# Patient Record
Sex: Male | Born: 1960 | Race: White | Hispanic: No | Marital: Married | Smoking: Former smoker
Health system: Southern US, Community
[De-identification: ages and names within clinical notes are randomized; demographics above are authoritative.]

## PROBLEM LIST (undated history)

## (undated) DIAGNOSIS — E785 Hyperlipidemia, unspecified: Secondary | ICD-10-CM

## (undated) DIAGNOSIS — J309 Allergic rhinitis, unspecified: Secondary | ICD-10-CM

## (undated) DIAGNOSIS — M1990 Unspecified osteoarthritis, unspecified site: Secondary | ICD-10-CM

## (undated) DIAGNOSIS — I2699 Other pulmonary embolism without acute cor pulmonale: Secondary | ICD-10-CM

## (undated) DIAGNOSIS — N400 Enlarged prostate without lower urinary tract symptoms: Secondary | ICD-10-CM

## (undated) DIAGNOSIS — T4145XA Adverse effect of unspecified anesthetic, initial encounter: Secondary | ICD-10-CM

## (undated) DIAGNOSIS — N289 Disorder of kidney and ureter, unspecified: Secondary | ICD-10-CM

## (undated) DIAGNOSIS — M545 Low back pain, unspecified: Secondary | ICD-10-CM

## (undated) DIAGNOSIS — E663 Overweight: Secondary | ICD-10-CM

## (undated) DIAGNOSIS — I10 Essential (primary) hypertension: Secondary | ICD-10-CM

## (undated) DIAGNOSIS — K3184 Gastroparesis: Secondary | ICD-10-CM

## (undated) DIAGNOSIS — K219 Gastro-esophageal reflux disease without esophagitis: Secondary | ICD-10-CM

## (undated) DIAGNOSIS — T8859XA Other complications of anesthesia, initial encounter: Secondary | ICD-10-CM

## (undated) DIAGNOSIS — L509 Urticaria, unspecified: Secondary | ICD-10-CM

## (undated) DIAGNOSIS — E119 Type 2 diabetes mellitus without complications: Secondary | ICD-10-CM

## (undated) DIAGNOSIS — Z794 Long term (current) use of insulin: Secondary | ICD-10-CM

## (undated) DIAGNOSIS — R51 Headache: Secondary | ICD-10-CM

## (undated) DIAGNOSIS — G63 Polyneuropathy in diseases classified elsewhere: Secondary | ICD-10-CM

## (undated) DIAGNOSIS — E349 Endocrine disorder, unspecified: Secondary | ICD-10-CM

## (undated) DIAGNOSIS — M542 Cervicalgia: Secondary | ICD-10-CM

## (undated) DIAGNOSIS — Q603 Renal hypoplasia, unilateral: Secondary | ICD-10-CM

## (undated) DIAGNOSIS — E1149 Type 2 diabetes mellitus with other diabetic neurological complication: Secondary | ICD-10-CM

## (undated) DIAGNOSIS — I739 Peripheral vascular disease, unspecified: Secondary | ICD-10-CM

## (undated) DIAGNOSIS — M797 Fibromyalgia: Secondary | ICD-10-CM

## (undated) DIAGNOSIS — Z973 Presence of spectacles and contact lenses: Secondary | ICD-10-CM

## (undated) DIAGNOSIS — I639 Cerebral infarction, unspecified: Secondary | ICD-10-CM

## (undated) DIAGNOSIS — Z87442 Personal history of urinary calculi: Secondary | ICD-10-CM

## (undated) DIAGNOSIS — N183 Chronic kidney disease, stage 3 unspecified: Secondary | ICD-10-CM

## (undated) DIAGNOSIS — IMO0001 Reserved for inherently not codable concepts without codable children: Secondary | ICD-10-CM

## (undated) DIAGNOSIS — I209 Angina pectoris, unspecified: Secondary | ICD-10-CM

## (undated) HISTORY — DX: Hyperlipidemia, unspecified: E78.5

## (undated) HISTORY — PX: SHOULDER ARTHROSCOPY W/ ROTATOR CUFF REPAIR: SHX2400

## (undated) HISTORY — DX: Essential (primary) hypertension: I10

## (undated) HISTORY — DX: Other pulmonary embolism without acute cor pulmonale: I26.99

## (undated) HISTORY — DX: Type 2 diabetes mellitus without complications: E11.9

## (undated) HISTORY — PX: OTHER SURGICAL HISTORY: SHX169

## (undated) HISTORY — PX: APPENDECTOMY: SHX54

## (undated) HISTORY — DX: Overweight: E66.3

## (undated) HISTORY — DX: Reserved for inherently not codable concepts without codable children: IMO0001

## (undated) HISTORY — DX: Urticaria, unspecified: L50.9

## (undated) HISTORY — DX: Allergic rhinitis, unspecified: J30.9

## (undated) HISTORY — PX: NISSEN FUNDOPLICATION: SHX2091

## (undated) HISTORY — DX: Gastro-esophageal reflux disease without esophagitis: K21.9

## (undated) HISTORY — DX: Type 2 diabetes mellitus with other diabetic neurological complication: E11.49

## (undated) HISTORY — DX: Low back pain: M54.5

## (undated) HISTORY — DX: Long term (current) use of insulin: Z79.4

## (undated) HISTORY — DX: Low back pain, unspecified: M54.50

## (undated) HISTORY — DX: Gastroparesis: K31.84

## (undated) HISTORY — DX: Cervicalgia: M54.2

---
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
## 1997-02-03 HISTORY — PX: BACK SURGERY: SHX140

## 1999-02-14 ENCOUNTER — Encounter: Payer: Self-pay | Admitting: Neurosurgery

## 1999-02-14 ENCOUNTER — Ambulatory Visit (HOSPITAL_COMMUNITY): Admission: RE | Admit: 1999-02-14 | Discharge: 1999-02-14 | Payer: Self-pay | Admitting: Neurosurgery

## 1999-03-06 ENCOUNTER — Encounter: Payer: Self-pay | Admitting: Neurosurgery

## 1999-03-06 ENCOUNTER — Ambulatory Visit (HOSPITAL_COMMUNITY): Admission: RE | Admit: 1999-03-06 | Discharge: 1999-03-06 | Payer: Self-pay | Admitting: Neurosurgery

## 1999-03-20 ENCOUNTER — Encounter: Payer: Self-pay | Admitting: Neurosurgery

## 1999-03-22 ENCOUNTER — Encounter: Payer: Self-pay | Admitting: Neurosurgery

## 1999-03-22 ENCOUNTER — Inpatient Hospital Stay (HOSPITAL_COMMUNITY): Admission: RE | Admit: 1999-03-22 | Discharge: 1999-03-24 | Payer: Self-pay | Admitting: Neurosurgery

## 2000-10-22 ENCOUNTER — Ambulatory Visit (HOSPITAL_COMMUNITY): Admission: RE | Admit: 2000-10-22 | Discharge: 2000-10-22 | Payer: Self-pay | Admitting: Internal Medicine

## 2001-10-20 ENCOUNTER — Encounter: Admission: RE | Admit: 2001-10-20 | Discharge: 2001-10-20 | Payer: Self-pay | Admitting: Preventative Medicine

## 2001-10-20 ENCOUNTER — Encounter: Payer: Self-pay | Admitting: Preventative Medicine

## 2002-01-12 ENCOUNTER — Encounter: Payer: Self-pay | Admitting: Neurosurgery

## 2002-01-13 ENCOUNTER — Inpatient Hospital Stay (HOSPITAL_COMMUNITY): Admission: RE | Admit: 2002-01-13 | Discharge: 2002-01-15 | Payer: Self-pay | Admitting: Neurosurgery

## 2002-11-22 ENCOUNTER — Ambulatory Visit (HOSPITAL_COMMUNITY): Admission: RE | Admit: 2002-11-22 | Discharge: 2002-11-22 | Payer: Self-pay | Admitting: Otolaryngology

## 2002-11-22 ENCOUNTER — Encounter: Payer: Self-pay | Admitting: Otolaryngology

## 2002-12-16 ENCOUNTER — Encounter: Admission: RE | Admit: 2002-12-16 | Discharge: 2002-12-16 | Payer: Self-pay | Admitting: Neurosurgery

## 2003-10-05 DIAGNOSIS — R079 Chest pain, unspecified: Secondary | ICD-10-CM | POA: Insufficient documentation

## 2003-11-01 ENCOUNTER — Ambulatory Visit (HOSPITAL_COMMUNITY): Admission: RE | Admit: 2003-11-01 | Discharge: 2003-11-01 | Payer: Self-pay | Admitting: Cardiology

## 2004-12-08 ENCOUNTER — Emergency Department (HOSPITAL_COMMUNITY): Admission: EM | Admit: 2004-12-08 | Discharge: 2004-12-08 | Payer: Self-pay | Admitting: Emergency Medicine

## 2006-03-10 ENCOUNTER — Ambulatory Visit: Payer: Self-pay | Admitting: Cardiology

## 2006-08-20 ENCOUNTER — Emergency Department (HOSPITAL_COMMUNITY): Admission: EM | Admit: 2006-08-20 | Discharge: 2006-08-21 | Payer: Self-pay | Admitting: Emergency Medicine

## 2006-12-24 ENCOUNTER — Ambulatory Visit: Payer: Self-pay | Admitting: Pulmonary Disease

## 2006-12-28 ENCOUNTER — Ambulatory Visit: Admission: RE | Admit: 2006-12-28 | Discharge: 2006-12-28 | Payer: Self-pay | Admitting: Pulmonary Disease

## 2007-01-04 ENCOUNTER — Telehealth (INDEPENDENT_AMBULATORY_CARE_PROVIDER_SITE_OTHER): Payer: Self-pay | Admitting: *Deleted

## 2007-01-07 ENCOUNTER — Encounter: Payer: Self-pay | Admitting: Pulmonary Disease

## 2007-01-08 ENCOUNTER — Ambulatory Visit: Payer: Self-pay | Admitting: Pulmonary Disease

## 2007-01-14 ENCOUNTER — Encounter (INDEPENDENT_AMBULATORY_CARE_PROVIDER_SITE_OTHER): Payer: Self-pay | Admitting: *Deleted

## 2007-01-24 ENCOUNTER — Emergency Department (HOSPITAL_COMMUNITY): Admission: EM | Admit: 2007-01-24 | Discharge: 2007-01-24 | Payer: Self-pay | Admitting: Emergency Medicine

## 2007-01-26 ENCOUNTER — Ambulatory Visit: Payer: Self-pay | Admitting: Pulmonary Disease

## 2007-05-21 ENCOUNTER — Emergency Department (HOSPITAL_COMMUNITY): Admission: EM | Admit: 2007-05-21 | Discharge: 2007-05-22 | Payer: Self-pay | Admitting: Emergency Medicine

## 2007-05-23 ENCOUNTER — Emergency Department (HOSPITAL_COMMUNITY): Admission: EM | Admit: 2007-05-23 | Discharge: 2007-05-24 | Payer: Self-pay | Admitting: Emergency Medicine

## 2007-05-30 ENCOUNTER — Inpatient Hospital Stay (HOSPITAL_COMMUNITY): Admission: EM | Admit: 2007-05-30 | Discharge: 2007-06-01 | Payer: Self-pay | Admitting: Emergency Medicine

## 2007-05-30 ENCOUNTER — Ambulatory Visit: Payer: Self-pay | Admitting: Internal Medicine

## 2007-05-31 ENCOUNTER — Ambulatory Visit: Payer: Self-pay | Admitting: Internal Medicine

## 2007-07-05 ENCOUNTER — Emergency Department (HOSPITAL_COMMUNITY): Admission: EM | Admit: 2007-07-05 | Discharge: 2007-07-05 | Payer: Self-pay | Admitting: Emergency Medicine

## 2007-08-31 ENCOUNTER — Emergency Department (HOSPITAL_COMMUNITY): Admission: EM | Admit: 2007-08-31 | Discharge: 2007-08-31 | Payer: Self-pay | Admitting: Emergency Medicine

## 2008-12-05 ENCOUNTER — Ambulatory Visit: Payer: Self-pay | Admitting: Cardiology

## 2009-12-10 ENCOUNTER — Encounter: Payer: Self-pay | Admitting: Cardiology

## 2009-12-12 ENCOUNTER — Ambulatory Visit: Payer: Self-pay | Admitting: Cardiology

## 2009-12-12 ENCOUNTER — Encounter: Payer: Self-pay | Admitting: Cardiology

## 2009-12-13 ENCOUNTER — Encounter: Payer: Self-pay | Admitting: Cardiology

## 2010-01-14 ENCOUNTER — Ambulatory Visit: Payer: Self-pay | Admitting: Cardiology

## 2010-01-14 DIAGNOSIS — K219 Gastro-esophageal reflux disease without esophagitis: Secondary | ICD-10-CM | POA: Insufficient documentation

## 2010-03-07 NOTE — Consult Note (Signed)
Summary: CARDIOLOGY CONSULT/ [facility]/ [facility]   Imported By: Delfino Lovett 01/14/2010 11:00:42  _____________________________________________________________________  External Attachment:    Type:   Image     Comment:   External Document

## 2010-03-07 NOTE — Letter (Signed)
Summary: [facility] D/C DR. HOWARD  [facility] D/C DR. HOWARD   Imported By: Delfino Lovett 01/14/2010 13:37:21  _____________________________________________________________________  External Attachment:    Type:   Image     Comment:   External Document
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
## 2010-06-18 NOTE — Procedures (Signed)
NAME:  [name], [name]                  ACCOUNT NO.:  x   MEDICAL RECORD NO.:  [id number]          PATIENT TYPE:  OUT   LOCATION:  SLEEP LAB                     FACILITY:  [facility]   PHYSICIAN:  Kathee Delton, MD,FCCPDATE OF BIRTH:  01[birth year]19/60   DATE OF STUDY:  12/28/2006                            NOCTURNAL POLYSOMNOGRAM   REFERRING PHYSICIAN:  Kathee Delton, MD,FCCP   INDICATION FOR STUDY:  Hypersomnia with sleep apnea.   EPWORTH SLEEPINESS SCORE:  13   MEDICATIONS:   SLEEP ARCHITECTURE:  The patient had a total sleep time of 323 minutes  with decreased slow wave sleep as well as REM.  Sleep onset latency was  normal, and REM onset was mildly prolonged.  Sleep efficiency was  decreased at 87%.   RESPIRATORY DATA:  The patient was found to have two obstructive apneas  and 18 hypopneas for an apnea/hypopnea index of 3.7 events per hour.  The events occurred more commonly during REM, and there was loud snoring  noted throughout.  The events were not positional.   OXYGEN DATA:  There was O2 desaturation as low as 88% with the patient's  obstructive events.   CARDIAC DATA:  No clinically significant arrhythmias were noted.   MOVEMENT-PARASOMNIA:  None.   IMPRESSIONS-RECOMMENDATIONS:  1. Small numbers of obstructive events which do not meet the      apnea/hypopnea index criteria for the obstructive sleep apnea      syndrome.  2. No movement disorder of sleep or abnormal behaviors were noted      throughout the night.     Kathee Delton, MD,FCCP  Diplomate, [place] Board of Sleep  Medicine  Electronically Signed    [facility]/MED[facility]  D:  01/09/2007 12:13:27  T:  01/10/2007 10:11:57  Job:  [id number]

## 2010-06-18 NOTE — H&P (Signed)
[name], [name]              ACCOUNT NO.:  [id number]   MEDICAL RECORD NO.:  [id number]          PATIENT TYPE:  INP   LOCATION:  A302                          FACILITY:  [facility]   PHYSICIAN:  Marlene Bast, MDDATE OF BIRTH:  July 22, 1960   DATE OF ADMISSION:  05/29/2007  DATE OF DISCHARGE:  LH                              HISTORY & PHYSICAL   CHIEF COMPLAINT:  Vomiting blood.   HISTORY OF PRESENT ILLNESS:  [name] is a 50 year old gentleman,  who presented to the emergency department after vomiting blood 3 times  on the day of arrival.  He actually vomited blood even one time the day  before that.  He also has had trouble with intermittent abdominal pain  for about 3 weeks.  He denies any diarrhea or constipation.  No bright  red blood per rectum, no fevers, no chest pain, and no shortness of  breath.  The abdominal pain is midline, high in the abdomen, almost in  the epigastric area, in the area of scar that he had from his previous  acid reflux surgery that is where the pain is most of time, it is  intermittent and not constant.  He also has some pain in the lower  abdomen at times.   PAST MEDICAL HISTORY:  Significant for diabetes, hypertension, obesity,  and hyperlipidemia.  He has history of surgery for severe acid reflux  approximately 6 years ago;  that was at [facility] in [place], and  he has history of back surgery at [facility] in 2003 and that was for L4-L5  lumbar spondylolisthesis with radiculopathy.  He had a cardiac  catheterization in September 2005, which revealed no significant  coronary artery disease.  The patient also tells me he had a sleep  study, which was negative for any sleep apnea.   MEDICATIONS ON ARRIVAL PER THE ED RECORDS:  Include:  1. Crestor once daily.  2. Allegra once daily.  3. Actos twice daily.  4. Metformin twice daily.  5. Lisinopril/hydrochlorothiazide and insulin, though we do not know      the doses.   FAMILY HISTORY:   Significant for hypertension.   SOCIAL HISTORY:  He does not smoke cigarettes, drink alcohol, or use any  illicit drugs.   REVIEW OF SYSTEMS:  CONSTITUTIONAL:  He denies any high fevers or night  sweats.  HEENT:  No significant headaches or sore throat.  CARDIOVASCULAR:  No chest pain or lower extremity edema.  RESPIRATORY:  He denies any shortness of breath or wheezing.  No hemoptysis.  GI:  He  has vomited blood 4 times and has the abdominal pain, but no diarrhea or  constipation.  GU:  He denies any urinary difficulty.  NEUROLOGIC:  He  denies any paresthesias, any asymmetric weakness, no seizures.  All  other systems reviewed and are negative.   PHYSICAL EXAMINATION:  In the emergency department, his temperature was  99.0, blood pressure 131/86, pulse 89, respiratory rate 20, and O2  saturation is 96% on room air.  On physical examination, the patient is in no acute distress.  HEENT Exam:  Normocephalic and atraumatic.  His pupils are equal and  round.  Sclerae nonicteric.  Oral mucosa moist.  NECK: Supple.  No lymphadenopathy, no thyromegaly, no jugular venous  distention.  CARDIOVASCULAR Examination: Regular rate and rhythm.  No murmurs are  appreciated.  LUNGS: Clear to auscultation with no wheezes, rhonchi, or rales.  ABDOMEN: Obese and nontender at the time of my examination.  No  hepatosplenomegaly.  He does have a vertical scar in the epigastric  region, which is well healed, but there is some laxity in the skin  around that scar and so he may have a small hernia, but it was nontender  when I palpated the area.  He has no rebound or guarding at this point.  EXTREMITIES: Revealed no evidence of clubbing, cyanosis, or pitting  edema.  His DP pulses are faint, but palpable.  NEUROLOGIC: He is alert and oriented x3.  His cranial nerves II through  XII are intact.  Grossly, he has 5/5 strength in his upper and lower  extremities.  His sensory exam is intact grossly in his upper  and lower  extremities.  He has normal muscle tone and bulk.  MUSCULOSKELETAL: Reveals no evidence of effusion of his joints and no  synovitis.   LABORATORY DATA:  The patient's white count is 9.4, hemoglobin 14.3,  hematocrit 41.2, and platelet count is 169,000.  His INR is 1.0 and PTT  is 29.  Sodium is 136, potassium 4.3, chloride 104, bicarb 25, glucose  162, BUN 29, creatinine 1.52, AST is 34, and lipase 30.  The patient had  CT scan of his abdomen and pelvis, which revealed L4-L5 fusion hardware  and right hip degeneration with lumbar spine degenerative changes also  identified, but no acute intra-abdominal or pelvic finding.   ASSESSMENT AND PLAN:  1. Hematemesis and abdominal pain.  The patient's abdominal CT is      negative.  We will make him NPO, admit him to a medical bed and we      will consult Gastroenterology in the morning.  We will follow his      hemoglobins and do serial abdominal exams.  We will also put him on      IV Protonix.  2. For his diabetes, we will actually hold his oral agents at this      time as the patient will be NPO.  We will follow him with sliding      scale and monitor.  3. Hypertension.  We will also hold his hydrochlorothiazide and      lisinopril at this juncture until it is clearhe is not bleeding      significantly.  4. Hyperlipidemia.  We will also hold his Crestor.       Marlene Bast, MD  Electronically Signed     [facility]/MEDQ  D:  05/30/2007  T:  05/30/2007  Job:  [id number]   cc:   Rory Percy  Fax: [phone]

## 2010-06-18 NOTE — Procedures (Signed)
NAME:  [name], [name]                  ACCOUNT NO.:  x   MEDICAL RECORD NO.:  [id number]          PATIENT TYPE:  OUT   LOCATION:  SLEEP LAB                     FACILITY:  [facility]   PHYSICIAN:  Kathee Delton, MD,FCCPDATE OF BIRTH:  1960-02-09   DATE OF STUDY:  12/28/2006                            NOCTURNAL POLYSOMNOGRAM   REFERRING PHYSICIAN:  Kathee Delton, MD,FCCP   INDICATION FOR STUDY:  Hypersomnia with sleep apnea.   EPWORTH SLEEPINESS SCORE:  13   MEDICATIONS:   SLEEP ARCHITECTURE:  The patient had a total sleep time of 323 minutes  with decreased slow wave sleep as well as REM.  Sleep onset latency was  normal, and REM onset was mildly prolonged.  Sleep efficiency was  decreased at 87%.   RESPIRATORY DATA:  The patient was found to have two obstructive apneas  and 18 hypopneas for an apnea/hypopnea index of 3.7 events per hour.  The events occurred more commonly during REM, and there was loud snoring  noted throughout.  The events were not positional.   OXYGEN DATA:  There was O2 desaturation as low as 88% with the patient's  obstructive events.   CARDIAC DATA:  No clinically significant arrhythmias were noted.   MOVEMENT-PARASOMNIA:  None.   IMPRESSIONS-RECOMMENDATIONS:  1. Small numbers of obstructive events which do not meet the      apnea/hypopnea index criteria for the obstructive sleep apnea      syndrome.  2. No movement disorder of sleep or abnormal behaviors were noted      throughout the night.      Kathee Delton, MD,FCCP  Diplomate, [place] Board of Sleep  Medicine    [facility]/MEDQ  D:  01/09/2007 12:13:27  T:  01/10/2007 10:11:57  Job:  [id number]

## 2010-06-18 NOTE — Discharge Summary (Signed)
NAMEROYSTON, WALLEN              ACCOUNT NO.:  [id number]   MEDICAL RECORD NO.:  [id number]          PATIENT TYPE:  INP   LOCATION:  A302                          FACILITY:  [facility]   PHYSICIAN:  Bonnielee Haff, MD     DATE OF BIRTH:  Feb 10, 1960   DATE OF ADMISSION:  05/30/2007  DATE OF DISCHARGE:  04/28/2009LH                               DISCHARGE SUMMARY   PHYSICIANS:  The patient sees Dr. Rory Percy, I think who is in [place],  [place] as his primary doctor.  He is also followed by Dr.  Lia Foyer, cardiologist and Dr. Ronnald Collum for his diabetes.   DISCHARGE DIAGNOSES:  1. Nausea, vomiting, likely viral gastroenteritis, possible diabetic      gastroparesis, improved.  2. Type 2 diabetes and hypertension, both stable.   BRIEF HOSPITAL COURSE:  Briefly, this is a 50 year old Caucasian male  who has type 2 diabetes who also has hypertension who was in his usual  state of health to about 3 weeks ago when he started having symptoms of  nausea, vomiting.  He came in a couple of days ago when he started  throwing up blood.  He was admitted to the hospital for further  evaluation.  The patient was seen by gastroenterology, and he underwent  an EGD yesterday which showed evidence for Nissen fundoplication, normal  esophagus, some antral erosions.  Basically, no significant  abnormalities were noted on this EGD.  GI felt that he was having  protracted viral illness or food-borne illness.  Diabetic gastroparesis  was also in the differential.  The patient improved while he was  hospitalized.  He was started on a low-fat modified carbohydrate diet  yesterday and he tolerated lunch and dinner.  He is awaiting a gastric  emptying study which is to be done today after which he can go home.  Dr. Gala Romney will call him with the results of this test.  He also had an  ultrasound of his abdomen which showed fatty infiltration of the liver,  otherwise negative exam.  He also had a CT of his abdomen  when presented  which were both negative.   His diabetes and hypertension are very well-controlled.   Today he is feeling much better.  No pain, no nausea, vomiting.  Vital  signs are stable.  Examination unremarkable.  He is stable for  discharge.   I have told him that Dr. Gala Romney will call him with the results of the  GES.  He does not have to wait for the report which before he goes home.   DISCHARGE MEDICATIONS:  No new medications added.  He can continue:  1. Crestor 40 mg daily.  2. Allegra 180 mg daily.  3. Actos 15 mg twice daily.  4. Metformin 850 mg twice daily.  5. Byetta twice daily.  6. Lisinopril HCTZ once daily.   FOLLOWUP:  Follow up with his PMD in one week, with Dr. Gala Romney as per Dr.  Gala Romney.   DISCHARGE INSTRUCTIONS:  1. Diet low fat carbohydrate modified diet.  2. Physical activity:  No restrictions.   TOTAL TIME  ON DISCHARGE ENCOUNTER:  Thirty five minutes.      Bonnielee Haff, MD  Electronically Signed     GK/MEDQ  D:  06/01/2007  T:  06/01/2007  Job:  [id number]   cc:   R. Garfield Cornea, M.D.  [address]  [place]  [place] [address]   Kevin Howard  Fax: [phone]

## 2010-06-18 NOTE — Op Note (Signed)
NAME:  [name], [name]              ACCOUNT NO.:  [id number]   MEDICAL RECORD NO.:  [id number]          PATIENT TYPE:  INP   LOCATION:  A302                          FACILITY:  [facility]   PHYSICIAN:  R. Garfield Cornea, M.D. DATE OF BIRTH:  16-Apr-1960   DATE OF PROCEDURE:  05/31/2007  DATE OF DISCHARGE:                               OPERATIVE REPORT   INDICATIONS FOR PROCEDURE:  A 46-year gentleman with nausea,  hematemesis, and a self-limiting diarrhea of 3 weeks' duration.  EGD is  now being done to further evaluate hematemesis.  He has remained  hemodynamically stable, hemoglobin remains normal.  Gallbladder  ultrasound earlier today demonstrated normal-appearing gallbladder and  biliary tree with some fatty changes in the liver.  Hemoglobin A1c is  7.6.  This approach has been discussed with the patient and the  patient's wife.  Risks, benefits, alternatives, and limitations have  been reviewed, questions answered, and all parties agreeable.   PROCEDURE NOTE:  O2 saturation, blood pressure, pulse, and respiration  were monitored through the entire procedure.  Conscious sedation, Versed  6 mg IV, Demerol 150 mg IV in divided doses, and Phenergan 25 mg diluted  slow IV push to augment conscious sedation.  Cetacaine spray for topical  pharyngeal anesthesia.   INSTRUMENT:  Pentax video chip system.   FINDINGS:  Examination of the tubular esophagus revealed entirely normal-  appearing esophageal mucosa.  EG junction was easily traversed, and the  stomach and gastric cavity was emptied and insufflated well with air.  Thorough examination of the gastric mucosa including retroflexed view of  the proximal stomach esophagogastric junction demonstrated only a couple  of antral erosions and an intact Nissen fundoplication only.  Pylorus  was patent, easily traversed.  Examination of the bulb and second  portion revealed no abnormalities.   THERAPEUTIC/DIAGNOSTIC MANEUVERS PERFORMED:  None.  The  patient  tolerated the procedure well and was reacted in endoscopy.   IMPRESSION:  1. Normal esophagus.  2. Intact Nissen fundoplication.  3. Couple of antral erosions otherwise normal stomach, patent pylorus,      and normal D1 and D2.   A SPECT revealed upper gastrointestinal bleed.  Furthermore, I suspect  recent viral or foodborne illness.  He may have an element of  gastroparesis.   RECOMMENDATIONS:  1. Low-fat diabetic diet.  2. Solid-phase gastric emptying study June 01, 2007.  3. Maximize glycemic control.      Bridgette Habermann, M.D.  Electronically Signed     RMR/MEDQ  D:  05/31/2007  T:  05/31/2007  Job:  [id number]   cc:   Loretha Brasil. Lia Foyer, MD, [place]. [address]  [address]  [place]  [place] [address]   Lenard Simmer, M.D.  Fax: [place]  Fax: [phone]

## 2010-06-18 NOTE — Consult Note (Signed)
NAME:  [name]              ACCOUNT NO.:  [id number]   MEDICAL RECORD NO.:  [id number]          PATIENT TYPE:  INP   LOCATION:  A302                          FACILITY:  [facility]   PHYSICIAN:  R. Garfield Cornea, M.D. DATE OF BIRTH:  04-23-1960   DATE OF CONSULTATION:  05/30/2007  DATE OF DISCHARGE:                                 CONSULTATION   REASON FOR CONSULTATION:  Abdominal pain, hematemesis.   HISTORY OF PRESENT ILLNESS:  Mr. [name] is a pleasant 4-year-  old Caucasian male resident of [place] with longstanding insulin dependent  diabetes mellitus admitted to the hospital last evening with a 3 week  history of postprandial abdominal pain, nausea and vomiting.  He was in  his usual state of fairly good health without any GI symptoms until  about 3 weeks ago when he felt acute onset of nausea, vomiting and  nonbloody watery diarrhea.  The diarrhea lasted about 4 days and then  subsided.  Since that time he has had postprandial retro xiphoid  abdominal pain with frequent heaving.  He rarely ever has much in his  emesis status post laparoscopic Nissen fundoplication by Dr. Burke [name] in [place] 5-6 years ago.  He has had persistent postprandial retro  xiphoid pain along with heaving over the past 3 weeks.  He brought up  some fresh blood with heaving last evening.  He was seen in our ED twice  earlier in the past 10 days and on his third visit last evening he was  admitted for further evaluation.   In the ED he was evaluated by Dr. Nira [name].  He underwent a CT of  abdomen and pelvis (which I personally reviewed with Dr. Lambert [name]).  CT of the abdomen and pelvis demonstrates no inflammatory or neoplastic  process that would explain his symptoms.  There is stigmata of intact  Nissen fundoplication.  The wrap appears to be intact and there is no  evidence of herniation.  Laboratory data on admission demonstrated  lipase of 30.  His LFTs were completely normal.  He was  somewhat volume  contracted with a BUN of 29 and creatinine of 1.52.  His white count was  7.8, H and H 13.6 and 38.9.   Overnight he has been treated with IV fluids, antiemetics, analgesics  and continues to have some retro xiphoid pain this morning.  His white  count this morning is normal at 7.8.  His H and H has gone from 14.3 and  41.2 on April 25 to 13.6 and 38.9 this morning.  MCV is 81.0, platelet  count 155,000.   [name] denies fever, chills at home.  He has not had any  hematochezia or melena.  In fact in the emergency department he had  brown stool which was hemoccult negative by Dr. Arty [name] exam.  Mr.  [name] underwent a lap Nissen but Dr. Anthony [name] 6 years ago for reflux.  He has had an excellent result.  He denies having any reflux symptoms  subsequent to his surgery.  He has required no acid suppression therapy.  He has  not had any odynophagia or dysphagia but he really has not been  able to effectively vomit since his laparoscopic Nissen fundoplication.   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  His  last EGD was done by Dr. Gar [name] 4 years ago per his report.  He  tells me he sees Dr. Ronnald [name] for his diabetes and his diabetes has been  under suboptimal control with hemoglobin A1c in the 8 range.  His  gallbladder remains in situ.   PAST MEDICAL HISTORY:  Significant for what sounds like acute coronary  syndrome.  He was transferred from this facility to see Dr. Lia [name] some  8 years ago and underwent percutaneous intervention down at [facility]  and he denies having an MI or to getting a stent.  He has a history of  hypertension, hyperlipidemia and diabetes mellitus.   PAST SURGICAL HISTORY:  Past surgeries include back surgery x3,  appendectomy, laparoscopic Nissen fundoplication, multiple EGDs in the  past.   OUTPATIENT MEDICATIONS:  1. Crestor.  2. Allegra.  3. Actos.  4. Metformin.  5. Insulin.  6. Lisinopril.    ALLERGIES:  No known drug allergies.   FAMILY HISTORY:  Negative for chronic GI or liver illness.  There is no  history of GI malignancy.   SOCIAL HISTORY:  The patient is married, has two grown children in good  health.  He does not smoke or use alcohol.  He is disabled because of  his back problems.   REVIEW OF SYSTEMS:  Has not had any chest pain or dyspnea on exertion  recently.  No fever or chills.  No clay colored stools or dark colored  urine.  He denies yellow jaundice.   PHYSICAL EXAMINATION:  GENERAL:  Reveals a pleasant 50 year old  gentleman resting comfortably in the hospital bed.  Room 302,  accompanied by his wife.  VITAL SIGNS:  Temperature 98.3, pulse 57, respiratory rate 20, BP  113/73.  SKIN:  Warm and dry.  There is no jaundice or stigmata of chronic liver  disease.  HEENT:  No scleral icterus.  Conjunctivae pink.  Oral cavity benign.  No  lesions.  CHEST:  Lungs are clear to auscultation.  COR:  Regular rate and rhythm without murmur, gallop or rub.  ABDOMEN:  Full, positive bowel sounds.  No succussion splash.  He does  have significant tenderness just under his xiphoid process to palpation  which extends into the right upper quadrant.  I do not appreciate a mass  or hepatosplenomegaly.  EXTREMITIES:  No edema.   ADDITIONAL LABORATORY DATA:  His BMET is improved today with a sodium  136, potassium 4.3, chloride 101, CO2 27, glucose 123, BUN 21,  creatinine 1.0.  Lipase normal at 30.  SGOT, SGPT 34 and 39  respectively.  Albumin 4.1, bilirubin 0.5.  Urinalysis negative except  for ketones.   IMPRESSION:  Mr. [name] is a pleasant 50 year old gentleman  with longstanding diabetes somewhat poorly controlled admitted to the  hospital with a 3 week GI illness characterized by nausea, some vomiting  and self limiting nonbloody watery diarrhea early on in the course of  illness.  He has had some blood in his vomitus last evening.  He has had   significant postprandial abdominal pain during this illness as well.  Thus far, it appears that Mr. [name] has had a relatively trivial  gastrointestinal bleed.   I do suspect a food-borne illness as  the precipitating cause of his  symptoms but now would wonder about postinfectious gastroparesis playing  a role.  Significant abdominal pain is typically not present with  gastroparesis alone.   He has heaved rather severely intermittently over the past 3 weeks.  Would have to wonder about slippage of his Nissen and the possibility of  paraesophageal hernia, etc.  However, there is no evidence of anything  like that on his recent CT.   We also need to be concerned about the possibility of occult gallbladder  disease in evolution.   RECOMMENDATIONS:  1. Agree with symptomatic treatment as you are providing.  Agree with      proton pump inhibitor therapy empirically.  2. Will plan for a diagnostic EGD on May 31, 2007.  The risks,      benefits, alternatives and limitations have been reviewed with Mr.      Arntzen and his wife, questions answered, all parties are      agreeable.  3. Will also pursue a gallbladder ultrasound to evaluate further for      possible occult gallbladder disease on May 31, 2007.   Further recommendations to follow.   I would like to thank the hospitalist team for allowing me to see this  nice gentleman today.      Bridgette Habermann, M.D.  Electronically Signed     RMR/MEDQ  D:  05/30/2007  T:  05/30/2007  Job:  [id number]   cc:   Rory Percy  [phone]   Lenard Simmer, M.D.  Fax: [place] Dustin Foyer, MD, [place]. [place]  [place] [address]

## 2010-06-21 NOTE — Discharge Summary (Signed)
   NAME:  [name], [name]                        ACCOUNT NO.:  [id number]   MEDICAL RECORD NO.:  [id number]                   PATIENT TYPE:  INP   LOCATION:  3012                                 FACILITY:  [facility]   PHYSICIAN:  Earleen Newport, M.D.               DATE OF BIRTH:  1960/12/07   DATE OF ADMISSION:  01/13/2002  DATE OF DISCHARGE:  01/15/2002                                 DISCHARGE SUMMARY   ADMISSION DIAGNOSIS:  Lumbar spondylolisthesis L4-L5 with lumbar  radiculopathy.   DISCHARGE DIAGNOSIS:  Lumbar spondylolisthesis L4-L5 with lumbar  radiculopathy.   FINAL DIAGNOSIS:  Lumbar spondylolisthesis L4-L5 with lumbar radiculopathy.   OPERATION:  Lumbar decompression L4-L5, posterolateral and interbody  arthrodesis with pedicle screw fixation L4-L5.   CONDITION ON DISCHARGE:  Improving.   HOSPITAL COURSE:  The patient is a 50 year old individual who has had  significant back and lower extremity pain and had previous diskectomy at the  L4-L5 level.  He has some degenerative listhesis.  He was advised regarding  surgery which included a laminectomy and interbody fusion at L4-L5.  He  tolerated the procedure well.  He was mobilized on the second postoperative  day.  The patient is a large individual weighing in excess of 300 pounds at  6 feet 2 inches.  He required some help with physical therapy to mobilize  adequately, however.  At the time of discharge his incision is clean and  dry, he is independent with his activities, he is discharged home with the  use of a walker and given prescriptions for Percocet (#60) without refills,  Valium 5 mg (#40) without refills and Keflex 500 mg one four times a day for  10 days.  He will be seen in 2 weeks' time by Dr. Joya Salm for further  followup.                                               Earleen Newport, M.D.    Drucilla Schmidt  D:  01/15/2002  T:  01/16/2002  Job:  [id number]

## 2010-06-21 NOTE — Op Note (Signed)
[place]  Patient:    [name], [name]                     MRN: [id number] Proc. Date: 03/22/99 Adm. Date:  [id number] Attending:  Minda Meo                           Operative Report  PREOPERATIVE DIAGNOSIS:  Right L4-L5, L3-L4 herniated disk.  POSTOPERATIVE DIAGNOSIS: 1. Right L4-L5 herniated disk. 2. Right L3-L4 stenosis.  OPERATION: 1. Right L3-L4 foraminotomy. 2. Right L4-L5 diskectomy. 3. Decompression of the L3, L4, and L5 nerve root.  MICROSCOPE:  Midas Rex.  SURGEON:  Zigmund Daniel. Joya Salm, M.D.  ASSISTANT:  Elizabeth Sauer, M.D.  CLINICAL HISTORY:  Mr. 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  For the past few months, he had been complaining of pain down to the  right leg.  X-rays showed that he has degenerative disk disease at the level of 3-4 and 4-5.  There was a disk at the level of L4 a little difficult to see if it was coming from 4-5 going up or from 3-4 going below.  He failed conservative treatment.  Because of that, surgery was advised.  The patient knew of the risks such as infection, CSF leak, worsening of pain, and need for further surgery.  DESCRIPTION OF PROCEDURE:  The patient was taken to the operating room, and he as positioned and put on monitors.  This gentleman is 6 feet 5 inches, and he is 280 pounds.  The previous scar was resected.  Dissection was carried in the midline. At the end, we were able to see the disk at the midline.  This gentleman, since the time we did the laminectomy, the lamina has overgrown almost touching each other.  X-rays showed that indeed we were at the level of 3-4.  From then on using the [place], we removed lamina of the L3, L4, and part of the upper part of L5. Preservation of facet was done.  At the level of 3-4, we found a lot of scar tissue with a  bulging disk but no evidence of any herniated disk.  There was narrowing of the canal, and a foraminotomy was accomplished.  The L3 and L4 nerve roots were left with plenty of space.  Then our attention was at the level of 4-5.  We found a lot of scar tissue with  some yellow ligament.  Dissection was done.  Lysis of adhesions was achieved. t the end, we were able to visualize the L5 nerve root.  There was some herniated  disk going laterally.  Removal was done.  There was also quite a bit of scar tissue which also was removed.  We entered the disk space, and total diskectomy with removal a large amount of degenerative disk was done.  Then we had to do lysis f adhesions and finally we were able to retract the dural sac.  We found mostly bulging disk, but there was no evidence of any large fragment in the body of L4. ______ below was done.  Having total diskectomy and having plenty of room for the L3, L4, and L5 nerve root, the area was irrigated.  Valsalva maneuver was negative. Fentanyl and Depo-Medrol were left in the epidural space, and the wound was closed with Vicryl and  nylon. DD:  03/22/99 TD:  03/23/99 Job: [id number]

## 2010-06-21 NOTE — H&P (Signed)
[name], [name]                          ACCOUNT NO.:  [id number]   MEDICAL RECORD NO.:  [id number]                   PATIENT TYPE:   LOCATION:                                       FACILITY:   PHYSICIAN:  Leeroy Cha, M.D.                DATE OF BIRTH:  1961-01-27   DATE OF ADMISSION:  01/13/2002  DATE OF DISCHARGE:                                HISTORY & PHYSICAL   HISTORY OF PRESENT ILLNESS:  The patient is a gentleman who underwent lumbar  laminectomy for a stenosis.  This was done back in 1997.  I have not seen  the patient for many years until he came last month complaining of back pain  radiating to both legs and sometimes the pain is so intense that he has to  stop.  When he works he has to do heavy lifting, and lately this had been  getting worse and worse.  He was said to go to the neurosurgeon in the area  who advised he needed back surgery, and because of that he was sent to [place]  for further treatment.   PAST MEDICAL HISTORY:  Lumbar laminectomy.   ALLERGIES:  No known drug allergies.   HABITS:  The patient does not smoke.  He drinks socially.   FAMILY HISTORY:  Unremarkable.   PHYSICAL EXAMINATION:  GENERAL:  The patient came to my office, as he had  complaints of discomfort.  He was having difficulty sitting and standing.  HEENT:  Normal.  NECK:  Normal.  LUNGS:  Clear.  HEART:  Normal.  Normal femoral pulses.  NEUROLOGIC:  Normal.  Strength:  There was some weakness of both dorsi  flexion of both feet.  Reflexes are symmetrical, 1+, no Babinski.  Sensation:  He complains of numbness on top of both feet.  The straight leg  raising is positive bilateral 45 degrees.  He has decrease of flexibility of  the lumbar spine.   LABORATORY DATA:  The x-rays show that he has a lumbar laminectomy at 3, 4,  and 5, but below 4-5 he has spondylolisthesis which is worse with __________  present.   IMPRESSION:  Lumbar spondylolisthesis at L4-5.   RECOMMENDATIONS:  I talked to the patient and his wife.  I fully agree that  this patient is going to need L4-5 diskectomy, interbody fusion, __________,  and posterior lateral fusion.  The surgery was explained to him and his  wife, including the risks such as no improvement whatsoever, need for  further surgery, infection, CSF leak, damage to the vessels of the abdomen,  and failure of the bone graft and the plate.  Leeroy Cha, M.D.    EB/MEDQ  D:  01/12/2002  T:  01/12/2002  Job:  [id number]

## 2010-06-21 NOTE — Cardiovascular Report (Signed)
NAMEZEVIN, INGHAM              ACCOUNT NO.:  [id number]   MEDICAL RECORD NO.:  [id number]          PATIENT TYPE:  OIB   LOCATION:  2899                         FACILITY:  [facility]   PHYSICIAN:  Loretha Brasil. Lia Foyer, M.D. [facility] OF BIRTH:  May 25, 1960   DATE OF PROCEDURE:  11/01/2003  DATE OF DISCHARGE:                              CARDIAC CATHETERIZATION   INDICATIONS:  This is a very nice gentleman who has had a prior unremarkable  cath according to history who presented with nonspecific electrocardiogram.  He has continued to have some arm and leg numbness and burning.  Symptoms  were fel to be atypical.  He also has hypertension and dyslipidemia.  He was  seen by Dr. Domenic Polite and subsequently set up for a repeat cardiac  catheterization.  Risks, benefits, and alternatives were discussed with the  patient in detail.  He was agreeable to proceed.   PROCEDURES:  1.  Left heart catheterization.  2.  Selective coronary arteriography.  3.  Selective left ventriculography.   DESCRIPTION OF PROCEDURE:  The patient was brought to the catheterization  lab and prepped and draped in the usual fashion.  Through an anterior  puncture, the  right femoral artery was easily entered and a 6 French sheath  was placed.  Views of the left and right coronary arteries were obtained in  multiple angiographic projections.  Central aortic and left ventricular  pressures were measured with a pigtail.  Ventriculography was performed in  the RAO projection.  There were no complications.  He was taken to the  holding area in satisfactory clinical condition.   HEMODYNAMIC DATA:  1.  Central aorta:  134/86, mean 106.  2.  Left ventricle:  144/18.  3.  No gradient on pullback across the aortic valve.   ANGIOGRAPHIC DATA:  1.  Ventriculography was performed in the RAO projection.  Overall systolic      function was preserved.  Ejection fraction was calculated at 67%.  There      did appear to be left  ventricular hypertrophy.  2.  The left main was large caliber vessel that is free of critical disease.  3.  The LAD courses to the apex and wraps the apical tip and provides the      distal inferior wall.  There are two smaller diagonal branches which are      free of critical disease.  4.  There is a circumflex intermediate vessel.  The vessel has minimal      luminal irregularity proximally, but no significant focal area of      disease.  5.  The circumflex is a dominant vessel.  It provides the posterior section      of the heart.  Other than minimal luminal irregularity, no high grade      areas of disease are noted.  6.  The right is a nondominant vessel.  No critical areas of stenosis are      noted.   CONCLUSION:  1.  Well-preserved overall left ventricular function.  2.  No evidence of critical coronary obstruction.   RECOMMENDATIONS:  Aggressive risk factor management with weight reduction,  dyslipidemia and diabetes management.       TDS/MEDQ  D:  11/01/2003  T:  11/01/2003  Job:  [id number]   cc:   Satira Sark, M.D. [facility]   Rory Percy  [address] [place]. [place] [address]  Fax: [phone]

## 2010-06-21 NOTE — Op Note (Signed)
NAME:  [name], [name]                        ACCOUNT NO.:  [id number]   MEDICAL RECORD NO.:  [id number]                   PATIENT TYPE:  INP   LOCATION:  3012                                 FACILITY:  [facility]   PHYSICIAN:  Leeroy Cha, M.D.                DATE OF BIRTH:  [birth year]   DATE OF PROCEDURE:  01/12/2002  DATE OF DISCHARGE:                                 OPERATIVE REPORT   PREOPERATIVE DIAGNOSIS:  L4-5 spondylolisthesis and status post lumbar  laminectomy for lumbar stenosis.   POSTOPERATIVE DIAGNOSIS:  L4-5 spondylolisthesis and status post lumbar  laminectomy for lumbar stenosis.   PROCEDURE:  Bilateral L4-5 diskectomy, interbody fusion with allograft,  pedicle screw L4, L5, posterolateral fusion.  Cell Saver.  C-arm.   SURGEON:  Leeroy Cha, M.D.   ASSISTANTS:  Marchia Meiers. Vertell Limber, M.D., Otilio Connors, M.D.   CLINICAL HISTORY:  The patient was admitted because of back pain with  radiation down to both legs.  The patient had lumbar laminectomy for  stenosis many years ago.  X-rays showed that he has spondylolisthesis  between 4-5.  Surgery was advised.  The risks were explained in the history  and physical.   DESCRIPTION OF PROCEDURE:  The patient was taken to the OR and after  intubation was positioned in a prone manner.  The patient is 6 feet 2 inches  and almost 300 pounds.  The previous scar tissue was resected, and we  carried our incision in the midline until we were able to find the lamina as  well as the facet.  Having _______ with the C-arm, we were able to identify  the area between 4-5.  The laminae were removed prior; nevertheless, they  were overgrown, almost touching in the midline.  Removal was made and the  bilateral ____ was accomplished.  We went laterally, and we removed the  facet at 4-5.  We were able to find the L4 and L5 nerve root, which were  really swollen and with quite a bit of scar tissue.  Lysis was accomplished  bilaterally.   At the end we entered the disk space.  This disk space was  quite narrow.  We finally after we did a total gross diskectomy were able to  insert a spreader.  Then, having done this, we were able to work on the  opposite side, where total diskectomy was accomplished.  We introduced a  curette, and finally we were able to introduce bone graft of 10 x 24.  These  were done bilaterally.  Using the patient's bone graft, we filled up the  disk space medial and laterally.  Then we identified the pedicle screws at  L4 and L5 on both sides.  We introduced the pedicle probe, followed by the  tap.  At the end we introduced a pedicle screw of 6.5 x 15 mm length.  Prior  to that we  went into the laminar process.  We removed the periosteum.  We  filled up this area using the patient's own bone.  The pedicle screws were  followed by a rod with caps.  This was tightened in place.  A link was used  between the left and the right side of the rod.  The AP and lateral view  showed good position of the pedicle screw and the graft.  While the graft in  the body once started going straight, it twisted itself about 10 degrees.  It was difficult to clear out, and we left this allograft in place.  Then  the area was irrigated.  Because the patient had quite a bit of scar tissue,  we worried about the possibility of any CSF,  and we used Tisseel to fill up the epidural space.  At end we have good  decompression with plenty of space for the L4 and L5 nerve root.  From then  on the wound was closed with Vicryl and the skin with a Steri-Strip.  During  the procedure, Dr. Luiz Ochoa helped with the pedicle screws and Dr. Vertell Limber  helped at the beginning and the end of the case.                                               Leeroy Cha, M.D.    EB/MEDQ  D:  01/12/2002  T:  01/13/2002  Job:  [id number]

## 2010-06-28 DIAGNOSIS — R0789 Other chest pain: Secondary | ICD-10-CM

## 2010-07-01 ENCOUNTER — Ambulatory Visit (HOSPITAL_COMMUNITY)
Admission: AD | Admit: 2010-07-01 | Discharge: 2010-07-02 | Disposition: A | Discharge status: Home / Self Care | Payer: Medicare HMO | Source: Other Acute Inpatient Hospital | Attending: Cardiology | Admitting: Cardiology

## 2010-07-01 DIAGNOSIS — E669 Obesity, unspecified: Secondary | ICD-10-CM | POA: Insufficient documentation

## 2010-07-01 DIAGNOSIS — E119 Type 2 diabetes mellitus without complications: Secondary | ICD-10-CM | POA: Insufficient documentation

## 2010-07-01 DIAGNOSIS — Z86711 Personal history of pulmonary embolism: Secondary | ICD-10-CM | POA: Insufficient documentation

## 2010-07-01 DIAGNOSIS — I200 Unstable angina: Secondary | ICD-10-CM

## 2010-07-01 DIAGNOSIS — Z794 Long term (current) use of insulin: Secondary | ICD-10-CM | POA: Insufficient documentation

## 2010-07-01 DIAGNOSIS — R079 Chest pain, unspecified: Principal | ICD-10-CM | POA: Insufficient documentation

## 2010-07-01 DIAGNOSIS — K3184 Gastroparesis: Secondary | ICD-10-CM | POA: Insufficient documentation

## 2010-07-02 ENCOUNTER — Telehealth: Payer: Self-pay | Admitting: *Deleted

## 2010-07-02 DIAGNOSIS — I2510 Atherosclerotic heart disease of native coronary artery without angina pectoris: Secondary | ICD-10-CM

## 2010-07-02 DIAGNOSIS — R072 Precordial pain: Secondary | ICD-10-CM

## 2010-07-02 LAB — GLUCOSE, CAPILLARY
Glucose-Capillary: 145 mg/dL — ABNORMAL HIGH (ref 70–99)
Glucose-Capillary: 133 mg/dL — ABNORMAL HIGH (ref 70–99)

## 2010-07-02 LAB — CBC
MCH: 26.6 pg (ref 26.0–34.0)
MCHC: 32.5 g/dL (ref 30.0–36.0)
MCV: 81.8 fL (ref 78.0–100.0)
Platelets: 127 10*3/uL — ABNORMAL LOW (ref 150–400)
RDW: 14.2 % (ref 11.5–15.5)

## 2010-07-02 LAB — BASIC METABOLIC PANEL
CO2: 29 mEq/L (ref 19–32)
Chloride: 102 mEq/L (ref 96–112)
GFR calc Af Amer: >60 mL/min (ref >60)
Sodium: 139 mEq/L (ref 135–145)

## 2010-07-02 NOTE — Telephone Encounter (Signed)
Called pt at home number listed on [facility]'s records [phone]. Left message to call back on voicemail. (Also attempted to reach pt at cell number listed in our records but no answer and voicemail hasn't been set up yet.)   Per Gene, pt needs outpatient JV cath following d/c from [facility] this weekend.   Cath scheduled and pt needs to be notified of information.

## 2010-07-02 NOTE — Telephone Encounter (Signed)
Checking percert for catherization set for 07-04-2010

## 2010-07-02 NOTE — Telephone Encounter (Signed)
Received call from [place], [place] that cath was done at [facility] this weekend. Cath cancelled.

## 2010-07-03 NOTE — Telephone Encounter (Signed)
thanks

## 2010-07-03 NOTE — Telephone Encounter (Signed)
AUTH # [id number] LEFT HEART CATH 07/02/10 DR. [place] PATIENT WENT IN THROUGH ED. SENT TO CATH LAB CASE WAS RETRO-AUTHORIZED WITH [employer]

## 2010-07-04 LAB — GLUCOSE, CAPILLARY: Glucose-Capillary: 121 mg/dL — ABNORMAL HIGH (ref 70–99)

## 2010-07-05 NOTE — Discharge Summary (Addendum)
[name], [name]              ACCOUNT NO.:  [id number]  MEDICAL RECORD NO.:  [id number]           PATIENT TYPE:  I  LOCATION:  [id number]                         FACILITY:  [place]  PHYSICIAN:  Nakiesha Rumsey M. Martinique, M.D.  DATE OF BIRTH:  05-Dec-1960  DATE OF ADMISSION:  07/01/2010 DATE OF DISCHARGE:  07/02/2010                              DISCHARGE SUMMARY   DISCHARGE DIAGNOSES: 1. Chest pain.     a.     Negative cardiac enzymes. 2. Normal coronary artery by catheterization on Jul 02, 2010, with     normal left ventricular function, ejection fraction of 60%. 3. Insulin-dependent diabetes mellitus. 4. History of dyslipidemia. 5. Multiple preceding stress tests prior to catheterization.     a.     False positive exercise Cardiolite in 2005, suggestive of      anterior basal inferior apical ischemia, prompting cardiac      catheterization at that time.     b.     Normal dobutamine stress echo in November 2011. 6. History of small left lower lobe pulmonary embolism in 2009,     treated with anticoagulation. 7. Gastroparesis. 8. Obesity.  HOSPITAL COURSE:  [name] is a 50 year old gentleman with a history of diabetes and hyperlipidemia, who presented to [facility] with complaints of chest pain.  He has had several previous evaluations including most recent dobutamine stress test in November 2011.  He has been doing fairly well until the past 2-3 weeks, complaining of intermittent chest discomfort with or without exertion, sharp in quality with radiation in both upper extremities, occasionally associated with diaphoresis, dyspnea, nausea.  On arrival to the ER, he had a blood pressure of 152/77 with a pulse of 73 and was afebrile.  He was treated with 4 baby aspirin, 1 sublingual nitro and then placed on nitro paste. His chest pain was 2/10 by the time of evaluation with Cardiology. Initial set of cardiac markers showed a CPK of 395 with an MB of 7.8 and troponin of less than  0.01.  Admission EKG showed normal sinus rhythm with 74 beats per minute with a first-degree AV block, normal axis, and nonspecific ST-T changes.  There was felt to be no strict correlation with exertion with his symptoms, and possibly with an atypical component to his symptoms.  However, given multiple stress tests, all of which have been negative, there were features of his story consistent with angina.  He was transferred to [facility] for cardiac catheterization and placed on IV heparin.  The patient underwent cardiac catheterization by Dr. Martinique today which demonstrated normal coronary artery with an EF of 60%.  Dr. Martinique has seen and examined him today and feels he is stable for discharge.  DISCHARGE LABORATORY:  WBC 5.2, hemoglobin 14, hematocrit 43.1, platelet count 127.  Sodium 139, potassium 4.1, chloride 102, CO2 of 29, glucose 148, BUN 13, creatinine 0.84.  STUDIES:  Cardiac catheterization on Jul 02, 2010, please see full report for details.  DISCHARGE MEDICATIONS: 1. Aspirin 81 mg daily. 2. Humalog 20 units subcutaneously t.i.d. 3. Levemir 50 units subcutaneously t.i.d. 4. Loratadine 10 mg daily.  5. Zyrtec 10 mg daily.  Please note, the patient was instructed that     both Zyrtec and Claritin are within the same class and are not     meant to be taken together, and he is to talk with his PCP about     which medicine he should continue regularly/ 6. Metformin 500 mg at bedtime, instruction is not to start until July 05, 2010. 7. Neurontin 800 mg 3 tablets daily at bedtime.  DISPOSITION:  [name] will be discharged in stable condition to home.  He is instructed to follow a heart-healthy, diabetic diet, not to lift anything over 5 pounds for 1 week, drive for 2 days, or participate in sexual activity for 1 week.  He is to call or return for pain, swelling, bleeding, or pus at his cath site.  He will follow up with Dr. Ron Parker on July 22, 2010, at 10:45  a.m. for his post-cath check to make sure his procedure site has healed well.  From then on, he may only need to see [place] p.r.n.  DURATION OF DISCHARGE ENCOUNTER:  Greater than 30 minutes including physician and PA time.     Melina Copa, P.A.C.   ______________________________ Madhavi Hamblen M. Martinique, M.D.    DD/MEDQ  D:  07/02/2010  T:  07/03/2010  Job:  [id number]  cc:   Carlena Bjornstad, MD, [facility] Dr. Brigitte Pulse  Electronically Signed by Melina Copa  on 07/05/2010 01:48:10 PM Electronically Signed by Rakeen Gaillard Martinique M.D. on 07/10/2010 06:06:18 PM

## 2010-07-10 NOTE — Cardiovascular Report (Signed)
  NAMEMAXTIN, [name]              ACCOUNT NO.:  [id number]  MEDICAL RECORD NO.:  [id number]           PATIENT TYPE:  I  LOCATION:  [id number]                         FACILITY:  [place]  PHYSICIAN:  Nysia Dell M. [name], M.D.  DATE OF BIRTH:  Oct 11, 1960  DATE OF PROCEDURE:  07/02/2010 DATE OF DISCHARGE:  07/02/2010                           CARDIAC CATHETERIZATION   INDICATIONS FOR PROCEDURE:  A 50 year old white male with history of insulin-dependent diabetes mellitus, hypertension, mixed dyslipidemia, and obesity who presents with symptoms of protracted chest pain.  He has ruled out for myocardial infarction.  PROCEDURES:  Left heart catheterization, coronary and left ventricular angiography.  ACCESS:  Via the right radial artery using the standard Seldinger technique.  EQUIPMENT:  5-French 4 cm right and left Judkins catheter, 5-French pigtail catheter, 5-French arterial sheath.  MEDICATIONS:  Local anesthesia 1% Xylocaine, Versed 2 mg IV, fentanyl 25 mcg IV, verapamil 3 mg intra-arterial, and heparin 5000 units IV.  CONTRAST:  90 mL of Omnipaque.  HEMODYNAMIC DATA:  Aortic pressure is 127/85 with a mean of 104 mmHg, left ventricular pressure is 127 with an EDP of 18 mmHg.  ANGIOGRAPHIC DATA:  The right coronary artery arises somewhat anteriorly.  It is a small nondominant vessel and is normal.  The left main coronary artery is normal.  The left anterior descending artery is a large vessel and is normal.  There is a moderate ramus intermediate branch which is normal.  Left circumflex coronary artery is a dominant vessel.  There is minor narrowing of the third obtuse marginal vessel up to 40%.  Otherwise, the left circumflex system appears normal.  Left ventricular angiography performed in RAO view demonstrates normal left ventricular size and contractility with ejection fraction estimated at 60%.  FINAL INTERPRETATION: 1. No significant obstructive atherosclerotic coronary  artery disease. 2. Normal left ventricular function.          ______________________________ [name], M.D.     PMJ/MEDQ  D:  07/02/2010  T:  07/03/2010  Job:  [id number]  cc:   Monico Blitz, MD Carlena Bjornstad, MD, [facility]  Electronically Signed by Jersee Winiarski [name] M.D. on 07/10/2010 06:06:12 PM

## 2010-07-15 ENCOUNTER — Encounter: Payer: Self-pay | Admitting: Cardiology

## 2010-07-19 ENCOUNTER — Encounter: Payer: Self-pay | Admitting: Cardiology

## 2010-07-19 DIAGNOSIS — E1159 Type 2 diabetes mellitus with other circulatory complications: Secondary | ICD-10-CM | POA: Insufficient documentation

## 2010-07-19 DIAGNOSIS — E785 Hyperlipidemia, unspecified: Secondary | ICD-10-CM | POA: Insufficient documentation

## 2010-07-19 DIAGNOSIS — I2699 Other pulmonary embolism without acute cor pulmonale: Secondary | ICD-10-CM | POA: Insufficient documentation

## 2010-07-19 DIAGNOSIS — I10 Essential (primary) hypertension: Secondary | ICD-10-CM | POA: Insufficient documentation

## 2010-07-19 DIAGNOSIS — E669 Obesity, unspecified: Secondary | ICD-10-CM | POA: Insufficient documentation

## 2010-07-19 DIAGNOSIS — K3184 Gastroparesis: Secondary | ICD-10-CM | POA: Insufficient documentation

## 2010-07-22 ENCOUNTER — Encounter: Payer: Medicare HMO | Admitting: Cardiology

## 2010-07-23 ENCOUNTER — Encounter: Payer: Self-pay | Admitting: *Deleted

## 2010-10-29 LAB — BASIC METABOLIC PANEL
CO2: 25
CO2: 27
CO2: 27
Calcium: 9.3
Chloride: 101
Chloride: 103
Creatinine, Ser: 1
Creatinine, Ser: 1.02
Creatinine, Ser: 1.1
GFR calc Af Amer: >60
GFR calc Af Amer: >60
Glucose, Bld: 137 — ABNORMAL HIGH
Potassium: 4.2
Potassium: 4.3
Sodium: 134 — ABNORMAL LOW

## 2010-10-29 LAB — DIFFERENTIAL
Basophils Absolute: 0.1
Basophils Absolute: 0.1
Basophils Relative: 1
Basophils Relative: 1
Eosinophils Absolute: 0.1
Eosinophils Absolute: 0.2
Eosinophils Absolute: 0.2
Eosinophils Relative: 1
Lymphocytes Relative: 33
Lymphs Abs: 1.7
Monocytes Absolute: 0.5
Monocytes Absolute: 0.7
Monocytes Relative: 11
Neutro Abs: 4
Neutro Abs: 4.4
Neutrophils Relative %: 50
Neutrophils Relative %: 56
Neutrophils Relative %: 77
Neutrophils Relative %: 56

## 2010-10-29 LAB — COMPREHENSIVE METABOLIC PANEL
ALT: 39
Alkaline Phosphatase: 74
BUN: 18
CO2: 25
CO2: 23
Calcium: 9.1
Chloride: 104
Chloride: 102
Creatinine, Ser: 1.17
GFR calc non Af Amer: 49 — ABNORMAL LOW
GFR calc non Af Amer: >60
Glucose, Bld: 162 — ABNORMAL HIGH
Potassium: 4.3
Sodium: 136
Total Bilirubin: 0.5
Total Bilirubin: 0.8

## 2010-10-29 LAB — CBC
HCT: 38.9 — ABNORMAL LOW
HCT: 39.6
HCT: 40.9
Hemoglobin: 13.6
Hemoglobin: 13.8
Hemoglobin: 13.8
Hemoglobin: 14.3
MCHC: 34.7
MCHC: 34.8
MCHC: 34.9
MCHC: 35.5
MCHC: 35.3
MCV: 81
MCV: 82.3
MCV: 82.1
RBC: 4.8
RBC: 4.81
RBC: 5.09
RBC: 4.98
RDW: 13.7
WBC: 5.1
WBC: 7.8
WBC: 9.4
WBC: 7.8

## 2010-10-29 LAB — URINALYSIS, ROUTINE W REFLEX MICROSCOPIC
Bilirubin Urine: NEGATIVE
Bilirubin Urine: NEGATIVE
Glucose, UA: NEGATIVE
Hgb urine dipstick: NEGATIVE
Hgb urine dipstick: NEGATIVE
Protein, ur: NEGATIVE
Protein, ur: NEGATIVE
Specific Gravity, Urine: 1.028
Urobilinogen, UA: 0.2

## 2010-10-29 LAB — HEMOGLOBIN AND HEMATOCRIT, BLOOD: HCT: 38.6 — ABNORMAL LOW

## 2010-10-29 LAB — LIPASE, BLOOD
Lipase: 30
Lipase: 32

## 2010-10-29 LAB — HEPATIC FUNCTION PANEL
AST: 42 — ABNORMAL HIGH
Bilirubin, Direct: 0.1

## 2010-10-29 LAB — ABO/RH: ABO/RH(D): O POS

## 2010-10-29 LAB — PROTIME-INR: Prothrombin Time: 13.3

## 2010-11-01 LAB — URINALYSIS, ROUTINE W REFLEX MICROSCOPIC
Glucose, UA: >1000 — AB
Hgb urine dipstick: NEGATIVE
Ketones, ur: >80 — AB
Protein, ur: NEGATIVE

## 2010-11-01 LAB — CBC
Hemoglobin: 15.2
RBC: 5.43
RDW: 13.1
WBC: 9.9

## 2010-11-01 LAB — COMPREHENSIVE METABOLIC PANEL
ALT: 33
Alkaline Phosphatase: 123 — ABNORMAL HIGH
CO2: 23
Chloride: 100
Glucose, Bld: 334 — ABNORMAL HIGH
Potassium: 4.6
Sodium: 134 — ABNORMAL LOW
Total Protein: 7.3

## 2010-11-01 LAB — DIFFERENTIAL
Basophils Relative: 1
Eosinophils Absolute: 0.1
Monocytes Relative: 5
Neutrophils Relative %: 82 — ABNORMAL HIGH

## 2010-11-18 LAB — BASIC METABOLIC PANEL
BUN: 26 — ABNORMAL HIGH
CO2: 19
Chloride: 101
Glucose, Bld: 201 — ABNORMAL HIGH
Potassium: 4.2

## 2010-11-18 LAB — DIFFERENTIAL
Basophils Absolute: 0
Eosinophils Absolute: 0.1
Eosinophils Relative: 1

## 2010-11-18 LAB — CBC
HCT: 42.6
MCV: 83.8
Platelets: 178
RDW: 13.9

## 2010-11-18 LAB — STOOL CULTURE

## 2011-02-04 HISTORY — PX: ACHILLES TENDON REPAIR: SUR1153

## 2011-06-05 ENCOUNTER — Other Ambulatory Visit (HOSPITAL_COMMUNITY): Payer: Medicare HMO

## 2011-06-12 ENCOUNTER — Ambulatory Visit: Admit: 2011-06-12 | Payer: Medicare HMO | Admitting: Podiatry

## 2011-06-12 SURGERY — EXCISION, EXOSTOSIS
Anesthesia: General | Laterality: Right

## 2012-08-31 DIAGNOSIS — R079 Chest pain, unspecified: Secondary | ICD-10-CM

## 2012-09-06 DIAGNOSIS — R079 Chest pain, unspecified: Secondary | ICD-10-CM

## 2012-09-28 ENCOUNTER — Telehealth: Payer: Self-pay | Admitting: Gastroenterology

## 2012-09-28 ENCOUNTER — Encounter: Payer: Self-pay | Admitting: Gastroenterology

## 2012-09-28 NOTE — Telephone Encounter (Signed)
Started prior authorization for pantoprazole. Faxed forms to [facility]

## 2012-09-28 NOTE — Telephone Encounter (Signed)
Faxed additional info form back to [facility] for prior auth for pantoprazole

## 2012-10-13 ENCOUNTER — Other Ambulatory Visit: Payer: Self-pay | Admitting: Neurosurgery

## 2012-10-13 DIAGNOSIS — M5416 Radiculopathy, lumbar region: Secondary | ICD-10-CM

## 2012-10-20 ENCOUNTER — Ambulatory Visit
Admission: RE | Admit: 2012-10-20 | Discharge: 2012-10-20 | Disposition: A | Discharge status: Home / Self Care | Payer: Medicare Other | Source: Ambulatory Visit | Attending: Neurosurgery | Admitting: Neurosurgery

## 2012-10-20 DIAGNOSIS — M5416 Radiculopathy, lumbar region: Secondary | ICD-10-CM

## 2012-10-20 MED ORDER — GADOBENATE DIMEGLUMINE 529 MG/ML IV SOLN
20.0000 mL | Freq: Once | INTRAVENOUS | Status: AC | PRN
  Administered 2012-10-20: 20 mL via INTRAVENOUS

## 2013-01-10 ENCOUNTER — Encounter (HOSPITAL_COMMUNITY): Payer: Self-pay | Admitting: Pharmacy Technician

## 2013-01-10 ENCOUNTER — Other Ambulatory Visit: Payer: Self-pay | Admitting: Neurosurgery

## 2013-01-11 ENCOUNTER — Encounter (HOSPITAL_COMMUNITY): Payer: Self-pay | Admitting: *Deleted

## 2013-01-11 NOTE — Progress Notes (Signed)
01/11/13 1711  OBSTRUCTIVE SLEEP APNEA  Have you ever been diagnosed with sleep apnea through a sleep study? No  If yes, do you have and use a CPAP or BPAP machine every night? 0  Do you snore loudly (loud enough to be heard through closed doors)?  1  Do you often feel tired, fatigued, or sleepy during the daytime? 1  Has anyone observed you stop breathing during your sleep? 0  Do you have, or are you being treated for high blood pressure? 0  BMI more than 35 kg/m2? 1  Age over 54 years old? 1  Gender: 1  Obstructive Sleep Apnea Score 5  Score 4 or greater  Results sent to PCP

## 2013-01-11 NOTE — Progress Notes (Signed)
Patient states he does not see a Cardiologist, he did have a stress test 7 or more.  Patient denies any chest pain or shortness of breath.  Mr Molloy reports that he was instructed by Dr Harley [facility] office to arrive at 1430 and that he should not eat anything after 0700.

## 2013-01-12 ENCOUNTER — Ambulatory Visit (HOSPITAL_COMMUNITY): Payer: Medicare Other

## 2013-01-12 ENCOUNTER — Encounter (HOSPITAL_COMMUNITY): Payer: Medicare Other | Admitting: Anesthesiology

## 2013-01-12 ENCOUNTER — Inpatient Hospital Stay (HOSPITAL_COMMUNITY)
Admission: RE | Admit: 2013-01-12 | Discharge: 2013-01-13 | DRG: 520 | Disposition: A | Discharge status: Home / Self Care | Payer: Medicare Other | Source: Ambulatory Visit | Attending: Neurosurgery | Admitting: Neurosurgery

## 2013-01-12 ENCOUNTER — Encounter (HOSPITAL_COMMUNITY)
Admission: RE | Disposition: A | Discharge status: Home / Self Care | Payer: Self-pay | Source: Ambulatory Visit | Attending: Neurosurgery

## 2013-01-12 ENCOUNTER — Ambulatory Visit (HOSPITAL_COMMUNITY): Payer: Medicare Other | Admitting: Anesthesiology

## 2013-01-12 ENCOUNTER — Encounter (HOSPITAL_COMMUNITY): Payer: Self-pay | Admitting: *Deleted

## 2013-01-12 DIAGNOSIS — E782 Mixed hyperlipidemia: Secondary | ICD-10-CM | POA: Diagnosis present

## 2013-01-12 DIAGNOSIS — Z86711 Personal history of pulmonary embolism: Secondary | ICD-10-CM

## 2013-01-12 DIAGNOSIS — Z794 Long term (current) use of insulin: Secondary | ICD-10-CM

## 2013-01-12 DIAGNOSIS — E348 Other specified endocrine disorders: Secondary | ICD-10-CM | POA: Diagnosis present

## 2013-01-12 DIAGNOSIS — E119 Type 2 diabetes mellitus without complications: Secondary | ICD-10-CM | POA: Diagnosis present

## 2013-01-12 DIAGNOSIS — G589 Mononeuropathy, unspecified: Secondary | ICD-10-CM | POA: Diagnosis present

## 2013-01-12 DIAGNOSIS — I10 Essential (primary) hypertension: Secondary | ICD-10-CM | POA: Diagnosis present

## 2013-01-12 DIAGNOSIS — Z981 Arthrodesis status: Secondary | ICD-10-CM

## 2013-01-12 DIAGNOSIS — M48061 Spinal stenosis, lumbar region without neurogenic claudication: Principal | ICD-10-CM | POA: Diagnosis present

## 2013-01-12 DIAGNOSIS — Z87891 Personal history of nicotine dependence: Secondary | ICD-10-CM

## 2013-01-12 DIAGNOSIS — I739 Peripheral vascular disease, unspecified: Secondary | ICD-10-CM | POA: Diagnosis present

## 2013-01-12 HISTORY — DX: Headache: R51

## 2013-01-12 HISTORY — DX: Endocrine disorder, unspecified: E34.9

## 2013-01-12 HISTORY — DX: Peripheral vascular disease, unspecified: I73.9

## 2013-01-12 HISTORY — DX: Endocrine disorder, unspecified: G63

## 2013-01-12 HISTORY — PX: LUMBAR LAMINECTOMY/DECOMPRESSION MICRODISCECTOMY: SHX5026

## 2013-01-12 LAB — CBC
Hemoglobin: 14.5 g/dL (ref 13.0–17.0)
MCH: 29.2 pg (ref 26.0–34.0)
MCHC: 34.5 g/dL (ref 30.0–36.0)
MCV: 84.5 fL (ref 78.0–100.0)
RBC: 4.97 MIL/uL (ref 4.22–5.81)
RDW: 14.5 % (ref 11.5–15.5)

## 2013-01-12 LAB — BASIC METABOLIC PANEL
BUN: 20 mg/dL (ref 6–23)
Chloride: 106 mEq/L (ref 96–112)
Creatinine, Ser: 1.06 mg/dL (ref 0.50–1.35)
GFR calc Af Amer: >90 mL/min (ref >90)
GFR calc non Af Amer: 78 mL/min — ABNORMAL LOW (ref >90)
Glucose, Bld: 181 mg/dL — ABNORMAL HIGH (ref 70–99)

## 2013-01-12 LAB — SURGICAL PCR SCREEN
MRSA, PCR: NEGATIVE
Staphylococcus aureus: NEGATIVE

## 2013-01-12 LAB — GLUCOSE, CAPILLARY
Glucose-Capillary: 142 mg/dL — ABNORMAL HIGH (ref 70–99)
Glucose-Capillary: 194 mg/dL — ABNORMAL HIGH (ref 70–99)

## 2013-01-12 SURGERY — LUMBAR LAMINECTOMY/DECOMPRESSION MICRODISCECTOMY 1 LEVEL
Anesthesia: General | Laterality: Right

## 2013-01-12 MED ORDER — HEMOSTATIC AGENTS (NO CHARGE) OPTIME
TOPICAL | Status: DC | PRN
  Administered 2013-01-12: 1 via TOPICAL

## 2013-01-12 MED ORDER — MAGNESIUM HYDROXIDE 400 MG/5ML PO SUSP: 30.0000 mL | Freq: Every day | ORAL | Status: DC | PRN

## 2013-01-12 MED ORDER — DIAZEPAM 5 MG PO TABS
ORAL_TABLET | ORAL | Status: AC
  Filled 2013-01-12: qty 2

## 2013-01-12 MED ORDER — SODIUM CHLORIDE 0.9 % IJ SOLN
3.0000 mL | Freq: Two times a day (BID) | INTRAMUSCULAR | Status: DC
  Administered 2013-01-13: 3 mL via INTRAVENOUS

## 2013-01-12 MED ORDER — ACETAMINOPHEN 325 MG PO TABS: 650.0000 mg | ORAL_TABLET | ORAL | Status: DC | PRN

## 2013-01-12 MED ORDER — DIPHENHYDRAMINE HCL 12.5 MG/5ML PO ELIX
12.5000 mg | ORAL_SOLUTION | Freq: Four times a day (QID) | ORAL | Status: DC | PRN
  Filled 2013-01-12: qty 5

## 2013-01-12 MED ORDER — OXYCODONE HCL 5 MG PO TABS
ORAL_TABLET | ORAL | Status: AC
  Filled 2013-01-12: qty 1

## 2013-01-12 MED ORDER — SODIUM CHLORIDE 0.9 % IV SOLN: 250.0000 mL | INTRAVENOUS | Status: DC

## 2013-01-12 MED ORDER — ATORVASTATIN CALCIUM 20 MG PO TABS
20.0000 mg | ORAL_TABLET | Freq: Every day | ORAL | Status: DC
Start: 2013-01-12 — End: 2013-01-13
  Administered 2013-01-12 – 2013-01-13 (×2): 20 mg via ORAL
  Filled 2013-01-12 (×2): qty 1

## 2013-01-12 MED ORDER — LACTATED RINGERS IV SOLN
INTRAVENOUS | Status: DC
  Administered 2013-01-12: 14:00:00 via INTRAVENOUS

## 2013-01-12 MED ORDER — MIDAZOLAM HCL 5 MG/5ML IJ SOLN
INTRAMUSCULAR | Status: DC | PRN
  Administered 2013-01-12 (×2): 1 mg via INTRAVENOUS

## 2013-01-12 MED ORDER — INSULIN ASPART 100 UNIT/ML ~~LOC~~ SOLN
0.0000 [IU] | Freq: Three times a day (TID) | SUBCUTANEOUS | Status: DC
  Administered 2013-01-13 (×2): 7 [IU] via SUBCUTANEOUS

## 2013-01-12 MED ORDER — ROCURONIUM BROMIDE 100 MG/10ML IV SOLN
INTRAVENOUS | Status: DC | PRN
  Administered 2013-01-12: 20 mg via INTRAVENOUS
  Administered 2013-01-12 (×2): 10 mg via INTRAVENOUS
  Administered 2013-01-12: 50 mg via INTRAVENOUS

## 2013-01-12 MED ORDER — SODIUM CHLORIDE 0.9 % IJ SOLN: 9.0000 mL | INTRAMUSCULAR | Status: DC | PRN

## 2013-01-12 MED ORDER — ONDANSETRON HCL 4 MG/2ML IJ SOLN
INTRAMUSCULAR | Status: DC | PRN
  Administered 2013-01-12: 4 mg via INTRAVENOUS

## 2013-01-12 MED ORDER — NALOXONE HCL 0.4 MG/ML IJ SOLN: 0.4000 mg | INTRAMUSCULAR | Status: DC | PRN

## 2013-01-12 MED ORDER — GLYCOPYRROLATE 0.2 MG/ML IJ SOLN
INTRAMUSCULAR | Status: DC | PRN
  Administered 2013-01-12: .8 mg via INTRAVENOUS

## 2013-01-12 MED ORDER — ONDANSETRON HCL 4 MG/2ML IJ SOLN: 4.0000 mg | Freq: Four times a day (QID) | INTRAMUSCULAR | Status: DC | PRN

## 2013-01-12 MED ORDER — DIAZEPAM 5 MG PO TABS
10.0000 mg | ORAL_TABLET | Freq: Four times a day (QID) | ORAL | Status: DC | PRN
  Administered 2013-01-12: 10 mg via ORAL

## 2013-01-12 MED ORDER — GABAPENTIN 800 MG PO TABS: 2400.0000 mg | ORAL_TABLET | Freq: Every day | ORAL | Status: DC

## 2013-01-12 MED ORDER — CEFAZOLIN SODIUM-DEXTROSE 2-3 GM-% IV SOLR
INTRAVENOUS | Status: AC
  Administered 2013-01-12: 2 g via INTRAVENOUS
  Filled 2013-01-12: qty 50

## 2013-01-12 MED ORDER — HYDROMORPHONE HCL PF 1 MG/ML IJ SOLN
INTRAMUSCULAR | Status: AC
  Filled 2013-01-12: qty 1

## 2013-01-12 MED ORDER — HYDROMORPHONE HCL PF 1 MG/ML IJ SOLN
0.2500 mg | INTRAMUSCULAR | Status: DC | PRN
  Administered 2013-01-12 (×6): 0.5 mg via INTRAVENOUS

## 2013-01-12 MED ORDER — GABAPENTIN 400 MG PO CAPS
2400.0000 mg | ORAL_CAPSULE | Freq: Every day | ORAL | Status: DC
  Administered 2013-01-12: 2400 mg via ORAL
  Filled 2013-01-12 (×2): qty 6

## 2013-01-12 MED ORDER — ACETAMINOPHEN 650 MG RE SUPP: 650.0000 mg | RECTAL | Status: DC | PRN

## 2013-01-12 MED ORDER — METFORMIN HCL 500 MG PO TABS
1000.0000 mg | ORAL_TABLET | Freq: Two times a day (BID) | ORAL | Status: DC
  Administered 2013-01-13: 1000 mg via ORAL
  Filled 2013-01-12 (×3): qty 2

## 2013-01-12 MED ORDER — ONDANSETRON HCL 4 MG/2ML IJ SOLN
INTRAMUSCULAR | Status: AC
  Administered 2013-01-12: 4 mg
  Filled 2013-01-12: qty 2

## 2013-01-12 MED ORDER — BUPIVACAINE LIPOSOME 1.3 % IJ SUSP
20.0000 mL | INTRAMUSCULAR | Status: DC
  Filled 2013-01-12: qty 20

## 2013-01-12 MED ORDER — MORPHINE SULFATE (PF) 1 MG/ML IV SOLN
INTRAVENOUS | Status: DC
  Administered 2013-01-12: 6 mg via INTRAVENOUS
  Administered 2013-01-12: 22:00:00 via INTRAVENOUS
  Administered 2013-01-13: 1.5 mg via INTRAVENOUS

## 2013-01-12 MED ORDER — SODIUM CHLORIDE 0.9 % IJ SOLN: 3.0000 mL | INTRAMUSCULAR | Status: DC | PRN

## 2013-01-12 MED ORDER — 0.9 % SODIUM CHLORIDE (POUR BTL) OPTIME
TOPICAL | Status: DC | PRN
  Administered 2013-01-12: 1000 mL

## 2013-01-12 MED ORDER — MENTHOL 3 MG MT LOZG: 1.0000 | LOZENGE | OROMUCOSAL | Status: DC | PRN

## 2013-01-12 MED ORDER — MUPIROCIN 2 % EX OINT
TOPICAL_OINTMENT | CUTANEOUS | Status: AC
  Filled 2013-01-12: qty 22

## 2013-01-12 MED ORDER — PROPOFOL 10 MG/ML IV BOLUS
INTRAVENOUS | Status: DC | PRN
  Administered 2013-01-12: 270 mg via INTRAVENOUS

## 2013-01-12 MED ORDER — THROMBIN 5000 UNITS EX SOLR
CUTANEOUS | Status: DC | PRN
  Administered 2013-01-12 (×2): 5000 [IU] via TOPICAL

## 2013-01-12 MED ORDER — ONDANSETRON HCL 4 MG/2ML IJ SOLN: 4.0000 mg | INTRAMUSCULAR | Status: DC | PRN

## 2013-01-12 MED ORDER — LINAGLIPTIN 5 MG PO TABS
5.0000 mg | ORAL_TABLET | Freq: Every day | ORAL | Status: DC
  Administered 2013-01-13: 5 mg via ORAL
  Filled 2013-01-12: qty 1

## 2013-01-12 MED ORDER — FENTANYL CITRATE 0.05 MG/ML IJ SOLN
INTRAMUSCULAR | Status: DC | PRN
  Administered 2013-01-12 (×4): 50 ug via INTRAVENOUS
  Administered 2013-01-12: 200 ug via INTRAVENOUS
  Administered 2013-01-12 (×2): 50 ug via INTRAVENOUS

## 2013-01-12 MED ORDER — PHENOL 1.4 % MT LIQD: 1.0000 | OROMUCOSAL | Status: DC | PRN

## 2013-01-12 MED ORDER — CEFAZOLIN SODIUM 1-5 GM-% IV SOLN
1.0000 g | Freq: Three times a day (TID) | INTRAVENOUS | Status: AC
  Administered 2013-01-13 (×2): 1 g via INTRAVENOUS
  Filled 2013-01-12 (×2): qty 50

## 2013-01-12 MED ORDER — BUPIVACAINE LIPOSOME 1.3 % IJ SUSP
INTRAMUSCULAR | Status: DC | PRN
  Administered 2013-01-12: 20 mL

## 2013-01-12 MED ORDER — HYDROMORPHONE HCL PF 1 MG/ML IJ SOLN: 1.0000 mg | Freq: Once | INTRAMUSCULAR | Status: DC

## 2013-01-12 MED ORDER — ARTIFICIAL TEARS OP OINT
TOPICAL_OINTMENT | OPHTHALMIC | Status: DC | PRN
  Administered 2013-01-12: 1 via OPHTHALMIC

## 2013-01-12 MED ORDER — INSULIN ASPART 100 UNIT/ML ~~LOC~~ SOLN: 0.0000 [IU] | Freq: Every day | SUBCUTANEOUS | Status: DC

## 2013-01-12 MED ORDER — OXYCODONE HCL 5 MG PO TABS
5.0000 mg | ORAL_TABLET | Freq: Once | ORAL | Status: AC | PRN
  Administered 2013-01-12: 5 mg via ORAL

## 2013-01-12 MED ORDER — GLIMEPIRIDE 2 MG PO TABS
2.0000 mg | ORAL_TABLET | Freq: Every day | ORAL | Status: DC
  Administered 2013-01-13: 2 mg via ORAL
  Filled 2013-01-12 (×2): qty 1

## 2013-01-12 MED ORDER — SODIUM CHLORIDE 0.9 % IV SOLN
INTRAVENOUS | Status: DC
  Administered 2013-01-12: via INTRAVENOUS

## 2013-01-12 MED ORDER — LIDOCAINE HCL (CARDIAC) 20 MG/ML IV SOLN
INTRAVENOUS | Status: DC | PRN
  Administered 2013-01-12: 70 mg via INTRAVENOUS

## 2013-01-12 MED ORDER — MORPHINE SULFATE (PF) 1 MG/ML IV SOLN
INTRAVENOUS | Status: AC
  Filled 2013-01-12: qty 25

## 2013-01-12 MED ORDER — LACTATED RINGERS IV SOLN
INTRAVENOUS | Status: DC | PRN
  Administered 2013-01-12 (×3): via INTRAVENOUS

## 2013-01-12 MED ORDER — NEOSTIGMINE METHYLSULFATE 1 MG/ML IJ SOLN
INTRAMUSCULAR | Status: DC | PRN
  Administered 2013-01-12: 5 mg via INTRAVENOUS

## 2013-01-12 MED ORDER — OXYCODONE-ACETAMINOPHEN 5-325 MG PO TABS
1.0000 | ORAL_TABLET | ORAL | Status: DC | PRN
  Administered 2013-01-13: 2 via ORAL
  Filled 2013-01-12: qty 2

## 2013-01-12 MED ORDER — MUPIROCIN 2 % EX OINT
TOPICAL_OINTMENT | Freq: Two times a day (BID) | CUTANEOUS | Status: DC
  Administered 2013-01-12 – 2013-01-13 (×3): via NASAL
  Filled 2013-01-12: qty 22

## 2013-01-12 MED ORDER — DIPHENHYDRAMINE HCL 50 MG/ML IJ SOLN: 12.5000 mg | Freq: Four times a day (QID) | INTRAMUSCULAR | Status: DC | PRN

## 2013-01-12 MED ORDER — ZOLPIDEM TARTRATE 5 MG PO TABS: 10.0000 mg | ORAL_TABLET | Freq: Every evening | ORAL | Status: DC | PRN

## 2013-01-12 MED ORDER — OXYCODONE HCL 5 MG/5ML PO SOLN: 5.0000 mg | Freq: Once | ORAL | Status: AC | PRN

## 2013-01-12 SURGICAL SUPPLY — 59 items
APL SKNCLS STERI-STRIP NONHPOA (GAUZE/BANDAGES/DRESSINGS) ×1
BENZOIN TINCTURE PRP APPL 2/3 (GAUZE/BANDAGES/DRESSINGS) ×2 IMPLANT
BLADE SURG ROTATE 9660 (MISCELLANEOUS) IMPLANT
BUR ACORN 6.0 (BURR) ×3 IMPLANT
BUR MATCHSTICK NEURO 3.0 LAGG (BURR) IMPLANT
CANISTER SUCT 3000ML (MISCELLANEOUS) ×2 IMPLANT
CONT SPEC 4OZ CLIKSEAL STRL BL (MISCELLANEOUS) ×2 IMPLANT
DRAPE LAPAROTOMY 100X72X124 (DRAPES) ×2 IMPLANT
DRAPE MICROSCOPE LEICA (MISCELLANEOUS) ×2 IMPLANT
DRAPE POUCH INSTRU U-SHP 10X18 (DRAPES) ×2 IMPLANT
DURAPREP 26ML APPLICATOR (WOUND CARE) ×2 IMPLANT
ELECT BLADE 4.0 EZ CLEAN MEGAD (MISCELLANEOUS) ×2
ELECT REM PT RETURN 9FT ADLT (ELECTROSURGICAL) ×2
ELECTRODE BLDE 4.0 EZ CLN MEGD (MISCELLANEOUS) IMPLANT
ELECTRODE REM PT RTRN 9FT ADLT (ELECTROSURGICAL) ×1 IMPLANT
GAUZE SPONGE 4X4 16PLY XRAY LF (GAUZE/BANDAGES/DRESSINGS) IMPLANT
GLOVE BIOGEL M 8.0 STRL (GLOVE) ×2 IMPLANT
GLOVE BIOGEL PI IND STRL 6.5 (GLOVE) IMPLANT
GLOVE BIOGEL PI INDICATOR 6.5 (GLOVE) ×1
GLOVE ECLIPSE 7.5 STRL STRAW (GLOVE) ×1 IMPLANT
GLOVE EXAM NITRILE LRG STRL (GLOVE) IMPLANT
GLOVE EXAM NITRILE MD LF STRL (GLOVE) IMPLANT
GLOVE EXAM NITRILE XL STR (GLOVE) IMPLANT
GLOVE EXAM NITRILE XS STR PU (GLOVE) IMPLANT
GLOVE INDICATOR 8.0 STRL GRN (GLOVE) ×1 IMPLANT
GOWN BRE IMP SLV AUR LG STRL (GOWN DISPOSABLE) ×5 IMPLANT
GOWN BRE IMP SLV AUR XL STRL (GOWN DISPOSABLE) IMPLANT
GOWN STRL REIN 2XL LVL4 (GOWN DISPOSABLE) IMPLANT
KIT BASIN OR (CUSTOM PROCEDURE TRAY) ×2 IMPLANT
KIT ROOM TURNOVER OR (KITS) ×2 IMPLANT
NDL HYPO 18GX1.5 BLUNT FILL (NEEDLE) IMPLANT
NDL HYPO 21X1.5 SAFETY (NEEDLE) IMPLANT
NDL HYPO 25X1 1.5 SAFETY (NEEDLE) IMPLANT
NDL SPNL 20GX3.5 QUINCKE YW (NEEDLE) IMPLANT
NEEDLE HYPO 18GX1.5 BLUNT FILL (NEEDLE) IMPLANT
NEEDLE HYPO 21X1.5 SAFETY (NEEDLE) IMPLANT
NEEDLE HYPO 25X1 1.5 SAFETY (NEEDLE) IMPLANT
NEEDLE SPNL 20GX3.5 QUINCKE YW (NEEDLE) IMPLANT
NS IRRIG 1000ML POUR BTL (IV SOLUTION) ×2 IMPLANT
PACK LAMINECTOMY NEURO (CUSTOM PROCEDURE TRAY) ×2 IMPLANT
PAD ABD 8X10 STRL (GAUZE/BANDAGES/DRESSINGS) IMPLANT
PAD ARMBOARD 7.5X6 YLW CONV (MISCELLANEOUS) ×6 IMPLANT
PATTIES SURGICAL .5 X1 (DISPOSABLE) ×1 IMPLANT
RUBBERBAND STERILE (MISCELLANEOUS) ×4 IMPLANT
SPONGE GAUZE 4X4 12PLY (GAUZE/BANDAGES/DRESSINGS) ×2 IMPLANT
SPONGE LAP 4X18 X RAY DECT (DISPOSABLE) IMPLANT
SPONGE SURGIFOAM ABS GEL SZ50 (HEMOSTASIS) ×2 IMPLANT
STRIP CLOSURE SKIN 1/2X4 (GAUZE/BANDAGES/DRESSINGS) ×2 IMPLANT
SUT VIC AB 0 CT1 18XCR BRD8 (SUTURE) ×1 IMPLANT
SUT VIC AB 0 CT1 8-18 (SUTURE) ×2
SUT VIC AB 2-0 CP2 18 (SUTURE) ×2 IMPLANT
SUT VIC AB 3-0 SH 8-18 (SUTURE) ×2 IMPLANT
SYR 20CC LL (SYRINGE) IMPLANT
SYR 20ML ECCENTRIC (SYRINGE) ×2 IMPLANT
SYR 5ML LL (SYRINGE) IMPLANT
TAPE CLOTH SURG 4X10 WHT LF (GAUZE/BANDAGES/DRESSINGS) ×1 IMPLANT
TOWEL OR 17X24 6PK STRL BLUE (TOWEL DISPOSABLE) ×2 IMPLANT
TOWEL OR 17X26 10 PK STRL BLUE (TOWEL DISPOSABLE) ×2 IMPLANT
WATER STERILE IRR 1000ML POUR (IV SOLUTION) ×2 IMPLANT

## 2013-01-12 NOTE — Transfer of Care (Signed)
Immediate Anesthesia Transfer of Care Note  Patient: [name]  Procedure(s) Performed: Procedure(s) with comments: Right Lumbar Three-Four Laminotomy/Foraminotomy (Right) - Right Lumbar Three-Four Laminotomy/Foraminotomy  Patient Location: PACU  Anesthesia Type:General  Level of Consciousness: awake, alert  and oriented  Airway & Oxygen Therapy: Patient Spontanous Breathing and Patient connected to face mask oxygen  Post-op Assessment: Report given to PACU RN and Post -op Vital signs reviewed and stable  Post vital signs: Reviewed and stable  Complications: No apparent anesthesia complications

## 2013-01-12 NOTE — H&P (Signed)
[name] is an 52 y.o. male.   Chief Complaint: right leg pain HPI: patient who came after an interval of 10 years with lbp with radiation to the right leg,which is getting worse despite conservative treatment with numbness and weakness  Past Medical History  Diagnosis Date  . Chest pain Sept. 2005    multiple caths  /  cath 06/15/2010..normal coronaries,  EF 60%   (false postive nuclear in the past)  . IDDM (insulin dependent diabetes mellitus)   . Hypertension   . Dyslipidemia     mixed  . Gastroparesis   . Pulmonary embolus     Hx of small left lower lobe  pulmonary embolus  . Overweight(278.02)   . Peripheral vascular disease   . Headache(784.0)   . Neuropathy associated with endocrine disorder   . Pulmonary embolus 2010ish    Past Surgical History  Procedure Laterality Date  . Back surgery  1999    x3  . Shoulder arthroscopy w/ rotator cuff repair Right   . Achilles tendon repair  2013  . Appendectomy      Family History  Problem Relation Age of Onset  . Heart attack Father 72  . Heart attack Mother 44   Social History:  reports that he has quit smoking. He does not have any smokeless tobacco history on file. He reports that he does not drink alcohol or use illicit drugs.  Allergies:  Allergies  Allergen Reactions  . Reglan [Metoclopramide] Itching and Rash    Medications Prior to Admission  Medication Sig Dispense Refill  . atorvastatin (LIPITOR) 20 MG tablet Take 20 mg by mouth daily.      . cetirizine (ZYRTEC) 10 MG tablet Take 10 mg by mouth daily.      [name] gabapentin (NEURONTIN) 600 MG tablet Take 2,400 mg by mouth at bedtime.      [name] glimepiride (AMARYL) 2 MG tablet Take 2 mg by mouth daily.        . insulin detemir (LEVEMIR) 100 UNIT/ML injection Inject 20-25 Units into the skin 2 (two) times daily.      . insulin lispro (HUMALOG) 100 UNIT/ML injection Inject 20-25 Units into the skin 2 (two) times daily.      [name] linagliptin (TRADJENTA) 5 MG TABS  tablet Take 5 mg by mouth daily.      . metFORMIN (GLUCOPHAGE) 500 MG tablet Take 1,000 mg by mouth 2 (two) times daily with a meal.      . oxyCODONE (ROXICODONE) 15 MG immediate release tablet Take 15 mg by mouth every 6 (six) hours as needed for pain.        Results for orders placed during the hospital encounter of 01/12/13 (from the past 48 hour(s))  CBC     Status: Abnormal   Collection Time    01/12/13  1:53 PM      Result Value Range   WBC 7.3  4.0 - 10.5 K/uL   RBC 4.97  4.22 - 5.81 MIL/uL   Hemoglobin 14.5  13.0 - 17.0 g/dL   HCT 42.0  39.0 - 52.0 %   MCV 84.5  78.0 - 100.0 fL   MCH 29.2  26.0 - 34.0 pg   MCHC 34.5  30.0 - 36.0 g/dL   RDW 14.5  11.5 - 15.5 %   Platelets 147 (*) 150 - 400 K/uL  GLUCOSE, CAPILLARY     Status: Abnormal   Collection Time    01/12/13  2:05 PM  Result Value Range   Glucose-Capillary 164 (*) 70 - 99 mg/dL   No results found.  Review of Systems  Constitutional: Negative.   HENT: Negative.   Cardiovascular: Negative.   Gastrointestinal: Negative.   Musculoskeletal: Positive for back pain.  Neurological: Positive for sensory change and focal weakness.  Endo/Heme/Allergies: Negative.   Psychiatric/Behavioral: Negative.     Blood pressure 147/94, pulse 66, temperature 97.6 F (36.4 C), temperature source Oral, resp. rate 20, height 6\' 5"  (1.956 m), weight 131 kg (288 lb 12.8 oz), SpO2 99.00%. Physical Exam hent,nl. Neck, nl. Cv, nl. Lungs, clear. Abdomen, soft.  NEURO df and pf or right foot  Weakness 2/5. No anlkle  Dtr.weakness of quadriceps MRI shows stenosis at l3-4 bilaterally and also at l5S1  But TO THE LEFT Assessment/Plan PATIENT TO GO AHEAD WITH right L3-4 LAMINOTOMIES AND FORAMINOTOMIES. HE IS AWARE OF RISKS AND BENEFITS  Lovetta Condie M 01/12/2013, 2:47 PM

## 2013-01-12 NOTE — Anesthesia Preprocedure Evaluation (Addendum)
Anesthesia Evaluation  Patient identified by MRN, date of birth, ID band Patient awake    Reviewed: Allergy & Precautions, H&P , NPO status , Patient's Chart, lab work & pertinent test results  Airway Mallampati: II  Neck ROM: full    Dental   Pulmonary former smoker,          Cardiovascular hypertension, + Peripheral Vascular Disease     Neuro/Psych  Headaches,    GI/Hepatic GERD-  ,  Endo/Other  diabetes, Type 2obese  Renal/GU      Musculoskeletal   Abdominal   Peds  Hematology   Anesthesia Other Findings   Reproductive/Obstetrics                          Anesthesia Physical Anesthesia Plan  ASA: III  Anesthesia Plan: General   Post-op Pain Management:    Induction: Intravenous  Airway Management Planned: Oral ETT  Additional Equipment:   Intra-op Plan:   Post-operative Plan: Extubation in OR  Informed Consent: I have reviewed the patients History and Physical, chart, labs and discussed the procedure including the risks, benefits and alternatives for the proposed anesthesia with the patient or authorized representative who has indicated his/her understanding and acceptance.     Plan Discussed with: CRNA, Anesthesiologist and Surgeon  Anesthesia Plan Comments:         Anesthesia Quick Evaluation

## 2013-01-12 NOTE — Progress Notes (Signed)
Op note [phone]

## 2013-01-12 NOTE — Preoperative (Signed)
Beta Blockers   Reason not to administer Beta Blockers:Not Applicable 

## 2013-01-12 NOTE — Anesthesia Procedure Notes (Signed)
Procedure Name: Intubation Date/Time: 01/12/2013 6:23 PM Performed by: Erik Obey Pre-anesthesia Checklist: Patient identified, Patient being monitored, Timeout performed, Emergency Drugs available and Suction available Patient Re-evaluated:Patient Re-evaluated prior to inductionOxygen Delivery Method: Circle system utilized Preoxygenation: Pre-oxygenation with 100% oxygen Intubation Type: IV induction Ventilation: Mask ventilation without difficulty and Oral airway inserted - appropriate to patient size Laryngoscope Size: Mac and 4 Grade View: Grade I Tube type: Oral Tube size: 7.5 mm Number of attempts: 1 Airway Equipment and Method: Stylet Placement Confirmation: ETT inserted through vocal cords under direct vision,  positive ETCO2 and breath sounds checked- equal and bilateral Secured at: 22 cm Tube secured with: Tape Dental Injury: Teeth and Oropharynx as per pre-operative assessment

## 2013-01-13 ENCOUNTER — Encounter (HOSPITAL_COMMUNITY): Payer: Self-pay | Admitting: Neurosurgery

## 2013-01-13 LAB — GLUCOSE, CAPILLARY: Glucose-Capillary: 222 mg/dL — ABNORMAL HIGH (ref 70–99)

## 2013-01-13 MED ORDER — INFLUENZA VAC SPLIT QUAD 0.5 ML IM SUSP
0.5000 mL | Freq: Once | INTRAMUSCULAR | Status: AC
  Administered 2013-01-13: 0.5 mL via INTRAMUSCULAR
  Filled 2013-01-13: qty 0.5

## 2013-01-13 NOTE — Progress Notes (Signed)
Occupational Therapy Evaluation Patient Details Name: [name] MRN: [id number] DOB: 1960/10/14 Today's Date: 01/13/2013 Time: 1340-1409 OT Time Calculation (min): 29 min  OT Assessment / Plan / Recommendation History of present illness L34 laminectomy and foraminotomy   Clinical Impression   Completed all education regarding ADL, mobility and back precautions ising AE and compensatory techniques. PT will have adequate assistance for D/C home. Discussed equipment recommendations with family. OT signing off. Thanks    OT Assessment  Patient does not need any further OT services    Follow Up Recommendations  No OT follow up    Barriers to Discharge      Equipment Recommendations  Tub/shower seat;Other (comment) (wife to get on own)    Recommendations for Other Services    Frequency       Precautions / Restrictions Precautions Precautions: Fall;Back Precaution Booklet Issued: Yes (comment) Precaution Comments: Handout given and reviewed with patient.  Patient demonstrated understanding and compliance with precautions. Restrictions Weight Bearing Restrictions: No   Pertinent Vitals/Pain no apparent distress     ADL  Transfers/Ambulation Related to ADLs: S ADL Comments: Educated on back precautions using compensatory techniques and AE.     OT Diagnosis:    OT Problem List:   OT Treatment Interventions:     OT Goals(Current goals can be found in the care plan section) Acute Rehab OT Goals Patient Stated Goal: get better  Visit Information  Last OT Received On: 01/13/13 Assistance Needed: +1 History of Present Illness: L34 laminectomy and foraminotomy       Prior Functioning     Home Living Family/patient expects to be discharged to:: Private residence Living Arrangements: Spouse/significant other Available Help at Discharge: Family;Friend(s) Type of Home: House Home Access: Stairs to enter Technical brewer of Steps: 2 Entrance Stairs-Rails:  Right Home Layout: One level Home Equipment: [place] - single point Prior Function Level of Independence: Independent Communication Communication: No difficulties         Vision/Perception Vision - History Baseline Vision: No visual deficits   Cognition  Cognition Arousal/Alertness: Awake/alert Behavior During Therapy: WFL for tasks assessed/performed Overall Cognitive Status: Within Functional Limits for tasks assessed    Extremity/Trunk Assessment Upper Extremity Assessment Upper Extremity Assessment: Overall WFL for tasks assessed Lower Extremity Assessment Lower Extremity Assessment: Generalized weakness Cervical / Trunk Assessment Cervical / Trunk Assessment: Other exceptions (s/p lam/form)     Mobility Bed Mobility Bed Mobility: Supine to Sit Supine to Sit: 5: Supervision Details for Bed Mobility Assistance: For safety.  Demonstrated proper log roll form with minimal VC.  HOB elevated during eval, if seen tomorrow will need to be evaluated with HOB lowered to flat. Transfers Sit to Stand: 5: Supervision Stand to Sit: 5: Supervision Details for Transfer Assistance: For safety at to ensure upright posture throughout motion.     Exercise     Balance  independent   End of Session OT - End of Session Activity Tolerance: Patient tolerated treatment well Patient left: in bed;with call bell/phone within reach;with family/visitor present Nurse Communication: Mobility status  GO     Twinkle Sockwell,HILLARY 01/13/2013, 2:49 PM [facility], OTR/L  [phone] 01/13/2013

## 2013-01-13 NOTE — Anesthesia Postprocedure Evaluation (Signed)
  Anesthesia Post-op Note  Patient: [name]  Procedure(s) Performed: Procedure(s) with comments: Right Lumbar Three-Four Laminotomy/Foraminotomy (Right) - Right Lumbar Three-Four Laminotomy/Foraminotomy  Patient Location: PACU  Anesthesia Type:General  Level of Consciousness: awake, alert  and oriented  Airway and Oxygen Therapy: Patient Spontanous Breathing  Post-op Pain: mild  Post-op Assessment: Post-op Vital signs reviewed, Patient's Cardiovascular Status Stable, Respiratory Function Stable, Patent Airway and Pain level controlled  Post-op Vital Signs: stable  Complications: No apparent anesthesia complications

## 2013-01-13 NOTE — Op Note (Signed)
NAMEHERVEY, [name]              ACCOUNT NO.:  [id number]  MEDICAL RECORD NO.:  [id number]  LOCATION:  [id number]                        FACILITY:  [place]  PHYSICIAN:  Leeroy Cha, M.D.   DATE OF BIRTH:  1960/11/25  DATE OF PROCEDURE:  01/12/2013 DATE OF DISCHARGE:                              OPERATIVE REPORT   PREOPERATIVE DIAGNOSES:  Right L3-L4 stenosis with chronic radiculopathy.  Status post fusion L4-L5 with cages and pedicle screws.  POSTOPERATIVE DIAGNOSES:  Right L3-L4 stenosis with chronic radiculopathy.  Status post fusion L4-L5 with cages and pedicle screws.  PROCEDURE:  Right L3-L4 laminotomy, foraminotomy, decompression of the thecal sac, lysis of adhesion, decompression of the right L3 and L4 nerve root.  Microscope.  SURGEON:  Leeroy Cha, M.D.  ASSISTING:  Hosie Spangle, M.D.  CLINICAL HISTORY:  Mr. Kocis is a gentleman who in the past underwent surgery, the last one L4-L5 fusion.  Lately he had been complaining of chronic with acute onset of pain going to the right leg with weakness of the quadriceps.  He has lost control of his leg in two occasions.  He has some mild weakness on dorsiflexing the right foot.  We did a myelogram which showed  foraminal narrow at the level L5-S1, the left worse than right one with a space in the right side.  The fusion was solid at the level L4-L5, but at the level L3-L4, he has a bilateral stenosis with foraminal compromise, the right worse than the left one. The patient has failed conservative treatment.  Surgery was advised. The patient knew the risk with the surgery such as hematoma, infection, CSF leak, no improvement whatsoever, need of further surgery.  PROCEDURE:  The patient was taken to the OR, and after intubation, he was positioned in a prone manner.  The gentleman is quite heavy and it was difficult to feel any bony structure.  We did an incision following the previous one in the upper part through the  skin, subcutaneous tissue, through a thick adipose tissue straight down to the lumbar spine.  X-rays, the first one showed that we were at the level of L3. From then on, using the microscope, we started drilling the lamina of L3 and we worked our way down to L4.  Patient had the decompression of the L3 nerve root was done easily without any problem.  Decompression of the L4 nerve root was more difficult, because a lot of adhesions  between the dura mater on the floor of the canal.  Lysis was accomplished.  We found the disk space.  Another x-ray was taken and then we went straight down with a foramen to decompress the L4 nerve root.  I investigated above and below and there was no evidence of any herniated disk.  The disk itself was flat.  The ligament was removed, was also calcified.  Then at the end, we have a good decompression of the lateral aspect of the L3-L4 space with a space for the L3 and L4 nerve root.  Then, Valsalva maneuver was done which was negative.  Drain was left in the epidural space and the wound was closed with different layer of Vicryl and staples.  ______________________________ Leeroy Cha, M.D.     EB/MEDQ  D:  01/12/2013  T:  01/13/2013  Job:  [id number]

## 2013-01-13 NOTE — Progress Notes (Signed)
Pt given D/C instructions with Rx's, verbal understanding given. Pt D/C'd home via wheelchair @ 1625 per MD order. Pt stable @ D/C and had no other needs. Holli Humbles, RN

## 2013-01-13 NOTE — Evaluation (Signed)
Read, reviewed, and agree.  Barbarann Ehlers [place], [place], DPT [phone]

## 2013-01-13 NOTE — Progress Notes (Signed)
Patient ID: [name], male   DOB: 24-Nov-1960, 52 y.o.   MRN: [id number] Ambulating with pt. No pain, no weakness. Dc this pm?

## 2013-01-13 NOTE — Evaluation (Signed)
Physical Therapy Evaluation Patient Details Name: [name] MRN: [id number] DOB: 13-Sep-1960 Today's Date: 01/13/2013 Time: [id number] PT Time Calculation (min): 21 min  PT Assessment / Plan / Recommendation History of Present Illness  L34 laminectomy and foraminotomy  Clinical Impression  Patient is s/p L34 laminectomy and foraminotomy surgery resulting in the deficits listed below (see PT Problem List). Patient presents with poor balance and general deconditioning.  Patient will benefit from skilled PT to increase their independence and safety with mobility (while adhering to their precautions) to allow discharge to home with supervision.       PT Assessment  Patient needs continued PT services    Follow Up Recommendations  Home health PT; Supervision-Intermittent          Equipment Recommendations    None at this time, will continue to assess      Frequency Min 5X/week    Precautions / Restrictions Precautions Precautions: Fall;Back Precaution Booklet Issued: Yes (comment) Precaution Comments: Handout given and reviewed with patient.  Patient demonstrated understanding and compliance with precautions. Restrictions Weight Bearing Restrictions: No   Pertinent Vitals/Pain Minimal pain at start of session in low back.  Walked to decrease pain and stiffness.      Mobility  Bed Mobility Bed Mobility: Supine to Sit Supine to Sit: 4: Min guard Details for Bed Mobility Assistance: For safety.  Demonstrated proper log roll form with minimal VC.  HOB elevated during eval, if seen tomorrow will need to be evaluated with HOB lowered to flat. Transfers Transfers: Sit to Stand;Stand to Sit Sit to Stand: 4: Min guard Stand to Sit: 4: Min guard Details for Transfer Assistance: For safety at to ensure upright posture throughout motion. Ambulation/Gait Ambulation/Gait Assistance: 4: Min assist Ambulation Distance (Feet): 300 Feet Assistive device: None Ambulation/Gait  Assistance Details: Patient has very decreased gait speed throughout ambulation.  Patient shows signs of deconditioning through shortness of breath, but able to maintain conversation throughout.  Patient has history or right leg 'giving out' during ambulation causing frequent falls (2x/week), so he is adjusting to being able to trust his leg during ambulation.  He takes very short steps, which leads to unsteadiness due to small base of support. Gait Pattern: Step-to pattern;Decreased stride length Gait velocity: decreased General Gait Details: Occasional stumbles throughout walk, though no LOB.  Next time he walks, we will assess with AD (he uses a cane in the house in the past) Stairs: Yes Stairs Assistance: 4: Min guard Stair Management Technique: Step to pattern;Forwards;One rail Right (Leads)    Exercises     PT Diagnosis: Difficulty walking;Generalized weakness  PT Problem List: Decreased strength;Decreased activity tolerance;Decreased balance;Decreased coordination PT Treatment Interventions: DME instruction;Stair training;Gait training;Functional mobility training;Patient/family education     PT Goals(Current goals can be found in the care plan section) Acute Rehab PT Goals Patient Stated Goal: get better PT Goal Formulation: With patient Time For Goal Achievement: 01/27/13 Potential to Achieve Goals: Good  Visit Information  Last PT Received On: 01/13/13 Assistance Needed: +1 History of Present Illness: L34 laminectomy and foraminotomy       Prior Functioning  Home Living Family/patient expects to be discharged to:: Private residence Living Arrangements: Spouse/significant other Available Help at Discharge: Family;Friend(s) Type of Home: House Home Access: Stairs to enter Technical brewer of Steps: 2 Entrance Stairs-Rails: Right Home Layout: One level [place] - single point Prior Function Level of Independence:  Independent Communication Communication: No difficulties    Cognition  Cognition Arousal/Alertness: Awake/alert  Behavior During Therapy: WFL for tasks assessed/performed Overall Cognitive Status: Within Functional Limits for tasks assessed    Extremity/Trunk Assessment Upper Extremity Assessment Upper Extremity Assessment: Defer to OT evaluation Lower Extremity Assessment Lower Extremity Assessment: Generalized weakness   Balance    End of Session PT - End of Session Equipment Utilized During Treatment: Gait belt Activity Tolerance: Patient tolerated treatment well;Patient limited by fatigue Patient left: in bed;with call bell/phone within reach (in the upright chair position) Nurse Communication: Mobility status  GP    Cordelia Poche, SPT Pager:  [place] 01/13/2013, 10:59 AM

## 2013-01-13 NOTE — Discharge Summary (Signed)
Physician Discharge Summary  Patient ID: [name] MRN: [id number] DOB/AGE: [birth year] 52 y.o.  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 rate 16, height 6\' 5"  (1.956 m), weight 131 kg (288 lb 12.8 oz), SpO2 92.00%. Walking. No pain  Disposition: home     Medication List    ASK your doctor about these medications       atorvastatin 20 MG tablet  Commonly known as:  LIPITOR  Take 20 mg by mouth daily.     cetirizine 10 MG tablet  Commonly known as:  ZYRTEC  Take 10 mg by mouth daily.     gabapentin 600 MG tablet  Commonly known as:  NEURONTIN  Take 2,400 mg by mouth at bedtime.     glimepiride 2 MG tablet  Commonly known as:  AMARYL  Take 2 mg by mouth daily.     insulin detemir 100 UNIT/ML injection  Commonly known as:  LEVEMIR  Inject 20-25 Units into the skin 2 (two) times daily.     insulin lispro 100 UNIT/ML injection  Commonly known as:  HUMALOG  Inject 20-25 Units into the skin 2 (two) times daily.     metFORMIN 500 MG tablet  Commonly known as:  GLUCOPHAGE  Take 1,000 mg by mouth 2 (two) times daily with a meal.     oxyCODONE 15 MG immediate release tablet  Commonly known as:  ROXICODONE  Take 15 mg by mouth every 6 (six) hours as needed for pain.     TRADJENTA 5 MG Tabs tablet  Generic drug:  linagliptin  Take 5 mg by mouth daily.         Signed: Floyce Stakes 01/13/2013, 3:02 PM

## 2013-05-27 ENCOUNTER — Other Ambulatory Visit: Payer: Self-pay | Admitting: Podiatry

## 2013-05-30 ENCOUNTER — Encounter (HOSPITAL_COMMUNITY): Payer: Self-pay | Admitting: Pharmacy Technician

## 2013-05-30 ENCOUNTER — Other Ambulatory Visit (HOSPITAL_COMMUNITY): Payer: Medicare Other

## 2013-05-31 ENCOUNTER — Encounter (HOSPITAL_COMMUNITY): Payer: Self-pay

## 2013-05-31 ENCOUNTER — Encounter (HOSPITAL_COMMUNITY)
Admission: RE | Admit: 2013-05-31 | Discharge: 2013-05-31 | Disposition: A | Discharge status: Home / Self Care | Payer: Medicare HMO | Source: Ambulatory Visit | Attending: Podiatry | Admitting: Podiatry

## 2013-05-31 ENCOUNTER — Ambulatory Visit (HOSPITAL_COMMUNITY)
Admission: RE | Admit: 2013-05-31 | Discharge: 2013-05-31 | Disposition: A | Discharge status: Home / Self Care | Payer: Medicare HMO | Source: Ambulatory Visit | Attending: Podiatry | Admitting: Podiatry

## 2013-05-31 DIAGNOSIS — M869 Osteomyelitis, unspecified: Secondary | ICD-10-CM | POA: Insufficient documentation

## 2013-05-31 DIAGNOSIS — Z01818 Encounter for other preprocedural examination: Secondary | ICD-10-CM | POA: Insufficient documentation

## 2013-05-31 DIAGNOSIS — Z01812 Encounter for preprocedural laboratory examination: Secondary | ICD-10-CM | POA: Insufficient documentation

## 2013-05-31 LAB — BASIC METABOLIC PANEL
BUN: 21 mg/dL (ref 6–23)
CHLORIDE: 100 meq/L (ref 96–112)
CO2: 25 meq/L (ref 19–32)
CREATININE: 1.18 mg/dL (ref 0.50–1.35)
Calcium: 9.7 mg/dL (ref 8.4–10.5)
GFR calc Af Amer: 80 mL/min — ABNORMAL LOW (ref >90)
GFR calc non Af Amer: 69 mL/min — ABNORMAL LOW (ref >90)
GLUCOSE: 275 mg/dL — AB (ref 70–99)
Potassium: 4.4 mEq/L (ref 3.7–5.3)
Sodium: 138 mEq/L (ref 137–147)

## 2013-05-31 LAB — HEMOGLOBIN AND HEMATOCRIT, BLOOD
HCT: 39.7 % (ref 39.0–52.0)
HEMOGLOBIN: 13.4 g/dL (ref 13.0–17.0)

## 2013-05-31 NOTE — Patient Instructions (Addendum)
   Your procedure is scheduled on: 06/02/2013  Report to [facility] at 64    AM.  Call this number if you have problems the morning of surgery: [phone]   Remember:   Do not drink or eat food:After Midnight.  :     Do not wear jewelry, make-up or nail polish.  Do not wear lotions, powders, or perfumes. You may wear deodorant.  Do not shave 48 hours prior to surgery. Men may shave face and neck.  Do not bring valuables to the hospital.  Contacts, dentures or bridgework may not be worn into surgery.  Leave suitcase in the car. After surgery it may be brought to your room.  For patients admitted to the hospital, checkout time is 11:00 AM the day of discharge.   Patients discharged the day of surgery will not be allowed to drive home.    Special Instructions: Shower using CHG night before surgery and shower the day of surgery use CHG.  Use special wash - you have one bottle of CHG for all showers.  You should use approximately 1/2 of the bottle for each shower.   Please read over the following fact sheets that you were given: Pain Booklet, MRSA Information, Surgical Site Infection Prevention and Care and Recovery After Surgery  Toe Injuries and Amputations You have cut off (amputated) part of your toe. Your outcome depends largely on how much was amputated. If just the tip is amputated, often the end of the toe will grow back and the toe may return much to the same as it was before the injury. If more of the toe is missing, your caregiver has done the best with the tissue remaining to allow you to keep as much toe as is possible or has finished the amputation at a level that will leave you with the most functional toe. This means a toe that will work the best for you. Please read the instructions outlined below and refer to this sheet in the next few weeks. These instructions provide you with general information on caring for yourself. Your caregiver may also give you specific instructions. While  your treatment has been done according to the most current medical practices available, unavoidable complications occasionally occur. If you have any problems or questions after discharge, call your caregiver. HOME CARE INSTRUCTIONS   You may resume a normal diet and activities as directed or allowed.  Keep your foot elevated when possible. This helps decrease pain and swelling.  Keep ice packs (a bag of ice wrapped in a towel) on the injured area for 15-20 minutes, 03-04 times per day, for the first two days. Use ice only if OK with your caregiver.  Change dressings if necessary or as directed.  Clean the wounded area as directed.  Only take over-the-counter or prescription medicines for pain, discomfort, or fever as directed by your caregiver.  Keep appointments as directed. SEEK IMMEDIATE MEDICAL CARE IF:  There is redness, swelling, numbness or increasing pain in the wound.  There is pus coming from wound.  You have an unexplained oral temperature above 102 F (38.9 C) or as your caregiver suggests.  There is a bad (foul) smell coming from the wound or dressing.  The edges of the wound break open (the edges are not staying together) after sutures or staples have been removed. Document Released: 12/11/2004 Document Revised: 04/14/2011 Document Reviewed: 05/10/2008 [facility] Patient Information 2014 [place], [place].

## 2013-05-31 NOTE — Progress Notes (Signed)
05/31/13 1451  OBSTRUCTIVE SLEEP APNEA  Have you ever been diagnosed with sleep apnea through a sleep study? No  Do you snore loudly (loud enough to be heard through closed doors)?  0  Do you often feel tired, fatigued, or sleepy during the daytime? 0  Has anyone observed you stop breathing during your sleep? 0  Do you have, or are you being treated for high blood pressure? 1  BMI more than 35 kg/m2? 1  Age over 54 years old? 1  Neck circumference greater than 40 cm/16 inches? 0  Gender: 1  Obstructive Sleep Apnea Score 4  Score 4 or greater  Results sent to PCP

## 2013-06-02 ENCOUNTER — Ambulatory Visit (HOSPITAL_COMMUNITY): Payer: Medicare HMO

## 2013-06-02 ENCOUNTER — Encounter (HOSPITAL_COMMUNITY)
Admission: RE | Disposition: A | Discharge status: Home / Self Care | Payer: Self-pay | Source: Ambulatory Visit | Attending: Podiatry

## 2013-06-02 ENCOUNTER — Ambulatory Visit (HOSPITAL_COMMUNITY): Payer: Medicare HMO | Admitting: Anesthesiology

## 2013-06-02 ENCOUNTER — Ambulatory Visit (HOSPITAL_COMMUNITY)
Admission: RE | Admit: 2013-06-02 | Discharge: 2013-06-02 | Disposition: A | Discharge status: Home / Self Care | Payer: Medicare HMO | Source: Ambulatory Visit | Attending: Podiatry | Admitting: Podiatry

## 2013-06-02 ENCOUNTER — Encounter (HOSPITAL_COMMUNITY): Payer: Medicare HMO | Admitting: Anesthesiology

## 2013-06-02 ENCOUNTER — Encounter (HOSPITAL_COMMUNITY): Payer: Self-pay | Admitting: *Deleted

## 2013-06-02 DIAGNOSIS — M869 Osteomyelitis, unspecified: Secondary | ICD-10-CM

## 2013-06-02 DIAGNOSIS — I739 Peripheral vascular disease, unspecified: Secondary | ICD-10-CM | POA: Insufficient documentation

## 2013-06-02 DIAGNOSIS — Z794 Long term (current) use of insulin: Secondary | ICD-10-CM | POA: Insufficient documentation

## 2013-06-02 DIAGNOSIS — Z79899 Other long term (current) drug therapy: Secondary | ICD-10-CM | POA: Insufficient documentation

## 2013-06-02 DIAGNOSIS — E1149 Type 2 diabetes mellitus with other diabetic neurological complication: Secondary | ICD-10-CM | POA: Insufficient documentation

## 2013-06-02 DIAGNOSIS — K219 Gastro-esophageal reflux disease without esophagitis: Secondary | ICD-10-CM | POA: Insufficient documentation

## 2013-06-02 DIAGNOSIS — E1142 Type 2 diabetes mellitus with diabetic polyneuropathy: Secondary | ICD-10-CM | POA: Insufficient documentation

## 2013-06-02 HISTORY — PX: AMPUTATION: SHX166

## 2013-06-02 LAB — GLUCOSE, CAPILLARY
GLUCOSE-CAPILLARY: 141 mg/dL — AB (ref 70–99)
Glucose-Capillary: 173 mg/dL — ABNORMAL HIGH (ref 70–99)

## 2013-06-02 SURGERY — AMPUTATION DIGIT
Anesthesia: Monitor Anesthesia Care | Site: Foot | Laterality: Left

## 2013-06-02 MED ORDER — ONDANSETRON HCL 4 MG/2ML IJ SOLN: 4.0000 mg | Freq: Once | INTRAMUSCULAR | Status: DC | PRN

## 2013-06-02 MED ORDER — LIDOCAINE HCL (CARDIAC) 10 MG/ML IV SOLN
INTRAVENOUS | Status: DC | PRN
  Administered 2013-06-02: 10 mg via INTRAVENOUS

## 2013-06-02 MED ORDER — VANCOMYCIN HCL 10 G IV SOLR
2000.0000 mg | Freq: Once | INTRAVENOUS | Status: AC
  Administered 2013-06-02: 2000 mg via INTRAVENOUS
  Filled 2013-06-02: qty 2000

## 2013-06-02 MED ORDER — PROPOFOL 10 MG/ML IV EMUL
INTRAVENOUS | Status: AC
  Filled 2013-06-02: qty 20

## 2013-06-02 MED ORDER — LIDOCAINE HCL (PF) 1 % IJ SOLN
INTRAMUSCULAR | Status: AC
  Filled 2013-06-02: qty 5

## 2013-06-02 MED ORDER — MIDAZOLAM HCL 2 MG/2ML IJ SOLN
1.0000 mg | INTRAMUSCULAR | Status: DC | PRN
  Administered 2013-06-02: 2 mg via INTRAVENOUS

## 2013-06-02 MED ORDER — PROPOFOL INFUSION 10 MG/ML OPTIME
INTRAVENOUS | Status: DC | PRN
  Administered 2013-06-02: 40 ug/kg/min via INTRAVENOUS
  Administered 2013-06-02: 200 ug/kg/min via INTRAVENOUS

## 2013-06-02 MED ORDER — FENTANYL CITRATE 0.05 MG/ML IJ SOLN
INTRAMUSCULAR | Status: AC
  Filled 2013-06-02: qty 2

## 2013-06-02 MED ORDER — LACTATED RINGERS IV SOLN
INTRAVENOUS | Status: DC
  Administered 2013-06-02: 09:00:00 via INTRAVENOUS

## 2013-06-02 MED ORDER — 0.9 % SODIUM CHLORIDE (POUR BTL) OPTIME
TOPICAL | Status: DC | PRN
  Administered 2013-06-02: 1000 mL

## 2013-06-02 MED ORDER — BUPIVACAINE HCL (PF) 0.5 % IJ SOLN
INTRAMUSCULAR | Status: AC
  Filled 2013-06-02: qty 30

## 2013-06-02 MED ORDER — FENTANYL CITRATE 0.05 MG/ML IJ SOLN
INTRAMUSCULAR | Status: DC | PRN
  Administered 2013-06-02 (×4): 25 ug via INTRAVENOUS

## 2013-06-02 MED ORDER — MIDAZOLAM HCL 2 MG/2ML IJ SOLN
INTRAMUSCULAR | Status: AC
  Filled 2013-06-02: qty 2

## 2013-06-02 MED ORDER — BUPIVACAINE HCL (PF) 0.5 % IJ SOLN
INTRAMUSCULAR | Status: DC | PRN
  Administered 2013-06-02: 20 mL

## 2013-06-02 MED ORDER — FENTANYL CITRATE 0.05 MG/ML IJ SOLN: 25.0000 ug | INTRAMUSCULAR | Status: DC | PRN

## 2013-06-02 MED ORDER — FENTANYL CITRATE 0.05 MG/ML IJ SOLN
25.0000 ug | INTRAMUSCULAR | Status: AC
  Administered 2013-06-02 (×2): 25 ug via INTRAVENOUS

## 2013-06-02 SURGICAL SUPPLY — 44 items
APL SKNCLS STERI-STRIP NONHPOA (GAUZE/BANDAGES/DRESSINGS) ×1
BAG HAMPER (MISCELLANEOUS) ×2 IMPLANT
BANDAGE CONFORM 2X5YD N/S (GAUZE/BANDAGES/DRESSINGS) ×1 IMPLANT
BANDAGE ELASTIC 4 VELCRO NS (GAUZE/BANDAGES/DRESSINGS) ×2 IMPLANT
BANDAGE ESMARK 4X12 BL STRL LF (DISPOSABLE) ×1 IMPLANT
BANDAGE GAUZE ELAST BULKY 4 IN (GAUZE/BANDAGES/DRESSINGS) ×1 IMPLANT
BENZOIN TINCTURE PRP APPL 2/3 (GAUZE/BANDAGES/DRESSINGS) ×1 IMPLANT
BLADE 15 SAFETY STRL DISP (BLADE) ×6 IMPLANT
BLADE AVERAGE 25X9 (BLADE) ×1 IMPLANT
BNDG CMPR 12X4 ELC STRL LF (DISPOSABLE) ×1
BNDG CONFORM 2 STRL LF (GAUZE/BANDAGES/DRESSINGS) ×2 IMPLANT
BNDG ESMARK 4X12 BLUE STRL LF (DISPOSABLE) ×2
BOOT STEPPER DURA LG (SOFTGOODS) ×1 IMPLANT
CHLORAPREP W/TINT 26ML (MISCELLANEOUS) ×1 IMPLANT
CLOTH BEACON ORANGE TIMEOUT ST (SAFETY) ×2 IMPLANT
COVER LIGHT HANDLE STERIS (MISCELLANEOUS) ×4 IMPLANT
CUFF TOURNIQUET SINGLE 18IN (TOURNIQUET CUFF) ×2 IMPLANT
DECANTER SPIKE VIAL GLASS SM (MISCELLANEOUS) ×2 IMPLANT
DRSG ADAPTIC 3X8 NADH LF (GAUZE/BANDAGES/DRESSINGS) ×2 IMPLANT
ELECT REM PT RETURN 9FT ADLT (ELECTROSURGICAL) ×2
ELECTRODE REM PT RTRN 9FT ADLT (ELECTROSURGICAL) ×1 IMPLANT
GLOVE BIO SURGEON STRL SZ7.5 (GLOVE) ×2 IMPLANT
GLOVE ECLIPSE 6.5 STRL STRAW (GLOVE) ×1 IMPLANT
GLOVE ECLIPSE 8.0 STRL XLNG CF (GLOVE) ×1 IMPLANT
GLOVE INDICATOR 7.0 STRL GRN (GLOVE) ×2 IMPLANT
GLOVE INDICATOR 8.5 STRL (GLOVE) ×1 IMPLANT
GOWN STRL REUS W/TWL LRG LVL3 (GOWN DISPOSABLE) ×4 IMPLANT
KIT ROOM TURNOVER APOR (KITS) ×2 IMPLANT
MANIFOLD NEPTUNE II (INSTRUMENTS) ×2 IMPLANT
NDL HYPO 27GX1-1/4 (NEEDLE) ×2 IMPLANT
NEEDLE HYPO 27GX1-1/4 (NEEDLE) ×4 IMPLANT
NS IRRIG 1000ML POUR BTL (IV SOLUTION) ×2 IMPLANT
PACK BASIC LIMB (CUSTOM PROCEDURE TRAY) ×2 IMPLANT
PAD ARMBOARD 7.5X6 YLW CONV (MISCELLANEOUS) ×2 IMPLANT
RASP SM TEAR CROSS CUT (RASP) IMPLANT
SET BASIN LINEN APH (SET/KITS/TRAYS/PACK) ×2 IMPLANT
SOL PREP PROV IODINE SCRUB 4OZ (MISCELLANEOUS) ×1 IMPLANT
SPONGE GAUZE 4X4 12PLY (GAUZE/BANDAGES/DRESSINGS) ×2 IMPLANT
SPONGE LAP 18X18 X RAY DECT (DISPOSABLE) ×2 IMPLANT
STRIP CLOSURE SKIN 1/2X4 (GAUZE/BANDAGES/DRESSINGS) ×2 IMPLANT
SUT ETHILON 4 0 PS 2 18 (SUTURE) IMPLANT
SUT PROLENE 4 0 PS 2 18 (SUTURE) ×3 IMPLANT
SUT VIC AB 4-0 PS2 27 (SUTURE) ×1 IMPLANT
SYR CONTROL 10ML LL (SYRINGE) ×4 IMPLANT

## 2013-06-02 NOTE — Anesthesia Postprocedure Evaluation (Signed)
Anesthesia Post Note  Patient: [name]  Procedure(s) Performed: Procedure(s) (LRB): AMPUTATION 1ST TOE LEFT FOOT (Left)  Anesthesia type: MAC  Patient location: PACU  Post pain: Pain level controlled  Post assessment: Post-op Vital signs reviewed, Patient's Cardiovascular Status Stable, Respiratory Function Stable, Patent Airway, No signs of Nausea or vomiting and Pain level controlled  Last Vitals:  Filed Vitals:   06/02/13 1003  BP: 128/85  Pulse: 67  Temp: 36.7 C  Resp: 21    Post vital signs: Reviewed and stable  Level of consciousness: awake and alert   Complications: No apparent anesthesia complications

## 2013-06-02 NOTE — Op Note (Signed)
OPERATIVE NOTE  DATE OF PROCEDURE:  06/02/2013  SURGEON:   Marcheta Grammes, DPM  OR STAFF:   Circulator: Beckie Salts Page, RN Scrub Person: Alease Medina, CST; Shawnie Dapper, RN RN First Assistant: Towanda Malkin, RN   PREOPERATIVE DIAGNOSIS:   1.  Osteomyelitis, left foot 2.  Diabetes mellitus with peripheral neuropathy  POSTOPERATIVE DIAGNOSIS: Same  PROCEDURE: Amputation of the left great toe at the metatarsophalangeal joint, left foot  ANESTHESIA:  Monitor Anesthesia Care   HEMOSTASIS:   Pneumatic ankle tourniquet set at 250 mmHg  ESTIMATED BLOOD LOSS:   Minimal (<5 cc)  MATERIALS USED:  None  INJECTABLES: Marcaine 0.5% plain; 23mL  PATHOLOGY:   None  COMPLICATIONS:   None  INDICATIONS:  The patient sustained a puncture wound of his left great toe.  An MRI has been performed which revealed findings consistent with osteomyelitis.     DESCRIPTION OF THE PROCEDURE:   The patient was brought to the operating room and placed on the operative table in the supine position.  A pneumatic ankle tourniquet was applied to the patient's ankle.  Following sedation, the surgical site was anesthetized with 0.5% Marcaine plain.  The foot was then prepped, scrubbed, and draped in the usual sterile technique.  The foot was elevated, exsanguinated and the pneumatic ankle tourniquet inflated to 250 mmHg.    2 converging semi-elliptical incisions were performed.  Dissection was continued deep down to level of the metatarsophalangeal joint.  The joint was disarticulated and the toe passed from the operative field.  It was sent to pathology for evaluation.  Aerobic and anaerobic cultures were performed.  The wound was irrigated with copious amounts of sterile irrigant.  The first metatarsal head was evaluated.  No erosive changes were identified.  The articular surface appeared normal with no defect.  The skin was reapproximate using 3-0 Prolene in a horizontal mattress and  simple suture technique.  A sterile compressive dressing was applied to the operative foot.  The pneumatic ankle tourniquet was deflated and a prompt hyperemic response was noted to all remaining digits of the left foot.   The patient tolerated the procedure well.  The patient was then transferred to PACU with vital signs stable and vascular status intact to all toes of the operative foot.  Following a period of postoperative monitoring, the patient will be discharged home.

## 2013-06-02 NOTE — Transfer of Care (Signed)
Immediate Anesthesia Transfer of Care Note  Patient: [name]  Procedure(s) Performed: Procedure(s) (LRB): AMPUTATION 1ST TOE LEFT FOOT (Left)  Patient Location: PACU  Anesthesia Type: MAC  Level of Consciousness: awake  Airway & Oxygen Therapy: Patient Spontanous Breathing. Nasal cannula  Post-op Assessment: Report given to PACU RN, Post -op Vital signs reviewed and stable and Patient moving all extremities  Post vital signs: Reviewed and stable  Complications: No apparent anesthesia complications

## 2013-06-02 NOTE — Discharge Instructions (Signed)
Incision Care  An incision is when a surgeon cuts into your body tissues. After surgery, the incision needs to be cared for properly to prevent infection.   HOME CARE INSTRUCTIONS    Take all medicine as directed by your caregiver. Only take over-the-counter or prescription medicines for pain, discomfort, or fever as directed by your caregiver.   Do not remove your bandage (dressing) or get your incision wet until your surgeon gives you permission. In the event that your dressing becomes wet, dirty, or starts to smell, change the dressing and call your surgeon for instructions as soon as possible.   Take showers. Do not take tub baths, swim, or do anything that may soak the wound until it is healed.   Resume your normal diet and activities as directed or allowed.   Avoid lifting any weight until you are instructed otherwise.   Use anti-itch antihistamine medicine as directed by your caregiver. The wound may itch when it is healing. Do not pick or scratch at the wound.   Follow up with your caregiver for stitch (suture) or staple removal as directed.   Drink enough fluids to keep your urine clear or pale yellow.  SEEK MEDICAL CARE IF:    You have redness, swelling, or increasing pain in the wound that is not controlled with medicine.   You have drainage, blood, or pus coming from the wound that lasts longer than 1 day.   You develop muscle aches, chills, or a general ill feeling.   You notice a bad smell coming from the wound or dressing.   Your wound edges separate after the sutures, staples, or skin adhesive strips have been removed.   You develop persistent nausea or vomiting.  SEEK IMMEDIATE MEDICAL CARE IF:    You have a fever.   You develop a rash.   You develop dizzy episodes or faint while standing.   You have difficulty breathing.   You develop any reaction or side effects to medicine given.  MAKE SURE YOU:    Understand these instructions.   Will watch your condition.   Will get help  right away if you are not doing well or get worse.  Document Released: 08/09/2004 Document Revised: 04/14/2011 Document Reviewed: 05/26/2010  [employer] Patient Information 2014 [employer].

## 2013-06-02 NOTE — Anesthesia Procedure Notes (Signed)
Procedure Name: MAC Date/Time: 06/02/2013 8:56 AM Performed by: Vista Deck Pre-anesthesia Checklist: Patient identified, Emergency Drugs available, Suction available, Timeout performed and Patient being monitored Patient Re-evaluated:Patient Re-evaluated prior to inductionOxygen Delivery Method: Non-rebreather mask

## 2013-06-02 NOTE — H&P (Signed)
HISTORY AND PHYSICAL INTERVAL NOTE:  06/02/2013  8:51 AM  [place]  has presented today for surgery, with the diagnosis of osteomyelitis left foot, diabetes mellitus .  The various methods of treatment have been discussed with the patient.  No guarantees were given.  After consideration of risks, benefits and other options for treatment, the patient has consented to surgery.  I have reviewed the patients' chart and labs.    Patient Vitals for the past 24 hrs:  Temp Temp src Pulse  06/02/13 0755 97.5 F (36.4 C) Oral 67    A history and physical examination was performed in my office.  The patient was reexamined.  There have been no changes to this history and physical examination.  Marcheta Grammes, DPM

## 2013-06-02 NOTE — Anesthesia Preprocedure Evaluation (Addendum)
Anesthesia Evaluation  Patient identified by MRN, date of birth, ID band Patient awake    Reviewed: Allergy & Precautions, H&P , NPO status , Patient's Chart, lab work & pertinent test results  Airway Mallampati: II TM Distance: >3 FB Neck ROM: Full    Dental  (+) Teeth Intact   Pulmonary former smoker,  breath sounds clear to auscultation        Cardiovascular hypertension, Pt. on medications + Peripheral Vascular Disease Rhythm:Regular Rate:Normal     Neuro/Psych  Headaches,    GI/Hepatic GERD-  Controlled and Medicated,  Endo/Other  diabetes, Type 2, Insulin Dependent, Oral Hypoglycemic AgentsMorbid obesity  Renal/GU      Musculoskeletal   Abdominal   Peds  Hematology   Anesthesia Other Findings   Reproductive/Obstetrics                          Anesthesia Physical Anesthesia Plan  ASA: III  Anesthesia Plan: MAC   Post-op Pain Management:    Induction: Intravenous  Airway Management Planned: Nasal Cannula  Additional Equipment:   Intra-op Plan:   Post-operative Plan:   Informed Consent: I have reviewed the patients History and Physical, chart, labs and discussed the procedure including the risks, benefits and alternatives for the proposed anesthesia with the patient or authorized representative who has indicated his/her understanding and acceptance.     Plan Discussed with:   Anesthesia Plan Comments:        Anesthesia Quick Evaluation

## 2013-06-03 ENCOUNTER — Encounter (HOSPITAL_COMMUNITY): Payer: Self-pay | Admitting: Podiatry

## 2013-06-04 LAB — WOUND CULTURE: Culture: NO GROWTH

## 2013-06-07 LAB — ANAEROBIC CULTURE

## 2013-07-19 ENCOUNTER — Ambulatory Visit (HOSPITAL_COMMUNITY)
Admission: RE | Admit: 2013-07-19 | Discharge: 2013-07-19 | Disposition: A | Discharge status: Home / Self Care | Payer: Medicare HMO | Source: Ambulatory Visit | Attending: Podiatry | Admitting: Podiatry

## 2013-07-19 ENCOUNTER — Encounter (HOSPITAL_COMMUNITY): Payer: Self-pay | Admitting: Pharmacy Technician

## 2013-07-19 ENCOUNTER — Encounter (HOSPITAL_COMMUNITY): Payer: Self-pay

## 2013-07-19 ENCOUNTER — Other Ambulatory Visit: Payer: Self-pay | Admitting: Podiatry

## 2013-07-19 ENCOUNTER — Encounter (HOSPITAL_COMMUNITY)
Admission: RE | Admit: 2013-07-19 | Discharge: 2013-07-19 | Disposition: A | Discharge status: Home / Self Care | Payer: Medicare HMO | Source: Ambulatory Visit | Attending: Podiatry | Admitting: Podiatry

## 2013-07-19 DIAGNOSIS — S98139A Complete traumatic amputation of one unspecified lesser toe, initial encounter: Secondary | ICD-10-CM | POA: Insufficient documentation

## 2013-07-19 DIAGNOSIS — M7989 Other specified soft tissue disorders: Secondary | ICD-10-CM | POA: Insufficient documentation

## 2013-07-19 DIAGNOSIS — M7730 Calcaneal spur, unspecified foot: Secondary | ICD-10-CM | POA: Insufficient documentation

## 2013-07-19 DIAGNOSIS — M79609 Pain in unspecified limb: Secondary | ICD-10-CM | POA: Insufficient documentation

## 2013-07-19 HISTORY — DX: Fibromyalgia: M79.7

## 2013-07-19 NOTE — Patient Instructions (Signed)
Jyquan Harder [facility]  07/19/2013   Your procedure is scheduled on:   07/21/2013  Report to [facility] at  45  AM.  Call this number if you have problems the morning of surgery: [phone]   Remember:   Do not eat food or drink liquids after midnight.   Take these medicines the morning of surgery with A SIP OF WATER:  Zyrtec, neurontin   Do not wear jewelry, make-up or nail polish.  Do not wear lotions, powders, or perfumes.   Do not shave 48 hours prior to surgery. Men may shave face and neck.  Do not bring valuables to the hospital.  [facility] is not responsible for any belongings or valuables.               Contacts, dentures or bridgework may not be worn into surgery.  Leave suitcase in the car. After surgery it may be brought to your room.  For patients admitted to the hospital, discharge time is determined by your treatment team.               Patients discharged the day of surgery will not be allowed to drive home.  Name and phone number of your driver: family  Special Instructions: Shower using CHG 2 nights before surgery and the night before surgery.  If you shower the day of surgery use CHG.  Use special wash - you have one bottle of CHG for all showers.  You should use approximately 1/3 of the bottle for each shower.   Please read over the following fact sheets that you were given: Pain Booklet, Coughing and Deep Breathing, Surgical Site Infection Prevention, Anesthesia Post-op Instructions and Care and Recovery After Surgery Toe Injuries and Amputations You have cut off (amputated) part of your toe. Your outcome depends largely on how much was amputated. If just the tip is amputated, often the end of the toe will grow back and the toe may return much to the same as it was before the injury. If more of the toe is missing, your caregiver has done the best with the tissue remaining to allow you to keep as much toe as is possible or has finished the amputation at a level that  will leave you with the most functional toe. This means a toe that will work the best for you. Please read the instructions outlined below and refer to this sheet in the next few weeks. These instructions provide you with general information on caring for yourself. Your caregiver may also give you specific instructions. While your treatment has been done according to the most current medical practices available, unavoidable complications occasionally occur. If you have any problems or questions after discharge, call your caregiver. HOME CARE INSTRUCTIONS   You may resume a normal diet and activities as directed or allowed.  Keep your foot elevated when possible. This helps decrease pain and swelling.  Keep ice packs (a bag of ice wrapped in a towel) on the injured area for 15-20 minutes, 03-04 times per day, for the first two days. Use ice only if OK with your caregiver.  Change dressings if necessary or as directed.  Clean the wounded area as directed.  Only take over-the-counter or prescription medicines for pain, discomfort, or fever as directed by your caregiver.  Keep appointments as directed. SEEK IMMEDIATE MEDICAL CARE IF:  There is redness, swelling, numbness or increasing pain in the wound.  There is pus coming from wound.  You have an unexplained oral temperature above 102 F (38.9 C) or as your caregiver suggests.  There is a bad (foul) smell coming from the wound or dressing.  The edges of the wound break open (the edges are not staying together) after sutures or staples have been removed. Document Released: 12/11/2004 Document Revised: 04/14/2011 Document Reviewed: 05/10/2008 [facility] Patient Information 2014 [place], [place]. PATIENT INSTRUCTIONS POST-ANESTHESIA  IMMEDIATELY FOLLOWING SURGERY:  Do not drive or operate machinery for the first twenty four hours after surgery.  Do not make any important decisions for twenty four hours after surgery or while taking narcotic  pain medications or sedatives.  If you develop intractable nausea and vomiting or a severe headache please notify your doctor immediately.  FOLLOW-UP:  Please make an appointment with your surgeon as instructed. You do not need to follow up with anesthesia unless specifically instructed to do so.  WOUND CARE INSTRUCTIONS (if applicable):  Keep a dry clean dressing on the anesthesia/puncture wound site if there is drainage.  Once the wound has quit draining you may leave it open to air.  Generally you should leave the bandage intact for twenty four hours unless there is drainage.  If the epidural site drains for more than 36-48 hours please call the anesthesia department.  QUESTIONS?:  Please feel free to call your physician or the hospital operator if you have any questions, and they will be happy to assist you.

## 2013-07-19 NOTE — Pre-Procedure Instructions (Signed)
Patient given information to sign up for my chart at home. 

## 2013-07-21 ENCOUNTER — Encounter (HOSPITAL_COMMUNITY): Payer: Medicare HMO | Admitting: Anesthesiology

## 2013-07-21 ENCOUNTER — Encounter (HOSPITAL_COMMUNITY): Payer: Self-pay | Admitting: *Deleted

## 2013-07-21 ENCOUNTER — Encounter (HOSPITAL_COMMUNITY)
Admission: RE | Disposition: A | Discharge status: Home / Self Care | Payer: Self-pay | Source: Ambulatory Visit | Attending: Podiatry

## 2013-07-21 ENCOUNTER — Ambulatory Visit (HOSPITAL_COMMUNITY)
Admission: RE | Admit: 2013-07-21 | Discharge: 2013-07-21 | Disposition: A | Discharge status: Home / Self Care | Payer: Medicare HMO | Source: Ambulatory Visit | Attending: Podiatry | Admitting: Podiatry

## 2013-07-21 ENCOUNTER — Ambulatory Visit (HOSPITAL_COMMUNITY): Payer: Medicare HMO

## 2013-07-21 ENCOUNTER — Ambulatory Visit (HOSPITAL_COMMUNITY): Payer: Medicare HMO | Admitting: Anesthesiology

## 2013-07-21 DIAGNOSIS — Z87891 Personal history of nicotine dependence: Secondary | ICD-10-CM | POA: Insufficient documentation

## 2013-07-21 DIAGNOSIS — I10 Essential (primary) hypertension: Secondary | ICD-10-CM | POA: Insufficient documentation

## 2013-07-21 DIAGNOSIS — K219 Gastro-esophageal reflux disease without esophagitis: Secondary | ICD-10-CM | POA: Insufficient documentation

## 2013-07-21 DIAGNOSIS — M86179 Other acute osteomyelitis, unspecified ankle and foot: Secondary | ICD-10-CM | POA: Insufficient documentation

## 2013-07-21 DIAGNOSIS — E1142 Type 2 diabetes mellitus with diabetic polyneuropathy: Secondary | ICD-10-CM | POA: Insufficient documentation

## 2013-07-21 DIAGNOSIS — R51 Headache: Secondary | ICD-10-CM | POA: Insufficient documentation

## 2013-07-21 DIAGNOSIS — L97509 Non-pressure chronic ulcer of other part of unspecified foot with unspecified severity: Secondary | ICD-10-CM | POA: Insufficient documentation

## 2013-07-21 DIAGNOSIS — E1149 Type 2 diabetes mellitus with other diabetic neurological complication: Secondary | ICD-10-CM | POA: Insufficient documentation

## 2013-07-21 DIAGNOSIS — E1169 Type 2 diabetes mellitus with other specified complication: Secondary | ICD-10-CM | POA: Insufficient documentation

## 2013-07-21 DIAGNOSIS — M9080 Osteopathy in diseases classified elsewhere, unspecified site: Secondary | ICD-10-CM | POA: Insufficient documentation

## 2013-07-21 DIAGNOSIS — M869 Osteomyelitis, unspecified: Secondary | ICD-10-CM

## 2013-07-21 HISTORY — PX: AMPUTATION: SHX166

## 2013-07-21 LAB — GLUCOSE, CAPILLARY
Glucose-Capillary: 168 mg/dL — ABNORMAL HIGH (ref 70–99)
Glucose-Capillary: 165 mg/dL — ABNORMAL HIGH (ref 70–99)

## 2013-07-21 SURGERY — AMPUTATION DIGIT
Anesthesia: Monitor Anesthesia Care | Site: Foot | Laterality: Left

## 2013-07-21 MED ORDER — FENTANYL CITRATE 0.05 MG/ML IJ SOLN
25.0000 ug | INTRAMUSCULAR | Status: AC
  Administered 2013-07-21 (×2): 25 ug via INTRAVENOUS

## 2013-07-21 MED ORDER — PROPOFOL 10 MG/ML IV EMUL
INTRAVENOUS | Status: AC
  Filled 2013-07-21: qty 20

## 2013-07-21 MED ORDER — MIDAZOLAM HCL 2 MG/2ML IJ SOLN
INTRAMUSCULAR | Status: AC
  Filled 2013-07-21: qty 2

## 2013-07-21 MED ORDER — ONDANSETRON HCL 4 MG/2ML IJ SOLN
INTRAMUSCULAR | Status: AC
  Filled 2013-07-21: qty 2

## 2013-07-21 MED ORDER — BUPIVACAINE HCL (PF) 0.5 % IJ SOLN
INTRAMUSCULAR | Status: DC | PRN
  Administered 2013-07-21: 20 mL

## 2013-07-21 MED ORDER — LACTATED RINGERS IV SOLN
INTRAVENOUS | Status: DC | PRN
  Administered 2013-07-21: 08:00:00 via INTRAVENOUS

## 2013-07-21 MED ORDER — ONDANSETRON HCL 4 MG/2ML IJ SOLN
4.0000 mg | Freq: Once | INTRAMUSCULAR | Status: AC
Start: 2013-07-21 — End: 2013-07-21
  Administered 2013-07-21: 4 mg via INTRAVENOUS

## 2013-07-21 MED ORDER — ONDANSETRON HCL 4 MG/2ML IJ SOLN: 4.0000 mg | Freq: Once | INTRAMUSCULAR | Status: DC | PRN

## 2013-07-21 MED ORDER — FENTANYL CITRATE 0.05 MG/ML IJ SOLN
INTRAMUSCULAR | Status: AC
  Filled 2013-07-21: qty 2

## 2013-07-21 MED ORDER — FENTANYL CITRATE 0.05 MG/ML IJ SOLN
INTRAMUSCULAR | Status: DC | PRN
  Administered 2013-07-21 (×2): 50 ug via INTRAVENOUS

## 2013-07-21 MED ORDER — FENTANYL CITRATE 0.05 MG/ML IJ SOLN: 25.0000 ug | INTRAMUSCULAR | Status: DC | PRN

## 2013-07-21 MED ORDER — PROPOFOL INFUSION 10 MG/ML OPTIME
INTRAVENOUS | Status: DC | PRN
  Administered 2013-07-21: 75 ug/kg/min via INTRAVENOUS

## 2013-07-21 MED ORDER — 0.9 % SODIUM CHLORIDE (POUR BTL) OPTIME
TOPICAL | Status: DC | PRN
  Administered 2013-07-21: 1000 mL

## 2013-07-21 MED ORDER — MIDAZOLAM HCL 5 MG/5ML IJ SOLN
INTRAMUSCULAR | Status: AC
  Filled 2013-07-21: qty 5

## 2013-07-21 MED ORDER — BUPIVACAINE HCL (PF) 0.5 % IJ SOLN
INTRAMUSCULAR | Status: AC
  Filled 2013-07-21: qty 30

## 2013-07-21 MED ORDER — LIDOCAINE HCL (PF) 1 % IJ SOLN
INTRAMUSCULAR | Status: AC
  Filled 2013-07-21: qty 30

## 2013-07-21 MED ORDER — MIDAZOLAM HCL 5 MG/5ML IJ SOLN
INTRAMUSCULAR | Status: DC | PRN
  Administered 2013-07-21: 2 mg via INTRAVENOUS

## 2013-07-21 MED ORDER — MIDAZOLAM HCL 2 MG/2ML IJ SOLN
1.0000 mg | INTRAMUSCULAR | Status: DC | PRN
  Administered 2013-07-21: 2 mg via INTRAVENOUS

## 2013-07-21 MED ORDER — VANCOMYCIN HCL 10 G IV SOLR
2000.0000 mg | Freq: Once | INTRAVENOUS | Status: AC
  Administered 2013-07-21: 2000 mg via INTRAVENOUS
  Filled 2013-07-21: qty 2000

## 2013-07-21 MED ORDER — LACTATED RINGERS IV SOLN
INTRAVENOUS | Status: DC
  Administered 2013-07-21: 1000 mL via INTRAVENOUS

## 2013-07-21 MED ORDER — VANCOMYCIN HCL IN DEXTROSE 1-5 GM/200ML-% IV SOLN
INTRAVENOUS | Status: AC
  Filled 2013-07-21: qty 400

## 2013-07-21 SURGICAL SUPPLY — 39 items
APL SKNCLS STERI-STRIP NONHPOA (GAUZE/BANDAGES/DRESSINGS) ×1
BAG HAMPER (MISCELLANEOUS) ×2 IMPLANT
BANDAGE ELASTIC 4 VELCRO NS (GAUZE/BANDAGES/DRESSINGS) ×2 IMPLANT
BANDAGE ESMARK 4X12 BL STRL LF (DISPOSABLE) ×1 IMPLANT
BENZOIN TINCTURE PRP APPL 2/3 (GAUZE/BANDAGES/DRESSINGS) ×1 IMPLANT
BLADE 15 SAFETY STRL DISP (BLADE) ×4 IMPLANT
BLADE SURG 15 STRL LF DISP TIS (BLADE) IMPLANT
BLADE SURG 15 STRL SS (BLADE) ×2
BNDG CMPR 12X4 ELC STRL LF (DISPOSABLE) ×1
BNDG CONFORM 2 STRL LF (GAUZE/BANDAGES/DRESSINGS) ×2 IMPLANT
BNDG ESMARK 4X12 BLUE STRL LF (DISPOSABLE) ×2
BNDG GAUZE ELAST 4 BULKY (GAUZE/BANDAGES/DRESSINGS) ×2 IMPLANT
CLOTH BEACON ORANGE TIMEOUT ST (SAFETY) ×2 IMPLANT
COVER LIGHT HANDLE STERIS (MISCELLANEOUS) ×4 IMPLANT
CUFF TOURNIQUET SINGLE 18IN (TOURNIQUET CUFF) ×2 IMPLANT
DECANTER SPIKE VIAL GLASS SM (MISCELLANEOUS) ×2 IMPLANT
DRSG ADAPTIC 3X8 NADH LF (GAUZE/BANDAGES/DRESSINGS) ×2 IMPLANT
ELECT REM PT RETURN 9FT ADLT (ELECTROSURGICAL) ×2
ELECTRODE REM PT RTRN 9FT ADLT (ELECTROSURGICAL) ×1 IMPLANT
FORMALIN 10 PREFIL 120ML (MISCELLANEOUS) ×1 IMPLANT
GAUZE SPONGE 4X4 12PLY STRL (GAUZE/BANDAGES/DRESSINGS) ×2 IMPLANT
GLOVE BIO SURGEON STRL SZ7.5 (GLOVE) ×2 IMPLANT
GLOVE BIOGEL PI IND STRL 7.5 (GLOVE) IMPLANT
GLOVE BIOGEL PI INDICATOR 7.5 (GLOVE) ×1
GLOVE EXAM NITRILE MD LF STRL (GLOVE) ×1 IMPLANT
GLOVE SURG SS PI 7.5 STRL IVOR (GLOVE) ×1 IMPLANT
GOWN STRL REUS W/TWL LRG LVL3 (GOWN DISPOSABLE) ×4 IMPLANT
KIT ROOM TURNOVER APOR (KITS) ×2 IMPLANT
MANIFOLD NEPTUNE II (INSTRUMENTS) ×2 IMPLANT
NDL HYPO 27GX1-1/4 (NEEDLE) ×2 IMPLANT
NEEDLE HYPO 27GX1-1/4 (NEEDLE) ×4 IMPLANT
NS IRRIG 1000ML POUR BTL (IV SOLUTION) ×2 IMPLANT
PACK BASIC LIMB (CUSTOM PROCEDURE TRAY) ×2 IMPLANT
PAD ARMBOARD 7.5X6 YLW CONV (MISCELLANEOUS) ×2 IMPLANT
SET BASIN LINEN APH (SET/KITS/TRAYS/PACK) ×2 IMPLANT
SPONGE LAP 18X18 X RAY DECT (DISPOSABLE) ×2 IMPLANT
STRIP CLOSURE SKIN 1/2X4 (GAUZE/BANDAGES/DRESSINGS) ×1 IMPLANT
SUT PROLENE 4 0 PS 2 18 (SUTURE) ×3 IMPLANT
SYR CONTROL 10ML LL (SYRINGE) ×5 IMPLANT

## 2013-07-21 NOTE — Addendum Note (Signed)
Addended by: Caprice Beaver on: 07/21/2013 11:15 AM   Modules accepted: Orders

## 2013-07-21 NOTE — Discharge Instructions (Signed)
Incision Care  An incision is when a surgeon cuts into your body tissues. After surgery, the incision needs to be cared for properly to prevent infection.   HOME CARE INSTRUCTIONS    Take all medicine as directed by your caregiver. Only take over-the-counter or prescription medicines for pain, discomfort, or fever as directed by your caregiver.   Do not remove your bandage (dressing) or get your incision wet until your surgeon gives you permission. In the event that your dressing becomes wet, dirty, or starts to smell, change the dressing and call your surgeon for instructions as soon as possible.   Take showers. Do not take tub baths, swim, or do anything that may soak the wound until it is healed.   Resume your normal diet and activities as directed or allowed.   Avoid lifting any weight until you are instructed otherwise.   Use anti-itch antihistamine medicine as directed by your caregiver. The wound may itch when it is healing. Do not pick or scratch at the wound.   Follow up with your caregiver for stitch (suture) or staple removal as directed.   Drink enough fluids to keep your urine clear or pale yellow.  SEEK MEDICAL CARE IF:    You have redness, swelling, or increasing pain in the wound that is not controlled with medicine.   You have drainage, blood, or pus coming from the wound that lasts longer than 1 day.   You develop muscle aches, chills, or a general ill feeling.   You notice a bad smell coming from the wound or dressing.   Your wound edges separate after the sutures, staples, or skin adhesive strips have been removed.   You develop persistent nausea or vomiting.  SEEK IMMEDIATE MEDICAL CARE IF:    You have a fever.   You develop a rash.   You develop dizzy episodes or faint while standing.   You have difficulty breathing.   You develop any reaction or side effects to medicine given.  MAKE SURE YOU:    Understand these instructions.   Will watch your condition.   Will get help  right away if you are not doing well or get worse.  Document Released: 08/09/2004 Document Revised: 04/14/2011 Document Reviewed: 05/26/2010  ExitCare Patient Information 2015 [employer]. This information is not intended to replace advice given to you by your health care provider. Make sure you discuss any questions you have with your health care provider.

## 2013-07-21 NOTE — H&P (Signed)
HISTORY AND PHYSICAL INTERVAL NOTE:  07/21/2013  8:28 AM  [place]  has presented today for surgery, with the diagnosis of osteomeylitis and ulceration 2nd toe left foot and diabetes mellitus with peripheral neuropathy.  The various methods of treatment have been discussed with the patient.  No guarantees were given.  After consideration of risks, benefits and other options for treatment, the patient has consented to surgery.  I have reviewed the patients' chart and labs.    Patient Vitals for the past 24 hrs:  BP Temp Temp src Pulse Resp SpO2 Height Weight  07/21/13 0815 154/96 mmHg - - - 14 98 % - -  07/21/13 0810 154/96 mmHg - - - 35 99 % - -  07/21/13 0805 164/100 mmHg - - - 22 95 % - -  07/21/13 0800 164/100 mmHg - - - 17 98 % - -  07/21/13 0755 164/100 mmHg - - - 24 98 % - -  07/21/13 0750 164/100 mmHg 97.7 F (36.5 C) Oral 65 16 97 % 6\' 6"  (1.981 m) 287 lb (130.182 kg)    A history and physical examination was performed in my office.  The patient was reexamined.  There have been no changes to this history and physical examination.  Marcheta Grammes, DPM

## 2013-07-21 NOTE — Op Note (Signed)
OPERATIVE NOTE  DATE OF PROCEDURE:  07/21/2013  SURGEON:   Marcheta Grammes, DPM  OR STAFF:   Circulator: Kristopher Oppenheim Protzek, RN Scrub Person: Romero Liner, Immunologist: Effie Berkshire, RN   PREOPERATIVE DIAGNOSIS:   1.  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  Ulceration of the 2nd toe, left foot 3.  Diabetes mellitus with peripheral neuropathy, left foot  POSTOPERATIVE DIAGNOSIS: Same  PROCEDURE: Partial amputation of the 2nd toe, left foot  ANESTHESIA:  Monitor Anesthesia Care   HEMOSTASIS:   Pneumatic ankle tourniquet set at 250 mmHg  ESTIMATED BLOOD LOSS:   Minimal (<5 cc)  MATERIALS USED:  None  INJECTABLES: Marcaine 0.5% plain; 61mL  PATHOLOGY:   Distal aspect of the 2nd toe, left foot  COMPLICATIONS:   None  INDICATIONS:  Nonhealing ulceration of the distal aspect of the left 2nd toe with erosive changes of the distal phalanx on plain film.  DESCRIPTION OF THE PROCEDURE:   The patient was brought to the operating room and placed on the operative table in the supine position.  A pneumatic ankle tourniquet was applied to the patient's ankle.  Following sedation, the surgical site was anesthetized with 0.5% Marcaine plain.  The foot was then prepped, scrubbed, and draped in the usual sterile technique.  The foot was elevated, exsanguinated and the pneumatic ankle tourniquet inflated to 250 mmHg.    Attention was directed to the left second toe.  2 converging semi-elliptical incisions were made encompassing the toe circumferentially.  Dissection was continued deep down to the level of the distal interphalangeal joint.  The distal interphalangeal joint was disarticulated and the distal aspect of the second toe was passed from the operative field and sent to pathology for evaluation.  The head of the middle phalanx inspected and found to be free of any erosive changes.  The wound was irrigated with copious amounts of sterile  irrigant.  The skin was reapproximated using 4-0 Prolene in a simple suture technique.  A sterile compressive dressing was applied to the left foot.  The pneumatic ankle tourniquet was deflated and a hyperemic response was noted to all remaining digits of the left foot.  The patient tolerated the procedure well.  The patient was then transferred to PACU with vital signs stable and vascular status intact to all toes of the operative foot.  Following a period of postoperative monitoring, the patient will be discharged home.

## 2013-07-21 NOTE — Anesthesia Preprocedure Evaluation (Signed)
Anesthesia Evaluation  Patient identified by MRN, date of birth, ID band Patient awake    Reviewed: Allergy & Precautions, H&P , NPO status , Patient's Chart, lab work & pertinent test results  Airway Mallampati: II TM Distance: >3 FB Neck ROM: Full    Dental  (+) Teeth Intact   Pulmonary former smoker,  breath sounds clear to auscultation        Cardiovascular hypertension, Pt. on medications + Peripheral Vascular Disease Rhythm:Regular Rate:Normal     Neuro/Psych  Headaches,    GI/Hepatic GERD-  Controlled and Medicated,  Endo/Other  diabetes, Type 2, Insulin Dependent, Oral Hypoglycemic AgentsMorbid obesity  Renal/GU      Musculoskeletal   Abdominal   Peds  Hematology   Anesthesia Other Findings   Reproductive/Obstetrics                           Anesthesia Physical Anesthesia Plan  ASA: III  Anesthesia Plan: MAC   Post-op Pain Management:    Induction: Intravenous  Airway Management Planned: Nasal Cannula  Additional Equipment:   Intra-op Plan:   Post-operative Plan:   Informed Consent: I have reviewed the patients History and Physical, chart, labs and discussed the procedure including the risks, benefits and alternatives for the proposed anesthesia with the patient or authorized representative who has indicated his/her understanding and acceptance.     Plan Discussed with:   Anesthesia Plan Comments:         Anesthesia Quick Evaluation

## 2013-07-21 NOTE — Anesthesia Procedure Notes (Signed)
Procedure Name: MAC Date/Time: 07/21/2013 8:48 AM Performed by: Andree Elk, AMY A Pre-anesthesia Checklist: Patient identified, Timeout performed, Emergency Drugs available, Suction available and Patient being monitored Oxygen Delivery Method: Simple face mask

## 2013-07-21 NOTE — Anesthesia Postprocedure Evaluation (Signed)
  Anesthesia Post-op Note  Patient: [name]  Procedure(s) Performed: Procedure(s): PARTIAL AMPUTATION 2ND TOE LEFT FOOT (Left)  Patient Location: PACU  Anesthesia Type:MAC  Level of Consciousness: awake, alert , oriented and patient cooperative  Airway and Oxygen Therapy: Patient Spontanous Breathing and Patient connected to nasal cannula oxygen  Post-op Pain: none  Post-op Assessment: Post-op Vital signs reviewed, Patient's Cardiovascular Status Stable, Respiratory Function Stable, Patent Airway, No signs of Nausea or vomiting and Pain level controlled  Post-op Vital Signs: Reviewed and stable  Last Vitals:  Filed Vitals:   07/21/13 0840  BP: 137/84  Pulse:   Temp:   Resp: 20    Complications: No apparent anesthesia complications

## 2013-07-21 NOTE — Transfer of Care (Signed)
Immediate Anesthesia Transfer of Care Note  Patient: [name]  Procedure(s) Performed: Procedure(s): PARTIAL AMPUTATION 2ND TOE LEFT FOOT (Left)  Patient Location: PACU  Anesthesia Type:MAC  Level of Consciousness: awake, alert  and oriented  Airway & Oxygen Therapy: Patient Spontanous Breathing and Patient connected to nasal cannula oxygen  Post-op Assessment: Report given to PACU RN and Post -op Vital signs reviewed and stable  Post vital signs: Reviewed and stable  Complications: No apparent anesthesia complications

## 2013-07-22 ENCOUNTER — Encounter (HOSPITAL_COMMUNITY): Payer: Self-pay | Admitting: Podiatry

## 2013-10-27 ENCOUNTER — Encounter: Payer: Self-pay | Admitting: Neurology

## 2013-10-27 ENCOUNTER — Encounter: Payer: Self-pay | Admitting: *Deleted

## 2013-10-28 ENCOUNTER — Encounter: Payer: Self-pay | Admitting: Neurology

## 2013-10-28 ENCOUNTER — Ambulatory Visit (INDEPENDENT_AMBULATORY_CARE_PROVIDER_SITE_OTHER): Payer: Medicare HMO | Admitting: Neurology

## 2013-10-28 VITALS — BP 130/90 | HR 70 | Ht 77.0 in | Wt 284.0 lb

## 2013-10-28 DIAGNOSIS — M5481 Occipital neuralgia: Secondary | ICD-10-CM

## 2013-10-28 DIAGNOSIS — M531 Cervicobrachial syndrome: Secondary | ICD-10-CM

## 2013-10-28 DIAGNOSIS — R51 Headache: Secondary | ICD-10-CM

## 2013-10-28 MED ORDER — AMITRIPTYLINE HCL 25 MG PO TABS: 50.0000 mg | ORAL_TABLET | Freq: Every day | ORAL | Status: DC

## 2013-10-28 MED ORDER — METHYLPREDNISOLONE (PAK) 4 MG PO TABS: ORAL_TABLET | ORAL | Status: DC

## 2013-10-28 NOTE — Progress Notes (Signed)
[facility]    Provider:  Dr Jaynee Eagles Referring Provider: Arsenio Katz, NP Primary Care Physician:  Monico Blitz, MD  CC:  Headache  HPI:  [name] is a 53 y.o. male here as a referral from Dr. Cyndi Bender for Headache  Patient reports headache. 2 months. No history of headaches. No family history of headaches. 2 months age he hit his head, "busted head open". Did not lose consciousness. No nausea. No confusion. Injury was at the back of the head. SInce then he is having headaches. They are worse in the middle of the day. Symptoms last day long, never goes away. Has been up 10/10, some nausea with the headache. When it is real bad he can't stand the light. Has tried extra strength migaine excedrin. Not taking it anymore, didn't help. Also tried imitrex didn't help. Amitriptyline 25mg  not helping. He decribes sharp pain in the back of the head. Tender to the touch, feels sore. No vision loss, no weakness, no other paresthesias, no double vision, some neck pain but not associated with the headache. Tenderness to palpation.  Reviewed notes, labs and imaging from outside physicians, which showed: Notes describe similar report as described above with constant dull pain and episodic sharp pain in the back of the head, assocoiated with some light sensitivity and nausea. He was started on Amitriptyline, imitrex and phenergan. Is on neurontin qhs, flexeril prn. Per notes. CT of the head was unremarkable and MRI of the brain pending. BMP 05/2013 with elevated glucose and reduced GFR.  Review of Systems: Patient complains of symptoms per HPI as well as the following symptoms weight loss, blurred vision, memory loss, headache, moles, not enough sleep, decreased energy, allergies. Pertinent negatives per HPI. All others negative.   History   Social History  . Marital Status: Married    Spouse Name: N/A    Number of Children: 2  . Years of Education: 10th   Occupational History    . Retired- Research officer, trade union    Social History Main Topics  . Smoking status: Former Smoker -- 3.00 packs/day for 5 years    Types: Cigarettes    Quit date: 07/20/1982  . Smokeless tobacco: Never Used     Comment: quit 1980's  . Alcohol Use: No  . Drug Use: No  . Sexual Activity: Yes    Birth Control/ Protection: None   Other Topics Concern  . Not on file   Social History Narrative  . No narrative on file    Family History  Problem Relation Age of Onset  . Heart attack Father 65  . Hyperlipidemia Father   . Hypertension Father   . Heart attack Mother 42  . Diabetes Mother   . Hypertension Mother   . Hyperlipidemia Mother   . Healthy Brother   . Seizures Brother     Past Medical History  Diagnosis Date  . Chest pain Sept. 2005    multiple caths  /  cath 06/15/2010..normal coronaries,  EF 60%   (false postive nuclear in the past)  . IDDM (insulin dependent diabetes mellitus)   . Hypertension   . Dyslipidemia     mixed  . Gastroparesis   . Pulmonary embolus     Hx of small left lower lobe  pulmonary embolus  . Overweight(278.02)   . Peripheral vascular disease   . Headache(784.0)   . Neuropathy associated with endocrine disorder   . Pulmonary embolus 2010ish  . Fibromyalgia   . Cervicalgia   .  Urticaria, unspecified   . Allergic rhinitis, cause unspecified   . Lumbago   . 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 Esophageal reflux   . Type II or unspecified type diabetes mellitus with neurological manifestations, not stated as uncontrolled     Past Surgical History  Procedure Laterality Date  . Back surgery  1999    x3  . Shoulder arthroscopy w/ rotator cuff repair Right   . Achilles tendon repair Left 2013  . Appendectomy    . Lumbar laminectomy/decompression microdiscectomy Right 01/12/2013    Procedure: Right Lumbar Three-Four Laminotomy/Foraminotomy;  Surgeon: Floyce Stakes, MD;   Location: [facility];  Service: Neurosurgery;  Laterality: Right;  Right Lumbar Three-Four Laminotomy/Foraminotomy  . 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 Nissen fundoplication    . Amputation Left 07/21/2013    Procedure: PARTIAL AMPUTATION 2ND TOE LEFT FOOT;  Surgeon: Marcheta Grammes, DPM;  Location: [facility];  Service: Podiatry;  Laterality: Left;    Current Outpatient Prescriptions  Medication Sig Dispense Refill  . amitriptyline (ELAVIL) 25 MG tablet Take 2 tablets (50 mg total) by mouth at bedtime.  60 tablet  3  . amoxicillin-clavulanate (AUGMENTIN) 500-125 MG per tablet Take 1 tablet by mouth 2 (two) times daily.      [name] atorvastatin (LIPITOR) 20 MG tablet Take 20 mg by mouth daily.      . cephALEXin (KEFLEX) 500 MG capsule Take 500 mg by mouth 4 (four) times daily.      . cetirizine (ZYRTEC) 10 MG tablet Take 10 mg by mouth daily.      [name] gabapentin (NEURONTIN) 600 MG tablet Take 2,400 mg by mouth at bedtime.      [name] glimepiride (AMARYL) 2 MG tablet Take 2 mg by mouth daily.        [name] HYDROcodone-acetaminophen (NORCO/VICODIN) 5-325 MG per tablet Take 1 tablet by mouth daily as needed.      . insulin aspart (NOVOLOG) 100 UNIT/ML injection Inject 10-50 Units into the skin 3 (three) times daily before meals. Per sliding scale.      . insulin detemir (LEVEMIR) 100 UNIT/ML injection Inject 15-50 Units into the skin 2 (two) times daily.       [name] linagliptin (TRADJENTA) 5 MG TABS tablet Take 5 mg by mouth daily.      . metFORMIN (GLUCOPHAGE) 500 MG tablet Take 1,000 mg by mouth 2 (two) times daily with a meal.      . nalbuphine (NUBAIN) 10 MG/ML injection Inject 10 mg into the vein once.      . methylPREDNIsolone (MEDROL DOSPACK) 4 MG tablet follow package directions  21 tablet  0   No current facility-administered medications for this visit.     Allergies as of 10/28/2013 - Review Complete 10/28/2013  Allergen Reaction Noted  . Reglan [metoclopramide] Itching and Rash 01/10/2013    Vitals: BP 130/90  Pulse 70  Ht 6\' 5"  (1.956 m)  Wt 284 lb (128.822 kg)  BMI 33.67 kg/m2 Last Weight:  Wt Readings from Last 1 Encounters:  10/28/13 284 lb (128.822 kg)   Last Height:   Ht Readings from Last 1 Encounters:  10/28/13 6\' 5"  (1.956 m)    Physical exam: Exam: Gen: NAD, conversant, well nourised, obese, well groomed  CV: RRR, no MRG. No Carotid Bruits. No peripheral edema, warm, nontender Eyes: Conjunctivae clear without exudates or hemorrhage Head: Tenderness to palpation in occipital area.  Neuro: Detailed Neurologic Exam  Speech:    Speech is normal; fluent and spontaneous with normal comprehension.  Cognition:    The patient is oriented to person, place, and time;     recent and remote memory intact;     language fluent;     normal attention, concentration,    fund of knowledge Cranial Nerves:    The pupils are equal, round, and reactive to light. The fundi are normal and spontaneous venous pulsations are present. Visual fields are full to finger confrontation. Extraocular movements are intact. Trigeminal sensation is intact and the muscles of mastication are normal. The face is symmetric. The palate elevates in the midline. Voice is normal. Shoulder shrug is normal. The tongue has normal motion without fasciculations.   Coordination:    Normal finger to nose and heel to shin.   Gait:    Good stride. Can't toe walk because lost left toes (stepped on a nail)   Motor Observation:    No asymmetry, no atrophy, and no involuntary movements noted. Tone:    Normal muscle tone.    Posture:    Posture is normal. normal erect    Strength:    Strength is V/V in the upper and lower limbs.      Sensation: intact     Reflex Exam:  DTR's:   Absent achilles bilat. Otherwise dep tendon reflexes  in the upper and lower extremities are normal bilaterally.   Toes:    Left big toe missing can't do babinski. Right downgoing. Clonus:    Clonus is absent.    Assessment/Plan:  54 year old with continuous dull pain in the occipital areas with episodic sharp shooting pains in the same area. Symptoms after trauma to the back of the head. Can be up to 10/10. Tenderness to palpation on exam. 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 Performed occipital nerve block in the office with headache relief. Increased Amitriptylline to 50mg  qhs and given a medrol dose pak. Follow up 3 months. Discussed most common side effectsof medications.   Patient was consented for bilateral occipital nerve blocks. A solution containing 0.5% Bupivacaine and 20mg  depo-medrol 6-cc was prepared in 2 3-CC syringes with 27 gauge 1/2 inch needle. 6 Target areas in the occipital region were identified via palpation and pain response.The sites were sterilized with alcohol wipes. 22ml was injected at each site. The contents of each syringe was injected in a fanlike fashion on each side. The headache improved from 10/10 to 4/10. Patient tolerated the procedure well and no complications were noted.    Sarina Ill, MD  [facility] [address] [place], [place] [address]  Phone [phone] Fax [phone]

## 2013-10-28 NOTE — Patient Instructions (Signed)
Overall you are doing fairly well but I do want to suggest a few things today:   Remember to drink plenty of fluid, eat healthy meals and do not skip any meals. Try to eat protein with a every meal and eat a healthy snack such as fruit or nuts in between meals. Try to keep a regular sleep-wake schedule and try to exercise daily, particularly in the form of walking, 20-30 minutes a day, if you can.   As far as your medications are concerned, I would like to suggest medrol dose pack  I would like to see you back in 3 months, sooner if we need to. Please call us with any interim questions, concerns, problems, updates or refill requests.   Please also call us for any test results so we can go over those with you on the phone.  My clinical assistant and will answer any of your questions and relay your messages to me and also relay most of my messages to you.   Our phone number is [phone]. We also have an after hours call service for urgent matters and there is a physician on-call for urgent questions. For any emergencies you know to call 911 or go to the nearest emergency room

## 2013-10-30 ENCOUNTER — Encounter: Payer: Self-pay | Admitting: Neurology

## 2013-10-30 DIAGNOSIS — M5481 Occipital neuralgia: Secondary | ICD-10-CM | POA: Insufficient documentation

## 2013-12-19 ENCOUNTER — Other Ambulatory Visit: Payer: Self-pay | Admitting: Podiatry

## 2013-12-19 DIAGNOSIS — M2031 Hallux varus (acquired), right foot: Secondary | ICD-10-CM

## 2014-01-03 NOTE — Patient Instructions (Signed)
[name]  01/03/2014   Your procedure is scheduled on:  01/11/2014  Report to [facility] [place] at 7:00 AM.  Call this number if you have problems the morning of surgery: [phone]   Remember:   Do not eat food or drink liquids after midnight.   Take these medicines the morning of surgery with A SIP OF WATER: ZYRTEC,GABAPENTIN,HYDROCODONE   DO NOT TAKE INSULIN, AMARYL, TRADJENTA OR METFORMIN DAY OF SURGERY.  TAKE ONLY 1/2 DOSE OF INSULIN 12/8 PM   Do not wear jewelry, make-up or nail polish.  Do not wear lotions, powders, or perfumes. You may wear deodorant.  Do not shave 48 hours prior to surgery. Men may shave face and neck.  Do not bring valuables to the hospital.  [facility] is not responsible for any belongings or valuables.               Contacts, dentures or bridgework may not be worn into surgery.  Leave suitcase in the car. After surgery it may be brought to your room.  For patients admitted to the hospital, discharge time is determined by your treatment team.               Patients discharged the day of surgery will not be allowed to drive home.  Name and phone number of your driver:   Special Instructions: Shower using CHG 2 nights before surgery and the night before surgery.  If you shower the day of surgery use CHG.  Use special wash - you have one bottle of CHG for all showers.  You should use approximately 1/3 of the bottle for each shower.   Please read over the following fact sheets that you were given: Surgical Site Infection Prevention and Anesthesia Post-op Instructions   PATIENT INSTRUCTIONS POST-ANESTHESIA  IMMEDIATELY FOLLOWING SURGERY:  Do not drive or operate machinery for the first twenty four hours after surgery.  Do not make any important decisions for twenty four hours after surgery or while taking narcotic pain medications or sedatives.  If you develop intractable nausea and vomiting or a severe headache please notify your doctor  immediately.  FOLLOW-UP:  Please make an appointment with your surgeon as instructed. You do not need to follow up with anesthesia unless specifically instructed to do so.  WOUND CARE INSTRUCTIONS (if applicable):  Keep a dry clean dressing on the anesthesia/puncture wound site if there is drainage.  Once the wound has quit draining you may leave it open to air.  Generally you should leave the bandage intact for twenty four hours unless there is drainage.  If the epidural site drains for more than 36-48 hours please call the anesthesia department.  QUESTIONS?:  Please feel free to call your physician or the hospital operator if you have any questions, and they will be happy to assist you.

## 2014-01-04 ENCOUNTER — Encounter (HOSPITAL_COMMUNITY): Payer: Self-pay

## 2014-01-04 ENCOUNTER — Ambulatory Visit (HOSPITAL_COMMUNITY)
Admission: RE | Admit: 2014-01-04 | Discharge: 2014-01-04 | Disposition: A | Discharge status: Home / Self Care | Payer: Medicare HMO | Source: Ambulatory Visit | Attending: Podiatry | Admitting: Podiatry

## 2014-01-04 ENCOUNTER — Encounter (HOSPITAL_COMMUNITY)
Admission: RE | Admit: 2014-01-04 | Discharge: 2014-01-04 | Disposition: A | Discharge status: Home / Self Care | Payer: Medicare HMO | Source: Ambulatory Visit | Attending: Podiatry | Admitting: Podiatry

## 2014-01-04 DIAGNOSIS — Z01812 Encounter for preprocedural laboratory examination: Secondary | ICD-10-CM | POA: Insufficient documentation

## 2014-01-04 DIAGNOSIS — M2041 Other hammer toe(s) (acquired), right foot: Secondary | ICD-10-CM | POA: Insufficient documentation

## 2014-01-04 DIAGNOSIS — Z01818 Encounter for other preprocedural examination: Secondary | ICD-10-CM | POA: Insufficient documentation

## 2014-01-04 LAB — BASIC METABOLIC PANEL
Anion gap: 18 — ABNORMAL HIGH (ref 5–15)
BUN: 21 mg/dL (ref 6–23)
CALCIUM: 9.8 mg/dL (ref 8.4–10.5)
CO2: 21 mEq/L (ref 19–32)
Chloride: 98 mEq/L (ref 96–112)
Creatinine, Ser: 1.12 mg/dL (ref 0.50–1.35)
GFR, EST AFRICAN AMERICAN: 84 mL/min — AB (ref >90)
GFR, EST NON AFRICAN AMERICAN: 73 mL/min — AB (ref >90)
Glucose, Bld: 236 mg/dL — ABNORMAL HIGH (ref 70–99)
POTASSIUM: 4.1 meq/L (ref 3.7–5.3)
SODIUM: 137 meq/L (ref 137–147)

## 2014-01-04 LAB — CBC
HCT: 43 % (ref 39.0–52.0)
Hemoglobin: 14.8 g/dL (ref 13.0–17.0)
MCH: 28.1 pg (ref 26.0–34.0)
MCHC: 34.4 g/dL (ref 30.0–36.0)
MCV: 81.6 fL (ref 78.0–100.0)
PLATELETS: 156 10*3/uL (ref 150–400)
RBC: 5.27 MIL/uL (ref 4.22–5.81)
RDW: 13.9 % (ref 11.5–15.5)
WBC: 6 10*3/uL (ref 4.0–10.5)

## 2014-01-11 ENCOUNTER — Ambulatory Visit (HOSPITAL_COMMUNITY): Payer: Medicare HMO

## 2014-01-11 ENCOUNTER — Encounter (HOSPITAL_COMMUNITY)
Admission: RE | Disposition: A | Discharge status: Home / Self Care | Payer: Self-pay | Source: Ambulatory Visit | Attending: Podiatry

## 2014-01-11 ENCOUNTER — Ambulatory Visit (HOSPITAL_COMMUNITY)
Admission: RE | Admit: 2014-01-11 | Discharge: 2014-01-11 | Disposition: A | Discharge status: Home / Self Care | Payer: Medicare HMO | Source: Ambulatory Visit | Attending: Podiatry | Admitting: Podiatry

## 2014-01-11 ENCOUNTER — Encounter (HOSPITAL_COMMUNITY): Payer: Self-pay | Admitting: *Deleted

## 2014-01-11 ENCOUNTER — Ambulatory Visit (HOSPITAL_COMMUNITY): Payer: Medicare HMO | Admitting: Anesthesiology

## 2014-01-11 DIAGNOSIS — Z981 Arthrodesis status: Secondary | ICD-10-CM | POA: Diagnosis not present

## 2014-01-11 DIAGNOSIS — E669 Obesity, unspecified: Secondary | ICD-10-CM | POA: Diagnosis not present

## 2014-01-11 DIAGNOSIS — Z87891 Personal history of nicotine dependence: Secondary | ICD-10-CM | POA: Diagnosis not present

## 2014-01-11 DIAGNOSIS — M2041 Other hammer toe(s) (acquired), right foot: Secondary | ICD-10-CM | POA: Insufficient documentation

## 2014-01-11 DIAGNOSIS — Z794 Long term (current) use of insulin: Secondary | ICD-10-CM | POA: Insufficient documentation

## 2014-01-11 DIAGNOSIS — E1140 Type 2 diabetes mellitus with diabetic neuropathy, unspecified: Secondary | ICD-10-CM | POA: Insufficient documentation

## 2014-01-11 DIAGNOSIS — M2031 Hallux varus (acquired), right foot: Secondary | ICD-10-CM

## 2014-01-11 DIAGNOSIS — Z9889 Other specified postprocedural states: Secondary | ICD-10-CM

## 2014-01-11 DIAGNOSIS — I10 Essential (primary) hypertension: Secondary | ICD-10-CM | POA: Diagnosis not present

## 2014-01-11 DIAGNOSIS — K219 Gastro-esophageal reflux disease without esophagitis: Secondary | ICD-10-CM | POA: Insufficient documentation

## 2014-01-11 HISTORY — PX: FOOT ARTHRODESIS: SHX1655

## 2014-01-11 LAB — GLUCOSE, CAPILLARY: Glucose-Capillary: 206 mg/dL — ABNORMAL HIGH (ref 70–99)

## 2014-01-11 SURGERY — FUSION, JOINT, FOOT
Anesthesia: Monitor Anesthesia Care | Site: Toe | Laterality: Right

## 2014-01-11 MED ORDER — FENTANYL CITRATE 0.05 MG/ML IJ SOLN
INTRAMUSCULAR | Status: DC | PRN
  Administered 2014-01-11: 50 ug via INTRAVENOUS

## 2014-01-11 MED ORDER — MIDAZOLAM HCL 5 MG/5ML IJ SOLN
INTRAMUSCULAR | Status: DC | PRN
  Administered 2014-01-11: 2 mg via INTRAVENOUS

## 2014-01-11 MED ORDER — FENTANYL CITRATE 0.05 MG/ML IJ SOLN
25.0000 ug | INTRAMUSCULAR | Status: AC
  Administered 2014-01-11: 25 ug via INTRAVENOUS

## 2014-01-11 MED ORDER — MIDAZOLAM HCL 2 MG/2ML IJ SOLN
INTRAMUSCULAR | Status: AC
  Filled 2014-01-11: qty 2

## 2014-01-11 MED ORDER — FENTANYL CITRATE 0.05 MG/ML IJ SOLN: 25.0000 ug | INTRAMUSCULAR | Status: DC | PRN

## 2014-01-11 MED ORDER — MIDAZOLAM HCL 2 MG/2ML IJ SOLN
1.0000 mg | INTRAMUSCULAR | Status: DC | PRN
  Administered 2014-01-11: 2 mg via INTRAVENOUS

## 2014-01-11 MED ORDER — BUPIVACAINE HCL (PF) 0.5 % IJ SOLN
INTRAMUSCULAR | Status: AC
  Filled 2014-01-11: qty 30

## 2014-01-11 MED ORDER — ONDANSETRON HCL 4 MG/2ML IJ SOLN: 4.0000 mg | Freq: Once | INTRAMUSCULAR | Status: DC | PRN

## 2014-01-11 MED ORDER — LIDOCAINE HCL (PF) 1 % IJ SOLN
INTRAMUSCULAR | Status: AC
  Filled 2014-01-11: qty 5

## 2014-01-11 MED ORDER — FENTANYL CITRATE 0.05 MG/ML IJ SOLN
INTRAMUSCULAR | Status: AC
  Filled 2014-01-11: qty 2

## 2014-01-11 MED ORDER — PROPOFOL 10 MG/ML IV BOLUS
INTRAVENOUS | Status: DC | PRN
  Administered 2014-01-11 (×2): 20 mg via INTRAVENOUS

## 2014-01-11 MED ORDER — PROPOFOL INFUSION 10 MG/ML OPTIME
INTRAVENOUS | Status: DC | PRN
  Administered 2014-01-11: 125 ug/kg/min via INTRAVENOUS
  Administered 2014-01-11: 09:00:00 via INTRAVENOUS

## 2014-01-11 MED ORDER — CEFAZOLIN SODIUM-DEXTROSE 2-3 GM-% IV SOLR
2.0000 g | Freq: Once | INTRAVENOUS | Status: AC
  Administered 2014-01-11: 2 g via INTRAVENOUS
  Filled 2014-01-11: qty 50

## 2014-01-11 MED ORDER — PROPOFOL 10 MG/ML IV BOLUS
INTRAVENOUS | Status: AC
  Filled 2014-01-11: qty 20

## 2014-01-11 MED ORDER — SODIUM CHLORIDE 0.9 % IR SOLN
Status: DC | PRN
  Administered 2014-01-11: 1000 mL

## 2014-01-11 MED ORDER — LACTATED RINGERS IV SOLN
INTRAVENOUS | Status: DC
  Administered 2014-01-11: 1000 mL via INTRAVENOUS

## 2014-01-11 MED ORDER — BUPIVACAINE HCL (PF) 0.5 % IJ SOLN
INTRAMUSCULAR | Status: DC | PRN
  Administered 2014-01-11: 20 mL

## 2014-01-11 SURGICAL SUPPLY — 56 items
APL SKNCLS STERI-STRIP NONHPOA (GAUZE/BANDAGES/DRESSINGS) ×1
BAG HAMPER (MISCELLANEOUS) ×2 IMPLANT
BANDAGE ELASTIC 4 VELCRO NS (GAUZE/BANDAGES/DRESSINGS) ×3 IMPLANT
BANDAGE ESMARK 4X12 BL STRL LF (DISPOSABLE) ×1 IMPLANT
BANDAGE GAUZE ELAST BULKY 4 IN (GAUZE/BANDAGES/DRESSINGS) ×2 IMPLANT
BENZOIN TINCTURE PRP APPL 2/3 (GAUZE/BANDAGES/DRESSINGS) ×2 IMPLANT
BIT DRILL 2 (BIT) ×1 IMPLANT
BLADE 15 SAFETY STRL DISP (BLADE) ×2 IMPLANT
BLADE AVERAGE 25X9 (BLADE) ×2 IMPLANT
BLADE SURG 15 STRL LF DISP TIS (BLADE) IMPLANT
BLADE SURG 15 STRL SS (BLADE) ×4
BNDG CMPR 12X4 ELC STRL LF (DISPOSABLE) ×1
BNDG CONFORM 2 STRL LF (GAUZE/BANDAGES/DRESSINGS) ×3 IMPLANT
BNDG ESMARK 4X12 BLUE STRL LF (DISPOSABLE) ×2
BNDG GAUZE ELAST 4 BULKY (GAUZE/BANDAGES/DRESSINGS) ×3 IMPLANT
CHLORAPREP W/TINT 26ML (MISCELLANEOUS) ×2 IMPLANT
CLOTH BEACON ORANGE TIMEOUT ST (SAFETY) ×2 IMPLANT
COVER LIGHT HANDLE STERIS (MISCELLANEOUS) ×4 IMPLANT
CUFF TOURNIQUET SINGLE 18IN (TOURNIQUET CUFF) ×1 IMPLANT
DECANTER SPIKE VIAL GLASS SM (MISCELLANEOUS) ×2 IMPLANT
DRAPE OEC MINIVIEW 54X84 (DRAPES) ×2 IMPLANT
DRSG ADAPTIC 3X8 NADH LF (GAUZE/BANDAGES/DRESSINGS) ×2 IMPLANT
DURA STEPPER LG (CAST SUPPLIES) IMPLANT
DURA STEPPER MED (CAST SUPPLIES) IMPLANT
DURA STEPPER SML (CAST SUPPLIES) IMPLANT
DURA STEPPER XL (SOFTGOODS) ×1 IMPLANT
ELECT REM PT RETURN 9FT ADLT (ELECTROSURGICAL) ×2
ELECTRODE REM PT RTRN 9FT ADLT (ELECTROSURGICAL) ×1 IMPLANT
GAUZE SPONGE 4X4 12PLY STRL (GAUZE/BANDAGES/DRESSINGS) ×2 IMPLANT
GLOVE BIO SURGEON STRL SZ7.5 (GLOVE) ×2 IMPLANT
GLOVE BIOGEL PI IND STRL 7.0 (GLOVE) IMPLANT
GLOVE BIOGEL PI IND STRL 7.5 (GLOVE) IMPLANT
GLOVE BIOGEL PI INDICATOR 7.0 (GLOVE) ×1
GLOVE BIOGEL PI INDICATOR 7.5 (GLOVE) ×1
GLOVE ECLIPSE 6.5 STRL STRAW (GLOVE) ×1 IMPLANT
GLOVE EXAM NITRILE MD LF STRL (GLOVE) ×1 IMPLANT
GLOVE SURG SS PI 7.5 STRL IVOR (GLOVE) ×1 IMPLANT
GOWN STRL REUS W/TWL LRG LVL3 (GOWN DISPOSABLE) ×6 IMPLANT
GUIDEWIRE THREADED 1.1X150MM (WIRE) ×1 IMPLANT
KIT ROOM TURNOVER AP CYSTO (KITS) ×2 IMPLANT
MANIFOLD NEPTUNE II (INSTRUMENTS) ×2 IMPLANT
NDL HYPO 27GX1-1/4 (NEEDLE) ×3 IMPLANT
NEEDLE HYPO 27GX1-1/4 (NEEDLE) ×4 IMPLANT
NS IRRIG 1000ML POUR BTL (IV SOLUTION) ×2 IMPLANT
PACK BASIC LIMB (CUSTOM PROCEDURE TRAY) ×2 IMPLANT
PAD ARMBOARD 7.5X6 YLW CONV (MISCELLANEOUS) ×2 IMPLANT
RASP SM TEAR CROSS CUT (RASP) IMPLANT
SET BASIN LINEN APH (SET/KITS/TRAYS/PACK) ×2 IMPLANT
SPONGE GAUZE 4X4 12PLY (GAUZE/BANDAGES/DRESSINGS) ×1 IMPLANT
STAPLE FIXATION 8X8X8 (Staple) ×2 IMPLANT
STRIP CLOSURE SKIN 1/2X4 (GAUZE/BANDAGES/DRESSINGS) ×3 IMPLANT
SUT PROLENE 4 0 PS 2 18 (SUTURE) ×3 IMPLANT
SUT VIC AB 2-0 CT2 27 (SUTURE) ×1 IMPLANT
SUT VIC AB 4-0 PS2 27 (SUTURE) ×2 IMPLANT
SUT VICRYL AB 3-0 FS1 BRD 27IN (SUTURE) ×1 IMPLANT
SYRINGE 10CC LL (SYRINGE) ×4 IMPLANT

## 2014-01-11 NOTE — Anesthesia Preprocedure Evaluation (Signed)
Anesthesia Evaluation  Patient identified by MRN, date of birth, ID band Patient awake    Reviewed: Allergy & Precautions, H&P , NPO status , Patient's Chart, lab work & pertinent test results  Airway Mallampati: II  TM Distance: >3 FB Neck ROM: Full    Dental  (+) Teeth Intact   Pulmonary former smoker, PE breath sounds clear to auscultation        Cardiovascular hypertension, Pt. on medications + angina (neg cath, EF 60%) + Peripheral Vascular Disease Rhythm:Regular Rate:Normal     Neuro/Psych  Headaches,    GI/Hepatic GERD-  Controlled and Medicated,  Endo/Other  diabetes, Type 2, Insulin Dependent, Oral Hypoglycemic AgentsMorbid obesity  Renal/GU      Musculoskeletal   Abdominal   Peds  Hematology   Anesthesia Other Findings   Reproductive/Obstetrics                             Anesthesia Physical Anesthesia Plan  ASA: III  Anesthesia Plan: MAC   Post-op Pain Management:    Induction: Intravenous  Airway Management Planned: Nasal Cannula  Additional Equipment:   Intra-op Plan:   Post-operative Plan:   Informed Consent: I have reviewed the patients History and Physical, chart, labs and discussed the procedure including the risks, benefits and alternatives for the proposed anesthesia with the patient or authorized representative who has indicated his/her understanding and acceptance.     Plan Discussed with:   Anesthesia Plan Comments:         Anesthesia Quick Evaluation

## 2014-01-11 NOTE — Op Note (Signed)
OPERATIVE NOTE  DATE OF PROCEDURE:  01/11/2014  SURGEON:   Marcheta Grammes, DPM  OR STAFF:   Circulator: Effie Berkshire, RN Scrub Person: Romero Liner, CST RN First Assistant: Towanda Malkin, RN   PREOPERATIVE DIAGNOSIS:   1.  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.42)  POSTOPERATIVE DIAGNOSIS: Same  PROCEDURE: Arthrodesis of the interphalangeal joint of the hallux, right foot (CPT [id number])  ANESTHESIA:  Monitor Anesthesia Care   HEMOSTASIS:   Pneumatic ankle tourniquet set at 250 mmHg  ESTIMATED BLOOD LOSS:   Minimal (<5 cc)  MATERIALS USED:  Stryker EasyClip Staple  INJECTABLES: Marcaine 0.5% plain; 18mL  PATHOLOGY:   None  COMPLICATIONS:   None  INDICATIONS:  Contracture of the interphalangeal joint of the right hallux  DESCRIPTION OF THE PROCEDURE:   The patient was brought to the operating room and placed on the operative table in the supine position.  A pneumatic ankle tourniquet was applied to the patient's ankle.  Following sedation, the surgical site was anesthetized with 0.5% Marcaine plain.  The foot was then prepped, scrubbed, and draped in the usual sterile technique.  The foot was elevated, exsanguinated and the pneumatic ankle tourniquet inflated to 250 mmHg.    Attention was directed to the dorsal aspect of the right great toe.  A linear longitudinal incision was performed medial and parallel to the extensor hallucis longus tendon.  The incision was deepened through subcutaneous tissues.  Vital neurovascular structures were identified and retracted.  All bleeders were identified and cauterized.    The incision was deepened to the level of the interphalangeal joint.  A Z-shaped tenotomy of the extensor hallucis longus tendon was performed and reflected to expose the interphalangeal joint.  A transverse capsulotomy was performed.  Utilizing a sagittal saw, the articular  surface was resected from the head of the proximal phalanx and base of the distal phalanx.  The head of the proximal phalanx was fenestrated with a 0.062 in K wire.  The base of the distal phalanx was fenestrated with a 0.062 inch K wire.  The arthrodesis site was fixated with 2 Stryker EasyClip staples.  Position of the staples was confirmed with intraoperative fluoroscopy.    The surgical field was copiously irrigated.  The extensor tendon was reapproximated with 4-0 Vicryl suture.  The subcutaneous tissue was reapproximated with 4-0 Vicryl suture.  The skin was reapproximated with 4-0 Prolene in a simple suture technique.  The skin closure was reinforced with Steri-Strips.  A sterile compressive dressing was applied to the operative foot.  The ankle tourniquet was deflated.  A prompt hyperemic response was noted to all digits of the operative foot.    The patient tolerated the procedure well.  The patient was then transferred to PACU with vital signs stable and vascular status intact to all toes of the operative foot.  Following a period of postoperative monitoring, the patient will be discharged home.

## 2014-01-11 NOTE — H&P (Signed)
HISTORY AND PHYSICAL INTERVAL NOTE:  01/11/2014  8:22 AM  [name]  has presented today for surgery, with the diagnosis of hallux hammertoe right foot, diabetes mellitus with neuropathy.  The various methods of treatment have been discussed with the patient.  No guarantees were given.  After consideration of risks, benefits and other options for treatment, the patient has consented to surgery.  I have reviewed the patients' chart and labs.    Patient Vitals for the past 24 hrs:  BP Temp Temp src Pulse Resp SpO2  01/11/14 0754 (!) 157/97 mmHg - - - - -  01/11/14 0740 (!) 145/101 mmHg 97.7 F (36.5 C) Oral 69 19 97 %    A history and physical examination was performed in my office.  The patient was reexamined.  There have been no changes to this history and physical examination.  [name], DPM

## 2014-01-11 NOTE — Anesthesia Postprocedure Evaluation (Signed)
  Anesthesia Post-op Note  Patient: [name]  Procedure(s) Performed: Procedure(s): ARTHRODESIS INTERPHALANGEAL JOINT HALLUX RIGHT FOOT (Right)  Patient Location: PACU  Anesthesia Type:MAC  Level of Consciousness: awake, alert  and oriented  Airway and Oxygen Therapy: Patient Spontanous Breathing  Post-op Pain: none  Post-op Assessment: Post-op Vital signs reviewed, Patient's Cardiovascular Status Stable, Respiratory Function Stable, Patent Airway and No signs of Nausea or vomiting  Post-op Vital Signs: Reviewed and stable  Last Vitals:  Filed Vitals:   01/11/14 0754  BP: 157/97  Pulse:   Temp:   Resp:     Complications: No apparent anesthesia complications

## 2014-01-11 NOTE — Transfer of Care (Signed)
Immediate Anesthesia Transfer of Care Note  Patient: [name]  Procedure(s) Performed: Procedure(s): ARTHRODESIS INTERPHALANGEAL JOINT HALLUX RIGHT FOOT (Right)  Patient Location: PACU  Anesthesia Type:MAC  Level of Consciousness: awake, alert  and oriented  Airway & Oxygen Therapy: Patient Spontanous Breathing  Post-op Assessment: Report given to PACU RN  Post vital signs: Reviewed and stable  Complications: No apparent anesthesia complications

## 2014-01-12 ENCOUNTER — Encounter (HOSPITAL_COMMUNITY): Payer: Self-pay | Admitting: Podiatry

## 2014-01-12 LAB — GLUCOSE, CAPILLARY: GLUCOSE-CAPILLARY: 185 mg/dL — AB (ref 70–99)

## 2014-01-30 ENCOUNTER — Ambulatory Visit: Payer: Medicare HMO | Admitting: Neurology

## 2014-03-04 ENCOUNTER — Other Ambulatory Visit: Payer: Self-pay | Admitting: Neurology

## 2014-03-22 ENCOUNTER — Ambulatory Visit (INDEPENDENT_AMBULATORY_CARE_PROVIDER_SITE_OTHER): Payer: Medicare HMO | Admitting: Neurology

## 2014-03-22 ENCOUNTER — Encounter: Payer: Self-pay | Admitting: Neurology

## 2014-03-22 VITALS — BP 150/98 | HR 94 | Resp 14 | Ht 78.0 in | Wt 292.8 lb

## 2014-03-22 DIAGNOSIS — M5481 Occipital neuralgia: Secondary | ICD-10-CM

## 2014-03-22 DIAGNOSIS — G609 Hereditary and idiopathic neuropathy, unspecified: Secondary | ICD-10-CM

## 2014-03-22 MED ORDER — OXCARBAZEPINE ER 600 MG PO TB24: ORAL_TABLET | ORAL | Status: DC

## 2014-03-22 NOTE — Progress Notes (Signed)
[facility]    Provider:  Dr Jaynee Eagles Referring Provider: Monico Blitz, MD Primary Care Physician:  Monico Blitz, MD  CC:  Occipital headaches  HPI:  [name] is a 54 y.o. male here as a rfollow up. The headaches are worse than they were 3 months ago. He has tears it hurts so bad and blurry vision. He takes amitriptyline every night and not helping. The steroids did not help with the pain. He is taking Neurontin at night and it doesn't help. He has shooting pain up the back of the head, lightning. Don't last long, he constantly has a headache aching in the back. Pressure headache. Neuropathy is bad even with the gabapentin.   Initial visit 9/255/2015: [name] is a 54 y.o. male here as a referral from Dr. Cyndi Bender for Headache  Patient reports headache. 2 months. No history of headaches. No family history of headaches. 2 months age he hit his head, "busted head open". Did not lose consciousness. No nausea. No confusion. Injury was at the back of the head. SInce then he is having headaches. They are worse in the middle of the day. Symptoms last day long, never goes away. Has been up 10/10, some nausea with the headache. When it is real bad he can't stand the light. Has tried extra strength migaine excedrin. Not taking it anymore, didn't help. Also tried imitrex didn't help. Amitriptyline 25mg  not helping. He decribes sharp pain in the back of the head. Tender to the touch, feels sore. No vision loss, no weakness, no other paresthesias, no double vision, some neck pain but not associated with the headache. Tenderness to palpation.  Reviewed notes, labs and imaging from outside physicians, which showed: Notes describe similar report as described above with constant dull pain and episodic sharp pain in the back of the head, assocoiated with some light sensitivity and nausea. He was started on Amitriptyline, imitrex and phenergan. Is on neurontin qhs, flexeril prn. Per  notes. CT of the head was unremarkable and MRI of the brain pending. BMP 05/2013 with elevated glucose and reduced GFR.   Patient complains of symptoms per HPI as well as the following symptoms: No CP, No SOB. Pertinent negatives per HPI. All others negative.   History   Social History  . Marital Status: Married    Spouse Name: N/A  . Number of Children: 2  . Years of Education: 10th   Occupational History  . Retired- Research officer, trade union    Social History Main Topics  . Smoking status: Former Smoker -- 3.00 packs/day for 5 years    Types: Cigarettes    Quit date: 07/20/1982  . Smokeless tobacco: Never Used     Comment: quit 1980's  . Alcohol Use: No  . Drug Use: No  . Sexual Activity: Yes    Birth Control/ Protection: None   Other Topics Concern  . Not on file   Social History Narrative    Family History  Problem Relation Age of Onset  . Heart attack Father 44  . Hyperlipidemia Father   . Hypertension Father   . Heart attack Mother 52  . Diabetes Mother   . Hypertension Mother   . Hyperlipidemia Mother   . Healthy Brother   . Seizures Brother   . Migraines Neg Hx     Past Medical History  Diagnosis Date  . Chest pain Sept. 2005    multiple caths  /  cath 06/15/2010..normal coronaries,  EF 60%   (false  postive nuclear in the past)  . IDDM (insulin dependent diabetes mellitus)   . Hypertension   . Dyslipidemia     mixed  . Gastroparesis   . Pulmonary embolus     Hx of small left lower lobe  pulmonary embolus  . Overweight(278.02)   . Peripheral vascular disease   . Headache(784.0)   . Neuropathy associated with endocrine disorder   . Pulmonary embolus 2010ish  . Fibromyalgia   . Cervicalgia   . Urticaria, unspecified   . Allergic rhinitis, cause unspecified   . Lumbago   . 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 Esophageal reflux   . Type II or unspecified type diabetes mellitus with  neurological manifestations, not stated as uncontrolled     Past Surgical History  Procedure Laterality Date  . Back surgery  1999    x3  . Shoulder arthroscopy w/ rotator cuff repair Right   . Achilles tendon repair Left 2013  . Appendectomy    . Lumbar laminectomy/decompression microdiscectomy Right 01/12/2013    Procedure: Right Lumbar Three-Four Laminotomy/Foraminotomy;  Surgeon: Floyce Stakes, MD;  Location: [facility];  Service: Neurosurgery;  Laterality: Right;  Right Lumbar Three-Four Laminotomy/Foraminotomy  . 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 Nissen fundoplication    . Amputation Left 07/21/2013    Procedure: PARTIAL AMPUTATION 2ND TOE LEFT FOOT;  Surgeon: Marcheta Grammes, DPM;  Location: [facility];  Service: Podiatry;  Laterality: Left;  . Foot arthrodesis Right 01/11/2014    Procedure: ARTHRODESIS INTERPHALANGEAL JOINT HALLUX RIGHT FOOT;  Surgeon: Marcheta Grammes, DPM;  Location: [facility];  Service: Podiatry;  Laterality: Right;    Current Outpatient Prescriptions  Medication Sig Dispense Refill  . atorvastatin (LIPITOR) 20 MG tablet Take 20 mg by mouth daily.    . cetirizine (ZYRTEC) 10 MG tablet Take 10 mg by mouth daily.    [name] gabapentin (NEURONTIN) 600 MG tablet Take 2,400 mg by mouth at bedtime.    [name] glimepiride (AMARYL) 2 MG tablet Take 2 mg by mouth daily.      . insulin aspart (NOVOLOG) 100 UNIT/ML injection Inject 10-50 Units into the skin 3 (three) times daily before meals. Per sliding scale.    . insulin detemir (LEVEMIR) 100 UNIT/ML injection Inject 15-50 Units into the skin 2 (two) times daily. Based on sliding scale    . linagliptin (TRADJENTA) 5 MG TABS tablet Take 5 mg by mouth daily.    . metFORMIN (GLUCOPHAGE) 500 MG tablet Take 1,000 mg by mouth 2 (two) times daily with a meal.    . HYDROcodone-acetaminophen  (NORCO/VICODIN) 5-325 MG per tablet Take 1 tablet by mouth daily as needed for moderate pain.     [name] OXcarbazepine ER (OXTELLAR XR) 600 MG TB24 Can increase to 2 pills daily after one week 30 tablet 6   No current facility-administered medications for this visit.    Allergies as of 03/22/2014 - Review Complete 03/22/2014  Allergen Reaction Noted  . Reglan [metoclopramide] Itching and Rash 01/10/2013    Vitals: BP 150/98 mmHg  Pulse 94  Resp 14  Ht 6\' 6"  (1.981 m)  Wt 292 lb 12.8 oz (132.813 kg)  BMI 33.84 kg/m2 Last Weight:  Wt Readings from Last 1 Encounters:  03/22/14 292 lb 12.8 oz (132.813 kg)  Last Height:   Ht Readings from Last 1 Encounters:  03/22/14 6\' 6"  (1.981 m)   Physical exam: Exam: Gen: NAD, conversant, well nourised, obese, well groomed  CV: RRR, no MRG. No Carotid Bruits. No peripheral edema, warm, nontender Eyes: Conjunctivae clear without exudates or hemorrhage Head: Tenderness to palpation in occipital area.  Neuro: Detailed Neurologic Exam  Speech:  Speech is normal; fluent and spontaneous with normal comprehension.  Cognition:  The patient is oriented to person, place, and time;   recent and remote memory intact;   language fluent;   normal attention, concentration, fund of knowledge Cranial Nerves:  The pupils are equal, round, and reactive to light. The fundi are normal and spontaneous venous pulsations are present. Visual fields are full to finger confrontation. Extraocular movements are intact. Trigeminal sensation is intact and the muscles of mastication are normal. The face is symmetric. The palate elevates in the midline. Voice and hearing is normal. Shoulder shrug is normal. The tongue has normal motion without fasciculations.       Assessment/Plan: 54 year old with continuous dull pain in the occipital areas with episodic sharp shooting pains in the same area. Symptoms after trauma to the back of  the head. Can be up to 10/10. Tenderness to palpation on exam. 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 Performed occipital nerve block in the office with headache relief for one week, he declines repeat procedure. Increased Amitriptylline to 50mg  qhs and given a medrol dose pak which did not help.  Carbamazepine is a medication used for occipital neuralgia however it is limited by side effects. Trileptal is similar. Will try Oxtellar XR and provide samples and document with side effects and instructed call the office with any side effects including rash, confusion, illness or anything concerning. May also be beneficial for his neuropathy. Can stop amitriptyline.   Sarina Ill, MD  [facility] [address] [place], [place] [address]  Phone [phone] Fax [phone]  A total of 15 minutes was spent face-to-face with this patient. Over half this time was spent on counseling patient on the occipital headache diagnosis and different diagnostic and therapeutic options available.

## 2014-03-22 NOTE — Patient Instructions (Signed)
Overall you are doing fairly well but I do want to suggest a few things today:   Remember to drink plenty of fluid, eat healthy meals and do not skip any meals. Try to eat protein with a every meal and eat a healthy snack such as fruit or nuts in between meals. Try to keep a regular sleep-wake schedule and try to exercise daily, particularly in the form of walking, 20-30 minutes a day, if you can.   As far as your medications are concerned, I would like to suggest: Oxtellar XR 600mg  daily. After one week can increase to 1200mg  daily (2 pills). Stop for side effects discussed (see below).   I would like to see you back in 3-6 months, sooner if we need to. Please call us with any interim questions, concerns, problems, updates or refill requests.   Please also call us for any test results so we can go over those with you on the phone.  My clinical assistant and will answer any of your questions and relay your messages to me and also relay most of my messages to you.   Our phone number is [phone]. We also have an after hours call service for urgent matters and there is a physician on-call for urgent questions. For any emergencies you know to call 911 or go to the nearest emergency room

## 2014-03-29 ENCOUNTER — Telehealth: Payer: Self-pay | Admitting: Neurology

## 2014-03-29 NOTE — Telephone Encounter (Signed)
[name] - Would you call patient and ask him if he has had an MRI of his brain or neck since starting these symptoms? I don't see that in Dr. Hazle Quant previous notes or in [facility] but maybe he had them done elsewhere. He is refractory to medication and I think we should image him to see if there is any intracranial or cervical cause of his symptoms.    In addition to imaging, which I think is necessary, we could  try Carbamazepine. Will start low, 200mg  twice daily and increase as needed and tolerated. This is the first line medication to his condition but it can have some serious side effects although these are rare. Serious side effects can include low white blood cell count and low platelets, serious skin rash. Tell him to stop taking the medication and call the office if he notices a rash, has chest pain, SOB or has any concernign symptoms. Common side effects can include dizziness and drowsiness. If he is interested I would start low and go slow. Otherwise he has been refractory to multiple medications as well as occipital nerve blocks. We can refer him to a headache clinic for a second opinion.   Thanks, Let me know. Vivien Rota

## 2014-03-29 NOTE — Telephone Encounter (Signed)
Patient stated sample for Rx OXTELLAR XR isn't working for headaches.  Please call and advise.

## 2014-04-03 ENCOUNTER — Other Ambulatory Visit: Payer: Self-pay | Admitting: Neurology

## 2014-04-03 DIAGNOSIS — M5481 Occipital neuralgia: Secondary | ICD-10-CM

## 2014-04-03 MED ORDER — CARBAMAZEPINE ER 200 MG PO TB12: 200.0000 mg | ORAL_TABLET | Freq: Two times a day (BID) | ORAL | Status: DC

## 2014-04-03 NOTE — Telephone Encounter (Signed)
Discussed with patient, see details below. Ordered tegretol, discussed side effects as detailed below. Will also order MRI of the brain and cervical cord due to refractory occipital neuralgia

## 2014-04-03 NOTE — Telephone Encounter (Signed)
Patient checking status of telephone note on 2/24.  Please call and advise.

## 2014-04-04 ENCOUNTER — Other Ambulatory Visit: Payer: Self-pay | Admitting: Neurology

## 2014-04-04 DIAGNOSIS — G8929 Other chronic pain: Secondary | ICD-10-CM

## 2014-04-04 DIAGNOSIS — M5481 Occipital neuralgia: Secondary | ICD-10-CM

## 2014-04-04 DIAGNOSIS — M542 Cervicalgia: Secondary | ICD-10-CM

## 2014-04-24 ENCOUNTER — Ambulatory Visit
Admission: RE | Admit: 2014-04-24 | Discharge: 2014-04-24 | Disposition: A | Discharge status: Home / Self Care | Payer: Medicare HMO | Source: Ambulatory Visit | Attending: Neurology | Admitting: Neurology

## 2014-04-24 DIAGNOSIS — M542 Cervicalgia: Secondary | ICD-10-CM

## 2014-04-24 DIAGNOSIS — M5481 Occipital neuralgia: Secondary | ICD-10-CM | POA: Diagnosis not present

## 2014-04-24 DIAGNOSIS — G8929 Other chronic pain: Secondary | ICD-10-CM

## 2014-04-25 ENCOUNTER — Telehealth: Payer: Self-pay | Admitting: Neurology

## 2014-04-25 NOTE — Telephone Encounter (Signed)
Pt is calling to MRI results.  Please call and advise.

## 2014-04-26 ENCOUNTER — Telehealth: Payer: Self-pay | Admitting: Neurology

## 2014-04-26 NOTE — Telephone Encounter (Signed)
Called patient, left message. Will try to call again tomorrow to discuss results. thanks

## 2014-04-27 NOTE — Telephone Encounter (Signed)
Patient returned call, requesting a call on mobile # [phone].

## 2014-04-28 ENCOUNTER — Other Ambulatory Visit: Payer: Self-pay | Admitting: Neurology

## 2014-04-28 DIAGNOSIS — M5481 Occipital neuralgia: Secondary | ICD-10-CM

## 2014-04-28 MED ORDER — BACLOFEN 10 MG PO TABS: 10.0000 mg | ORAL_TABLET | Freq: Three times a day (TID) | ORAL | Status: DC

## 2014-04-28 MED ORDER — CARBAMAZEPINE ER 200 MG PO TB12: 400.0000 mg | ORAL_TABLET | Freq: Two times a day (BID) | ORAL | Status: DC

## 2014-04-28 NOTE — Telephone Encounter (Signed)
Patient is returning a call regarding MRI results and patient also states he has a really bad headache and does not know what to do. Please call.

## 2014-04-28 NOTE — Telephone Encounter (Signed)
Spoke to patient. He has been refractory to multiple AEDs and muscle relaxers and nerve blocks for his bilateral occipital neuralgia. Will double his carbemazepine to 400mg  bid and add baclofen. He didn't want to continue with nerve blocks but have encouraged him to try them again. Also placed a referral for [facility] for surgical options.  He will come in Tuesday at 10am for nerve blocks. Terrence Dupont, can you put him on the schedule please? Thank you!

## 2014-05-01 NOTE — Telephone Encounter (Signed)
Put pt on schedule for 3/29 at 10am

## 2014-05-02 ENCOUNTER — Ambulatory Visit (INDEPENDENT_AMBULATORY_CARE_PROVIDER_SITE_OTHER): Payer: Medicare HMO | Admitting: Neurology

## 2014-05-02 ENCOUNTER — Encounter: Payer: Self-pay | Admitting: *Deleted

## 2014-05-02 ENCOUNTER — Encounter: Payer: Self-pay | Admitting: Neurology

## 2014-05-02 VITALS — BP 147/88 | HR 76 | Temp 97.5°F | Ht 78.0 in | Wt 290.0 lb

## 2014-05-02 DIAGNOSIS — M5481 Occipital neuralgia: Secondary | ICD-10-CM | POA: Diagnosis not present

## 2014-05-02 NOTE — Progress Notes (Signed)
[facility]    Provider:  Dr Jaynee Eagles Referring Provider: Monico Blitz, MD Primary Care Physician:  Monico Blitz, MD  CC:  Occipital neuralgia  HPI:  [name] is a 54 y.o. male here as a referral from Dr. Manuella Ghazi for occipital neuralgia. He decribes sharp pain in the back of the head. Tender to the touch, feels sore. Started summer of 2014 after trauma where he fell off a ladder and hit his head. He initially reported symptoms of diffuse headache with the occipital neuralgia but now he describes headaches as shooting pain from the neck up to the top of the head, severe, brief, tenderness in the occipital areas bilaterally.  Things are worsening. Shooting pains from the back of the head, severe tenderness in the occipital area. Shooting electric pains every day. Keeping him up at night. Happening daily, brief severe electric shocks. Started after a fall off a ladder and trauma to the back of the head.   Tried amitriptyline, tegretol,trileptal,steroids,baclofen. He is already on Gabapentin high dose.  Occipital nerve block x 1 which helped for a week but patient could not get more since his insurance will not cover. Refractory to all medications. Will increase Tegretol and started baclofen.   NEUROIMAGING REPORT   STUDY DATE: 04/24/2014 PATIENT NAME: [name] DOB: [birth year] MRN: [id number]  EXAM: MRI of the cervical spine  ORDERING CLINICIAN: Sarina Ill CLINICAL HISTORY: 54 year old man COMPARISON FILMS: None  TECHNIQUE: MRI of the cervical spine was obtained utilizing 3 mm sagittal slices from the posterior fossa down to the T3-4 level with T1, T2 and inversion recovery views. In addition 4 mm axial slices from [id number] down to T1-2 level were included with T2 and gradient echo views. CONTRAST: None IMAGING SITE: [facility], [address]  FINDINGS: : On sagittal images, the spine is imaged from above the cervicomedullary junction to T2.  The spinal cord is of normal caliber and signal. The vertebral bodies are normally aligned. The vertebral bodies have normal signal. The discs and interspaces were further evaluated on axial views from C2 to T1 as follows: C2 - C3: There is left greater than right uncovertebral spurring with bilateral disc osteophyte complexes. There is no definite nerve root compression. However, on the left, there is moderate foraminal narrowing that could lead to dynamic impingement of the left C3 nerve root. There is only minimal right foraminal narrowing. C3 - C4: There is left slightly more than right uncovertebral spurring causing mild to moderate foraminal narrowing. This causes moderate left foraminal narrowing that could lead to dynamic impingement of the left C4 nerve root. The neural foramen on the right is widely patent.  C4 - C5: There is mild left paramedian disc protrusion causing mild foraminal narrowing but there does not appear to be nerve root impingement.. C5 - C6: There is a small central disc herniation. This contacts the thecal sac without causing spinal cord compression. The neural foramina are not significantly narrowed and there is no nerve root impingement.. C6 - C7: There is a left paramedian disc herniation and mild uncovertebral spurring and mild facet hypertrophy bilaterally. These combine to cause severe left and more moderate right foraminal narrowing. There is probable left C7 nerve root compression and there could also be some dynamic impingement of the right C7 nerve root with less potential for compression. C7 - T1: The disc and interspace appear normal.   IMPRESSION: This is an abnormal MRI of the cervical spine showing multilevel degenerative changes noted  above. Most significant findings are: 1. Left greater than right uncovertebral spurring with disc bulging at C2-C3 causing moderate left foraminal narrowing that could lead to dynamic impingement of the left C3  nerve root. 2. Left greater than right uncovertebral spurring at C3-C4 causing moderate left foraminal narrowing at could lead to dynamic impingement of the left C4 nerve root. 3. Small central disc herniation at C5-C6 that does not appear to cause nerve root impingement. 4. Left paramedian disc herniation at C6-C7 combining with mild uncovertebral spurring and mild facet hypertrophy severe left and moderate right foraminal narrowing. There is probable left C7 nerve root compression there could also be dynamic impingement of the right C7 nerve root  EXAM: MRI Brain without contrast  ORDERING CLINICIAN: Sarina Ill CLINICAL HISTORY: 54 year old man with several headaches COMPARISON FILMS: CT Head 10/12/2013  TECHNIQUE: MRI of the brain without contrast was obtained utilizing 5 mm axial slices with T1, T2, T2 flair, SWI and diffusion weighted views. T1 sagittal and T2 coronal views were obtained. CONTRAST: none IMAGING SITE:  imaging, [place], [place]  FINDINGS: On sagittal images, the spinal cord is imaged caudally to C3 and is normal in caliber. The contents of the posterior fossa are of normal size and position. The pituitary gland and optic chiasm appear normal. Brain volume appears normal. The ventricles are normal in size and without distortion. There are no abnormal extra-axial collections of fluid.   The cerebellum appears normal. The medulla is slightly displaced to the right due to a tortuous dominant left vertebral artery. The rest of the brainstem appears normal. The deep gray matter appears normal. The cerebral hemispheres appear normal. The orbits appear normal. The VIIth/VIIIth nerve complex appears normal. The mastoid air cells appear normal. The paranasal sinuses appear normal. Flow voids are identified within the major intracerebral arteries. However, the left vertebral artery is dominant and shows a tortuous course, slightly  displacing the medulla (best seen a series 10, imaged 2 and 3). The diameter of the artery is 4.8 mm consistent with mild dolichoectasia.  Diffusion weighted images are normal. Susceptibility weighted images are normal.   When compared to the CT scan of head dated 10/12/2013, there is no interval change. 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 The artery slightly displaces the medulla towards the right . Calcification was noted within this artery on the previous CT scan.  Visit 2/17: [name] is a 54 y.o. male here as a rfollow up. The headaches are worse than they were 3 months ago. He has tears it hurts so bad and blurry vision. He takes amitriptyline every night and not helping. The steroids did not help with the pain. He is taking Neurontin at night and it doesn't help. He has shooting pain up the back of the head, lightning. Don't last long, he constantly has a headache aching in the back. Pressure headache. Neuropathy is bad even with the gabapentin.   Initial visit 9/255/2015: [name] is a 54 y.o. male here as a referral from Dr. Cyndi Bender for Headache  Patient reports headache. 2 months. No history of headaches. No family history of headaches. 2 months age he hit his head, "busted head open". Did not lose consciousness. No nausea. No confusion. Injury was at the back of the head. SInce then he  is having headaches. They are worse in the middle of the day. Symptoms last day long, never goes away. Has been up 10/10, some nausea with the headache. When it is real bad he can't stand the light. Has tried extra strength migaine excedrin. Not taking it anymore, didn't help. Also tried imitrex didn't help. Amitriptyline 25mg  not helping. He decribes sharp pain in the back of the head.  Tender to the touch, feels sore. No vision loss, no weakness, no other paresthesias, no double vision, some neck pain but not associated with the headache. Tenderness to palpation.  Reviewed notes, labs and imaging from outside physicians, which showed: Notes describe similar report as described above with constant dull pain and episodic sharp pain in the back of the head, assocoiated with some light sensitivity and nausea. He was started on Amitriptyline, imitrex and phenergan. Is on neurontin qhs, flexeril prn. Per notes. CT of the head was unremarkable and MRI of the brain pending. BMP 05/2013 with elevated glucose and reduced GFR.    Review of Systems: Patient complains of symptoms per HPI as well as the following symptoms: light sensitivity, memory loss, headache. no chest pain, no SOB. Pertinent negatives per HPI. All others negative.   History   Social History  . Marital Status: Married    Spouse Name: N/A  . Number of Children: 2  . Years of Education: 10th   Occupational History  . Retired- Research officer, trade union    Social History Main Topics  . Smoking status: Former Smoker -- 3.00 packs/day for 5 years    Types: Cigarettes    Quit date: 07/20/1982  . Smokeless tobacco: Never Used     Comment: quit 1980's  . Alcohol Use: No  . Drug Use: No  . Sexual Activity: Yes    Birth Control/ Protection: None   Other Topics Concern  . Not on file   Social History Narrative    Family History  Problem Relation Age of Onset  . Heart attack Father 71  . Hyperlipidemia Father   . Hypertension Father   . Heart attack Mother 86  . Diabetes Mother   . Hypertension Mother   . Hyperlipidemia Mother   . Healthy Brother   . Seizures Brother   . Migraines Neg Hx     Past Medical History  Diagnosis Date  . Chest pain Sept. 2005    multiple caths  /  cath 06/15/2010..normal coronaries,  EF 60%   (false postive nuclear in the past)  . IDDM (insulin dependent diabetes mellitus)   .  Hypertension   . Dyslipidemia     mixed  . Gastroparesis   . Pulmonary embolus     Hx of small left lower lobe  pulmonary embolus  . Overweight(278.02)   . Peripheral vascular disease   . Headache(784.0)   . Neuropathy associated with endocrine disorder   . Pulmonary embolus 2010ish  . Fibromyalgia   . Cervicalgia   . Urticaria, unspecified   . Allergic rhinitis, cause unspecified   . Lumbago   . 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 Esophageal reflux   . Type II or unspecified type diabetes mellitus with neurological manifestations, not stated as uncontrolled     Past Surgical History  Procedure Laterality Date  . Back surgery  1999    x3  . Shoulder arthroscopy w/ rotator cuff repair Right   . Achilles tendon repair Left 2013  .  Appendectomy    . Lumbar laminectomy/decompression microdiscectomy Right 01/12/2013    Procedure: Right Lumbar Three-Four Laminotomy/Foraminotomy;  Surgeon: Floyce Stakes, MD;  Location: [facility];  Service: Neurosurgery;  Laterality: Right;  Right Lumbar Three-Four Laminotomy/Foraminotomy  . 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 Nissen fundoplication    . Amputation Left 07/21/2013    Procedure: PARTIAL AMPUTATION 2ND TOE LEFT FOOT;  Surgeon: Marcheta Grammes, DPM;  Location: [facility];  Service: Podiatry;  Laterality: Left;  . Foot arthrodesis Right 01/11/2014    Procedure: ARTHRODESIS INTERPHALANGEAL JOINT HALLUX RIGHT FOOT;  Surgeon: Marcheta Grammes, DPM;  Location: [facility];  Service: Podiatry;  Laterality: Right;    Current Outpatient Prescriptions  Medication Sig Dispense Refill  . atorvastatin (LIPITOR) 20 MG tablet Take 20 mg by mouth daily.    . baclofen (ED BACLOFEN) 10 MG tablet Take 1 tablet (10 mg total) by mouth 3  (three) times daily. 90 each 0  . carbamazepine (TEGRETOL-XR) 200 MG 12 hr tablet Take 2 tablets (400 mg total) by mouth 2 (two) times daily. 120 tablet 3  . cetirizine (ZYRTEC) 10 MG tablet Take 10 mg by mouth daily.    [name] gabapentin (NEURONTIN) 600 MG tablet Take 2,400 mg by mouth at bedtime.    [name] glimepiride (AMARYL) 2 MG tablet Take 2 mg by mouth daily.      [name] HYDROcodone-acetaminophen (NORCO/VICODIN) 5-325 MG per tablet Take 1 tablet by mouth daily as needed for moderate pain.     [name] insulin aspart (NOVOLOG) 100 UNIT/ML injection Inject 10-50 Units into the skin 3 (three) times daily before meals. Per sliding scale.    . insulin detemir (LEVEMIR) 100 UNIT/ML injection Inject 15-50 Units into the skin 2 (two) times daily. Based on sliding scale    . linagliptin (TRADJENTA) 5 MG TABS tablet Take 5 mg by mouth daily.    . metFORMIN (GLUCOPHAGE) 500 MG tablet Take 1,000 mg by mouth 2 (two) times daily with a meal.     No current facility-administered medications for this visit.    Allergies as of 05/02/2014 - Review Complete 05/02/2014  Allergen Reaction Noted  . Reglan [metoclopramide] Itching and Rash 01/10/2013    Vitals: BP 147/88 mmHg  Pulse 76  Temp(Src) 97.5 F (36.4 C)  Ht 6\' 6"  (1.981 m)  Wt 290 lb (131.543 kg)  BMI 33.52 kg/m2 Last Weight:  Wt Readings from Last 1 Encounters:  05/02/14 290 lb (131.543 kg)   Last Height:   Ht Readings from Last 1 Encounters:  05/02/14 6\' 6"  (1.981 m)   Head: Tenderness to palpation in occipital area.  Neuro: Detailed Neurologic Exam  Speech:  Speech is normal; fluent and spontaneous with normal comprehension.  Cognition:  The patient is oriented to person, place, and time;   recent and remote memory intact;   language fluent;   normal attention, concentration, fund of knowledge Cranial Nerves:  The pupils are equal, round, and reactive to light. The fundi are normal and spontaneous venous pulsations are  present. Visual fields are full to finger confrontation. Extraocular movements are intact. Trigeminal sensation is intact and the muscles of mastication are normal. The face is symmetric. The palate elevates in the midline. Voice and hearing is normal. Shoulder shrug is normal. The tongue has normal motion without fasciculations.      Assessment/Plan:  54 year old with continuous dull pain in the occipital areas with episodic sharp shooting pains in the same area. Symptoms after trauma to the back of the head. Can be up to 10/10. Tenderness to palpation on exam. 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 Performed occipital nerve block in the office with headache relief for one week, he declines repeat procedure.  Tried amitriptyline, tegretol,trileptal,steroids,baclofen. He is already on Gabapentin high dose.  Occipital nerve block x 1 which helped for a week but patient could not get more since his insurance will not cover. Refractory to all medications. Will increase Tegretol and started baclofen.   Referred to [facility] for second opinion and possible surgical options as clinically warranted.     Sarina Ill, MD  [facility] [address] [place], [place] [address]  Phone [phone] Fax [phone]

## 2014-06-22 ENCOUNTER — Telehealth: Payer: Self-pay | Admitting: Neurology

## 2014-06-22 NOTE — Telephone Encounter (Signed)
Pt stated he went to [facility] and Dr. Theda Sers suggested patient had 3 pinched nerves in his neck and this is where his pain was originating from. Pt stated Dr. Theda Sers thought he might need surgery to correct this. Pt still wanting referral to Dr. Joya Salm and stated he will probably need the xrays he had completed. I told him I would let Dr. Jaynee Eagles know and we will call him back regarding referral. Pt verbalized understanding.

## 2014-06-22 NOTE — Telephone Encounter (Signed)
Patient called wanting to know if Dr. Jaynee Eagles can refer him to see Zigmund Daniel. Joya Salm, MD at [facility]. Please call and advise. Patient can be reached [phone]

## 2014-06-26 ENCOUNTER — Telehealth: Payer: Self-pay | Admitting: *Deleted

## 2014-06-26 ENCOUNTER — Other Ambulatory Visit: Payer: Self-pay | Admitting: Neurology

## 2014-06-26 DIAGNOSIS — M5481 Occipital neuralgia: Secondary | ICD-10-CM

## 2014-06-26 DIAGNOSIS — M5090 Cervical disc disorder, unspecified, unspecified cervical region: Secondary | ICD-10-CM

## 2014-06-26 NOTE — Telephone Encounter (Signed)
So he does not need a referral? Or does he want the referral to go to Dr. Joya Salm? Please let Hinton Dyer know, she is doing the referrals this week. Thanks.[name]

## 2014-06-26 NOTE — Telephone Encounter (Signed)
Spoke with pt to let him know Dr. Jaynee Eagles placed referral to neurosurgery for eval of cervical disk disorder/occiptal neuralgia. Pt stated he already spoke with Dr. Joya Salm office and they will call him back to schedule appt once they get a copy of images from [facility]. . I told pt he can also call [facility] to request copies of images on CD's and he can bring those with him to his appt with Dr. Joya Salm. Pt verbalized understanding. I told him to call back if he needs anything else pt verbalized understanding.

## 2014-06-26 NOTE — Telephone Encounter (Signed)
Left message for pt to call us back. Gave [facility] phone number and hours.

## 2014-06-26 NOTE — Telephone Encounter (Signed)
[name] - please let patient know that I placed the referral to neurosurgery for evaluation of cervical disk disorder possibly causing occipital neuralgia. He will have to get a CD with the MRI images and bring that to the appointment with him. Ask him to call the facility he had the MRI completed at at they can provide a CD. Thanks.

## 2014-06-27 NOTE — Telephone Encounter (Signed)
Spoke with [name] and he stated he did not need new referral and he was waiting for a call back from Dr. Harley Hallmark office after they receive images. He stated they will schedule with him after that. He would like referral to go to Dr. Joya Salm. I told him to call back if he had any problems or questions. [name] verbalized understanding.

## 2014-08-21 ENCOUNTER — Telehealth: Payer: Self-pay | Admitting: *Deleted

## 2014-08-21 ENCOUNTER — Ambulatory Visit: Payer: Medicare HMO | Admitting: Neurology

## 2014-08-21 NOTE — Telephone Encounter (Signed)
No showed follow up appointment. 

## 2014-08-22 ENCOUNTER — Encounter: Payer: Self-pay | Admitting: Neurology

## 2014-08-30 ENCOUNTER — Inpatient Hospital Stay (HOSPITAL_COMMUNITY)
Admission: EM | Admit: 2014-08-30 | Discharge: 2014-09-01 | DRG: 637 | Disposition: A | Discharge status: Home / Self Care | Payer: Medicare HMO | Attending: Internal Medicine | Admitting: Internal Medicine

## 2014-08-30 ENCOUNTER — Encounter (HOSPITAL_COMMUNITY): Payer: Self-pay | Admitting: *Deleted

## 2014-08-30 ENCOUNTER — Emergency Department (HOSPITAL_COMMUNITY): Payer: Medicare HMO

## 2014-08-30 DIAGNOSIS — Z86711 Personal history of pulmonary embolism: Secondary | ICD-10-CM | POA: Diagnosis not present

## 2014-08-30 DIAGNOSIS — R079 Chest pain, unspecified: Secondary | ICD-10-CM

## 2014-08-30 DIAGNOSIS — K3184 Gastroparesis: Secondary | ICD-10-CM | POA: Diagnosis present

## 2014-08-30 DIAGNOSIS — E11649 Type 2 diabetes mellitus with hypoglycemia without coma: Principal | ICD-10-CM | POA: Diagnosis present

## 2014-08-30 DIAGNOSIS — N179 Acute kidney failure, unspecified: Secondary | ICD-10-CM | POA: Diagnosis present

## 2014-08-30 DIAGNOSIS — G43709 Chronic migraine without aura, not intractable, without status migrainosus: Secondary | ICD-10-CM | POA: Diagnosis present

## 2014-08-30 DIAGNOSIS — E11628 Type 2 diabetes mellitus with other skin complications: Secondary | ICD-10-CM | POA: Diagnosis present

## 2014-08-30 DIAGNOSIS — E1143 Type 2 diabetes mellitus with diabetic autonomic (poly)neuropathy: Secondary | ICD-10-CM | POA: Diagnosis present

## 2014-08-30 DIAGNOSIS — G459 Transient cerebral ischemic attack, unspecified: Secondary | ICD-10-CM | POA: Diagnosis present

## 2014-08-30 DIAGNOSIS — Z87891 Personal history of nicotine dependence: Secondary | ICD-10-CM | POA: Diagnosis not present

## 2014-08-30 DIAGNOSIS — E785 Hyperlipidemia, unspecified: Secondary | ICD-10-CM | POA: Diagnosis present

## 2014-08-30 DIAGNOSIS — X58XXXS Exposure to other specified factors, sequela: Secondary | ICD-10-CM | POA: Diagnosis present

## 2014-08-30 DIAGNOSIS — S0990XS Unspecified injury of head, sequela: Secondary | ICD-10-CM

## 2014-08-30 DIAGNOSIS — G44309 Post-traumatic headache, unspecified, not intractable: Secondary | ICD-10-CM | POA: Diagnosis present

## 2014-08-30 DIAGNOSIS — E1142 Type 2 diabetes mellitus with diabetic polyneuropathy: Secondary | ICD-10-CM | POA: Diagnosis present

## 2014-08-30 DIAGNOSIS — M797 Fibromyalgia: Secondary | ICD-10-CM | POA: Diagnosis present

## 2014-08-30 DIAGNOSIS — K219 Gastro-esophageal reflux disease without esophagitis: Secondary | ICD-10-CM | POA: Diagnosis present

## 2014-08-30 DIAGNOSIS — Z8249 Family history of ischemic heart disease and other diseases of the circulatory system: Secondary | ICD-10-CM

## 2014-08-30 DIAGNOSIS — G9341 Metabolic encephalopathy: Secondary | ICD-10-CM | POA: Diagnosis present

## 2014-08-30 DIAGNOSIS — Z6832 Body mass index (BMI) 32.0-32.9, adult: Secondary | ICD-10-CM

## 2014-08-30 DIAGNOSIS — I6789 Other cerebrovascular disease: Secondary | ICD-10-CM | POA: Diagnosis not present

## 2014-08-30 DIAGNOSIS — I10 Essential (primary) hypertension: Secondary | ICD-10-CM | POA: Diagnosis present

## 2014-08-30 DIAGNOSIS — Z7982 Long term (current) use of aspirin: Secondary | ICD-10-CM

## 2014-08-30 DIAGNOSIS — G629 Polyneuropathy, unspecified: Secondary | ICD-10-CM

## 2014-08-30 DIAGNOSIS — L988 Other specified disorders of the skin and subcutaneous tissue: Secondary | ICD-10-CM | POA: Diagnosis not present

## 2014-08-30 DIAGNOSIS — E663 Overweight: Secondary | ICD-10-CM | POA: Diagnosis present

## 2014-08-30 DIAGNOSIS — Z833 Family history of diabetes mellitus: Secondary | ICD-10-CM | POA: Diagnosis not present

## 2014-08-30 DIAGNOSIS — Z89412 Acquired absence of left great toe: Secondary | ICD-10-CM

## 2014-08-30 DIAGNOSIS — Z794 Long term (current) use of insulin: Secondary | ICD-10-CM | POA: Diagnosis not present

## 2014-08-30 DIAGNOSIS — I639 Cerebral infarction, unspecified: Secondary | ICD-10-CM | POA: Diagnosis present

## 2014-08-30 DIAGNOSIS — E860 Dehydration: Secondary | ICD-10-CM | POA: Diagnosis present

## 2014-08-30 DIAGNOSIS — G9340 Encephalopathy, unspecified: Secondary | ICD-10-CM | POA: Diagnosis present

## 2014-08-30 DIAGNOSIS — R410 Disorientation, unspecified: Secondary | ICD-10-CM | POA: Diagnosis not present

## 2014-08-30 DIAGNOSIS — E782 Mixed hyperlipidemia: Secondary | ICD-10-CM | POA: Diagnosis present

## 2014-08-30 LAB — COMPREHENSIVE METABOLIC PANEL
ALBUMIN: 4.2 g/dL (ref 3.5–5.0)
ALT: 28 U/L (ref 17–63)
AST: 32 U/L (ref 15–41)
Alkaline Phosphatase: 99 U/L (ref 38–126)
Anion gap: 11 (ref 5–15)
BUN: 19 mg/dL (ref 6–20)
CO2: 23 mmol/L (ref 22–32)
Calcium: 9.3 mg/dL (ref 8.9–10.3)
Chloride: 103 mmol/L (ref 101–111)
Creatinine, Ser: 1.32 mg/dL — ABNORMAL HIGH (ref 0.61–1.24)
GFR calc Af Amer: >60 mL/min (ref >60)
GFR calc non Af Amer: 59 mL/min — ABNORMAL LOW (ref >60)
Glucose, Bld: 185 mg/dL — ABNORMAL HIGH (ref 65–99)
Potassium: 4 mmol/L (ref 3.5–5.1)
Sodium: 137 mmol/L (ref 135–145)
TOTAL PROTEIN: 7.5 g/dL (ref 6.5–8.1)
Total Bilirubin: 0.4 mg/dL (ref 0.3–1.2)

## 2014-08-30 LAB — URINALYSIS, ROUTINE W REFLEX MICROSCOPIC
BILIRUBIN URINE: NEGATIVE
Glucose, UA: >1000 mg/dL — AB
Hgb urine dipstick: NEGATIVE
KETONES UR: NEGATIVE mg/dL
Leukocytes, UA: NEGATIVE
Nitrite: NEGATIVE
PROTEIN: NEGATIVE mg/dL
SPECIFIC GRAVITY, URINE: 1.025 (ref 1.005–1.030)
UROBILINOGEN UA: 0.2 mg/dL (ref 0.0–1.0)
pH: 5.5 (ref 5.0–8.0)

## 2014-08-30 LAB — CBC
HEMATOCRIT: 41.7 % (ref 39.0–52.0)
Hemoglobin: 14.1 g/dL (ref 13.0–17.0)
MCH: 26.5 pg (ref 26.0–34.0)
MCHC: 33.8 g/dL (ref 30.0–36.0)
MCV: 78.2 fL (ref 78.0–100.0)
Platelets: 171 10*3/uL (ref 150–400)
RBC: 5.33 MIL/uL (ref 4.22–5.81)
RDW: 15.2 % (ref 11.5–15.5)
WBC: 7.9 10*3/uL (ref 4.0–10.5)

## 2014-08-30 LAB — D-DIMER, QUANTITATIVE: D-Dimer, Quant: 0.76 ug/mL-FEU — ABNORMAL HIGH (ref 0.00–0.48)

## 2014-08-30 LAB — URINE MICROSCOPIC-ADD ON

## 2014-08-30 LAB — GLUCOSE, CAPILLARY
GLUCOSE-CAPILLARY: 138 mg/dL — AB (ref 65–99)
Glucose-Capillary: 132 mg/dL — ABNORMAL HIGH (ref 65–99)

## 2014-08-30 LAB — I-STAT TROPONIN, ED: Troponin i, poc: 0 ng/mL (ref 0.00–0.08)

## 2014-08-30 LAB — TROPONIN I
Troponin I: <0.03 ng/mL (ref <0.031)
Troponin I: <0.03 ng/mL (ref <0.031)

## 2014-08-30 MED ORDER — OXYCODONE HCL 5 MG PO TABS: 5.0000 mg | ORAL_TABLET | ORAL | Status: DC | PRN

## 2014-08-30 MED ORDER — INSULIN ASPART 100 UNIT/ML ~~LOC~~ SOLN: 0.0000 [IU] | Freq: Every day | SUBCUTANEOUS | Status: DC

## 2014-08-30 MED ORDER — STROKE: EARLY STAGES OF RECOVERY BOOK
Freq: Once | Status: AC
  Administered 2014-08-30: 22:00:00
  Filled 2014-08-30: qty 1

## 2014-08-30 MED ORDER — ONDANSETRON HCL 4 MG PO TABS: 4.0000 mg | ORAL_TABLET | Freq: Four times a day (QID) | ORAL | Status: DC | PRN

## 2014-08-30 MED ORDER — ONDANSETRON HCL 4 MG/2ML IJ SOLN: 4.0000 mg | Freq: Four times a day (QID) | INTRAMUSCULAR | Status: DC | PRN

## 2014-08-30 MED ORDER — HEPARIN SODIUM (PORCINE) 5000 UNIT/ML IJ SOLN
5000.0000 [IU] | Freq: Three times a day (TID) | INTRAMUSCULAR | Status: DC
  Administered 2014-08-30 – 2014-09-01 (×6): 5000 [IU] via SUBCUTANEOUS
  Filled 2014-08-30 (×6): qty 1

## 2014-08-30 MED ORDER — GI COCKTAIL ~~LOC~~: 30.0000 mL | Freq: Three times a day (TID) | ORAL | Status: DC | PRN

## 2014-08-30 MED ORDER — GABAPENTIN 400 MG PO CAPS
2400.0000 mg | ORAL_CAPSULE | Freq: Every day | ORAL | Status: DC
  Administered 2014-08-31: 2400 mg via ORAL
  Filled 2014-08-30 (×3): qty 6

## 2014-08-30 MED ORDER — INSULIN ASPART 100 UNIT/ML ~~LOC~~ SOLN
0.0000 [IU] | Freq: Three times a day (TID) | SUBCUTANEOUS | Status: DC
  Administered 2014-08-31: 4 [IU] via SUBCUTANEOUS
  Administered 2014-08-31: 11 [IU] via SUBCUTANEOUS
  Administered 2014-09-01 (×2): 7 [IU] via SUBCUTANEOUS
  Administered 2014-09-01: 3 [IU] via SUBCUTANEOUS

## 2014-08-30 MED ORDER — ATORVASTATIN CALCIUM 20 MG PO TABS
20.0000 mg | ORAL_TABLET | Freq: Every day | ORAL | Status: DC
  Administered 2014-08-31 – 2014-09-01 (×2): 20 mg via ORAL
  Filled 2014-08-30 (×2): qty 1

## 2014-08-30 MED ORDER — ACETAMINOPHEN 650 MG RE SUPP: 650.0000 mg | Freq: Four times a day (QID) | RECTAL | Status: DC | PRN

## 2014-08-30 MED ORDER — ACETAMINOPHEN 325 MG PO TABS: 650.0000 mg | ORAL_TABLET | Freq: Four times a day (QID) | ORAL | Status: DC | PRN

## 2014-08-30 MED ORDER — MORPHINE SULFATE 4 MG/ML IJ SOLN
4.0000 mg | Freq: Once | INTRAMUSCULAR | Status: AC
  Administered 2014-08-30: 4 mg via INTRAVENOUS
  Filled 2014-08-30: qty 1

## 2014-08-30 MED ORDER — ASPIRIN EC 81 MG PO TBEC
81.0000 mg | DELAYED_RELEASE_TABLET | Freq: Every day | ORAL | Status: DC
  Administered 2014-08-31 – 2014-09-01 (×2): 81 mg via ORAL
  Filled 2014-08-30 (×2): qty 1

## 2014-08-30 MED ORDER — PANTOPRAZOLE SODIUM 40 MG IV SOLR
40.0000 mg | Freq: Once | INTRAVENOUS | Status: AC
  Administered 2014-08-30: 40 mg via INTRAVENOUS
  Filled 2014-08-30: qty 40

## 2014-08-30 MED ORDER — SODIUM CHLORIDE 0.9 % IV SOLN
INTRAVENOUS | Status: DC
  Administered 2014-08-30 – 2014-09-01 (×3): via INTRAVENOUS

## 2014-08-30 NOTE — ED Notes (Signed)
Pt states constant CP since Monday. Pt also states slurred speech Monday and yesterday. Wife states he was also disoriented yesterday. Pain to center and left chest at present, described as "dull pressure".

## 2014-08-30 NOTE — ED Provider Notes (Signed)
CSN: [id number]     Arrival date & time 08/30/14  1359 History   First MD Initiated Contact with Patient 08/30/14 1406     Chief Complaint  Patient presents with  . Chest Pain     (Consider location/radiation/quality/duration/timing/severity/associated sxs/prior Treatment) Patient is a 54 y.o. male presenting with chest pain. The history is provided by the patient (pt states he has had chest pain for one day.  also he has had slurred speech twice in the last two days).  Chest Pain Pain location:  Substernal area Pain quality: aching   Pain radiates to:  Does not radiate Pain radiates to the back: no   Pain severity:  Mild Onset quality:  Gradual Timing:  Intermittent Progression:  Waxing and waning Chronicity:  New Associated symptoms: no abdominal pain, no back pain, no cough, no fatigue and no headache     Past Medical History  Diagnosis Date  . Chest pain Sept. 2005    multiple caths  /  cath 06/15/2010..normal coronaries,  EF 60%   (false postive nuclear in the past)  . IDDM (insulin dependent diabetes mellitus)   . Hypertension   . Dyslipidemia     mixed  . Gastroparesis   . Pulmonary embolus     Hx of small left lower lobe  pulmonary embolus  . Overweight(278.02)   . Peripheral vascular disease   . Headache(784.0)   . Neuropathy associated with endocrine disorder   . Pulmonary embolus 2010ish  . Fibromyalgia   . Cervicalgia   . Urticaria, unspecified   . Allergic rhinitis, cause unspecified   . Lumbago   . 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 Esophageal reflux   . Type II or unspecified type diabetes mellitus with neurological manifestations, not stated as uncontrolled    Past Surgical History  Procedure Laterality Date  . Back surgery  1999    x3  . Shoulder arthroscopy w/ rotator cuff repair Right   . Achilles tendon repair Left 2013  . Appendectomy    . Lumbar  laminectomy/decompression microdiscectomy Right 01/12/2013    Procedure: Right Lumbar Three-Four Laminotomy/Foraminotomy;  Surgeon: Floyce Stakes, MD;  Location: [facility];  Service: Neurosurgery;  Laterality: Right;  Right Lumbar Three-Four Laminotomy/Foraminotomy  . 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 Nissen fundoplication    . Amputation Left 07/21/2013    Procedure: PARTIAL AMPUTATION 2ND TOE LEFT FOOT;  Surgeon: Marcheta Grammes, DPM;  Location: [facility];  Service: Podiatry;  Laterality: Left;  . Foot arthrodesis Right 01/11/2014    Procedure: ARTHRODESIS INTERPHALANGEAL JOINT HALLUX RIGHT FOOT;  Surgeon: Marcheta Grammes, DPM;  Location: [facility];  Service: Podiatry;  Laterality: Right;   Family History  Problem Relation Age of Onset  . Heart attack Father 38  . Hyperlipidemia Father   . Hypertension Father   . Heart attack Mother 58  . Diabetes Mother   . Hypertension Mother   . Hyperlipidemia Mother   . Healthy Brother   . Seizures Brother   . Migraines Neg Hx    History  Substance Use Topics  . Smoking status: Former Smoker -- 3.00 packs/day for 5 years    Types: Cigarettes    Quit date: 07/20/1982  . Smokeless tobacco: Never Used     Comment: quit  1980's  . Alcohol Use: No    Review of Systems  Constitutional: Negative for appetite change and fatigue.  HENT: Negative for congestion, ear discharge and sinus pressure.   Eyes: Negative for discharge.  Respiratory: Negative for cough.   Cardiovascular: Positive for chest pain.  Gastrointestinal: Negative for abdominal pain and diarrhea.  Genitourinary: Negative for frequency and hematuria.  Musculoskeletal: Negative for back pain.  Skin: Negative for rash.  Neurological: Negative for seizures and headaches.       Slurred speech  Psychiatric/Behavioral: Negative for  hallucinations.      Allergies  Reglan  Home Medications   Prior to Admission medications   Medication Sig Start Date End Date Taking? Authorizing Provider  aspirin EC 81 MG tablet Take 81 mg by mouth daily.   Yes Historical Provider, MD  atorvastatin (LIPITOR) 20 MG tablet Take 20 mg by mouth daily.   Yes Historical Provider, MD  gabapentin (NEURONTIN) 600 MG tablet Take 2,400 mg by mouth at bedtime.   Yes Historical Provider, MD  glimepiride (AMARYL) 2 MG tablet Take 2 mg by mouth 2 (two) times daily.    Yes Historical Provider, MD  insulin aspart (NOVOLOG) 100 UNIT/ML injection Inject 10-50 Units into the skin 3 (three) times daily before meals. Per sliding scale.   Yes Historical Provider, MD  insulin detemir (LEVEMIR) 100 UNIT/ML injection Inject 15-50 Units into the skin 3 (three) times daily. Based on sliding scale   Yes Historical Provider, MD  lidocaine (XYLOCAINE) 5 % ointment Apply 1 application topically daily as needed for mild pain or moderate pain.  08/23/14  Yes Historical Provider, MD  linagliptin (TRADJENTA) 5 MG TABS tablet Take 5 mg by mouth daily.   Yes Historical Provider, MD  metFORMIN (GLUCOPHAGE) 500 MG tablet Take 1,000 mg by mouth 2 (two) times daily with a meal.   Yes Historical Provider, MD  baclofen (ED BACLOFEN) 10 MG tablet Take 1 tablet (10 mg total) by mouth 3 (three) times daily. Patient not taking: Reported on 08/30/2014 04/28/14   Melvenia Beam, MD  carbamazepine (TEGRETOL-XR) 200 MG 12 hr tablet Take 2 tablets (400 mg total) by mouth 2 (two) times daily. Patient not taking: Reported on 08/30/2014 04/28/14   Melvenia Beam, MD   BP 163/80 mmHg  Pulse 73  Temp(Src) 97.8 F (36.6 C) (Oral)  Resp 16  Ht 6\' 6"  (1.981 m)  Wt 278 lb (126.1 kg)  BMI 32.13 kg/m2  SpO2 100% Physical Exam  Constitutional: He is oriented to person, place, and time. He appears well-developed.  HENT:  Head: Normocephalic.  Eyes: Conjunctivae and EOM are normal. No scleral  icterus.  Neck: Neck supple. No thyromegaly present.  Cardiovascular: Normal rate and regular rhythm.  Exam reveals no gallop and no friction rub.   No murmur heard. Pulmonary/Chest: No stridor. He has no wheezes. He has no rales. He exhibits no tenderness.  Abdominal: He exhibits no distension. There is no tenderness. There is no rebound.  Musculoskeletal: Normal range of motion. He exhibits no edema.  Lymphadenopathy:    He has no cervical adenopathy.  Neurological: He is oriented to person, place, and time. He exhibits normal muscle tone. Coordination normal.  Skin: No rash noted. No erythema.  Psychiatric: He has a normal mood and affect. His behavior is normal.    ED Course  Procedures (including critical care time) Labs Review Labs Reviewed  COMPREHENSIVE METABOLIC PANEL - Abnormal; Notable for the following:    Glucose,  Bld 185 (*)    Creatinine, Ser 1.32 (*)    GFR calc non Af Amer 59 (*)    All other components within normal limits  CBC  URINALYSIS, ROUTINE W REFLEX MICROSCOPIC (NOT AT [facility])  Randolm Idol, ED    Imaging Review Dg Chest 2 View  08/30/2014   CLINICAL DATA:  Chest pain for 3 days  EXAM: CHEST - 2 VIEW  COMPARISON:  01/12/2013  FINDINGS: The heart size and mediastinal contours are within normal limits. Both lungs are clear. The visualized skeletal structures are unremarkable.  IMPRESSION: No active disease.   Electronically Signed   By: Inez Catalina M.D.   On: 08/30/2014 15:48   Mr Brain Wo Contrast  08/30/2014   CLINICAL DATA:  Slurred speech.  Weakness.  Headache for 2 days.  EXAM: MRI HEAD WITHOUT CONTRAST  TECHNIQUE: Multiplanar, multiecho pulse sequences of the brain and surrounding structures were obtained without intravenous contrast.  COMPARISON:  None.  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  No acute infarct, hemorrhage, or mass lesion is present. There is no significant white matter disease. Ventricles are of normal  size. No significant extraaxial fluid collection is present.  Flow is present in the major intracranial arteries. The globes and orbits are intact. The paranasal sinuses are clear. There is some fluid in the mastoid air cells bilaterally. No obstructing nasopharyngeal lesion is present.  The skullbase is within normal limits. Midline structures demonstrate mild degenerative change at C3-4. Intracranial midline structures are unremarkable.  IMPRESSION: 1. Normal MRI appearance of the brain for age. 2. 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 3. Degenerative changes in the upper cervical spine.   Electronically Signed   By: San Morelle M.D.   On: 08/30/2014 15:13     EKG Interpretation   Date/Time:  Wednesday August 30 2014 14:04:17 EDT Ventricular Rate:  75 PR Interval:  195 QRS Duration: 76 QT Interval:  349 QTC Calculation: 390 R Axis:   30 Text Interpretation:  Sinus rhythm Abnormal R-wave progression, early  transition Confirmed by Aneisa Karren  MD, [place] [phone]) on 08/30/2014 3:25:47 PM  Also confirmed by Ravinder Lukehart  MD, [place]us John [phone])  on 08/30/2014 3:50:53 PM      MDM   Final diagnoses:  None    Chest pain and slurred speech.  Will admit and rule out cad and work up for Harmon Dun, MD 08/30/14 1557

## 2014-08-30 NOTE — H&P (Signed)
[facility] History and Physical  [name] [id number] DOB: 12-02-1960 DOA: 08/30/2014  Referring physician: Dr Roderic Palau - [facility] PCP: Monico Blitz, MD   Chief Complaint: CP slurred speech  HPI: [name] is a 54 y.o. male  2 days of CP and slurred speech and disorientation. Symptoms are constant and unchanged since onset. Nothing makes them better or worse. Tried taking time off work and relaxing the last couple of days w/o benefit. CP is substernal w/ radiation to L chest wall. Single episode of not knowing where to go while driving w/ wife. Symptoms came on acutely on Monday when his glucose dropped to 40. No recent changes in DM regimen.   Never had an episode like this before.   Review of Systems:  Constitutional:  No weight loss, night sweats, Fevers, chills, fatigue.  HEENT:  No headaches, Difficulty swallowing,Tooth/dental problems,Sore throat,  No sneezing, itching, ear ache, nasal congestion, post nasal drip,  Cardio-vascular: Per HPI GI:  No heartburn, indigestion, abdominal pain, nausea, vomiting, diarrhea, change in bowel habits, loss of appetite  Resp:   No shortness of breath with exertion or at rest. No excess mucus, no productive cough, No non-productive cough, No coughing up of blood.No change in color of mucus.No wheezing.No chest wall deformity  Skin:  no rash or lesions.  GU:  no dysuria, change in color of urine, no urgency or frequency. No flank pain.  Musculoskeletal:   No joint pain or swelling. No decreased range of motion. No back pain.  Psych:  No change in mood or affect. No depression or anxiety. No memory loss.   Past Medical History  Diagnosis Date  . Chest pain Sept. 2005    multiple caths  /  cath 06/15/2010..normal coronaries,  EF 60%   (false postive nuclear in the past)  . IDDM (insulin dependent diabetes mellitus)   . Hypertension   . Dyslipidemia     mixed  . Gastroparesis   . Pulmonary embolus     Hx of small left  lower lobe  pulmonary embolus  . Overweight(278.02)   . Peripheral vascular disease   . Headache(784.0)   . Neuropathy associated with endocrine disorder   . Pulmonary embolus 2010ish  . Fibromyalgia   . Cervicalgia   . Urticaria, unspecified   . Allergic rhinitis, cause unspecified   . Lumbago   . 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 Esophageal reflux   . Type II or unspecified type diabetes mellitus with neurological manifestations, not stated as uncontrolled    Past Surgical History  Procedure Laterality Date  . Back surgery  1999    x3  . Shoulder arthroscopy w/ rotator cuff repair Right   . Achilles tendon repair Left 2013  . Appendectomy    . Lumbar laminectomy/decompression microdiscectomy Right 01/12/2013    Procedure: Right Lumbar Three-Four Laminotomy/Foraminotomy;  Surgeon: Floyce Stakes, MD;  Location: [facility];  Service: Neurosurgery;  Laterality: Right;  Right Lumbar Three-Four Laminotomy/Foraminotomy  . 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 Nissen fundoplication    . Amputation Left 07/21/2013    Procedure: PARTIAL AMPUTATION 2ND TOE LEFT FOOT;  Surgeon: Marcheta Grammes, DPM;  Location: [facility];  Service: Podiatry;  Laterality: Left;  . Foot arthrodesis Right 01/11/2014    Procedure: ARTHRODESIS  INTERPHALANGEAL JOINT HALLUX RIGHT FOOT;  Surgeon: Marcheta Grammes, DPM;  Location: [facility];  Service: Podiatry;  Laterality: Right;   Social History:  reports that he quit smoking about 32 years ago. His smoking use included Cigarettes. He has a 15 pack-year smoking history. He has never used smokeless tobacco. He reports that he does not drink alcohol or use illicit drugs.  Allergies  Allergen Reactions  . Reglan [Metoclopramide] Itching and Rash     Family History  Problem Relation Age of Onset  . Heart attack Father 56  . Hyperlipidemia Father   . Hypertension Father   . Heart attack Mother 37  . Diabetes Mother   . Hypertension Mother   . Hyperlipidemia Mother   . Healthy Brother   . Seizures Brother   . Migraines Neg Hx      Prior to Admission medications   Medication Sig Start Date End Date Taking? Authorizing Provider  aspirin EC 81 MG tablet Take 81 mg by mouth daily.   Yes Historical Provider, MD  atorvastatin (LIPITOR) 20 MG tablet Take 20 mg by mouth daily.   Yes Historical Provider, MD  gabapentin (NEURONTIN) 600 MG tablet Take 2,400 mg by mouth at bedtime.   Yes Historical Provider, MD  glimepiride (AMARYL) 2 MG tablet Take 2 mg by mouth 2 (two) times daily.    Yes Historical Provider, MD  insulin aspart (NOVOLOG) 100 UNIT/ML injection Inject 10-50 Units into the skin 3 (three) times daily before meals. Per sliding scale.   Yes Historical Provider, MD  insulin detemir (LEVEMIR) 100 UNIT/ML injection Inject 15-50 Units into the skin 3 (three) times daily. Based on sliding scale   Yes Historical Provider, MD  lidocaine (XYLOCAINE) 5 % ointment Apply 1 application topically daily as needed for mild pain or moderate pain.  08/23/14  Yes Historical Provider, MD  linagliptin (TRADJENTA) 5 MG TABS tablet Take 5 mg by mouth daily.   Yes Historical Provider, MD  metFORMIN (GLUCOPHAGE) 500 MG tablet Take 1,000 mg by mouth 2 (two) times daily with a meal.   Yes Historical Provider, MD  baclofen (ED BACLOFEN) 10 MG tablet Take 1 tablet (10 mg total) by mouth 3 (three) times daily. Patient not taking: Reported on 08/30/2014 04/28/14   Melvenia Beam, MD  carbamazepine (TEGRETOL-XR) 200 MG 12 hr tablet Take 2 tablets (400 mg total) by mouth 2 (two) times daily. Patient not taking: Reported on 08/30/2014 04/28/14   Melvenia Beam, MD   Physical Exam: Filed Vitals:   08/30/14 1408 08/30/14 1556 08/30/14 1649  BP: 163/80 138/66  141/89  Pulse: 73 68 69  Temp: 97.8 F (36.6 C) 97.8 F (36.6 C) 97.8 F (36.6 C)  TempSrc: Oral Oral Oral  Resp: 16 18 18   Height: 6\' 6"  (1.981 m)    Weight: 126.1 kg (278 lb)    SpO2: 100% 99% 99%    Wt Readings from Last 3 Encounters:  08/30/14 126.1 kg (278 lb)  05/02/14 131.543 kg (290 lb)  03/22/14 132.813 kg (292 lb 12.8 oz)    General:  Appears calm and comfortable Eyes:  PERRL, normal lids, irises & conjunctiva ENT:  grossly normal hearing, lips & tongue Neck:  no LAD, masses or thyromegaly Cardiovascular:  RRR, no m/r/g. No LE edema. Telemetry:  SR, no arrhythmias  Respiratory:  CTA bilaterally, no w/r/r. Normal respiratory effort. Abdomen:  soft, ntnd Skin:  no rash or induration seen on limited exam Musculoskeletal:  grossly normal tone  BUE/BLE Psychiatric:  grossly normal mood and affect, speech fluent and appropriate Neurologic: CN 2-12 intact, no dymetria, speech nml., moves all extremities in coordinated fashion           Labs on Admission:  Basic Metabolic Panel:  Recent Labs Lab 08/30/14 1417  NA 137  K 4.0  CL 103  CO2 23  GLUCOSE 185*  BUN 19  CREATININE 1.32*  CALCIUM 9.3   Liver Function Tests:  Recent Labs Lab 08/30/14 1417  AST 32  ALT 28  ALKPHOS 99  BILITOT 0.4  PROT 7.5  ALBUMIN 4.2   No results for input(s): LIPASE, AMYLASE in the last 168 hours. No results for input(s): AMMONIA in the last 168 hours. CBC:  Recent Labs Lab 08/30/14 1417  WBC 7.9  HGB 14.1  HCT 41.7  MCV 78.2  PLT 171   Cardiac Enzymes: No results for input(s): CKTOTAL, CKMB, CKMBINDEX, TROPONINI in the last 168 hours.  BNP (last 3 results) No results for input(s): BNP in the last 8760 hours.  ProBNP (last 3 results) No results for input(s): PROBNP in the last 8760 hours.  CBG: No results for input(s): GLUCAP in the last 168 hours.  Radiological Exams on Admission: Dg Chest 2 View  08/30/2014   CLINICAL DATA:  Chest pain for 3 days   EXAM: CHEST - 2 VIEW  COMPARISON:  01/12/2013  FINDINGS: The heart size and mediastinal contours are within normal limits. Both lungs are clear. The visualized skeletal structures are unremarkable.  IMPRESSION: No active disease.   Electronically Signed   By: Inez Catalina M.D.   On: 08/30/2014 15:48   Mr Brain Wo Contrast  08/30/2014   CLINICAL DATA:  Slurred speech.  Weakness.  Headache for 2 days.  EXAM: MRI HEAD WITHOUT CONTRAST  TECHNIQUE: Multiplanar, multiecho pulse sequences of the brain and surrounding structures were obtained without intravenous contrast.  COMPARISON:  None.  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  No acute infarct, hemorrhage, or mass lesion is present. There is no significant white matter disease. Ventricles are of normal size. No significant extraaxial fluid collection is present.  Flow is present in the major intracranial arteries. The globes and orbits are intact. The paranasal sinuses are clear. There is some fluid in the mastoid air cells bilaterally. No obstructing nasopharyngeal lesion is present.  The skullbase is within normal limits. Midline structures demonstrate mild degenerative change at C3-4. Intracranial midline structures are unremarkable.  IMPRESSION: 1. Normal MRI appearance of the brain for age. 2. 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 3. Degenerative changes in the upper cervical spine.   Electronically Signed   By: San Morelle M.D.   On: 08/30/2014 15:13    Assessment/Plan Principal Problem:   TIA (transient ischemic attack) Active Problems:   Chest pain   Diabetes with skin complication   Neuropathy   AKI (acute kidney injury)   HLD (hyperlipidemia)   Slurred speech and disorientation: No appreciable deficits on exam. MRI nml accept for "stable dolichoectasia of L vertebral artery causing mass effect on brainstem. TIA vs hypoglecemic  event vs mass effect on brain stem vs migraine/aura. Pt apparently aware of abnormality and has been a concern for possible etiology of his chronic migraines. Dr. Merlene Laughter consulted and will see pt in am.  - Tele - CTA head neck - Echo - PT/OT - neuro checks  CP: anxiety vs  cardiac. H/o PE. Tropn neg. EKG nml - tele - cycle cardiac enzymes - EKG in am - DDIMer - GI cocktail  AKI: Cr 1.32, baseline 1.1. Likely from dehydration from poor oral intake over past several days - BMET in am  HLD:  - continue statin  DM neuropathy - continue neurontin  DM:  - SSI  -   Code Status: FULL DVT Prophylaxis: Hep Family Communication: Wife Disposition Plan: Pending workup   MERRELL, DAVID J, MD Family Medicine [facility] [email or web address] Password [email or web address]

## 2014-08-31 ENCOUNTER — Inpatient Hospital Stay (HOSPITAL_BASED_OUTPATIENT_CLINIC_OR_DEPARTMENT_OTHER): Payer: Medicare HMO

## 2014-08-31 ENCOUNTER — Inpatient Hospital Stay (HOSPITAL_COMMUNITY): Payer: Medicare HMO

## 2014-08-31 DIAGNOSIS — N179 Acute kidney failure, unspecified: Secondary | ICD-10-CM

## 2014-08-31 DIAGNOSIS — G459 Transient cerebral ischemic attack, unspecified: Secondary | ICD-10-CM

## 2014-08-31 DIAGNOSIS — I6789 Other cerebrovascular disease: Secondary | ICD-10-CM

## 2014-08-31 LAB — BASIC METABOLIC PANEL
Anion gap: 7 (ref 5–15)
BUN: 17 mg/dL (ref 6–20)
CHLORIDE: 108 mmol/L (ref 101–111)
CO2: 25 mmol/L (ref 22–32)
CREATININE: 1.09 mg/dL (ref 0.61–1.24)
Calcium: 8.9 mg/dL (ref 8.9–10.3)
GFR calc Af Amer: >60 mL/min (ref >60)
Glucose, Bld: 204 mg/dL — ABNORMAL HIGH (ref 65–99)
Potassium: 4.4 mmol/L (ref 3.5–5.1)
Sodium: 140 mmol/L (ref 135–145)

## 2014-08-31 LAB — LIPID PANEL
Cholesterol: 148 mg/dL (ref 0–200)
HDL: 32 mg/dL — AB (ref >40)
LDL Cholesterol: 88 mg/dL (ref 0–99)
Total CHOL/HDL Ratio: 4.6 RATIO
Triglycerides: 139 mg/dL (ref <150)
VLDL: 28 mg/dL (ref 0–40)

## 2014-08-31 LAB — GLUCOSE, CAPILLARY
GLUCOSE-CAPILLARY: 190 mg/dL — AB (ref 65–99)
GLUCOSE-CAPILLARY: 238 mg/dL — AB (ref 65–99)
Glucose-Capillary: 187 mg/dL — ABNORMAL HIGH (ref 65–99)
Glucose-Capillary: 152 mg/dL — ABNORMAL HIGH (ref 65–99)

## 2014-08-31 LAB — TROPONIN I: Troponin I: <0.03 ng/mL (ref <0.031)

## 2014-08-31 MED ORDER — SODIUM CHLORIDE 0.9 % IJ SOLN
INTRAMUSCULAR | Status: AC
  Administered 2014-08-31: 10 mL
  Filled 2014-08-31: qty 60

## 2014-08-31 MED ORDER — SODIUM CHLORIDE 0.9 % IJ SOLN
INTRAMUSCULAR | Status: AC
  Administered 2014-08-31: 10 mL
  Filled 2014-08-31: qty 250

## 2014-08-31 MED ORDER — IOHEXOL 350 MG/ML SOLN
75.0000 mL | Freq: Once | INTRAVENOUS | Status: AC | PRN
  Administered 2014-08-31: 75 mL via INTRAVENOUS

## 2014-08-31 NOTE — Progress Notes (Signed)
PT Cancellation Note  Patient Details Name: [name] MRN: [id number] DOB: 04-18-1960   Cancelled Treatment:    Reason Eval/Treat Not Completed: PT screened, no needs identified, will sign off.   Demetrios Isaacs L  PT 08/31/2014, 10:14 AM [phone]

## 2014-08-31 NOTE — Progress Notes (Signed)
*  PRELIMINARY RESULTS* Echocardiogram 2D Echocardiogram has been performed.  Leavy Cella 08/31/2014, 2:54 PM

## 2014-08-31 NOTE — Progress Notes (Signed)
SLP Cancellation Note  Patient Details Name: [name] MRN: [id number] DOB: 1960-06-27   Cancelled treatment:       Reason Eval/Treat Not Completed: Other (comment); Pt screened and no acute needs identified. He passed RN swallow screen and was up eating lunch during my screen. Speech/Language WFL. Pt and wife report that pt is "forgetful" and has been since he hit his head "about a year ago". He has seen Dr. Jaynee Eagles and Dr. 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 Pt's "forgetfulness" may be due to hitting his head, chronic pain, and/or poor sleep. He may benefit from outpatient SLP therapy if neurologist feels memory impairment due to cognitive changes. No acute needs identified at this time.   Thank you,  Genene Churn, Jal    [place] 08/31/2014, 4:16 PM

## 2014-08-31 NOTE — Evaluation (Signed)
  Occupational Therapy Evaluation Patient Details Name: [name] MRN: [id number] DOB: March 20, 1960 Today's Date: 08/31/2014    History of Present Illness Pt is a 54 y/o male presenting with 2 days of CP and slurred speech and disorientation. Symptoms are constant and unchanged since onset. Nothing makes them better or worse. Tried taking time off work and relaxing the last couple of days w/o benefit. CP is substernal w/ radiation to L chest wall. Single episode of not knowing where to go while driving w/ wife. Symptoms came on acutely on Monday when his glucose dropped to 40. No recent changes in DM regimen.    Clinical Impression   PTA pt living at home with wife, independent in all B/IADL tasks. Pt reports continued chest pain this am. Pt believes his speech is back to normal, wife not present to confirm. Pt demonstrates good BUE range of motion and strength, 5/5. Pt is independent in B/IADL tasks, no further OT services required.     Follow Up Recommendations  No OT follow up          Precautions / Restrictions Precautions Precautions: None Restrictions Weight Bearing Restrictions: No      Mobility Bed Mobility Overal bed mobility: Independent                Transfers Overall transfer level: Independent                         ADL Overall ADL's : Independent;At baseline                     Lower Body Dressing: Independent                       Vision Vision Assessment?: Yes Eye Alignment: Within Functional Limits Ocular Range of Motion: Within Functional Limits Alignment/Gaze Preference: Within Defined Limits Tracking/Visual Pursuits: Able to track stimulus in all quads without difficulty Saccades: Within functional limits Convergence: Within functional limits Visual Fields: No apparent deficits          Pertinent Vitals/Pain Pain Assessment: No/denies pain     Hand Dominance Right   Extremity/Trunk Assessment Upper  Extremity Assessment Upper Extremity Assessment: Overall WFL for tasks assessed           Communication Communication Communication: No difficulties   Cognition Arousal/Alertness: Awake/alert Behavior During Therapy: WFL for tasks assessed/performed Overall Cognitive Status: Within Functional Limits for tasks assessed                                Home Living Family/patient expects to be discharged to:: Private residence Living Arrangements: Spouse/significant other Available Help at Discharge: Family               Bathroom Shower/Tub: Tub/shower unit Shower/tub characteristics: Architectural technologist: Standard     Home Equipment: None          Prior Functioning/Environment Level of Independence: Independent              End of Session    Activity Tolerance: Patient tolerated treatment well Patient left: in bed;with call bell/phone within reach   Time: 0836-0859 OT Time Calculation (min): 23 min Charges:  OT General Charges $OT Visit: 1 Procedure OT Evaluation $Initial OT Evaluation Tier I: 1 Procedure  Guadelupe Sabin, OTR/L  [phone]  08/31/2014, 10:36 AM

## 2014-08-31 NOTE — Progress Notes (Signed)
[facility] PROGRESS NOTE  [name] [id number] DOB: Mar 22, 1960 DOA: 08/30/2014 PCP: Monico Blitz, MD  Assessment/Plan: Acute encephalopathy, slurred speech, right-sided facial droop, question TIA -Await completion of workup, echo and carotid Dopplers pending. -No arrhythmias noted on telemetry. -Seen by therapy services; no need for follow-up. 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 -Have requested a neurology evaluation for comments on this abnormal finding on MRI and whether this has any bearing on patient's symptoms.  Chest pain -EKG is nonacute, troponins have been negative. -No further cardiac workup is planned for at present.  Acute renal failure -Resolved with IV fluids.  Hyperlipidemia -Continue statin.  Diabetic neuropathy -Continue Neurontin.  Diabetes mellitus -Fair control, continue current regimen, can adjust insulin as needed.   Code Status: Full code Family Communication: Wife at bedside updated on plan of care  Disposition Plan: Home when ready, anticipate 24 hours   Consultants:  Neurology   Antibiotics:  None   Subjective: States he is hungry, is wondering why he has a facial droop if he did not have a stroke  Objective: Filed Vitals:   08/31/14 0340 08/31/14 0540 08/31/14 0940 08/31/14 1340  BP: 128/79 128/79 142/81 141/70  Pulse: 54 57 55 60  Temp: 97.8 F (36.6 C) 98 F (36.7 C) 97.9 F (36.6 C) 97.8 F (36.6 C)  TempSrc: Oral Oral Oral Oral  Resp: 16 18 18 20   Height:      Weight:      SpO2: 100% 98% 98%     Intake/Output Summary (Last 24 hours) at 08/31/14 1634 Last data filed at 08/31/14 1300  Gross per 24 hour  Intake   1340 ml  Output      0 ml  Net   1340 ml   Filed Weights   08/30/14 1408 08/30/14 1740  Weight: 126.1 kg (278 lb) 127.053 kg (280 lb 1.6 oz)    Exam:   General:  Alert, awake,  oriented 3  Cardiovascular: Regular rate and rhythm  Respiratory: Clear to auscultation bilaterally  Abdomen: Soft, nontender, nondistended, positive bowel sounds  Extremities: Trace bilateral pitting edema   Data Reviewed: Basic Metabolic Panel:  Recent Labs Lab 08/30/14 1417 08/31/14 0653  NA 137 140  K 4.0 4.4  CL 103 108  CO2 23 25  GLUCOSE 185* 204*  BUN 19 17  CREATININE 1.32* 1.09  CALCIUM 9.3 8.9   Liver Function Tests:  Recent Labs Lab 08/30/14 1417  AST 32  ALT 28  ALKPHOS 99  BILITOT 0.4  PROT 7.5  ALBUMIN 4.2   No results for input(s): LIPASE, AMYLASE in the last 168 hours. No results for input(s): AMMONIA in the last 168 hours. CBC:  Recent Labs Lab 08/30/14 1417  WBC 7.9  HGB 14.1  HCT 41.7  MCV 78.2  PLT 171   Cardiac Enzymes:  Recent Labs Lab 08/30/14 1811 08/30/14 2317 08/31/14 0653  TROPONINI <0.03 <0.03 <0.03   BNP (last 3 results) No results for input(s): BNP in the last 8760 hours.  ProBNP (last 3 results) No results for input(s): PROBNP in the last 8760 hours.  CBG:  Recent Labs Lab 08/30/14 1749 08/30/14 2215 08/31/14 0738 08/31/14 1115 08/31/14 1608  GLUCAP 132* 138* 190* 187* 238*    No results found for this or any previous visit (from the past 240 hour(s)).   Studies: Ct Angio Head W/cm &/or Wo  Cm  08/31/2014   CLINICAL DATA:  TIA, slurred speech.  Diabetes.  EXAM: CT ANGIOGRAPHY HEAD AND NECK  TECHNIQUE: Multidetector CT imaging of the head and neck was performed using the standard protocol during bolus administration of intravenous contrast. Multiplanar CT image reconstructions and MIPs were obtained to evaluate the vascular anatomy. Carotid stenosis measurements (when applicable) are obtained utilizing NASCET criteria, using the distal internal carotid diameter as the denominator.  CONTRAST:  64mL OMNIPAQUE IOHEXOL 350 MG/ML SOLN  COMPARISON:  MRI head 08/30/2014  FINDINGS: CT HEAD  Brain: Ventricle size  normal. Cerebral volume normal. Negative for acute or chronic ischemia. Negative for hemorrhage or mass.  Calvarium and skull base: Negative  Paranasal sinuses: Negative  Orbits: Negative  CTA NECK  Aortic arch: Normal aortic arch without aneurysm or dissection. Mild atherosclerotic disease in the proximal great vessels without significant stenosis. Lung apices are clear.  Right carotid system: Right carotid artery widely patent without significant stenosis or dissection. Mild atherosclerotic disease in the carotid bulb.  Left carotid system: Left carotid widely patent without stenosis or dissection. Mild atherosclerotic plaque in the carotid bulb.  Vertebral arteries:Left vertebral dominant. Moderately severe calcific stenosis at the origin of the left vertebral artery. Right vertebral artery is patent to PICA without significant contribution to the basilar. Moderate calcific stenosis distal left vertebral artery and distal right vertebral artery.  Skeleton: Disc degeneration and spondylosis in the cervical spine. No acute bony abnormality.  Other neck: Negative for mass or adenopathy  CTA HEAD  Anterior circulation: Cavernous carotid widely patent bilaterally without stenosis. Anterior and middle cerebral arteries widely patent bilaterally.  Posterior circulation: Moderately severe calcific stenosis distal left vertebral artery. Moderately severe calcific stenosis distal right vertebral artery. Right vertebral artery ends in PICA. Left PICA also patent. Basilar patent. Superior cerebellar and posterior cerebral arteries patent bilaterally without stenosis.  Venous sinuses: Patent  Anatomic variants: Negative for cerebral aneurysm  Delayed phase: Normal enhancement of the brain  IMPRESSION: No acute intracranial abnormality.  Very mild atherosclerotic disease in the carotid bulb bilaterally without significant carotid stenosis.  Moderate calcific stenosis proximal left vertebral artery and moderate calcific  stenosis distal vertebral artery bilaterally. Otherwise no significant intracranial stenosis.   Electronically Signed   By: Franchot [name] M.D.   On: 08/31/2014 09:12   Dg Chest 2 View  08/30/2014   CLINICAL DATA:  Chest pain for 3 days  EXAM: CHEST - 2 VIEW  COMPARISON:  01/12/2013  FINDINGS: The heart size and mediastinal contours are within normal limits. Both lungs are clear. The visualized skeletal structures are unremarkable.  IMPRESSION: No active disease.   Electronically Signed   By: Inez Catalina M.D.   On: 08/30/2014 15:48   Ct Angio Neck W/cm &/or Wo/cm  08/31/2014   CLINICAL DATA:  TIA, slurred speech.  Diabetes.  EXAM: CT ANGIOGRAPHY HEAD AND NECK  TECHNIQUE: Multidetector CT imaging of the head and neck was performed using the standard protocol during bolus administration of intravenous contrast. Multiplanar CT image reconstructions and MIPs were obtained to evaluate the vascular anatomy. Carotid stenosis measurements (when applicable) are obtained utilizing NASCET criteria, using the distal internal carotid diameter as the denominator.  CONTRAST:  35mL OMNIPAQUE IOHEXOL 350 MG/ML SOLN  COMPARISON:  MRI head 08/30/2014  FINDINGS: CT HEAD  Brain: Ventricle size normal. Cerebral volume normal. Negative for acute or chronic ischemia. Negative for hemorrhage or mass.  Calvarium and skull base: Negative  Paranasal sinuses: Negative  Orbits: Negative  CTA NECK  Aortic arch: Normal aortic arch without aneurysm or dissection. Mild atherosclerotic disease in the proximal great vessels without significant stenosis. Lung apices are clear.  Right carotid system: Right carotid artery widely patent without significant stenosis or dissection. Mild atherosclerotic disease in the carotid bulb.  Left carotid system: Left carotid widely patent without stenosis or dissection. Mild atherosclerotic plaque in the carotid bulb.  Vertebral arteries:Left vertebral dominant. Moderately severe calcific stenosis at the  origin of the left vertebral artery. Right vertebral artery is patent to PICA without significant contribution to the basilar. Moderate calcific stenosis distal left vertebral artery and distal right vertebral artery.  Skeleton: Disc degeneration and spondylosis in the cervical spine. No acute bony abnormality.  Other neck: Negative for mass or adenopathy  CTA HEAD  Anterior circulation: Cavernous carotid widely patent bilaterally without stenosis. Anterior and middle cerebral arteries widely patent bilaterally.  Posterior circulation: Moderately severe calcific stenosis distal left vertebral artery. Moderately severe calcific stenosis distal right vertebral artery. Right vertebral artery ends in PICA. Left PICA also patent. Basilar patent. Superior cerebellar and posterior cerebral arteries patent bilaterally without stenosis.  Venous sinuses: Patent  Anatomic variants: Negative for cerebral aneurysm  Delayed phase: Normal enhancement of the brain  IMPRESSION: No acute intracranial abnormality.  Very mild atherosclerotic disease in the carotid bulb bilaterally without significant carotid stenosis.  Moderate calcific stenosis proximal left vertebral artery and moderate calcific stenosis distal vertebral artery bilaterally. Otherwise no significant intracranial stenosis.   Electronically Signed   By: Franchot [name] M.D.   On: 08/31/2014 09:12   Mr Brain Wo Contrast  08/30/2014   CLINICAL DATA:  Slurred speech.  Weakness.  Headache for 2 days.  EXAM: MRI HEAD WITHOUT CONTRAST  TECHNIQUE: Multiplanar, multiecho pulse sequences of the brain and surrounding structures were obtained without intravenous contrast.  COMPARISON:  None.  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  No acute infarct, hemorrhage, or mass lesion is present. There is no significant white matter disease. Ventricles are of normal size. No significant extraaxial fluid collection is present.  Flow is present in the major  intracranial arteries. The globes and orbits are intact. The paranasal sinuses are clear. There is some fluid in the mastoid air cells bilaterally. No obstructing nasopharyngeal lesion is present.  The skullbase is within normal limits. Midline structures demonstrate mild degenerative change at C3-4. Intracranial midline structures are unremarkable.  IMPRESSION: 1. Normal MRI appearance of the brain for age. 2. 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 3. Degenerative changes in the upper cervical spine.   Electronically Signed   By: San Morelle M.D.   On: 08/30/2014 15:13   US Carotid Bilateral  08/31/2014   CLINICAL DATA:  TIA. Dizziness. History of hypertension, hyperlipidemia and diabetes. Former smoker.  EXAM: BILATERAL CAROTID DUPLEX ULTRASOUND  TECHNIQUE: Pearline Cables scale imaging, color Doppler and duplex ultrasound were performed of bilateral carotid and vertebral arteries in the neck.  COMPARISON:  None.  FINDINGS: Criteria: Quantification of carotid stenosis is based on velocity parameters that correlate the residual internal carotid diameter with NASCET-based stenosis levels, using the diameter of the distal internal carotid lumen as the denominator for stenosis measurement.  The following velocity measurements were obtained:  RIGHT  ICA:  93/26 cm/sec  CCA:  0000000 cm/sec  SYSTOLIC ICA/CCA RATIO:  1.0  DIASTOLIC ICA/CCA RATIO:  1.2  ECA:  120 cm/sec  LEFT  ICA:  99/26 cm/sec  CCA:  123456 cm/sec  SYSTOLIC ICA/CCA RATIO:  [id number]  DIASTOLIC ICA/CCA RATIO:  1.3  ECA:  176 cm/sec  RIGHT CAROTID ARTERY: There is a very minimal amount of eccentric mixed echogenic plaque within the right carotid bulb (representative images 25-27), extending to involve the origin proximal aspect the right internal carotid artery (image 41), not resulting in elevated peak systolic velocities within the interrogated course of the right internal carotid  artery to suggest a hemodynamically significant stenosis.  RIGHT VERTEBRAL ARTERY:  Antegrade flow  LEFT CAROTID ARTERY: There is a minimal amount of eccentric mixed echogenic plaque within the left carotid bulb (representative images 70 through 71), extending to involve the origin and proximal aspect the left internal carotid artery (image 88), not resulting in elevated peak systolic velocities within the interrogated course of the left internal carotid artery. Borderline elevated peak systolic velocity within distal aspect the left internal carotid artery is felt to be factitious Lee elevated due to sampling at a location of turbulent flow (representative image 96).  LEFT VERTEBRAL ARTERY:  Antegrade flow  IMPRESSION: Minimal amount of bilateral atherosclerotic plaque, left subjectively greater than right, not resulting in a hemodynamically significant stenosis.   Electronically Signed   By: Sandi Mariscal M.D.   On: 08/31/2014 14:25    Scheduled Meds: . aspirin EC  81 mg Oral Daily  . atorvastatin  20 mg Oral q1800  . gabapentin  2,400 mg Oral QHS  . heparin  5,000 Units Subcutaneous 3 times per day  . insulin aspart  0-20 Units Subcutaneous TID WC  . insulin aspart  0-5 Units Subcutaneous QHS   Continuous Infusions: . sodium chloride 75 mL/hr at 08/31/14 0530    Principal Problem:   TIA (transient ischemic attack) Active Problems:   Chest pain   Diabetes with skin complication   Neuropathy   AKI (acute kidney injury)   HLD (hyperlipidemia)    Time spent: 25 minutes. Greater than 50% of this time was spent in direct contact with the patient coordinating care.    Lelon Frohlich  [facility] Pager [phone]  If 7PM-7AM, please contact night-coverage at [email or web address], password [facility] 08/31/2014, 4:34 PM  LOS: 1 day

## 2014-08-31 NOTE — Consult Note (Signed)
[place] A. Merlene Laughter, MD     [email or web address]          [name] is an 54 y.o. male.   ASSESSMENT/PLAN: 1. Acute onset of dysarthria and confusion. I believe the most likely etiology is due to prolonged hypoglycemia. TIA is ruled out given the prolonged episodes for over 2 days. Complex partial seizures are a possibility and should be ruled out/evaluated for. Consequently, an EEG will be obtained. Tegretol level will also be obtained. Negative imaging rules out ischemia or other lesions multiple sclerosis.  2. Recalcitrant posttraumatic headaches. Consider oupatient botox injs.  3. Diabetic polyneuropathy.  4. Chest pain/shortness of breath with increased d-dimers. Treatment/evaluation deferred to primary care hospitalist.  The patient presents with the acute onset of persistent dysarthria and confusion starting 3-4 days ago. The patient indicates that this started sometime on Monday. The symptoms were associated with significant hypoglycemia in the 40s which lasted for about 2 hours. Unfortunately, despite the patient's blood sugar being corrected, he still persisted in being confused and having slurred speech as reported by his wife. Symptoms persisted for 2-3 days and that the patient decided to seek medical attention for these symptoms. It appears that he also had associated dyspnea and chest pain with these symptoms which also prompted him to sleep medical attention. The patient seemed to have the dysarthria and the confusion episodically over the last few days. He denies focal numbness, weakness, dysphagia, diplopia or blurred vision. No GI or GU symptoms are reported. The patient reports having chronic headaches over the last year or so. He did sustain posterior head injury and since then he has had essentially daily continuous headaches. The headaches start from the central occipital region and shoots anteriorly. He did see a neurologist in [place] and  tried multiple medications without any benefit. It appears that he also may have had that type of injection without benefit. I do not believe these were Botox injections although he is not certain. He was referred to [facility] and they also tried several treatments without any benefit. The patient tells me that he got his left toe amputated after he stepped in the nail and that the wound became infected and was not healing. The review of systems otherwise negative.  GENERAL: Pleasant in no acute distress.  HEENT: Supple. Atraumatic normocephalic.   ABDOMEN: soft  EXTREMITIES: Mild reddening of the distal legs. Amputated left great toe.  BACK: Normal.  SKIN: Normal by inspection.    MENTAL STATUS: Alert and oriented. Speech, language and cognition are generally intact. Judgment and insight normal.   CRANIAL NERVES: Pupils are equal, round and reactive to light and accommodation; extra ocular movements are full, there is no significant nystagmus; visual fields are full; upper and lower facial muscles are normal in strength and symmetric, there is no flattening of the nasolabial folds; tongue is midline; uvula is midline; shoulder elevation is normal.  MOTOR: Normal tone, bulk and strength; no pronator drift.  COORDINATION: Left finger to nose is normal, right finger to nose is normal, No rest tremor; no intention tremor; no postural tremor; no bradykinesia.  REFLEXES: Deep tendon reflexes are symmetrical and normal, but diminished at knees. Babinski reflexes are flexor R.  SENSATION: Normal to light touch.     The patient's brain MRI is reviewed in person. There is no acute lesion seen on diffusion imaging. There is no significant signal seen on the white matter areas. No lesions are seen on the echo gradient contrast  indicating chronic hemorrhage. No significant vascular malformation seen on T2 sequences. The scan is essentially unremarkable.  Blood pressure 141/70, pulse 60, temperature  97.8 F (36.6 C), temperature source Oral, resp. rate 20, height _0  (1.981 m), weight 127.053 kg (280 lb 1.6 oz), SpO2 98 %.  Past Medical History  Diagnosis Date  . Chest pain Sept. 2005    multiple caths  /  cath 06/15/2010..normal coronaries,  EF 60%   (false postive nuclear in the past)  . IDDM (insulin dependent diabetes mellitus)   . Hypertension   . Dyslipidemia     mixed  . Gastroparesis   . Pulmonary embolus     Hx of small left lower lobe  pulmonary embolus  . Overweight(278.02)   . Peripheral vascular disease   . Headache(784.0)   . Neuropathy associated with endocrine disorder   . Pulmonary embolus 2010ish  . Fibromyalgia   . Cervicalgia   . Urticaria, unspecified   . Allergic rhinitis, cause unspecified   . Lumbago   . 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 Esophageal reflux   . Type II or unspecified type diabetes mellitus with neurological manifestations, not stated as uncontrolled     Past Surgical History  Procedure Laterality Date  . Back surgery  1999    x3  . Shoulder arthroscopy w/ rotator cuff repair Right   . Achilles tendon repair Left 2013  . Appendectomy    . Lumbar laminectomy/decompression microdiscectomy Right 01/12/2013    Procedure: Right Lumbar Three-Four Laminotomy/Foraminotomy;  Surgeon: Floyce Stakes, MD;  Location: [facility];  Service: Neurosurgery;  Laterality: Right;  Right Lumbar Three-Four Laminotomy/Foraminotomy  . 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 Nissen fundoplication    . Amputation Left 07/21/2013    Procedure: PARTIAL AMPUTATION 2ND TOE LEFT FOOT;  Surgeon: Marcheta Grammes, DPM;  Location: [facility];  Service: Podiatry;  Laterality: Left;  . Foot arthrodesis Right 01/11/2014    Procedure: ARTHRODESIS  INTERPHALANGEAL JOINT HALLUX RIGHT FOOT;  Surgeon: Marcheta Grammes, DPM;  Location: [facility];  Service: Podiatry;  Laterality: Right;    Family History  Problem Relation Age of Onset  . Heart attack Father 59  . Hyperlipidemia Father   . Hypertension Father   . Heart attack Mother 38  . Diabetes Mother   . Hypertension Mother   . Hyperlipidemia Mother   . Healthy Brother   . Seizures Brother   . Migraines Neg Hx     Social History:  reports that he quit smoking about 32 years ago. His smoking use included Cigarettes. He has a 15 pack-year smoking history. He has never used smokeless tobacco. He reports that he does not drink alcohol or use illicit drugs.  Allergies:  Allergies  Allergen Reactions  . Reglan [Metoclopramide] Itching and Rash    Medications: Prior to Admission medications   Medication Sig Start Date End Date Taking? Authorizing Provider  aspirin EC 81 MG tablet Take 81 mg by mouth daily.   Yes Historical Provider, MD  atorvastatin (LIPITOR) 20 MG tablet Take 20 mg by mouth daily.   Yes Historical Provider, MD  gabapentin (NEURONTIN) 600 MG tablet Take 2,400 mg by mouth at bedtime.   Yes Historical Provider, MD  glimepiride (AMARYL) 2 MG tablet Take 2 mg  by mouth 2 (two) times daily.    Yes Historical Provider, MD  insulin aspart (NOVOLOG) 100 UNIT/ML injection Inject 10-50 Units into the skin 3 (three) times daily before meals. Per sliding scale.   Yes Historical Provider, MD  insulin detemir (LEVEMIR) 100 UNIT/ML injection Inject 15-50 Units into the skin 3 (three) times daily. Based on sliding scale   Yes Historical Provider, MD  lidocaine (XYLOCAINE) 5 % ointment Apply 1 application topically daily as needed for mild pain or moderate pain.  08/23/14  Yes Historical Provider, MD  linagliptin (TRADJENTA) 5 MG TABS tablet Take 5 mg by mouth daily.   Yes Historical Provider, MD  metFORMIN (GLUCOPHAGE) 500 MG tablet Take 1,000 mg by mouth 2 (two) times daily with a  meal.   Yes Historical Provider, MD  baclofen (ED BACLOFEN) 10 MG tablet Take 1 tablet (10 mg total) by mouth 3 (three) times daily. Patient not taking: Reported on 08/30/2014 04/28/14   Melvenia Beam, MD  carbamazepine (TEGRETOL-XR) 200 MG 12 hr tablet Take 2 tablets (400 mg total) by mouth 2 (two) times daily. Patient not taking: Reported on 08/30/2014 04/28/14   Melvenia Beam, MD    Scheduled Meds: . aspirin EC  81 mg Oral Daily  . atorvastatin  20 mg Oral q1800  . gabapentin  2,400 mg Oral QHS  . heparin  5,000 Units Subcutaneous 3 times per day  . insulin aspart  0-20 Units Subcutaneous TID WC  . insulin aspart  0-5 Units Subcutaneous QHS   Continuous Infusions: . sodium chloride 75 mL/hr at 08/31/14 0530   PRN Meds:.acetaminophen **OR** acetaminophen, gi cocktail, ondansetron **OR** ondansetron (ZOFRAN) IV, oxyCODONE     Results for orders placed or performed during the hospital encounter of 08/30/14 (from the past 48 hour(s))  Comprehensive metabolic panel     Status: Abnormal   Collection Time: 08/30/14  2:17 PM  Result Value Ref Range   Sodium 137 135 - 145 mmol/L   Potassium 4.0 3.5 - 5.1 mmol/L   Chloride 103 101 - 111 mmol/L   CO2 23 22 - 32 mmol/L   Glucose, Bld 185 (H) 65 - 99 mg/dL   BUN 19 6 - 20 mg/dL   Creatinine, Ser 1.32 (H) 0.61 - 1.24 mg/dL   Calcium 9.3 8.9 - 10.3 mg/dL   Total Protein 7.5 6.5 - 8.1 g/dL   Albumin 4.2 3.5 - 5.0 g/dL   AST 32 15 - 41 U/L   ALT 28 17 - 63 U/L   Alkaline Phosphatase 99 38 - 126 U/L   Total Bilirubin 0.4 0.3 - 1.2 mg/dL   GFR calc non Af Amer 59 (L) >60 mL/min   GFR calc Af Amer >60 >60 mL/min    Comment: (NOTE) The eGFR has been calculated using the CKD EPI equation. This calculation has not been validated in all clinical situations. eGFR's persistently <60 mL/min signify possible Chronic Kidney Disease.    Anion gap 11 5 - 15  CBC     Status: None   Collection Time: 08/30/14  2:17 PM  Result Value Ref Range    WBC 7.9 4.0 - 10.5 K/uL   RBC 5.33 4.22 - 5.81 MIL/uL   Hemoglobin 14.1 13.0 - 17.0 g/dL   HCT 41.7 39.0 - 52.0 %   MCV 78.2 78.0 - 100.0 fL   MCH 26.5 26.0 - 34.0 pg   MCHC 33.8 30.0 - 36.0 g/dL   RDW 15.2 11.5 - 15.5 %  Platelets 171 150 - 400 K/uL  I-stat troponin, ED (not at [facility])     Status: None   Collection Time: 08/30/14  2:28 PM  Result Value Ref Range   Troponin i, poc 0.00 0.00 - 0.08 ng/mL   Comment 3            Comment: Due to the release kinetics of cTnI, a negative result within the first hours of the onset of symptoms does not rule out myocardial infarction with certainty. If myocardial infarction is still suspected, repeat the test at appropriate intervals.   Urinalysis, Routine w reflex microscopic (not at [facility])     Status: Abnormal   Collection Time: 08/30/14  2:43 PM  Result Value Ref Range   Color, Urine YELLOW YELLOW   APPearance CLEAR CLEAR   Specific Gravity, Urine 1.025 1.005 - 1.030   pH 5.5 5.0 - 8.0   Glucose, UA >1000 (A) NEGATIVE mg/dL   Hgb urine dipstick NEGATIVE NEGATIVE   Bilirubin Urine NEGATIVE NEGATIVE   Ketones, ur NEGATIVE NEGATIVE mg/dL   Protein, ur NEGATIVE NEGATIVE mg/dL   Urobilinogen, UA 0.2 0.0 - 1.0 mg/dL   Nitrite NEGATIVE NEGATIVE   Leukocytes, UA NEGATIVE NEGATIVE  Urine microscopic-add on     Status: None   Collection Time: 08/30/14  2:43 PM  Result Value Ref Range   WBC, UA 0-2 <3 WBC/hpf   RBC / HPF 0-2 <3 RBC/hpf  Glucose, capillary     Status: Abnormal   Collection Time: 08/30/14  5:49 PM  Result Value Ref Range   Glucose-Capillary 132 (H) 65 - 99 mg/dL  Troponin I (q 6hr x 3)     Status: None   Collection Time: 08/30/14  6:11 PM  Result Value Ref Range   Troponin I <0.03 <0.031 ng/mL    Comment:        NO INDICATION OF MYOCARDIAL INJURY.   D-dimer, quantitative (not at [facility])     Status: Abnormal   Collection Time: 08/30/14  6:11 PM  Result Value Ref Range   D-Dimer, Quant 0.76 (H) 0.00 - 0.48  ug/mL-FEU    Comment:        AT THE INHOUSE ESTABLISHED CUTOFF VALUE OF 0.48 ug/mL FEU, THIS ASSAY HAS BEEN DOCUMENTED IN THE LITERATURE TO HAVE A SENSITIVITY AND NEGATIVE PREDICTIVE VALUE OF AT LEAST 98 TO 99%.  THE TEST RESULT SHOULD BE CORRELATED WITH AN ASSESSMENT OF THE CLINICAL PROBABILITY OF DVT / VTE.   Glucose, capillary     Status: Abnormal   Collection Time: 08/30/14 10:15 PM  Result Value Ref Range   Glucose-Capillary 138 (H) 65 - 99 mg/dL   Comment 1 Notify RN    Comment 2 Document in Chart   Troponin I (q 6hr x 3)     Status: None   Collection Time: 08/30/14 11:17 PM  Result Value Ref Range   Troponin I <0.03 <0.031 ng/mL    Comment:        NO INDICATION OF MYOCARDIAL INJURY.   Lipid panel     Status: Abnormal   Collection Time: 08/31/14  6:53 AM  Result Value Ref Range   Cholesterol 148 0 - 200 mg/dL   Triglycerides 139 <150 mg/dL   HDL 32 (L) >40 mg/dL   Total CHOL/HDL Ratio 4.6 RATIO   VLDL 28 0 - 40 mg/dL   LDL Cholesterol 88 0 - 99 mg/dL    Comment:        Total Cholesterol/HDL:CHD  Risk Coronary Heart Disease Risk Table                     Men   Women  1/2 Average Risk   3.4   3.3  Average Risk       5.0   4.4  2 X Average Risk   9.6   7.1  3 X Average Risk  23.4   11.0        Use the calculated Patient Ratio above and the CHD Risk Table to determine the patient's CHD Risk.        ATP III CLASSIFICATION (LDL):  <100     mg/dL   Optimal  100-129  mg/dL   Near or Above                    Optimal  130-159  mg/dL   Borderline  160-189  mg/dL   High  >190     mg/dL   Very High   Basic metabolic panel     Status: Abnormal   Collection Time: 08/31/14  6:53 AM  Result Value Ref Range   Sodium 140 135 - 145 mmol/L   Potassium 4.4 3.5 - 5.1 mmol/L   Chloride 108 101 - 111 mmol/L   CO2 25 22 - 32 mmol/L   Glucose, Bld 204 (H) 65 - 99 mg/dL   BUN 17 6 - 20 mg/dL   Creatinine, Ser 1.09 0.61 - 1.24 mg/dL   Calcium 8.9 8.9 - 10.3 mg/dL   GFR  calc non Af Amer >60 >60 mL/min   GFR calc Af Amer >60 >60 mL/min    Comment: (NOTE) The eGFR has been calculated using the CKD EPI equation. This calculation has not been validated in all clinical situations. eGFR's persistently <60 mL/min signify possible Chronic Kidney Disease.    Anion gap 7 5 - 15  Troponin I (q 6hr x 3)     Status: None   Collection Time: 08/31/14  6:53 AM  Result Value Ref Range   Troponin I <0.03 <0.031 ng/mL    Comment:        NO INDICATION OF MYOCARDIAL INJURY.   Glucose, capillary     Status: Abnormal   Collection Time: 08/31/14  7:38 AM  Result Value Ref Range   Glucose-Capillary 190 (H) 65 - 99 mg/dL  Glucose, capillary     Status: Abnormal   Collection Time: 08/31/14 11:15 AM  Result Value Ref Range   Glucose-Capillary 187 (H) 65 - 99 mg/dL    Studies/Results:  BRAIN/NECK CTA CT HEAD  Brain: Ventricle size normal. Cerebral volume normal. Negative for acute or chronic ischemia. Negative for hemorrhage or mass.  Calvarium and skull base: Negative  Paranasal sinuses: Negative  Orbits: Negative  CTA NECK  Aortic arch: Normal aortic arch without aneurysm or dissection. Mild atherosclerotic disease in the proximal great vessels without significant stenosis. Lung apices are clear.  Right carotid system: Right carotid artery widely patent without significant stenosis or dissection. Mild atherosclerotic disease in the carotid bulb.  Left carotid system: Left carotid widely patent without stenosis or dissection. Mild atherosclerotic plaque in the carotid bulb.  Vertebral arteries:Left vertebral dominant. Moderately severe calcific stenosis at the origin of the left vertebral artery. Right vertebral artery is patent to PICA without significant contribution to the basilar. Moderate calcific stenosis distal left vertebral artery and distal right vertebral artery.  Skeleton: Disc degeneration and spondylosis in the cervical  spine.  No acute bony abnormality.  Other neck: Negative for mass or adenopathy  CTA HEAD  Anterior circulation: Cavernous carotid widely patent bilaterally without stenosis. Anterior and middle cerebral arteries widely patent bilaterally.  Posterior circulation: Moderately severe calcific stenosis distal left vertebral artery. Moderately severe calcific stenosis distal right vertebral artery. Right vertebral artery ends in PICA. Left PICA also patent. Basilar patent. Superior cerebellar and posterior cerebral arteries patent bilaterally without stenosis.  Venous sinuses: Patent  Anatomic variants: Negative for cerebral aneurysm  Delayed phase: Normal enhancement of the brain  IMPRESSION: No acute intracranial abnormality.  Very mild atherosclerotic disease in the carotid bulb bilaterally without significant carotid stenosis.  Moderate calcific stenosis proximal left vertebral artery and moderate calcific stenosis distal vertebral artery bilaterally. Otherwise no significant intracranial stenosis.  BRAIN MRI 1. Normal MRI appearance of the brain for age. 2. 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 3. Degenerative changes in the upper cervical spine.  Mauricia Mertens A. Merlene Laughter, M.D.  Diplomate, Tax adviser of Psychiatry and Neurology ( Neurology). 08/31/2014, 4:16 PM

## 2014-08-31 NOTE — Care Management Note (Signed)
Case Management Note  Patient Details  Name: [name] MRN: [id number] Date of Birth: 08-21-1960  Subjective/Objective:                  Pt admitted from home with TIA. Pt lives with his wife and will return home at discharge. Pt is independent with ADL's.   Action/Plan: No CM needs noted. Anticipate discharge within 24 hours. Neuro consult pending.  Expected Discharge Date:                  Expected Discharge Plan:  Home/Self Care  In-House Referral:  NA  Discharge planning Services  CM Consult  Post Acute Care Choice:  NA Choice offered to:  NA  DME Arranged:    DME Agency:     HH Arranged:    HH Agency:     Status of Service:  Completed, signed off  Medicare Important Message Given:    Date Medicare IM Given:    Medicare IM give by:    Date Additional Medicare IM Given:    Additional Medicare Important Message give by:     If discussed at [place] of Stay Meetings, dates discussed:    Additional Comments:  [name], RN 08/31/2014, 12:55 PM

## 2014-09-01 ENCOUNTER — Inpatient Hospital Stay (HOSPITAL_COMMUNITY)
Admit: 2014-09-01 | Discharge: 2014-09-01 | Disposition: A | Discharge status: Home / Self Care | Payer: Medicare HMO | Attending: Neurology | Admitting: Neurology

## 2014-09-01 LAB — GLUCOSE, CAPILLARY
GLUCOSE-CAPILLARY: 127 mg/dL — AB (ref 65–99)
Glucose-Capillary: 235 mg/dL — ABNORMAL HIGH (ref 65–99)
Glucose-Capillary: 228 mg/dL — ABNORMAL HIGH (ref 65–99)

## 2014-09-01 LAB — HEMOGLOBIN A1C
Hgb A1c MFr Bld: 8.7 % — ABNORMAL HIGH (ref 4.8–5.6)
Mean Plasma Glucose: 203 mg/dL

## 2014-09-01 NOTE — Procedures (Signed)
  [place] A. Merlene Laughter, MD     [email or web address]           HISTORY: The patient is a 54 year old man who presents with a prolonged episodes of confusion services for possible seizures.  MEDICATIONS: Scheduled Meds: . aspirin EC  81 mg Oral Daily  . atorvastatin  20 mg Oral q1800  . gabapentin  2,400 mg Oral QHS  . heparin  5,000 Units Subcutaneous 3 times per day  . insulin aspart  0-20 Units Subcutaneous TID WC  . insulin aspart  0-5 Units Subcutaneous QHS   Continuous Infusions: . sodium chloride 75 mL/hr at 09/01/14 0922   PRN Meds:.acetaminophen **OR** acetaminophen, gi cocktail, ondansetron **OR** ondansetron (ZOFRAN) IV, oxyCODONE  Prior to Admission medications   Medication Sig Start Date End Date Taking? Authorizing Provider  aspirin EC 81 MG tablet Take 81 mg by mouth daily.   Yes Historical Provider, MD  atorvastatin (LIPITOR) 20 MG tablet Take 20 mg by mouth daily.   Yes Historical Provider, MD  gabapentin (NEURONTIN) 600 MG tablet Take 2,400 mg by mouth at bedtime.   Yes Historical Provider, MD  glimepiride (AMARYL) 2 MG tablet Take 2 mg by mouth 2 (two) times daily.    Yes Historical Provider, MD  insulin aspart (NOVOLOG) 100 UNIT/ML injection Inject 10-50 Units into the skin 3 (three) times daily before meals. Per sliding scale.   Yes Historical Provider, MD  insulin detemir (LEVEMIR) 100 UNIT/ML injection Inject 15-50 Units into the skin 3 (three) times daily. Based on sliding scale   Yes Historical Provider, MD  lidocaine (XYLOCAINE) 5 % ointment Apply 1 application topically daily as needed for mild pain or moderate pain.  08/23/14  Yes Historical Provider, MD  linagliptin (TRADJENTA) 5 MG TABS tablet Take 5 mg by mouth daily.   Yes Historical Provider, MD  metFORMIN (GLUCOPHAGE) 500 MG tablet Take 1,000 mg by mouth 2 (two) times daily with a meal.   Yes Historical Provider, MD  baclofen (ED BACLOFEN) 10 MG tablet Take 1 tablet (10 mg total) by  mouth 3 (three) times daily. Patient not taking: Reported on 08/30/2014 04/28/14   Melvenia Beam, MD  carbamazepine (TEGRETOL-XR) 200 MG 12 hr tablet Take 2 tablets (400 mg total) by mouth 2 (two) times daily. Patient not taking: Reported on 08/30/2014 04/28/14   Melvenia Beam, MD      ANALYSIS: A 16 channel recording using standard 10 20 measurements is conducted for 21 minutes. There is a well-formed posterior dominant rhythm of 11 Hz which attenuates with eye opening. The electrical activity is of low voltage. Awake and drowsy activities are observed. Photic simulation is carried out without abnormal changes in the background activity. There is no focal or lateralized slowing. There is no epileptiform activity is observed.   IMPRESSION: This is a normal recording of the awake and drowsy states.      Eleftherios Dudenhoeffer A. Merlene Laughter, M.D.  Diplomate, Tax adviser of Psychiatry and Neurology ( Neurology).

## 2014-09-01 NOTE — Care Management Note (Signed)
Case Management Note  Patient Details  Name: ADARIAN GOTWALT MRN: [id number] Date of Birth: [birth year]  Subjective/Objective:                    Action/Plan:   Expected Discharge Date:                  Expected Discharge Plan:  Home/Self Care  In-House Referral:  NA  Discharge planning Services  CM Consult  Post Acute Care Choice:  NA Choice offered to:  NA  DME Arranged:    DME Agency:     HH Arranged:    HH Agency:     Status of Service:  Completed, signed off  Medicare Important Message Given:  Yes-second notification given Date Medicare IM Given:    Medicare IM give by:    Date Additional Medicare IM Given:    Additional Medicare Important Message give by:     If discussed at [place] of Stay Meetings, dates discussed:    Additional Comments: Anticipate discharge today. No CM needs noted. Christinia Gully Joaquin, RN 09/01/2014, 12:49 PM

## 2014-09-01 NOTE — Progress Notes (Addendum)
Inpatient Diabetes Program Recommendations  AACE/ADA: New Consensus Statement on Inpatient Glycemic Control (2013)  Target Ranges:  Prepandial:   less than 140 mg/dL      Peak postprandial:   less than 180 mg/dL (1-2 hours)      Critically ill patients:  140 - 180 mg/dL  Results for DAJION, BRIAR (MRN [id number]) as of 09/01/2014 07:52  Ref. Range 08/31/2014 07:38 08/31/2014 11:15 08/31/2014 16:08 08/31/2014 21:52 09/01/2014 07:22  Glucose-Capillary Latest Ref Range: 65-99 mg/dL 190 (H) 187 (H) 238 (H) 152 (H) 235 (H)   Results for JAYVONTE, KINT (MRN [id number]) as of 09/01/2014 07:52  Ref. Range 08/30/2014 14:16  Hemoglobin A1C Latest Ref Range: 4.8-5.6 % 8.7 (H)    Diabetes history: DM2 Outpatient Diabetes medications:Per home medication list, Levemir 15-50 units TID with meals, Novolog 10-60 units TID with meals, Amaryl 2 mg BID, Tradjenta 5 mg daily, Metformin 1000 mg BID Current orders for Inpatient glycemic control: Novolog 0-20 units TID with meals, Novolog 0-5 units HS  Inpatient Diabetes Program Recommendations Insulin - Basal: Please consider ordering Levemir 10 units daily starting now. Insulin - Meal Coverage: May want to consider ordering Novolog 3 units TID with meals for meal coverage (in additon to Novolog correction).  09/01/2014@11 :08-Spoke with patient about diabetes and home regimen for diabetes control. Patient reports that he is followed by his PCP (Dr. Cyndi Bender) for diabetes management and currently he takes Levemir 15-50 units TID with meals, Novolog 10-60 units TID with meals, Armaryl 2 mg BID, Tradjenta 5 mg daily, and Metformin 1000 mg BID as an outpatient for diabetes control. Patient verified that he is indeed taking Levemir TID with meals. Patient states that he bases his Levemir and Novolog dosages with what his glucose is running ("using a sliding scale"). Expressed concern that he is taking Levemir TID and patient states that he attended an outpatient diabetes class  last week and the CDE told him he needed to clarify the Levemir TID because she was also concerned. Patient states that he contacted his PCP and was instructed to continue taking Levemir TID as prescribed. Patient states that he checks his glucose 4-5 per day and that it runs a little higher in the mornings and generally 140-170's mg/dl for the most part. Patient reports that he has about 1 hypoglycemic episode once a month. Patient states that he uses 140 mg/dl as his goal glucose because he becomes symptomatic if his glucose drops less than 120 mg/dl. Patient states that he is not followed by an endocrinologist and he would like to be referred to a local endocrinologist to get help with improving diabetes control. Informed patient about Dr. Dorris Fetch (local Endocrinologist) and encouraged patient to talk with his PCP about referring him to Dr. Dorris Fetch. Patient states that he will definitely ask his PCP to refer him. Encouraged patient to continue to check his glucose 4-5 per day and to keep a log of glucose readings as well as how much insulin he is actually taking each time which will help the doctor with making adjustments with DM medications. Patient verbalized understanding of information discussed and he states that he has no further questions at this time related to diabetes.   Thanks, Barnie Alderman, RN, MSN, CCRN, CDE Diabetes Coordinator Inpatient Diabetes Program [phone] (Team Pager from [place] to [place]) [phone] ([facility] office) [phone] [facility] office) [phone] [facility] office)

## 2014-09-01 NOTE — Discharge Summary (Signed)
Physician Discharge Summary  [name] [id number] DOB: March 03, 1960 DOA: 08/30/2014  PCP: Monico Blitz, MD  Admit date: 08/30/2014 Discharge date: 09/01/2014  Time spent: 45 minutes  Recommendations for Outpatient Follow-up:  -Will be discharged home today. -Advised to follow up with Dr. Merlene Laughter in 1 week for results of EEG. -Advised to follow up with PCP in 2 weeks for further DM management.   Discharge Diagnoses:  Principal Problem:   TIA (transient ischemic attack) Active Problems:   Chest pain   Diabetes with skin complication   Neuropathy   AKI (acute kidney injury)   HLD (hyperlipidemia)   Discharge Condition: Stable and improved  Filed Weights   08/30/14 1408 08/30/14 1740  Weight: 126.1 kg (278 lb) 127.053 kg (280 lb 1.6 oz)    History of present illness:  [name] is a 54 y.o. male  2 days of CP and slurred speech and disorientation. Symptoms are constant and unchanged since onset. Nothing makes them better or worse. Tried taking time off work and relaxing the last couple of days w/o benefit. CP is substernal w/ radiation to L chest wall. Single episode of not knowing where to go while driving w/ wife. Symptoms came on acutely on Monday when his glucose dropped to 40. No recent changes in DM regimen.   Hospital Course:   Slurred Speech/Acute Encephalopathy -Seen by neurology, thought to be related to prolonged hypoglycemia prior to admission. -MRI negative for acute CVA. -EEG done with results pending to r/o possibility of focal seizures; patient is anxious for discharge so will have him follow up with Dr. Merlene Laughter in the OP setting for EEG results.  Chest Pain -EKG is nonacute, troponins have been negative. -No further cardiac work up planned for at present.  ARF -Resolved with IVF.  Hyperlipidemia -Continue statin.  Diabetic Neuropathy -Continue neurontin  DM -Fair control. -Given hypoglycemic episodes at home that happen  frequently per patient report, will DC amaryl and tradjenta and continue lantus and metformin. Lantus can be titrated to achieve proper glycemic control. -Will need close OP follow up for diabetic management.  Procedures:  None   Consultations:  Neurology, Dr. Merlene Laughter  Discharge Instructions  Discharge Instructions    Diet - low sodium heart healthy    Complete by:  As directed      Increase activity slowly    Complete by:  As directed             Medication List    STOP taking these medications        baclofen 10 MG tablet  Commonly known as:  ED BACLOFEN     carbamazepine 200 MG 12 hr tablet  Commonly known as:  TEGRETOL-XR     glimepiride 2 MG tablet  Commonly known as:  AMARYL     TRADJENTA 5 MG Tabs tablet  Generic drug:  linagliptin      TAKE these medications        aspirin EC 81 MG tablet  Take 81 mg by mouth daily.     atorvastatin 20 MG tablet  Commonly known as:  LIPITOR  Take 20 mg by mouth daily.     gabapentin 600 MG tablet  Commonly known as:  NEURONTIN  Take 2,400 mg by mouth at bedtime.     insulin aspart 100 UNIT/ML injection  Commonly known as:  novoLOG  Inject 10-50 Units into the skin 3 (three) times daily before meals. Per sliding scale.  insulin detemir 100 UNIT/ML injection  Commonly known as:  LEVEMIR  Inject 15-50 Units into the skin 3 (three) times daily. Based on sliding scale     lidocaine 5 % ointment  Commonly known as:  XYLOCAINE  Apply 1 application topically daily as needed for mild pain or moderate pain.     metFORMIN 500 MG tablet  Commonly known as:  GLUCOPHAGE  Take 1,000 mg by mouth 2 (two) times daily with a meal.       Allergies  Allergen Reactions  . Reglan [Metoclopramide] Itching and Rash       Follow-up Information    Follow up with [facility], KOFI, MD. Schedule an appointment as soon as possible for a visit in 1 week.   Specialty:  Neurology   Contact information:   [address] [place] [address] [phone]        The results of significant diagnostics from this hospitalization (including imaging, microbiology, ancillary and laboratory) are listed below for reference.    Significant Diagnostic Studies: Ct Angio Head W/cm &/or Wo Cm  08/31/2014   CLINICAL DATA:  TIA, slurred speech.  Diabetes.  EXAM: CT ANGIOGRAPHY HEAD AND NECK  TECHNIQUE: Multidetector CT imaging of the head and neck was performed using the standard protocol during bolus administration of intravenous contrast. Multiplanar CT image reconstructions and MIPs were obtained to evaluate the vascular anatomy. Carotid stenosis measurements (when applicable) are obtained utilizing NASCET criteria, using the distal internal carotid diameter as the denominator.  CONTRAST:  31mL OMNIPAQUE IOHEXOL 350 MG/ML SOLN  COMPARISON:  MRI head 08/30/2014  FINDINGS: CT HEAD  Brain: Ventricle size normal. Cerebral volume normal. Negative for acute or chronic ischemia. Negative for hemorrhage or mass.  Calvarium and skull base: Negative  Paranasal sinuses: Negative  Orbits: Negative  CTA NECK  Aortic arch: Normal aortic arch without aneurysm or dissection. Mild atherosclerotic disease in the proximal great vessels without significant stenosis. Lung apices are clear.  Right carotid system: Right carotid artery widely patent without significant stenosis or dissection. Mild atherosclerotic disease in the carotid bulb.  Left carotid system: Left carotid widely patent without stenosis or dissection. Mild atherosclerotic plaque in the carotid bulb.  Vertebral arteries:Left vertebral dominant. Moderately severe calcific stenosis at the origin of the left vertebral artery. Right vertebral artery is patent to PICA without significant contribution to the basilar. Moderate calcific stenosis distal left vertebral artery and distal right vertebral artery.  Skeleton: Disc degeneration and spondylosis in the cervical spine. No acute bony  abnormality.  Other neck: Negative for mass or adenopathy  CTA HEAD  Anterior circulation: Cavernous carotid widely patent bilaterally without stenosis. Anterior and middle cerebral arteries widely patent bilaterally.  Posterior circulation: Moderately severe calcific stenosis distal left vertebral artery. Moderately severe calcific stenosis distal right vertebral artery. Right vertebral artery ends in PICA. Left PICA also patent. Basilar patent. Superior cerebellar and posterior cerebral arteries patent bilaterally without stenosis.  Venous sinuses: Patent  Anatomic variants: Negative for cerebral aneurysm  Delayed phase: Normal enhancement of the brain  IMPRESSION: No acute intracranial abnormality.  Very mild atherosclerotic disease in the carotid bulb bilaterally without significant carotid stenosis.  Moderate calcific stenosis proximal left vertebral artery and moderate calcific stenosis distal vertebral artery bilaterally. Otherwise no significant intracranial stenosis.   Electronically Signed   By: Franchot [name] M.D.   On: 08/31/2014 09:12   Dg Chest 2 View  08/30/2014   CLINICAL DATA:  Chest pain for 3 days  EXAM: CHEST - 2 VIEW  COMPARISON:  01/12/2013  FINDINGS: The heart size and mediastinal contours are within normal limits. Both lungs are clear. The visualized skeletal structures are unremarkable.  IMPRESSION: No active disease.   Electronically Signed   By: Inez Catalina M.D.   On: 08/30/2014 15:48   Ct Angio Neck W/cm &/or Wo/cm  08/31/2014   CLINICAL DATA:  TIA, slurred speech.  Diabetes.  EXAM: CT ANGIOGRAPHY HEAD AND NECK  TECHNIQUE: Multidetector CT imaging of the head and neck was performed using the standard protocol during bolus administration of intravenous contrast. Multiplanar CT image reconstructions and MIPs were obtained to evaluate the vascular anatomy. Carotid stenosis measurements (when applicable) are obtained utilizing NASCET criteria, using the distal internal carotid diameter  as the denominator.  CONTRAST:  68mL OMNIPAQUE IOHEXOL 350 MG/ML SOLN  COMPARISON:  MRI head 08/30/2014  FINDINGS: CT HEAD  Brain: Ventricle size normal. Cerebral volume normal. Negative for acute or chronic ischemia. Negative for hemorrhage or mass.  Calvarium and skull base: Negative  Paranasal sinuses: Negative  Orbits: Negative  CTA NECK  Aortic arch: Normal aortic arch without aneurysm or dissection. Mild atherosclerotic disease in the proximal great vessels without significant stenosis. Lung apices are clear.  Right carotid system: Right carotid artery widely patent without significant stenosis or dissection. Mild atherosclerotic disease in the carotid bulb.  Left carotid system: Left carotid widely patent without stenosis or dissection. Mild atherosclerotic plaque in the carotid bulb.  Vertebral arteries:Left vertebral dominant. Moderately severe calcific stenosis at the origin of the left vertebral artery. Right vertebral artery is patent to PICA without significant contribution to the basilar. Moderate calcific stenosis distal left vertebral artery and distal right vertebral artery.  Skeleton: Disc degeneration and spondylosis in the cervical spine. No acute bony abnormality.  Other neck: Negative for mass or adenopathy  CTA HEAD  Anterior circulation: Cavernous carotid widely patent bilaterally without stenosis. Anterior and middle cerebral arteries widely patent bilaterally.  Posterior circulation: Moderately severe calcific stenosis distal left vertebral artery. Moderately severe calcific stenosis distal right vertebral artery. Right vertebral artery ends in PICA. Left PICA also patent. Basilar patent. Superior cerebellar and posterior cerebral arteries patent bilaterally without stenosis.  Venous sinuses: Patent  Anatomic variants: Negative for cerebral aneurysm  Delayed phase: Normal enhancement of the brain  IMPRESSION: No acute intracranial abnormality.  Very mild atherosclerotic disease in the  carotid bulb bilaterally without significant carotid stenosis.  Moderate calcific stenosis proximal left vertebral artery and moderate calcific stenosis distal vertebral artery bilaterally. Otherwise no significant intracranial stenosis.   Electronically Signed   By: Franchot [name] M.D.   On: 08/31/2014 09:12   Mr Brain Wo Contrast  08/30/2014   CLINICAL DATA:  Slurred speech.  Weakness.  Headache for 2 days.  EXAM: MRI HEAD WITHOUT CONTRAST  TECHNIQUE: Multiplanar, multiecho pulse sequences of the brain and surrounding structures were obtained without intravenous contrast.  COMPARISON:  None.  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  No acute infarct, hemorrhage, or mass lesion is present. There is no significant white matter disease. Ventricles are of normal size. No significant extraaxial fluid collection is present.  Flow is present in the major intracranial arteries. The globes and orbits are intact. The paranasal sinuses are clear. There is some fluid in the mastoid air cells bilaterally. No obstructing nasopharyngeal lesion is present.  The skullbase is within normal limits. Midline structures demonstrate mild degenerative change at C3-4. Intracranial midline structures are  unremarkable.  IMPRESSION: 1. Normal MRI appearance of the brain for age. 2. 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 3. Degenerative changes in the upper cervical spine.   Electronically Signed   By: San Morelle M.D.   On: 08/30/2014 15:13   US Carotid Bilateral  08/31/2014   CLINICAL DATA:  TIA. Dizziness. History of hypertension, hyperlipidemia and diabetes. Former smoker.  EXAM: BILATERAL CAROTID DUPLEX ULTRASOUND  TECHNIQUE: Pearline Cables scale imaging, color Doppler and duplex ultrasound were performed of bilateral carotid and vertebral arteries in the neck.  COMPARISON:  None.  FINDINGS: Criteria: Quantification of carotid  stenosis is based on velocity parameters that correlate the residual internal carotid diameter with NASCET-based stenosis levels, using the diameter of the distal internal carotid lumen as the denominator for stenosis measurement.  The following velocity measurements were obtained:  RIGHT  ICA:  93/26 cm/sec  CCA:  0000000 cm/sec  SYSTOLIC ICA/CCA RATIO:  1.0  DIASTOLIC ICA/CCA RATIO:  1.2  ECA:  120 cm/sec  LEFT  ICA:  99/26 cm/sec  CCA:  123456 cm/sec  SYSTOLIC ICA/CCA RATIO:  [id number]  DIASTOLIC ICA/CCA RATIO:  1.3  ECA:  176 cm/sec  RIGHT CAROTID ARTERY: There is a very minimal amount of eccentric mixed echogenic plaque within the right carotid bulb (representative images 25-27), extending to involve the origin proximal aspect the right internal carotid artery (image 41), not resulting in elevated peak systolic velocities within the interrogated course of the right internal carotid artery to suggest a hemodynamically significant stenosis.  RIGHT VERTEBRAL ARTERY:  Antegrade flow  LEFT CAROTID ARTERY: There is a minimal amount of eccentric mixed echogenic plaque within the left carotid bulb (representative images 70 through 71), extending to involve the origin and proximal aspect the left internal carotid artery (image 88), not resulting in elevated peak systolic velocities within the interrogated course of the left internal carotid artery. Borderline elevated peak systolic velocity within distal aspect the left internal carotid artery is felt to be factitious Lee elevated due to sampling at a location of turbulent flow (representative image 96).  LEFT VERTEBRAL ARTERY:  Antegrade flow  IMPRESSION: Minimal amount of bilateral atherosclerotic plaque, left subjectively greater than right, not resulting in a hemodynamically significant stenosis.   Electronically Signed   By: Sandi Mariscal M.D.   On: 08/31/2014 14:25    Microbiology: No results found for this or any previous visit (from the past 240 hour(s)).    Labs: Basic Metabolic Panel:  Recent Labs Lab 08/30/14 1417 08/31/14 0653  NA 137 140  K 4.0 4.4  CL 103 108  CO2 23 25  GLUCOSE 185* 204*  BUN 19 17  CREATININE 1.32* 1.09  CALCIUM 9.3 8.9   Liver Function Tests:  Recent Labs Lab 08/30/14 1417  AST 32  ALT 28  ALKPHOS 99  BILITOT 0.4  PROT 7.5  ALBUMIN 4.2   No results for input(s): LIPASE, AMYLASE in the last 168 hours. No results for input(s): AMMONIA in the last 168 hours. CBC:  Recent Labs Lab 08/30/14 1417  WBC 7.9  HGB 14.1  HCT 41.7  MCV 78.2  PLT 171   Cardiac Enzymes:  Recent Labs Lab 08/30/14 1811 08/30/14 2317 08/31/14 0653  TROPONINI <0.03 <0.03 <0.03   BNP: BNP (last 3 results) No results for input(s): BNP in the last 8760 hours.  ProBNP (last 3 results) No results for input(s): PROBNP in the last 8760 hours.  CBG:  Recent Labs Lab 08/31/14 1608 08/31/14 2152 09/01/14 0722 09/01/14 1115 09/01/14 1655  GLUCAP 238* 152* 235* 228* 127*       Signed:  HERNANDEZ ACOSTA,ESTELA  [facility] Pager: [phone] 09/01/2014, 5:45 PM

## 2014-09-01 NOTE — Progress Notes (Signed)
EEG Completed; Results Pending  

## 2014-09-01 NOTE — Care Management Important Message (Signed)
Important Message  Patient Details  Name: [name] MRN: [id number] Date of Birth: 13-Aug-1960   Medicare Important Message Given:  Yes-second notification given    [name], RN 09/01/2014, 12:49 PM

## 2014-09-01 NOTE — Discharge Planning (Signed)
Pt being discharged home, vitals stable, no c/o pain, pt left with all belongings and discharge paperwork, no questions or concerns

## 2014-11-16 ENCOUNTER — Encounter: Payer: Self-pay | Admitting: Nutrition

## 2014-11-16 ENCOUNTER — Encounter: Payer: Medicare HMO | Attending: "Endocrinology | Admitting: Nutrition

## 2014-11-16 VITALS — Ht 78.0 in | Wt 282.0 lb

## 2014-11-16 DIAGNOSIS — E118 Type 2 diabetes mellitus with unspecified complications: Secondary | ICD-10-CM

## 2014-11-16 DIAGNOSIS — Z713 Dietary counseling and surveillance: Secondary | ICD-10-CM | POA: Insufficient documentation

## 2014-11-16 DIAGNOSIS — E669 Obesity, unspecified: Secondary | ICD-10-CM | POA: Diagnosis not present

## 2014-11-16 DIAGNOSIS — E1165 Type 2 diabetes mellitus with hyperglycemia: Secondary | ICD-10-CM | POA: Diagnosis not present

## 2014-11-16 DIAGNOSIS — IMO0002 Reserved for concepts with insufficient information to code with codable children: Secondary | ICD-10-CM

## 2014-11-16 DIAGNOSIS — Z794 Long term (current) use of insulin: Secondary | ICD-10-CM | POA: Diagnosis not present

## 2014-11-16 DIAGNOSIS — Z6832 Body mass index (BMI) 32.0-32.9, adult: Secondary | ICD-10-CM | POA: Diagnosis not present

## 2014-11-16 NOTE — Patient Instructions (Signed)
Goals: 1. Follow Plate Method 2. Increase low carb vegetables per meal 3. Increase walking as tolerated to 30 minutes daily. 4. Eat 45-60 grams of carbs per meal. 5. Get A1C down to 7% or below.

## 2014-11-16 NOTE — Progress Notes (Signed)
  Medical Nutrition Therapy:  Appt start time: 0800 end time:  0900.  Assessment:  Primary concerns today: DIabetes Type 2 Dm. Has had DM for 10 years. LIves with his wife. He does most the cooking and shopping. Most foods are grilled.. Test blood sugars 4 times per day. Levermir 50 units and 10 units of Novolog but hasnt had to use it since he started here because premeal BS are usually less than 150 mg/dl. BS bedtime avg 130 mg/dl.  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  Scheduled surgery for toe on the other big toe. Has to replace a staple in the big toe. Use to weigh 311 lbs 10 years ago. Had a light mini stroke in July 2016. PCP Sees Arsenio Katz, NP now.  FBS 116 and before lunch 80-90 mg/dl.  Physical activity: Mows, yard ward and house work. He hasn't been able to do 30 minutes of walking a day yet. Diet is balanced in carbs but could benefit from more fresh fruits and vegetables and whole grains.   Lab Results  Component Value Date   HGBA1C 8.7* 08/30/2014    Lab Results  Component Value Date   CHOL 148 08/31/2014   HDL 32* 08/31/2014   LDLCALC 88 08/31/2014   TRIG 139 08/31/2014   CHOLHDL 4.6 08/31/2014    Wt Readings from Last 3 Encounters:  08/30/14 280 lb 1.6 oz (127.053 kg)  05/02/14 290 lb (131.543 kg)  03/22/14 292 lb 12.8 oz (132.813 kg)   Ht Readings from Last 3 Encounters:  08/30/14 6\' 6"  (1.981 m)  05/02/14 6\' 6"  (1.981 m)  03/22/14 6\' 6"  (1.981 m)   Interventions: Nutrition and Diabetes education provided on My Plate, CHO counting, meal planning, portion sizes, timing of meals, avoiding snacks between meals unless having a low blood sugar, target ranges for A1C and blood sugars, signs/symptoms and treatment of hyper/hypoglycemia, monitoring blood sugars, taking medications as prescribed, benefits of exercising 30 minutes per day and prevention of complications of DM.  Preferred Learning Style:  Auditory  Visual  Hands  on  Learning Readiness:   Ready  Change in progress  Goals: 1. Follow Plate Method 2. Increase low carb vegetables per meal 3. Increase walking as tolerated to 30 minutes daily. 4. Eat 45-60 grams of carbs per meal. 5. Get A1C down to 7% or below.    MEDICATIONS: See list   DIETARY INTAKE:  24-hr recall:  B ( AM):  Eggs -2, Bacon 3 slices, grils, 1 pkt, Water  Snk ( AM): None  L ( PM): Sandwich [place] on white bread, water Snk ( PM): none D ( PM): Taco soup and grilled cheese sandwich, water Snk ( PM): None Beverages:   Usual physical activity: mow yard  Estimated energy needs: 2000 calories 225 g carbohydrates 150 g protein 56 g fat  Progress Towards Goal(s):  In progress.   Nutritional Diagnosis:  NB-1.1 Food and nutrition-related knowledge deficit As related to Diabets.  As evidenced by A1C 8.7%..   Teaching Method Utilized:  Visual Auditory Hands on  Handouts given during visit include:  The Plate Method  Meal Plan Card  Diabetes Instructions  Barriers to learning/adherence to lifestyle change: None  Demonstrated degree of understanding via:  Teach Back   Monitoring/Evaluation:  Dietary intake, exercise, meal planning, SBG, and body weight in 4 month(s).

## 2014-11-17 LAB — HEMOGLOBIN A1C: Hgb A1c MFr Bld: 7.4 % — AB (ref 4.0–6.0)

## 2014-11-23 ENCOUNTER — Other Ambulatory Visit: Payer: Self-pay | Admitting: Podiatry

## 2014-11-29 ENCOUNTER — Encounter: Payer: Self-pay | Admitting: "Endocrinology

## 2014-11-29 ENCOUNTER — Ambulatory Visit (INDEPENDENT_AMBULATORY_CARE_PROVIDER_SITE_OTHER): Payer: Medicare HMO | Admitting: "Endocrinology

## 2014-11-29 VITALS — BP 114/69 | HR 76 | Ht 78.0 in | Wt 280.0 lb

## 2014-11-29 DIAGNOSIS — E785 Hyperlipidemia, unspecified: Secondary | ICD-10-CM

## 2014-11-29 DIAGNOSIS — I10 Essential (primary) hypertension: Secondary | ICD-10-CM

## 2014-11-29 DIAGNOSIS — E1159 Type 2 diabetes mellitus with other circulatory complications: Secondary | ICD-10-CM

## 2014-11-29 NOTE — Patient Instructions (Addendum)

## 2014-11-29 NOTE — Progress Notes (Signed)
Subjective:    Patient ID: [name], male    DOB: 22-Jan-1961,    Past Medical History  Diagnosis Date  . Chest pain Sept. 2005    multiple caths  /  cath 06/15/2010..normal coronaries,  EF 60%   (false postive nuclear in the past)  . IDDM (insulin dependent diabetes mellitus) ([place])   . Hypertension   . Dyslipidemia     mixed  . Gastroparesis   . Pulmonary embolus (HCC)     Hx of small left lower lobe  pulmonary embolus  . Overweight(278.02)   . Peripheral vascular disease ([place])   . Headache(784.0)   . Neuropathy associated with endocrine disorder ([place])   . Pulmonary embolus ([place]) 2010ish  . Fibromyalgia   . Cervicalgia   . Urticaria, unspecified   . Allergic rhinitis, cause unspecified   . Lumbago   . 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 Esophageal reflux   . 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 Hyperlipidemia    Past Surgical History  Procedure Laterality Date  . Back surgery  1999    x3  . Shoulder arthroscopy w/ rotator cuff repair Right   . Achilles tendon repair Left 2013  . Appendectomy    . Lumbar laminectomy/decompression microdiscectomy Right 01/12/2013    Procedure: Right Lumbar Three-Four Laminotomy/Foraminotomy;  Surgeon: Floyce Stakes, MD;  Location: [facility];  Service: Neurosurgery;  Laterality: Right;  Right Lumbar Three-Four Laminotomy/Foraminotomy  . 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 Nissen fundoplication    . Amputation Left 07/21/2013    Procedure: PARTIAL AMPUTATION 2ND TOE LEFT FOOT;  Surgeon: Marcheta Grammes, DPM;  Location: [facility];  Service: Podiatry;  Laterality: Left;  . Foot arthrodesis Right 01/11/2014    Procedure: ARTHRODESIS  INTERPHALANGEAL JOINT HALLUX RIGHT FOOT;  Surgeon: Marcheta Grammes, DPM;  Location: [facility];  Service: Podiatry;  Laterality: Right;   Social History   Social History  . Marital Status: Married    Spouse Name: N/A  . Number of Children: 2  . Years of Education: 10th   Occupational History  . Retired- Research officer, trade union    Social History Main Topics  . Smoking status: Former Smoker -- 3.00 packs/day for 5 years    Types: Cigarettes    Quit date: 07/20/1982  . Smokeless tobacco: Never Used     Comment: quit 1980's  . Alcohol Use: No  . Drug Use: No  . Sexual Activity: Yes    Birth Control/ Protection: None   Other Topics Concern  . None   Social History Narrative   Outpatient Encounter Prescriptions as of 11/29/2014  Medication Sig  . aspirin EC 81 MG tablet Take 81 mg by mouth daily.  [name] atorvastatin (LIPITOR) 20 MG tablet Take 20 mg by mouth daily.  . canagliflozin (INVOKANA) 100 MG TABS tablet Take 100 mg by mouth.  . gabapentin (NEURONTIN) 600 MG tablet Take 2,400 mg by mouth at bedtime.  . insulin detemir (LEVEMIR) 100 UNIT/ML injection Inject 50 Units into the skin at bedtime.  . lidocaine (XYLOCAINE) 5 % ointment Apply 1 application topically daily as needed for mild pain or moderate pain.   . metFORMIN (GLUCOPHAGE) 500 MG tablet Take 1,000  mg by mouth 2 (two) times daily with a meal. 2 pills 2 times a day   . oxyCODONE-acetaminophen (PERCOCET) 5-325 MG tablet Take 1 tablet by mouth every 8 (eight) hours as needed for severe pain.  . [DISCONTINUED] insulin aspart (NOVOLOG) 100 UNIT/ML injection Inject 10-50 Units into the skin 3 (three) times daily before meals. Per sliding scale.   No facility-administered encounter medications on file as of 11/29/2014.   ALLERGIES: Allergies  Allergen Reactions  . Reglan [Metoclopramide] Itching and Rash   VACCINATION STATUS: Immunization History  Administered Date(s) Administered  . Influenza,inj,Quad PF,36+ Mos 01/13/2013     Diabetes He presents for his follow-up diabetic visit. He has type 2 diabetes mellitus. Onset time: He was diagnosed at approximate age of 28 years. His disease course has been improving. There are no hypoglycemic associated symptoms. Pertinent negatives for hypoglycemia include no confusion, pallor or seizures. There are no diabetic associated symptoms. Pertinent negatives for diabetes include no chest pain, no fatigue, no polydipsia, no polyphagia, no polyuria and no weakness. There are no hypoglycemic complications. Symptoms are improving. Diabetic complications include a CVA. Pertinent negatives for diabetic complications include no heart disease. Risk factors for coronary artery disease include diabetes mellitus, dyslipidemia, male sex, obesity and sedentary lifestyle. Current diabetic treatment includes insulin injections and oral agent (dual therapy). He is compliant with treatment most of the time. His weight is stable. He is following a diabetic diet. He has had a previous visit with a dietitian. He rarely participates in exercise. His home blood glucose trend is decreasing steadily. His overall blood glucose range is 130-140 mg/dl. An ACE inhibitor/angiotensin II receptor blocker is not being taken. He sees a podiatrist.Eye exam is current.  Hyperlipidemia This is a chronic problem. The current episode started more than 1 year ago. Pertinent negatives include no chest pain, leg pain or myalgias. Current antihyperlipidemic treatment includes statins. Risk factors for coronary artery disease include diabetes mellitus, dyslipidemia, male sex, a sedentary lifestyle and obesity.     Review of Systems  Constitutional: Negative for fatigue and unexpected weight change.  HENT: Negative for dental problem, mouth sores and trouble swallowing.   Eyes: Negative for visual disturbance.  Respiratory: Negative for cough, choking, chest tightness and wheezing.   Cardiovascular: Negative for chest pain  and leg swelling.  Gastrointestinal: Negative for nausea, vomiting, abdominal pain, diarrhea, constipation and abdominal distention.  Endocrine: Negative for polydipsia, polyphagia and polyuria.  Genitourinary: Negative for dysuria, urgency, hematuria and flank pain.  Musculoskeletal: Negative for myalgias, back pain and gait problem.  Skin: Negative for pallor, rash and wound.  Neurological: Negative for seizures, syncope, weakness and numbness.  Psychiatric/Behavioral: Negative.  Negative for confusion and dysphoric mood.    Objective:    BP 114/69 mmHg  Pulse 76  Ht 6\' 6"  (1.981 m)  Wt 280 lb (127.007 kg)  BMI 32.36 kg/m2  SpO2 97%  Wt Readings from Last 3 Encounters:  11/29/14 280 lb (127.007 kg)  11/16/14 282 lb (127.914 kg)  08/30/14 280 lb 1.6 oz (127.053 kg)    Physical Exam  Constitutional: He is oriented to person, place, and time. He appears well-developed and well-nourished. He is cooperative. No distress.  HENT:  Head: Normocephalic and atraumatic.  Eyes: EOM are normal.  Neck: Normal range of motion. Neck supple. No tracheal deviation present. No thyromegaly present.  Cardiovascular: Normal rate, S1 normal, S2 normal and normal heart sounds.  Exam reveals no gallop.   No murmur heard. Pulses:  Dorsalis pedis pulses are 1+ on the right side, and 1+ on the left side.       Posterior tibial pulses are 1+ on the right side, and 1+ on the left side.  Pulmonary/Chest: Breath sounds normal. No respiratory distress. He has no wheezes.  Abdominal: Soft. Bowel sounds are normal. He exhibits no distension. There is no tenderness. There is no guarding and no CVA tenderness.  Musculoskeletal: He exhibits no edema.       Right shoulder: He exhibits no swelling and no deformity.  Neurological: He is alert and oriented to person, place, and time. He has normal strength and normal reflexes. No cranial nerve deficit or sensory deficit. Gait normal.  Skin: Skin is warm and dry.  No rash noted. No cyanosis. Nails show no clubbing.  Psychiatric: He has a normal mood and affect. His speech is normal and behavior is normal. Judgment and thought content normal. Cognition and memory are normal.    Results for orders placed or performed during the hospital encounter of 08/30/14  Comprehensive metabolic panel  Result Value Ref Range   Sodium 137 135 - 145 mmol/L   Potassium 4.0 3.5 - 5.1 mmol/L   Chloride 103 101 - 111 mmol/L   CO2 23 22 - 32 mmol/L   Glucose, Bld 185 (H) 65 - 99 mg/dL   BUN 19 6 - 20 mg/dL   Creatinine, Ser 1.32 (H) 0.61 - 1.24 mg/dL   Calcium 9.3 8.9 - 10.3 mg/dL   Total Protein 7.5 6.5 - 8.1 g/dL   Albumin 4.2 3.5 - 5.0 g/dL   AST 32 15 - 41 U/L   ALT 28 17 - 63 U/L   Alkaline Phosphatase 99 38 - 126 U/L   Total Bilirubin 0.4 0.3 - 1.2 mg/dL   GFR calc non Af Amer 59 (L) >60 mL/min   GFR calc Af Amer >60 >60 mL/min   Anion gap 11 5 - 15  CBC  Result Value Ref Range   WBC 7.9 4.0 - 10.5 K/uL   RBC 5.33 4.22 - 5.81 MIL/uL   Hemoglobin 14.1 13.0 - 17.0 g/dL   HCT 41.7 39.0 - 52.0 %   MCV 78.2 78.0 - 100.0 fL   MCH 26.5 26.0 - 34.0 pg   MCHC 33.8 30.0 - 36.0 g/dL   RDW 15.2 11.5 - 15.5 %   Platelets 171 150 - 400 K/uL  Urinalysis, Routine w reflex microscopic (not at [facility])  Result Value Ref Range   Color, Urine YELLOW YELLOW   APPearance CLEAR CLEAR   Specific Gravity, Urine 1.025 1.005 - 1.030   pH 5.5 5.0 - 8.0   Glucose, UA >1000 (A) NEGATIVE mg/dL   Hgb urine dipstick NEGATIVE NEGATIVE   Bilirubin Urine NEGATIVE NEGATIVE   Ketones, ur NEGATIVE NEGATIVE mg/dL   Protein, ur NEGATIVE NEGATIVE mg/dL   Urobilinogen, UA 0.2 0.0 - 1.0 mg/dL   Nitrite NEGATIVE NEGATIVE   Leukocytes, UA NEGATIVE NEGATIVE  Urine microscopic-add on  Result Value Ref Range   WBC, UA 0-2 <3 WBC/hpf   RBC / HPF 0-2 <3 RBC/hpf  Hemoglobin A1c  Result Value Ref Range   Hgb A1c MFr Bld 8.7 (H) 4.8 - 5.6 %   Mean Plasma Glucose 203 mg/dL  Lipid panel   Result Value Ref Range   Cholesterol 148 0 - 200 mg/dL   Triglycerides 139 <150 mg/dL   HDL 32 (L) >40 mg/dL   Total CHOL/HDL Ratio 4.6 RATIO  VLDL 28 0 - 40 mg/dL   LDL Cholesterol 88 0 - 99 mg/dL  Basic metabolic panel  Result Value Ref Range   Sodium 140 135 - 145 mmol/L   Potassium 4.4 3.5 - 5.1 mmol/L   Chloride 108 101 - 111 mmol/L   CO2 25 22 - 32 mmol/L   Glucose, Bld 204 (H) 65 - 99 mg/dL   BUN 17 6 - 20 mg/dL   Creatinine, Ser 1.09 0.61 - 1.24 mg/dL   Calcium 8.9 8.9 - 10.3 mg/dL   GFR calc non Af Amer >60 >60 mL/min   GFR calc Af Amer >60 >60 mL/min   Anion gap 7 5 - 15  Troponin I (q 6hr x 3)  Result Value Ref Range   Troponin I <0.03 <0.031 ng/mL  Troponin I (q 6hr x 3)  Result Value Ref Range   Troponin I <0.03 <0.031 ng/mL  Troponin I (q 6hr x 3)  Result Value Ref Range   Troponin I <0.03 <0.031 ng/mL  D-dimer, quantitative (not at [facility])  Result Value Ref Range   D-Dimer, Quant 0.76 (H) 0.00 - 0.48 ug/mL-FEU  Glucose, capillary  Result Value Ref Range   Glucose-Capillary 132 (H) 65 - 99 mg/dL  Glucose, capillary  Result Value Ref Range   Glucose-Capillary 138 (H) 65 - 99 mg/dL   Comment 1 Notify RN    Comment 2 Document in Chart   Glucose, capillary  Result Value Ref Range   Glucose-Capillary 190 (H) 65 - 99 mg/dL  Glucose, capillary  Result Value Ref Range   Glucose-Capillary 187 (H) 65 - 99 mg/dL  Glucose, capillary  Result Value Ref Range   Glucose-Capillary 238 (H) 65 - 99 mg/dL  Glucose, capillary  Result Value Ref Range   Glucose-Capillary 152 (H) 65 - 99 mg/dL   Comment 1 Notify RN    Comment 2 Document in Chart   Glucose, capillary  Result Value Ref Range   Glucose-Capillary 235 (H) 65 - 99 mg/dL  Glucose, capillary  Result Value Ref Range   Glucose-Capillary 228 (H) 65 - 99 mg/dL  Glucose, capillary  Result Value Ref Range   Glucose-Capillary 127 (H) 65 - 99 mg/dL   Comment 1 Notify RN    Comment 2 Document in Chart   I-stat  troponin, ED (not at [facility])  Result Value Ref Range   Troponin i, poc 0.00 0.00 - 0.08 ng/mL   Comment 3           Complete Blood Count (Most recent): Lab Results  Component Value Date   WBC 7.9 08/30/2014   HGB 14.1 08/30/2014   HCT 41.7 08/30/2014   MCV 78.2 08/30/2014   PLT 171 08/30/2014   Chemistry (most recent): Lab Results  Component Value Date   NA 140 08/31/2014   K 4.4 08/31/2014   CL 108 08/31/2014   CO2 25 08/31/2014   BUN 17 08/31/2014   CREATININE 1.09 08/31/2014   Diabetic Labs (most recent): Lab Results  Component Value Date   HGBA1C 8.7* 08/30/2014   HGBA1C * 05/30/2007    7.6 (NOTE)   The ADA recommends the following therapeutic goals for glycemic   control related to Hgb A1C measurement:   Goal of Therapy:   < 7.0% Hgb A1C   Action Suggested:  > 8.0% Hgb A1C   Ref:  Diabetes Care, 22, Suppl. 1, 1999   Lipid profile (most recent): Lab Results  Component Value Date   TRIG  139 08/31/2014   CHOL 148 08/31/2014         Assessment & Plan:   1. Type 2 diabetes mellitus with vascular disease (HCC)  His diabetes is  complicated by CVA and patient remains at a high risk for more acute and chronic complications of diabetes which include CAD, CVA, CKD, retinopathy, and neuropathy. These are all discussed in detail with the patient.  Patient came with significantly improved glucose profile, and  recent A1c of 7.4 %, improving from 8.5%.  Glucose logs and insulin administration records pertaining to this visit,  to be scanned into patient's records.  Recent labs reviewed. He did not use NovoLog when blood glucose pre-meal was between 90 and 150 mg/dl.   - I have re-counseled the patient on diet management and weight loss  by adopting a carbohydrate restricted / protein rich  Diet.  - Suggestion is made for patient to avoid simple carbohydrates   from their diet including Cakes , Desserts, Ice Cream,  Soda (  diet and regular) , Sweet Tea , Candies,   Chips, Cookies, Artificial Sweeteners,   and "Sugar-free" Products .  This will help patient to have stable blood glucose profile and potentially avoid unintended  Weight gain.  - Patient is advised to stick to a routine mealtimes to eat 3 meals  a day and avoid unnecessary snacks ( to snack only to correct hypoglycemia).  - The patient  has been  scheduled with Jearld Fenton, RDN, CDE for individualized DM education.  - I have approached patient with the following individualized plan to manage diabetes and patient agrees.  -I will continue basal insulin Levemir  50 units QHS, and hold Novolog for now. -He will continue to monitor blood glucose every morning before breakfast. -Patient is warned not to take insulin without proper monitoring per orders.  -Patient is encouraged to call clinic for blood glucose levels less than 70 or above 300 mg /dl.  -For better insulin sensitivity I will continue MTF 1gm po BID, and Invokana 100mg  po qam.  - Patient will be considered for incretin therapy as appropriate next visit. - Patient specific target  for A1c; LDL, HDL, Triglycerides, and  Waist Circumference were discussed in detail.  2) BP/HTN: Controlled, no medications currently. 3) Lipids/HPL:  continue statins. 4)  Weight/Diet: CDE consult in progress, exercise, and carbohydrates information provided.  5) Chronic Care/Health Maintenance:  -Patient is on Statin medications and encouraged to continue to follow up with Ophthalmology, Podiatrist at least yearly or according to recommendations, and advised to  stay away from smoking. I have recommended yearly flu vaccine and pneumonia vaccination at least every 5 years; moderate intensity exercise for up to 150 minutes weekly; and  sleep for at least 7 hours a day.  I advised patient to maintain close follow up with their PCP for primary care needs.  Patient is asked to bring meter and  blood glucose logs during their next visit.   Follow up  plan: Return in about 3 months (around 03/01/2015) for diabetes, high cholesterol.  Glade Lloyd, MD Phone: [phone]  Fax: [phone]   11/29/2014, 9:56 AM

## 2014-12-08 NOTE — Addendum Note (Signed)
Addended by: Caprice Beaver on: 12/08/2014 04:06 PM   Modules accepted: Orders

## 2014-12-18 ENCOUNTER — Encounter (HOSPITAL_COMMUNITY): Payer: Self-pay | Admitting: *Deleted

## 2014-12-18 ENCOUNTER — Emergency Department (HOSPITAL_COMMUNITY): Payer: Medicare HMO

## 2014-12-18 ENCOUNTER — Emergency Department (HOSPITAL_COMMUNITY)
Admission: EM | Admit: 2014-12-18 | Discharge: 2014-12-18 | Disposition: A | Discharge status: Home / Self Care | Payer: Medicare HMO | Attending: Emergency Medicine | Admitting: Emergency Medicine

## 2014-12-18 DIAGNOSIS — E1149 Type 2 diabetes mellitus with other diabetic neurological complication: Secondary | ICD-10-CM | POA: Diagnosis not present

## 2014-12-18 DIAGNOSIS — Z8719 Personal history of other diseases of the digestive system: Secondary | ICD-10-CM | POA: Insufficient documentation

## 2014-12-18 DIAGNOSIS — G629 Polyneuropathy, unspecified: Secondary | ICD-10-CM | POA: Insufficient documentation

## 2014-12-18 DIAGNOSIS — Z7984 Long term (current) use of oral hypoglycemic drugs: Secondary | ICD-10-CM | POA: Insufficient documentation

## 2014-12-18 DIAGNOSIS — Z79899 Other long term (current) drug therapy: Secondary | ICD-10-CM | POA: Insufficient documentation

## 2014-12-18 DIAGNOSIS — Z872 Personal history of diseases of the skin and subcutaneous tissue: Secondary | ICD-10-CM | POA: Insufficient documentation

## 2014-12-18 DIAGNOSIS — Z87891 Personal history of nicotine dependence: Secondary | ICD-10-CM | POA: Diagnosis not present

## 2014-12-18 DIAGNOSIS — I10 Essential (primary) hypertension: Secondary | ICD-10-CM | POA: Insufficient documentation

## 2014-12-18 DIAGNOSIS — E785 Hyperlipidemia, unspecified: Secondary | ICD-10-CM | POA: Insufficient documentation

## 2014-12-18 DIAGNOSIS — I739 Peripheral vascular disease, unspecified: Secondary | ICD-10-CM | POA: Diagnosis not present

## 2014-12-18 DIAGNOSIS — M797 Fibromyalgia: Secondary | ICD-10-CM | POA: Diagnosis not present

## 2014-12-18 DIAGNOSIS — Z7982 Long term (current) use of aspirin: Secondary | ICD-10-CM | POA: Diagnosis not present

## 2014-12-18 DIAGNOSIS — Z794 Long term (current) use of insulin: Secondary | ICD-10-CM | POA: Insufficient documentation

## 2014-12-18 DIAGNOSIS — R079 Chest pain, unspecified: Secondary | ICD-10-CM | POA: Insufficient documentation

## 2014-12-18 DIAGNOSIS — E663 Overweight: Secondary | ICD-10-CM | POA: Insufficient documentation

## 2014-12-18 DIAGNOSIS — Z86711 Personal history of pulmonary embolism: Secondary | ICD-10-CM | POA: Insufficient documentation

## 2014-12-18 DIAGNOSIS — Z8673 Personal history of transient ischemic attack (TIA), and cerebral infarction without residual deficits: Secondary | ICD-10-CM | POA: Insufficient documentation

## 2014-12-18 HISTORY — DX: Cerebral infarction, unspecified: I63.9

## 2014-12-18 LAB — BASIC METABOLIC PANEL
ANION GAP: 10 (ref 5–15)
BUN: 27 mg/dL — ABNORMAL HIGH (ref 6–20)
CHLORIDE: 103 mmol/L (ref 101–111)
CO2: 24 mmol/L (ref 22–32)
Calcium: 9.9 mg/dL (ref 8.9–10.3)
Creatinine, Ser: 1.41 mg/dL — ABNORMAL HIGH (ref 0.61–1.24)
GFR calc Af Amer: >60 mL/min (ref >60)
GFR, EST NON AFRICAN AMERICAN: 55 mL/min — AB (ref >60)
Glucose, Bld: 150 mg/dL — ABNORMAL HIGH (ref 65–99)
POTASSIUM: 4.2 mmol/L (ref 3.5–5.1)
SODIUM: 137 mmol/L (ref 135–145)

## 2014-12-18 LAB — CBC
HEMATOCRIT: 43.9 % (ref 39.0–52.0)
HEMOGLOBIN: 14.6 g/dL (ref 13.0–17.0)
MCH: 27.1 pg (ref 26.0–34.0)
MCHC: 33.3 g/dL (ref 30.0–36.0)
MCV: 81.4 fL (ref 78.0–100.0)
Platelets: 168 10*3/uL (ref 150–400)
RBC: 5.39 MIL/uL (ref 4.22–5.81)
RDW: 14.1 % (ref 11.5–15.5)
WBC: 7.5 10*3/uL (ref 4.0–10.5)

## 2014-12-18 LAB — TROPONIN I: Troponin I: <0.03 ng/mL (ref <0.031)

## 2014-12-18 NOTE — Discharge Instructions (Signed)
[name] will call tomorrow for an appointment. Return if worse.

## 2014-12-18 NOTE — ED Provider Notes (Addendum)
CSN: [id number]     Arrival date & time 12/18/14  1700 History   First MD Initiated Contact with Patient 12/18/14 1904     Chief Complaint  Patient presents with  . Chest Pain     (Consider location/radiation/quality/duration/timing/severity/associated sxs/prior Treatment) HPI..... Central chest pain for 3 days with associated numbness in both hands. No dyspnea, diaphoresis, nausea. No previous MI. Status post TIA in August 2016. Risk factors include diabetes, lipids, hypertension. Severity of pain is mild. Nothing makes symptoms better or worse. Cardiac catheterization May 2012 showed normal coronaries.  Past Medical History  Diagnosis Date  . Chest pain Sept. 2005    multiple caths  /  cath 06/15/2010..normal coronaries,  EF 60%   (false postive nuclear in the past)  . IDDM (insulin dependent diabetes mellitus) ([place])   . Hypertension   . Dyslipidemia     mixed  . Gastroparesis   . Pulmonary embolus (HCC)     Hx of small left lower lobe  pulmonary embolus  . Overweight(278.02)   . Peripheral vascular disease ([place])   . Headache(784.0)   . Neuropathy associated with endocrine disorder ([place])   . Pulmonary embolus ([place]) 2010ish  . Fibromyalgia   . Cervicalgia   . Urticaria, unspecified   . Allergic rhinitis, cause unspecified   . Lumbago   . 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 Esophageal reflux   . 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 Hyperlipidemia   . Stroke [facility])     mini stroke aug 2016   Past Surgical History  Procedure Laterality Date  . Back surgery  1999    x3  . Shoulder arthroscopy w/ rotator cuff repair Right   . Achilles tendon repair Left 2013  . Appendectomy    . Lumbar laminectomy/decompression microdiscectomy Right 01/12/2013    Procedure: Right Lumbar Three-Four Laminotomy/Foraminotomy;  Surgeon: Floyce Stakes, MD;  Location: [facility];  Service: Neurosurgery;  Laterality: Right;  Right Lumbar Three-Four Laminotomy/Foraminotomy  . 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 Nissen fundoplication    . Amputation Left 07/21/2013    Procedure: PARTIAL AMPUTATION 2ND TOE LEFT FOOT;  Surgeon: Marcheta Grammes, DPM;  Location: [facility];  Service: Podiatry;  Laterality: Left;  . Foot arthrodesis Right 01/11/2014    Procedure: ARTHRODESIS INTERPHALANGEAL JOINT HALLUX RIGHT FOOT;  Surgeon: Marcheta Grammes, DPM;  Location: [facility];  Service: Podiatry;  Laterality: Right;   Family History  Problem Relation Age of Onset  . Heart attack Father 21  . Hyperlipidemia Father   . Hypertension Father   . Heart attack Mother 4  . Diabetes Mother   . Hypertension Mother   . Hyperlipidemia Mother   . Healthy Brother   . Seizures Brother   . Migraines Neg Hx    Social History  Substance Use Topics  . Smoking status: Former Smoker -- 3.00 packs/day for 5 years    Types: Cigarettes    Quit date: 07/20/1982  . Smokeless tobacco: Never Used     Comment: quit 1980's  . Alcohol Use: No    Review of Systems  All other systems reviewed and are negative.     Allergies  Reglan  Home Medications   Prior  to Admission medications   Medication Sig Start Date End Date Taking? Authorizing Provider  aspirin EC 81 MG tablet Take 81 mg by mouth daily.   Yes Historical Provider, MD  atorvastatin (LIPITOR) 20 MG tablet Take 20 mg by mouth daily.   Yes Historical Provider, MD  canagliflozin (INVOKANA) 100 MG TABS tablet Take 100 mg by mouth daily.    Yes Historical Provider, MD  gabapentin (NEURONTIN) 600 MG tablet Take 2,400 mg by mouth at bedtime.   Yes Historical Provider, MD  insulin detemir (LEVEMIR) 100 UNIT/ML injection Inject 50 Units into the skin at bedtime.    Yes Historical Provider, MD  metFORMIN (GLUCOPHAGE) 500 MG tablet Take 1,000 mg by mouth 2 (two) times daily with a meal. 2 pills 2 times a day    Yes Historical Provider, MD  oxyCODONE-acetaminophen (PERCOCET) 5-325 MG tablet Take 1 tablet by mouth every 8 (eight) hours as needed for severe pain.   Yes Historical Provider, MD   BP 140/83 mmHg  Pulse 56  Temp(Src) 97.9 F (36.6 C) (Oral)  Resp 28  Ht 6\' 6"  (1.981 m)  Wt 277 lb (125.646 kg)  BMI 32.02 kg/m2  SpO2 98% Physical Exam  Constitutional: He is oriented to person, place, and time. He appears well-developed and well-nourished.  HENT:  Head: Normocephalic and atraumatic.  Eyes: Conjunctivae and EOM are normal. Pupils are equal, round, and reactive to light.  Neck: Normal range of motion. Neck supple.  Cardiovascular: Normal rate and regular rhythm.   Pulmonary/Chest: Effort normal and breath sounds normal.  Abdominal: Soft. Bowel sounds are normal.  Musculoskeletal: Normal range of motion.  Neurological: He is alert and oriented to person, place, and time.  Skin: Skin is warm and dry.  Psychiatric: He has a normal mood and affect. His behavior is normal.  Nursing note and vitals reviewed.   ED Course  Procedures (including critical care time) Labs Review Labs Reviewed  BASIC METABOLIC PANEL - Abnormal; Notable for the following:    Glucose, Bld 150 (*)    BUN 27 (*)    Creatinine, Ser 1.41 (*)    GFR calc non Af Amer 55 (*)    All other components within normal limits  CBC  TROPONIN I    Imaging Review Dg Chest 2 View  12/18/2014  CLINICAL DATA:  Three day history of chest pain. EXAM: CHEST  2 VIEW COMPARISON:  08/30/2014 FINDINGS: The cardiac silhouette, mediastinal hilar contours are within normal limits and stable. There is mild tortuosity of the thoracic aorta. Mild chronic bronchitic type lung changes but no definite acute overlying pulmonary process. The bony thorax is intact. IMPRESSION: Mild chronic  bronchitic changes but no definite infiltrates, effusions or edema. Electronically Signed   By: Marijo Sanes M.D.   On: 12/18/2014 17:20   I have personally reviewed and evaluated these images and lab results as part of my medical decision-making.   EKG Interpretation   Date/Time:  Monday December 18 2014 17:04:46 EST Ventricular Rate:  68 PR Interval:  204 QRS Duration: 74 QT Interval:  360 QTC Calculation: 382 R Axis:   16 Text Interpretation:  Normal sinus rhythm Normal ECG Confirmed by Daxten Kovalenko   MD, Jamerius Boeckman ([id number]) on 12/18/2014 9:14:46 PM      MDM   Final diagnoses:  Chest pain, unspecified chest pain type    Patient is hemodynamically stable. EKG and troponin negative for acute findings. Discussed with cardiology. He will follow-up this week for stress test.  Nat Christen, MD 12/18/14 [id number]  Nat Christen, MD 12/18/14 [id number]  Nat Christen, MD 12/18/14 [id number]

## 2014-12-18 NOTE — ED Notes (Signed)
Patient reports chest pain that started 3 days ago. Also reports bilateral arm numbness that started a week ago. Patient alert, no neuor deficits noted in triage.

## 2014-12-19 ENCOUNTER — Telehealth (HOSPITAL_COMMUNITY): Payer: Self-pay | Admitting: Cardiology

## 2014-12-25 ENCOUNTER — Other Ambulatory Visit: Payer: Self-pay

## 2014-12-25 MED ORDER — CANAGLIFLOZIN 100 MG PO TABS: 100.0000 mg | ORAL_TABLET | Freq: Every day | ORAL | Status: DC

## 2015-01-04 ENCOUNTER — Encounter: Payer: Self-pay | Admitting: *Deleted

## 2015-01-04 ENCOUNTER — Ambulatory Visit (INDEPENDENT_AMBULATORY_CARE_PROVIDER_SITE_OTHER): Payer: Medicare HMO | Admitting: Cardiovascular Disease

## 2015-01-04 ENCOUNTER — Encounter: Payer: Self-pay | Admitting: Cardiovascular Disease

## 2015-01-04 VITALS — BP 128/80 | HR 81 | Ht 78.0 in | Wt 279.0 lb

## 2015-01-04 DIAGNOSIS — R079 Chest pain, unspecified: Secondary | ICD-10-CM | POA: Diagnosis not present

## 2015-01-04 DIAGNOSIS — E785 Hyperlipidemia, unspecified: Secondary | ICD-10-CM

## 2015-01-04 DIAGNOSIS — Z87898 Personal history of other specified conditions: Secondary | ICD-10-CM

## 2015-01-04 DIAGNOSIS — Z9289 Personal history of other medical treatment: Secondary | ICD-10-CM

## 2015-01-04 MED ORDER — RANOLAZINE ER 500 MG PO TB12: 500.0000 mg | ORAL_TABLET | Freq: Two times a day (BID) | ORAL | Status: DC

## 2015-01-04 NOTE — Patient Instructions (Addendum)
   Begin Ranexa 500mg  twice a day  - new sent to [place] today.  Continue all other medications.   Your physician has requested that you have a lexiscan myoview. For further information please visit [email or web address]. Please follow instruction sheet, as given. Office will contact with results via phone or letter.   Follow up in  2 months

## 2015-01-04 NOTE — Progress Notes (Signed)
Patient ID: [name], male   DOB: [birth year], 54 y.o.   MRN: [id number]       CARDIOLOGY CONSULT NOTE  Patient ID: [name] MRN: [id number] DOB/AGE: 03-21-1960 54 y.o.  Admit date: (Not on file) Primary Physician Monico Blitz, MD  Reason for Consultation: Chest pain  HPI: The patient is a 54 year old male with a history of chronic chest pain deemed noncardiac in etiology, insulin-dependent diabetes mellitus, and hyperlipidemia. He underwent coronary angiography on 07/02/10 which demonstrated no significant epicardial coronary disease. There is a 40% obtuse marginal branch lesion as detailed below.  He was evaluated for retrosternal chest pain in the ED on 12/18/14. Troponin and CBC were normal. BUN 27, creatinine 1.41. Chest x-ray showed mild bronchitic changes which were chronic but no infiltrates, effusions or edema. ECG showed normal sinus rhythm with no ischemic ST segment or T-wave abnormalities, nor any arrhythmias.  Echocardiogram on 08/31/14 demonstrated normal left ventricular systolic function and moderate LVH, EF 60-65%. Diastolic function was normal.  He has been having retrosternal chest pain for the past 2 months. It is not associated with exertion and occurs spontaneously. It may last all day. It is not made worse with deep inspiration. He denies GERD and previously underwent Nissen fundoplication. Denies leg swelling and orthopnea.    Allergies  Allergen Reactions  . Reglan [Metoclopramide] Itching and Rash    Current Outpatient Prescriptions  Medication Sig Dispense Refill  . aspirin EC 81 MG tablet Take 81 mg by mouth daily.    [name] atorvastatin (LIPITOR) 20 MG tablet Take 20 mg by mouth daily.    . canagliflozin (INVOKANA) 100 MG TABS tablet Take 1 tablet (100 mg total) by mouth daily. 30 tablet 2  . gabapentin (NEURONTIN) 600 MG tablet Take 2,400 mg by mouth at bedtime.    . insulin detemir (LEVEMIR) 100 UNIT/ML injection Inject 50 Units into the  skin at bedtime.    . metFORMIN (GLUCOPHAGE) 500 MG tablet Take 1,000 mg by mouth 2 (two) times daily with a meal. 2 pills 2 times a day     . oxyCODONE-acetaminophen (PERCOCET) 5-325 MG tablet Take 1 tablet by mouth every 8 (eight) hours as needed for severe pain.     No current facility-administered medications for this visit.    Past Medical History  Diagnosis Date  . Chest pain Sept. 2005    multiple caths  /  cath 06/15/2010..normal coronaries,  EF 60%   (false postive nuclear in the past)  . IDDM (insulin dependent diabetes mellitus) ([place])   . Hypertension   . Dyslipidemia     mixed  . Gastroparesis   . Pulmonary embolus ([facility])     Hx of small left lower lobe  pulmonary embolus  . Overweight(278.02)   . Peripheral vascular disease ([place])   . Headache(784.0)   . Neuropathy associated with endocrine disorder ([place])   . Pulmonary embolus ([place]) 2010ish  . Fibromyalgia   . Cervicalgia   . Urticaria, unspecified   . Allergic rhinitis, cause unspecified   . Lumbago   . 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 Esophageal reflux   . 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 Hyperlipidemia   . Stroke [facility])     mini stroke aug 2016    Past Surgical History  Procedure Laterality Date  . Back surgery  1999    x3  .  Shoulder arthroscopy w/ rotator cuff repair Right   . Achilles tendon repair Left 2013  . Appendectomy    . Lumbar laminectomy/decompression microdiscectomy Right 01/12/2013    Procedure: Right Lumbar Three-Four Laminotomy/Foraminotomy;  Surgeon: Floyce Stakes, MD;  Location: [facility];  Service: Neurosurgery;  Laterality: Right;  Right Lumbar Three-Four Laminotomy/Foraminotomy  . 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 Nissen fundoplication    . Amputation Left 07/21/2013    Procedure: PARTIAL AMPUTATION 2ND TOE LEFT FOOT;  Surgeon: Marcheta Grammes, DPM;  Location: [facility];  Service: Podiatry;  Laterality: Left;  . Foot arthrodesis Right 01/11/2014    Procedure: ARTHRODESIS INTERPHALANGEAL JOINT HALLUX RIGHT FOOT;  Surgeon: Marcheta Grammes, DPM;  Location: [facility];  Service: Podiatry;  Laterality: Right;    Social History   Social History  . Marital Status: Married    Spouse Name: N/A  . Number of Children: 2  . Years of Education: 10th   Occupational History  . Retired- Research officer, trade union    Social History Main Topics  . Smoking status: Former Smoker -- 3.00 packs/day for 5 years    Types: Cigarettes    Start date: 06/20/1977    Quit date: 07/20/1982  . Smokeless tobacco: Never Used     Comment: quit 1980's  . Alcohol Use: No  . Drug Use: No  . Sexual Activity: Yes    Birth Control/ Protection: None   Other Topics Concern  . Not on file   Social History Narrative     No family history of premature CAD in 1st degree relatives.  Prior to Admission medications   Medication Sig Start Date End Date Taking? Authorizing Provider  aspirin EC 81 MG tablet Take 81 mg by mouth daily.   Yes Historical Provider, MD  atorvastatin (LIPITOR) 20 MG tablet Take 20 mg by mouth daily.   Yes Historical Provider, MD  canagliflozin (INVOKANA) 100 MG TABS tablet Take 1 tablet (100 mg total) by mouth daily. 12/25/14  Yes Cassandria Anger, MD  gabapentin (NEURONTIN) 600 MG tablet Take 2,400 mg by mouth at bedtime.   Yes Historical Provider, MD  insulin detemir (LEVEMIR) 100 UNIT/ML injection Inject 50 Units into the skin at bedtime.   Yes Historical Provider, MD  metFORMIN (GLUCOPHAGE) 500 MG tablet Take 1,000 mg by mouth 2 (two) times daily with a meal. 2 pills 2 times a day    Yes Historical Provider, MD  oxyCODONE-acetaminophen (PERCOCET) 5-325 MG tablet Take 1 tablet by  mouth every 8 (eight) hours as needed for severe pain.   Yes Historical Provider, MD     Review of systems complete and found to be negative unless listed above in HPI     Physical exam Height 6\' 6"  (1.981 m), weight 279 lb (126.554 kg). General: NAD Neck: No JVD, no thyromegaly or thyroid nodule.  Lungs: Clear to auscultation bilaterally with normal respiratory effort. CV: Nondisplaced PMI. Regular rate and rhythm, normal S1/S2, no S3/S4, no murmur.  No peripheral edema.  No carotid bruit.    Abdomen: Obese.  Skin: Intact without lesions or rashes.  Neurologic: Alert and oriented x 3.  Psych: Normal affect. Extremities: No clubbing or cyanosis.  HEENT: Normal.   ECG: Most recent ECG reviewed.  Labs:   Lab Results  Component Value Date   WBC 7.5 12/18/2014  HGB 14.6 12/18/2014   HCT 43.9 12/18/2014   MCV 81.4 12/18/2014   PLT 168 12/18/2014   No results for input(s): NA, K, CL, CO2, BUN, CREATININE, CALCIUM, PROT, BILITOT, ALKPHOS, ALT, AST, GLUCOSE in the last 168 hours.  Invalid input(s): LABALBU Lab Results  Component Value Date   TROPONINI <0.03 12/18/2014    Lab Results  Component Value Date   CHOL 148 08/31/2014   Lab Results  Component Value Date   HDL 32* 08/31/2014   Lab Results  Component Value Date   LDLCALC 88 08/31/2014   Lab Results  Component Value Date   TRIG 139 08/31/2014   Lab Results  Component Value Date   CHOLHDL 4.6 08/31/2014   No results found for: LDLDIRECT       Studies: Coronary angiography 07-02-2010 ANGIOGRAPHIC DATA: The right coronary artery arises somewhat anteriorly. It is a small nondominant vessel and is normal.  The left main coronary artery is normal.  The left anterior descending artery is a large vessel and is normal.  There is a moderate ramus intermediate branch which is normal.  Left circumflex coronary artery is a dominant vessel. There is minor narrowing of the third obtuse marginal vessel  up to 40%. Otherwise, the left circumflex system appears normal.  Left ventricular angiography performed in RAO view demonstrates normal left ventricular size and contractility with ejection fraction estimated at 60%.  FINAL INTERPRETATION: 1. No significant obstructive atherosclerotic coronary artery disease. 2. Normal left ventricular function.   ASSESSMENT AND PLAN:  1. Chest pain: Long h/o chronic chest pains not deemed cardiac in etiology. No significant epicardial CAD by cath in 06/2010 as noted above. However, blood sugars had been running in 300 range for a long time before seeing new endocrinologist in [place], Dr. Dorris Fetch. Will obtain Lexiscan Cardiolite stress test as he is unable to walk on treadmill due to upcoming foot surgery. He may have microvascular angina. Will start Ranexa 500 mg bid.  2. Hyperlipidemia: On Lipitor 20 mg.  Dispo: f/u 2 months.   Signed: Kate Sable, M.D., F.A.C.C.  01/04/2015, 1:38 PM

## 2015-01-09 ENCOUNTER — Ambulatory Visit (HOSPITAL_COMMUNITY)
Admission: RE | Admit: 2015-01-09 | Discharge: 2015-01-09 | Disposition: A | Discharge status: Home / Self Care | Payer: Medicare HMO | Source: Ambulatory Visit | Attending: Podiatry | Admitting: Podiatry

## 2015-01-09 ENCOUNTER — Encounter (HOSPITAL_COMMUNITY)
Admission: RE | Admit: 2015-01-09 | Discharge: 2015-01-09 | Disposition: A | Discharge status: Home / Self Care | Payer: Medicare HMO | Source: Ambulatory Visit | Attending: Podiatry | Admitting: Podiatry

## 2015-01-09 ENCOUNTER — Encounter (HOSPITAL_COMMUNITY): Payer: Self-pay

## 2015-01-09 DIAGNOSIS — M79674 Pain in right toe(s): Secondary | ICD-10-CM

## 2015-01-09 DIAGNOSIS — Z01812 Encounter for preprocedural laboratory examination: Secondary | ICD-10-CM | POA: Insufficient documentation

## 2015-01-09 DIAGNOSIS — Z01818 Encounter for other preprocedural examination: Secondary | ICD-10-CM | POA: Diagnosis present

## 2015-01-09 LAB — BASIC METABOLIC PANEL
Anion gap: 11 (ref 5–15)
BUN: 24 mg/dL — AB (ref 6–20)
CALCIUM: 9.5 mg/dL (ref 8.9–10.3)
CHLORIDE: 103 mmol/L (ref 101–111)
CO2: 23 mmol/L (ref 22–32)
CREATININE: 1.51 mg/dL — AB (ref 0.61–1.24)
GFR calc non Af Amer: 50 mL/min — ABNORMAL LOW (ref >60)
GFR, EST AFRICAN AMERICAN: 58 mL/min — AB (ref >60)
GLUCOSE: 144 mg/dL — AB (ref 65–99)
Potassium: 4.6 mmol/L (ref 3.5–5.1)
Sodium: 137 mmol/L (ref 135–145)

## 2015-01-09 NOTE — Patient Instructions (Signed)
    [facility]  01/09/2015     @PREFPERIOPPHARMACY @   Your procedure is scheduled on 01/17/15.  Report to [name] at 9:10 A.M.  Call this number if you have problems the morning of surgery:  [phone]   Remember:  Do not eat food or drink liquids after midnight.  Take these medicines the morning of surgery with A SIP OF WATER Percocet, Ranexa   Do not wear jewelry, make-up or nail polish.  Do not wear lotions, powders, or perfumes.  You may wear deodorant.  Do not shave 48 hours prior to surgery.  Men may shave face and neck.  Do not bring valuables to the hospital.  [facility] is not responsible for any belongings or valuables.  Contacts, dentures or bridgework may not be worn into surgery.  Leave your suitcase in the car.  After surgery it may be brought to your room.  For patients admitted to the hospital, discharge time will be determined by your treatment team.  Patients discharged the day of surgery will not be allowed to drive home.    Please read over the following fact sheets that you were given. Surgical Site Infection Prevention and Anesthesia Post-op Instructions     PATIENT INSTRUCTIONS POST-ANESTHESIA  IMMEDIATELY FOLLOWING SURGERY:  Do not drive or operate machinery for the first twenty four hours after surgery.  Do not make any important decisions for twenty four hours after surgery or while taking narcotic pain medications or sedatives.  If you develop intractable nausea and vomiting or a severe headache please notify your doctor immediately.  FOLLOW-UP:  Please make an appointment with your surgeon as instructed. You do not need to follow up with anesthesia unless specifically instructed to do so.  WOUND CARE INSTRUCTIONS (if applicable):  Keep a dry clean dressing on the anesthesia/puncture wound site if there is drainage.  Once the wound has quit draining you may leave it open to air.  Generally you should leave the bandage intact for twenty  four hours unless there is drainage.  If the epidural site drains for more than 36-48 hours please call the anesthesia department.  QUESTIONS?:  Please feel free to call your physician or the hospital operator if you have any questions, and they will be happy to assist you.

## 2015-01-10 ENCOUNTER — Encounter (HOSPITAL_COMMUNITY)
Admission: RE | Admit: 2015-01-10 | Discharge: 2015-01-10 | Disposition: A | Discharge status: Home / Self Care | Payer: Medicare HMO | Source: Ambulatory Visit | Attending: Cardiovascular Disease | Admitting: Cardiovascular Disease

## 2015-01-10 ENCOUNTER — Encounter (HOSPITAL_COMMUNITY): Payer: Self-pay

## 2015-01-10 ENCOUNTER — Telehealth: Payer: Self-pay | Admitting: *Deleted

## 2015-01-10 ENCOUNTER — Inpatient Hospital Stay (HOSPITAL_COMMUNITY): Admission: RE | Admit: 2015-01-10 | Payer: Medicare HMO | Source: Ambulatory Visit

## 2015-01-10 DIAGNOSIS — R079 Chest pain, unspecified: Secondary | ICD-10-CM | POA: Diagnosis not present

## 2015-01-10 LAB — NM MYOCAR MULTI W/SPECT W/WALL MOTION / EF
CHL CUP NUCLEAR SSS: 2
LHR: 0.3
LV dias vol: 124 mL
LVSYSVOL: 57 mL
NUC STRESS TID: 1.1
Peak HR: 86 {beats}/min
Rest HR: 63 {beats}/min
SDS: 1
SRS: 1

## 2015-01-10 MED ORDER — SODIUM CHLORIDE 0.9 % IJ SOLN
INTRAMUSCULAR | Status: AC
  Administered 2015-01-10: 10 mL via INTRAVENOUS
  Filled 2015-01-10: qty 3

## 2015-01-10 MED ORDER — REGADENOSON 0.4 MG/5ML IV SOLN
INTRAVENOUS | Status: AC
  Administered 2015-01-10: 0.4 mg via INTRAVENOUS
  Filled 2015-01-10: qty 5

## 2015-01-10 MED ORDER — TECHNETIUM TC 99M SESTAMIBI GENERIC - CARDIOLITE
30.0000 | Freq: Once | INTRAVENOUS | Status: AC | PRN
  Administered 2015-01-10: 29.8 via INTRAVENOUS

## 2015-01-10 MED ORDER — TECHNETIUM TC 99M SESTAMIBI - CARDIOLITE
10.0000 | Freq: Once | INTRAVENOUS | Status: AC | PRN
  Administered 2015-01-10: 10.5 via INTRAVENOUS

## 2015-01-10 NOTE — Telephone Encounter (Signed)
-----   Message from Herminio Commons, MD sent at 01/10/2015  1:41 PM EST ----- Normal.

## 2015-01-10 NOTE — Telephone Encounter (Signed)
Notes Recorded by Laurine Blazer, LPN on 579FGE at 579FGE PM Patient notified. Copy fwd to pmd.

## 2015-01-17 ENCOUNTER — Ambulatory Visit (HOSPITAL_COMMUNITY): Payer: Medicare HMO | Admitting: Anesthesiology

## 2015-01-17 ENCOUNTER — Encounter (HOSPITAL_COMMUNITY): Payer: Self-pay | Admitting: *Deleted

## 2015-01-17 ENCOUNTER — Ambulatory Visit (HOSPITAL_COMMUNITY): Payer: Medicare HMO

## 2015-01-17 ENCOUNTER — Ambulatory Visit (HOSPITAL_COMMUNITY)
Admission: RE | Admit: 2015-01-17 | Discharge: 2015-01-17 | Disposition: A | Discharge status: Home / Self Care | Payer: Medicare HMO | Source: Ambulatory Visit | Attending: Podiatry | Admitting: Podiatry

## 2015-01-17 ENCOUNTER — Encounter (HOSPITAL_COMMUNITY)
Admission: RE | Disposition: A | Discharge status: Home / Self Care | Payer: Self-pay | Source: Ambulatory Visit | Attending: Podiatry

## 2015-01-17 DIAGNOSIS — Z794 Long term (current) use of insulin: Secondary | ICD-10-CM | POA: Insufficient documentation

## 2015-01-17 DIAGNOSIS — T8484XD Pain due to internal orthopedic prosthetic devices, implants and grafts, subsequent encounter: Secondary | ICD-10-CM

## 2015-01-17 DIAGNOSIS — Z1889 Other specified retained foreign body fragments: Secondary | ICD-10-CM | POA: Insufficient documentation

## 2015-01-17 DIAGNOSIS — Z7982 Long term (current) use of aspirin: Secondary | ICD-10-CM | POA: Diagnosis not present

## 2015-01-17 DIAGNOSIS — Z9889 Other specified postprocedural states: Secondary | ICD-10-CM

## 2015-01-17 DIAGNOSIS — M960 Pseudarthrosis after fusion or arthrodesis: Secondary | ICD-10-CM | POA: Diagnosis not present

## 2015-01-17 DIAGNOSIS — I10 Essential (primary) hypertension: Secondary | ICD-10-CM | POA: Insufficient documentation

## 2015-01-17 DIAGNOSIS — Z79899 Other long term (current) drug therapy: Secondary | ICD-10-CM | POA: Diagnosis not present

## 2015-01-17 DIAGNOSIS — M79674 Pain in right toe(s): Secondary | ICD-10-CM | POA: Diagnosis not present

## 2015-01-17 DIAGNOSIS — Z87891 Personal history of nicotine dependence: Secondary | ICD-10-CM | POA: Diagnosis not present

## 2015-01-17 DIAGNOSIS — Z86711 Personal history of pulmonary embolism: Secondary | ICD-10-CM | POA: Insufficient documentation

## 2015-01-17 DIAGNOSIS — E119 Type 2 diabetes mellitus without complications: Secondary | ICD-10-CM | POA: Insufficient documentation

## 2015-01-17 DIAGNOSIS — Z6832 Body mass index (BMI) 32.0-32.9, adult: Secondary | ICD-10-CM | POA: Diagnosis not present

## 2015-01-17 DIAGNOSIS — M2030 Hallux varus (acquired), unspecified foot: Secondary | ICD-10-CM | POA: Insufficient documentation

## 2015-01-17 HISTORY — PX: HARDWARE REMOVAL: SHX979

## 2015-01-17 LAB — GLUCOSE, CAPILLARY
GLUCOSE-CAPILLARY: 137 mg/dL — AB (ref 65–99)
GLUCOSE-CAPILLARY: 96 mg/dL (ref 65–99)

## 2015-01-17 SURGERY — REMOVAL, HARDWARE
Anesthesia: Monitor Anesthesia Care | Site: Foot | Laterality: Right

## 2015-01-17 MED ORDER — MIDAZOLAM HCL 2 MG/2ML IJ SOLN
INTRAMUSCULAR | Status: AC
  Filled 2015-01-17: qty 2

## 2015-01-17 MED ORDER — LIDOCAINE HCL (CARDIAC) 10 MG/ML IV SOLN
INTRAVENOUS | Status: DC | PRN
  Administered 2015-01-17: 50 mg via INTRAVENOUS

## 2015-01-17 MED ORDER — FENTANYL CITRATE (PF) 100 MCG/2ML IJ SOLN
25.0000 ug | INTRAMUSCULAR | Status: AC
  Administered 2015-01-17 (×2): 25 ug via INTRAVENOUS

## 2015-01-17 MED ORDER — PROPOFOL 500 MG/50ML IV EMUL
INTRAVENOUS | Status: DC | PRN
  Administered 2015-01-17 (×2): via INTRAVENOUS
  Administered 2015-01-17: 150 ug/kg/min via INTRAVENOUS

## 2015-01-17 MED ORDER — FENTANYL CITRATE (PF) 100 MCG/2ML IJ SOLN
INTRAMUSCULAR | Status: AC
  Filled 2015-01-17: qty 2

## 2015-01-17 MED ORDER — FENTANYL CITRATE (PF) 100 MCG/2ML IJ SOLN: 25.0000 ug | INTRAMUSCULAR | Status: DC | PRN

## 2015-01-17 MED ORDER — BUPIVACAINE HCL (PF) 0.5 % IJ SOLN
INTRAMUSCULAR | Status: DC | PRN
  Administered 2015-01-17: 20 mL

## 2015-01-17 MED ORDER — BUPIVACAINE HCL (PF) 0.5 % IJ SOLN
INTRAMUSCULAR | Status: AC
  Filled 2015-01-17: qty 30

## 2015-01-17 MED ORDER — LACTATED RINGERS IV SOLN
INTRAVENOUS | Status: DC
  Administered 2015-01-17 (×2): via INTRAVENOUS

## 2015-01-17 MED ORDER — LIDOCAINE HCL (PF) 1 % IJ SOLN
INTRAMUSCULAR | Status: AC
  Filled 2015-01-17: qty 30

## 2015-01-17 MED ORDER — PROPOFOL 10 MG/ML IV BOLUS
INTRAVENOUS | Status: AC
  Filled 2015-01-17: qty 40

## 2015-01-17 MED ORDER — CEFAZOLIN SODIUM-DEXTROSE 2-3 GM-% IV SOLR
2.0000 g | INTRAVENOUS | Status: AC
  Administered 2015-01-17: 2 g via INTRAVENOUS

## 2015-01-17 MED ORDER — LIDOCAINE HCL (PF) 1 % IJ SOLN
INTRAMUSCULAR | Status: AC
  Filled 2015-01-17: qty 5

## 2015-01-17 MED ORDER — CEFAZOLIN SODIUM-DEXTROSE 2-3 GM-% IV SOLR
INTRAVENOUS | Status: AC
  Filled 2015-01-17: qty 50

## 2015-01-17 MED ORDER — ONDANSETRON HCL 4 MG/2ML IJ SOLN
4.0000 mg | Freq: Once | INTRAMUSCULAR | Status: DC | PRN
Start: 2015-01-17 — End: 2015-01-17

## 2015-01-17 MED ORDER — MIDAZOLAM HCL 2 MG/2ML IJ SOLN
1.0000 mg | INTRAMUSCULAR | Status: DC | PRN
  Administered 2015-01-17 (×3): 2 mg via INTRAVENOUS

## 2015-01-17 MED ORDER — PROPOFOL 10 MG/ML IV BOLUS
INTRAVENOUS | Status: AC
  Filled 2015-01-17: qty 20

## 2015-01-17 MED ORDER — 0.9 % SODIUM CHLORIDE (POUR BTL) OPTIME
TOPICAL | Status: DC | PRN
  Administered 2015-01-17: 1000 mL

## 2015-01-17 MED ORDER — FENTANYL CITRATE (PF) 100 MCG/2ML IJ SOLN
INTRAMUSCULAR | Status: DC | PRN
  Administered 2015-01-17 (×4): 25 ug via INTRAVENOUS

## 2015-01-17 SURGICAL SUPPLY — 46 items
APL SKNCLS STERI-STRIP NONHPOA (GAUZE/BANDAGES/DRESSINGS) ×2
BAG HAMPER (MISCELLANEOUS) ×3 IMPLANT
BANDAGE ELASTIC 4 VELCRO NS (GAUZE/BANDAGES/DRESSINGS) ×3 IMPLANT
BANDAGE ESMARK 4X12 BL STRL LF (DISPOSABLE) ×2 IMPLANT
BENZOIN TINCTURE PRP APPL 2/3 (GAUZE/BANDAGES/DRESSINGS) ×3 IMPLANT
BLADE 15 SAFETY STRL DISP (BLADE) ×6 IMPLANT
BLADE AVERAGE 25X9 (BLADE) ×3 IMPLANT
BNDG CMPR 12X4 ELC STRL LF (DISPOSABLE) ×2
BNDG CONFORM 2 STRL LF (GAUZE/BANDAGES/DRESSINGS) ×3 IMPLANT
BNDG ESMARK 4X12 BLUE STRL LF (DISPOSABLE) ×3
BNDG GAUZE ELAST 4 BULKY (GAUZE/BANDAGES/DRESSINGS) ×3 IMPLANT
BOOT STEPPER DURA LG (SOFTGOODS) IMPLANT
BOOT STEPPER DURA MED (SOFTGOODS) IMPLANT
BOOT STEPPER DURA SM (SOFTGOODS) IMPLANT
BOOT STEPPER DURA XLG (SOFTGOODS) IMPLANT
CHLORAPREP W/TINT 26ML (MISCELLANEOUS) ×3 IMPLANT
CLOTH BEACON ORANGE TIMEOUT ST (SAFETY) ×3 IMPLANT
COVER LIGHT HANDLE STERIS (MISCELLANEOUS) ×6 IMPLANT
CUFF TOURNIQUET SINGLE 18IN (TOURNIQUET CUFF) ×2 IMPLANT
DECANTER SPIKE VIAL GLASS SM (MISCELLANEOUS) ×3 IMPLANT
DRAPE OEC MINIVIEW 54X84 (DRAPES) ×3 IMPLANT
DRSG ADAPTIC 3X8 NADH LF (GAUZE/BANDAGES/DRESSINGS) ×3 IMPLANT
ELECT REM PT RETURN 9FT ADLT (ELECTROSURGICAL) ×3
ELECTRODE REM PT RTRN 9FT ADLT (ELECTROSURGICAL) ×2 IMPLANT
GAUZE SPONGE 4X4 12PLY STRL (GAUZE/BANDAGES/DRESSINGS) ×3 IMPLANT
GLOVE BIO SURGEON STRL SZ7 (GLOVE) ×2 IMPLANT
GLOVE BIO SURGEON STRL SZ7.5 (GLOVE) ×3 IMPLANT
GLOVE BIOGEL PI IND STRL 7.0 (GLOVE) ×2 IMPLANT
GLOVE BIOGEL PI INDICATOR 7.0 (GLOVE) ×2
GLOVE ECLIPSE 7.0 STRL STRAW (GLOVE) ×2 IMPLANT
GOWN STRL REUS W/TWL LRG LVL3 (GOWN DISPOSABLE) ×9 IMPLANT
KIT ROOM TURNOVER AP CYSTO (KITS) ×3 IMPLANT
MANIFOLD NEPTUNE II (INSTRUMENTS) ×3 IMPLANT
NDL HYPO 27GX1-1/4 (NEEDLE) ×3 IMPLANT
NEEDLE HYPO 27GX1-1/4 (NEEDLE) ×6 IMPLANT
NS IRRIG 1000ML POUR BTL (IV SOLUTION) ×3 IMPLANT
PACK BASIC LIMB (CUSTOM PROCEDURE TRAY) ×3 IMPLANT
PAD ARMBOARD 7.5X6 YLW CONV (MISCELLANEOUS) ×3 IMPLANT
RASP SM TEAR CROSS CUT (RASP) ×2 IMPLANT
SET BASIN LINEN APH (SET/KITS/TRAYS/PACK) ×3 IMPLANT
STRIP CLOSURE SKIN 1/2X4 (GAUZE/BANDAGES/DRESSINGS) ×4 IMPLANT
SUT PROLENE 4 0 PS 2 18 (SUTURE) ×2 IMPLANT
SUT VIC AB 2-0 CT2 27 (SUTURE) ×1 IMPLANT
SUT VIC AB 4-0 PS2 27 (SUTURE) ×1 IMPLANT
SUT VICRYL AB 3-0 FS1 BRD 27IN (SUTURE) ×3 IMPLANT
SYR CONTROL 10ML LL (SYRINGE) ×6 IMPLANT

## 2015-01-17 NOTE — Anesthesia Procedure Notes (Signed)
Procedure Name: MAC Date/Time: 01/17/2015 11:14 AM Performed by: Andree Elk, Nhyira Leano A Pre-anesthesia Checklist: Patient identified, Timeout performed, Emergency Drugs available, Suction available and Patient being monitored Patient Re-evaluated:Patient Re-evaluated prior to inductionOxygen Delivery Method: Simple face mask

## 2015-01-17 NOTE — H&P (Signed)
HISTORY AND PHYSICAL INTERVAL NOTE:  01/17/2015  10:45 AM  [place]  has presented today for surgery, with the diagnosis of non union of right great toe, hallux malleus, toe pain.  The various methods of treatment have been discussed with the patient.  No guarantees were given.  After consideration of risks, benefits and other options for treatment, the patient has consented to surgery.  I have reviewed the patients' chart and labs.    Patient Vitals for the past 24 hrs:  BP Temp Temp src Pulse Resp SpO2 Height Weight  01/17/15 0950 132/86 mmHg - - - 17 94 % - -  01/17/15 0945 135/86 mmHg - - - (!) 25 96 % - -  01/17/15 0940 (!) 145/85 mmHg - - - 15 95 % - -  01/17/15 0936 (!) 145/85 mmHg 97.2 F (36.2 C) Oral (!) 55 (!) 59 98 % 6\' 6"  (1.981 m) 280 lb (127.007 kg)  01/17/15 0935 - - - - - 95 % - -    A history and physical examination was performed in my office.  The patient was reexamined.  There have been no changes to this history and physical examination.  Marcheta Grammes, DPM

## 2015-01-17 NOTE — Op Note (Signed)
OPERATIVE NOTE  DATE OF PROCEDURE 01/17/2015  SURGEON Marcheta Grammes, [place]  OR STAFF Circulator: Towanda Malkin, RN Scrub Person: Karin Lieu, CST RN First Assistant: Coralie Keens Page, RN   PREOPERATIVE DIAGNOSIS 1.  Nonunion of the interphalangeal joint, right great toe 2.  Hallux malleus, right great toe 3.  Retained hardware 4.  Pain, right great toe  POSTOPERATIVE DIAGNOSIS Same  PROCEDURE Removal of retained hardware, right foot  ANESTHESIA Monitor Anesthesia Care   HEMOSTASIS Pneumatic ankle tourniquet set at 250 mmHg  ESTIMATED BLOOD LOSS Minimal (<5 cc)  MATERIALS USED None  INJECTABLES 0.5% Marcaine plain  PATHOLOGY None  COMPLICATIONS None  INDICATIONS:  Pain along the dorsal aspect of the right great toe.  DESCRIPTION OF THE PROCEDURE:  The patient was brought to the operating room and placed on the operative table in the supine position.  A pneumatic ankle tourniquet was applied to the operative extremity.  Following sedation, the surgical site was anesthetized with 0.5% Marcaine plain.  The foot was then prepped, scrubbed, and draped in the usual sterile technique.  The foot was elevated, exsanguinated and the pneumatic ankle tourniquet inflated to 250 mmHg.    Attention was directed to the dorsal aspect of the right great toe.  An S-shaped incision was performed over the interphalangeal joint.  Dissection was continued deep down to the level of the interphalangeal joint.  A transverse tenotomy of the extensor hallucis longus tendon was performed.  The tendon was intact and retracted proximally.  2 staples were identified and removed without difficulty.  A power bone rasp was used to smooth the dorsal surface of the proximal and distal phalanges.  Osseous union of the arthrodesis site of the interphalangeal joint was noted.  This was evaluated and confirmed under intraoperative fluoroscopy.  The wound was irrigated with copious amounts of sterile  irrigant.  The extensor tendon was reapproximated using 4-0 Vicryl.  Subcutaneous structures were reapproximated using 4-0 Vicryl.  The skin was reapproximated using 4-0 Prolene.  A sterile compressive dressing was applied.  The pneumatic ankle tourniquet was deflated and a prompt hyperemic response was noted to all digits of the operative foot.   The patient tolerated the procedure well.  The patient was then transferred to PACU with vital signs stable and vascular status intact to all toes of the operative foot.

## 2015-01-17 NOTE — Brief Op Note (Signed)
BRIEF OPERATIVE NOTE  DATE OF PROCEDURE 01/17/2015  SURGEON Marcheta Grammes, [place]  OR STAFF Circulator: Towanda Malkin, RN Scrub Person: Karin Lieu, CST RN First Assistant: Coralie Keens Page, RN   PREOPERATIVE DIAGNOSIS 1.  Nonunion of the interphalangeal joint, right great toe 2.  Hallux malleus, right great toe 3.  Retained hardware 4.  Pain, right great toe  POSTOPERATIVE DIAGNOSIS Same  PROCEDURE Removal of retained hardware, right foot  ANESTHESIA Monitor Anesthesia Care   HEMOSTASIS Pneumatic ankle tourniquet set at 250 mmHg  ESTIMATED BLOOD LOSS Minimal (<5 cc)  MATERIALS USED None  INJECTABLES 0.5% Marcaine plain  PATHOLOGY None  COMPLICATIONS None

## 2015-01-17 NOTE — Anesthesia Postprocedure Evaluation (Signed)
Anesthesia Post Note  Patient: Mordcha Burdett Lehner  Procedure(s) Performed: Procedure(s) (LRB): HARDWARE REMOVAL (Right)  Patient location during evaluation: PACU Anesthesia Type: MAC Level of consciousness: awake and alert and oriented Pain management: pain level controlled Vital Signs Assessment: post-procedure vital signs reviewed and stable Respiratory status: spontaneous breathing and respiratory function stable Cardiovascular status: stable Postop Assessment: no signs of nausea or vomiting Anesthetic complications: no    Last Vitals:  Filed Vitals:   01/17/15 1105 01/17/15 1110  BP: 123/79 133/80  Pulse:    Temp:    Resp: 19 19    Last Pain: There were no vitals filed for this visit.               ADAMS, AMY A

## 2015-01-17 NOTE — Anesthesia Preprocedure Evaluation (Signed)
Anesthesia Evaluation  Patient identified by MRN, date of birth, ID band Patient awake    Reviewed: Allergy & Precautions, H&P , NPO status , Patient's Chart, lab work & pertinent test results  Airway Mallampati: II  TM Distance: >3 FB Neck ROM: Full    Dental  (+) Teeth Intact   Pulmonary former smoker, PE   breath sounds clear to auscultation       Cardiovascular hypertension, Pt. on medications + angina (neg cath, EF 60%) + Peripheral Vascular Disease   Rhythm:Regular Rate:Normal     Neuro/Psych  Headaches,    GI/Hepatic GERD  Controlled and Medicated,  Endo/Other  diabetes, Type 2, Insulin Dependent, Oral Hypoglycemic AgentsMorbid obesity  Renal/GU      Musculoskeletal   Abdominal   Peds  Hematology   Anesthesia Other Findings   Reproductive/Obstetrics                             Anesthesia Physical Anesthesia Plan  ASA: III  Anesthesia Plan: MAC   Post-op Pain Management:    Induction: Intravenous  Airway Management Planned: Nasal Cannula  Additional Equipment:   Intra-op Plan:   Post-operative Plan:   Informed Consent: I have reviewed the patients History and Physical, chart, labs and discussed the procedure including the risks, benefits and alternatives for the proposed anesthesia with the patient or authorized representative who has indicated his/her understanding and acceptance.     Plan Discussed with:   Anesthesia Plan Comments:         Anesthesia Quick Evaluation

## 2015-01-17 NOTE — Discharge Instructions (Addendum)

## 2015-01-17 NOTE — Transfer of Care (Signed)
Immediate Anesthesia Transfer of Care Note  Patient: [name]  Procedure(s) Performed: Procedure(s): HARDWARE REMOVAL (Right)  Patient Location: PACU  Anesthesia Type:MAC  Level of Consciousness: awake, alert , oriented and patient cooperative  Airway & Oxygen Therapy: Patient Spontanous Breathing and Patient connected to nasal cannula oxygen  Post-op Assessment: Report given to RN and Post -op Vital signs reviewed and stable  Post vital signs: Reviewed and stable  Last Vitals:  Filed Vitals:   01/17/15 1105 01/17/15 1110  BP: 123/79 133/80  Pulse:    Temp:    Resp: 19 19    Complications: No apparent anesthesia complications

## 2015-01-18 ENCOUNTER — Encounter (HOSPITAL_COMMUNITY): Payer: Self-pay | Admitting: Podiatry

## 2015-02-23 ENCOUNTER — Other Ambulatory Visit: Payer: Self-pay | Admitting: "Endocrinology

## 2015-02-23 LAB — BASIC METABOLIC PANEL
BUN: 27 mg/dL — ABNORMAL HIGH (ref 7–25)
CHLORIDE: 104 mmol/L (ref 98–110)
CO2: 20 mmol/L (ref 20–31)
Calcium: 9.4 mg/dL (ref 8.6–10.3)
Creat: 1.42 mg/dL — ABNORMAL HIGH (ref 0.70–1.33)
GLUCOSE: 104 mg/dL — AB (ref 65–99)
POTASSIUM: 4.4 mmol/L (ref 3.5–5.3)
SODIUM: 139 mmol/L (ref 135–146)

## 2015-02-24 LAB — HEMOGLOBIN A1C
Hgb A1c MFr Bld: 7 % — ABNORMAL HIGH (ref <5.7)
Mean Plasma Glucose: 154 mg/dL — ABNORMAL HIGH (ref <117)

## 2015-03-02 ENCOUNTER — Ambulatory Visit (INDEPENDENT_AMBULATORY_CARE_PROVIDER_SITE_OTHER): Payer: Medicare HMO | Admitting: "Endocrinology

## 2015-03-02 ENCOUNTER — Encounter: Payer: Self-pay | Admitting: "Endocrinology

## 2015-03-02 VITALS — BP 136/83 | HR 76 | Ht 78.0 in | Wt 278.0 lb

## 2015-03-02 DIAGNOSIS — I10 Essential (primary) hypertension: Secondary | ICD-10-CM

## 2015-03-02 DIAGNOSIS — E1159 Type 2 diabetes mellitus with other circulatory complications: Secondary | ICD-10-CM

## 2015-03-02 DIAGNOSIS — E785 Hyperlipidemia, unspecified: Secondary | ICD-10-CM

## 2015-03-02 NOTE — Progress Notes (Signed)
Subjective:    Patient ID: [name], male    DOB: [birth year],    Past Medical History  Diagnosis Date  . Chest pain Sept. 2005    multiple caths  /  cath 06/15/2010..normal coronaries,  EF 60%   (false postive nuclear in the past)  . IDDM (insulin dependent diabetes mellitus) ([place])   . Hypertension   . Dyslipidemia     mixed  . Gastroparesis   . Pulmonary embolus (HCC)     Hx of small left lower lobe  pulmonary embolus  . Overweight(278.02)   . Peripheral vascular disease ([place])   . Headache(784.0)   . Neuropathy associated with endocrine disorder ([place])   . Pulmonary embolus ([place]) 2010ish  . Fibromyalgia   . Cervicalgia   . Urticaria, unspecified   . Allergic rhinitis, cause unspecified   . Lumbago   . 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 Esophageal reflux   . 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 Hyperlipidemia   . Stroke [facility])     mini stroke aug 2016   Past Surgical History  Procedure Laterality Date  . Back surgery  1999    x3  . Shoulder arthroscopy w/ rotator cuff repair Right   . Achilles tendon repair Left 2013  . Appendectomy    . Lumbar laminectomy/decompression microdiscectomy Right 01/12/2013    Procedure: Right Lumbar Three-Four Laminotomy/Foraminotomy;  Surgeon: Floyce Stakes, MD;  Location: [facility];  Service: Neurosurgery;  Laterality: Right;  Right Lumbar Three-Four Laminotomy/Foraminotomy  . 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 Nissen fundoplication    . Amputation Left 07/21/2013    Procedure: PARTIAL AMPUTATION 2ND TOE LEFT FOOT;  Surgeon: Marcheta Grammes, DPM;  Location: [facility];  Service: Podiatry;  Laterality: Left;  . Foot arthrodesis Right  01/11/2014    Procedure: ARTHRODESIS INTERPHALANGEAL JOINT HALLUX RIGHT FOOT;  Surgeon: Marcheta Grammes, DPM;  Location: [facility];  Service: Podiatry;  Laterality: Right;  . Hardware removal Right 01/17/2015    Procedure: HARDWARE REMOVAL;  Surgeon: Caprice Beaver, DPM;  Location: [facility];  Service: Podiatry;  Laterality: Right;   Social History   Social History  . Marital Status: Married    Spouse Name: N/A  . Number of Children: 2  . Years of Education: 10th   Occupational History  . Retired- Research officer, trade union    Social History Main Topics  . Smoking status: Former Smoker -- 3.00 packs/day for 5 years    Types: Cigarettes    Start date: 06/20/1977    Quit date: 07/20/1982  . Smokeless tobacco: Never Used     Comment: quit 1980's  . Alcohol Use: No  . Drug Use: No  . Sexual Activity: Yes    Birth Control/ Protection: None   Other Topics Concern  . None   Social History Narrative   Outpatient Encounter Prescriptions as of 03/02/2015  Medication Sig  . aspirin EC 81 MG tablet Take 81 mg by mouth daily.  [name] atorvastatin (LIPITOR) 20 MG tablet Take 20 mg by mouth daily.  . canagliflozin (INVOKANA) 100 MG TABS tablet Take 1 tablet (100 mg total) by mouth daily.  [name] gabapentin (NEURONTIN) 600 MG tablet Take 2,400 mg by  mouth at bedtime.  . insulin detemir (LEVEMIR) 100 UNIT/ML injection Inject 50 Units into the skin at bedtime.  . metFORMIN (GLUCOPHAGE) 500 MG tablet Take 1,000 mg by mouth 2 (two) times daily with a meal. 2 pills 2 times a day   . oxyCODONE-acetaminophen (PERCOCET) 5-325 MG tablet Take 1 tablet by mouth every 8 (eight) hours as needed for severe pain.  . ranolazine (RANEXA) 500 MG 12 hr tablet Take 1 tablet (500 mg total) by mouth 2 (two) times daily.   No facility-administered encounter medications on file as of 03/02/2015.   ALLERGIES: Allergies  Allergen Reactions  . Reglan [Metoclopramide] Itching and Rash   VACCINATION STATUS: Immunization History   Administered Date(s) Administered  . Influenza,inj,Quad PF,36+ Mos 01/13/2013    Diabetes He presents for his follow-up diabetic visit. He has type 2 diabetes mellitus. Onset time: He was diagnosed at approximate age of 89 years. His disease course has been improving. There are no hypoglycemic associated symptoms. Pertinent negatives for hypoglycemia include no confusion, pallor or seizures. There are no diabetic associated symptoms. Pertinent negatives for diabetes include no chest pain, no fatigue, no polydipsia, no polyphagia, no polyuria and no weakness. There are no hypoglycemic complications. Symptoms are improving. Diabetic complications include a CVA. Pertinent negatives for diabetic complications include no heart disease. Risk factors for coronary artery disease include diabetes mellitus, dyslipidemia, male sex, obesity and sedentary lifestyle. Current diabetic treatment includes insulin injections and oral agent (dual therapy). He is compliant with treatment most of the time. His weight is stable. He is following a diabetic diet. He has had a previous visit with a dietitian. He rarely participates in exercise. His home blood glucose trend is decreasing steadily. His breakfast blood glucose range is generally 130-140 mg/dl. His lunch blood glucose range is generally 130-140 mg/dl. His dinner blood glucose range is generally 130-140 mg/dl. His overall blood glucose range is 130-140 mg/dl. An ACE inhibitor/angiotensin II receptor blocker is not being taken. He sees a podiatrist.Eye exam is current.  Hyperlipidemia This is a chronic problem. The current episode started more than 1 year ago. Pertinent negatives include no chest pain, leg pain or myalgias. Current antihyperlipidemic treatment includes statins. Risk factors for coronary artery disease include diabetes mellitus, dyslipidemia, male sex, a sedentary lifestyle and obesity.     Review of Systems  Constitutional: Negative for fatigue and  unexpected weight change.  HENT: Negative for dental problem, mouth sores and trouble swallowing.   Eyes: Negative for visual disturbance.  Respiratory: Negative for cough, choking, chest tightness and wheezing.   Cardiovascular: Negative for chest pain and leg swelling.  Gastrointestinal: Negative for nausea, vomiting, abdominal pain, diarrhea, constipation and abdominal distention.  Endocrine: Negative for polydipsia, polyphagia and polyuria.  Genitourinary: Negative for dysuria, urgency, hematuria and flank pain.  Musculoskeletal: Negative for myalgias, back pain and gait problem.  Skin: Negative for pallor, rash and wound.  Neurological: Negative for seizures, syncope, weakness and numbness.  Psychiatric/Behavioral: Negative.  Negative for confusion and dysphoric mood.    Objective:    BP 136/83 mmHg  Pulse 76  Ht 6\' 6"  (1.981 m)  Wt 278 lb (126.1 kg)  BMI 32.13 kg/m2  SpO2 96%  Wt Readings from Last 3 Encounters:  03/02/15 278 lb (126.1 kg)  01/17/15 280 lb (127.007 kg)  01/09/15 280 lb (127.007 kg)    Physical Exam  Constitutional: He is oriented to person, place, and time. He appears well-developed and well-nourished. He is cooperative. No distress.  HENT:  Head: Normocephalic and atraumatic.  Eyes: EOM are normal.  Neck: Normal range of motion. Neck supple. No tracheal deviation present. No thyromegaly present.  Cardiovascular: Normal rate, S1 normal, S2 normal and normal heart sounds.  Exam reveals no gallop.   No murmur heard. Pulses:      Dorsalis pedis pulses are 1+ on the right side, and 1+ on the left side.       Posterior tibial pulses are 1+ on the right side, and 1+ on the left side.  Pulmonary/Chest: Breath sounds normal. No respiratory distress. He has no wheezes.  Abdominal: Soft. Bowel sounds are normal. He exhibits no distension. There is no tenderness. There is no guarding and no CVA tenderness.  Musculoskeletal: He exhibits no edema.       Right  shoulder: He exhibits no swelling and no deformity.  Neurological: He is alert and oriented to person, place, and time. He has normal strength and normal reflexes. No cranial nerve deficit or sensory deficit. Gait normal.  Skin: Skin is warm and dry. No rash noted. No cyanosis. Nails show no clubbing.  Psychiatric: He has a normal mood and affect. His speech is normal and behavior is normal. Judgment and thought content normal. Cognition and memory are normal.    Results for orders placed or performed in visit on [id number]  Basic metabolic panel  Result Value Ref Range   Sodium 139 135 - 146 mmol/L   Potassium 4.4 3.5 - 5.3 mmol/L   Chloride 104 98 - 110 mmol/L   CO2 20 20 - 31 mmol/L   Glucose, Bld 104 (H) 65 - 99 mg/dL   BUN 27 (H) 7 - 25 mg/dL   Creat 1.42 (H) 0.70 - 1.33 mg/dL   Calcium 9.4 8.6 - 10.3 mg/dL  Hemoglobin A1c  Result Value Ref Range   Hgb A1c MFr Bld 7.0 (H) <5.7 %   Mean Plasma Glucose 154 (H) <117 mg/dL   Diabetic Labs (most recent): Lab Results  Component Value Date   HGBA1C 7.0* 02/23/2015   HGBA1C 7.4* 11/17/2014   HGBA1C 8.7* 08/30/2014   Lipid Panel     Component Value Date/Time   CHOL 148 08/31/2014 0653   TRIG 139 08/31/2014 0653   HDL 32* 08/31/2014 0653   CHOLHDL 4.6 08/31/2014 0653   VLDL 28 08/31/2014 0653   LDLCALC 88 08/31/2014 0653     Assessment & Plan:   1. Type 2 diabetes mellitus with vascular disease (HCC)  His diabetes is  complicated by CVA and patient remains at a high risk for more acute and chronic complications of diabetes which include CAD, CVA, CKD, retinopathy, and neuropathy. These are all discussed in detail with the patient.  Patient came with significantly improved glucose profile, and  recent A1c of 7 %, improving from 8.5%.  Glucose logs and insulin administration records pertaining to this visit,  to be scanned into patient's records.  Recent labs reviewed. He did not use NovoLog when blood glucose pre-meal was  between 90 and 150 mg/dl.   - I have re-counseled the patient on diet management and weight loss  by adopting a carbohydrate restricted / protein rich  Diet.  - Suggestion is made for patient to avoid simple carbohydrates   from their diet including Cakes , Desserts, Ice Cream,  Soda (  diet and regular) , Sweet Tea , Candies,  Chips, Cookies, Artificial Sweeteners,   and "Sugar-free" Products .  This will help patient to have  stable blood glucose profile and potentially avoid unintended  Weight gain.  - Patient is advised to stick to a routine mealtimes to eat 3 meals  a day and avoid unnecessary snacks ( to snack only to correct hypoglycemia).  - The patient  has been  scheduled with Jearld Fenton, RDN, CDE for individualized DM education.  - I have approached patient with the following individualized plan to manage diabetes and patient agrees.  -I will lower basal insulin Levemir  To 40 units QHS, and hold Novolog for now. -He will continue to monitor blood glucose every morning before breakfast. -Patient is warned not to take insulin without proper monitoring per orders.  -Patient is encouraged to call clinic for blood glucose levels less than 70 or above 300 mg /dl.  -For better insulin sensitivity I will continue MTF 1gm po BID, and Invokana 100mg  po qam.  - Patient will be considered for incretin therapy as appropriate next visit. - Patient specific target  for A1c; LDL, HDL, Triglycerides, and  Waist Circumference were discussed in detail.  2) BP/HTN: Controlled, no medications currently. 3) Lipids/HPL:  continue statins. 4)  Weight/Diet: CDE consult in progress, exercise, and carbohydrates information provided.  5) Chronic Care/Health Maintenance:  -Patient is on Statin medications and encouraged to continue to follow up with Ophthalmology, Podiatrist at least yearly or according to recommendations, and advised to  stay away from smoking. I have recommended yearly flu vaccine  and pneumonia vaccination at least every 5 years; moderate intensity exercise for up to 150 minutes weekly; and  sleep for at least 7 hours a day.  I advised patient to maintain close follow up with their PCP for primary care needs.  Patient is asked to bring meter and  blood glucose logs during their next visit.   Follow up plan: Return in about 3 months (around 05/31/2015) for diabetes, high blood pressure, high cholesterol, follow up with pre-visit labs, meter, and logs.  Dustin Lloyd, MD Phone: [phone]  Fax: [phone]   03/02/2015, 10:05 AM

## 2015-03-02 NOTE — Patient Instructions (Signed)

## 2015-03-19 ENCOUNTER — Encounter: Payer: Self-pay | Admitting: Cardiovascular Disease

## 2015-03-19 ENCOUNTER — Ambulatory Visit (INDEPENDENT_AMBULATORY_CARE_PROVIDER_SITE_OTHER): Payer: Medicare HMO | Admitting: Cardiovascular Disease

## 2015-03-19 VITALS — BP 130/88 | HR 72 | Ht 78.0 in | Wt 271.0 lb

## 2015-03-19 DIAGNOSIS — E785 Hyperlipidemia, unspecified: Secondary | ICD-10-CM | POA: Diagnosis not present

## 2015-03-19 DIAGNOSIS — R079 Chest pain, unspecified: Secondary | ICD-10-CM

## 2015-03-19 MED ORDER — RANOLAZINE ER 1000 MG PO TB12: 1000.0000 mg | ORAL_TABLET | Freq: Two times a day (BID) | ORAL | Status: DC

## 2015-03-19 NOTE — Addendum Note (Signed)
Addended by: Laurine Blazer on: 03/19/2015 02:40 PM   Modules accepted: Orders

## 2015-03-19 NOTE — Patient Instructions (Addendum)
   Increase Ranexa to 1,000mg  twice a day  - new sent to [facility] today. Continue all other medications.   Your physician wants you to follow up in: 6 months.  You will receive a reminder letter in the mail one-two months in advance.  If you don't receive a letter, please call our office to schedule the follow up appointment

## 2015-03-19 NOTE — Progress Notes (Signed)
Patient ID: PER NOTARO, male   DOB: 1960/03/15, 55 y.o.   MRN: [id number]      SUBJECTIVE: The patient returns for follow-up after undergoing cardiovascular testing performed for the evaluation of chest pain.  Nuclear stress test on 01/10/15 was low risk with no evidence of ischemia or scar, calculated LVEF 54%.  Has chest pains twice per week which is less frequent since starting Ranexa. Does have chest pain with significant exertion or climbing steps.   Review of Systems: As per "subjective", otherwise negative.  Allergies  Allergen Reactions  . Reglan [Metoclopramide] Itching and Rash    Current Outpatient Prescriptions  Medication Sig Dispense Refill  . aspirin EC 81 MG tablet Take 81 mg by mouth daily.    [name] atorvastatin (LIPITOR) 20 MG tablet Take 20 mg by mouth daily.    . canagliflozin (INVOKANA) 100 MG TABS tablet Take 1 tablet (100 mg total) by mouth daily. 30 tablet 2  . gabapentin (NEURONTIN) 600 MG tablet Take 2,400 mg by mouth at bedtime.    . insulin detemir (LEVEMIR) 100 UNIT/ML injection Inject 40 Units into the skin at bedtime.     . metFORMIN (GLUCOPHAGE) 500 MG tablet Take 1,000 mg by mouth 2 (two) times daily with a meal. 2 pills 2 times a day     . ranolazine (RANEXA) 500 MG 12 hr tablet Take 1 tablet (500 mg total) by mouth 2 (two) times daily. 60 tablet 6   No current facility-administered medications for this visit.    Past Medical History  Diagnosis Date  . Chest pain Sept. 2005    multiple caths  /  cath 06/15/2010..normal coronaries,  EF 60%   (false postive nuclear in the past)  . IDDM (insulin dependent diabetes mellitus) ([place])   . Hypertension   . Dyslipidemia     mixed  . Gastroparesis   . Pulmonary embolus (HCC)     Hx of small left lower lobe  pulmonary embolus  . Overweight(278.02)   . Peripheral vascular disease ([place])   . Headache(784.0)   . Neuropathy associated with endocrine disorder ([place])   . Pulmonary embolus ([place]) 2010ish  .  Fibromyalgia   . Cervicalgia   . Urticaria, unspecified   . Allergic rhinitis, cause unspecified   . Lumbago   . 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 Esophageal reflux   . 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 Hyperlipidemia   . Stroke [facility])     mini stroke aug 2016    Past Surgical History  Procedure Laterality Date  . Back surgery  1999    x3  . Shoulder arthroscopy w/ rotator cuff repair Right   . Achilles tendon repair Left 2013  . Appendectomy    . Lumbar laminectomy/decompression microdiscectomy Right 01/12/2013    Procedure: Right Lumbar Three-Four Laminotomy/Foraminotomy;  Surgeon: Floyce Stakes, MD;  Location: [facility];  Service: Neurosurgery;  Laterality: Right;  Right Lumbar Three-Four Laminotomy/Foraminotomy  . 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 Nissen fundoplication    . Amputation Left 07/21/2013    Procedure: PARTIAL AMPUTATION 2ND TOE LEFT FOOT;  Surgeon: Marcheta Grammes, DPM;  Location: [facility];  Service: Podiatry;  Laterality: Left;  . Foot arthrodesis  Right 01/11/2014    Procedure: ARTHRODESIS INTERPHALANGEAL JOINT HALLUX RIGHT FOOT;  Surgeon: Marcheta Grammes, DPM;  Location: [facility];  Service: Podiatry;  Laterality: Right;  . Hardware removal Right 01/17/2015    Procedure: HARDWARE REMOVAL;  Surgeon: Caprice Beaver, DPM;  Location: [facility];  Service: Podiatry;  Laterality: Right;    Social History   Social History  . Marital Status: Married    Spouse Name: N/A  . Number of Children: 2  . Years of Education: 10th   Occupational History  . Retired- Research officer, trade union    Social History Main Topics  . Smoking status: Former Smoker -- 3.00 packs/day for 5 years     Types: Cigarettes    Start date: 06/20/1977    Quit date: 07/20/1982  . Smokeless tobacco: Never Used     Comment: quit 1980's  . Alcohol Use: No  . Drug Use: No  . Sexual Activity: Yes    Birth Control/ Protection: None   Other Topics Concern  . Not on file   Social History Narrative     Filed Vitals:   03/19/15 1414  BP: 130/88  Pulse: 72  Height: 6\' 6"  (1.981 m)  Weight: 271 lb (122.925 kg)  SpO2: 96%    PHYSICAL EXAM General: NAD HEENT: Normal. Neck: No JVD, no thyromegaly. Lungs: Clear to auscultation bilaterally with normal respiratory effort. CV: Nondisplaced PMI.  Regular rate and rhythm, normal S1/S2, no S3/S4, no murmur. No pretibial or periankle edema.    Abdomen: Obese. Neurologic: Alert and oriented.  Psych: Normal affect. Skin: Normal. Musculoskeletal: No gross deformities.  ECG: Most recent ECG reviewed.      ASSESSMENT AND PLAN: 1. Chest pain: Long h/o chronic chest pains not deemed cardiac in etiology. No significant epicardial CAD by cath in 06/2010. Low risk Lexiscan Cardiolite stress test in 01/2015. He may have microvascular angina. Will increase Ranexa to 1000 mg bid.  2. Hyperlipidemia: On Lipitor 20 mg.  Dispo: f/u 6 months.  Kate Sable, M.D., F.A.C.C.

## 2015-03-21 ENCOUNTER — Ambulatory Visit: Payer: Medicare HMO | Admitting: Cardiovascular Disease

## 2015-03-23 ENCOUNTER — Telehealth: Payer: Self-pay

## 2015-03-23 ENCOUNTER — Other Ambulatory Visit: Payer: Self-pay | Admitting: "Endocrinology

## 2015-03-23 NOTE — Telephone Encounter (Signed)
I spoke with him .He can increase Levemir to 50 units qhs.

## 2015-03-23 NOTE — Telephone Encounter (Signed)
Pt states they have had high BG readings.   Date Before breakfast Before lunch Before supper Bedtime  2/15 133     2/16 141  233   2/17 154             Pt taking: Levemir 40 units qhs, MTF 1g bid, Invokana 100mg  qd

## 2015-03-28 ENCOUNTER — Ambulatory Visit: Payer: Medicare HMO | Admitting: Nutrition

## 2015-05-22 ENCOUNTER — Other Ambulatory Visit: Payer: Self-pay | Admitting: "Endocrinology

## 2015-06-04 ENCOUNTER — Other Ambulatory Visit: Payer: Self-pay | Admitting: "Endocrinology

## 2015-06-04 ENCOUNTER — Ambulatory Visit: Payer: Medicare HMO | Admitting: "Endocrinology

## 2015-06-04 LAB — BASIC METABOLIC PANEL
BUN: 31 mg/dL — AB (ref 7–25)
CHLORIDE: 107 mmol/L (ref 98–110)
CO2: 19 mmol/L — AB (ref 20–31)
CREATININE: 1.31 mg/dL (ref 0.70–1.33)
Calcium: 9.4 mg/dL (ref 8.6–10.3)
GLUCOSE: 145 mg/dL — AB (ref 65–99)
Potassium: 4.4 mmol/L (ref 3.5–5.3)
Sodium: 139 mmol/L (ref 135–146)

## 2015-06-04 LAB — HEMOGLOBIN A1C
Hgb A1c MFr Bld: 7 % — ABNORMAL HIGH (ref <5.7)
MEAN PLASMA GLUCOSE: 154 mg/dL

## 2015-06-20 ENCOUNTER — Ambulatory Visit (INDEPENDENT_AMBULATORY_CARE_PROVIDER_SITE_OTHER): Payer: Medicare HMO | Admitting: "Endocrinology

## 2015-06-20 ENCOUNTER — Encounter: Payer: Self-pay | Admitting: "Endocrinology

## 2015-06-20 VITALS — BP 137/80 | HR 76 | Ht 78.0 in | Wt 280.0 lb

## 2015-06-20 DIAGNOSIS — E785 Hyperlipidemia, unspecified: Secondary | ICD-10-CM

## 2015-06-20 DIAGNOSIS — E1159 Type 2 diabetes mellitus with other circulatory complications: Secondary | ICD-10-CM | POA: Diagnosis not present

## 2015-06-20 DIAGNOSIS — I10 Essential (primary) hypertension: Secondary | ICD-10-CM | POA: Diagnosis not present

## 2015-06-20 MED ORDER — METFORMIN HCL 500 MG PO TABS: 500.0000 mg | ORAL_TABLET | Freq: Two times a day (BID) | ORAL | Status: DC

## 2015-06-20 MED ORDER — INSULIN DETEMIR 100 UNIT/ML ~~LOC~~ SOLN: 40.0000 [IU] | Freq: Every day | SUBCUTANEOUS | Status: DC

## 2015-06-20 MED ORDER — CANAGLIFLOZIN 100 MG PO TABS: 100.0000 mg | ORAL_TABLET | Freq: Every day | ORAL | Status: DC

## 2015-06-20 NOTE — Progress Notes (Signed)
Subjective:    Patient ID: [name], male    DOB: 1960/02/21,    Past Medical History  Diagnosis Date  . Chest pain Sept. 2005    multiple caths  /  cath 06/15/2010..normal coronaries,  EF 60%   (false postive nuclear in the past)  . IDDM (insulin dependent diabetes mellitus) ([place])   . Hypertension   . Dyslipidemia     mixed  . Gastroparesis   . Pulmonary embolus (HCC)     Hx of small left lower lobe  pulmonary embolus  . Overweight(278.02)   . Peripheral vascular disease ([place])   . Headache(784.0)   . Neuropathy associated with endocrine disorder ([place])   . Pulmonary embolus ([place]) 2010ish  . Fibromyalgia   . Cervicalgia   . Urticaria, unspecified   . Allergic rhinitis, cause unspecified   . Lumbago   . 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 Esophageal reflux   . 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 Hyperlipidemia   . Stroke [facility])     mini stroke aug 2016   Past Surgical History  Procedure Laterality Date  . Back surgery  1999    x3  . Shoulder arthroscopy w/ rotator cuff repair Right   . Achilles tendon repair Left 2013  . Appendectomy    . Lumbar laminectomy/decompression microdiscectomy Right 01/12/2013    Procedure: Right Lumbar Three-Four Laminotomy/Foraminotomy;  Surgeon: Floyce Stakes, MD;  Location: [facility];  Service: Neurosurgery;  Laterality: Right;  Right Lumbar Three-Four Laminotomy/Foraminotomy  . 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 Nissen fundoplication    . Amputation Left 07/21/2013    Procedure: PARTIAL AMPUTATION 2ND TOE LEFT FOOT;  Surgeon: Marcheta Grammes, DPM;  Location: [facility];  Service: Podiatry;  Laterality: Left;  . Foot arthrodesis Right  01/11/2014    Procedure: ARTHRODESIS INTERPHALANGEAL JOINT HALLUX RIGHT FOOT;  Surgeon: Marcheta Grammes, DPM;  Location: [facility];  Service: Podiatry;  Laterality: Right;  . Hardware removal Right 01/17/2015    Procedure: HARDWARE REMOVAL;  Surgeon: Caprice Beaver, DPM;  Location: [facility];  Service: Podiatry;  Laterality: Right;   Social History   Social History  . Marital Status: Married    Spouse Name: N/A  . Number of Children: 2  . Years of Education: 10th   Occupational History  . Retired- Research officer, trade union    Social History Main Topics  . Smoking status: Former Smoker -- 3.00 packs/day for 5 years    Types: Cigarettes    Start date: 06/20/1977    Quit date: 07/20/1982  . Smokeless tobacco: Never Used     Comment: quit 1980's  . Alcohol Use: No  . Drug Use: No  . Sexual Activity: Yes    Birth Control/ Protection: None   Other Topics Concern  . None   Social History Narrative   Outpatient Encounter Prescriptions as of 06/20/2015  Medication Sig  . aspirin EC 81 MG tablet Take 81 mg by mouth daily.  [name] atorvastatin (LIPITOR) 20 MG tablet Take 20 mg by mouth daily.  . canagliflozin (INVOKANA) 100 MG TABS tablet Take 1 tablet (100 mg total) by mouth daily.  [name] gabapentin (NEURONTIN) 600 MG tablet Take 2,400 mg by  mouth at bedtime.  . insulin detemir (LEVEMIR) 100 UNIT/ML injection Inject 0.4 mLs (40 Units total) into the skin at bedtime.  . metFORMIN (GLUCOPHAGE) 500 MG tablet Take 1 tablet (500 mg total) by mouth 2 (two) times daily with a meal. 2 pills 2 times a day  . ranolazine (RANEXA) 1000 MG SR tablet Take 1 tablet (1,000 mg total) by mouth 2 (two) times daily.  . [DISCONTINUED] insulin detemir (LEVEMIR) 100 UNIT/ML injection Inject 40 Units into the skin at bedtime.  . [DISCONTINUED] INVOKANA 100 MG TABS tablet TAKE 1 TABLET BY MOUTH EVERY DAY  . [DISCONTINUED] metFORMIN (GLUCOPHAGE) 500 MG tablet Take 1,000 mg by mouth 2 (two) times daily with a meal. 2 pills 2  times a day    No facility-administered encounter medications on file as of 06/20/2015.   ALLERGIES: Allergies  Allergen Reactions  . Reglan [Metoclopramide] Itching and Rash   VACCINATION STATUS: Immunization History  Administered Date(s) Administered  . Influenza,inj,Quad PF,36+ Mos 01/13/2013    Diabetes He presents for his follow-up diabetic visit. He has type 2 diabetes mellitus. Onset time: He was diagnosed at approximate age of 6 years. His disease course has been improving. There are no hypoglycemic associated symptoms. Pertinent negatives for hypoglycemia include no confusion, pallor or seizures. There are no diabetic associated symptoms. Pertinent negatives for diabetes include no chest pain, no fatigue, no polydipsia, no polyphagia, no polyuria and no weakness. There are no hypoglycemic complications. Symptoms are improving. Diabetic complications include a CVA. Pertinent negatives for diabetic complications include no heart disease. Risk factors for coronary artery disease include diabetes mellitus, dyslipidemia, male sex, obesity and sedentary lifestyle. Current diabetic treatment includes insulin injections and oral agent (dual therapy) (Against advice, he used NovoLog 15 units at bedtime. This caused some tight fasting blood glucose readings to as low as 46.). He is compliant with treatment most of the time. His weight is stable. He is following a diabetic diet. He has had a previous visit with a dietitian. He rarely participates in exercise. His home blood glucose trend is decreasing steadily. His breakfast blood glucose range is generally 110-130 mg/dl. An ACE inhibitor/angiotensin II receptor blocker is not being taken. He sees a podiatrist.Eye exam is current.  Hyperlipidemia This is a chronic problem. The current episode started more than 1 year ago. Pertinent negatives include no chest pain, leg pain or myalgias. Current antihyperlipidemic treatment includes statins. Risk  factors for coronary artery disease include diabetes mellitus, dyslipidemia, male sex, a sedentary lifestyle and obesity.     Review of Systems  Constitutional: Negative for fatigue and unexpected weight change.  HENT: Negative for dental problem, mouth sores and trouble swallowing.   Eyes: Negative for visual disturbance.  Respiratory: Negative for cough, choking, chest tightness and wheezing.   Cardiovascular: Negative for chest pain and leg swelling.  Gastrointestinal: Negative for nausea, vomiting, abdominal pain, diarrhea, constipation and abdominal distention.  Endocrine: Negative for polydipsia, polyphagia and polyuria.  Genitourinary: Negative for dysuria, urgency, hematuria and flank pain.  Musculoskeletal: Negative for myalgias, back pain and gait problem.  Skin: Negative for pallor, rash and wound.  Neurological: Negative for seizures, syncope, weakness and numbness.  Psychiatric/Behavioral: Negative.  Negative for confusion and dysphoric mood.    Objective:    BP 137/80 mmHg  Pulse 76  Ht 6\' 6"  (1.981 m)  Wt 280 lb (127.007 kg)  BMI 32.36 kg/m2  SpO2 95%  Wt Readings from Last 3 Encounters:  06/20/15 280 lb (127.007 kg)  03/19/15 271 lb (122.925 kg)  03/02/15 278 lb (126.1 kg)    Physical Exam  Constitutional: He is oriented to person, place, and time. He appears well-developed and well-nourished. He is cooperative. No distress.  HENT:  Head: Normocephalic and atraumatic.  Eyes: EOM are normal.  Neck: Normal range of motion. Neck supple. No tracheal deviation present. No thyromegaly present.  Cardiovascular: Normal rate, S1 normal, S2 normal and normal heart sounds.  Exam reveals no gallop.   No murmur heard. Pulses:      Dorsalis pedis pulses are 1+ on the right side, and 1+ on the left side.       Posterior tibial pulses are 1+ on the right side, and 1+ on the left side.  Pulmonary/Chest: Breath sounds normal. No respiratory distress. He has no wheezes.   Abdominal: Soft. Bowel sounds are normal. He exhibits no distension. There is no tenderness. There is no guarding and no CVA tenderness.  Musculoskeletal: He exhibits no edema.       Right shoulder: He exhibits no swelling and no deformity.  Neurological: He is alert and oriented to person, place, and time. He has normal strength and normal reflexes. No cranial nerve deficit or sensory deficit. Gait normal.  Skin: Skin is warm and dry. No rash noted. No cyanosis. Nails show no clubbing.  Psychiatric: He has a normal mood and affect. His speech is normal and behavior is normal. Judgment and thought content normal. Cognition and memory are normal.    Results for orders placed or performed in visit on [id number]  Basic metabolic panel  Result Value Ref Range   Sodium 139 135 - 146 mmol/L   Potassium 4.4 3.5 - 5.3 mmol/L   Chloride 107 98 - 110 mmol/L   CO2 19 (L) 20 - 31 mmol/L   Glucose, Bld 145 (H) 65 - 99 mg/dL   BUN 31 (H) 7 - 25 mg/dL   Creat 1.31 0.70 - 1.33 mg/dL   Calcium 9.4 8.6 - 10.3 mg/dL  Hemoglobin A1c  Result Value Ref Range   Hgb A1c MFr Bld 7.0 (H) <5.7 %   Mean Plasma Glucose 154 mg/dL   Diabetic Labs (most recent): Lab Results  Component Value Date   HGBA1C 7.0* 06/04/2015   HGBA1C 7.0* 02/23/2015   HGBA1C 7.4* 11/17/2014   Lipid Panel     Component Value Date/Time   CHOL 148 08/31/2014 0653   TRIG 139 08/31/2014 0653   HDL 32* 08/31/2014 0653   CHOLHDL 4.6 08/31/2014 0653   VLDL 28 08/31/2014 0653   LDLCALC 88 08/31/2014 0653     Assessment & Plan:   1. Type 2 diabetes mellitus with vascular disease (HCC)  His diabetes is  complicated by CVA and patient remains at a high risk for more acute and chronic complications of diabetes which include CAD, CVA, CKD, retinopathy, and neuropathy. These are all discussed in detail with the patient.  Patient came with significantly improved glucose profile, and  recent A1c of 7 %, improving from 8.5%.  Glucose logs  and insulin administration records pertaining to this visit,  to be scanned into patient's records.  Recent labs reviewed. He did not use NovoLog when blood glucose pre-meal was between 90 and 150 mg/dl.   - I have re-counseled the patient on diet management and weight loss  by adopting a carbohydrate restricted / protein rich  Diet.  - Suggestion is made for patient to avoid simple carbohydrates   from their diet including  Cakes , Desserts, Ice Cream,  Soda (  diet and regular) , Sweet Tea , Candies,  Chips, Cookies, Artificial Sweeteners,   and "Sugar-free" Products .  This will help patient to have stable blood glucose profile and potentially avoid unintended  Weight gain.  - Patient is advised to stick to a routine mealtimes to eat 3 meals  a day and avoid unnecessary snacks ( to snack only to correct hypoglycemia).  - The patient  has been  scheduled with Jearld Fenton, RDN, CDE for individualized DM education.  - I have approached patient with the following individualized plan to manage diabetes and patient agrees.  -I will lower basal insulin Levemir to 40 units QHS, and hold Novolog for now. -I strictly warned him not to take NovoLog especially at bedtime. -He will continue to monitor blood glucose every morning before breakfast. -Patient is warned not to take insulin without proper monitoring per orders.  -Patient is encouraged to call clinic for blood glucose levels less than 70 or above 300 mg /dl.  -For better insulin sensitivity I will continue MTF 1gm po BID, and Invokana 100mg  po qam.  - Patient will be considered for incretin therapy as appropriate next visit. - Patient specific target  for A1c; LDL, HDL, Triglycerides, and  Waist Circumference were discussed in detail.  2) BP/HTN: Controlled, no medications currently. 3) Lipids/HPL:  continue statins. 4)  Weight/Diet: CDE consult in progress, exercise, and carbohydrates information provided.  5) Chronic Care/Health  Maintenance:  -Patient is on Statin medications and encouraged to continue to follow up with Ophthalmology, Podiatrist at least yearly or according to recommendations, and advised to  stay away from smoking. I have recommended yearly flu vaccine and pneumonia vaccination at least every 5 years; moderate intensity exercise for up to 150 minutes weekly; and  sleep for at least 7 hours a day.  I advised patient to maintain close follow up with their PCP for primary care needs.  Patient is asked to bring meter and  blood glucose logs during their next visit.   Follow up plan: Return in about 3 months (around 09/20/2015) for diabetes, high blood pressure, high cholesterol, follow up with pre-visit labs, meter, and logs.  Glade Lloyd, MD Phone: [phone]  Fax: [phone]   06/20/2015, 10:01 AM

## 2015-06-20 NOTE — Patient Instructions (Signed)

## 2015-07-30 ENCOUNTER — Telehealth: Payer: Self-pay

## 2015-07-30 NOTE — Telephone Encounter (Signed)
Pt states he has had high BG readings.   Date Before breakfast Before lunch Before supper Bedtime  6/23 177     6/24 225     6/25 162     6/26 155 298      Pt taking: Levemir 40 units qhs, MTF 500 bid

## 2015-07-30 NOTE — Telephone Encounter (Signed)
Increase Levemir to 50 units daily at bedtime, continue metformin 500 mg by mouth twice a day.

## 2015-07-31 NOTE — Telephone Encounter (Signed)
Left message per pt with instructions. Advised to call back w/ any questions.

## 2015-08-01 ENCOUNTER — Telehealth: Payer: Self-pay | Admitting: Cardiovascular Disease

## 2015-08-01 NOTE — Telephone Encounter (Signed)
Pt says symptoms - described below - have been on and off for last 3 weeks. Says he stopped taking Ranexa all together for 3 days and didn't have a headache then resumed and headaches started again. Pt has been taking 1000 mg Ranexa bid since Feb 2017. Will forward to provider

## 2015-08-01 NOTE — Telephone Encounter (Signed)
Try reducing down to 500 mg bid.

## 2015-08-01 NOTE — Telephone Encounter (Signed)
Pt has noticed that since taking the ranolazine (RANEXA) 1000 MG SR tablet [id number] severe headaches with vision disturbance and having more chest pains when he takes it.

## 2015-08-01 NOTE — Telephone Encounter (Signed)
Pt aware, updated medication list

## 2015-08-08 ENCOUNTER — Telehealth: Payer: Self-pay

## 2015-08-08 NOTE — Telephone Encounter (Signed)
Advise to increase Lantus to 60 units qhs, report if BG >300 x 3.

## 2015-08-08 NOTE — Telephone Encounter (Signed)
Pt states he has had high BG readings.   Date Before breakfast Before lunch Before supper Bedtime  7/3 192   333  7/4 206   267  7/5 207             Pt taking: Lantus 50 units qhs, Metformin 500 mg bid

## 2015-08-09 NOTE — Telephone Encounter (Signed)
Pt.notified

## 2015-09-15 ENCOUNTER — Other Ambulatory Visit: Payer: Self-pay | Admitting: "Endocrinology

## 2015-09-18 ENCOUNTER — Encounter: Payer: Self-pay | Admitting: Adult Health

## 2015-09-18 ENCOUNTER — Ambulatory Visit (INDEPENDENT_AMBULATORY_CARE_PROVIDER_SITE_OTHER): Payer: Medicare HMO | Admitting: Adult Health

## 2015-09-18 VITALS — BP 130/83 | HR 64 | Ht 78.0 in | Wt 273.0 lb

## 2015-09-18 DIAGNOSIS — R072 Precordial pain: Secondary | ICD-10-CM

## 2015-09-18 DIAGNOSIS — I10 Essential (primary) hypertension: Secondary | ICD-10-CM | POA: Diagnosis not present

## 2015-09-18 NOTE — Progress Notes (Signed)
Cardiology Office Note   Date:  09/18/2015   ID:  [name], DOB 1960/03/11, MRN [id number]  PCP:  Arsenio Katz, NP  Cardiologist:  Woodroe Chen, NP   No chief complaint on file.     History of Present Illness: [name] is a 55 y.o. male who presents for ongoing assessment and management of chest pain. No stress test in December 2016 was low risk without evidence of ischemia or scar with confluent LVEF of 54%.   He continues to have chest pain which is less frequent since starting Ranexa. He does have chest pain and significant fatigue when climbing stairs. On last office visit Ranexa was increased to 1000 mg twice a day. He did not tolerate the higher dose as it was causing a lot of headaches. Therefore he was reduced down to 500 mg twice a day.   He comes today feeling very well in fact he is very excited as his daughter had his first grandchild last night, a little girl by the name of "Hanley Seamen". He denies any chest pain dyspnea. He continues to have myalgia pain from diabetes. He is being followed by endocrinology who is adjusting his insulin dosages otherwise he's doing well remains very active.  Past Medical History:  Diagnosis Date  . Allergic rhinitis, cause unspecified   . Cervicalgia   . Chest pain Sept. 2005   multiple caths  /  cath 06/15/2010..normal coronaries,  EF 60%   (false postive nuclear in the past)  . Dyslipidemia    mixed  . Esophageal reflux   . Fibromyalgia   . Gastroparesis   . Headache(784.0)   . Hyperlipidemia   . Hypertension   . IDDM (insulin dependent diabetes mellitus) ([place])   . Lumbago   . Neuropathy associated with endocrine disorder ([place])   . Overweight(278.02)   . Peripheral vascular disease ([place])   . Pulmonary embolus (HCC)    Hx of small left lower lobe  pulmonary embolus  . Pulmonary embolus ([place]) 2010ish  . Stroke [facility])    mini stroke aug 2016  . Type II or unspecified type diabetes mellitus with  neurological manifestations, not stated as uncontrolled   . 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 Urticaria, unspecified     Past Surgical History:  Procedure Laterality Date  . ACHILLES TENDON REPAIR Left 2013  . AMPUTATION Left 06/02/2013   Procedure: AMPUTATION 1ST TOE LEFT FOOT;  Surgeon: Marcheta Grammes, DPM;  Location: [facility];  Service: Orthopedics;  Laterality: Left;  . AMPUTATION Left 07/21/2013   Procedure: PARTIAL AMPUTATION 2ND TOE LEFT FOOT;  Surgeon: Marcheta Grammes, DPM;  Location: [facility];  Service: Podiatry;  Laterality: Left;  . APPENDECTOMY    . BACK SURGERY  1999   x3  . FOOT ARTHRODESIS Right 01/11/2014   Procedure: ARTHRODESIS INTERPHALANGEAL JOINT HALLUX RIGHT FOOT;  Surgeon: Marcheta Grammes, DPM;  Location: [facility];  Service: Podiatry;  Laterality: Right;  . HARDWARE REMOVAL Right 01/17/2015   Procedure: HARDWARE REMOVAL;  Surgeon: Caprice Beaver, DPM;  Location: [facility];  Service: Podiatry;  Laterality: Right;  . LUMBAR LAMINECTOMY/DECOMPRESSION MICRODISCECTOMY Right 01/12/2013   Procedure: Right Lumbar Three-Four Laminotomy/Foraminotomy;  Surgeon: Floyce Stakes, MD;  Location: [facility];  Service: Neurosurgery;  Laterality: Right;  Right Lumbar Three-Four Laminotomy/Foraminotomy  . NISSEN FUNDOPLICATION    . SHOULDER ARTHROSCOPY W/ ROTATOR CUFF REPAIR Right  Current Outpatient Prescriptions  Medication Sig Dispense Refill  . aspirin EC 81 MG tablet Take 81 mg by mouth daily.    [name] atorvastatin (LIPITOR) 20 MG tablet Take 20 mg by mouth daily.    . canagliflozin (INVOKANA) 100 MG TABS tablet Take 1 tablet (100 mg total) by mouth daily. 30 tablet 2  . gabapentin (NEURONTIN) 600 MG tablet Take 2,400 mg by mouth at bedtime.    . insulin detemir (LEVEMIR) 100 UNIT/ML injection Inject 0.6 mLs (60 Units total) into the skin at bedtime. 20 mL 2  . metFORMIN  (GLUCOPHAGE) 500 MG tablet Take 1 tablet (500 mg total) by mouth 2 (two) times daily with a meal. 2 pills 2 times a day (Patient taking differently: Take 1,000 mg by mouth 2 (two) times daily with a meal. 2 pills 2 times a day) 60 tablet 2  . RANEXA 1000 MG SR tablet Take 1 tablet by mouth daily.     No current facility-administered medications for this visit.     Allergies:   Reglan [metoclopramide]    Social History:  The patient  reports that he quit smoking about 33 years ago. His smoking use included Cigarettes. He started smoking about 38 years ago. He has a 15.00 pack-year smoking history. He has never used smokeless tobacco. He reports that he does not drink alcohol or use drugs.   Family History:  The patient's family history includes Diabetes in his mother; Healthy in his brother; Heart attack (age of onset: 19) in his father; Heart attack (age of onset: 65) in his mother; Hyperlipidemia in his father and mother; Hypertension in his father and mother; Seizures in his brother.    ROS: All other systems are reviewed and negative. Unless otherwise mentioned in H&P    PHYSICAL EXAM: VS:  BP 130/83   Pulse 64   Ht 6\' 6"  (1.981 m)   Wt 273 lb (123.8 kg)   BMI 31.55 kg/m  , BMI Body mass index is 31.55 kg/m. GEN: Well nourished, well developed, in no acute distress  HEENT: normal  Neck: no JVD, carotid bruits, or masses Cardiac: RRR; no murmurs, rubs, or gallops,no edema  Respiratory:  Clear to auscultation bilaterally, normal work of breathing GI: soft, nontender, nondistended, + BS MS: no deformity or atrophy  Skin: warm and dry, no rash Neuro:  Strength and sensation are intact Psych: euthymic mood, full affect  Recent Labs: 12/18/2014: Hemoglobin 14.6; Platelets 168 06/04/2015: BUN 31; Creat 1.31; Potassium 4.4; Sodium 139    Lipid Panel    Component Value Date/Time   CHOL 148 08/31/2014 0653   TRIG 139 08/31/2014 0653   HDL 32 (L) 08/31/2014 0653   CHOLHDL 4.6  08/31/2014 0653   VLDL 28 08/31/2014 0653   LDLCALC 88 08/31/2014 0653      Wt Readings from Last 3 Encounters:  09/18/15 273 lb (123.8 kg)  06/20/15 280 lb (127 kg)  03/19/15 271 lb (122.9 kg)    ASSESSMENT AND PLAN:  1.  Chronic chest pain: Unable to tolerate 2000 mg daily Ranexa, it is now at 1000 mg daily, is not taking it in divided doses. He feels much better with less discomfort in his chest and no side effects of headache with reduced dose.  2. Hypertension: Very well controlled on medication regimen. Will not make any changes.  Current medicines are reviewed at length with the patient today.    Labs/ tests ordered today include: No orders of the defined types  were placed in this encounter.    Disposition:   FU with 6 months Signed, Jory Sims, NP  09/18/2015 9:30 AM    [facility] [address], [place], [place] [address] Phone: [phone]; Fax: [phone]

## 2015-09-18 NOTE — Patient Instructions (Signed)

## 2015-09-19 ENCOUNTER — Other Ambulatory Visit: Payer: Self-pay | Admitting: "Endocrinology

## 2015-09-25 ENCOUNTER — Other Ambulatory Visit: Payer: Self-pay | Admitting: "Endocrinology

## 2015-09-27 ENCOUNTER — Other Ambulatory Visit: Payer: Self-pay | Admitting: "Endocrinology

## 2015-09-28 LAB — COMPLETE METABOLIC PANEL WITH GFR
ALBUMIN: 4.4 g/dL (ref 3.6–5.1)
ALK PHOS: 89 U/L (ref 40–115)
ALT: 23 U/L (ref 9–46)
AST: 19 U/L (ref 10–35)
BUN: 30 mg/dL — AB (ref 7–25)
CALCIUM: 10.2 mg/dL (ref 8.6–10.3)
CO2: 18 mmol/L — ABNORMAL LOW (ref 20–31)
CREATININE: 1.58 mg/dL — AB (ref 0.70–1.33)
Chloride: 107 mmol/L (ref 98–110)
GFR, Est African American: 56 mL/min — ABNORMAL LOW (ref >=60)
GFR, Est Non African American: 48 mL/min — ABNORMAL LOW (ref >=60)
GLUCOSE: 120 mg/dL — AB (ref 65–99)
Potassium: 4.6 mmol/L (ref 3.5–5.3)
Sodium: 139 mmol/L (ref 135–146)
TOTAL PROTEIN: 7.1 g/dL (ref 6.1–8.1)
Total Bilirubin: 0.4 mg/dL (ref 0.2–1.2)

## 2015-09-28 LAB — HEMOGLOBIN A1C
HEMOGLOBIN A1C: 7.8 % — AB (ref <5.7)
Mean Plasma Glucose: 177 mg/dL

## 2015-10-05 ENCOUNTER — Ambulatory Visit (INDEPENDENT_AMBULATORY_CARE_PROVIDER_SITE_OTHER): Payer: Medicare HMO | Admitting: "Endocrinology

## 2015-10-05 ENCOUNTER — Encounter: Payer: Self-pay | Admitting: "Endocrinology

## 2015-10-05 VITALS — BP 136/76 | HR 63 | Ht 78.0 in | Wt 272.0 lb

## 2015-10-05 DIAGNOSIS — E669 Obesity, unspecified: Secondary | ICD-10-CM

## 2015-10-05 DIAGNOSIS — I10 Essential (primary) hypertension: Secondary | ICD-10-CM

## 2015-10-05 DIAGNOSIS — E1159 Type 2 diabetes mellitus with other circulatory complications: Secondary | ICD-10-CM | POA: Diagnosis not present

## 2015-10-05 DIAGNOSIS — E785 Hyperlipidemia, unspecified: Secondary | ICD-10-CM | POA: Diagnosis not present

## 2015-10-05 HISTORY — PX: KNEE ARTHROSCOPY: SUR90

## 2015-10-05 MED ORDER — GABAPENTIN 300 MG PO CAPS: 300.0000 mg | ORAL_CAPSULE | Freq: Three times a day (TID) | ORAL | 3 refills | Status: DC

## 2015-10-05 NOTE — Progress Notes (Signed)
Subjective:    Patient ID: [name], male    DOB: 1960/07/01,    Past Medical History:  Diagnosis Date  . Allergic rhinitis, cause unspecified   . Cervicalgia   . Chest pain Sept. 2005   multiple caths  /  cath 06/15/2010..normal coronaries,  EF 60%   (false postive nuclear in the past)  . Dyslipidemia    mixed  . Esophageal reflux   . Fibromyalgia   . Gastroparesis   . Headache(784.0)   . Hyperlipidemia   . Hypertension   . IDDM (insulin dependent diabetes mellitus) ([place])   . Lumbago   . Neuropathy associated with endocrine disorder ([place])   . Overweight(278.02)   . Peripheral vascular disease ([place])   . Pulmonary embolus (HCC)    Hx of small left lower lobe  pulmonary embolus  . Pulmonary embolus ([place]) 2010ish  . Stroke [facility])    mini stroke aug 2016  . 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 Urticaria, unspecified    Past Surgical History:  Procedure Laterality Date  . ACHILLES TENDON REPAIR Left 2013  . AMPUTATION Left 06/02/2013   Procedure: AMPUTATION 1ST TOE LEFT FOOT;  Surgeon: Marcheta Grammes, DPM;  Location: [facility];  Service: Orthopedics;  Laterality: Left;  . AMPUTATION Left 07/21/2013   Procedure: PARTIAL AMPUTATION 2ND TOE LEFT FOOT;  Surgeon: Marcheta Grammes, DPM;  Location: [facility];  Service: Podiatry;  Laterality: Left;  . APPENDECTOMY    . BACK SURGERY  1999   x3  . FOOT ARTHRODESIS Right 01/11/2014   Procedure: ARTHRODESIS INTERPHALANGEAL JOINT HALLUX RIGHT FOOT;  Surgeon: Marcheta Grammes, DPM;  Location: [facility];  Service: Podiatry;  Laterality: Right;  . HARDWARE REMOVAL Right 01/17/2015   Procedure: HARDWARE REMOVAL;  Surgeon: Caprice Beaver, DPM;  Location: [facility];  Service: Podiatry;  Laterality: Right;  . LUMBAR LAMINECTOMY/DECOMPRESSION  MICRODISCECTOMY Right 01/12/2013   Procedure: Right Lumbar Three-Four Laminotomy/Foraminotomy;  Surgeon: Floyce Stakes, MD;  Location: [facility];  Service: Neurosurgery;  Laterality: Right;  Right Lumbar Three-Four Laminotomy/Foraminotomy  . NISSEN FUNDOPLICATION    . SHOULDER ARTHROSCOPY W/ ROTATOR CUFF REPAIR Right    Social History   Social History  . Marital status: Married    Spouse name: N/A  . Number of children: 2  . Years of education: 10th   Occupational History  . Retired- Research officer, trade union    Social History Main Topics  . Smoking status: Former Smoker    Packs/day: 3.00    Years: 5.00    Types: Cigarettes    Start date: 06/20/1977    Quit date: 07/20/1982  . Smokeless tobacco: Never Used     Comment: quit 1980's  . Alcohol use No  . Drug use: No  . Sexual activity: Yes    Birth control/ protection: None   Other Topics Concern  . None   Social History Narrative  . None   Outpatient Encounter Prescriptions as of 10/05/2015  Medication Sig  . aspirin EC 81 MG tablet Take 81 mg by mouth daily.  [name] atorvastatin (LIPITOR) 20 MG tablet Take 20 mg by mouth daily.  [name] gabapentin (NEURONTIN) 300 MG capsule Take 1 capsule (300 mg total) by mouth 3 (three) times daily.  . insulin detemir (LEVEMIR) 100 UNIT/ML injection Inject 0.6 mLs (60  Units total) into the skin at bedtime.  . INVOKANA 100 MG TABS tablet TAKE 1 TABLET BY MOUTH EVERY DAY  . metFORMIN (GLUCOPHAGE) 500 MG tablet Take 1 tablet (500 mg total) by mouth 2 (two) times daily with a meal. 2 pills 2 times a day (Patient taking differently: Take 1,000 mg by mouth 2 (two) times daily with a meal. 2 pills 2 times a day)  . RANEXA 1000 MG SR tablet Take 1 tablet by mouth daily.  . [DISCONTINUED] gabapentin (NEURONTIN) 600 MG tablet TAKE 4 TABLETS BY MOUTH AT BEDTIME - REFILLS DENIED, PT TO CONTACT DR. Hiram Comber 09/24/2015-   No facility-administered encounter medications on file as of 10/05/2015.    ALLERGIES: Allergies   Allergen Reactions  . Reglan [Metoclopramide] Itching and Rash   VACCINATION STATUS: Immunization History  Administered Date(s) Administered  . Influenza,inj,Quad PF,36+ Mos 01/13/2013    Diabetes  He presents for his follow-up diabetic visit. He has type 2 diabetes mellitus. Onset time: He was diagnosed at approximate age of 51 years. His disease course has been stable. There are no hypoglycemic associated symptoms. Pertinent negatives for hypoglycemia include no confusion, pallor or seizures. There are no diabetic associated symptoms. Pertinent negatives for diabetes include no chest pain, no fatigue, no polydipsia, no polyphagia, no polyuria and no weakness. There are no hypoglycemic complications. Symptoms are stable. Diabetic complications include a CVA. Pertinent negatives for diabetic complications include no heart disease. Risk factors for coronary artery disease include diabetes mellitus, dyslipidemia, male sex, obesity and sedentary lifestyle. Current diabetic treatment includes insulin injections and oral agent (dual therapy) (Against advice, he used NovoLog 15 units at bedtime. This caused some tight fasting blood glucose readings to as low as 46.). He is compliant with treatment most of the time. His weight is decreasing steadily. He is following a diabetic diet. He has had a previous visit with a dietitian. He rarely participates in exercise. His home blood glucose trend is decreasing steadily. His breakfast blood glucose range is generally 130-140 mg/dl. An ACE inhibitor/angiotensin II receptor blocker is not being taken. He sees a podiatrist.Eye exam is current.  Hyperlipidemia  This is a chronic problem. The current episode started more than 1 year ago. Pertinent negatives include no chest pain, leg pain or myalgias. Current antihyperlipidemic treatment includes statins. Risk factors for coronary artery disease include diabetes mellitus, dyslipidemia, male sex, a sedentary lifestyle  and obesity.     Review of Systems  Constitutional: Negative for fatigue and unexpected weight change.  HENT: Negative for dental problem, mouth sores and trouble swallowing.   Eyes: Negative for visual disturbance.  Respiratory: Negative for cough, choking, chest tightness and wheezing.   Cardiovascular: Negative for chest pain and leg swelling.  Gastrointestinal: Negative for abdominal distention, abdominal pain, constipation, diarrhea, nausea and vomiting.  Endocrine: Negative for polydipsia, polyphagia and polyuria.  Genitourinary: Negative for dysuria, flank pain, hematuria and urgency.  Musculoskeletal: Negative for back pain, gait problem and myalgias.  Skin: Negative for pallor, rash and wound.  Neurological: Negative for seizures, syncope, weakness and numbness.  Psychiatric/Behavioral: Negative.  Negative for confusion and dysphoric mood.    Objective:    BP 136/76   Pulse 63   Ht 6\' 6"  (1.981 m)   Wt 272 lb (123.4 kg)   BMI 31.43 kg/m   Wt Readings from Last 3 Encounters:  10/05/15 272 lb (123.4 kg)  09/18/15 273 lb (123.8 kg)  06/20/15 280 lb (127 kg)    Physical  Exam  Constitutional: He is oriented to person, place, and time. He appears well-developed and well-nourished. He is cooperative. No distress.  HENT:  Head: Normocephalic and atraumatic.  Eyes: EOM are normal.  Neck: Normal range of motion. Neck supple. No tracheal deviation present. No thyromegaly present.  Cardiovascular: Normal rate, S1 normal, S2 normal and normal heart sounds.  Exam reveals no gallop.   No murmur heard. Pulses:      Dorsalis pedis pulses are 1+ on the right side, and 1+ on the left side.       Posterior tibial pulses are 1+ on the right side, and 1+ on the left side.  Pulmonary/Chest: Breath sounds normal. No respiratory distress. He has no wheezes.  Abdominal: Soft. Bowel sounds are normal. He exhibits no distension. There is no tenderness. There is no guarding and no CVA  tenderness.  Musculoskeletal: He exhibits no edema.       Right shoulder: He exhibits no swelling and no deformity.  Neurological: He is alert and oriented to person, place, and time. He has normal strength and normal reflexes. No cranial nerve deficit or sensory deficit. Gait normal.  Skin: Skin is warm and dry. No rash noted. No cyanosis. Nails show no clubbing.  Psychiatric: He has a normal mood and affect. His speech is normal and behavior is normal. Judgment and thought content normal. Cognition and memory are normal.    Results for orders placed or performed in visit on 09/27/15  COMPLETE METABOLIC PANEL WITH GFR  Result Value Ref Range   Sodium 139 135 - 146 mmol/L   Potassium 4.6 3.5 - 5.3 mmol/L   Chloride 107 98 - 110 mmol/L   CO2 18 (L) 20 - 31 mmol/L   Glucose, Bld 120 (H) 65 - 99 mg/dL   BUN 30 (H) 7 - 25 mg/dL   Creat 1.58 (H) 0.70 - 1.33 mg/dL   Total Bilirubin 0.4 0.2 - 1.2 mg/dL   Alkaline Phosphatase 89 40 - 115 U/L   AST 19 10 - 35 U/L   ALT 23 9 - 46 U/L   Total Protein 7.1 6.1 - 8.1 g/dL   Albumin 4.4 3.6 - 5.1 g/dL   Calcium 10.2 8.6 - 10.3 mg/dL   GFR, Est African American 56 (L) >=60 mL/min   GFR, Est Non African American 48 (L) >=60 mL/min  Hemoglobin A1c  Result Value Ref Range   Hgb A1c MFr Bld 7.8 (H) <5.7 %   Mean Plasma Glucose 177 mg/dL   Diabetic Labs (most recent): Lab Results  Component Value Date   HGBA1C 7.8 (H) 09/27/2015   HGBA1C 7.0 (H) 06/04/2015   HGBA1C 7.0 (H) 02/23/2015   Lipid Panel     Component Value Date/Time   CHOL 148 08/31/2014 0653   TRIG 139 08/31/2014 0653   HDL 32 (L) 08/31/2014 0653   CHOLHDL 4.6 08/31/2014 0653   VLDL 28 08/31/2014 0653   LDLCALC 88 08/31/2014 0653     Assessment & Plan:   1. Type 2 diabetes mellitus with vascular disease (HCC)  His diabetes is  complicated by CVA and patient remains at a high risk for more acute and chronic complications of diabetes which include CAD, CVA, CKD,  retinopathy, and neuropathy. These are all discussed in detail with the patient.  Patient came with significantly improved glucose profile, and  recent A1c of 7.8 % increasing from 7% last visit however generally improving from 8.5%.  Glucose logs and insulin administration records pertaining  to this visit,  to be scanned into patient's records.  Recent labs reviewed. He did not use NovoLog when blood glucose pre-meal was between 90 and 150 mg/dl.   - I have re-counseled the patient on diet management and weight loss  by adopting a carbohydrate restricted / protein rich  Diet.  - Suggestion is made for patient to avoid simple carbohydrates   from their diet including Cakes , Desserts, Ice Cream,  Soda (  diet and regular) , Sweet Tea , Candies,  Chips, Cookies, Artificial Sweeteners,   and "Sugar-free" Products .  This will help patient to have stable blood glucose profile and potentially avoid unintended  Weight gain.  - Patient is advised to stick to a routine mealtimes to eat 3 meals  a day and avoid unnecessary snacks ( to snack only to correct hypoglycemia).  - The patient  has been  scheduled with Jearld Fenton, RDN, CDE for individualized DM education.  - I have approached patient with the following individualized plan to manage diabetes and patient agrees.  -I will increase basal insulin Levemir to 60 units QHS, and hold Novolog for now. -I strictly warned him not to take NovoLog especially at bedtime. -He will continue to monitor blood glucose every morning before breakfast. -Patient is warned not to take insulin without proper monitoring per orders.  -Patient is encouraged to call clinic for blood glucose levels less than 70 or above 300 mg /dl.  -For better insulin sensitivity I will continue MTF 1gm po BID, and Invokana 100mg  po qam.  - Patient will be considered for incretin therapy as appropriate next visit. - Patient specific target  for A1c; LDL, HDL, Triglycerides, and   Waist Circumference were discussed in detail.  2) BP/HTN: Controlled, no medications currently. 3) Lipids/HPL:  continue statins. 4)  Weight/Diet: CDE consult in progress, exercise, and carbohydrates information provided.  5) Chronic Care/Health Maintenance:  -Patient is on Statin medications and encouraged to continue to follow up with Ophthalmology, Podiatrist at least yearly or according to recommendations, and advised to  stay away from smoking. I have recommended yearly flu vaccine and pneumonia vaccination at least every 5 years; moderate intensity exercise for up to 150 minutes weekly; and  sleep for at least 7 hours a day. - I have prescribed gabapentin 300 mg by mouth 3 times a day for diabetic neuropathy.  I advised patient to maintain close follow up with their PCP for primary care needs.  Patient is asked to bring meter and  blood glucose logs during their next visit.   Follow up plan: Return in about 3 months (around 01/04/2016) for follow up with pre-visit labs, meter, and logs.  Glade Lloyd, MD Phone: [phone]  Fax: [phone]   10/05/2015, 1:37 PM

## 2015-10-05 NOTE — Patient Instructions (Signed)

## 2015-10-19 ENCOUNTER — Other Ambulatory Visit: Payer: Self-pay | Admitting: "Endocrinology

## 2015-10-23 ENCOUNTER — Telehealth: Payer: Self-pay | Admitting: "Endocrinology

## 2015-10-23 MED ORDER — GABAPENTIN 600 MG PO TABS: 2400.0000 mg | ORAL_TABLET | Freq: Every day | ORAL | 3 refills | Status: DC

## 2015-10-23 NOTE — Telephone Encounter (Signed)
Patient requested refill on his Gabapentin at his last OV but [facility] has not received it.

## 2015-10-23 NOTE — Addendum Note (Signed)
Addended by: Lavell Luster A on: 10/23/2015 09:25 AM   Modules accepted: Orders

## 2015-12-18 ENCOUNTER — Other Ambulatory Visit: Payer: Self-pay | Admitting: "Endocrinology

## 2016-01-11 ENCOUNTER — Ambulatory Visit: Payer: Medicare HMO | Admitting: "Endocrinology

## 2016-02-12 DIAGNOSIS — M25561 Pain in right knee: Secondary | ICD-10-CM | POA: Diagnosis not present

## 2016-02-18 ENCOUNTER — Other Ambulatory Visit: Payer: Self-pay | Admitting: "Endocrinology

## 2016-02-18 ENCOUNTER — Ambulatory Visit: Payer: Medicare HMO | Admitting: "Endocrinology

## 2016-03-17 ENCOUNTER — Other Ambulatory Visit: Payer: Self-pay | Admitting: "Endocrinology

## 2016-03-28 IMAGING — CR DG FOOT COMPLETE 3+V*R*
1 series · 3 of 3 positions shown · non-contrast
Comparison: January 09, 2015.

CLINICAL DATA: Postoperative status.

EXAM:
RIGHT FOOT COMPLETE - 3+ VIEW

[Series 2: ap · 0.17mm/px · 3 of 3 slices shown]
[im 1/3]
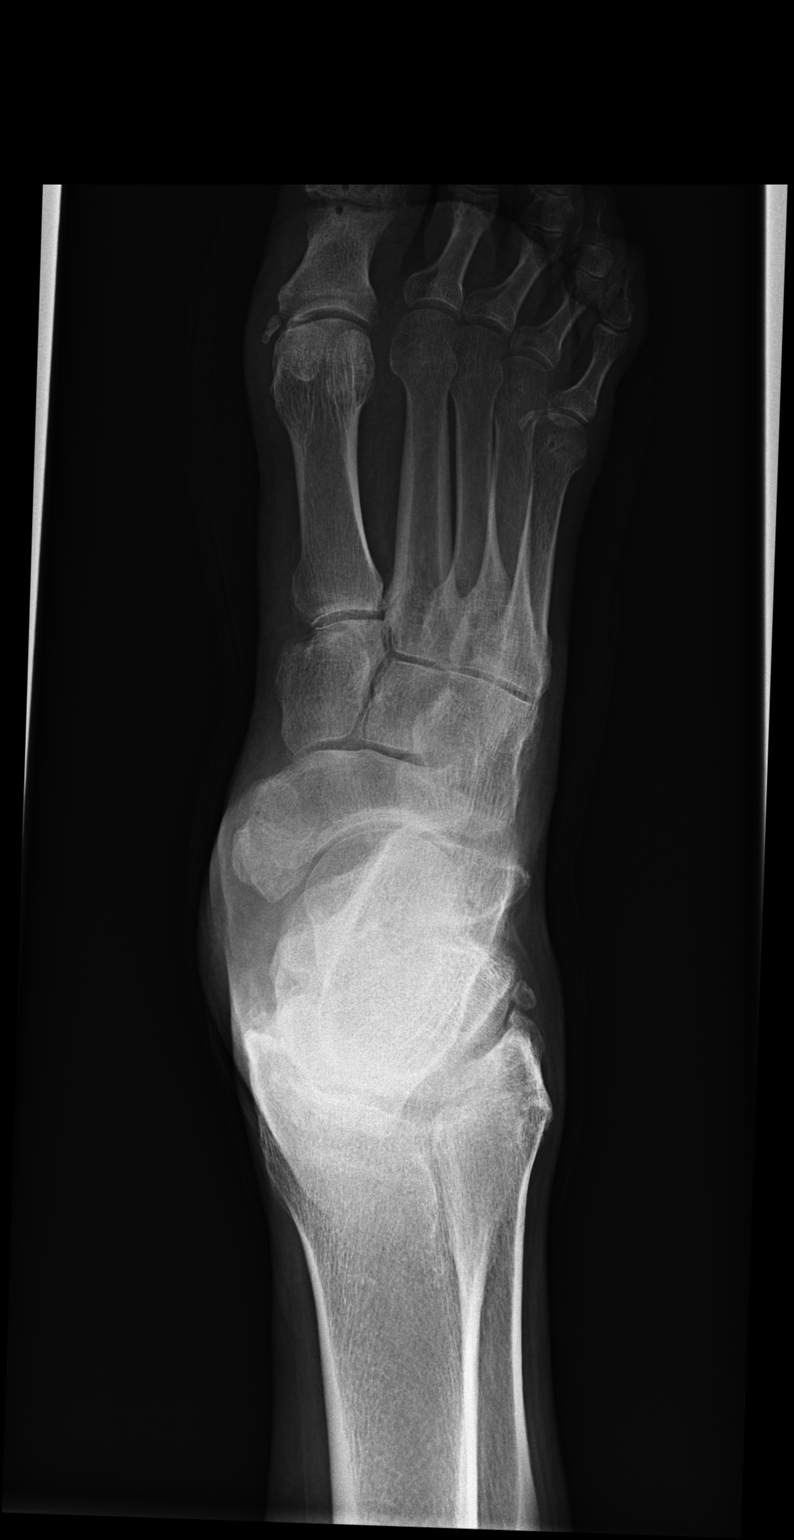
[im 2/3]
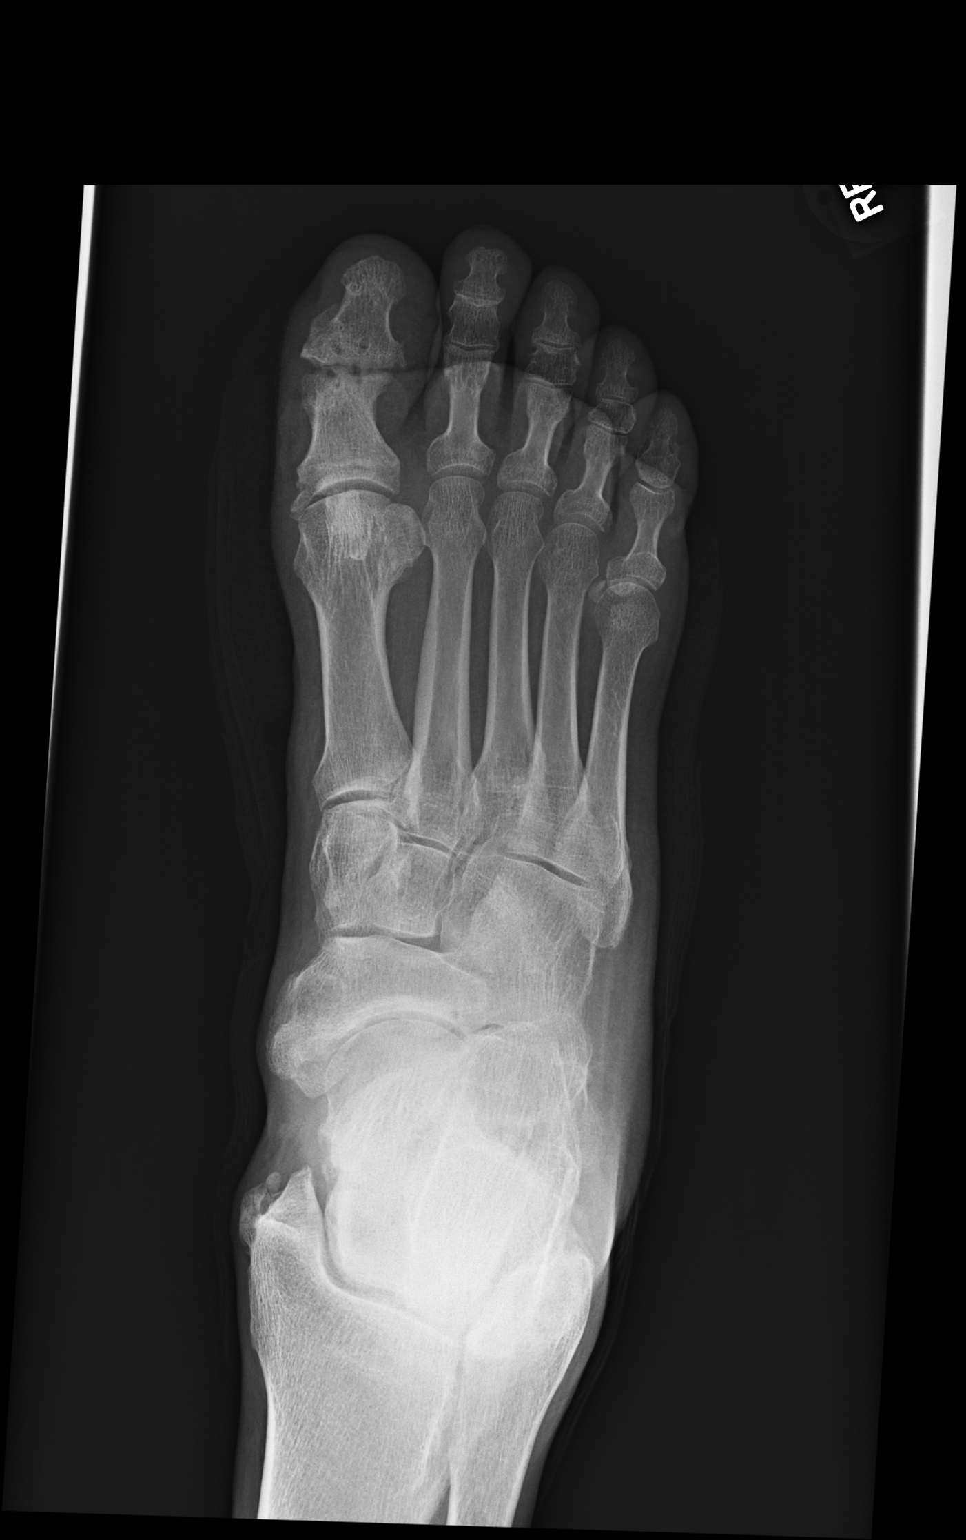
[im 3/3]
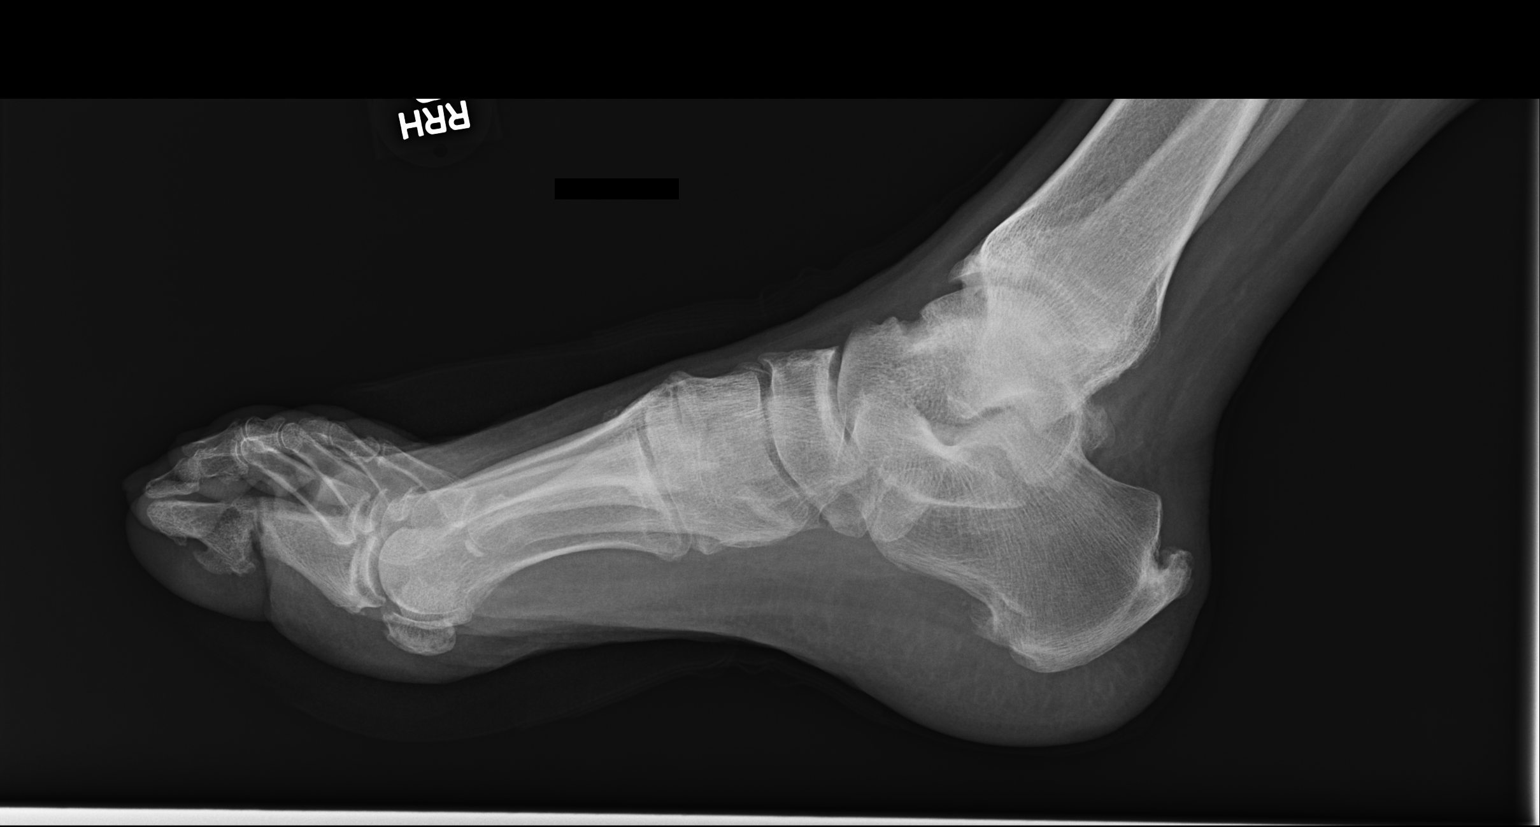

[3 of 3 positions shown; findings below may reference images not displayed]

FINDINGS: Surgical pins noted previously at first interphalangeal joint have
been removed. Persistent nonunion of this joint is noted. Spurring
of posterior calcaneus is noted. Mild degenerative changes seen
involving the first metatarsophalangeal joint. No acute fracture or
dislocation is noted. No soft tissue abnormality is noted.
IMPRESSION: Surgical pins noted previously of first interphalangeal joint have
been removed with persistent nonunion of this joint remaining. No
acute abnormality seen in the right foot.

## 2016-04-06 ENCOUNTER — Emergency Department (HOSPITAL_COMMUNITY)
Admission: EM | Admit: 2016-04-06 | Discharge: 2016-04-06 | Disposition: A | Discharge status: Home / Self Care | Payer: Medicare HMO | Attending: Emergency Medicine | Admitting: Emergency Medicine

## 2016-04-06 ENCOUNTER — Encounter (HOSPITAL_COMMUNITY): Payer: Self-pay | Admitting: Cardiology

## 2016-04-06 ENCOUNTER — Emergency Department (HOSPITAL_COMMUNITY): Payer: Medicare HMO

## 2016-04-06 DIAGNOSIS — S30811A Abrasion of abdominal wall, initial encounter: Secondary | ICD-10-CM | POA: Diagnosis not present

## 2016-04-06 DIAGNOSIS — W19XXXA Unspecified fall, initial encounter: Secondary | ICD-10-CM

## 2016-04-06 DIAGNOSIS — R300 Dysuria: Secondary | ICD-10-CM | POA: Insufficient documentation

## 2016-04-06 DIAGNOSIS — Z87891 Personal history of nicotine dependence: Secondary | ICD-10-CM | POA: Diagnosis not present

## 2016-04-06 DIAGNOSIS — Z7982 Long term (current) use of aspirin: Secondary | ICD-10-CM | POA: Insufficient documentation

## 2016-04-06 DIAGNOSIS — Z79899 Other long term (current) drug therapy: Secondary | ICD-10-CM | POA: Diagnosis not present

## 2016-04-06 DIAGNOSIS — Z7984 Long term (current) use of oral hypoglycemic drugs: Secondary | ICD-10-CM | POA: Insufficient documentation

## 2016-04-06 DIAGNOSIS — S301XXA Contusion of abdominal wall, initial encounter: Secondary | ICD-10-CM | POA: Diagnosis not present

## 2016-04-06 DIAGNOSIS — I10 Essential (primary) hypertension: Secondary | ICD-10-CM | POA: Diagnosis not present

## 2016-04-06 DIAGNOSIS — Y9222 Religious institution as the place of occurrence of the external cause: Secondary | ICD-10-CM | POA: Insufficient documentation

## 2016-04-06 DIAGNOSIS — Y999 Unspecified external cause status: Secondary | ICD-10-CM | POA: Diagnosis not present

## 2016-04-06 DIAGNOSIS — S0990XA Unspecified injury of head, initial encounter: Secondary | ICD-10-CM | POA: Insufficient documentation

## 2016-04-06 DIAGNOSIS — E119 Type 2 diabetes mellitus without complications: Secondary | ICD-10-CM | POA: Diagnosis not present

## 2016-04-06 DIAGNOSIS — R109 Unspecified abdominal pain: Secondary | ICD-10-CM | POA: Diagnosis not present

## 2016-04-06 DIAGNOSIS — Y939 Activity, unspecified: Secondary | ICD-10-CM | POA: Diagnosis not present

## 2016-04-06 DIAGNOSIS — W1809XA Striking against other object with subsequent fall, initial encounter: Secondary | ICD-10-CM | POA: Insufficient documentation

## 2016-04-06 DIAGNOSIS — S3991XA Unspecified injury of abdomen, initial encounter: Secondary | ICD-10-CM | POA: Diagnosis present

## 2016-04-06 DIAGNOSIS — S199XXA Unspecified injury of neck, initial encounter: Secondary | ICD-10-CM | POA: Diagnosis not present

## 2016-04-06 DIAGNOSIS — S069X9A Unspecified intracranial injury with loss of consciousness of unspecified duration, initial encounter: Secondary | ICD-10-CM | POA: Diagnosis not present

## 2016-04-06 LAB — CBC WITH DIFFERENTIAL/PLATELET
Basophils Absolute: 0.1 10*3/uL (ref 0.0–0.1)
Basophils Relative: 2 %
EOS PCT: 8 %
Eosinophils Absolute: 0.6 10*3/uL (ref 0.0–0.7)
HCT: 44.7 % (ref 39.0–52.0)
Hemoglobin: 15.6 g/dL (ref 13.0–17.0)
LYMPHS ABS: 2 10*3/uL (ref 0.7–4.0)
Lymphocytes Relative: 26 %
MCH: 29.2 pg (ref 26.0–34.0)
MCHC: 34.9 g/dL (ref 30.0–36.0)
MCV: 83.7 fL (ref 78.0–100.0)
Monocytes Absolute: 0.7 10*3/uL (ref 0.1–1.0)
Monocytes Relative: 9 %
Neutro Abs: 4.4 10*3/uL (ref 1.7–7.7)
Neutrophils Relative %: 55 %
PLATELETS: 167 10*3/uL (ref 150–400)
RBC: 5.34 MIL/uL (ref 4.22–5.81)
RDW: 13.8 % (ref 11.5–15.5)
WBC: 7.9 10*3/uL (ref 4.0–10.5)

## 2016-04-06 LAB — COMPREHENSIVE METABOLIC PANEL
ALT: 28 U/L (ref 17–63)
ANION GAP: 13 (ref 5–15)
AST: 36 U/L (ref 15–41)
Albumin: 4.3 g/dL (ref 3.5–5.0)
Alkaline Phosphatase: 97 U/L (ref 38–126)
BUN: 27 mg/dL — ABNORMAL HIGH (ref 6–20)
CHLORIDE: 105 mmol/L (ref 101–111)
CO2: 20 mmol/L — AB (ref 22–32)
Calcium: 9.8 mg/dL (ref 8.9–10.3)
Creatinine, Ser: 1.7 mg/dL — ABNORMAL HIGH (ref 0.61–1.24)
GFR calc non Af Amer: 43 mL/min — ABNORMAL LOW (ref >60)
GFR, EST AFRICAN AMERICAN: 50 mL/min — AB (ref >60)
Glucose, Bld: 232 mg/dL — ABNORMAL HIGH (ref 65–99)
Potassium: 4.7 mmol/L (ref 3.5–5.1)
SODIUM: 138 mmol/L (ref 135–145)
Total Bilirubin: 0.5 mg/dL (ref 0.3–1.2)
Total Protein: 7.9 g/dL (ref 6.5–8.1)

## 2016-04-06 MED ORDER — IOPAMIDOL (ISOVUE-300) INJECTION 61%
80.0000 mL | Freq: Once | INTRAVENOUS | Status: AC | PRN
  Administered 2016-04-06: 80 mL via INTRAVENOUS

## 2016-04-06 MED ORDER — TRAMADOL HCL 50 MG PO TABS: 50.0000 mg | ORAL_TABLET | Freq: Four times a day (QID) | ORAL | 0 refills | Status: DC | PRN

## 2016-04-06 NOTE — Discharge Instructions (Signed)
Follow up with your family md next week if problems °

## 2016-04-06 NOTE — ED Provider Notes (Signed)
[facility] DEPT Provider Note   CSN: [id number] Arrival date & time: 04/06/16  1333   By signing my name below, I, Charolotte Eke, attest that this documentation has been prepared under the direction and in the presence of Milton Ferguson, MD. Electronically Signed: Charolotte Eke, Scribe. 04/06/16. 1:55 PM.    History   Chief Complaint Chief Complaint  Patient presents with  . Fall    HPI [name] is a 56 y.o. male.  Pt has 3 slipped discs in his neck.   The history is provided by the patient. No language interpreter was used.  Flank Pain  This is a new problem. The current episode started 2 days ago (s/p fall). The problem occurs constantly. The problem has been gradually worsening. Associated symptoms include abdominal pain. Pertinent negatives include no chest pain, no headaches and no shortness of breath. Nothing aggravates the symptoms. Nothing relieves the symptoms. He has tried nothing for the symptoms.    Past Medical History:  Diagnosis Date  . Allergic rhinitis, cause unspecified   . Cervicalgia   . Chest pain Sept. 2005   multiple caths  /  cath 06/15/2010..normal coronaries,  EF 60%   (false postive nuclear in the past)  . Dyslipidemia    mixed  . Esophageal reflux   . Fibromyalgia   . Gastroparesis   . Headache(784.0)   . Hyperlipidemia   . Hypertension   . IDDM (insulin dependent diabetes mellitus) ([place])   . Lumbago   . Neuropathy associated with endocrine disorder ([place])   . Overweight(278.02)   . Peripheral vascular disease ([place])   . Pulmonary embolus (HCC)    Hx of small left lower lobe  pulmonary embolus  . Pulmonary embolus ([place]) 2010ish  . Stroke [facility])    mini stroke aug 2016  . Type II or unspecified type diabetes mellitus with neurological manifestations, not stated as uncontrolled(250.60)   . Type II or unspecified type diabetes mellitus with neurological manifestations, not stated as uncontrolled(250.60)   . Urticaria, unspecified      Patient Active Problem List   Diagnosis Date Noted  . TIA (transient ischemic attack) 08/30/2014  . 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 Neuropathy ([place]) 08/30/2014  . AKI (acute kidney injury) ([place]) 08/30/2014  . Bilateral occipital neuralgia 10/30/2013  . Lumbar stenosis without neurogenic claudication 01/12/2013  . Type 2 diabetes mellitus with vascular disease ([place])   . Hypertension   . Hyperlipidemia   . Gastroparesis   . Pulmonary embolus ([place])   . Obesity   . GERD 01/14/2010  . Chest pain 10/05/2003    Past Surgical History:  Procedure Laterality Date  . ACHILLES TENDON REPAIR Left 2013  . AMPUTATION Left 06/02/2013   Procedure: AMPUTATION 1ST TOE LEFT FOOT;  Surgeon: Marcheta Grammes, DPM;  Location: [facility];  Service: Orthopedics;  Laterality: Left;  . AMPUTATION Left 07/21/2013   Procedure: PARTIAL AMPUTATION 2ND TOE LEFT FOOT;  Surgeon: Marcheta Grammes, DPM;  Location: [facility];  Service: Podiatry;  Laterality: Left;  . APPENDECTOMY    . BACK SURGERY  1999   x3  . FOOT ARTHRODESIS Right 01/11/2014   Procedure: ARTHRODESIS INTERPHALANGEAL JOINT HALLUX RIGHT FOOT;  Surgeon: Marcheta Grammes, DPM;  Location: [facility];  Service: Podiatry;  Laterality: Right;  . HARDWARE REMOVAL Right 01/17/2015   Procedure: HARDWARE REMOVAL;  Surgeon: Caprice Beaver, DPM;  Location: [facility];  Service: Podiatry;  Laterality: Right;  . LUMBAR LAMINECTOMY/DECOMPRESSION MICRODISCECTOMY Right 01/12/2013  Procedure: Right Lumbar Three-Four Laminotomy/Foraminotomy;  Surgeon: Floyce Stakes, MD;  Location[facility];  Service: Neurosurgery;  Laterality: Right;  Right Lumbar Three-Four Laminotomy/Foraminotomy  . NISSEN FUNDOPLICATION    . SHOULDER ARTHROSCOPY W/ ROTATOR CUFF REPAIR Right        Home Medications    Prior to Admission medications   Medication Sig Start Date End Date Taking? Authorizing Provider  aspirin EC 81 MG tablet Take 81 mg by  mouth daily.    Historical Provider, MD  atorvastatin (LIPITOR) 20 MG tablet Take 20 mg by mouth daily.    Historical Provider, MD  gabapentin (NEURONTIN) 600 MG tablet TAKE 4 TABLETS BY MOUTH AT BEDTIME. 02/18/16   Cassandria Anger, MD  INVOKANA 100 MG TABS tablet TAKE 1 TABLET BY MOUTH EVERY DAY 03/17/16   Cassandria Anger, MD  LEVEMIR 100 UNIT/ML injection INJECT 60 UNITS INTO THE SKIN AT BEDTIME 03/17/16   Cassandria Anger, MD  metFORMIN (GLUCOPHAGE) 500 MG tablet Take 1 tablet (500 mg total) by mouth 2 (two) times daily with a meal. 2 pills 2 times a day Patient taking differently: Take 1,000 mg by mouth 2 (two) times daily with a meal. 2 pills 2 times a day 06/20/15   Cassandria Anger, MD  RANEXA 1000 MG SR tablet Take 1 tablet by mouth daily. 08/23/15   Historical Provider, MD    Family History Family History  Problem Relation Age of Onset  . Heart attack Father 67  . Hyperlipidemia Father   . Hypertension Father   . Heart attack Mother 1  . Diabetes Mother   . Hypertension Mother   . Hyperlipidemia Mother   . Healthy Brother   . Seizures Brother   . Migraines Neg Hx     Social History Social History  Substance Use Topics  . Smoking status: Former Smoker    Packs/day: 3.00    Years: 5.00    Types: Cigarettes    Start date: 06/20/1977    Quit date: 07/20/1982  . Smokeless tobacco: Never Used     Comment: quit 1980's  . Alcohol use No     Allergies   Reglan [metoclopramide]   Review of Systems Review of Systems  Respiratory: Negative for shortness of breath.   Cardiovascular: Negative for chest pain.  Gastrointestinal: Positive for abdominal pain.  Genitourinary: Positive for dysuria and flank pain.  Skin: Positive for color change.  Neurological: Negative for headaches.  All other systems reviewed and are negative.    Physical Exam Updated Vital Signs BP 149/97 (BP Location: Left Arm)   Pulse 103   Temp 98.3 F (36.8 C) (Oral)   Resp 18    Ht 6\' 5"  (1.956 m)   Wt 272 lb (123.4 kg)   SpO2 98%   BMI 32.25 kg/m   Physical Exam  Constitutional: He is oriented to person, place, and time. He appears well-developed.  HENT:  Head: Normocephalic.  Eyes: Conjunctivae and EOM are normal. No scleral icterus.  Neck: Neck supple. No thyromegaly present.  Cardiovascular: Normal rate and regular rhythm.  Exam reveals no gallop and no friction rub.   No murmur heard. Pulmonary/Chest: No stridor. He has no wheezes. He has no rales. He exhibits no tenderness.  Abdominal: He exhibits no distension. There is no tenderness. There is no rebound.  Musculoskeletal: Normal range of motion.  Right flank abrasion, tenderness, swelling, bruising.  Lymphadenopathy:    He has no cervical adenopathy.  Neurological: He  is oriented to person, place, and time. He exhibits normal muscle tone. Coordination normal.  Skin: No rash noted. No erythema.  Psychiatric: He has a normal mood and affect. His behavior is normal.     ED Treatments / Results   DIAGNOSTIC STUDIES: Oxygen Saturation is 98% on room air, normal by my interpretation.    COORDINATION OF CARE: 1:51 PM Discussed treatment plan with pt at bedside and pt agreed to plan, which includes XR.    Labs (all labs ordered are listed, but only abnormal results are displayed) Labs Reviewed - No data to display  EKG  EKG Interpretation None       Radiology No results found.  Procedures Procedures (including critical care time)  Medications Ordered in ED Medications - No data to display   Initial Impression / Assessment and Plan / ED Course  I have reviewed the triage vital signs and the nursing notes.  Pertinent labs & imaging results that were available during my care of the patient were reviewed by me and considered in my medical decision making (see chart for details).       Final Clinical Impressions(s) / ED Diagnoses   Final diagnoses:  None    New  Prescriptions New Prescriptions   No medications on file    Patient with contusion to his right flank after fall. CT scan is negative. Patient will follow-up as needed   Milton Ferguson, MD 04/06/16 2109

## 2016-04-06 NOTE — ED Triage Notes (Signed)
Fall Wednesday at [employer].  States the pews were not secured and he fell on his right flank area.  Bruising to right flank.  Also states he hit his head and everything went dark for a minute.  C/o painful urination since fall

## 2016-04-28 ENCOUNTER — Ambulatory Visit (INDEPENDENT_AMBULATORY_CARE_PROVIDER_SITE_OTHER): Payer: Medicare HMO | Admitting: Cardiovascular Disease

## 2016-04-28 ENCOUNTER — Encounter: Payer: Self-pay | Admitting: Cardiovascular Disease

## 2016-04-28 VITALS — BP 144/90 | HR 80 | Ht 78.0 in | Wt 276.0 lb

## 2016-04-28 DIAGNOSIS — E7800 Pure hypercholesterolemia, unspecified: Secondary | ICD-10-CM | POA: Diagnosis not present

## 2016-04-28 DIAGNOSIS — R030 Elevated blood-pressure reading, without diagnosis of hypertension: Secondary | ICD-10-CM | POA: Diagnosis not present

## 2016-04-28 DIAGNOSIS — R072 Precordial pain: Secondary | ICD-10-CM

## 2016-04-28 NOTE — Patient Instructions (Signed)
Your physician wants you to follow-up in: 1 YEAR WITH DR KONESWARAN You will receive a reminder letter in the mail two months in advance. If you don't receive a letter, please call our office to schedule the follow-up appointment.  Your physician recommends that you continue on your current medications as directed. Please refer to the Current Medication list given to you today.  Thank you for choosing [facility]!!    

## 2016-04-28 NOTE — Progress Notes (Signed)
SUBJECTIVE: The patient presents follow-up of chronic chest pain.  Nuclear stress test on 01/10/15 was low risk with no evidence of ischemia or scar, calculated LVEF 54%.  ECG performed in the office today which I personally reviewed demonstrates normal sinus rhythm with no ischemic ST segment or T-wave abnormalities, nor any arrhythmias.  He has been taking long-acting Ranexa 1000 mg daily. He said he had more frequent chest pain when he took 2000 mg. He denies exertional chest pain and shortness of breath. He also denies lightheadedness, dizziness, and leg swelling.    Review of Systems: As per "subjective", otherwise negative.  Allergies  Allergen Reactions  . Reglan [Metoclopramide] Itching and Rash    Current Outpatient Prescriptions  Medication Sig Dispense Refill  . aspirin EC 81 MG tablet Take 81 mg by mouth daily.    [name] atorvastatin (LIPITOR) 20 MG tablet Take 20 mg by mouth daily.    [name] gabapentin (NEURONTIN) 600 MG tablet TAKE 4 TABLETS BY MOUTH AT BEDTIME. 120 tablet 3  . INVOKANA 100 MG TABS tablet TAKE 1 TABLET BY MOUTH EVERY DAY 30 tablet 2  . LEVEMIR 100 UNIT/ML injection INJECT 60 UNITS INTO THE SKIN AT BEDTIME 20 mL 2  . metFORMIN (GLUCOPHAGE) 500 MG tablet Take 1 tablet (500 mg total) by mouth 2 (two) times daily with a meal. 2 pills 2 times a day (Patient taking differently: Take 1,000 mg by mouth 2 (two) times daily with a meal. 2 pills 2 times a day) 60 tablet 2  . RANEXA 1000 MG SR tablet Take 500 mg by mouth daily.     . traMADol (ULTRAM) 50 MG tablet Take 1 tablet (50 mg total) by mouth every 6 (six) hours as needed. 20 tablet 0   No current facility-administered medications for this visit.     Past Medical History:  Diagnosis Date  . Allergic rhinitis, cause unspecified   . Cervicalgia   . Chest pain Sept. 2005   multiple caths  /  cath 06/15/2010..normal coronaries,  EF 60%   (false postive nuclear in the past)  . Dyslipidemia    mixed  .  Esophageal reflux   . Fibromyalgia   . Gastroparesis   . Headache(784.0)   . Hyperlipidemia   . Hypertension   . IDDM (insulin dependent diabetes mellitus) ([place])   . Lumbago   . Neuropathy associated with endocrine disorder ([place])   . Overweight(278.02)   . Peripheral vascular disease ([place])   . Pulmonary embolus (HCC)    Hx of small left lower lobe  pulmonary embolus  . Pulmonary embolus ([place]) 2010ish  . Stroke [facility])    mini stroke aug 2016  . Type II or unspecified type diabetes mellitus with neurological manifestations, not stated as uncontrolled(250.60)   . Type II or unspecified type diabetes mellitus with neurological manifestations, not stated as uncontrolled(250.60)   . Urticaria, unspecified     Past Surgical History:  Procedure Laterality Date  . ACHILLES TENDON REPAIR Left 2013  . AMPUTATION Left 06/02/2013   Procedure: AMPUTATION 1ST TOE LEFT FOOT;  Surgeon: Marcheta Grammes, DPM;  Location: [facility];  Service: Orthopedics;  Laterality: Left;  . AMPUTATION Left 07/21/2013   Procedure: PARTIAL AMPUTATION 2ND TOE LEFT FOOT;  Surgeon: Marcheta Grammes, DPM;  Location: [facility];  Service: Podiatry;  Laterality: Left;  . APPENDECTOMY    . BACK SURGERY  1999   x3  . FOOT ARTHRODESIS Right 01/11/2014   Procedure:  ARTHRODESIS INTERPHALANGEAL JOINT HALLUX RIGHT FOOT;  Surgeon: Marcheta Grammes, DPM;  Location: [facility];  Service: Podiatry;  Laterality: Right;  . HARDWARE REMOVAL Right 01/17/2015   Procedure: HARDWARE REMOVAL;  Surgeon: Caprice Beaver, DPM;  Location: [facility];  Service: Podiatry;  Laterality: Right;  . LUMBAR LAMINECTOMY/DECOMPRESSION MICRODISCECTOMY Right 01/12/2013   Procedure: Right Lumbar Three-Four Laminotomy/Foraminotomy;  Surgeon: Floyce Stakes, MD;  [facility];  Service: Neurosurgery;  Laterality: Right;  Right Lumbar Three-Four Laminotomy/Foraminotomy  . NISSEN FUNDOPLICATION    . SHOULDER ARTHROSCOPY W/ ROTATOR CUFF REPAIR  Right     Social History   Social History  . Marital status: Married    Spouse name: N/A  . Number of children: 2  . Years of education: 10th   Occupational History  . Retired- Research officer, trade union    Social History Main Topics  . Smoking status: Former Smoker    Packs/day: 3.00    Years: 5.00    Types: Cigarettes    Start date: 06/20/1977    Quit date: 07/20/1982  . Smokeless tobacco: Never Used     Comment: quit 1980's  . Alcohol use No  . Drug use: No  . Sexual activity: Yes    Birth control/ protection: None   Other Topics Concern  . Not on file   Social History Narrative  . No narrative on file     Vitals:   04/28/16 1549  BP: (!) 144/90  Pulse: 80  SpO2: 98%  Weight: 276 lb (125.2 kg)  Height: 6\' 6"  (1.981 m)    PHYSICAL EXAM General: NAD HEENT: Normal. Neck: No JVD, no thyromegaly. Lungs: Clear to auscultation bilaterally with normal respiratory effort. CV: Nondisplaced PMI.  Regular rate and rhythm, normal S1/S2, no S3/S4, no murmur. No pretibial or periankle edema.  No carotid bruit.   Abdomen: Firm, obese. Neurologic: Alert and oriented.  Psych: Normal affect. Skin: Normal. Musculoskeletal: No gross deformities.    ECG: Most recent ECG reviewed.      ASSESSMENT AND PLAN:  1. Chest pain: Symptomatically stable. Long h/o chronic chest pains not deemed cardiac in etiology. No significant epicardial CAD by cath in 06/2010. Low risk Lexiscan Cardiolite stress test in 01/2015. He may have microvascular angina. Will continue Ranexa (he prefers to take long-acting 1000 mg daily).  2. Hyperlipidemia: On Lipitor 20 mg.  3. Elevated BP: He says it is usually normal. Needs monitoring.  Dispo: f/u 1 year.  Kate Sable, M.D., F.A.C.C.

## 2016-05-16 ENCOUNTER — Other Ambulatory Visit: Payer: Self-pay | Admitting: Cardiovascular Disease

## 2016-06-07 ENCOUNTER — Other Ambulatory Visit: Payer: Self-pay | Admitting: "Endocrinology

## 2016-07-07 DIAGNOSIS — G459 Transient cerebral ischemic attack, unspecified: Secondary | ICD-10-CM | POA: Diagnosis not present

## 2016-07-07 DIAGNOSIS — E785 Hyperlipidemia, unspecified: Secondary | ICD-10-CM | POA: Diagnosis not present

## 2016-07-07 DIAGNOSIS — Z299 Encounter for prophylactic measures, unspecified: Secondary | ICD-10-CM | POA: Diagnosis not present

## 2016-07-07 DIAGNOSIS — K3184 Gastroparesis: Secondary | ICD-10-CM | POA: Diagnosis not present

## 2016-07-07 DIAGNOSIS — E1143 Type 2 diabetes mellitus with diabetic autonomic (poly)neuropathy: Secondary | ICD-10-CM | POA: Diagnosis not present

## 2016-07-07 DIAGNOSIS — W57XXXA Bitten or stung by nonvenomous insect and other nonvenomous arthropods, initial encounter: Secondary | ICD-10-CM | POA: Diagnosis not present

## 2016-07-07 DIAGNOSIS — Z6833 Body mass index (BMI) 33.0-33.9, adult: Secondary | ICD-10-CM | POA: Diagnosis not present

## 2016-07-07 DIAGNOSIS — E1165 Type 2 diabetes mellitus with hyperglycemia: Secondary | ICD-10-CM | POA: Diagnosis not present

## 2016-07-07 DIAGNOSIS — E1142 Type 2 diabetes mellitus with diabetic polyneuropathy: Secondary | ICD-10-CM | POA: Diagnosis not present

## 2016-07-07 DIAGNOSIS — K219 Gastro-esophageal reflux disease without esophagitis: Secondary | ICD-10-CM | POA: Diagnosis not present

## 2016-07-14 ENCOUNTER — Encounter (HOSPITAL_COMMUNITY): Payer: Self-pay | Admitting: Emergency Medicine

## 2016-07-14 ENCOUNTER — Emergency Department (HOSPITAL_COMMUNITY): Payer: Medicare HMO

## 2016-07-14 ENCOUNTER — Other Ambulatory Visit: Payer: Self-pay

## 2016-07-14 ENCOUNTER — Emergency Department (HOSPITAL_COMMUNITY)
Admission: EM | Admit: 2016-07-14 | Discharge: 2016-07-14 | Disposition: A | Discharge status: Home / Self Care | Payer: Medicare HMO | Attending: Emergency Medicine | Admitting: Emergency Medicine

## 2016-07-14 DIAGNOSIS — R072 Precordial pain: Secondary | ICD-10-CM | POA: Diagnosis present

## 2016-07-14 DIAGNOSIS — R42 Dizziness and giddiness: Secondary | ICD-10-CM | POA: Diagnosis not present

## 2016-07-14 DIAGNOSIS — Z7984 Long term (current) use of oral hypoglycemic drugs: Secondary | ICD-10-CM | POA: Insufficient documentation

## 2016-07-14 DIAGNOSIS — R079 Chest pain, unspecified: Secondary | ICD-10-CM

## 2016-07-14 DIAGNOSIS — Z87891 Personal history of nicotine dependence: Secondary | ICD-10-CM | POA: Insufficient documentation

## 2016-07-14 DIAGNOSIS — E119 Type 2 diabetes mellitus without complications: Secondary | ICD-10-CM | POA: Insufficient documentation

## 2016-07-14 DIAGNOSIS — R110 Nausea: Secondary | ICD-10-CM | POA: Insufficient documentation

## 2016-07-14 DIAGNOSIS — I10 Essential (primary) hypertension: Secondary | ICD-10-CM | POA: Insufficient documentation

## 2016-07-14 DIAGNOSIS — Z7982 Long term (current) use of aspirin: Secondary | ICD-10-CM | POA: Insufficient documentation

## 2016-07-14 LAB — COMPREHENSIVE METABOLIC PANEL
ALBUMIN: 4 g/dL (ref 3.5–5.0)
ALT: 24 U/L (ref 17–63)
ANION GAP: 14 (ref 5–15)
AST: 31 U/L (ref 15–41)
Alkaline Phosphatase: 100 U/L (ref 38–126)
BUN: 28 mg/dL — ABNORMAL HIGH (ref 6–20)
CO2: 20 mmol/L — AB (ref 22–32)
Calcium: 9.3 mg/dL (ref 8.9–10.3)
Chloride: 106 mmol/L (ref 101–111)
Creatinine, Ser: 1.51 mg/dL — ABNORMAL HIGH (ref 0.61–1.24)
GFR calc Af Amer: 57 mL/min — ABNORMAL LOW (ref >60)
GFR calc non Af Amer: 50 mL/min — ABNORMAL LOW (ref >60)
GLUCOSE: 115 mg/dL — AB (ref 65–99)
POTASSIUM: 4.3 mmol/L (ref 3.5–5.1)
SODIUM: 140 mmol/L (ref 135–145)
Total Bilirubin: 0.6 mg/dL (ref 0.3–1.2)
Total Protein: 7.3 g/dL (ref 6.5–8.1)

## 2016-07-14 LAB — TROPONIN I
Troponin I: <0.03 ng/mL (ref <0.03)
Troponin I: <0.03 ng/mL (ref <0.03)

## 2016-07-14 LAB — CBC
HCT: 44.8 % (ref 39.0–52.0)
HEMOGLOBIN: 15.3 g/dL (ref 13.0–17.0)
MCH: 29.3 pg (ref 26.0–34.0)
MCHC: 34.2 g/dL (ref 30.0–36.0)
MCV: 85.8 fL (ref 78.0–100.0)
Platelets: 165 10*3/uL (ref 150–400)
RBC: 5.22 MIL/uL (ref 4.22–5.81)
RDW: 14 % (ref 11.5–15.5)
WBC: 9.3 10*3/uL (ref 4.0–10.5)

## 2016-07-14 MED ORDER — KETOROLAC TROMETHAMINE 30 MG/ML IJ SOLN
15.0000 mg | Freq: Once | INTRAMUSCULAR | Status: AC
  Administered 2016-07-14: 15 mg via INTRAVENOUS
  Filled 2016-07-14: qty 1

## 2016-07-14 MED ORDER — IBUPROFEN 400 MG PO TABS: 400.0000 mg | ORAL_TABLET | Freq: Three times a day (TID) | ORAL | 0 refills | Status: AC

## 2016-07-14 MED ORDER — ASPIRIN 81 MG PO CHEW
324.0000 mg | CHEWABLE_TABLET | Freq: Once | ORAL | Status: AC
Start: 2016-07-14 — End: 2016-07-14
  Administered 2016-07-14: 324 mg via ORAL
  Filled 2016-07-14: qty 4

## 2016-07-14 NOTE — ED Provider Notes (Signed)
[facility] DEPT Provider Note   CSN: [id number] Arrival date & time: 07/14/16  1212     History   Chief Complaint Chief Complaint  Patient presents with  . Chest Pain    HPI [name] is a 56 y.o. male.   Chest Pain   This is a new problem. The current episode started 2 days ago. The problem occurs hourly. The problem has not changed since onset.The pain is associated with breathing. The pain is present in the substernal region. The pain is at a severity of 8/10. The pain is moderate. The quality of the pain is described as brief and sharp. The pain does not radiate. Duration of episode(s) is 2 seconds. The symptoms are aggravated by deep breathing. Associated symptoms include dizziness (not assocaited with cp) and nausea (not associated with CP). Pertinent negatives include no abdominal pain, no back pain, no cough, no diaphoresis, no fever and no numbness.    Past Medical History:  Diagnosis Date  . Allergic rhinitis, cause unspecified   . Cervicalgia   . Chest pain Sept. 2005   multiple caths  /  cath 06/15/2010..normal coronaries,  EF 60%   (false postive nuclear in the past)  . Dyslipidemia    mixed  . Esophageal reflux   . Fibromyalgia   . Gastroparesis   . Headache(784.0)   . Hyperlipidemia   . Hypertension   . IDDM (insulin dependent diabetes mellitus) ([place])   . Lumbago   . Neuropathy associated with endocrine disorder ([place])   . Overweight(278.02)   . Peripheral vascular disease ([place])   . Pulmonary embolus (HCC)    Hx of small left lower lobe  pulmonary embolus  . Pulmonary embolus ([place]) 2010ish  . Stroke [facility])    mini stroke aug 2016  . Type II or unspecified type diabetes mellitus with neurological manifestations, not stated as uncontrolled(250.60)   . Type II or unspecified type diabetes mellitus with neurological manifestations, not stated as uncontrolled(250.60)   . Urticaria, unspecified     Patient Active Problem List   Diagnosis Date  Noted  . TIA (transient ischemic attack) 08/30/2014  . 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 Neuropathy 08/30/2014  . AKI (acute kidney injury) ([place]) 08/30/2014  . Bilateral occipital neuralgia 10/30/2013  . Lumbar stenosis without neurogenic claudication 01/12/2013  . Type 2 diabetes mellitus with vascular disease ([place])   . Hypertension   . Hyperlipidemia   . Gastroparesis   . Pulmonary embolus ([place])   . Obesity   . GERD 01/14/2010  . Chest pain 10/05/2003    Past Surgical History:  Procedure Laterality Date  . ACHILLES TENDON REPAIR Left 2013  . AMPUTATION Left 06/02/2013   Procedure: AMPUTATION 1ST TOE LEFT FOOT;  Surgeon: Marcheta Grammes, DPM;  Location: [facility];  Service: Orthopedics;  Laterality: Left;  . AMPUTATION Left 07/21/2013   Procedure: PARTIAL AMPUTATION 2ND TOE LEFT FOOT;  Surgeon: Marcheta Grammes, DPM;  Location: [facility];  Service: Podiatry;  Laterality: Left;  . APPENDECTOMY    . BACK SURGERY  1999   x3  . FOOT ARTHRODESIS Right 01/11/2014   Procedure: ARTHRODESIS INTERPHALANGEAL JOINT HALLUX RIGHT FOOT;  Surgeon: Marcheta Grammes, DPM;  Location: [facility];  Service: Podiatry;  Laterality: Right;  . HARDWARE REMOVAL Right 01/17/2015   Procedure: HARDWARE REMOVAL;  Surgeon: Caprice Beaver, DPM;  Location: [facility];  Service: Podiatry;  Laterality: Right;  . LUMBAR LAMINECTOMY/DECOMPRESSION MICRODISCECTOMY Right 01/12/2013   Procedure: Right  Lumbar Three-Four Laminotomy/Foraminotomy;  Surgeon: Floyce Stakes, MD;  Location: [facility];  Service: Neurosurgery;  Laterality: Right;  Right Lumbar Three-Four Laminotomy/Foraminotomy  . NISSEN FUNDOPLICATION    . SHOULDER ARTHROSCOPY W/ ROTATOR CUFF REPAIR Right        Home Medications    Prior to Admission medications   Medication Sig Start Date End Date Taking? Authorizing Provider  aspirin EC 81 MG tablet Take 81 mg by mouth daily.   Yes [provider]  atorvastatin  (LIPITOR) 20 MG tablet Take 20 mg by mouth daily.   Yes [provider]  canagliflozin (INVOKANA) 100 MG TABS tablet Take 1 tablet (100 mg total) by mouth daily. 06/09/16  Yes Nida, Marella Chimes, MD  doxycycline (VIBRA-TABS) 100 MG tablet Take 100 mg by mouth 2 (two) times daily. 07/07/16  Yes [provider]  gabapentin (NEURONTIN) 600 MG tablet TAKE 4 TABLETS BY MOUTH AT BEDTIME. 02/18/16  Yes Nida, Marella Chimes, MD  LEVEMIR 100 UNIT/ML injection INJECT 60 UNITS INTO THE SKIN AT BEDTIME 03/17/16  Yes Nida, Marella Chimes, MD  metFORMIN (GLUCOPHAGE) 500 MG tablet Take 1 tablet (500 mg total) by mouth 2 (two) times daily with a meal. 2 pills 2 times a day Patient taking differently: Take 1,000 mg by mouth 2 (two) times daily with a meal. 2 pills 2 times a day 06/20/15  Yes Nida, Marella Chimes, MD  RANEXA 1000 MG SR tablet TAKE 1 TABLET BY MOUTH DAILY (DOSE DECREASE) 05/19/16  Yes Herminio Commons, MD  ibuprofen (ADVIL,MOTRIN) 400 MG tablet Take 1 tablet (400 mg total) by mouth 3 (three) times daily. 07/14/16 07/19/16  Kyleena Scheirer, Corene Cornea, MD    Family History Family History  Problem Relation Age of Onset  . Heart attack Father 6  . Hyperlipidemia Father   . Hypertension Father   . Heart attack Mother 80  . Diabetes Mother   . Hypertension Mother   . Hyperlipidemia Mother   . Healthy Brother   . Seizures Brother   . Migraines Neg Hx     Social History Social History  Substance Use Topics  . Smoking status: Former Smoker    Packs/day: 3.00    Years: 5.00    Types: Cigarettes    Start date: 06/20/1977    Quit date: 07/20/1982  . Smokeless tobacco: Never Used     Comment: quit 1980's  . Alcohol use No     Allergies   Reglan [metoclopramide]   Review of Systems Review of Systems  Constitutional: Negative for diaphoresis and fever.  Respiratory: Negative for cough.   Cardiovascular: Positive for chest pain.  Gastrointestinal: Positive for nausea (not  associated with CP). Negative for abdominal pain.  Musculoskeletal: Negative for back pain.  Neurological: Positive for dizziness (not assocaited with cp). Negative for numbness.  All other systems reviewed and are negative.    Physical Exam Updated Vital Signs BP 128/85 (BP Location: Left Arm)   Pulse 61   Temp 97.8 F (36.6 C) (Oral)   Resp 18   Ht 6\' 5"  (1.956 m)   Wt 124.3 kg (274 lb)   SpO2 99%   BMI 32.49 kg/m   Physical Exam  Constitutional: He is oriented to person, place, and time. He appears well-developed and well-nourished.  HENT:  Head: Normocephalic and atraumatic.  Eyes: Conjunctivae and EOM are normal.  Neck: Normal range of motion.  Cardiovascular: Normal rate.   Pulmonary/Chest: Effort normal. No respiratory distress. He exhibits tenderness (reproducible).  Abdominal: Soft. He exhibits no distension.  Musculoskeletal: Normal range of motion.  Neurological: He is alert and oriented to person, place, and time. No cranial nerve deficit.  Skin: Skin is warm and dry. No rash noted.  Nursing note and vitals reviewed.    ED Treatments / Results  Labs (all labs ordered are listed, but only abnormal results are displayed) Labs Reviewed  COMPREHENSIVE METABOLIC PANEL - Abnormal; Notable for the following:       Result Value   CO2 20 (*)    Glucose, Bld 115 (*)    BUN 28 (*)    Creatinine, Ser 1.51 (*)    GFR calc non Af Amer 50 (*)    GFR calc Af Amer 57 (*)    All other components within normal limits  CBC  TROPONIN I  TROPONIN I    EKG  EKG Interpretation  Date/Time:  Monday July 14 2016 12:22:13 EDT Ventricular Rate:  89 PR Interval:  170 QRS Duration: 78 QT Interval:  362 QTC Calculation: 440 R Axis:   50 Text Interpretation:  Normal sinus rhythm Normal ECG Technically poor tracing Confirmed by Merrily Pew [phone]) on 07/14/2016 3:16:13 PM       EKG Interpretation  Date/Time:  Monday July 14 2016 17:08:57 EDT Ventricular Rate:  58 PR  Interval:  170 QRS Duration: 82 QT Interval:  411 QTC Calculation: 404 R Axis:   53 Text Interpretation:  Sinus rhythm Prolonged PR interval ST changes inferior leads similar to multiple ECGs dating back to 2014 No significant change since last tracing Confirmed by Merrily Pew [phone]) on 07/14/2016 5:13:24 PM        Radiology Dg Chest 2 View  Result Date: 07/14/2016 CLINICAL DATA:  Left chest pain EXAM: CHEST  2 VIEW COMPARISON:  12/18/2014 FINDINGS: Lungs are clear.  No pleural effusion or pneumothorax. The heart is normal in size. Visualized osseous structures are within normal limits. IMPRESSION: Normal chest radiographs. Electronically Signed   By: Julian Hy M.D.   On: 07/14/2016 12:40    Procedures Procedures (including critical care time)  Medications Ordered in ED Medications  ketorolac (TORADOL) 30 MG/ML injection 15 mg (15 mg Intravenous Given 07/14/16 1537)  aspirin chewable tablet 324 mg (324 mg Oral Given 07/14/16 1544)     Initial Impression / Assessment and Plan / ED Course  I have reviewed the triage vital signs and the nursing notes.  Pertinent labs & imaging results that were available during my care of the patient were reviewed by me and considered in my medical decision making (see chart for details).    Suspect MSK. Especially with 2 days of symptoms, negative troponin, stable ECG, unlikely ACS.  Equal pulses, normal mediastinum on CXR, no neurologic deficits, doubt dissection.  Unlikely to be PE with normal vitals, no leg swelling, hypoxia or sob.  Will give toradol/delta toponin/serial ecg's with likely PCP follow up . Repeat evaluation with almost complete resolution of symptoms with toradol. Repeat troponin ok. Awaiting repeat ECG, but if unremarkable, will be dc to follow up with PCP and take NSAIDs.   Final Clinical Impressions(s) / ED Diagnoses   Final diagnoses:  Nonspecific chest pain    New Prescriptions Discharge Medication List as  of 07/14/2016  5:14 PM    START taking these medications   Details  ibuprofen (ADVIL,MOTRIN) 400 MG tablet Take 1 tablet (400 mg total) by mouth 3 (three) times daily., Starting Mon 07/14/2016, Until Sat 07/19/2016, Print  Ahniyah Giancola, Corene Cornea, MD 07/15/16 [phone]

## 2016-07-14 NOTE — ED Triage Notes (Signed)
PT c/o left sided chest pressure with pain to left arm and nausea intermittently x2 days.

## 2016-08-28 ENCOUNTER — Other Ambulatory Visit: Payer: Self-pay | Admitting: "Endocrinology

## 2016-09-30 ENCOUNTER — Other Ambulatory Visit: Payer: Self-pay | Admitting: "Endocrinology

## 2016-11-06 ENCOUNTER — Other Ambulatory Visit (HOSPITAL_COMMUNITY): Payer: Self-pay | Admitting: Physician Assistant

## 2016-11-06 DIAGNOSIS — M5412 Radiculopathy, cervical region: Secondary | ICD-10-CM | POA: Diagnosis not present

## 2016-11-14 ENCOUNTER — Ambulatory Visit (HOSPITAL_COMMUNITY)
Admission: RE | Admit: 2016-11-14 | Discharge: 2016-11-14 | Disposition: A | Discharge status: Home / Self Care | Payer: Medicare HMO | Source: Ambulatory Visit | Attending: Physician Assistant | Admitting: Physician Assistant

## 2016-11-14 DIAGNOSIS — M50222 Other cervical disc displacement at C5-C6 level: Secondary | ICD-10-CM | POA: Diagnosis not present

## 2016-11-14 DIAGNOSIS — M47812 Spondylosis without myelopathy or radiculopathy, cervical region: Secondary | ICD-10-CM | POA: Insufficient documentation

## 2016-11-14 DIAGNOSIS — M542 Cervicalgia: Secondary | ICD-10-CM | POA: Diagnosis not present

## 2016-11-14 DIAGNOSIS — M5412 Radiculopathy, cervical region: Secondary | ICD-10-CM | POA: Diagnosis not present

## 2016-12-04 ENCOUNTER — Other Ambulatory Visit: Payer: Self-pay | Admitting: Neurological Surgery

## 2016-12-04 DIAGNOSIS — Z6832 Body mass index (BMI) 32.0-32.9, adult: Secondary | ICD-10-CM | POA: Diagnosis not present

## 2016-12-04 DIAGNOSIS — M5412 Radiculopathy, cervical region: Secondary | ICD-10-CM | POA: Diagnosis not present

## 2016-12-04 DIAGNOSIS — R030 Elevated blood-pressure reading, without diagnosis of hypertension: Secondary | ICD-10-CM | POA: Diagnosis not present

## 2016-12-04 DIAGNOSIS — M4722 Other spondylosis with radiculopathy, cervical region: Secondary | ICD-10-CM | POA: Diagnosis not present

## 2016-12-23 NOTE — Pre-Procedure Instructions (Addendum)
Parthiv Mucci [place]  12/23/2016      [facility] - [place], [place], [place] [address] [place] [address] Phone: [phone] Fax: [phone]    Your procedure is scheduled on November 26  Report to [facility] at 1100 A.M.  Call this number if you have problems the morning of surgery:  [phone]   Remember:  Do not eat food or drink liquids after midnight.  Continue all medications as directed by your physician except follow these medication instructions before surgery below   Take these medicines the morning of surgery with A SIP OF WATER NONE  7 days prior to surgery STOP taking any Aspirin(unless otherwise instructed by your surgeon), Aleve, Naproxen, Ibuprofen, Motrin, Advil, Goody's, BC's, all herbal medications, fish oil, and all vitamins  Follow your doctors instructions regarding your Aspirin.  If no instructions were given by your doctor, then you will need to call the prescribing office office to get instructions.     WHAT DO I DO ABOUT MY DIABETES MEDICATION?   [name] Do not take oral diabetes medicines (pills) the morning of surgery. canagliflozin (INVOKANA)  metFORMIN (GLUCOPHAGE)   . THE NIGHT BEFORE SURGERY, take ___30________ units of _LEVEMIR __________insulin.       How to Manage Your Diabetes Before and After Surgery  Why is it important to control my blood sugar before and after surgery? . Improving blood sugar levels before and after surgery helps healing and can limit problems. . A way of improving blood sugar control is eating a healthy diet by: o  Eating less sugar and carbohydrates o  Increasing activity/exercise o  Talking with your doctor about reaching your blood sugar goals . High blood sugars (greater than 180 mg/dL) can raise your risk of infections and slow your recovery, so you will need to focus on controlling your diabetes during the weeks before surgery. . Make sure that the doctor  who takes care of your diabetes knows about your planned surgery including the date and location.  How do I manage my blood sugar before surgery? . Check your blood sugar at least 4 times a day, starting 2 days before surgery, to make sure that the level is not too high or low. o Check your blood sugar the morning of your surgery when you wake up and every 2 hours until you get to the Short Stay unit. . If your blood sugar is less than 70 mg/dL, you will need to treat for low blood sugar: o Do not take insulin. o Treat a low blood sugar (less than 70 mg/dL) with  cup of clear juice (cranberry or apple), 4 glucose tablets, OR glucose gel. o Recheck blood sugar in 15 minutes after treatment (to make sure it is greater than 70 mg/dL). If your blood sugar is not greater than 70 mg/dL on recheck, call [phone] for further instructions. . Report your blood sugar to the short stay nurse when you get to Short Stay.  . If you are admitted to the hospital after surgery: o Your blood sugar will be checked by the staff and you will probably be given insulin after surgery (instead of oral diabetes medicines) to make sure you have good blood sugar levels. o The goal for blood sugar control after surgery is 80-180 mg/dL.    Do not wear jewelry  Do not wear lotions, powders, or cologne, or deoderant.   Men may shave  face and neck.  Do not bring valuables to the hospital.  [facility] is not responsible for any belongings or valuables.  Contacts, dentures or bridgework may not be worn into surgery.  Leave your suitcase in the car.  After surgery it may be brought to your room.  For patients admitted to the hospital, discharge time will be determined by your treatment team.  Patients discharged the day of surgery will not be allowed to drive home.    Special instructions:   [facility]- Preparing For Surgery  Before surgery, you can play an important role. Because skin is not sterile, your skin  needs to be as free of germs as possible. You can reduce the number of germs on your skin by washing with CHG (chlorahexidine gluconate) Soap before surgery.  CHG is an antiseptic cleaner which kills germs and bonds with the skin to continue killing germs even after washing.  Please do not use if you have an allergy to CHG or antibacterial soaps. If your skin becomes reddened/irritated stop using the CHG.  Do not shave (including legs and underarms) for at least 48 hours prior to first CHG shower. It is OK to shave your face.  Please follow these instructions carefully.   1. Shower the NIGHT BEFORE SURGERY and the MORNING OF SURGERY with CHG.   2. If you chose to wash your hair, wash your hair first as usual with your normal shampoo.  3. After you shampoo, rinse your hair and body thoroughly to remove the shampoo.  4. Use CHG as you would any other liquid soap. You can apply CHG directly to the skin and wash gently with a scrungie or a clean washcloth.   5. Apply the CHG Soap to your body ONLY FROM THE NECK DOWN.  Do not use on open wounds or open sores. Avoid contact with your eyes, ears, mouth and genitals (private parts). Wash Face and genitals (private parts)  with your normal soap.  6. Wash thoroughly, paying special attention to the area where your surgery will be performed.  7. Thoroughly rinse your body with warm water from the neck down.  8. DO NOT shower/wash with your normal soap after using and rinsing off the CHG Soap.  9. Pat yourself dry with a CLEAN TOWEL.  10. Wear CLEAN PAJAMAS to bed the night before surgery, wear comfortable clothes the morning of surgery  11. Place CLEAN SHEETS on your bed the night of your first shower and DO NOT SLEEP WITH PETS.    Day of Surgery: Do not apply any deodorants/lotions. Please wear clean clothes to the hospital/surgery center.      Please read over the following fact sheets that you were given.

## 2016-12-24 ENCOUNTER — Encounter (HOSPITAL_COMMUNITY): Payer: Self-pay

## 2016-12-24 ENCOUNTER — Encounter (HOSPITAL_COMMUNITY)
Admission: RE | Admit: 2016-12-24 | Discharge: 2016-12-24 | Disposition: A | Discharge status: Home / Self Care | Payer: Medicare HMO | Source: Ambulatory Visit | Attending: Neurological Surgery | Admitting: Neurological Surgery

## 2016-12-24 ENCOUNTER — Other Ambulatory Visit: Payer: Self-pay

## 2016-12-24 DIAGNOSIS — Z01812 Encounter for preprocedural laboratory examination: Secondary | ICD-10-CM | POA: Insufficient documentation

## 2016-12-24 DIAGNOSIS — Z86711 Personal history of pulmonary embolism: Secondary | ICD-10-CM | POA: Insufficient documentation

## 2016-12-24 DIAGNOSIS — I739 Peripheral vascular disease, unspecified: Secondary | ICD-10-CM | POA: Diagnosis not present

## 2016-12-24 DIAGNOSIS — E119 Type 2 diabetes mellitus without complications: Secondary | ICD-10-CM | POA: Insufficient documentation

## 2016-12-24 DIAGNOSIS — I10 Essential (primary) hypertension: Secondary | ICD-10-CM | POA: Diagnosis not present

## 2016-12-24 DIAGNOSIS — Z8673 Personal history of transient ischemic attack (TIA), and cerebral infarction without residual deficits: Secondary | ICD-10-CM | POA: Insufficient documentation

## 2016-12-24 DIAGNOSIS — Z87891 Personal history of nicotine dependence: Secondary | ICD-10-CM | POA: Insufficient documentation

## 2016-12-24 DIAGNOSIS — K219 Gastro-esophageal reflux disease without esophagitis: Secondary | ICD-10-CM | POA: Insufficient documentation

## 2016-12-24 DIAGNOSIS — E785 Hyperlipidemia, unspecified: Secondary | ICD-10-CM | POA: Insufficient documentation

## 2016-12-24 HISTORY — DX: Personal history of urinary calculi: Z87.442

## 2016-12-24 LAB — CBC
HEMATOCRIT: 41.7 % (ref 39.0–52.0)
HEMOGLOBIN: 14.5 g/dL (ref 13.0–17.0)
MCH: 29.4 pg (ref 26.0–34.0)
MCHC: 34.8 g/dL (ref 30.0–36.0)
MCV: 84.4 fL (ref 78.0–100.0)
Platelets: 145 10*3/uL — ABNORMAL LOW (ref 150–400)
RBC: 4.94 MIL/uL (ref 4.22–5.81)
RDW: 13.6 % (ref 11.5–15.5)
WBC: 5.3 10*3/uL (ref 4.0–10.5)

## 2016-12-24 LAB — BASIC METABOLIC PANEL
ANION GAP: 8 (ref 5–15)
BUN: 23 mg/dL — ABNORMAL HIGH (ref 6–20)
CALCIUM: 9.4 mg/dL (ref 8.9–10.3)
CO2: 21 mmol/L — ABNORMAL LOW (ref 22–32)
CREATININE: 1.71 mg/dL — AB (ref 0.61–1.24)
Chloride: 105 mmol/L (ref 101–111)
GFR, EST AFRICAN AMERICAN: 49 mL/min — AB (ref >60)
GFR, EST NON AFRICAN AMERICAN: 43 mL/min — AB (ref >60)
Glucose, Bld: 386 mg/dL — ABNORMAL HIGH (ref 65–99)
Potassium: 4.2 mmol/L (ref 3.5–5.1)
SODIUM: 134 mmol/L — AB (ref 135–145)

## 2016-12-24 LAB — TYPE AND SCREEN
ABO/RH(D): O POS
Antibody Screen: NEGATIVE

## 2016-12-24 LAB — GLUCOSE, CAPILLARY: GLUCOSE-CAPILLARY: 366 mg/dL — AB (ref 65–99)

## 2016-12-24 LAB — SURGICAL PCR SCREEN
MRSA, PCR: NEGATIVE
Staphylococcus aureus: NEGATIVE

## 2016-12-24 LAB — HEMOGLOBIN A1C
HEMOGLOBIN A1C: 8.9 % — AB (ref 4.8–5.6)
MEAN PLASMA GLUCOSE: 208.73 mg/dL

## 2016-12-24 LAB — ABO/RH: ABO/RH(D): O POS

## 2016-12-24 NOTE — Progress Notes (Signed)
PCP - Dr. Arsenio Katz Cardiologist - Dr. Bronson Ing  Chest x-ray - 07/14/2016 EKG - 07/21/2016 Stress Test - 2016 ECHO - 2016 Cardiac Cath -   Sleep Study - patient denies CPAP - n/a  Fasting Blood Sugar - 130-150 Checks Blood Sugar 3 times a day  Blood Thinner Instructions: Aspirin Instructions: stopped asa 2 weeks ago  Anesthesia review: yes, cardiac hx; CBG 366 at PAT appointment  Patient denies shortness of breath, fever, cough and chest pain at PAT appointment   Patient verbalized understanding of instructions that were given to them at the PAT appointment. Patient was also instructed that they will need to review over the PAT instructions again at home before surgery.

## 2016-12-26 MED ORDER — DEXTROSE 5 % IV SOLN
3.0000 g | INTRAVENOUS | Status: AC
  Administered 2016-12-29: 3 g via INTRAVENOUS
  Filled 2016-12-26: qty 3

## 2016-12-26 NOTE — Progress Notes (Signed)
Anesthesia Chart Review:  Pt is a 56 year old male scheduled for C5-6, C6-7 ACDF and plate fixation on 07/30/9483 with Cyndy Freeze, M.D.  - PCP is Arsenio Katz, NP in [place].  - Cardiologist is Kate Sable, MD. Last office visit 04/28/16, 1 year f/u recommended.   PMH includes: HTN, DM, dyslipidemia, PE, PVD, stroke, GERD. Former smoker. BMI 32.  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   Medications include: ASA 81 mg, Lipitor, invokana, Levemir, metformin, Ranexa. Pt stopped ASA 2 weeks ago.   BP (!) 143/82   Pulse 72   Temp 36.8 C (Oral)   Resp 18   Ht 6\' 5"  (1.956 m)   Wt 271 lb 9.7 oz (123.2 kg)   SpO2 98%   BMI 32.21 kg/m   Preoperative labs reviewed.   - HbA1c 8.9, glucose 386 - Cr 1.71, BUN 23.  This is consistent with recent prior results.   CXR 07/14/16: Normal chest radiographs  EKG 07/14/16: Sinus rhythm. Prolonged PR interval. ST changes inferior leads similar to multiple ECGs dating back to 2014  Nuclear stress test 01/10/15:   There was no ST segment deviation noted during stress.  This is a low risk study. No ischemia or myocardial scar.  Nuclear stress EF: 54%. Normal regional wall motion.  Defect 1: There is a defect present in the basal inferior, basal inferolateral and mid inferior location, consistent with soft tissue attenuation artifact.  Echo 08/31/14:  - Left ventricle: Inferior basal hypokinesis The cavity size was normal. Wall thickness was increased in a pattern of moderate LVH. Systolic function was normal. The estimated ejection fraction was in the range of 60% to 65%. Wall motion was normal; there were no regional wall motion abnormalities. Left ventricular diastolic function parameters were normal.  Carotid duplex 08/31/14:  - Minimal amount of bilateral atherosclerotic plaque, left subjectively greater than right,  not resulting in a hemodynamically significant stenosis.  Cardiac cath 07/03/10:  1. No significant obstructive atherosclerotic coronary artery disease (OM3 40% stenosis). 2. Normal left ventricular function.  I attempted to contact Dr. Hewitt Shorts office to notify him of pt's uncontrolled DM, however the office is closed for the holiday.    I spoke with pt by telephone.  Fasting glucose this morning 160.  I instructed pt to take DM meds as prescribed and be extra careful with diet until surgery.   If glucose acceptable day of surgery, I anticipate pt can proceed as scheduled.   Willeen Cass, FNP-BC [facility]/Anesthesiology Phone: [phone] 12/26/2016 10:43 AM

## 2016-12-29 ENCOUNTER — Encounter (HOSPITAL_COMMUNITY)
Admission: RE | Disposition: A | Discharge status: Home / Self Care | Payer: Self-pay | Source: Ambulatory Visit | Attending: Neurological Surgery

## 2016-12-29 ENCOUNTER — Ambulatory Visit (HOSPITAL_COMMUNITY): Payer: Medicare HMO | Admitting: Certified Registered Nurse Anesthetist

## 2016-12-29 ENCOUNTER — Ambulatory Visit (HOSPITAL_COMMUNITY): Payer: Medicare HMO

## 2016-12-29 ENCOUNTER — Ambulatory Visit (HOSPITAL_COMMUNITY)
Admission: RE | Admit: 2016-12-29 | Discharge: 2016-12-30 | Disposition: A | Discharge status: Home / Self Care | Payer: Medicare HMO | Source: Ambulatory Visit | Attending: Neurological Surgery | Admitting: Neurological Surgery

## 2016-12-29 ENCOUNTER — Ambulatory Visit (HOSPITAL_COMMUNITY): Payer: Medicare HMO | Admitting: Vascular Surgery

## 2016-12-29 ENCOUNTER — Encounter (HOSPITAL_COMMUNITY): Payer: Self-pay | Admitting: *Deleted

## 2016-12-29 DIAGNOSIS — Z87891 Personal history of nicotine dependence: Secondary | ICD-10-CM | POA: Diagnosis not present

## 2016-12-29 DIAGNOSIS — Z86711 Personal history of pulmonary embolism: Secondary | ICD-10-CM | POA: Diagnosis not present

## 2016-12-29 DIAGNOSIS — Z419 Encounter for procedure for purposes other than remedying health state, unspecified: Secondary | ICD-10-CM

## 2016-12-29 DIAGNOSIS — Z794 Long term (current) use of insulin: Secondary | ICD-10-CM | POA: Diagnosis not present

## 2016-12-29 DIAGNOSIS — Z7982 Long term (current) use of aspirin: Secondary | ICD-10-CM | POA: Diagnosis not present

## 2016-12-29 DIAGNOSIS — E1151 Type 2 diabetes mellitus with diabetic peripheral angiopathy without gangrene: Secondary | ICD-10-CM | POA: Diagnosis not present

## 2016-12-29 DIAGNOSIS — Z8673 Personal history of transient ischemic attack (TIA), and cerebral infarction without residual deficits: Secondary | ICD-10-CM | POA: Insufficient documentation

## 2016-12-29 DIAGNOSIS — M50123 Cervical disc disorder at C6-C7 level with radiculopathy: Secondary | ICD-10-CM | POA: Diagnosis not present

## 2016-12-29 DIAGNOSIS — Z87442 Personal history of urinary calculi: Secondary | ICD-10-CM | POA: Insufficient documentation

## 2016-12-29 DIAGNOSIS — K219 Gastro-esophageal reflux disease without esophagitis: Secondary | ICD-10-CM | POA: Insufficient documentation

## 2016-12-29 DIAGNOSIS — M797 Fibromyalgia: Secondary | ICD-10-CM | POA: Insufficient documentation

## 2016-12-29 DIAGNOSIS — E1143 Type 2 diabetes mellitus with diabetic autonomic (poly)neuropathy: Secondary | ICD-10-CM | POA: Insufficient documentation

## 2016-12-29 DIAGNOSIS — M4722 Other spondylosis with radiculopathy, cervical region: Secondary | ICD-10-CM | POA: Insufficient documentation

## 2016-12-29 DIAGNOSIS — Z79899 Other long term (current) drug therapy: Secondary | ICD-10-CM | POA: Diagnosis not present

## 2016-12-29 DIAGNOSIS — E785 Hyperlipidemia, unspecified: Secondary | ICD-10-CM | POA: Diagnosis not present

## 2016-12-29 DIAGNOSIS — I10 Essential (primary) hypertension: Secondary | ICD-10-CM | POA: Diagnosis not present

## 2016-12-29 DIAGNOSIS — M50122 Cervical disc disorder at C5-C6 level with radiculopathy: Secondary | ICD-10-CM | POA: Diagnosis not present

## 2016-12-29 DIAGNOSIS — M4322 Fusion of spine, cervical region: Secondary | ICD-10-CM | POA: Diagnosis not present

## 2016-12-29 HISTORY — PX: ANTERIOR CERVICAL DECOMP/DISCECTOMY FUSION: SHX1161

## 2016-12-29 LAB — GLUCOSE, CAPILLARY
GLUCOSE-CAPILLARY: 209 mg/dL — AB (ref 65–99)
Glucose-Capillary: 150 mg/dL — ABNORMAL HIGH (ref 65–99)
Glucose-Capillary: 144 mg/dL — ABNORMAL HIGH (ref 65–99)
Glucose-Capillary: 225 mg/dL — ABNORMAL HIGH (ref 65–99)

## 2016-12-29 SURGERY — ANTERIOR CERVICAL DECOMPRESSION/DISCECTOMY FUSION 2 LEVELS
Anesthesia: General | Site: Spine Cervical

## 2016-12-29 MED ORDER — DEXAMETHASONE 4 MG PO TABS
4.0000 mg | ORAL_TABLET | Freq: Four times a day (QID) | ORAL | Status: AC
  Administered 2016-12-29 (×3): 4 mg via ORAL
  Filled 2016-12-29 (×2): qty 1

## 2016-12-29 MED ORDER — PROPOFOL 10 MG/ML IV BOLUS
INTRAVENOUS | Status: AC
  Filled 2016-12-29: qty 20

## 2016-12-29 MED ORDER — LIDOCAINE-EPINEPHRINE 2 %-1:100000 IJ SOLN
INTRAMUSCULAR | Status: DC | PRN
  Administered 2016-12-29: 10 mL

## 2016-12-29 MED ORDER — PROMETHAZINE HCL 25 MG/ML IJ SOLN: 6.2500 mg | INTRAMUSCULAR | Status: DC | PRN

## 2016-12-29 MED ORDER — PHENOL 1.4 % MT LIQD: 1.0000 | OROMUCOSAL | Status: DC | PRN

## 2016-12-29 MED ORDER — SODIUM CHLORIDE 0.9 % IV SOLN
INTRAVENOUS | Status: DC
  Administered 2016-12-29: 15:00:00 via INTRAVENOUS

## 2016-12-29 MED ORDER — SODIUM CHLORIDE 0.9% FLUSH
3.0000 mL | Freq: Two times a day (BID) | INTRAVENOUS | Status: DC
  Administered 2016-12-29 (×2): 3 mL via INTRAVENOUS

## 2016-12-29 MED ORDER — BISACODYL 10 MG RE SUPP: 10.0000 mg | Freq: Every day | RECTAL | Status: DC | PRN

## 2016-12-29 MED ORDER — HYDROMORPHONE HCL 1 MG/ML IJ SOLN
0.2500 mg | INTRAMUSCULAR | Status: DC | PRN
  Administered 2016-12-29: 0.5 mg via INTRAVENOUS

## 2016-12-29 MED ORDER — OXYCODONE HCL 5 MG PO TABS: 5.0000 mg | ORAL_TABLET | ORAL | Status: DC | PRN

## 2016-12-29 MED ORDER — RANOLAZINE ER 500 MG PO TB12
1000.0000 mg | ORAL_TABLET | Freq: Every day | ORAL | Status: DC
  Administered 2016-12-29: 1000 mg via ORAL
  Filled 2016-12-29 (×2): qty 2

## 2016-12-29 MED ORDER — MIDAZOLAM HCL 5 MG/5ML IJ SOLN
INTRAMUSCULAR | Status: DC | PRN
  Administered 2016-12-29: 2 mg via INTRAVENOUS

## 2016-12-29 MED ORDER — HYDROMORPHONE HCL 1 MG/ML IJ SOLN
INTRAMUSCULAR | Status: AC
  Filled 2016-12-29: qty 1

## 2016-12-29 MED ORDER — GABAPENTIN 600 MG PO TABS
2400.0000 mg | ORAL_TABLET | Freq: Every day | ORAL | Status: DC
  Administered 2016-12-29: 2400 mg via ORAL
  Filled 2016-12-29: qty 4

## 2016-12-29 MED ORDER — ZOLPIDEM TARTRATE 5 MG PO TABS: 5.0000 mg | ORAL_TABLET | Freq: Every evening | ORAL | Status: DC | PRN

## 2016-12-29 MED ORDER — CHLORHEXIDINE GLUCONATE CLOTH 2 % EX PADS: 6.0000 | MEDICATED_PAD | Freq: Once | CUTANEOUS | Status: DC

## 2016-12-29 MED ORDER — FENTANYL CITRATE (PF) 250 MCG/5ML IJ SOLN
INTRAMUSCULAR | Status: AC
  Filled 2016-12-29: qty 5

## 2016-12-29 MED ORDER — OXYCODONE HCL ER 15 MG PO T12A
15.0000 mg | EXTENDED_RELEASE_TABLET | Freq: Two times a day (BID) | ORAL | Status: DC
  Administered 2016-12-29: 15 mg via ORAL
  Filled 2016-12-29: qty 1

## 2016-12-29 MED ORDER — SUGAMMADEX SODIUM 500 MG/5ML IV SOLN
INTRAVENOUS | Status: DC | PRN
  Administered 2016-12-29: 300 mg via INTRAVENOUS

## 2016-12-29 MED ORDER — SODIUM CHLORIDE 0.9 % IR SOLN
Status: DC | PRN
  Administered 2016-12-29: 500 mL

## 2016-12-29 MED ORDER — ACETAMINOPHEN 500 MG PO TABS
1000.0000 mg | ORAL_TABLET | Freq: Four times a day (QID) | ORAL | Status: DC
  Administered 2016-12-29 – 2016-12-30 (×2): 1000 mg via ORAL
  Filled 2016-12-29 (×2): qty 2

## 2016-12-29 MED ORDER — DOCUSATE SODIUM 100 MG PO CAPS
100.0000 mg | ORAL_CAPSULE | Freq: Two times a day (BID) | ORAL | Status: DC
  Administered 2016-12-29 (×2): 100 mg via ORAL
  Filled 2016-12-29 (×2): qty 1

## 2016-12-29 MED ORDER — MENTHOL 3 MG MT LOZG: 1.0000 | LOZENGE | OROMUCOSAL | Status: DC | PRN

## 2016-12-29 MED ORDER — MIDAZOLAM HCL 2 MG/2ML IJ SOLN
INTRAMUSCULAR | Status: AC
  Filled 2016-12-29: qty 2

## 2016-12-29 MED ORDER — THROMBIN (RECOMBINANT) 5000 UNITS EX SOLR
CUTANEOUS | Status: AC
  Filled 2016-12-29: qty 5000

## 2016-12-29 MED ORDER — FLEET ENEMA 7-19 GM/118ML RE ENEM: 1.0000 | ENEMA | Freq: Once | RECTAL | Status: DC | PRN

## 2016-12-29 MED ORDER — PHENYLEPHRINE HCL 10 MG/ML IJ SOLN
INTRAVENOUS | Status: DC | PRN
  Administered 2016-12-29: 15 ug/min via INTRAVENOUS

## 2016-12-29 MED ORDER — DEXAMETHASONE SODIUM PHOSPHATE 10 MG/ML IJ SOLN
INTRAMUSCULAR | Status: DC | PRN
  Administered 2016-12-29: 4 mg via INTRAVENOUS

## 2016-12-29 MED ORDER — INSULIN ASPART 100 UNIT/ML ~~LOC~~ SOLN
0.0000 [IU] | Freq: Three times a day (TID) | SUBCUTANEOUS | Status: DC
  Administered 2016-12-29: 7 [IU] via SUBCUTANEOUS
  Administered 2016-12-30: 11 [IU] via SUBCUTANEOUS

## 2016-12-29 MED ORDER — METFORMIN HCL 500 MG PO TABS
1000.0000 mg | ORAL_TABLET | Freq: Two times a day (BID) | ORAL | Status: DC
  Administered 2016-12-29 – 2016-12-30 (×2): 1000 mg via ORAL
  Filled 2016-12-29 (×2): qty 2

## 2016-12-29 MED ORDER — PHENYLEPHRINE HCL 10 MG/ML IJ SOLN
INTRAMUSCULAR | Status: AC
  Filled 2016-12-29: qty 1

## 2016-12-29 MED ORDER — DIAZEPAM 5 MG PO TABS: 5.0000 mg | ORAL_TABLET | Freq: Four times a day (QID) | ORAL | Status: DC | PRN

## 2016-12-29 MED ORDER — HYDROXYZINE HCL 50 MG/ML IM SOLN: 50.0000 mg | Freq: Four times a day (QID) | INTRAMUSCULAR | Status: DC | PRN

## 2016-12-29 MED ORDER — OXYCODONE HCL 5 MG PO TABS
10.0000 mg | ORAL_TABLET | ORAL | Status: DC | PRN
  Administered 2016-12-29: 10 mg via ORAL
  Filled 2016-12-29: qty 2

## 2016-12-29 MED ORDER — LACTATED RINGERS IV SOLN
INTRAVENOUS | Status: DC
  Administered 2016-12-29 (×3): via INTRAVENOUS

## 2016-12-29 MED ORDER — CEFAZOLIN SODIUM-DEXTROSE 1-4 GM/50ML-% IV SOLN
1.0000 g | Freq: Three times a day (TID) | INTRAVENOUS | Status: AC
  Administered 2016-12-29 – 2016-12-30 (×2): 1 g via INTRAVENOUS
  Filled 2016-12-29 (×2): qty 50

## 2016-12-29 MED ORDER — INSULIN DETEMIR 100 UNIT/ML ~~LOC~~ SOLN
60.0000 [IU] | Freq: Every day | SUBCUTANEOUS | Status: DC
  Administered 2016-12-29: 60 [IU] via SUBCUTANEOUS
  Filled 2016-12-29: qty 0.6

## 2016-12-29 MED ORDER — ONDANSETRON HCL 4 MG/2ML IJ SOLN
INTRAMUSCULAR | Status: AC
  Filled 2016-12-29: qty 2

## 2016-12-29 MED ORDER — PHENYLEPHRINE 40 MCG/ML (10ML) SYRINGE FOR IV PUSH (FOR BLOOD PRESSURE SUPPORT)
PREFILLED_SYRINGE | INTRAVENOUS | Status: AC
  Filled 2016-12-29: qty 10

## 2016-12-29 MED ORDER — SODIUM CHLORIDE 0.9% FLUSH: 3.0000 mL | INTRAVENOUS | Status: DC | PRN

## 2016-12-29 MED ORDER — DEXAMETHASONE SODIUM PHOSPHATE 4 MG/ML IJ SOLN: 2.0000 mg | Freq: Four times a day (QID) | INTRAMUSCULAR | Status: AC

## 2016-12-29 MED ORDER — BUPIVACAINE-EPINEPHRINE (PF) 0.5% -1:200000 IJ SOLN
INTRAMUSCULAR | Status: AC
  Filled 2016-12-29: qty 30

## 2016-12-29 MED ORDER — LIDOCAINE-EPINEPHRINE 2 %-1:100000 IJ SOLN
INTRAMUSCULAR | Status: AC
  Filled 2016-12-29: qty 1

## 2016-12-29 MED ORDER — PROPOFOL 10 MG/ML IV BOLUS
INTRAVENOUS | Status: DC | PRN
  Administered 2016-12-29: 200 mg via INTRAVENOUS

## 2016-12-29 MED ORDER — METHOCARBAMOL 750 MG PO TABS
750.0000 mg | ORAL_TABLET | Freq: Four times a day (QID) | ORAL | Status: DC
  Administered 2016-12-29 (×2): 750 mg via ORAL
  Filled 2016-12-29 (×2): qty 1

## 2016-12-29 MED ORDER — ATORVASTATIN CALCIUM 20 MG PO TABS
20.0000 mg | ORAL_TABLET | Freq: Every day | ORAL | Status: DC
  Administered 2016-12-29: 20 mg via ORAL
  Filled 2016-12-29: qty 1

## 2016-12-29 MED ORDER — SENNA 8.6 MG PO TABS
1.0000 | ORAL_TABLET | Freq: Two times a day (BID) | ORAL | Status: DC
  Administered 2016-12-29: 8.6 mg via ORAL
  Filled 2016-12-29: qty 1

## 2016-12-29 MED ORDER — 0.9 % SODIUM CHLORIDE (POUR BTL) OPTIME
TOPICAL | Status: DC | PRN
  Administered 2016-12-29: 1000 mL

## 2016-12-29 MED ORDER — CELECOXIB 200 MG PO CAPS
200.0000 mg | ORAL_CAPSULE | Freq: Two times a day (BID) | ORAL | Status: DC
  Administered 2016-12-29: 200 mg via ORAL
  Filled 2016-12-29: qty 1

## 2016-12-29 MED ORDER — THROMBIN (RECOMBINANT) 5000 UNITS EX SOLR
CUTANEOUS | Status: DC | PRN
  Administered 2016-12-29 (×2): 5 mL via TOPICAL

## 2016-12-29 MED ORDER — MEPERIDINE HCL 25 MG/ML IJ SOLN: 6.2500 mg | INTRAMUSCULAR | Status: DC | PRN

## 2016-12-29 MED ORDER — BUPIVACAINE-EPINEPHRINE (PF) 0.5% -1:200000 IJ SOLN
INTRAMUSCULAR | Status: DC | PRN
  Administered 2016-12-29: 10 mL

## 2016-12-29 MED ORDER — ASPIRIN EC 81 MG PO TBEC: 81.0000 mg | DELAYED_RELEASE_TABLET | Freq: Every day | ORAL | Status: DC

## 2016-12-29 MED ORDER — FENTANYL CITRATE (PF) 100 MCG/2ML IJ SOLN
INTRAMUSCULAR | Status: DC | PRN
  Administered 2016-12-29: 50 ug via INTRAVENOUS
  Administered 2016-12-29: 250 ug via INTRAVENOUS
  Administered 2016-12-29: 50 ug via INTRAVENOUS

## 2016-12-29 MED ORDER — LIDOCAINE HCL (CARDIAC) 20 MG/ML IV SOLN
INTRAVENOUS | Status: DC | PRN
  Administered 2016-12-29: 40 mg via INTRAVENOUS

## 2016-12-29 MED ORDER — DEXAMETHASONE SODIUM PHOSPHATE 10 MG/ML IJ SOLN
INTRAMUSCULAR | Status: AC
  Filled 2016-12-29: qty 1

## 2016-12-29 MED ORDER — ONDANSETRON HCL 4 MG PO TABS: 4.0000 mg | ORAL_TABLET | Freq: Four times a day (QID) | ORAL | Status: DC | PRN

## 2016-12-29 MED ORDER — PANTOPRAZOLE SODIUM 40 MG IV SOLR
40.0000 mg | Freq: Every day | INTRAVENOUS | Status: DC
  Administered 2016-12-29: 40 mg via INTRAVENOUS
  Filled 2016-12-29 (×2): qty 40

## 2016-12-29 MED ORDER — MIDAZOLAM HCL 2 MG/2ML IJ SOLN: 0.5000 mg | Freq: Once | INTRAMUSCULAR | Status: DC | PRN

## 2016-12-29 MED ORDER — ROCURONIUM BROMIDE 100 MG/10ML IV SOLN
INTRAVENOUS | Status: DC | PRN
  Administered 2016-12-29: 20 mg via INTRAVENOUS
  Administered 2016-12-29: 15 mg via INTRAVENOUS
  Administered 2016-12-29: 50 mg via INTRAVENOUS
  Administered 2016-12-29: 10 mg via INTRAVENOUS

## 2016-12-29 MED ORDER — ONDANSETRON HCL 4 MG/2ML IJ SOLN
4.0000 mg | Freq: Four times a day (QID) | INTRAMUSCULAR | Status: DC | PRN
  Administered 2016-12-29: 4 mg via INTRAVENOUS
  Filled 2016-12-29: qty 2

## 2016-12-29 MED ORDER — ONDANSETRON HCL 4 MG/2ML IJ SOLN
INTRAMUSCULAR | Status: DC | PRN
  Administered 2016-12-29: 4 mg via INTRAVENOUS

## 2016-12-29 SURGICAL SUPPLY — 73 items
ADH SKN CLS APL DERMABOND .7 (GAUZE/BANDAGES/DRESSINGS) ×1
BIT DRILL 14MM (INSTRUMENTS) IMPLANT
BUR MATCHSTICK NEURO 3.0 LAGG (BURR) ×2 IMPLANT
CANISTER SUCT 3000ML PPV (MISCELLANEOUS) ×2 IMPLANT
CARTRIDGE OIL MAESTRO DRILL (MISCELLANEOUS) ×1 IMPLANT
CHLORAPREP W/TINT 26ML (MISCELLANEOUS) ×2 IMPLANT
DECANTER SPIKE VIAL GLASS SM (MISCELLANEOUS) ×2 IMPLANT
DERMABOND ADVANCED (GAUZE/BANDAGES/DRESSINGS) ×1
DERMABOND ADVANCED .7 DNX12 (GAUZE/BANDAGES/DRESSINGS) ×1 IMPLANT
DIFFUSER DRILL AIR PNEUMATIC (MISCELLANEOUS) ×2 IMPLANT
DRAIN SNY WOU 7FLT (WOUND CARE) ×1 IMPLANT
DRAPE C-ARM 42X72 X-RAY (DRAPES) ×4 IMPLANT
DRAPE HALF SHEET 40X57 (DRAPES) IMPLANT
DRAPE LAPAROTOMY 100X72 PEDS (DRAPES) ×2 IMPLANT
DRAPE MICROSCOPE LEICA (MISCELLANEOUS) ×2 IMPLANT
DRAPE POUCH INSTRU U-SHP 10X18 (DRAPES) ×2 IMPLANT
DRAPE SHEET LG 3/4 BI-LAMINATE (DRAPES) ×2 IMPLANT
DRILL 14MM (INSTRUMENTS) ×2
DRSG OPSITE POSTOP 4X6 (GAUZE/BANDAGES/DRESSINGS) ×1 IMPLANT
ELECT COATED BLADE 2.86 ST (ELECTRODE) ×2 IMPLANT
ELECT REM PT RETURN 9FT ADLT (ELECTROSURGICAL) ×2
ELECTRODE REM PT RTRN 9FT ADLT (ELECTROSURGICAL) ×1 IMPLANT
EVACUATOR 1/8 PVC DRAIN (DRAIN) IMPLANT
GAUZE SPONGE 4X4 12PLY STRL (GAUZE/BANDAGES/DRESSINGS) IMPLANT
GAUZE SPONGE 4X4 16PLY XRAY LF (GAUZE/BANDAGES/DRESSINGS) ×1 IMPLANT
GLOVE BIO SURGEON STRL SZ7 (GLOVE) IMPLANT
GLOVE BIOGEL PI IND STRL 6.5 (GLOVE) IMPLANT
GLOVE BIOGEL PI IND STRL 7.0 (GLOVE) IMPLANT
GLOVE BIOGEL PI IND STRL 7.5 (GLOVE) ×1 IMPLANT
GLOVE BIOGEL PI INDICATOR 6.5 (GLOVE) ×1
GLOVE BIOGEL PI INDICATOR 7.0 (GLOVE) ×1
GLOVE BIOGEL PI INDICATOR 7.5 (GLOVE) ×1
GLOVE SS BIOGEL STRL SZ 7.5 (GLOVE) ×2 IMPLANT
GLOVE SUPERSENSE BIOGEL SZ 7.5 (GLOVE) ×2
GLOVE SURG SS PI 6.5 STRL IVOR (GLOVE) ×1 IMPLANT
GOWN STRL REUS W/ TWL LRG LVL3 (GOWN DISPOSABLE) ×1 IMPLANT
GOWN STRL REUS W/ TWL XL LVL3 (GOWN DISPOSABLE) ×1 IMPLANT
GOWN STRL REUS W/TWL LRG LVL3 (GOWN DISPOSABLE) ×4
GOWN STRL REUS W/TWL XL LVL3 (GOWN DISPOSABLE)
HEMOSTAT POWDER KIT SURGIFOAM (HEMOSTASIS) ×2 IMPLANT
KIT BASIN OR (CUSTOM PROCEDURE TRAY) ×2 IMPLANT
KIT ROOM TURNOVER OR (KITS) ×2 IMPLANT
NDL HYPO 21X1.5 SAFETY (NEEDLE) ×1 IMPLANT
NDL SPNL 18GX3.5 QUINCKE PK (NEEDLE) ×1 IMPLANT
NEEDLE HYPO 21X1.5 SAFETY (NEEDLE) ×2 IMPLANT
NEEDLE SPNL 18GX3.5 QUINCKE PK (NEEDLE) ×4 IMPLANT
NS IRRIG 1000ML POUR BTL (IV SOLUTION) ×2 IMPLANT
OIL CARTRIDGE MAESTRO DRILL (MISCELLANEOUS) ×2
PACK LAMINECTOMY NEURO (CUSTOM PROCEDURE TRAY) ×2 IMPLANT
PAD ARMBOARD 7.5X6 YLW CONV (MISCELLANEOUS) ×3 IMPLANT
PATTIES SURGICAL .5X1.5 (GAUZE/BANDAGES/DRESSINGS) ×2 IMPLANT
PIN DISTRACTION 14MM (PIN) ×2 IMPLANT
PLATE 34MM (Plate) ×1 IMPLANT
PUTTY BONE 2.5CC ×1 IMPLANT
RUBBERBAND STERILE (MISCELLANEOUS) ×4 IMPLANT
SCREW 16MM (Screw) ×4 IMPLANT
SCREW FA ST 4.0 16 (Screw) ×2 IMPLANT
SPACER CERVICAL 11X14MM 8MM 5D (Spacer) ×1 IMPLANT
SPACER CERVICAL 11X14MM 9MM 5D (Spacer) ×1 IMPLANT
SPONGE INTESTINAL PEANUT (DISPOSABLE) ×3 IMPLANT
SPONGE SURGIFOAM ABS GEL SZ50 (HEMOSTASIS) IMPLANT
STAPLER VISISTAT 35W (STAPLE) ×2 IMPLANT
STEM CELL AMNIOTIC NUCEL SM (Tissue) ×1 IMPLANT
STOCKINETTE 6  STRL (DRAPES) ×1
STOCKINETTE 6 STRL (DRAPES) ×1 IMPLANT
SUT STRATAFIX MNCRL+ 3-0 PS-2 (SUTURE)
SUT STRATAFIX MONOCRYL 3-0 (SUTURE)
SUT VIC AB 3-0 SH 8-18 (SUTURE) ×2 IMPLANT
SUTURE STRATFX MNCRL+ 3-0 PS-2 (SUTURE) ×1 IMPLANT
SYR 30ML LL (SYRINGE) ×2 IMPLANT
TOWEL GREEN STERILE (TOWEL DISPOSABLE) ×2 IMPLANT
TOWEL GREEN STERILE FF (TOWEL DISPOSABLE) ×3 IMPLANT
WATER STERILE IRR 1000ML POUR (IV SOLUTION) ×2 IMPLANT

## 2016-12-29 NOTE — Anesthesia Postprocedure Evaluation (Signed)
Anesthesia Post Note  Patient: [name]  Procedure(s) Performed: Cervical five-six Cervical six-seven Anterior cervical discectomy with fusion and plate fixation (N/A Spine Cervical)     Patient location during evaluation: PACU Anesthesia Type: General Level of consciousness: awake and alert, patient cooperative and oriented Pain management: pain level controlled Vital Signs Assessment: post-procedure vital signs reviewed and stable Respiratory status: spontaneous breathing, nonlabored ventilation, respiratory function stable and patient connected to nasal cannula oxygen Cardiovascular status: blood pressure returned to baseline and stable Postop Assessment: no apparent nausea or vomiting Anesthetic complications: no    Last Vitals:  Vitals:   12/29/16 1431 12/29/16 1552  BP: (!) 177/99 (!) 165/94  Pulse: 75 65  Resp: 18 18  Temp: 36.8 C 36.7 C  SpO2: 97% 97%    Last Pain:  Vitals:   12/29/16 1452  TempSrc:   PainSc: 7                  Lorene Klimas,E. Sydnei Ohaver

## 2016-12-29 NOTE — H&P (Signed)
CC:  No chief complaint on file. Neck and arm pain  HPI: [name] is a 56 y.o. male with neck and arm pain due to cervical spondylosis with radiculopathy.  He presents for elective two level ACDF.  He denies any changes since I saw him last in clinic.  PMH: Past Medical History:  Diagnosis Date  . Allergic rhinitis, cause unspecified   . Cervicalgia   . Chest pain Sept. 2005   multiple caths  /  cath 06/15/2010..normal coronaries,  EF 60%   (false postive nuclear in the past)  . Dyslipidemia    mixed  . Esophageal reflux   . Fibromyalgia   . Gastroparesis   . Headache(784.0)   . History of kidney stones   . Hyperlipidemia   . Hypertension   . IDDM (insulin dependent diabetes mellitus) ([place])   . Lumbago   . Neuropathy associated with endocrine disorder ([place])   . Overweight(278.02)   . Peripheral vascular disease ([place])   . Pulmonary embolus (H[facility])    Hx of small left lower lobe  pulmonary embolus  . Pulmonary embolus ([place]) 2010ish  . Stroke [facility])    mini stroke aug 2016  . Type II or unspecified type diabetes mellitus with neurological manifestations, not stated as uncontrolled(250.60)   . Type II or unspecified type diabetes mellitus with neurological manifestations, not stated as uncontrolled(250.60)   . Urticaria, unspecified     PSH: Past Surgical History:  Procedure Laterality Date  . ACHILLES TENDON REPAIR Left 2013  . AMPUTATION Left 06/02/2013   Procedure: AMPUTATION 1ST TOE LEFT FOOT;  Surgeon: Marcheta Grammes, DPM;  Location: [facility] ORS;  Service: Orthopedics;  Laterality: Left;  . AMPUTATION Left 07/21/2013   Procedure: PARTIAL AMPUTATION 2ND TOE LEFT FOOT;  Surgeon: Marcheta Grammes, DPM;  Location: [facility] ORS;  Service: Podiatry;  Laterality: Left;  . APPENDECTOMY    . BACK SURGERY  1999   x3  . FOOT ARTHRODESIS Right 01/11/2014   Procedure: ARTHRODESIS INTERPHALANGEAL JOINT HALLUX RIGHT FOOT;  Surgeon: Marcheta Grammes, DPM;  Location: [facility]  ORS;  Service: Podiatry;  Laterality: Right;  . HARDWARE REMOVAL Right 01/17/2015   Procedure: HARDWARE REMOVAL;  Surgeon: Caprice Beaver, DPM;  Location: [facility]S;  Service: Podiatry;  Laterality: Right;  . KNEE ARTHROSCOPY Right 10/2015  . LUMBAR LAMINECTOMY/DECOMPRESSION MICRODISCECTOMY Right 01/12/2013   Procedure: Right Lumbar Three-Four Laminotomy/Foraminotomy;  Surgeon: Floyce Stakes, MD;  Location: [facility];  Service: Neurosurgery;  Laterality: Right;  Right Lumbar Three-Four Laminotomy/Foraminotomy  . NISSEN FUNDOPLICATION    . SHOULDER ARTHROSCOPY W/ ROTATOR CUFF REPAIR Right     SH: Social History   Tobacco Use  . Smoking status: Former Smoker    Packs/day: 3.00    Years: 5.00    Pack years: 15.00    Types: Cigarettes    Start date: 06/20/1977    Last attempt to quit: 07/20/1982    Years since quitting: 34.4  . Smokeless tobacco: Never Used  . Tobacco comment: quit 1980's  Substance Use Topics  . Alcohol use: No    Alcohol/week: 0.0 oz  . Drug use: No    MEDS: Prior to Admission medications   Medication Sig Start Date End Date Taking? Authorizing Provider  aspirin EC 81 MG tablet Take 81 mg by mouth daily.   Yes [provider]  atorvastatin (LIPITOR) 20 MG tablet Take 20 mg by mouth daily.   Yes [provider]  gabapentin (NEURONTIN) 600 MG  tablet TAKE 4 TABLETS BY MOUTH AT BEDTIME. Patient taking differently: TAKE 2400 MG BY MOUTH AT BEDTIME. 02/18/16  Yes Nida, Marella Chimes, MD  LEVEMIR 100 UNIT/ML injection INJECT 60 UNITS INTO THE SKIN AT BEDTIME 03/17/16  Yes Nida, Marella Chimes, MD  metFORMIN (GLUCOPHAGE) 500 MG tablet Take 1 tablet (500 mg total) by mouth 2 (two) times daily with a meal. 2 pills 2 times a day Patient taking differently: Take 1,000 mg 2 (two) times daily with a meal by mouth.  06/20/15  Yes Nida, Marella Chimes, MD  RANEXA 1000 MG SR tablet TAKE 1 TABLET BY MOUTH DAILY (DOSE DECREASE) Patient taking differently:  TAKE 1000 MG BY MOUTH DAILY 05/19/16  Yes Herminio Commons, MD  canagliflozin (INVOKANA) 100 MG TABS tablet Take 1 tablet (100 mg total) by mouth daily. Patient not taking: Reported on 12/17/2016 06/09/16   Cassandria Anger, MD    ALLERGY: Allergies  Allergen Reactions  . Reglan [Metoclopramide] Itching and Rash    ROS: ROS  NEUROLOGIC EXAM: Awake, alert, oriented Memory and concentration grossly intact Speech fluent, appropriate CN grossly intact Motor exam: Upper Extremities Deltoid Bicep Tricep Grip  Right 5/5 5/5 5/5 5/5  Left 5/5 5/5 5/5 5/5   Lower Extremity IP Quad PF DF EHL  Right 5/5 5/5 5/5 5/5 5/5  Left 5/5 5/5 5/5 5/5 5/5   Sensation grossly intact to LT  IMAGING: No new imaging  IMPRESSION: - 56 y.o. male with cervical spondylosis with radiculopathy.  He is neurologically intact.  PLAN: - C5-6, C6-7 anterior discectomy with fusion and plate fixation - We have discussed the risks, benefits, and alternatives to surgery and he wishes to proceed

## 2016-12-29 NOTE — Anesthesia Procedure Notes (Signed)
Procedure Name: Intubation Date/Time: 12/29/2016 10:01 AM Performed by: Inda Coke, CRNA Pre-anesthesia Checklist: Patient identified, Emergency Drugs available, Suction available and Patient being monitored Patient Re-evaluated:Patient Re-evaluated prior to induction Oxygen Delivery Method: Circle System Utilized Preoxygenation: Pre-oxygenation with 100% oxygen Induction Type: IV induction Ventilation: Mask ventilation without difficulty Laryngoscope Size: Mac and 4 Grade View: Grade I Tube type: Oral Tube size: 7.5 mm Number of attempts: 1 Airway Equipment and Method: Stylet and Oral airway Placement Confirmation: ETT inserted through vocal cords under direct vision,  positive ETCO2 and breath sounds checked- equal and bilateral Secured at: 22 cm Tube secured with: Tape Dental Injury: Teeth and Oropharynx as per pre-operative assessment

## 2016-12-29 NOTE — Transfer of Care (Signed)
Immediate Anesthesia Transfer of Care Note  Patient: [name]  Procedure(s) Performed: Cervical five-six Cervical six-seven Anterior cervical discectomy with fusion and plate fixation (N/A Spine Cervical)  Patient Location: PACU  Anesthesia Type:General  Level of Consciousness: awake and alert   Airway & Oxygen Therapy: Patient Spontanous Breathing and Patient connected to nasal cannula oxygen  Post-op Assessment: Report given to RN, Post -op Vital signs reviewed and stable, Patient moving all extremities X 4 and Patient able to stick tongue midline  Post vital signs: Reviewed and stable  Last Vitals:  Vitals:   12/29/16 0848 12/29/16 0852  BP:  (!) 146/94  Pulse: 62   Resp: 18   Temp: (!) 36.3 C   SpO2: 98%     Last Pain:  Vitals:   12/29/16 0859  TempSrc:   PainSc: 5          Complications: No apparent anesthesia complications

## 2016-12-29 NOTE — Anesthesia Preprocedure Evaluation (Addendum)
Anesthesia Evaluation  Patient identified by MRN, date of birth, ID band Patient awake    Reviewed: Allergy & Precautions, NPO status , Patient's Chart, lab work & pertinent test results  History of Anesthesia Complications Negative for: history of anesthetic complications  Airway Mallampati: II  TM Distance: >3 FB Neck ROM: Full    Dental  (+) Poor Dentition, Dental Advisory Given, Missing   Pulmonary former smoker (quit 1984), PE   breath sounds clear to auscultation       Cardiovascular hypertension, (-) angina Rhythm:Regular Rate:Normal  5/12 cath: normal coronaries,  EF 60%    Neuro/Psych TIA   GI/Hepatic Neg liver ROS, GERD  Controlled,  Endo/Other  diabetes (glu 150), Oral Hypoglycemic Agents, Insulin Dependent  Renal/GU Renal InsufficiencyRenal disease (creat 1.71)     Musculoskeletal  (+) Fibromyalgia -  Abdominal (+) + obese,   Peds  Hematology negative hematology ROS (+)   Anesthesia Other Findings   Reproductive/Obstetrics                            Anesthesia Physical Anesthesia Plan  ASA: III  Anesthesia Plan: General   Post-op Pain Management:    Induction: Intravenous  PONV Risk Score and Plan: 3 and Ondansetron, Dexamethasone and Midazolam  Airway Management Planned: Oral ETT  Additional Equipment:   Intra-op Plan:   Post-operative Plan: Extubation in OR  Informed Consent: I have reviewed the patients History and Physical, chart, labs and discussed the procedure including the risks, benefits and alternatives for the proposed anesthesia with the patient or authorized representative who has indicated his/her understanding and acceptance.   Dental advisory given  Plan Discussed with: CRNA and Surgeon  Anesthesia Plan Comments: (Plan routine monitors, GETA)        Anesthesia Quick Evaluation

## 2016-12-29 NOTE — Evaluation (Signed)
Physical Therapy Evaluation Patient Details Name: [name] MRN: [id number] DOB: 09/21/1960 Today's Date: 12/29/2016   History of Present Illness  Pt underwent Cervical five-six Cervical six-seven Anterior cervical discectomy with fusion and plate fixation.  Has had multiple back and knee surgeries in past.  HTN, DM, PE and CVA in PMH.    Clinical Impression  Pt admitted with above diagnosis. Pt currently without significant functional limitations. Pt was able to ambulate without device with independence.  Slow and guarded but no LOB noted with min challenges.  Does not need PT.  All education completed.  Will sign off.    Follow Up Recommendations No PT follow up    Equipment Recommendations  None recommended by PT    Recommendations for Other Services       Precautions / Restrictions Precautions Precautions: Fall;Cervical Precaution Comments: No cervical brace; discussed Cervical precautions Restrictions Weight Bearing Restrictions: No      Mobility  Bed Mobility Overal bed mobility: Independent                Transfers Overall transfer level: Independent                  Ambulation/Gait Ambulation/Gait assistance: Independent Ambulation Distance (Feet): 350 Feet Assistive device: None Gait Pattern/deviations: Step-through pattern;Decreased stride length   Gait velocity interpretation: Below normal speed for age/gender General Gait Details: Slow cautious gait but no LOB.  OVerall did well.    Stairs            Wheelchair Mobility    Modified Rankin (Stroke Patients Only)       Balance Overall balance assessment: Needs assistance         Standing balance support: No upper extremity supported;During functional activity Standing balance-Leahy Scale: Good Standing balance comment: can withstand challenges to balance and uses stepping strategy.                             Pertinent Vitals/Pain Pain Assessment:  0-10 Pain Score: 3  Pain Location: neck Pain Descriptors / Indicators: Discomfort;Guarding Pain Intervention(s): Limited activity within patient's tolerance;Monitored during session;Repositioned    Home Living Family/patient expects to be discharged to:: Private residence Living Arrangements: Spouse/significant other Available Help at Discharge: Family;Available PRN/intermittently(wife works; pt mows yards in summers) Type of Home: House Home Access: Stairs to enter Entrance Stairs-Rails: Building surveyor of Steps: 2 Home Layout: One level Home Equipment: (walking stick)      Prior Function Level of Independence: Independent with assistive device(s);Independent               Hand Dominance   Dominant Hand: Right    Extremity/Trunk Assessment   Upper Extremity Assessment Upper Extremity Assessment: Defer to OT evaluation    Lower Extremity Assessment Lower Extremity Assessment: Overall WFL for tasks assessed    Cervical / Trunk Assessment Cervical / Trunk Assessment: Normal  Communication   Communication: No difficulties  Cognition Arousal/Alertness: Awake/alert Behavior During Therapy: WFL for tasks assessed/performed Overall Cognitive Status: Within Functional Limits for tasks assessed                                        General Comments      Exercises     Assessment/Plan    PT Assessment Patent does not need any further PT services  PT Problem  List         PT Treatment Interventions      PT Goals (Current goals can be found in the Care Plan section)  Acute Rehab PT Goals Patient Stated Goal: to go home PT Goal Formulation: All assessment and education complete, DC therapy    Frequency     Barriers to discharge        Co-evaluation               AM-PAC PT "6 Clicks" Daily Activity  Outcome Measure Difficulty turning over in bed (including adjusting bedclothes, sheets and blankets)?: None Difficulty  moving from lying on back to sitting on the side of the bed? : None Difficulty sitting down on and standing up from a chair with arms (e.g., wheelchair, bedside commode, etc,.)?: None Help needed moving to and from a bed to chair (including a wheelchair)?: None Help needed walking in hospital room?: None Help needed climbing 3-5 steps with a railing? : None 6 Click Score: 24    End of Session Equipment Utilized During Treatment: Gait belt Activity Tolerance: Patient limited by pain;Patient tolerated treatment well Patient left: in bed;with call bell/phone within reach Nurse Communication: Mobility status PT Visit Diagnosis: Muscle weakness (generalized) (M62.81)    Time: 6948-5462 PT Time Calculation (min) (ACUTE ONLY): 15 min   Charges:   PT Evaluation $PT Eval Low Complexity: 1 Low     PT G Codes:   PT G-Codes **NOT FOR INPATIENT CLASS** Functional Assessment Tool Used: AM-PAC 6 Clicks Basic Mobility Functional Limitation: Mobility: Walking and moving around Mobility: Walking and Moving Around Current Status (V0350): 0 percent impaired, limited or restricted Mobility: Walking and Moving Around Goal Status (K9381): 0 percent impaired, limited or restricted Mobility: Walking and Moving Around Discharge Status (W2993): 0 percent impaired, limited or restricted    [facility] Acute Rehabilitation [phone] (pager)   Denice Paradise 12/29/2016, 4:53 PM

## 2016-12-29 NOTE — Plan of Care (Signed)
  Progressing Safety: Ability to remain free from injury will improve 12/29/2016 1940 - Progressing by Charlena Cross, RN Activity: Ability to avoid complications of mobility impairment will improve 12/29/2016 1940 - Progressing by Charlena Cross, RN Ability to tolerate increased activity will improve 12/29/2016 1940 - Progressing by Charlena Cross, RN Will remain free from falls 12/29/2016 1940 - Progressing by Margot Chimes D, RN Bowel/Gastric: Gastrointestinal status for postoperative course will improve 12/29/2016 1940 - Progressing by Charlena Cross, RN Education: Ability to verbalize activity precautions or restrictions will improve 12/29/2016 1940 - Progressing by Margot Chimes D, RN Knowledge of the prescribed therapeutic regimen will improve 12/29/2016 1940 - Progressing by Charlena Cross, RN Understanding of discharge needs will improve 12/29/2016 1940 - Progressing by Charlena Cross, RN Physical Regulation: Ability to maintain clinical measurements within normal limits will improve 12/29/2016 1940 - Progressing by Charlena Cross, RN Postoperative complications will be avoided or minimized 12/29/2016 1940 - Progressing by Charlena Cross, RN Diagnostic test results will improve 12/29/2016 1940 - Progressing by Charlena Cross, RN Pain Management: Pain level will decrease 12/29/2016 1940 - Progressing by Charlena Cross, RN Skin Integrity: Signs of wound healing will improve 12/29/2016 1940 - Progressing by Margot Chimes D, RN Health Behavior/Discharge Planning: Identification of resources available to assist in meeting health care needs will improve 12/29/2016 1940 - Progressing by Charlena Cross, RN Bladder/Genitourinary: Urinary functional status for postoperative course will improve 12/29/2016 1940 - Progressing by Charlena Cross, RN

## 2016-12-29 NOTE — Op Note (Signed)
12/29/2016  12:23 PM  PATIENT:  [name]  56 y.o. male  PRE-OPERATIVE DIAGNOSIS:  Cervical spondylosis with radiculopathy  POST-OPERATIVE DIAGNOSIS:  Same  PROCEDURE:  C5-6, C6-7 anterior discectomy with fusion and plate fixation  SURGEON:  Aldean Ast, MD  ASSISTANTS: Earnie Larsson, MD  ANESTHESIA:   General  DRAINS: JP   SPECIMEN:  None  INDICATION FOR PROCEDURE: 56 year old man with neck and arm pain due to cervical spondylosis with radiculopathy.  He hasn't improved despite prolonged conservative treatment.  I recommended the above procedure. The patient understood the risks, benefits, and alternatives and potential outcomes and wished to proceed.  PROCEDURE DETAILS: Patient was brought to the operating room placed under general endotracheal anesthesia. Patient was placed in the supine position on the operating room table. The neck was prepped with betadine and chloraprep and draped in a sterile fashion.   A transverse incision was made on the right side of the neck. Dissection was carried down thru the subcutaneous tissue and the platysma was elevated, opened vertically, and undermined with Metzenbaum scissors. Dissection was then carried out thru an avascular plane leaving the sternocleidomastoid, carotid artery, and jugular vein laterally and the trachea and esophagus medially. The ventral aspect of the vertebral column was identified and a localizing x-ray was taken. The C5-6 level was identified. The longus colli muscles were then elevated and the retractor was placed. The annulus was incised and the disc space entered. Discectomy was performed with micro-curettes and pituitary and kerrison rongeurs. I then used the high-speed drill to drill the endplates down to the level of the posterior longitudinal ligament. The operating microscope was draped and brought into the field provided additional magnification, illumination and visualization. Utilizing microsurgical  technique, discectomy was continued posteriorly thru the disc space. Posterior longitudinal ligament was opened with a nerve hook, and then removed along with disc herniation and osteophytes, decompressing the spinal canal and thecal sac. We then continued to remove osteophytic overgrowth and disc material decompressing the neural foramina and exiting nerve roots bilaterally. So by both visualization and palpation we felt we had an adequate decompression of the neural elements. We then measured the height of the intravertebral disc space and selected a machined lordotic allograft interbody cage packed with morselized allograft. It was then gently positioned in the intravertebral disc space and countersunk.   The superior distraction pin was then removed and bone bleeding was stopped with bone wax. The distraction pin was inserted at the inferior level. The annulus was incised and the disc space entered. Discectomy was performed with micro-curettes and pituitary and kerrison rongeurs. I then used the high-speed drill to drill the endplates down to the level of the posterior longitudinal ligament. The operating microscope was returned to the field.  The discectomy was continued posteriorly thru the disc space. Posterior longitudinal ligament was opened with a nerve hook, and then removed along with disc herniation and osteophytes, decompressing the spinal canal and thecal sac. We then continued to remove osteophytic overgrowth and disc material decompressing the neural foramina and exiting nerve roots bilaterally. So by both visualization and palpation we felt we had an adequate decompression of the neural elements. We then measured the height of the intravertebral disc space and selected a second interbody cage which was packed with morselized allograft. It was then gently positioned in the intravertebral disc space and countersunk.   I then inserted a titanium plate and placed four variable angle screws into  the two superior  vertebral bodies and two fixed angle screws at the inferior level and locked them into position. The wound was irrigated with bacitracin solution, checked for hemostasis which was established and confirmed. Once meticulous hemostasis was achieved a JP drain was placed. The platysma was closed with interrupted 3-0 undyed Vicryl suture, the subcuticular layer was closed with running undyed Vicryl suture. The skin edges were approximated with dermabond. The drapes were removed. A sterile dressing was applied. The patient was then awakened from general anesthesia and transferred to the recovery room in stable condition. At the end of the procedure all sponge, needle and instrument counts were correct.   PATIENT DISPOSITION:  PACU - hemodynamically stable.   Delay start of Pharmacological VTE agent (>24hrs) due to surgical blood loss or risk of bleeding:  yes

## 2016-12-30 ENCOUNTER — Encounter (HOSPITAL_COMMUNITY): Payer: Self-pay | Admitting: Neurological Surgery

## 2016-12-30 ENCOUNTER — Other Ambulatory Visit: Payer: Self-pay | Admitting: Cardiovascular Disease

## 2016-12-30 DIAGNOSIS — M4722 Other spondylosis with radiculopathy, cervical region: Secondary | ICD-10-CM | POA: Diagnosis not present

## 2016-12-30 LAB — GLUCOSE, CAPILLARY: GLUCOSE-CAPILLARY: 254 mg/dL — AB (ref 65–99)

## 2016-12-30 MED ORDER — METHOCARBAMOL 750 MG PO TABS: 750.0000 mg | ORAL_TABLET | Freq: Four times a day (QID) | ORAL | 2 refills | Status: DC

## 2016-12-30 MED ORDER — PANTOPRAZOLE SODIUM 40 MG PO TBEC: 40.0000 mg | DELAYED_RELEASE_TABLET | Freq: Every day | ORAL | Status: DC

## 2016-12-30 NOTE — Discharge Summary (Signed)
Date of Admission: 12/29/2016  Date of Discharge: 12/30/16  Admission Diagnosis: Cervical spondylosis with radiculopathy  Discharge Diagnosis: Same  Procedure Performed: C5-7 ACDF  Attending: Brandell Maready, Kevan Ny, MD  Hospital Course:  The patient was admitted for the above listed operation and had an uncomplicated post-operative course.  He was discharged in stable condition.  Follow up: 3 weeks  Allergies as of 12/30/2016      Reactions   Reglan [metoclopramide] Itching, Rash      Medication List    TAKE these medications   aspirin EC 81 MG tablet Take 81 mg by mouth daily.   atorvastatin 20 MG tablet Commonly known as:  LIPITOR Take 20 mg by mouth daily.   canagliflozin 100 MG Tabs tablet Commonly known as:  INVOKANA Take 1 tablet (100 mg total) by mouth daily.   gabapentin 600 MG tablet Commonly known as:  NEURONTIN TAKE 4 TABLETS BY MOUTH AT BEDTIME. What changed:    how much to take  how to take this  when to take this   LEVEMIR 100 UNIT/ML injection Generic drug:  insulin detemir INJECT 60 UNITS INTO THE SKIN AT BEDTIME   metFORMIN 500 MG tablet Commonly known as:  GLUCOPHAGE Take 1 tablet (500 mg total) by mouth 2 (two) times daily with a meal. 2 pills 2 times a day What changed:    how much to take  additional instructions   methocarbamol 750 MG tablet Commonly known as:  ROBAXIN Take 1 tablet (750 mg total) by mouth 4 (four) times daily.   RANEXA 1000 MG SR tablet Generic drug:  ranolazine TAKE 1 TABLET BY MOUTH DAILY (DOSE DECREASE) What changed:  See the new instructions.

## 2016-12-30 NOTE — Progress Notes (Signed)
Pt doing well. Pt and wife given D/C instructions with verbal understanding. Pt's incision is clean and dry with no sign of infection. Pt's IV and JP drain were removed prior to D/C. Pt D/C'd home via wheelchair @ 1010 per MD order. Pt is stable @ D/C and has no other needs at this time. Holli Humbles, RN

## 2016-12-30 NOTE — Evaluation (Signed)
Occupational Therapy Evaluation Patient Details Name: [name] MRN: [id number] DOB: 01/15/1961 Today's Date: 12/30/2016    History of Present Illness Pt underwent Cervical five-six Cervical six-seven Anterior cervical discectomy with fusion and plate fixation.  Has had multiple back and knee surgeries in past.  HTN, DM, PE and CVA in PMH.     Clinical Impression   Patient evaluated by Occupational Therapy with no further acute OT needs identified. All education has been completed and the patient has no further questions. See below for any follow-up Occupational Therapy or equipment needs. OT to sign off. Thank you for referral.      Follow Up Recommendations  No OT follow up    Equipment Recommendations  None recommended by OT    Recommendations for Other Services       Precautions / Restrictions Precautions Precautions: Fall;Cervical Precaution Comments: No cervical brace; discussed Cervical precautions Restrictions Weight Bearing Restrictions: No      Mobility Bed Mobility Overal bed mobility: Independent                Transfers Overall transfer level: Independent                    Balance Overall balance assessment: Modified Independent                                         ADL either performed or assessed with clinical judgement   ADL Overall ADL's : Independent                                       General ADL Comments: able to don LB adls, buttoned jeans, washed hands, reports normal sensation with water, able to open coffee lid, about to open top off peaches, able to select only the fork from tray. pt does not require OT services and educated on funcational activities to help with hand strengthening  Cervical precaution:educated on oral care using cups, washing face with cloth, never to wash directly on incision site, avoid neck rotation flexion and extension, positioning with pillows in chair for  bil UE, sleeping positioning, avoiding pushing / pulling with bil UE, and fine motor exercises..  Pt educated on need to notify doctor / RN of swallowing changes or choking..     Vision Baseline Vision/History: No visual deficits       Perception     Praxis      Pertinent Vitals/Pain Pain Assessment: 0-10 Pain Score: 1  Pain Location: neck Pain Descriptors / Indicators: Discomfort;Guarding Pain Intervention(s): Repositioned     Hand Dominance Right   Extremity/Trunk Assessment Upper Extremity Assessment Upper Extremity Assessment: Generalized weakness(reports normal sensation / hand function)       Cervical / Trunk Assessment Cervical / Trunk Assessment: Other exceptions(s/p surg ACDF)   Communication Communication Communication: No difficulties   Cognition Arousal/Alertness: Awake/alert Behavior During Therapy: WFL for tasks assessed/performed Overall Cognitive Status: Within Functional Limits for tasks assessed                                     General Comments  dressing intact     Exercises     Shoulder Instructions      Home Living  Family/patient expects to be discharged to:: Private residence Living Arrangements: Spouse/significant other Available Help at Discharge: Family;Available PRN/intermittently Type of Home: House Home Access: Stairs to enter [employer] of Steps: 2 Entrance Stairs-Rails: Right Home Layout: One level     Bathroom Shower/Tub: Teacher, early years/pre: Standard                Prior Functioning/Environment Level of Independence: Independent with assistive device(s);Independent                 OT Problem List:        OT Treatment/Interventions:      OT Goals(Current goals can be found in the care plan section) Acute Rehab OT Goals Patient Stated Goal: to go home  OT Frequency:     Barriers to D/C:            Co-evaluation              AM-PAC PT "6 Clicks"  Daily Activity     Outcome Measure Help from another person eating meals?: None Help from another person taking care of personal grooming?: None Help from another person toileting, which includes using toliet, bedpan, or urinal?: None Help from another person bathing (including washing, rinsing, drying)?: None Help from another person to put on and taking off regular upper body clothing?: None Help from another person to put on and taking off regular lower body clothing?: None 6 Click Score: 24   End of Session Nurse Communication: Mobility status;Precautions  Activity Tolerance: Patient tolerated treatment well Patient left: in chair;with call bell/phone within reach  OT Visit Diagnosis: Muscle weakness (generalized) (M62.81)                Time: 4888-9169 OT Time Calculation (min): 11 min Charges:  OT General Charges $OT Visit: 1 Visit OT Evaluation $OT Eval Low Complexity: 1 Low G-Codes: OT G-codes **NOT FOR INPATIENT CLASS** Functional Assessment Tool Used: AM-PAC 6 Clicks Daily Activity Functional Limitation: Self care Self Care Current Status (I5038): 0 percent impaired, limited or restricted Self Care Goal Status (U8280): 0 percent impaired, limited or restricted Self Care Discharge Status (K3491): 0 percent impaired, limited or restricted    Jeri Modena   OTR/L Pager: [phone] Office: [phone] .   Parke Poisson B 12/30/2016, 8:45 AM

## 2016-12-30 NOTE — Progress Notes (Signed)
No acute events Doing well Moving arms and legs well Incision c/d/i D/c

## 2017-01-02 ENCOUNTER — Emergency Department (HOSPITAL_COMMUNITY)
Admission: EM | Admit: 2017-01-02 | Discharge: 2017-01-02 | Disposition: A | Discharge status: Home / Self Care | Payer: Medicare HMO | Attending: Emergency Medicine | Admitting: Emergency Medicine

## 2017-01-02 ENCOUNTER — Other Ambulatory Visit: Payer: Self-pay

## 2017-01-02 ENCOUNTER — Encounter (HOSPITAL_COMMUNITY): Payer: Self-pay | Admitting: Emergency Medicine

## 2017-01-02 ENCOUNTER — Emergency Department (HOSPITAL_COMMUNITY): Payer: Medicare HMO

## 2017-01-02 DIAGNOSIS — Z8673 Personal history of transient ischemic attack (TIA), and cerebral infarction without residual deficits: Secondary | ICD-10-CM | POA: Diagnosis not present

## 2017-01-02 DIAGNOSIS — G8918 Other acute postprocedural pain: Secondary | ICD-10-CM | POA: Insufficient documentation

## 2017-01-02 DIAGNOSIS — Z87891 Personal history of nicotine dependence: Secondary | ICD-10-CM | POA: Diagnosis not present

## 2017-01-02 DIAGNOSIS — Z794 Long term (current) use of insulin: Secondary | ICD-10-CM | POA: Insufficient documentation

## 2017-01-02 DIAGNOSIS — R0600 Dyspnea, unspecified: Secondary | ICD-10-CM | POA: Insufficient documentation

## 2017-01-02 DIAGNOSIS — E119 Type 2 diabetes mellitus without complications: Secondary | ICD-10-CM | POA: Insufficient documentation

## 2017-01-02 DIAGNOSIS — I10 Essential (primary) hypertension: Secondary | ICD-10-CM | POA: Insufficient documentation

## 2017-01-02 DIAGNOSIS — R221 Localized swelling, mass and lump, neck: Secondary | ICD-10-CM | POA: Diagnosis not present

## 2017-01-02 DIAGNOSIS — Z79899 Other long term (current) drug therapy: Secondary | ICD-10-CM | POA: Insufficient documentation

## 2017-01-02 DIAGNOSIS — M542 Cervicalgia: Secondary | ICD-10-CM | POA: Diagnosis not present

## 2017-01-02 LAB — CBC WITH DIFFERENTIAL/PLATELET
BASOS ABS: 0.1 10*3/uL (ref 0.0–0.1)
BASOS PCT: 1 %
EOS ABS: 0.3 10*3/uL (ref 0.0–0.7)
EOS PCT: 4 %
HEMATOCRIT: 39.6 % (ref 39.0–52.0)
Hemoglobin: 13.5 g/dL (ref 13.0–17.0)
Lymphocytes Relative: 20 %
Lymphs Abs: 1.7 10*3/uL (ref 0.7–4.0)
MCH: 28.8 pg (ref 26.0–34.0)
MCHC: 34.1 g/dL (ref 30.0–36.0)
MCV: 84.4 fL (ref 78.0–100.0)
MONO ABS: 0.9 10*3/uL (ref 0.1–1.0)
MONOS PCT: 10 %
Neutro Abs: 5.6 10*3/uL (ref 1.7–7.7)
Neutrophils Relative %: 65 %
PLATELETS: 168 10*3/uL (ref 150–400)
RBC: 4.69 MIL/uL (ref 4.22–5.81)
RDW: 13.6 % (ref 11.5–15.5)
WBC: 8.6 10*3/uL (ref 4.0–10.5)

## 2017-01-02 LAB — BASIC METABOLIC PANEL
ANION GAP: 9 (ref 5–15)
BUN: 19 mg/dL (ref 6–20)
CALCIUM: 9.2 mg/dL (ref 8.9–10.3)
CO2: 23 mmol/L (ref 22–32)
Chloride: 102 mmol/L (ref 101–111)
Creatinine, Ser: 1.42 mg/dL — ABNORMAL HIGH (ref 0.61–1.24)
GFR calc Af Amer: >60 mL/min (ref >60)
GFR calc non Af Amer: 53 mL/min — ABNORMAL LOW (ref >60)
GLUCOSE: 233 mg/dL — AB (ref 65–99)
Potassium: 3.9 mmol/L (ref 3.5–5.1)
Sodium: 134 mmol/L — ABNORMAL LOW (ref 135–145)

## 2017-01-02 MED ORDER — MORPHINE SULFATE (PF) 4 MG/ML IV SOLN
4.0000 mg | Freq: Once | INTRAVENOUS | Status: AC
  Administered 2017-01-02: 4 mg via INTRAVENOUS
  Filled 2017-01-02: qty 1

## 2017-01-02 MED ORDER — IOPAMIDOL (ISOVUE-370) INJECTION 76%
INTRAVENOUS | Status: AC
  Administered 2017-01-02: 100 mL via INTRAVENOUS
  Filled 2017-01-02: qty 100

## 2017-01-02 MED ORDER — SODIUM CHLORIDE 0.9 % IV BOLUS (SEPSIS)
500.0000 mL | Freq: Once | INTRAVENOUS | Status: AC
  Administered 2017-01-02: 500 mL via INTRAVENOUS

## 2017-01-02 NOTE — ED Notes (Signed)
ED Provider at bedside. 

## 2017-01-02 NOTE — ED Provider Notes (Signed)
[place] EMERGENCY DEPARTMENT Provider Note   CSN: [id number] Arrival date & time: 01/02/17  0507     History   Chief Complaint Chief Complaint  Patient presents with  . Post-op Problem    Neck Swelling    HPI [name] is a 56 y.o. male.  HPI   56 year old male presenting for evaluation of neck pain.  Patient with history of cervical spondylosis with radiculopathy who had an elective anterior discectomy with fusion and plate fixation of the C5-C6/C6-C7 by Dr. Cyndy Freeze.  This morning he woke up and noticed pain and swelling to the right side of his neck.  States he was having some trouble catching his breath with increased shortness of breath.  Endorse some pain in his chest pleuritic nature.  Also endorse a throbbing headache on the posterior aspect of his head similar to prior headaches.  Patient mentioned he was doing fine after discharge several days prior.  He does report some discomfort with swallowing.  He denies having fever, chills, lightheadedness, dizziness, arm pain, leg pain or leg swelling.  Remote history of PE however currently not on any blood thinning medication.  Past Medical History:  Diagnosis Date  . Allergic rhinitis, cause unspecified   . Cervicalgia   . Chest pain Sept. 2005   multiple caths  /  cath 06/15/2010..normal coronaries,  EF 60%   (false postive nuclear in the past)  . Dyslipidemia    mixed  . Esophageal reflux   . Fibromyalgia   . Gastroparesis   . Headache(784.0)   . History of kidney stones   . Hyperlipidemia   . Hypertension   . IDDM (insulin dependent diabetes mellitus) ([place])   . Lumbago   . Neuropathy associated with endocrine disorder ([place])   . Overweight(278.02)   . Peripheral vascular disease ([place])   . Pulmonary embolus (HCC)    Hx of small left lower lobe  pulmonary embolus  . Pulmonary embolus ([place]) 2010ish  . Stroke [facility])    mini stroke aug 2016  . Type II or unspecified type diabetes mellitus  with neurological manifestations, not stated as uncontrolled(250.60)   . Type II or unspecified type diabetes mellitus with neurological manifestations, not stated as uncontrolled(250.60)   . Urticaria, unspecified     Patient Active Problem List   Diagnosis Date Noted  . Cervical spondylosis with radiculopathy 12/29/2016  . TIA (transient ischemic attack) 08/30/2014  . 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 Neuropathy 08/30/2014  . AKI (acute kidney injury) ([place]) 08/30/2014  . Bilateral occipital neuralgia 10/30/2013  . Lumbar stenosis without neurogenic claudication 01/12/2013  . Type 2 diabetes mellitus with vascular disease ([place])   . Hypertension   . Hyperlipidemia   . Gastroparesis   . Pulmonary embolus ([place])   . Obesity   . GERD 01/14/2010  . Chest pain 10/05/2003    Past Surgical History:  Procedure Laterality Date  . ACHILLES TENDON REPAIR Left 2013  . AMPUTATION Left 06/02/2013   Procedure: AMPUTATION 1ST TOE LEFT FOOT;  Surgeon: Marcheta Grammes, DPM;  Location: [facility];  Service: Orthopedics;  Laterality: Left;  . AMPUTATION Left 07/21/2013   Procedure: PARTIAL AMPUTATION 2ND TOE LEFT FOOT;  Surgeon: Marcheta Grammes, DPM;  Location: [facility];  Service: Podiatry;  Laterality: Left;  . ANTERIOR CERVICAL DECOMP/DISCECTOMY FUSION N/A 12/29/2016   Procedure: Cervical five-six Cervical six-seven Anterior cervical discectomy with fusion and plate fixation;  Surgeon: Ditty, Kevan Ny, MD;  Location:  [place] OR;  Service: Neurosurgery;  Laterality: N/A;  . APPENDECTOMY    . BACK SURGERY  1999   x3  . FOOT ARTHRODESIS Right 01/11/2014   Procedure: ARTHRODESIS INTERPHALANGEAL JOINT HALLUX RIGHT FOOT;  Surgeon: Marcheta Grammes, DPM;  Location: [facility];  Service: Podiatry;  Laterality: Right;  . HARDWARE REMOVAL Right 01/17/2015   Procedure: HARDWARE REMOVAL;  Surgeon: Caprice Beaver, DPM;  Location: [facility];  Service: Podiatry;  Laterality: Right;  .  KNEE ARTHROSCOPY Right 10/2015  . LUMBAR LAMINECTOMY/DECOMPRESSION MICRODISCECTOMY Right 01/12/2013   Procedure: Right Lumbar Three-Four Laminotomy/Foraminotomy;  Surgeon: Floyce Stakes, MD;  Location: [facility];  Service: Neurosurgery;  Laterality: Right;  Right Lumbar Three-Four Laminotomy/Foraminotomy  . NISSEN FUNDOPLICATION    . SHOULDER ARTHROSCOPY W/ ROTATOR CUFF REPAIR Right        Home Medications    Prior to Admission medications   Medication Sig Start Date End Date Taking? Authorizing Provider  aspirin EC 81 MG tablet Take 81 mg by mouth daily.   Yes [provider]  atorvastatin (LIPITOR) 20 MG tablet Take 20 mg by mouth daily.   Yes [provider]  gabapentin (NEURONTIN) 600 MG tablet TAKE 4 TABLETS BY MOUTH AT BEDTIME. Patient taking differently: TAKE 2400 MG BY MOUTH AT BEDTIME. 02/18/16  Yes Nida, Marella Chimes, MD  LEVEMIR 100 UNIT/ML injection INJECT 60 UNITS INTO THE SKIN AT BEDTIME 03/17/16  Yes Nida, Marella Chimes, MD  metFORMIN (GLUCOPHAGE) 500 MG tablet Take 1 tablet (500 mg total) by mouth 2 (two) times daily with a meal. 2 pills 2 times a day Patient taking differently: Take 1,000 mg 2 (two) times daily with a meal by mouth.  06/20/15  Yes Nida, Marella Chimes, MD  methocarbamol (ROBAXIN) 750 MG tablet Take 1 tablet (750 mg total) by mouth 4 (four) times daily. 12/30/16  Yes Ditty, Kevan Ny, MD  RANEXA 1000 MG SR tablet TAKE 1 TABLET BY MOUTH DAILY 12/30/16  Yes Herminio Commons, MD  canagliflozin (INVOKANA) 100 MG TABS tablet Take 1 tablet (100 mg total) by mouth daily. Patient not taking: Reported on 12/17/2016 06/09/16   Cassandria Anger, MD    Family History Family History  Problem Relation Age of Onset  . Heart attack Father 20  . Hyperlipidemia Father   . Hypertension Father   . Heart attack Mother 37  . Diabetes Mother   . Hypertension Mother   . Hyperlipidemia Mother   . Healthy Brother   . Seizures Brother    . Migraines Neg Hx     Social History Social History   Tobacco Use  . Smoking status: Former Smoker    Packs/day: 3.00    Years: 5.00    Pack years: 15.00    Types: Cigarettes    Start date: 06/20/1977    Last attempt to quit: 07/20/1982    Years since quitting: 34.4  . Smokeless tobacco: Never Used  . Tobacco comment: quit 1980's  Substance Use Topics  . Alcohol use: No    Alcohol/week: 0.0 oz  . Drug use: No     Allergies   Reglan [metoclopramide]   Review of Systems Review of Systems  All other systems reviewed and are negative.    Physical Exam Updated Vital Signs BP 135/90 (BP Location: Right Arm)   Pulse 77   Temp 97.7 F (36.5 C) (Oral)   Resp 18   SpO2 95%   Physical Exam  Constitutional: He appears well-developed and  well-nourished. No distress.  HENT:  Head: Atraumatic.  Mouth/Throat: Oropharynx is clear and moist.  Eyes: Conjunctivae are normal.  Neck: Neck supple.  Recent surgical site at the right anterior neck with surrounding hematoma and tenderness to palpation.  Sutures in place.  No signs of infection.  Cardiovascular: Normal rate and regular rhythm.  Pulmonary/Chest: Effort normal and breath sounds normal. No respiratory distress. He has no wheezes. He has no rales.  Abdominal: Soft. He exhibits no distension. There is no tenderness.  Musculoskeletal: He exhibits no edema.  Neurological: He is alert.  Skin: No rash noted.  Psychiatric: He has a normal mood and affect.  Nursing note and vitals reviewed.    ED Treatments / Results  Labs (all labs ordered are listed, but only abnormal results are displayed) Labs Reviewed  BASIC METABOLIC PANEL - Abnormal; Notable for the following components:      Result Value   Sodium 134 (*)    Glucose, Bld 233 (*)    Creatinine, Ser 1.42 (*)    GFR calc non Af Amer 53 (*)    All other components within normal limits  CBC WITH DIFFERENTIAL/PLATELET    EKG  EKG  Interpretation  Date/Time:  Friday January 02 2017 06:48:43 EST Ventricular Rate:  63 PR Interval:    QRS Duration: 82 QT Interval:  381 QTC Calculation: 390 R Axis:   52 Text Interpretation:  Sinus rhythm No significant change was found Confirmed by Ezequiel Essex [phone]) on 01/02/2017 6:53:46 AM       Radiology Ct Soft Tissue Neck W Contrast  Result Date: 01/02/2017 CLINICAL DATA:  Anterior cervical fusion 4 days ago with swelling of the right neck and difficulty breathing. EXAM: CT NECK WITH CONTRAST TECHNIQUE: Multidetector CT imaging of the neck was performed using the standard protocol following the bolus administration of intravenous contrast. CONTRAST:  100 cc Isovue 370 COMPARISON:  Operative images 12/29/2016. MRI 11/14/2016. CT 04/06/2016. FINDINGS: There is no unexpected finding given the performance of ACDF from a right-sided approach 4 days ago. There is edema along the operative approach but no evidence of unexpected focal hematoma or definable abscess. A few small bulb walls of air consistent with recent surgery. ACDF C5-C7 has a good appearance with good positioning of the interbody fusion material, anterior plate and screws. No sign of retained surgical object. No pre-existing neck pathology is seen. No lymphadenopathy or mass. No evidence of arterial or venous thrombosis or occlusion. Minimal atherosclerotic calcification of the carotid bifurcations. Salivary glands are normal. IMPRESSION: Findings as expected 4 days following ACDF. Right-sided surgical approach with some residual edema and a few small air bubbles are as expected. No sign of focal hematoma or definable abscess. Good appearance at the C5-C7 ACDF itself. Electronically Signed   By: Nelson Chimes M.D.   On: 01/02/2017 08:06   Ct Angio Chest Pe W And/or Wo Contrast  Result Date: 01/02/2017 CLINICAL DATA:  ACDF 4 days ago. Right-sided neck swelling and difficulty breathing when recumbent. EXAM: CT ANGIOGRAPHY  CHEST WITH CONTRAST TECHNIQUE: Multidetector CT imaging of the chest was performed using the standard protocol during bolus administration of intravenous contrast. Multiplanar CT image reconstructions and MIPs were obtained to evaluate the vascular anatomy. CONTRAST:  100 cc Isovue 370 COMPARISON:  Chest radiography 07/14/2016 FINDINGS: Cardiovascular: Pulmonary arterial opacification is excellent. There are no pulmonary emboli. No aortic pathology. Some visible coronary artery calcification. Heart size is normal. No pericardial fluid. Mediastinum/Nodes: No mediastinal or hilar mass or  lymphadenopathy. No superior mediastinal complication related to the previous ACDF. Lungs/Pleura: The lungs are clear.  No pleural fluid. Upper Abdomen: Normal Musculoskeletal: No bony abnormality in the chest. Previous ACDF C5-C7. Ordinary thoracic bridging osteophytes. Review of the MIP images confirms the above findings. IMPRESSION: No pulmonary emboli or acute chest pathology. Coronary artery calcification is noted. Electronically Signed   By: Nelson Chimes M.D.   On: 01/02/2017 08:09    Procedures Procedures (including critical care time)  Medications Ordered in ED Medications  morphine 4 MG/ML injection 4 mg (4 mg Intravenous Given 01/02/17 0643)  sodium chloride 0.9 % bolus 500 mL (500 mLs Intravenous New Bag/Given 01/02/17 0643)  iopamidol (ISOVUE-370) 76 % injection (100 mLs Intravenous Contrast Given 01/02/17 0731)     Initial Impression / Assessment and Plan / ED Course  I have reviewed the triage vital signs and the nursing notes.  Pertinent labs & imaging results that were available during my care of the patient were reviewed by me and considered in my medical decision making (see chart for details).     BP 134/83   Pulse 64   Temp 97.7 F (36.5 C) (Oral)   Resp 17   SpO2 94%    Final Clinical Impressions(s) / ED Diagnoses   Final diagnoses:  Post-operative pain    ED Discharge Orders     None     6:24 AM Patient here with complaints of shortness of breath, and right-sided neck swelling status post cervical fusion procedure performed approximately 1 week ago.  He does have a hematoma and some swelling to the surgical site without any obvious airway compromise, throat exam is unremarkable.  Will obtain next soft tissue CT scan for further evaluation.  Patient will also benefit from a chest CT angiogram to rule out PE due to having recent surgery, and report of having shortness of breath.  Remote history of PE.  Care discussed with Dr. Wyvonnia Dusky.  8:50 AM EKG without concerning feature, labs are reassuring, no evidence of renal insufficiency with a creatinine of 1.42, improved from prior.  Mild elevated CBG of 233 with a normal anion gap.  CT of soft tissue neck shows expected changes post surgical without concerning feature.  Chest CT angiogram without evidence of PE or acute finding.  Patient is resting comfortably.  Appreciate consultation from neurosurgeon, Dr. Cyndy Freeze who recommend patient to call for a close follow-up appointment next week.  Return precautions discussed.   Domenic Moras, PA-C 01/02/17 3664    Ezequiel Essex, MD 01/02/17 2102

## 2017-01-02 NOTE — Discharge Instructions (Signed)
Please take tylenol or ibuprofen as needed for pain.  Apply ice to affected area for 10-15 minutes as needed for swelling.  Call and follow up closely with Dr. Hewitt Shorts office next week for further care.

## 2017-01-02 NOTE — ED Triage Notes (Signed)
Patient woke up this morning with post surgical site swelling /bruise at right side of neck , discectomy with fusion by Dr. Cyndy Freeze last Monday .

## 2017-01-16 DIAGNOSIS — E785 Hyperlipidemia, unspecified: Secondary | ICD-10-CM | POA: Diagnosis not present

## 2017-01-16 DIAGNOSIS — E1165 Type 2 diabetes mellitus with hyperglycemia: Secondary | ICD-10-CM | POA: Diagnosis not present

## 2017-01-16 DIAGNOSIS — Z87891 Personal history of nicotine dependence: Secondary | ICD-10-CM | POA: Diagnosis not present

## 2017-01-16 DIAGNOSIS — Z299 Encounter for prophylactic measures, unspecified: Secondary | ICD-10-CM | POA: Diagnosis not present

## 2017-01-16 DIAGNOSIS — Z6834 Body mass index (BMI) 34.0-34.9, adult: Secondary | ICD-10-CM | POA: Diagnosis not present

## 2017-01-16 DIAGNOSIS — E1142 Type 2 diabetes mellitus with diabetic polyneuropathy: Secondary | ICD-10-CM | POA: Diagnosis not present

## 2017-01-20 DIAGNOSIS — Z981 Arthrodesis status: Secondary | ICD-10-CM | POA: Diagnosis not present

## 2017-02-21 ENCOUNTER — Other Ambulatory Visit: Payer: Self-pay | Admitting: Physician Assistant

## 2017-02-21 DIAGNOSIS — R1310 Dysphagia, unspecified: Secondary | ICD-10-CM

## 2017-02-26 ENCOUNTER — Ambulatory Visit
Admission: RE | Admit: 2017-02-26 | Discharge: 2017-02-26 | Disposition: A | Discharge status: Home / Self Care | Payer: Medicare HMO | Source: Ambulatory Visit | Attending: Physician Assistant | Admitting: Physician Assistant

## 2017-02-26 DIAGNOSIS — R1310 Dysphagia, unspecified: Secondary | ICD-10-CM | POA: Diagnosis not present

## 2017-03-11 DIAGNOSIS — M4722 Other spondylosis with radiculopathy, cervical region: Secondary | ICD-10-CM | POA: Diagnosis not present

## 2017-03-11 DIAGNOSIS — R490 Dysphonia: Secondary | ICD-10-CM | POA: Diagnosis not present

## 2017-03-11 DIAGNOSIS — R1310 Dysphagia, unspecified: Secondary | ICD-10-CM | POA: Diagnosis not present

## 2017-03-11 DIAGNOSIS — Z6832 Body mass index (BMI) 32.0-32.9, adult: Secondary | ICD-10-CM | POA: Diagnosis not present

## 2017-03-13 ENCOUNTER — Other Ambulatory Visit: Payer: Self-pay

## 2017-03-13 ENCOUNTER — Emergency Department (HOSPITAL_COMMUNITY): Payer: Medicare HMO

## 2017-03-13 ENCOUNTER — Inpatient Hospital Stay (HOSPITAL_COMMUNITY)
Admission: EM | Admit: 2017-03-13 | Discharge: 2017-03-16 | DRG: 312 | Disposition: A | Discharge status: Home / Self Care | Payer: Medicare HMO | Attending: Internal Medicine | Admitting: Internal Medicine

## 2017-03-13 ENCOUNTER — Encounter (HOSPITAL_COMMUNITY): Payer: Self-pay | Admitting: Emergency Medicine

## 2017-03-13 DIAGNOSIS — K219 Gastro-esophageal reflux disease without esophagitis: Secondary | ICD-10-CM | POA: Diagnosis not present

## 2017-03-13 DIAGNOSIS — Z89412 Acquired absence of left great toe: Secondary | ICD-10-CM | POA: Diagnosis not present

## 2017-03-13 DIAGNOSIS — Z8249 Family history of ischemic heart disease and other diseases of the circulatory system: Secondary | ICD-10-CM | POA: Diagnosis not present

## 2017-03-13 DIAGNOSIS — M797 Fibromyalgia: Secondary | ICD-10-CM | POA: Diagnosis present

## 2017-03-13 DIAGNOSIS — Z9889 Other specified postprocedural states: Secondary | ICD-10-CM

## 2017-03-13 DIAGNOSIS — I10 Essential (primary) hypertension: Secondary | ICD-10-CM | POA: Diagnosis present

## 2017-03-13 DIAGNOSIS — R072 Precordial pain: Secondary | ICD-10-CM | POA: Diagnosis not present

## 2017-03-13 DIAGNOSIS — E1151 Type 2 diabetes mellitus with diabetic peripheral angiopathy without gangrene: Secondary | ICD-10-CM | POA: Diagnosis present

## 2017-03-13 DIAGNOSIS — Z86711 Personal history of pulmonary embolism: Secondary | ICD-10-CM | POA: Diagnosis not present

## 2017-03-13 DIAGNOSIS — Z833 Family history of diabetes mellitus: Secondary | ICD-10-CM

## 2017-03-13 DIAGNOSIS — I639 Cerebral infarction, unspecified: Secondary | ICD-10-CM | POA: Diagnosis present

## 2017-03-13 DIAGNOSIS — I129 Hypertensive chronic kidney disease with stage 1 through stage 4 chronic kidney disease, or unspecified chronic kidney disease: Secondary | ICD-10-CM | POA: Diagnosis not present

## 2017-03-13 DIAGNOSIS — R0789 Other chest pain: Secondary | ICD-10-CM | POA: Diagnosis present

## 2017-03-13 DIAGNOSIS — R569 Unspecified convulsions: Secondary | ICD-10-CM | POA: Diagnosis not present

## 2017-03-13 DIAGNOSIS — E1122 Type 2 diabetes mellitus with diabetic chronic kidney disease: Secondary | ICD-10-CM | POA: Diagnosis not present

## 2017-03-13 DIAGNOSIS — R55 Syncope and collapse: Secondary | ICD-10-CM | POA: Diagnosis not present

## 2017-03-13 DIAGNOSIS — E1165 Type 2 diabetes mellitus with hyperglycemia: Secondary | ICD-10-CM | POA: Diagnosis present

## 2017-03-13 DIAGNOSIS — I5030 Unspecified diastolic (congestive) heart failure: Secondary | ICD-10-CM | POA: Diagnosis not present

## 2017-03-13 DIAGNOSIS — Z8673 Personal history of transient ischemic attack (TIA), and cerebral infarction without residual deficits: Secondary | ICD-10-CM

## 2017-03-13 DIAGNOSIS — Z7982 Long term (current) use of aspirin: Secondary | ICD-10-CM

## 2017-03-13 DIAGNOSIS — Z87442 Personal history of urinary calculi: Secondary | ICD-10-CM | POA: Diagnosis not present

## 2017-03-13 DIAGNOSIS — Z8349 Family history of other endocrine, nutritional and metabolic diseases: Secondary | ICD-10-CM | POA: Diagnosis not present

## 2017-03-13 DIAGNOSIS — E785 Hyperlipidemia, unspecified: Secondary | ICD-10-CM | POA: Diagnosis present

## 2017-03-13 DIAGNOSIS — Z794 Long term (current) use of insulin: Secondary | ICD-10-CM

## 2017-03-13 DIAGNOSIS — Z981 Arthrodesis status: Secondary | ICD-10-CM | POA: Diagnosis not present

## 2017-03-13 DIAGNOSIS — R29818 Other symptoms and signs involving the nervous system: Secondary | ICD-10-CM | POA: Diagnosis not present

## 2017-03-13 DIAGNOSIS — I25119 Atherosclerotic heart disease of native coronary artery with unspecified angina pectoris: Secondary | ICD-10-CM | POA: Diagnosis present

## 2017-03-13 DIAGNOSIS — N179 Acute kidney failure, unspecified: Secondary | ICD-10-CM | POA: Diagnosis present

## 2017-03-13 DIAGNOSIS — R200 Anesthesia of skin: Secondary | ICD-10-CM | POA: Diagnosis not present

## 2017-03-13 DIAGNOSIS — R079 Chest pain, unspecified: Secondary | ICD-10-CM | POA: Diagnosis not present

## 2017-03-13 LAB — URINALYSIS, ROUTINE W REFLEX MICROSCOPIC
BACTERIA UA: NONE SEEN
BILIRUBIN URINE: NEGATIVE
Glucose, UA: >=500 mg/dL — AB
Hgb urine dipstick: NEGATIVE
KETONES UR: NEGATIVE mg/dL
Leukocytes, UA: NEGATIVE
NITRITE: NEGATIVE
PH: 5 (ref 5.0–8.0)
PROTEIN: NEGATIVE mg/dL
RBC / HPF: NONE SEEN RBC/hpf (ref 0–5)
Specific Gravity, Urine: 1.021 (ref 1.005–1.030)

## 2017-03-13 LAB — CBC
HCT: 39 % (ref 39.0–52.0)
Hemoglobin: 12.8 g/dL — ABNORMAL LOW (ref 13.0–17.0)
MCH: 28.5 pg (ref 26.0–34.0)
MCHC: 32.8 g/dL (ref 30.0–36.0)
MCV: 86.9 fL (ref 78.0–100.0)
PLATELETS: 174 10*3/uL (ref 150–400)
RBC: 4.49 MIL/uL (ref 4.22–5.81)
RDW: 13.8 % (ref 11.5–15.5)
WBC: 7 10*3/uL (ref 4.0–10.5)

## 2017-03-13 LAB — DIFFERENTIAL
Basophils Absolute: 0.1 10*3/uL (ref 0.0–0.1)
Basophils Relative: 2 %
EOS PCT: 8 %
Eosinophils Absolute: 0.6 10*3/uL (ref 0.0–0.7)
LYMPHS ABS: 2.6 10*3/uL (ref 0.7–4.0)
LYMPHS PCT: 37 %
MONO ABS: 0.5 10*3/uL (ref 0.1–1.0)
Monocytes Relative: 8 %
Neutro Abs: 3.2 10*3/uL (ref 1.7–7.7)
Neutrophils Relative %: 45 %

## 2017-03-13 LAB — CBG MONITORING, ED: GLUCOSE-CAPILLARY: 256 mg/dL — AB (ref 65–99)

## 2017-03-13 LAB — I-STAT CHEM 8, ED
BUN: 33 mg/dL — AB (ref 6–20)
Calcium, Ion: 1.2 mmol/L (ref 1.15–1.40)
Chloride: 106 mmol/L (ref 101–111)
Creatinine, Ser: 1.6 mg/dL — ABNORMAL HIGH (ref 0.61–1.24)
Glucose, Bld: 266 mg/dL — ABNORMAL HIGH (ref 65–99)
HEMATOCRIT: 38 % — AB (ref 39.0–52.0)
HEMOGLOBIN: 12.9 g/dL — AB (ref 13.0–17.0)
Potassium: 5.3 mmol/L — ABNORMAL HIGH (ref 3.5–5.1)
SODIUM: 138 mmol/L (ref 135–145)
TCO2: 22 mmol/L (ref 22–32)

## 2017-03-13 LAB — RAPID URINE DRUG SCREEN, HOSP PERFORMED
Amphetamines: NOT DETECTED
Barbiturates: NOT DETECTED
Benzodiazepines: NOT DETECTED
Cocaine: NOT DETECTED
OPIATES: NOT DETECTED
Tetrahydrocannabinol: NOT DETECTED

## 2017-03-13 LAB — I-STAT TROPONIN, ED
TROPONIN I, POC: 0.01 ng/mL (ref 0.00–0.08)
Troponin i, poc: 0.02 ng/mL (ref 0.00–0.08)

## 2017-03-13 LAB — ETHANOL: Alcohol, Ethyl (B): <10 mg/dL (ref <10)

## 2017-03-13 LAB — COMPREHENSIVE METABOLIC PANEL
ALK PHOS: 119 U/L (ref 38–126)
ALT: 27 U/L (ref 17–63)
ANION GAP: 11 (ref 5–15)
AST: 30 U/L (ref 15–41)
Albumin: 4.3 g/dL (ref 3.5–5.0)
BILIRUBIN TOTAL: 0.3 mg/dL (ref 0.3–1.2)
BUN: 27 mg/dL — ABNORMAL HIGH (ref 6–20)
CALCIUM: 9.6 mg/dL (ref 8.9–10.3)
CO2: 20 mmol/L — ABNORMAL LOW (ref 22–32)
Chloride: 105 mmol/L (ref 101–111)
Creatinine, Ser: 1.65 mg/dL — ABNORMAL HIGH (ref 0.61–1.24)
GFR calc non Af Amer: 45 mL/min — ABNORMAL LOW (ref >60)
GFR, EST AFRICAN AMERICAN: 52 mL/min — AB (ref >60)
Glucose, Bld: 275 mg/dL — ABNORMAL HIGH (ref 65–99)
POTASSIUM: 4.6 mmol/L (ref 3.5–5.1)
SODIUM: 136 mmol/L (ref 135–145)
Total Protein: 7.7 g/dL (ref 6.5–8.1)

## 2017-03-13 LAB — PROTIME-INR
INR: 1.03
PROTHROMBIN TIME: 13.4 s (ref 11.4–15.2)

## 2017-03-13 LAB — APTT: aPTT: 29 seconds (ref 24–36)

## 2017-03-13 LAB — TROPONIN I: Troponin I: <0.03 ng/mL (ref <0.03)

## 2017-03-13 MED ORDER — ONDANSETRON HCL 4 MG/2ML IJ SOLN: 4.0000 mg | Freq: Four times a day (QID) | INTRAMUSCULAR | Status: DC | PRN

## 2017-03-13 MED ORDER — GI COCKTAIL ~~LOC~~: 30.0000 mL | Freq: Four times a day (QID) | ORAL | Status: DC | PRN

## 2017-03-13 MED ORDER — NITROGLYCERIN 0.4 MG SL SUBL: 0.4000 mg | SUBLINGUAL_TABLET | SUBLINGUAL | Status: DC | PRN

## 2017-03-13 MED ORDER — GABAPENTIN 600 MG PO TABS
2400.0000 mg | ORAL_TABLET | Freq: Every day | ORAL | Status: DC
  Administered 2017-03-13 – 2017-03-15 (×3): 2400 mg via ORAL
  Filled 2017-03-13 (×3): qty 4

## 2017-03-13 MED ORDER — ASPIRIN 81 MG PO CHEW
324.0000 mg | CHEWABLE_TABLET | Freq: Once | ORAL | Status: AC
  Administered 2017-03-13: 324 mg via ORAL
  Filled 2017-03-13: qty 4

## 2017-03-13 MED ORDER — RANOLAZINE ER 500 MG PO TB12
1000.0000 mg | ORAL_TABLET | Freq: Every day | ORAL | Status: DC
  Administered 2017-03-14 – 2017-03-16 (×3): 1000 mg via ORAL
  Filled 2017-03-13 (×3): qty 2

## 2017-03-13 MED ORDER — ASPIRIN EC 81 MG PO TBEC: 81.0000 mg | DELAYED_RELEASE_TABLET | Freq: Every day | ORAL | Status: DC

## 2017-03-13 MED ORDER — ATORVASTATIN CALCIUM 20 MG PO TABS
20.0000 mg | ORAL_TABLET | Freq: Every day | ORAL | Status: DC
  Administered 2017-03-14: 20 mg via ORAL
  Filled 2017-03-13: qty 1

## 2017-03-13 MED ORDER — ASPIRIN EC 325 MG PO TBEC
325.0000 mg | DELAYED_RELEASE_TABLET | Freq: Every day | ORAL | Status: DC
  Administered 2017-03-13 – 2017-03-16 (×4): 325 mg via ORAL
  Filled 2017-03-13 (×4): qty 1

## 2017-03-13 MED ORDER — ACETAMINOPHEN 325 MG PO TABS: 650.0000 mg | ORAL_TABLET | ORAL | Status: DC | PRN

## 2017-03-13 NOTE — Progress Notes (Signed)
Code Stroke Notes: 6195 Call time  1703 Beeper time  0932 Exam started  6712 Exam finished  1708 Images sent   exam completed   GR called

## 2017-03-13 NOTE — ED Notes (Signed)
Patient transported to MRI 

## 2017-03-13 NOTE — ED Notes (Signed)
Report given to [place], EMT-P from [facility]

## 2017-03-13 NOTE — ED Notes (Signed)
PT went to CT scan at this time with RN present at bedside.

## 2017-03-13 NOTE — ED Provider Notes (Signed)
[facility] EMERGENCY DEPARTMENT Provider Note   CSN: [id number] Arrival date & time: 03/13/17  1657   An emergency department physician performed an initial assessment on this suspected stroke patient at 78.  History   Chief Complaint Chief Complaint  Patient presents with  . Numbness    HPI [name] is a 57 y.o. male.  Patient brought in by his wife.  Patient last seen normal by him at 830 in.  He called her at 4:00 in the afternoon and she noted that he was not speaking properly and he said he was not feeling well.  Patient states he started not to feel well at around 2:00 in the afternoon had an unexplained syncopal episode while sitting in his chair woke up at around 3:15 PM.  Patient has a history of a stroke in the past 2016 that is left a little bit of left facial droop.  Wife stated the main things that are ongoing now his speech is not normal.  Still has a left-sided facial droop has numbness to the left side of the face and arm.  Also associated with some left anterior chest pain or discomfort that started around the same time.  Still present upon arrival.  Based on his symptoms and timing it seems like perhaps the onset of this was at 2 PM in the afternoon.  So code stroke was activated.  The patient sent to head CT.  In addition is possible the patient may have had a seizure.  No known history of seizures patient did not have incontinence but stated that the left side of his mouth is sore he thinks he bit it.  No bleeding.  No tongue soreness.  Patient was home alone during this timeframe.      Past Medical History:  Diagnosis Date  . Allergic rhinitis, cause unspecified   . Cervicalgia   . Chest pain Sept. 2005   multiple caths  /  cath 06/15/2010..normal coronaries,  EF 60%   (false postive nuclear in the past)  . Dyslipidemia    mixed  . Esophageal reflux   . Fibromyalgia   . Gastroparesis   . Headache(784.0)   . History of kidney stones   .  Hyperlipidemia   . Hypertension   . IDDM (insulin dependent diabetes mellitus) ([place])   . Lumbago   . Neuropathy associated with endocrine disorder ([place])   . Overweight(278.02)   . Peripheral vascular disease ([place])   . Pulmonary embolus (H[facility])    Hx of small left lower lobe  pulmonary embolus  . Pulmonary embolus ([place]) 2010ish  . Stroke [facility])    mini stroke aug 2016  . Type II or unspecified type diabetes mellitus with neurological manifestations, not stated as uncontrolled(250.60)   . Type II or unspecified type diabetes mellitus with neurological manifestations, not stated as uncontrolled(250.60)   . Urticaria, unspecified     Patient Active Problem List   Diagnosis Date Noted  . Cervical spondylosis with radiculopathy 12/29/2016  . TIA (transient ischemic attack) 08/30/2014  . 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 Neuropathy 08/30/2014  . AKI (acute kidney injury) ([place]) 08/30/2014  . Bilateral occipital neuralgia 10/30/2013  . Lumbar stenosis without neurogenic claudication 01/12/2013  . Type 2 diabetes mellitus with vascular disease ([place])   . Hypertension   . Hyperlipidemia   . Gastroparesis   . Pulmonary embolus ([place])   . Obesity   . GERD 01/14/2010  . Chest pain 10/05/2003  Past Surgical History:  Procedure Laterality Date  . ACHILLES TENDON REPAIR Left 2013  . AMPUTATION Left 06/02/2013   Procedure: AMPUTATION 1ST TOE LEFT FOOT;  Surgeon: Marcheta Grammes, DPM;  Location: [facility];  Service: Orthopedics;  Laterality: Left;  . AMPUTATION Left 07/21/2013   Procedure: PARTIAL AMPUTATION 2ND TOE LEFT FOOT;  Surgeon: Marcheta Grammes, DPM;  Location: [facility];  Service: Podiatry;  Laterality: Left;  . ANTERIOR CERVICAL DECOMP/DISCECTOMY FUSION N/A 12/29/2016   Procedure: Cervical five-six Cervical six-seven Anterior cervical discectomy with fusion and plate fixation;  Surgeon: Ditty, Kevan Ny, MD;  Location: [place];  Service: Neurosurgery;   Laterality: N/A;  . APPENDECTOMY    . BACK SURGERY  1999   x3  . FOOT ARTHRODESIS Right 01/11/2014   Procedure: ARTHRODESIS INTERPHALANGEAL JOINT HALLUX RIGHT FOOT;  Surgeon: Marcheta Grammes, DPM;  Location: [facility];  Service: Podiatry;  Laterality: Right;  . HARDWARE REMOVAL Right 01/17/2015   Procedure: HARDWARE REMOVAL;  Surgeon: Caprice Beaver, DPM;  Location: [facility];  Service: Podiatry;  Laterality: Right;  . KNEE ARTHROSCOPY Right 10/2015  . LUMBAR LAMINECTOMY/DECOMPRESSION MICRODISCECTOMY Right 01/12/2013   Procedure: Right Lumbar Three-Four Laminotomy/Foraminotomy;  Surgeon: Floyce Stakes, MD;  Location: [facility];  Service: Neurosurgery;  Laterality: Right;  Right Lumbar Three-Four Laminotomy/Foraminotomy  . NISSEN FUNDOPLICATION    . SHOULDER ARTHROSCOPY W/ ROTATOR CUFF REPAIR Right        Home Medications    Prior to Admission medications   Medication Sig Start Date End Date Taking? Authorizing Provider  aspirin EC 81 MG tablet Take 81 mg by mouth daily.   Yes [provider]  atorvastatin (LIPITOR) 20 MG tablet Take 20 mg by mouth daily.   Yes [provider]  gabapentin (NEURONTIN) 600 MG tablet TAKE 4 TABLETS BY MOUTH AT BEDTIME. Patient taking differently: TAKE 2400 MG BY MOUTH AT BEDTIME. 02/18/16  Yes Nida, Marella Chimes, MD  LEVEMIR 100 UNIT/ML injection INJECT 60 UNITS INTO THE SKIN AT BEDTIME 03/17/16  Yes Nida, Marella Chimes, MD  metFORMIN (GLUCOPHAGE) 500 MG tablet Take 1 tablet (500 mg total) by mouth 2 (two) times daily with a meal. 2 pills 2 times a day Patient taking differently: Take 1,000 mg 2 (two) times daily with a meal by mouth.  06/20/15  Yes Cassandria Anger, MD  RANEXA 1000 MG SR tablet TAKE 1 TABLET BY MOUTH DAILY 12/30/16  Yes Herminio Commons, MD  canagliflozin (INVOKANA) 100 MG TABS tablet Take 1 tablet (100 mg total) by mouth daily. Patient not taking: Reported on 12/17/2016 06/09/16   Cassandria Anger, MD  methocarbamol (ROBAXIN) 750 MG tablet Take 1 tablet (750 mg total) by mouth 4 (four) times daily. Patient not taking: Reported on 03/13/2017 12/30/16   Ditty, Kevan Ny, MD    Family History Family History  Problem Relation Age of Onset  . Heart attack Father 24  . Hyperlipidemia Father   . Hypertension Father   . Heart attack Mother 44  . Diabetes Mother   . Hypertension Mother   . Hyperlipidemia Mother   . Healthy Brother   . Seizures Brother   . Migraines Neg Hx     Social History Social History   Tobacco Use  . Smoking status: Former Smoker    Packs/day: 3.00    Years: 5.00    Pack years: 15.00    Types: Cigarettes    Start date: 06/20/1977    Last attempt to  quit: 07/20/1982    Years since quitting: 34.6  . Smokeless tobacco: Never Used  . Tobacco comment: quit 1980's  Substance Use Topics  . Alcohol use: No    Alcohol/week: 0.0 oz  . Drug use: No     Allergies   Reglan [metoclopramide]   Review of Systems Review of Systems  Constitutional: Negative for fever.  HENT: Negative for congestion.   Eyes: Negative for visual disturbance.  Respiratory: Negative for shortness of breath.   Cardiovascular: Positive for chest pain.  Gastrointestinal: Negative for abdominal pain and nausea.  Genitourinary: Negative for dysuria.  Musculoskeletal: Negative for back pain and neck pain.  Skin: Negative for rash.  Neurological: Positive for syncope, speech difficulty and numbness. Negative for headaches.  Hematological: Does not bruise/bleed easily.  Psychiatric/Behavioral: Negative for confusion.     Physical Exam Updated Vital Signs BP (!) 143/85   Pulse 62   Temp 97.7 F (36.5 C)   Resp 11   SpO2 100%   Physical Exam  Constitutional: He is oriented to person, place, and time. He appears well-developed and well-nourished. No distress.  HENT:  Head: Normocephalic and atraumatic.  Mouth/Throat: Oropharynx is clear and moist.  Eyes: EOM are  normal. Pupils are equal, round, and reactive to light.  Neck: Normal range of motion. Neck supple.  Cardiovascular: Normal rate, regular rhythm and normal heart sounds.  Pulmonary/Chest: Effort normal and breath sounds normal. No respiratory distress.  Abdominal: Soft. Bowel sounds are normal.  Musculoskeletal: Normal range of motion. He exhibits no edema.  Neurological: He is alert and oriented to person, place, and time. A cranial nerve deficit and sensory deficit is present. He exhibits normal muscle tone. Coordination normal.  Subjective numbness to left side of face and left arm.  Speech appears to be not normal.  Left facial droop.  Skin: Skin is warm.  Nursing note and vitals reviewed.    ED Treatments / Results  Labs (all labs ordered are listed, but only abnormal results are displayed) Labs Reviewed  CBC - Abnormal; Notable for the following components:      Result Value   Hemoglobin 12.8 (*)    All other components within normal limits  COMPREHENSIVE METABOLIC PANEL - Abnormal; Notable for the following components:   CO2 20 (*)    Glucose, Bld 275 (*)    BUN 27 (*)    Creatinine, Ser 1.65 (*)    GFR calc non Af Amer 45 (*)    GFR calc Af Amer 52 (*)    All other components within normal limits  URINALYSIS, ROUTINE W REFLEX MICROSCOPIC - Abnormal; Notable for the following components:   Glucose, UA >=500 (*)    Squamous Epithelial / LPF 0-5 (*)    All other components within normal limits  CBG MONITORING, ED - Abnormal; Notable for the following components:   Glucose-Capillary 256 (*)    All other components within normal limits  I-STAT CHEM 8, ED - Abnormal; Notable for the following components:   Potassium 5.3 (*)    BUN 33 (*)    Creatinine, Ser 1.60 (*)    Glucose, Bld 266 (*)    Hemoglobin 12.9 (*)    HCT 38.0 (*)    All other components within normal limits  ETHANOL  PROTIME-INR  APTT  DIFFERENTIAL  RAPID URINE DRUG SCREEN, HOSP PERFORMED  I-STAT  TROPONIN, ED  I-STAT TROPONIN, ED    EKG  EKG Interpretation  Date/Time:  Friday March 13 2017  17:03:59 EST Ventricular Rate:  75 PR Interval:    QRS Duration: 93 QT Interval:  357 QTC Calculation: 399 R Axis:   51 Text Interpretation:  Sinus rhythm Interpretation limited secondary to artifact Otherwise no significant change Confirmed by Fredia Sorrow [phone]) on 03/13/2017 5:10:17 PM       Radiology Dg Chest 2 View  Result Date: 03/13/2017 CLINICAL DATA:  Chest pain. EXAM: CHEST  2 VIEW COMPARISON:  July 14, 2016 FINDINGS: The heart size and mediastinal contours are within normal limits. Both lungs are clear. The visualized skeletal structures are unremarkable. IMPRESSION: No active cardiopulmonary disease. Electronically Signed   By: Dorise Bullion III M.D   On: 03/13/2017 19:41   Mr Brain Wo Contrast (neuro Protocol)  Result Date: 03/13/2017 CLINICAL DATA:  Seizure.  Left arm numbness and left facial droop. EXAM: MRI HEAD WITHOUT CONTRAST TECHNIQUE: Multiplanar, multiecho pulse sequences of the brain and surrounding structures were obtained without intravenous contrast. COMPARISON:  None. FINDINGS: Brain: The midline structures are normal. There is no acute infarct or acute hemorrhage. No mass lesion, hydrocephalus, dural abnormality or extra-axial collection. The brain parenchymal signal is normal. No age-advanced or lobar predominant atrophy. No chronic microhemorrhage or superficial siderosis. Vascular: Major intracranial arterial and venous sinus flow voids are preserved. Skull and upper cervical spine: The visualized skull base, calvarium, upper cervical spine and extracranial soft tissues are normal. Sinuses/Orbits: No fluid levels or advanced mucosal thickening. No mastoid or middle ear effusion. Normal orbits. IMPRESSION: Normal brain MRI. Electronically Signed   By: Ulyses Jarred M.D.   On: 03/13/2017 18:36   Ct Head Code Stroke Wo Contrast  Result Date: 03/13/2017 CLINICAL  DATA:  Code stroke. Left facial and arm numbness. Slurred speech. EXAM: CT HEAD WITHOUT CONTRAST TECHNIQUE: Contiguous axial images were obtained from the base of the skull through the vertex without intravenous contrast. COMPARISON:  None FINDINGS: Brain: No mass lesion or acute hemorrhage. No focal hypoattenuation of the basal ganglia or cortex to indicate infarcted tissue. No hydrocephalus or age advanced atrophy. Vascular: No hyperdense vessel. No advanced atherosclerotic calcification of the arteries at the skull base. Skull: Normal visualized skull base, calvarium and extracranial soft tissues. Sinuses/Orbits: No sinus fluid levels or advanced mucosal thickening. No mastoid effusion. Normal orbits. ASPECTS [facility] Stroke Program Early CT Score) - Ganglionic level infarction (caudate, lentiform nuclei, internal capsule, insula, M1-M3 cortex): 7 - Supraganglionic infarction (M4-M6 cortex): 3 Total score (0-10 with 10 being normal): 10 IMPRESSION: 1. No acute hemorrhage or mass lesion. 2. ASPECTS is 10. These results were called by telephone at the time of interpretation on 03/13/2017 at 5:20 pm to Dr. Fredia Sorrow , who verbally acknowledged these results. Electronically Signed   By: Ulyses Jarred M.D.   On: 03/13/2017 17:22    Procedures Procedures (including critical care time)  CRITICAL CARE Performed by: Fredia Sorrow Total critical care time: 30 minutes Critical care time was exclusive of separately billable procedures and treating other patients. Critical care was necessary to treat or prevent imminent or life-threatening deterioration. Critical care was time spent personally by me on the following activities: development of treatment plan with patient and/or surrogate as well as nursing, discussions with consultants, evaluation of patient's response to treatment, examination of patient, obtaining history from patient or surrogate, ordering and performing treatments and interventions,  ordering and review of laboratory studies, ordering and review of radiographic studies, pulse oximetry and re-evaluation of patient's condition.  Medications Ordered in ED Medications  aspirin chewable  tablet 324 mg (324 mg Oral Given 03/13/17 1738)     Initial Impression / Assessment and Plan / ED Course  I have reviewed the triage vital signs and the nursing notes.  Pertinent labs & imaging results that were available during my care of the patient were reviewed by me and considered in my medical decision making (see chart for details).    Patient presented with possible stroke possible seizure possible syncope and and possible cardiac chest pain.  Code stroke was activated based on timing.  Head CT negative evaluated by tele-neurologist.  TPA appropriately not recommended.  Went on to have MRI without any acute findings.  For the chest pain which was a left anterior chest discomfort is improved significantly but not completely gone patient's troponins have been negative x2, chest x-ray negative EKG without acute changes.  Patient also possibly could have had a seizure although no incontinence.  Syncope probably not brought on by cardiac event because he was out for so long.  Also with negative MRI and since he still has the left arm and face numbness unlikely to be stroke.  Discussed with hospitalist here.  He will transfer patient to [facility] for admission to hospitalist service therefore cardiac monitoring and possible EEG.  Patient also will need continued chest pain rule out.   Final Clinical Impressions(s) / ED Diagnoses   Final diagnoses:  Syncope, unspecified syncope type  Precordial pain  Numbness    ED Discharge Orders    None       Fredia Sorrow, MD 03/13/17 2037

## 2017-03-13 NOTE — ED Notes (Signed)
ED provider at bedside.

## 2017-03-13 NOTE — ED Triage Notes (Signed)
Pt states losing an hour and cant remember that hour.  S/s are numbness in left arm and hand, left side of face is drooped, slurred speech according to family member, decreased sensation in left arm and hand. LKW was 1400

## 2017-03-13 NOTE — H&P (Signed)
History and Physical    DAEMIAN GAHM ITG:[id number] DOB: July 14, 1960 DOA: 03/13/2017  PCP: Arsenio Katz, NP  Patient coming from: Home  Chief Complaint: Passed out  HPI: [name] is a 57 y.o. male with medical history significant of he reports a previous CVA with residual left-sided facial drooping however his MRI is normal today, cervical radiculopathy status post fusion C-spine end of last year, chronic chest pain with multiple caths in the past on Ranexa, hypertension, peripheral vascular disease, PE previous to 2012 comes in with an episode that occurred earlier today where he woke up after passing out over an hour.  Patient reports he started to have left-sided chest pain that then turned into left-sided numbness in his left arm and leg associated with dysphasia.  He says he all of a sudden passed out and when he came to it was over an hour later.  He was sitting in his recliner when this happened.  He states that he has a sore in his tongue that he possibly could have bitten.  There was no urinary or bowel incontinence.  He has not had any recent illnesses.  No fevers.  No headache.  His symptoms have resolved except for his left facial droop that is always there.  He denies any of the chest pain at this time.  He denies any nausea vomiting diarrhea.  Patient is referred for admission for cardiac monitoring and EEG evaluation.  Review of Systems: As per HPI otherwise 10 point review of systems negative.   Past Medical History:  Diagnosis Date  . Allergic rhinitis, cause unspecified   . Cervicalgia   . Chest pain Sept. 2005   multiple caths  /  cath 06/15/2010..normal coronaries,  EF 60%   (false postive nuclear in the past)  . Dyslipidemia    mixed  . Esophageal reflux   . Fibromyalgia   . Gastroparesis   . Headache(784.0)   . History of kidney stones   . Hyperlipidemia   . Hypertension   . IDDM (insulin dependent diabetes mellitus) ([place])   . Lumbago   . Neuropathy  associated with endocrine disorder ([place])   . Overweight(278.02)   . Peripheral vascular disease ([place])   . Pulmonary embolus (HCC)    Hx of small left lower lobe  pulmonary embolus  . Pulmonary embolus ([place]) 2010ish  . Stroke [facility])    mini stroke aug 2016  . Type II or unspecified type diabetes mellitus with neurological manifestations, not stated as uncontrolled(250.60)   . Type II or unspecified type diabetes mellitus with neurological manifestations, not stated as uncontrolled(250.60)   . Urticaria, unspecified     Past Surgical History:  Procedure Laterality Date  . ACHILLES TENDON REPAIR Left 2013  . AMPUTATION Left 06/02/2013   Procedure: AMPUTATION 1ST TOE LEFT FOOT;  Surgeon: Marcheta Grammes, DPM;  Location: [facility];  Service: Orthopedics;  Laterality: Left;  . AMPUTATION Left 07/21/2013   Procedure: PARTIAL AMPUTATION 2ND TOE LEFT FOOT;  Surgeon: Marcheta Grammes, DPM;  Location: [facility];  Service: Podiatry;  Laterality: Left;  . ANTERIOR CERVICAL DECOMP/DISCECTOMY FUSION N/A 12/29/2016   Procedure: Cervical five-six Cervical six-seven Anterior cervical discectomy with fusion and plate fixation;  Surgeon: Ditty, Kevan Ny, MD;  Location: [facility];  Service: Neurosurgery;  Laterality: N/A;  . APPENDECTOMY    . BACK SURGERY  1999   x3  . FOOT ARTHRODESIS Right 01/11/2014   Procedure: ARTHRODESIS INTERPHALANGEAL JOINT HALLUX RIGHT FOOT;  Surgeon:  Marcheta Grammes, DPM;  Location: [facility];  Service: Podiatry;  Laterality: Right;  . HARDWARE REMOVAL Right 01/17/2015   Procedure: HARDWARE REMOVAL;  Surgeon: Caprice Beaver, DPM;  Location: [facility];  Service: Podiatry;  Laterality: Right;  . KNEE ARTHROSCOPY Right 10/2015  . LUMBAR LAMINECTOMY/DECOMPRESSION MICRODISCECTOMY Right 01/12/2013   Procedure: Right Lumbar Three-Four Laminotomy/Foraminotomy;  Surgeon: Floyce Stakes, MD;  [facility];  Service: Neurosurgery;  Laterality: Right;  Right Lumbar  Three-Four Laminotomy/Foraminotomy  . NISSEN FUNDOPLICATION    . SHOULDER ARTHROSCOPY W/ ROTATOR CUFF REPAIR Right      reports that he quit smoking about 34 years ago. His smoking use included cigarettes. He started smoking about 39 years ago. He has a 15.00 pack-year smoking history. he has never used smokeless tobacco. He reports that he does not drink alcohol or use drugs.  Allergies  Allergen Reactions  . Reglan [Metoclopramide] Itching and Rash    Family History  Problem Relation Age of Onset  . Heart attack Father 46  . Hyperlipidemia Father   . Hypertension Father   . Heart attack Mother 41  . Diabetes Mother   . Hypertension Mother   . Hyperlipidemia Mother   . Healthy Brother   . Seizures Brother   . Migraines Neg Hx     Prior to Admission medications   Medication Sig Start Date End Date Taking? Authorizing Provider  aspirin EC 81 MG tablet Take 81 mg by mouth daily.   Yes [provider]  atorvastatin (LIPITOR) 20 MG tablet Take 20 mg by mouth daily.   Yes [provider]  gabapentin (NEURONTIN) 600 MG tablet TAKE 4 TABLETS BY MOUTH AT BEDTIME. Patient taking differently: TAKE 2400 MG BY MOUTH AT BEDTIME. 02/18/16  Yes Nida, Marella Chimes, MD  LEVEMIR 100 UNIT/ML injection INJECT 60 UNITS INTO THE SKIN AT BEDTIME 03/17/16  Yes Nida, Marella Chimes, MD  metFORMIN (GLUCOPHAGE) 500 MG tablet Take 1 tablet (500 mg total) by mouth 2 (two) times daily with a meal. 2 pills 2 times a day Patient taking differently: Take 1,000 mg 2 (two) times daily with a meal by mouth.  06/20/15  Yes Cassandria Anger, MD  RANEXA 1000 MG SR tablet TAKE 1 TABLET BY MOUTH DAILY 12/30/16  Yes Herminio Commons, MD  canagliflozin (INVOKANA) 100 MG TABS tablet Take 1 tablet (100 mg total) by mouth daily. Patient not taking: Reported on 12/17/2016 06/09/16   Cassandria Anger, MD  methocarbamol (ROBAXIN) 750 MG tablet Take 1 tablet (750 mg total) by mouth 4 (four) times  daily. Patient not taking: Reported on 03/13/2017 12/30/16   Ditty, Kevan Ny, MD    Physical Exam: Vitals:   03/13/17 1941 03/13/17 2000 03/13/17 2030 03/13/17 2051  BP: 127/87 (!) 143/85 136/84   Pulse: 68 62  67  Resp: 19 11  20   Temp:      TempSrc:      SpO2: 100% 100%  100%      Constitutional: NAD, calm, comfortable Vitals:   03/13/17 1941 03/13/17 2000 03/13/17 2030 03/13/17 2051  BP: 127/87 (!) 143/85 136/84   Pulse: 68 62  67  Resp: 19 11  20   Temp:      TempSrc:      SpO2: 100% 100%  100%   Eyes: PERRL, lids and conjunctivae normal ENMT: Mucous membranes are moist. Posterior pharynx clear of any exudate or lesions.Normal dentition.  Neck: normal, supple, no masses, no thyromegaly Respiratory: clear  to auscultation bilaterally, no wheezing, no crackles. Normal respiratory effort. No accessory muscle use.  Cardiovascular: Regular rate and rhythm, no murmurs / rubs / gallops. No extremity edema. 2+ pedal pulses. No carotid bruits.  Abdomen: no tenderness, no masses palpated. No hepatosplenomegaly. Bowel sounds positive.  Musculoskeletal: no clubbing / cyanosis. No joint deformity upper and lower extremities. Good ROM, no contractures. Normal muscle tone.  Skin: no rashes, lesions, ulcers. No induration Neurologic: Left facial droop otherwise normal cranial nerves II through XII. Sensation intact, DTR normal. Strength 5/5 in all 4.  Psychiatric: Normal judgment and insight. Alert and oriented x 3. Normal mood.    Labs on Admission: I have personally reviewed following labs and imaging studies  CBC: Recent Labs  Lab 03/13/17 1700 03/13/17 1714  WBC 7.0  --   NEUTROABS 3.2  --   HGB 12.8* 12.9*  HCT 39.0 38.0*  MCV 86.9  --   PLT 174  --    Basic Metabolic Panel: Recent Labs  Lab 03/13/17 1700 03/13/17 1714  NA 136 138  K 4.6 5.3*  CL 105 106  CO2 20*  --   GLUCOSE 275* 266*  BUN 27* 33*  CREATININE 1.65* 1.60*  CALCIUM 9.6  --    GFR: CrCl  cannot be calculated (Unknown ideal weight.). Liver Function Tests: Recent Labs  Lab 03/13/17 1700  AST 30  ALT 27  ALKPHOS 119  BILITOT 0.3  PROT 7.7  ALBUMIN 4.3   No results for input(s): LIPASE, AMYLASE in the last 168 hours. No results for input(s): AMMONIA in the last 168 hours. Coagulation Profile: Recent Labs  Lab 03/13/17 1700  INR 1.03   Cardiac Enzymes: No results for input(s): CKTOTAL, CKMB, CKMBINDEX, TROPONINI in the last 168 hours. BNP (last 3 results) No results for input(s): PROBNP in the last 8760 hours. HbA1C: No results for input(s): HGBA1C in the last 72 hours. CBG: Recent Labs  Lab 03/13/17 1703  GLUCAP 256*   Lipid Profile: No results for input(s): CHOL, HDL, LDLCALC, TRIG, CHOLHDL, LDLDIRECT in the last 72 hours. Thyroid Function Tests: No results for input(s): TSH, T4TOTAL, FREET4, T3FREE, THYROIDAB in the last 72 hours. Anemia Panel: No results for input(s): VITAMINB12, FOLATE, FERRITIN, TIBC, IRON, RETICCTPCT in the last 72 hours. Urine analysis:    Component Value Date/Time   COLORURINE YELLOW 03/13/2017 [place] 03/13/2017 1746   LABSPEC 1.021 03/13/2017 1746   PHURINE 5.0 03/13/2017 1746   GLUCOSEU >=500 (A) 03/13/2017 1746   HGBUR NEGATIVE 03/13/2017 1746   BILIRUBINUR NEGATIVE 03/13/2017 1746   KETONESUR NEGATIVE 03/13/2017 1746   PROTEINUR NEGATIVE 03/13/2017 1746   UROBILINOGEN 0.2 08/30/2014 1443   NITRITE NEGATIVE 03/13/2017 1746   LEUKOCYTESUR NEGATIVE 03/13/2017 1746   Sepsis Labs: !!!!!!!!!!!!!!!!!!!!!!!!!!!!!!!!!!!!!!!!!!!! @LABRCNTIP (procalcitonin:4,lacticidven:4) )No results found for this or any previous visit (from the past 240 hour(s)).   Radiological Exams on Admission: Dg Chest 2 View  Result Date: 03/13/2017 CLINICAL DATA:  Chest pain. EXAM: CHEST  2 VIEW COMPARISON:  July 14, 2016 FINDINGS: The heart size and mediastinal contours are within normal limits. Both lungs are clear. The visualized  skeletal structures are unremarkable. IMPRESSION: No active cardiopulmonary disease. Electronically Signed   By: Dorise Bullion III M.D   On: 03/13/2017 19:41   Mr Brain Wo Contrast (neuro Protocol)  Result Date: 03/13/2017 CLINICAL DATA:  Seizure.  Left arm numbness and left facial droop. EXAM: MRI HEAD WITHOUT CONTRAST TECHNIQUE: Multiplanar, multiecho pulse sequences of the brain  and surrounding structures were obtained without intravenous contrast. COMPARISON:  None. FINDINGS: Brain: The midline structures are normal. There is no acute infarct or acute hemorrhage. No mass lesion, hydrocephalus, dural abnormality or extra-axial collection. The brain parenchymal signal is normal. No age-advanced or lobar predominant atrophy. No chronic microhemorrhage or superficial siderosis. Vascular: Major intracranial arterial and venous sinus flow voids are preserved. Skull and upper cervical spine: The visualized skull base, calvarium, upper cervical spine and extracranial soft tissues are normal. Sinuses/Orbits: No fluid levels or advanced mucosal thickening. No mastoid or middle ear effusion. Normal orbits. IMPRESSION: Normal brain MRI. Electronically Signed   By: Ulyses Jarred M.D.   On: 03/13/2017 18:36   Ct Head Code Stroke Wo Contrast  Result Date: 03/13/2017 CLINICAL DATA:  Code stroke. Left facial and arm numbness. Slurred speech. EXAM: CT HEAD WITHOUT CONTRAST TECHNIQUE: Contiguous axial images were obtained from the base of the skull through the vertex without intravenous contrast. COMPARISON:  None FINDINGS: Brain: No mass lesion or acute hemorrhage. No focal hypoattenuation of the basal ganglia or cortex to indicate infarcted tissue. No hydrocephalus or age advanced atrophy. Vascular: No hyperdense vessel. No advanced atherosclerotic calcification of the arteries at the skull base. Skull: Normal visualized skull base, calvarium and extracranial soft tissues. Sinuses/Orbits: No sinus fluid levels or  advanced mucosal thickening. No mastoid effusion. Normal orbits. ASPECTS [facility] Stroke Program Early CT Score) - Ganglionic level infarction (caudate, lentiform nuclei, internal capsule, insula, M1-M3 cortex): 7 - Supraganglionic infarction (M4-M6 cortex): 3 Total score (0-10 with 10 being normal): 10 IMPRESSION: 1. No acute hemorrhage or mass lesion. 2. ASPECTS is 10. These results were called by telephone at the time of interpretation on 03/13/2017 at 5:20 pm to Dr. Fredia Sorrow , who verbally acknowledged these results. Electronically Signed   By: Ulyses Jarred M.D.   On: 03/13/2017 17:22    EKG: Independently reviewed.  Normal sinus rhythm no acute changes Old chart reviewed Case discussed with EDP Chest x-ray reviewed no edema or infiltrate  Assessment/Plan 57 year old male with what sounds like a syncopal episode cannot rule out seizure also with chest pain Principal Problem:   Syncope-patient has no new focal logical deficits at this time.  Telemetry neurology was consulted who recommended an MRI in the ED.  His MRI of his brain is normal.  Which raises the question whether or not he ever had a CVA in the past resulting in his left facial droop.  Will transfer to count obtain EEG in the morning to rule out seizure.  Obtain cardiac echo.  Observe on telemetry monitoring.  We will troponin.  Active Problems:   GERD-stable   Hypertension-stable   CVA (cerebral vascular accident) [facility]) old per patient report-continue aspirin with normal MRI however   H/O cervical spine surgery-noted Atypical chest pain-serial troponin.  Obtain cardiac echo.   DVT prophylaxis: SCDs Code Status: Full Family Communication: Wife Disposition Plan: Per day team Consults called: None Admission status: Observation   DAVID,RACHAL A MD [facility]  If 7PM-7AM, please contact night-coverage [email or web address] Password [facility]  03/13/2017, 8:58 PM

## 2017-03-13 NOTE — Consult Note (Signed)
[facility] TeleNeurology Consult Services   Asked to see this patient in telemedicine consultation. Consultation was performed with assistance of ancillary / medical staff at bedside. ?   Verbal consent to perform the examination with telemedicine was obtained. Patient and family agreed to proceed with the consultation.   Impression: LUE numbness  LUE numbness since 2 PM  ? Spell/LOC for over an hour  Woke up with some mild oral trauma  NIHSS 1 for the isolated numbness LUE  Stroke/TIA v seizure v other peripheral process  Not a tpa candidate due to:  Mild/non-disabling symptoms  Not an NIR candidate due to:  Pattern of symptoms not consistent with LVO  Differential Diagnosis:   Stroke  TIA  Seizure  Migraine   Metrics:  Arrival time:  1657  [facility] contacted:  1709  [facility] at bedside:  1718  NIHSS assessment time:  1720  Recommendations:    Antiplatelet therapy with ASA  DVT Prophylaxis  Dysphagia Screen  Inpatient diagnostic testing to consider includes:  MRI brain in ED  If admitted, Inpatient neurology consultation  Discussed with ED MD     CC:  Stroke Alert  Date of Consult:  03/13/17    HPI:  Patient in usual state of health today  Was sitting on couch resting  At 1400 sudden onset of burning/tingling LUE  Then had a loss of consciousness  Woke up at 1520 with persistent numnbness, also noted he bit his gum on L side of mouth  To ER for further evaluation  Arrived at 1657  Call to teleneuro at [facility] at 1718  eval at 1720  NIHSS 1  Low NIHSS with non-disabling symptoms --> no tpA or intervention indicated   Past Medical History:  Diagnosis Date  . Allergic rhinitis, cause unspecified   . Cervicalgia   . Chest pain Sept. 2005   multiple caths  /  cath 06/15/2010..normal coronaries,  EF 60%   (false postive nuclear in the past)  . Dyslipidemia    mixed  . Esophageal reflux    . Fibromyalgia   . Gastroparesis   . Headache(784.0)   . History of kidney stones   . Hyperlipidemia   . Hypertension   . IDDM (insulin dependent diabetes mellitus) ([place])   . Lumbago   . Neuropathy associated with endocrine disorder ([place])   . Overweight(278.02)   . Peripheral vascular disease ([place])   . Pulmonary embolus (HCC)    Hx of small left lower lobe  pulmonary embolus  . Pulmonary embolus ([place]) 2010ish  . Stroke [facility])    mini stroke aug 2016  . Type II or unspecified type diabetes mellitus with neurological manifestations, not stated as uncontrolled(250.60)   . Type II or unspecified type diabetes mellitus with neurological manifestations, not stated as uncontrolled(250.60)   . Urticaria, unspecified     Prior to Admission medications   Medication Sig Start Date End Date Taking? Authorizing Provider  aspirin EC 81 MG tablet Take 81 mg by mouth daily.    [provider]  atorvastatin (LIPITOR) 20 MG tablet Take 20 mg by mouth daily.    [provider]  canagliflozin (INVOKANA) 100 MG TABS tablet Take 1 tablet (100 mg total) by mouth daily. Patient not taking: Reported on 12/17/2016 06/09/16   Cassandria Anger, MD  gabapentin (NEURONTIN) 600 MG tablet TAKE 4 TABLETS BY MOUTH AT BEDTIME. Patient taking differently: TAKE 2400 MG BY MOUTH AT BEDTIME. 02/18/16   Loni Beckwith  W, MD  LEVEMIR 100 UNIT/ML injection INJECT 60 UNITS INTO THE SKIN AT BEDTIME 03/17/16   Cassandria Anger, MD  metFORMIN (GLUCOPHAGE) 500 MG tablet Take 1 tablet (500 mg total) by mouth 2 (two) times daily with a meal. 2 pills 2 times a day Patient taking differently: Take 1,000 mg 2 (two) times daily with a meal by mouth.  06/20/15   Cassandria Anger, MD  methocarbamol (ROBAXIN) 750 MG tablet Take 1 tablet (750 mg total) by mouth 4 (four) times daily. 12/30/16   Ditty, Kevan Ny, MD  RANEXA 1000 MG SR tablet TAKE 1 TABLET BY MOUTH DAILY 12/30/16   Herminio Commons,  MD     Allergies  Allergen Reactions  . Reglan [Metoclopramide] Itching and Rash    Social History   Tobacco Use  . Smoking status: Former Smoker    Packs/day: 3.00    Years: 5.00    Pack years: 15.00    Types: Cigarettes    Start date: 06/20/1977    Last attempt to quit: 07/20/1982    Years since quitting: 34.6  . Smokeless tobacco: Never Used  . Tobacco comment: quit 1980's  Substance Use Topics  . Alcohol use: No    Alcohol/week: 0.0 oz  . Drug use: No    Family History  Problem Relation Age of Onset  . Heart attack Father 62  . Hyperlipidemia Father   . Hypertension Father   . Heart attack Mother 104  . Diabetes Mother   . Hypertension Mother   . Hyperlipidemia Mother   . Healthy Brother   . Seizures Brother   . Migraines Neg Hx         Physical Exam  Vitals:   03/13/17 1707  BP: (!) 161/97  Pulse: 77  Resp: 17  Temp: 97.7 F (36.5 C)  SpO2: 99%     NIHSS  LOC - 0  LOC questions - 0  LOC commands - 0  EOM - 0  VF - 0  Face - 0  Motor Upper Ext - 0  Motor Lower Ext - 0  Coordination - 0  Sensory - 1  Language - 0  Speech - 0  Extinction - 0  NIHSS total - 1   Lab/Data Review  CT Head - reviewed - no acute process      Medical Decision Making:  - Extensive number of diagnosis or management options are considered above.   - Extensive amount of complex data reviewed.   - High risk of complication and/or morbidity or mortality are associated with differential diagnostic considerations above.  - There may be Uncertain outcome and increased probability of prolonged functional impairment or high probability of severe prolonged functional impairment associated with some of these differential diagnosis.  Medical Data Reviewed:  1.Data reviewed include clinical labs, radiology,  Medical Tests;   2.Tests results discussed w/performing or interpreting physician;   3.Obtaining/reviewing old medical records;  4.Obtaining case  history from another source;  5.Independent review of image, tracing or specimen.

## 2017-03-14 ENCOUNTER — Observation Stay (HOSPITAL_BASED_OUTPATIENT_CLINIC_OR_DEPARTMENT_OTHER): Payer: Medicare HMO

## 2017-03-14 DIAGNOSIS — Z833 Family history of diabetes mellitus: Secondary | ICD-10-CM | POA: Diagnosis not present

## 2017-03-14 DIAGNOSIS — Z87442 Personal history of urinary calculi: Secondary | ICD-10-CM | POA: Diagnosis not present

## 2017-03-14 DIAGNOSIS — R200 Anesthesia of skin: Secondary | ICD-10-CM | POA: Diagnosis not present

## 2017-03-14 DIAGNOSIS — I5030 Unspecified diastolic (congestive) heart failure: Secondary | ICD-10-CM

## 2017-03-14 DIAGNOSIS — R072 Precordial pain: Secondary | ICD-10-CM | POA: Diagnosis not present

## 2017-03-14 DIAGNOSIS — Z981 Arthrodesis status: Secondary | ICD-10-CM | POA: Diagnosis not present

## 2017-03-14 DIAGNOSIS — R55 Syncope and collapse: Secondary | ICD-10-CM | POA: Diagnosis not present

## 2017-03-14 DIAGNOSIS — R0789 Other chest pain: Secondary | ICD-10-CM | POA: Diagnosis present

## 2017-03-14 DIAGNOSIS — M797 Fibromyalgia: Secondary | ICD-10-CM | POA: Diagnosis present

## 2017-03-14 DIAGNOSIS — I10 Essential (primary) hypertension: Secondary | ICD-10-CM | POA: Diagnosis not present

## 2017-03-14 LAB — GLUCOSE, CAPILLARY
GLUCOSE-CAPILLARY: 175 mg/dL — AB (ref 65–99)
GLUCOSE-CAPILLARY: 286 mg/dL — AB (ref 65–99)
Glucose-Capillary: 156 mg/dL — ABNORMAL HIGH (ref 65–99)

## 2017-03-14 LAB — BASIC METABOLIC PANEL
ANION GAP: 12 (ref 5–15)
BUN: 22 mg/dL — ABNORMAL HIGH (ref 6–20)
CALCIUM: 9.2 mg/dL (ref 8.9–10.3)
CO2: 21 mmol/L — AB (ref 22–32)
CREATININE: 1.5 mg/dL — AB (ref 0.61–1.24)
Chloride: 104 mmol/L (ref 101–111)
GFR calc non Af Amer: 50 mL/min — ABNORMAL LOW (ref >60)
GFR, EST AFRICAN AMERICAN: 58 mL/min — AB (ref >60)
Glucose, Bld: 184 mg/dL — ABNORMAL HIGH (ref 65–99)
Potassium: 4 mmol/L (ref 3.5–5.1)
Sodium: 137 mmol/L (ref 135–145)

## 2017-03-14 LAB — LIPID PANEL
CHOLESTEROL: 151 mg/dL (ref 0–200)
HDL: 33 mg/dL — ABNORMAL LOW (ref >40)
LDL CALC: 96 mg/dL (ref 0–99)
TRIGLYCERIDES: 111 mg/dL (ref <150)
Total CHOL/HDL Ratio: 4.6 RATIO
VLDL: 22 mg/dL (ref 0–40)

## 2017-03-14 LAB — TROPONIN I: Troponin I: <0.03 ng/mL (ref <0.03)

## 2017-03-14 LAB — ECHOCARDIOGRAM COMPLETE
HEIGHTINCHES: 77 in
WEIGHTICAEL: 4324.54 [oz_av]

## 2017-03-14 MED ORDER — SODIUM CHLORIDE 0.9 % IV SOLN
INTRAVENOUS | Status: DC
  Administered 2017-03-14 – 2017-03-16 (×3): via INTRAVENOUS

## 2017-03-14 MED ORDER — INSULIN DETEMIR 100 UNIT/ML ~~LOC~~ SOLN
10.0000 [IU] | Freq: Every day | SUBCUTANEOUS | Status: DC
  Administered 2017-03-14 – 2017-03-15 (×2): 10 [IU] via SUBCUTANEOUS
  Filled 2017-03-14 (×3): qty 0.1

## 2017-03-14 MED ORDER — ATORVASTATIN CALCIUM 20 MG PO TABS
20.0000 mg | ORAL_TABLET | Freq: Every day | ORAL | Status: DC
  Administered 2017-03-15 – 2017-03-16 (×2): 20 mg via ORAL
  Filled 2017-03-14 (×2): qty 1

## 2017-03-14 MED ORDER — STROKE: EARLY STAGES OF RECOVERY BOOK
Freq: Once | Status: AC
  Administered 2017-03-14: 18:00:00
  Filled 2017-03-14: qty 1

## 2017-03-14 MED ORDER — INSULIN ASPART 100 UNIT/ML ~~LOC~~ SOLN
0.0000 [IU] | Freq: Three times a day (TID) | SUBCUTANEOUS | Status: DC
  Administered 2017-03-15: 7 [IU] via SUBCUTANEOUS
  Administered 2017-03-15 (×2): 5 [IU] via SUBCUTANEOUS
  Administered 2017-03-16: 10 [IU] via SUBCUTANEOUS
  Administered 2017-03-16: 3 [IU] via SUBCUTANEOUS
  Administered 2017-03-16: 10 [IU] via SUBCUTANEOUS

## 2017-03-14 MED ORDER — ATORVASTATIN CALCIUM 80 MG PO TABS: 80.0000 mg | ORAL_TABLET | Freq: Every day | ORAL | Status: DC

## 2017-03-14 NOTE — Progress Notes (Signed)
Pt arrived to unit, ambulated to bed. Identified appropriately and assessed. Vital signs stable, placed on telemetry. Oriented to room and unit, instructed to call when ambulating and call bell placed within reach. Bed alarm on. Will continue to monitor.

## 2017-03-14 NOTE — Progress Notes (Signed)
  Echocardiogram 2D Echocardiogram has been performed.  Dachelle Molzahn T Ah Bott 03/14/2017, 11:44 AM

## 2017-03-14 NOTE — Consult Note (Signed)
Referring Physician: Benito Mccreedy, MD  Chief Complaint: Left face arm numbness and tingling  HPI: [name] is an 57 y.o. male with a past medical history of diabetes, dyslipidemia, hypertension, PVD, Cervical Radiculopathy, CAD and questionable stroke with left facial droop presents to the ED for new onset left facial and left arm numbness and tingling.  Per patient he has a previous history of stroke in 2016 with baseline left facial droop.  He was at home sitting in his recliner when he had acute onset burning and tingling in his chest which radiated into the left side of his face neck and all the way down into his left hand to his fingertips.  He states at that time he passed out and woke up about an hour later with left sided numbness, tingling and weakness persistent.  He admits to a sore tongue and does not know if he bit his tongue,, denies loss of bladder and bowel function and confusion post waking up.  He called his pastor's wife, who assisted him and noted he had left-sided weakness and brought him to the ER for further evaluation.   On admission CT of the head and an MRI of the brain were normal.  He denies needed blurred vision, dysarthria chest pain, shortness of breath, dizziness, recent fever, chills or recent head trauma.  He admits to cervical radial colopathy with frequent daily headaches. Assessment patient is awake alert and oriented x3, in no distress.  Answers questions appropriately and moves all extremities equally with equal strength.  He admits to baseline neuropathy with no sensation from his knee down to his feet.   Past Medical History:  Diagnosis Date  . Allergic rhinitis, cause unspecified   . Cervicalgia   . Chest pain Sept. 2005   multiple caths  /  cath 06/15/2010..normal coronaries,  EF 60%   (false postive nuclear in the past)  . Dyslipidemia    mixed  . Esophageal reflux   . Fibromyalgia   . Gastroparesis   . Headache(784.0)   . History of  kidney stones   . Hyperlipidemia   . Hypertension   . IDDM (insulin dependent diabetes mellitus) ([place])   . Lumbago   . Neuropathy associated with endocrine disorder ([place])   . Overweight(278.02)   . Peripheral vascular disease ([place])   . Pulmonary embolus (HCC)    Hx of small left lower lobe  pulmonary embolus  . Pulmonary embolus ([place]) 2010ish  . Stroke [facility])    mini stroke aug 2016  . Type II or unspecified type diabetes mellitus with neurological manifestations, not stated as uncontrolled(250.60)   . Type II or unspecified type diabetes mellitus with neurological manifestations, not stated as uncontrolled(250.60)   . Urticaria, unspecified     Past Surgical History:  Procedure Laterality Date  . ACHILLES TENDON REPAIR Left 2013  . AMPUTATION Left 06/02/2013   Procedure: AMPUTATION 1ST TOE LEFT FOOT;  Surgeon: Marcheta Grammes, DPM;  Location: [facility];  Service: Orthopedics;  Laterality: Left;  . AMPUTATION Left 07/21/2013   Procedure: PARTIAL AMPUTATION 2ND TOE LEFT FOOT;  Surgeon: Marcheta Grammes, DPM;  Location: [facility];  Service: Podiatry;  Laterality: Left;  . ANTERIOR CERVICAL DECOMP/DISCECTOMY FUSION N/A 12/29/2016   Procedure: Cervical five-six Cervical six-seven Anterior cervical discectomy with fusion and plate fixation;  Surgeon: Ditty, Kevan Ny, MD;  Location: [place];  Service: Neurosurgery;  Laterality: N/A;  . APPENDECTOMY    . [place]  x3  . FOOT ARTHRODESIS Right 01/11/2014   Procedure: ARTHRODESIS INTERPHALANGEAL JOINT HALLUX RIGHT FOOT;  Surgeon: Marcheta Grammes, DPM;  Location: [facility];  Service: Podiatry;  Laterality: Right;  . HARDWARE REMOVAL Right 01/17/2015   Procedure: HARDWARE REMOVAL;  Surgeon: Caprice Beaver, DPM;  Location: [facility];  Service: Podiatry;  Laterality: Right;  . KNEE ARTHROSCOPY Right 10/2015  . LUMBAR LAMINECTOMY/DECOMPRESSION MICRODISCECTOMY Right 01/12/2013   Procedure: Right Lumbar Three-Four  Laminotomy/Foraminotomy;  Surgeon: Floyce Stakes, MD;  Location: [facility];  Service: Neurosurgery;  Laterality: Right;  Right Lumbar Three-Four Laminotomy/Foraminotomy  . NISSEN FUNDOPLICATION    . SHOULDER ARTHROSCOPY W/ ROTATOR CUFF REPAIR Right     Family History  Problem Relation Age of Onset  . Heart attack Father 70  . Hyperlipidemia Father   . Hypertension Father   . Heart attack Mother 26  . Diabetes Mother   . Hypertension Mother   . Hyperlipidemia Mother   . Healthy Brother   . Seizures Brother   . Migraines Neg Hx    Social History:  reports that he quit smoking about 34 years ago. His smoking use included cigarettes. He started smoking about 39 years ago. He has a 15.00 pack-year smoking history. he has never used smokeless tobacco. He reports that he does not drink alcohol or use drugs.  Allergies:  Allergies  Allergen Reactions  . Reglan [Metoclopramide] Itching and Rash    Medications:  Scheduled: .  stroke: mapping our early stages of recovery book   Does not apply Once  . aspirin EC  325 mg Oral Daily  . [START ON 03/15/2017] atorvastatin  80 mg Oral Daily  . gabapentin  2,400 mg Oral QHS  . ranolazine  1,000 mg Oral Daily    ROS: 10 point systems reviewed and are negative except for as above in history of present illness  Physical Examination: Blood pressure 128/76, pulse 61, temperature 98 F (36.7 C), temperature source Oral, resp. rate 18, height 6\' 5"  (1.956 m), weight 122.6 kg (270 lb 4.5 oz), SpO2 100 %. HEENT-  Normocephalic, no lesions, without obvious abnormality.  Normal external eye and conjunctiva.   Cardiovascular- S1-S2 audible, pulses palpable throughout   Lungs-no rhonchi or wheezing noted, no excessive working breathing.  Saturations within normal limits Abdomen- All 4 quadrants palpated and nontender Extremities- Warm, dry and intact-no sensation in bilateral lower extremities Musculoskeletal-no joint tenderness, deformity or  swelling Skin-warm and dry -left first and second toe  Neurological Examination Mental Status: Alert, oriented, thought content appropriate.  Speech fluent without evidence of aphasia.  Able to follow 3 step commands without difficulty.  Normal comprehension, repetition and recall Cranial Nerves: II: Discs flat bilaterally; Visual fields grossly normal,  III,IV, VI: ptosis not present, extra-ocular motions intact bilaterally pupils equal, round, reactive to light and accommodation V,VII: Mild left facial droop, mild asymmetric smile, ( per patient this is his baseline), decreased sensation to left side of his face VIII: Hearing intact to voice IX,X: uvula rises symmetrically XI: bilateral shoulder shrug XII: midline tongue extension Motor: Right : Upper extremity   5/5    Left:     Upper extremity   4/5  Lower extremity   5/5     Lower extremity   5/5 Tone and bulk:normal tone throughout; no atrophy noted Sensory: sensation  And pinprick and light touch decreased in left face,left upper extremity and left finger tips, and absent in bilateral lower extremities .  Patient baseline neuropathy  and fibromyalgia with no sensation in bilateral lower extremities . Deep Tendon Reflexes: Muted  plantars: Right: brachial +2, RLE absent   Left: brachial +2,  LLE absent Cerebellar: normal finger-to-nose, normal rapid alternating movements and normal heel-to-shin test Gait: normal gait and station  NIHSS 1a Level of Conscious.: 0 1b LOC Questions: 0 1c LOC Commands: 0 2 Best Gaze: 0 3 Visual: 0 4 Facial Palsy: 0 5a Motor Arm - left: 0 5b Motor Arm - Right: 0 6a Motor Leg - Left: 0 6b Motor Leg - Right: 0 7 Limb Ataxia: 0 8 Sensory:1 9 Best Language: 0 10 Dysarthria: 0 11 Extinct. and Inatten.: 0 TOTAL: 1  Mr Brain Wo Contrast (neuro Protocol) 03/13/2017 IMPRESSION:  Normal brain MRI.   Ct Head Code Stroke Wo Contrast 03/13/2017 IMPRESSION:  1. No acute hemorrhage or mass lesion.  2.  ASPECTS is 10.   Assessment: 57 y.o. male with a past medical history of diabetes, dyslipidemia, hypertension, PVD, Cervical Radiculopathy, CAD and questionable stroke with left facial droop presents to the ED for new onset left facial and left arm numbness and tingling.  Patient states he was sitting in his recliner, passed out and woke up about an hour later, with persistence of symptoms.  He denies loss of bladder or bowel function or confusion post waking up 1.Seizure.  differential diagnosis syncope vs stroke. Patient has a history of stroke with with  dysarthria which has resolved and persistent left facial droop.  He admits to history of fibromyalgia and severe neuropathy with no sensation in bilateral lower extremities and feet.  Patient complains of new onset  left upper extremity numbness and tingling which is improving at this time.  CT of the head and MRI of the brain was negative, echocardiogram for syncope workup was within normal limits with EF of 55-60%.  Although patient denies a history of seizures, we will workup seizure etiology and EEG is pending.  Pending EEG results we will start antiepileptic drugs.  Unlikely that patient had stroke, presentation likely secondary to seizure; loss of consciousness at rest 2.  Uncontrolled diabetes mellitus 3.  Hypertension 4.  Cervicalgia with multiple neck surgeries and frequent daily headaches 5.  Neuropathy/fibromyalgia   Plan: - EEG pending - Telemetry monitoring -  Frequent neuro checks - Continue Neurontin  Per [facility] statutes, patients with seizures are not allowed to drive until  they have been seizure-free for six months. Use caution when using heavy equipment or power tools. Avoid working on ladders or at heights. Take showers instead of baths. Ensure the water temperature is not too high on the home water heater. Do not go swimming alone. When caring for infants or small children, sit down when holding, feeding, or  changing them to minimize risk of injury to the child in the event you have a seizure.   Also, Maintain good sleep hygiene. Avoid alcohol.  --> Call 911 and bring the patient back to the ED if:                   A.  The seizure lasts longer than 5 minutes.                  B.  The patient doesn't awaken shortly after the seizure             C.  The patient has new problems such as difficulty seeing, speaking or moving  D.  The patient was injured during the seizure             E.  The patient has a temperature over 71 F (39C)             F.  The patient vomited and now is having trouble breathing   Jacob Moores DNP Neuro-hospitalist Team [phone] 03/14/2017, 5:16 PM

## 2017-03-14 NOTE — Progress Notes (Signed)
[facility]                                                                              Patient Demographics  [name], is a 57 y.o. male, DOB - August 28, 1960, ZYS:[id number]  Admit date - 03/13/2017   Admitting Physician Phillips Grout, MD  Outpatient Primary MD for the patient is Arsenio Katz, NP  Outpatient specialists:   LOS - 0  days    Chief Complaint  Patient presents with  . Numbness       Brief summary  [name] is a 57 y.o. male with medical history significant for but not limited to previous CVA left upper extremity burning/tingling followed by loss of consciousness, waking up about 1.5 hours later with left facial droop.  No account of concerns for associated neurologic symptoms concerning for seizures patient was subsequently transferred to [facility] for further evaluation of syncope to rule out seizures.     Assessment & Plan    Principal Problem:   Syncope Active Problems:   GERD   Hypertension   CVA (cerebral vascular accident) ([place])   H/O cervical spine surgery  #1 syncope: Serial cardiac enzymes negative No acute EKG changes Head CT negative MRI brain normal 2D echocardiogram pending EEG pending Neurology consulted  #2 IDDM: Glycemic control with sliding scale insulin/long-acting insulin Hold metformin for now  #3 acute on chronic kidney injury: Creatinine 1.42 on 01/02/2017 Admitting creatinine 1.65 Gentle hydration Monitor renal function with electrolytes  Code Status: Full code DVT Prophylaxis:  SCD's Family Communication: Discussed in detail with the patient, all imaging results, lab results explained to the patient    Disposition Plan: Home  Time Spent in minutes 35 minutes  Procedures:    Consultants:   Neurology  Antimicrobials:      Medications  Scheduled Meds: . aspirin EC  325 mg Oral Daily  . atorvastatin  20 mg Oral Daily  . gabapentin  2,400 mg Oral QHS  .  ranolazine  1,000 mg Oral Daily   Continuous Infusions: PRN Meds:.acetaminophen, gi cocktail, nitroGLYCERIN, ondansetron (ZOFRAN) IV   Antibiotics   Anti-infectives (From admission, onward)   None        Subjective:   [name] was seen and examined today.  No visual changes, no extremity weakness Objective:   Vitals:   03/13/17 2051 03/13/17 2100 03/13/17 2307 03/14/17 0534  BP:  (!) 144/86 (!) 134/92 135/84  Pulse: 67 (!) 55 (!) 53 60  Resp: 20 17 18 18   Temp:   (!) 97.5 F (36.4 C) (!) 97.5 F (36.4 C)  TempSrc:   Oral Oral  SpO2: 100% 100% 97% 98%  Weight:   122.6 kg (270 lb 4.5 oz)   Height:   6\' 5"  (1.956 m)    No intake or output data in the 24 hours ending 03/14/17 1259   Wt Readings from Last 3 Encounters:  03/13/17 122.6 kg (270 lb 4.5 oz)  12/29/16 121.6 kg (268 lb)  12/24/16 123.2 kg (271 lb 9.7 oz)     Exam  General: NAD  HEENT: NCAT,  PERRL,MMM  Neck:  SUPPLE, (-) JVD  Cardiovascular: RRR, (-) GALLOP, (-) MURMUR  Respiratory: CTA  Gastrointestinal: SOFT, (-) DISTENSION, BS(+), (_) TENDERNESS  Ext: (-) CYANOSIS, (-) EDEMA  Neuro: A, OX 3, left facial droop otherwise no acute focal deficit noted  Skin:(-) RASH  Psych:NORMAL AFFECT/MOOD   Data Reviewed:  I have personally reviewed following labs and imaging studies  Micro Results No results found for this or any previous visit (from the past 240 hour(s)).  Radiology Reports Dg Chest 2 View  Result Date: 03/13/2017 CLINICAL DATA:  Chest pain. EXAM: CHEST  2 VIEW COMPARISON:  July 14, 2016 FINDINGS: The heart size and mediastinal contours are within normal limits. Both lungs are clear. The visualized skeletal structures are unremarkable. IMPRESSION: No active cardiopulmonary disease. Electronically Signed   By: Dorise Bullion III M.D   On: 03/13/2017 19:41   Ct Soft Tissue Neck Wo Contrast  Result Date: 02/26/2017 CLINICAL DATA:  Dysphagia EXAM: CT NECK WITHOUT CONTRAST  TECHNIQUE: Multidetector CT imaging of the neck was performed following the standard protocol without intravenous contrast. COMPARISON:  CT neck 01/02/2017 FINDINGS: Pharynx and larynx: Normal. No mass or swelling. Salivary glands: No inflammation, mass, or stone. Thyroid: Negative Lymph nodes: No enlarged lymph nodes Vascular: Mild carotid artery calcification bilaterally. Venous contrast not given Limited intracranial: Negative Visualized orbits: Negative Mastoids and visualized paranasal sinuses: Negative Skeleton: ACDF C5 through C7. Hardware in good position. No acute skeletal abnormality. Dental caries and upper molars bilaterally. Upper chest: Negative Other: None IMPRESSION: No acute abnormality in the neck.  No mass or adenopathy ACDF C5-7 Dental caries Electronically Signed   By: Franchot [name] M.D.   On: 02/26/2017 14:49   Mr Brain Wo Contrast (neuro Protocol)  Result Date: 03/13/2017 CLINICAL DATA:  Seizure.  Left arm numbness and left facial droop. EXAM: MRI HEAD WITHOUT CONTRAST TECHNIQUE: Multiplanar, multiecho pulse sequences of the brain and surrounding structures were obtained without intravenous contrast. COMPARISON:  None. FINDINGS: Brain: The midline structures are normal. There is no acute infarct or acute hemorrhage. No mass lesion, hydrocephalus, dural abnormality or extra-axial collection. The brain parenchymal signal is normal. No age-advanced or lobar predominant atrophy. No chronic microhemorrhage or superficial siderosis. Vascular: Major intracranial arterial and venous sinus flow voids are preserved. Skull and upper cervical spine: The visualized skull base, calvarium, upper cervical spine and extracranial soft tissues are normal. Sinuses/Orbits: No fluid levels or advanced mucosal thickening. No mastoid or middle ear effusion. Normal orbits. IMPRESSION: Normal brain MRI. Electronically Signed   By: Ulyses Jarred M.D.   On: 03/13/2017 18:36   Ct Head Code Stroke Wo  Contrast  Result Date: 03/13/2017 CLINICAL DATA:  Code stroke. Left facial and arm numbness. Slurred speech. EXAM: CT HEAD WITHOUT CONTRAST TECHNIQUE: Contiguous axial images were obtained from the base of the skull through the vertex without intravenous contrast. COMPARISON:  None FINDINGS: Brain: No mass lesion or acute hemorrhage. No focal hypoattenuation of the basal ganglia or cortex to indicate infarcted tissue. No hydrocephalus or age advanced atrophy. Vascular: No hyperdense vessel. No advanced atherosclerotic calcification of the arteries at the skull base. Skull: Normal visualized skull base, calvarium and extracranial soft tissues. Sinuses/Orbits: No sinus fluid levels or advanced mucosal thickening. No mastoid effusion. Normal orbits. ASPECTS [facility] Stroke Program Early CT Score) - Ganglionic level infarction (caudate, lentiform nuclei, internal capsule, insula, M1-M3 cortex): 7 - Supraganglionic infarction (M4-M6 cortex): 3 Total score (0-10 with 10 being normal): 10 IMPRESSION: 1. No acute hemorrhage or mass  lesion. 2. ASPECTS is 10. These results were called by telephone at the time of interpretation on 03/13/2017 at 5:20 pm to Dr. Fredia Sorrow , who verbally acknowledged these results. Electronically Signed   By: Ulyses Jarred M.D.   On: 03/13/2017 17:22    Lab Data:  CBC: Recent Labs  Lab 03/13/17 1700 03/13/17 1714  WBC 7.0  --   NEUTROABS 3.2  --   HGB 12.8* 12.9*  HCT 39.0 38.0*  MCV 86.9  --   PLT 174  --    Basic Metabolic Panel: Recent Labs  Lab 03/13/17 1700 03/13/17 1714 03/14/17 0418  NA 136 138 137  K 4.6 5.3* 4.0  CL 105 106 104  CO2 20*  --  21*  GLUCOSE 275* 266* 184*  BUN 27* 33* 22*  CREATININE 1.65* 1.60* 1.50*  CALCIUM 9.6  --  9.2   GFR: Estimated Creatinine Clearance: 78.8 mL/min (A) (by C-G formula based on SCr of 1.5 mg/dL (H)). Liver Function Tests: Recent Labs  Lab 03/13/17 1700  AST 30  ALT 27  ALKPHOS 119  BILITOT 0.3  PROT 7.7   ALBUMIN 4.3   No results for input(s): LIPASE, AMYLASE in the last 168 hours. No results for input(s): AMMONIA in the last 168 hours. Coagulation Profile: Recent Labs  Lab 03/13/17 1700  INR 1.03   Cardiac Enzymes: Recent Labs  Lab 03/13/17 2110 03/14/17 0418 03/14/17 1005  TROPONINI <0.03 <0.03 <0.03   BNP (last 3 results) No results for input(s): PROBNP in the last 8760 hours. HbA1C: No results for input(s): HGBA1C in the last 72 hours. CBG: Recent Labs  Lab 03/13/17 1703 03/14/17 0126 03/14/17 0403  GLUCAP 256* 175* 156*   Lipid Profile: No results for input(s): CHOL, HDL, LDLCALC, TRIG, CHOLHDL, LDLDIRECT in the last 72 hours. Thyroid Function Tests: No results for input(s): TSH, T4TOTAL, FREET4, T3FREE, THYROIDAB in the last 72 hours. Anemia Panel: No results for input(s): VITAMINB12, FOLATE, FERRITIN, TIBC, IRON, RETICCTPCT in the last 72 hours. Urine analysis:    Component Value Date/Time   COLORURINE YELLOW 03/13/2017 [place] 03/13/2017 1746   LABSPEC 1.021 03/13/2017 1746   PHURINE 5.0 03/13/2017 1746   GLUCOSEU >=500 (A) 03/13/2017 1746   HGBUR NEGATIVE 03/13/2017 1746   BILIRUBINUR NEGATIVE 03/13/2017 1746   KETONESUR NEGATIVE 03/13/2017 1746   PROTEINUR NEGATIVE 03/13/2017 1746   UROBILINOGEN 0.2 08/30/2014 1443   NITRITE NEGATIVE 03/13/2017 1746   LEUKOCYTESUR NEGATIVE 03/13/2017 [place] M.D. [facility] 03/14/2017, 12:59 PM  Pager: [phone] Between 7am to 7pm - call Pager - [phone]  After 7pm go to [email or web address] - password [email or web address]  Call night coverage person covering after 7pm

## 2017-03-14 NOTE — Progress Notes (Signed)
Pt transferred from [facility] due to neurologic symptoms concerning for seizure.  Went to see pt.  He is sleeping as I entered room.  Denies cp, sob, HA, new or worsen numbness or tingling.  VSS -no need presently.   -am BMP and troponin ordered  DTat

## 2017-03-15 ENCOUNTER — Inpatient Hospital Stay (HOSPITAL_COMMUNITY): Payer: Medicare HMO

## 2017-03-15 LAB — GLUCOSE, CAPILLARY
GLUCOSE-CAPILLARY: 251 mg/dL — AB (ref 65–99)
Glucose-Capillary: 343 mg/dL — ABNORMAL HIGH (ref 65–99)
Glucose-Capillary: 285 mg/dL — ABNORMAL HIGH (ref 65–99)
Glucose-Capillary: 269 mg/dL — ABNORMAL HIGH (ref 65–99)

## 2017-03-15 LAB — BASIC METABOLIC PANEL
Anion gap: 10 (ref 5–15)
BUN: 23 mg/dL — ABNORMAL HIGH (ref 6–20)
CALCIUM: 9.2 mg/dL (ref 8.9–10.3)
CO2: 22 mmol/L (ref 22–32)
CREATININE: 1.65 mg/dL — AB (ref 0.61–1.24)
Chloride: 103 mmol/L (ref 101–111)
GFR calc Af Amer: 52 mL/min — ABNORMAL LOW (ref >60)
GFR, EST NON AFRICAN AMERICAN: 45 mL/min — AB (ref >60)
Glucose, Bld: 268 mg/dL — ABNORMAL HIGH (ref 65–99)
POTASSIUM: 4.5 mmol/L (ref 3.5–5.1)
SODIUM: 135 mmol/L (ref 135–145)

## 2017-03-15 LAB — HEMOGLOBIN A1C
Hgb A1c MFr Bld: 8.4 % — ABNORMAL HIGH (ref 4.8–5.6)
MEAN PLASMA GLUCOSE: 194.38 mg/dL

## 2017-03-15 NOTE — Progress Notes (Signed)
Subjective: Pt in chair, has no complaints this am, Feel stronger than yesterday. Denies headaches, dizziness, blurred vision or worsening paresthesia.   Exam: Vitals:   03/14/17 2143 03/15/17 0619  BP: 128/87 134/72  Pulse: (!) 52 61  Resp: 18 18  Temp: 97.7 F (36.5 C) 97.6 F (36.4 C)  SpO2: 100% 98%    Physical Exam   HEENT-  Normocephalic, no lesions, without obvious abnormality.  Normal external eye and conjunctiva.  Cardiovascular- S1-S2 audible, pulses palpable throughout   Lungs-no rhonchi or wheezing noted, no excessive working breathing.  Saturations within normal limits Abdomen- All 4 quadrants palpated and nontender Extremities- Warm, dry and intact Musculoskeletal-no joint tenderness, deformity or swelling Skin-warm and dry, no hyperpigmentation, vitiligo, or suspicious lesions  Neuro:  Mental Status: Alert, oriented, thought content appropriate.  Speech fluent without evidence of aphasia.  Able to follow 3 step commands without difficulty.  Normal comprehension, repetition and recall Cranial Nerves: II: Discs flat bilaterally; Visual fields grossly normal,  III,IV, VI: ptosis not present, extra-ocular motions intact bilaterally pupils equal, round, reactive to light and accommodation V,VII: Mild left facial droop, mild asymmetric smile, (per patient this is his baseline), decreased sensation to left side of his face VIII: Hearing intact to voice IX,X: uvula rises symmetrically XI: bilateral shoulder shrug XII: midline tongue extension Motor: Right :  Upper extremity   5/5                                      Left:     Upper extremity   4/5             Lower extremity   5/5                                                  Lower extremity   5/5 Tone and bulk:normal tone throughout; no atrophy noted Sensory: sensation  and pinprick and light touch decreased in left face,left upper extremity and left finger tips, and absent in bilateral lower extremities .  Patient  baseline neuropathy and fibromyalgia with no sensation in bilateral lower extremities . Deep Tendon Reflexes: Muted  plantars: Right: brachial +2, RLE absent                                  Left: brachial +2,  LLE absent Cerebellar: normal finger-to-nose, normal rapid alternating movements and normal heel-to-shin test Gait: normal gait and station    Medications:  Current Facility-Administered Medications:  .  0.9 %  sodium chloride infusion, , Intravenous, Continuous, Osei-Bonsu, George, MD, Last Rate: 75 mL/hr at 03/15/17 8250 .  acetaminophen (TYLENOL) tablet 650 mg, 650 mg, Oral, Q4H PRN, Phillips Grout, MD .  aspirin EC tablet 325 mg, 325 mg, Oral, Daily, Derrill Kay A, MD, 325 mg at 03/15/17 0855 .  atorvastatin (LIPITOR) tablet 20 mg, 20 mg, Oral, Daily, [place], Schneider Warchol, NP, 20 mg at 03/15/17 0855 .  gabapentin (NEURONTIN) tablet 2,400 mg, 2,400 mg, Oral, QHS, Derrill Kay A, MD, 2,400 mg at 03/14/17 2214 .  gi cocktail (Maalox,Lidocaine,Donnatal), 30 mL, Oral, QID PRN, Derrill Kay A, MD .  insulin aspart (novoLOG) injection 0-9 Units, 0-9 Units, Subcutaneous, TID  WC, Osei-Bonsu, Iona Beard, MD, 5 Units at 03/15/17 [phone] .  insulin detemir (LEVEMIR) injection 10 Units, 10 Units, Subcutaneous, QHS, Osei-Bonsu, George, MD, 10 Units at 03/14/17 2214 .  nitroGLYCERIN (NITROSTAT) SL tablet 0.4 mg, 0.4 mg, Sublingual, Q5 min PRN, Derrill Kay A, MD .  ondansetron (ZOFRAN) injection 4 mg, 4 mg, Intravenous, Q6H PRN, Phillips Grout, MD .  ranolazine (RANEXA) 12 hr tablet 1,000 mg, 1,000 mg, Oral, Daily, Phillips Grout, MD, 1,000 mg at 03/15/17 9604  Pertinent Labs/Diagnostics:  Assessment: 57 y.o. male with a past medical history of diabetes, dyslipidemia, hypertension, PVD, Cervical Radiculopathy, CAD presents to the ED for new onset left facial and left arm paresthesia.  Patient states he was sitting in his recliner, passed out and woke up about an hour later, with persistence of  symptoms.  He denies loss of bladder or bowel function or confusion post waking up  1.Seizure vs Syncope.   MRI did not reveal any evidence of encephalomalacia or signs of an old stroke as patient states he had.   He also has no evidence of a potential seizure focus on his MRI.  Today he remembered that last year he had trauma to his head twice where 2 x 4 point get his head and he also had a fall where he hit his head on the pews at church and on the ground with brief loss of consciousness.    Patient denies history of seizures, but we will rule out seizure with  EEG. Pending EEG results we will start antiepileptic drugs.    Loss of consciousness while at rest in a seated position is very concerning and will warrant driving prohibition for 6 months if symptoms do not recur. 2.  Uncontrolled diabetes mellitus 3.  Hypertension 4.  Cervicalgia with multiple neck surgeries and frequent daily headaches 5.  Neuropathy/fibromyalgia  Recommendations: - EEG pending - Telemetry monitoring - Frequent neuro checks - Continue Neurontin for neuropathy - DO NOT DRIVE FOR THE NEXT 6 MONTHS  Per [facility] statutes, patients with seizures are not allowed to drive until  they have been seizure-free for six months. Use caution when using heavy equipment or power tools. Avoid working on ladders or at heights. Take showers instead of baths. Ensure the water temperature is not too high on the home water heater. Do not go swimming alone. When caring for infants or small children, sit down when holding, feeding, or changing them to minimize risk of injury to the child in the event you have a seizure.   Also, Maintain good sleep hygiene. Avoid alcohol.  -->Call 911 and bring the patient back to the ED if:  A. The seizure lasts longer than 5 minutes.  B. The patient doesn't awaken shortly after the seizure C. The patient has new problems such as difficulty seeing,  speaking or moving D. The patient was injured during the seizure E. The patient has a temperature over 102 F (39C) F. The patient vomited and now is having trouble breathing     Jacob Moores DNP Neuro-hospitalist Team [phone] 03/15/2017, 9:20 AM

## 2017-03-15 NOTE — Procedures (Signed)
History: 57 year old male with transient episode of loss of consciousness  Sedation: None  Technique: This is a 21 channel routine scalp EEG performed at the bedside with bipolar and monopolar montages arranged in accordance to the international 10/20 system of electrode placement. One channel was dedicated to EKG recording.    Background: The background consists of intermixed alpha and beta activities. There is a well defined posterior dominant rhythm of 8  Hz that attenuates with eye opening.  There is an increase in delta associated with drowsiness.  Sleep is recorded with normal appearing structures.   Photic stimulation: Physiologic driving is present  EEG Abnormalities: None  Clinical Interpretation: This normal EEG is recorded in the waking and sleep state. There was no seizure or seizure predisposition recorded on this study. Please note that a normal EEG does not preclude the possibility of epilepsy.   Roland Rack, MD [facility] [phone]  If 7pm- 7am, please page neurology on call as listed in [place].

## 2017-03-15 NOTE — Progress Notes (Signed)
Physical Therapy Evaluation Patient Details Name: [name] MRN: [id number] DOB: Apr 30, 1960 Today's Date: 03/15/2017   History of Present Illness  Pt presentsto the hospital with a potential seizure. He has had a CVA in the past.  He has had multiple back and knee surgeries in past.  HTN, DM, PE and CVA in PMH.    Clinical Impression  Patient had no syncope or loss of balance with PT. He showed no neurological deficits with mobility. He has no need for further skilled acute care PT. D/C to HEP     Follow Up Recommendations No PT follow up    Equipment Recommendations       Recommendations for Other Services       Precautions / Restrictions Precautions Precautions: None Restrictions Weight Bearing Restrictions: No      Mobility  Bed Mobility Overal bed mobility: Independent             General bed mobility comments: Patient found in a chair. he reports he was able to get up out of the bed without difficulty   Transfers Overall transfer level: Independent               General transfer comment: Stood from chair without assistance.   Ambulation/Gait Ambulation/Gait assistance: Independent Ambulation Distance (Feet): 200 Feet Assistive device: None Gait Pattern/deviations: WFL(Within Functional Limits)   Gait velocity interpretation: at or above normal speed for age/gender General Gait Details: Patient abel to ambualte while moving his head side to side and up and down. Patient also able to change speeds without LOB   Stairs            Wheelchair Mobility    Modified Rankin (Stroke Patients Only)       Balance Overall balance assessment: Independent                                           Pertinent Vitals/Pain Pain Assessment: No/denies pain    Home Living Family/patient expects to be discharged to:: Private residence Living Arrangements: Spouse/significant other Available Help at Discharge: Family;Available  PRN/intermittently Type of Home: House Home Access: Stairs to enter Entrance Stairs-Rails: Right Entrance Stairs-Number of Steps: 2 Home Layout: One level        Prior Function Level of Independence: Independent               Hand Dominance   Dominant Hand: Right    Extremity/Trunk Assessment   Upper Extremity Assessment Upper Extremity Assessment: Defer to OT evaluation    Lower Extremity Assessment Lower Extremity Assessment: Overall WFL for tasks assessed       Communication   Communication: No difficulties  Cognition Arousal/Alertness: Awake/alert Behavior During Therapy: WFL for tasks assessed/performed Overall Cognitive Status: Within Functional Limits for tasks assessed                                        General Comments General comments (skin integrity, edema, etc.): eye tracking not smooth; Finger to nose test normal;     Exercises     Assessment/Plan    PT Assessment Patent does not need any further PT services  PT Problem List         PT Treatment Interventions      PT Goals (Current goals can  be found in the Care Plan section)  Acute Rehab PT Goals Patient Stated Goal: to go home  PT Goal Formulation: With patient Time For Goal Achievement: 03/22/17 Potential to Achieve Goals: Good    Frequency     Barriers to discharge        Co-evaluation               AM-PAC PT "6 Clicks" Daily Activity  Outcome Measure Difficulty turning over in bed (including adjusting bedclothes, sheets and blankets)?: None Difficulty moving from lying on back to sitting on the side of the bed? : None Difficulty sitting down on and standing up from a chair with arms (e.g., wheelchair, bedside commode, etc,.)?: None Help needed moving to and from a bed to chair (including a wheelchair)?: None Help needed walking in hospital room?: None Help needed climbing 3-5 steps with a railing? : None 6 Click Score: 24    End of Session  Equipment Utilized During Treatment: Gait belt Activity Tolerance: Patient tolerated treatment well Patient left: in chair;with call bell/phone within reach Nurse Communication: Mobility status PT Visit Diagnosis: Other abnormalities of gait and mobility (R26.89)    Time: 1120-1130 PT Time Calculation (min) (ACUTE ONLY): 10 min   Charges:   PT Evaluation $PT Eval Low Complexity: 1 Low     PT G Codes:        Carney Living PT DPT 03/15/2017, 12:49 PM

## 2017-03-15 NOTE — Progress Notes (Signed)
[facility]                                                                              Patient Demographics  [name], is a 57 y.o. male, DOB - [birth year], SNK:[id number]  Admit date - 03/13/2017   Admitting Physician Phillips Grout, MD  Outpatient Primary MD for the patient is Arsenio Katz, NP  Outpatient specialists:   LOS - 1  days    Chief Complaint  Patient presents with  . Numbness       Brief summary  [name] is a 57 y.o. male with medical history significant for but not limited to previous CVA left upper extremity burning/tingling followed by loss of consciousness, waking up about 1.5 hours later with left facial droop.  No account of concerns for associated neurologic symptoms concerning for seizures patient was subsequently transferred to [facility] for further evaluation of syncope to rule out seizures.     Assessment & Plan    Principal Problem:   Syncope Active Problems:   GERD   Hypertension   CVA (cerebral vascular accident) ([place])   H/O cervical spine surgery  #1 syncope: Serial cardiac enzymes negative No acute EKG changes Head CT negative MRI brain normal 2D echocardiogram -EF 55-60%, left inferior basilar hypokinesis, grade 1 diastolic dysfunction EEG pending Neurology consulting Consider outpatient stress test  #2 IDDM: Glycemic control with sliding scale insulin/long-acting insulin Hold metformin till Cr < 1.5  #3 acute on chronic kidney injury: Creatinine 1.42 on 01/02/2017 Admitting creatinine 1.65 Gentle hydration Monitor renal function with electrolytes  Code Status: Full code DVT Prophylaxis:  SCD's Family Communication: Discussed in detail with the patient, all imaging results, lab results explained to the patient    Disposition Plan: Home  Time Spent in minutes 25 minutes  Procedures:    Consultants:   Neurology  Antimicrobials:      Medications  Scheduled Meds: .  aspirin EC  325 mg Oral Daily  . atorvastatin  20 mg Oral Daily  . gabapentin  2,400 mg Oral QHS  . insulin aspart  0-9 Units Subcutaneous TID WC  . insulin detemir  10 Units Subcutaneous QHS  . ranolazine  1,000 mg Oral Daily   Continuous Infusions: . sodium chloride 75 mL/hr at 03/15/17 0822   PRN Meds:.acetaminophen, gi cocktail, nitroGLYCERIN, ondansetron (ZOFRAN) IV   Antibiotics   Anti-infectives (From admission, onward)   None        Subjective:   [name] was seen and examined today.  No visual changes, no extremity weakness Objective:   Vitals:   03/14/17 0534 03/14/17 1535 03/14/17 2143 03/15/17 0619  BP: 135/84 128/76 128/87 134/72  Pulse: 60 61 (!) 52 61  Resp: 18 18 18 18   Temp: (!) 97.5 F (36.4 C) 98 F (36.7 C) 97.7 F (36.5 C) 97.6 F (36.4 C)  TempSrc: Oral Oral Oral Oral  SpO2: 98% 100% 100% 98%  Weight:      Height:        Intake/Output Summary (Last 24 hours) at 03/15/2017 1139 Last data filed at 03/15/2017 0555 Gross per 24 hour  Intake 903.75 ml  Output -  Net 903.75 ml     Wt Readings from Last 3 Encounters:  03/13/17 122.6 kg (270 lb 4.5 oz)  12/29/16 121.6 kg (268 lb)  12/24/16 123.2 kg (271 lb 9.7 oz)     Exam  General: NAD  HEENT: NCAT,  PERRL,MMM  Neck: SUPPLE, (-) JVD  Cardiovascular: RRR, (-) GALLOP, (-) MURMUR  Respiratory: CTA  Gastrointestinal: SOFT, (-) DISTENSION, BS(+), (_) TENDERNESS  Ext: (-) CYANOSIS, (-) EDEMA  Neuro: A, OX 3, left facial droop otherwise no acute focal deficit noted  Skin:(-) RASH  Psych:NORMAL AFFECT/MOOD   Data Reviewed:  I have personally reviewed following labs and imaging studies  Micro Results No results found for this or any previous visit (from the past 240 hour(s)).  Radiology Reports Dg Chest 2 View  Result Date: 03/13/2017 CLINICAL DATA:  Chest pain. EXAM: CHEST  2 VIEW COMPARISON:  July 14, 2016 FINDINGS: The heart size and mediastinal contours are within  normal limits. Both lungs are clear. The visualized skeletal structures are unremarkable. IMPRESSION: No active cardiopulmonary disease. Electronically Signed   By: Dorise Bullion III M.D   On: 03/13/2017 19:41   Ct Soft Tissue Neck Wo Contrast  Result Date: 02/26/2017 CLINICAL DATA:  Dysphagia EXAM: CT NECK WITHOUT CONTRAST TECHNIQUE: Multidetector CT imaging of the neck was performed following the standard protocol without intravenous contrast. COMPARISON:  CT neck 01/02/2017 FINDINGS: Pharynx and larynx: Normal. No mass or swelling. Salivary glands: No inflammation, mass, or stone. Thyroid: Negative Lymph nodes: No enlarged lymph nodes Vascular: Mild carotid artery calcification bilaterally. Venous contrast not given Limited intracranial: Negative Visualized orbits: Negative Mastoids and visualized paranasal sinuses: Negative Skeleton: ACDF C5 through C7. Hardware in good position. No acute skeletal abnormality. Dental caries and upper molars bilaterally. Upper chest: Negative Other: None IMPRESSION: No acute abnormality in the neck.  No mass or adenopathy ACDF C5-7 Dental caries Electronically Signed   By: Franchot [name] M.D.   On: 02/26/2017 14:49   Mr Brain Wo Contrast (neuro Protocol)  Result Date: 03/13/2017 CLINICAL DATA:  Seizure.  Left arm numbness and left facial droop. EXAM: MRI HEAD WITHOUT CONTRAST TECHNIQUE: Multiplanar, multiecho pulse sequences of the brain and surrounding structures were obtained without intravenous contrast. COMPARISON:  None. FINDINGS: Brain: The midline structures are normal. There is no acute infarct or acute hemorrhage. No mass lesion, hydrocephalus, dural abnormality or extra-axial collection. The brain parenchymal signal is normal. No age-advanced or lobar predominant atrophy. No chronic microhemorrhage or superficial siderosis. Vascular: Major intracranial arterial and venous sinus flow voids are preserved. Skull and upper cervical spine: The visualized skull base,  calvarium, upper cervical spine and extracranial soft tissues are normal. Sinuses/Orbits: No fluid levels or advanced mucosal thickening. No mastoid or middle ear effusion. Normal orbits. IMPRESSION: Normal brain MRI. Electronically Signed   By: Ulyses Jarred M.D.   On: 03/13/2017 18:36   Ct Head Code Stroke Wo Contrast  Result Date: 03/13/2017 CLINICAL DATA:  Code stroke. Left facial and arm numbness. Slurred speech. EXAM: CT HEAD WITHOUT CONTRAST TECHNIQUE: Contiguous axial images were obtained from the base of the skull through the vertex without intravenous contrast. COMPARISON:  None FINDINGS: Brain: No mass lesion or acute hemorrhage. No focal hypoattenuation of the basal ganglia or cortex to indicate infarcted tissue. No hydrocephalus or age advanced atrophy. Vascular: No hyperdense vessel. No advanced atherosclerotic calcification of the arteries at the skull base. Skull: Normal visualized skull base, calvarium and extracranial  soft tissues. Sinuses/Orbits: No sinus fluid levels or advanced mucosal thickening. No mastoid effusion. Normal orbits. ASPECTS [facility] Stroke Program Early CT Score) - Ganglionic level infarction (caudate, lentiform nuclei, internal capsule, insula, M1-M3 cortex): 7 - Supraganglionic infarction (M4-M6 cortex): 3 Total score (0-10 with 10 being normal): 10 IMPRESSION: 1. No acute hemorrhage or mass lesion. 2. ASPECTS is 10. These results were called by telephone at the time of interpretation on 03/13/2017 at 5:20 pm to Dr. Fredia Sorrow , who verbally acknowledged these results. Electronically Signed   By: Ulyses Jarred M.D.   On: 03/13/2017 17:22    Lab Data:  CBC: Recent Labs  Lab 03/13/17 1700 03/13/17 1714  WBC 7.0  --   NEUTROABS 3.2  --   HGB 12.8* 12.9*  HCT 39.0 38.0*  MCV 86.9  --   PLT 174  --    Basic Metabolic Panel: Recent Labs  Lab 03/13/17 1700 03/13/17 1714 03/14/17 0418 03/15/17 0307  NA 136 138 137 135  K 4.6 5.3* 4.0 4.5  CL 105 106 104  103  CO2 20*  --  21* 22  GLUCOSE 275* 266* 184* 268*  BUN 27* 33* 22* 23*  CREATININE 1.65* 1.60* 1.50* 1.65*  CALCIUM 9.6  --  9.2 9.2   GFR: Estimated Creatinine Clearance: 71.6 mL/min (A) (by C-G formula based on SCr of 1.65 mg/dL (H)). Liver Function Tests: Recent Labs  Lab 03/13/17 1700  AST 30  ALT 27  ALKPHOS 119  BILITOT 0.3  PROT 7.7  ALBUMIN 4.3   No results for input(s): LIPASE, AMYLASE in the last 168 hours. No results for input(s): AMMONIA in the last 168 hours. Coagulation Profile: Recent Labs  Lab 03/13/17 1700  INR 1.03   Cardiac Enzymes: Recent Labs  Lab 03/13/17 2110 03/14/17 0418 03/14/17 1005  TROPONINI <0.03 <0.03 <0.03   BNP (last 3 results) No results for input(s): PROBNP in the last 8760 hours. HbA1C: Recent Labs    03/15/17 0307  HGBA1C 8.4*   CBG: Recent Labs  Lab 03/13/17 1703 03/14/17 0126 03/14/17 0403 03/14/17 2222 03/15/17 0740  GLUCAP 256* 175* 156* 286* 251*   Lipid Profile: Recent Labs    03/14/17 1658  CHOL 151  HDL 33*  LDLCALC 96  TRIG 111  CHOLHDL 4.6   Thyroid Function Tests: No results for input(s): TSH, T4TOTAL, FREET4, T3FREE, THYROIDAB in the last 72 hours. Anemia Panel: No results for input(s): VITAMINB12, FOLATE, FERRITIN, TIBC, IRON, RETICCTPCT in the last 72 hours. Urine analysis:    Component Value Date/Time   COLORURINE YELLOW 03/13/2017 [place] 03/13/2017 1746   LABSPEC 1.021 03/13/2017 1746   PHURINE 5.0 03/13/2017 1746   GLUCOSEU >=500 (A) 03/13/2017 1746   HGBUR NEGATIVE 03/13/2017 1746   BILIRUBINUR NEGATIVE 03/13/2017 1746   KETONESUR NEGATIVE 03/13/2017 1746   PROTEINUR NEGATIVE 03/13/2017 1746   UROBILINOGEN 0.2 08/30/2014 1443   NITRITE NEGATIVE 03/13/2017 1746   LEUKOCYTESUR NEGATIVE 03/13/2017 [place] M.D. [facility] 03/15/2017, 11:39 AM  Pager: [phone] Between 7am to 7pm - call Pager - [phone]  After 7pm go to  [email or web address] - password [email or web address]  Call night coverage person covering after 7pm

## 2017-03-15 NOTE — Progress Notes (Signed)
SLP Cancellation Note  Patient Details Name: [name] MRN: [id number] DOB: Apr 13, 1960   Cancelled treatment:       Reason Eval/Treat Not Completed: SLP screened, no needs identified, will sign off  MRI is negative for acute findings and patient interacting appropriately and able to state why he's in the hospital.    Shelly Flatten, MA, [place] [phone]'  Lamar Sprinkles 03/15/2017, 3:33 PM

## 2017-03-15 NOTE — Progress Notes (Signed)
EEG complete - results pending 

## 2017-03-15 NOTE — Evaluation (Signed)
Occupational Therapy Evaluation Patient Details Name: [name] MRN: [id number] DOB: 03-05-1960 Today's Date: 03/15/2017    History of Present Illness Pt presentsto the hospital with a potential seizure. He has had a CVA in the past.  He has had multiple back and knee surgeries in past.  HTN, DM, PE and CVA in PMH.     Clinical Impression   Pt admitted with the above diagnoses and presents with below problem list. PTA pt was independent with ADLs. Pt is currently mod I to independent with ADLs. Does present still with some mild weakness and impaired sensation in LUE, educated on safety and management with ADLs. 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     Follow Up Recommendations  No OT follow up    Equipment Recommendations  None recommended by OT    Recommendations for Other Services       Precautions / Restrictions Precautions Precautions: None Restrictions Weight Bearing Restrictions: No      Mobility Bed Mobility Overal bed mobility: Independent             General bed mobility comments: Patient found in a chair. he reports he was able to get up out of the bed without difficulty   Transfers Overall transfer level: Independent               General transfer comment: Stood from chair without assistance.     Balance Overall balance assessment: Independent                                         ADL either performed or assessed with clinical judgement   ADL Overall ADL's : Modified independent                                       General ADL Comments: Pt completed simulated tub transfer and functional mobility within the room. Discussed strategies and safety with LUE sensation deficits.      Vision         Perception     Praxis      Pertinent Vitals/Pain Pain Assessment: No/denies pain      Hand Dominance Right   Extremity/Trunk Assessment Upper Extremity Assessment Upper Extremity Assessment: LUE deficits/detail LUE Deficits / Details: 4/5 strength, decreased sensation throughout LUE.  LUE Sensation: decreased light touch   Lower Extremity Assessment Lower Extremity Assessment: Defer to PT evaluation   Cervical / Trunk Assessment Cervical / Trunk Assessment: Normal   Communication Communication Communication: No difficulties   Cognition Arousal/Alertness: Awake/alert Behavior During Therapy: WFL for tasks assessed/performed Overall Cognitive Status: Within Functional Limits for tasks assessed                                     General Comments  eye tracking not smooth; Finger to nose test normal;     Exercises     Shoulder Instructions      Home Living Family/patient expects to be discharged to:: Private residence Living Arrangements: Spouse/significant other Available Help at Discharge: Family;Available PRN/intermittently Type of Home: House Home Access: Stairs to enter [employer] of  Steps: 2 Entrance Stairs-Rails: Right Home Layout: One level     Bathroom Shower/Tub: Teacher, early years/pre: Standard                Prior Functioning/Environment Level of Independence: Independent                 OT Problem List: Impaired sensation;Impaired UE functional use      OT Treatment/Interventions: Self-care/ADL training;Therapeutic exercise;Therapeutic activities;Patient/family education    OT Goals(Current goals can be found in the care plan section) Acute Rehab OT Goals Patient Stated Goal: to go home  OT Goal Formulation: With patient Time For Goal Achievement: 03/22/17 Potential to Achieve Goals: Good ADL Goals Additional ADL Goal #1: Pt will be independent with strategies for managing impaired sensation in LUE.  OT Frequency: Min 1X/week   Barriers to D/C:             Co-evaluation              AM-PAC PT "6 Clicks" Daily Activity     Outcome Measure Help from another person eating meals?: None Help from another person taking care of personal grooming?: None Help from another person toileting, which includes using toliet, bedpan, or urinal?: None Help from another person bathing (including washing, rinsing, drying)?: None Help from another person to put on and taking off regular upper body clothing?: None Help from another person to put on and taking off regular lower body clothing?: None 6 Click Score: 24   End of Session    Activity Tolerance: Patient tolerated treatment well Patient left: in chair;with call bell/phone within reach  OT Visit Diagnosis: Muscle weakness (generalized) (M62.81);Other (comment)(impaired sensation LUE)                Time: 3762-8315 OT Time Calculation (min): 15 min Charges:  OT General Charges $OT Visit: 1 Visit OT Evaluation $OT Eval Low Complexity: 1 Low G-Codes:       Hortencia Pilar 03/15/2017, 1:53 PM

## 2017-03-16 ENCOUNTER — Other Ambulatory Visit: Payer: Self-pay | Admitting: Cardiology

## 2017-03-16 DIAGNOSIS — R200 Anesthesia of skin: Secondary | ICD-10-CM

## 2017-03-16 DIAGNOSIS — I10 Essential (primary) hypertension: Secondary | ICD-10-CM

## 2017-03-16 DIAGNOSIS — R072 Precordial pain: Secondary | ICD-10-CM

## 2017-03-16 DIAGNOSIS — R55 Syncope and collapse: Secondary | ICD-10-CM

## 2017-03-16 DIAGNOSIS — Z9889 Other specified postprocedural states: Secondary | ICD-10-CM

## 2017-03-16 DIAGNOSIS — K219 Gastro-esophageal reflux disease without esophagitis: Secondary | ICD-10-CM

## 2017-03-16 LAB — BASIC METABOLIC PANEL
Anion gap: 11 (ref 5–15)
BUN: 24 mg/dL — AB (ref 6–20)
CALCIUM: 8.6 mg/dL — AB (ref 8.9–10.3)
CHLORIDE: 106 mmol/L (ref 101–111)
CO2: 20 mmol/L — ABNORMAL LOW (ref 22–32)
CREATININE: 1.66 mg/dL — AB (ref 0.61–1.24)
GFR calc Af Amer: 51 mL/min — ABNORMAL LOW (ref >60)
GFR calc non Af Amer: 44 mL/min — ABNORMAL LOW (ref >60)
Glucose, Bld: 242 mg/dL — ABNORMAL HIGH (ref 65–99)
Potassium: 4.3 mmol/L (ref 3.5–5.1)
SODIUM: 137 mmol/L (ref 135–145)

## 2017-03-16 LAB — GLUCOSE, CAPILLARY
GLUCOSE-CAPILLARY: 237 mg/dL — AB (ref 65–99)
GLUCOSE-CAPILLARY: 437 mg/dL — AB (ref 65–99)
GLUCOSE-CAPILLARY: 440 mg/dL — AB (ref 65–99)
Glucose-Capillary: 461 mg/dL — ABNORMAL HIGH (ref 65–99)

## 2017-03-16 MED ORDER — ATORVASTATIN CALCIUM 40 MG PO TABS: 40.0000 mg | ORAL_TABLET | Freq: Every day | ORAL | Status: DC

## 2017-03-16 MED ORDER — INSULIN DETEMIR 100 UNIT/ML ~~LOC~~ SOLN
25.0000 [IU] | Freq: Every day | SUBCUTANEOUS | Status: DC
  Filled 2017-03-16: qty 0.25

## 2017-03-16 MED ORDER — ATORVASTATIN CALCIUM 40 MG PO TABS: 40.0000 mg | ORAL_TABLET | Freq: Every day | ORAL | 0 refills | Status: DC

## 2017-03-16 MED ORDER — INSULIN GLARGINE 100 UNIT/ML ~~LOC~~ SOLN
20.0000 [IU] | Freq: Once | SUBCUTANEOUS | Status: DC
  Filled 2017-03-16: qty 0.2

## 2017-03-16 MED ORDER — INSULIN ASPART 100 UNIT/ML ~~LOC~~ SOLN: 4.0000 [IU] | Freq: Three times a day (TID) | SUBCUTANEOUS | Status: DC

## 2017-03-16 MED ORDER — ASPIRIN EC 81 MG PO TBEC: 81.0000 mg | DELAYED_RELEASE_TABLET | Freq: Every day | ORAL | Status: DC

## 2017-03-16 NOTE — Progress Notes (Signed)
[place] to be D/C'd Home per MD order.  Discussed with the patient and all questions fully answered.  VSS, Skin clean, dry and intact without evidence of skin break down, no evidence of skin tears noted. IV catheter discontinued intact. Site without signs and symptoms of complications. Dressing and pressure applied.  An After Visit Summary was printed and given to the patient. Patient received prescription.  D/c education completed with patient/family including follow up instructions, medication list, d/c activities limitations if indicated, with other d/c instructions as indicated by MD - patient able to verbalize understanding, all questions fully answered.   Patient instructed to return to ED, call 911, or call MD for any changes in condition.   Patient escorted via [place], and D/C home via private auto.  Richardean Chimera 03/16/2017 2:40 PM

## 2017-03-16 NOTE — Consult Note (Signed)
Cardiology Consultation:   Patient ID: [name]; [id number]; 08/16/1960   Admit date: 03/13/2017 Date of Consult: 03/16/2017  Primary Care Provider: Arsenio Katz, NP Primary Cardiologist: Dr Bronson Ing   Patient Profile:   [name] is a 57 y.o. male with a hx of chest pain who is being seen today for the evaluation of syncope at the request of Dr Rogue Jury.  History of Present Illness:   Mr. [name] is a 57 y/o male, known to cardiology, he has had prior caths showing non obstructive CAD in 2005 and 2012. His last functional study was a Nuclear stress in Dec 2016. 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 He was admitted to [facility] 03/13/17 after he reported he had a syncopal spell at home. The pt says he was sitting in his recliner when he developed "tingling" in his left chest and Lt arm. He then says he woke up an hour later. Prior to this he has been doing well, no chest pain or tachycardia. Since admission his B/P and HR have been stable. Telemetry shows no arrhyhtmia. Echo was normal, EEG negative.    Past Medical History:  Diagnosis Date  . Allergic rhinitis, cause unspecified   . Cervicalgia   . Chest pain Sept. 2005   multiple caths  /  cath 06/15/2010..normal coronaries,  EF 60%   (false postive nuclear in the past)  . Dyslipidemia    mixed  . Esophageal reflux   . Fibromyalgia   . Gastroparesis   . Headache(784.0)   . History of kidney stones   . Hyperlipidemia   . Hypertension   . IDDM (insulin dependent diabetes mellitus) ([place])   . Lumbago   . Neuropathy associated with endocrine disorder ([place])   . Overweight(278.02)   . Peripheral vascular disease ([place])   . Pulmonary embolus (HCC)    Hx of small left lower lobe  pulmonary embolus  . Pulmonary embolus ([place]) 2010ish  . Stroke [facility])    mini stroke aug 2016  . Type II or unspecified type diabetes mellitus with neurological manifestations, not stated as uncontrolled(250.60)   . Type II or  unspecified type diabetes mellitus with neurological manifestations, not stated as uncontrolled(250.60)   . Urticaria, unspecified     Past Surgical History:  Procedure Laterality Date  . ACHILLES TENDON REPAIR Left 2013  . AMPUTATION Left 06/02/2013   Procedure: AMPUTATION 1ST TOE LEFT FOOT;  Surgeon: Marcheta Grammes, DPM;  Location: [facility];  Service: Orthopedics;  Laterality: Left;  . AMPUTATION Left 07/21/2013   Procedure: PARTIAL AMPUTATION 2ND TOE LEFT FOOT;  Surgeon: Marcheta Grammes, DPM;  Location: [facility];  Service: Podiatry;  Laterality: Left;  . ANTERIOR CERVICAL DECOMP/DISCECTOMY FUSION N/A 12/29/2016   Procedure: Cervical five-six Cervical six-seven Anterior cervical discectomy with fusion and plate fixation;  Surgeon: Ditty, Kevan Ny, MD;  Location: [place];  Service: Neurosurgery;  Laterality: N/A;  . APPENDECTOMY    . BACK SURGERY  1999   x3  . FOOT ARTHRODESIS Right 01/11/2014   Procedure: ARTHRODESIS INTERPHALANGEAL JOINT HALLUX RIGHT FOOT;  Surgeon: Marcheta Grammes, DPM;  Location: [facility];  Service: Podiatry;  Laterality: Right;  . HARDWARE REMOVAL Right 01/17/2015   Procedure: HARDWARE REMOVAL;  Surgeon: Caprice Beaver, DPM;  Location: [facility];  Service: Podiatry;  Laterality: Right;  . KNEE ARTHROSCOPY Right 10/2015  . LUMBAR LAMINECTOMY/DECOMPRESSION MICRODISCECTOMY Right 01/12/2013   Procedure: Right Lumbar Three-Four Laminotomy/Foraminotomy;  Surgeon: Floyce Stakes, MD;  Location: [place] [facility];  Service: Neurosurgery;  Laterality: Right;  Right Lumbar Three-Four Laminotomy/Foraminotomy  . NISSEN FUNDOPLICATION    . SHOULDER ARTHROSCOPY W/ ROTATOR CUFF REPAIR Right      Home Medications:  Prior to Admission medications   Medication Sig Start Date End Date Taking? Authorizing Provider  aspirin EC 81 MG tablet Take 81 mg by mouth daily.   Yes [provider]  atorvastatin (LIPITOR) 20 MG tablet Take 20 mg by mouth daily.   Yes  [provider]  gabapentin (NEURONTIN) 600 MG tablet TAKE 4 TABLETS BY MOUTH AT BEDTIME. Patient taking differently: TAKE 2400 MG BY MOUTH AT BEDTIME. 02/18/16  Yes Nida, Marella Chimes, MD  LEVEMIR 100 UNIT/ML injection INJECT 60 UNITS INTO THE SKIN AT BEDTIME 03/17/16  Yes Nida, Marella Chimes, MD  metFORMIN (GLUCOPHAGE) 500 MG tablet Take 1 tablet (500 mg total) by mouth 2 (two) times daily with a meal. 2 pills 2 times a day Patient taking differently: Take 1,000 mg 2 (two) times daily with a meal by mouth.  06/20/15  Yes Cassandria Anger, MD  RANEXA 1000 MG SR tablet TAKE 1 TABLET BY MOUTH DAILY 12/30/16  Yes Herminio Commons, MD  canagliflozin (INVOKANA) 100 MG TABS tablet Take 1 tablet (100 mg total) by mouth daily. Patient not taking: Reported on 12/17/2016 06/09/16   Cassandria Anger, MD  methocarbamol (ROBAXIN) 750 MG tablet Take 1 tablet (750 mg total) by mouth 4 (four) times daily. Patient not taking: Reported on 03/13/2017 12/30/16   Ditty, Kevan Ny, MD    Inpatient Medications: Scheduled Meds: . aspirin EC  325 mg Oral Daily  . atorvastatin  20 mg Oral Daily  . gabapentin  2,400 mg Oral QHS  . insulin aspart  0-9 Units Subcutaneous TID WC  . insulin aspart  4 Units Subcutaneous TID WC  . insulin detemir  25 Units Subcutaneous QHS  . ranolazine  1,000 mg Oral Daily   Continuous Infusions: . sodium chloride Stopped (03/16/17 0825)   PRN Meds: acetaminophen, gi cocktail, nitroGLYCERIN, ondansetron (ZOFRAN) IV  Allergies:    Allergies  Allergen Reactions  . Reglan [Metoclopramide] Itching and Rash    Social History:   Social History   Socioeconomic History  . Marital status: Married    Spouse name: Not on file  . Number of children: 2  . Years of education: 10th  . Highest education level: Not on file  Social Needs  . Financial resource strain: Not on file  . Food insecurity - worry: Not on file  . Food insecurity - inability: Not on  file  . Transportation needs - medical: Not on file  . Transportation needs - non-medical: Not on file  Occupational History  . Occupation: Retired- Research officer, trade union  Tobacco Use  . Smoking status: Former Smoker    Packs/day: 3.00    Years: 5.00    Pack years: 15.00    Types: Cigarettes    Start date: 06/20/1977    Last attempt to quit: 07/20/1982    Years since quitting: 34.6  . Smokeless tobacco: Never Used  . Tobacco comment: quit 1980's  Substance and Sexual Activity  . Alcohol use: No    Alcohol/week: 0.0 oz  . Drug use: No  . Sexual activity: Yes    Birth control/protection: None  Other Topics Concern  . Not on file  Social History Narrative  . Not on file    Family History:    Family History  Problem Relation Age of Onset  . Heart attack Father 6  . Hyperlipidemia Father   . Hypertension Father   . Heart attack Mother 74  . Diabetes Mother   . Hypertension Mother   . Hyperlipidemia Mother   . Healthy Brother   . Seizures Brother   . Migraines Neg Hx      ROS:  Please see the history of present illness.  All other ROS reviewed and negative.     Physical Exam/Data:   Vitals:   03/15/17 2155 03/15/17 2300 03/16/17 0100 03/16/17 0504  BP: 133/87 124/72 132/75 122/69  Pulse: 60   63  Resp: 18   17  Temp: 98.2 F (36.8 C)   97.9 F (36.6 C)  TempSrc: Oral   Oral  SpO2: 100%   97%  Weight:      Height:        Intake/Output Summary (Last 24 hours) at 03/16/2017 1355 Last data filed at 03/16/2017 0825 Gross per 24 hour  Intake 3182.5 ml  Output -  Net 3182.5 ml   Filed Weights   03/13/17 2307  Weight: 270 lb 4.5 oz (122.6 kg)   Body mass index is 32.05 kg/m.  General:  Well nourished, well developed, in no acute distress HEENT: normal Lymph: no adenopathy Neck: no JVD Endocrine:  No thryomegaly Vascular: No carotid bruits; FA pulses 2+ bilaterally without bruits  Cardiac:  normal S1, S2; RRR; no murmur Lungs:  clear to auscultation  bilaterally, no wheezing, rhonchi or rales  Abd: soft, nontender, no hepatomegaly  Ext: no edema Musculoskeletal:  No deformities, BUE and BLE strength normal and equal Skin: warm and dry  Neuro:  CNs 2-12 intact, no focal abnormalities noted Psych:  Normal affect   EKG:  The EKG was personally reviewed and demonstrates:  NSR Telemetry:  Telemetry was personally reviewed and demonstrates:  NSR  Relevant CV Studies:  Echo 03/14/17- Study Conclusions  - Left ventricle: Inferobasal hypokinesis The cavity size was   normal. There was mild focal basal hypertrophy of the septum.   Systolic function was normal. The estimated ejection fraction was   in the range of 55% to 60%. Wall motion was normal; there were no   regional wall motion abnormalities. Doppler parameters are   consistent with abnormal left ventricular relaxation (grade 1   diastolic dysfunction). - Mitral valve: Valve area by pressure half-time: 1.55 cm^2. - Left atrium: The atrium was mildly dilated.  Laboratory Data:  Chemistry Recent Labs  Lab 03/14/17 0418 03/15/17 0307 03/16/17 0443  NA 137 135 137  K 4.0 4.5 4.3  CL 104 103 106  CO2 21* 22 20*  GLUCOSE 184* 268* 242*  BUN 22* 23* 24*  CREATININE 1.50* 1.65* 1.66*  CALCIUM 9.2 9.2 8.6*  GFRNONAA 50* 45* 44*  GFRAA 58* 52* 51*  ANIONGAP 12 10 11     Recent Labs  Lab 03/13/17 1700  PROT 7.7  ALBUMIN 4.3  AST 30  ALT 27  ALKPHOS 119  BILITOT 0.3   Hematology Recent Labs  Lab 03/13/17 1700 03/13/17 1714  WBC 7.0  --   RBC 4.49  --   HGB 12.8* 12.9*  HCT 39.0 38.0*  MCV 86.9  --   MCH 28.5  --   MCHC 32.8  --   RDW 13.8  --   PLT 174  --    Cardiac Enzymes Recent Labs  Lab 03/13/17 2110 03/14/17 0418 03/14/17 1005  TROPONINI <0.03 <0.03 <0.03  Recent Labs  Lab 03/13/17 1712 03/13/17 1949  TROPIPOC 0.01 0.02    BNPNo results for input(s): BNP, PROBNP in the last 168 hours.  DDimer No results for input(s): DDIMER in the last  168 hours.  Radiology/Studies:  Dg Chest 2 View  Result Date: 03/13/2017 CLINICAL DATA:  Chest pain. EXAM: CHEST  2 VIEW COMPARISON:  July 14, 2016 FINDINGS: The heart size and mediastinal contours are within normal limits. Both lungs are clear. The visualized skeletal structures are unremarkable. IMPRESSION: No active cardiopulmonary disease. Electronically Signed   By: Dorise Bullion III M.D   On: 03/13/2017 19:41   Mr Brain Wo Contrast (neuro Protocol)  Result Date: 03/13/2017 CLINICAL DATA:  Seizure.  Left arm numbness and left facial droop. EXAM: MRI HEAD WITHOUT CONTRAST TECHNIQUE: Multiplanar, multiecho pulse sequences of the brain and surrounding structures were obtained without intravenous contrast. COMPARISON:  None. FINDINGS: Brain: The midline structures are normal. There is no acute infarct or acute hemorrhage. No mass lesion, hydrocephalus, dural abnormality or extra-axial collection. The brain parenchymal signal is normal. No age-advanced or lobar predominant atrophy. No chronic microhemorrhage or superficial siderosis. Vascular: Major intracranial arterial and venous sinus flow voids are preserved. Skull and upper cervical spine: The visualized skull base, calvarium, upper cervical spine and extracranial soft tissues are normal. Sinuses/Orbits: No fluid levels or advanced mucosal thickening. No mastoid or middle ear effusion. Normal orbits. IMPRESSION: Normal brain MRI. Electronically Signed   By: Ulyses Jarred M.D.   On: 03/13/2017 18:36   Ct Head Code Stroke Wo Contrast  Result Date: 03/13/2017 CLINICAL DATA:  Code stroke. Left facial and arm numbness. Slurred speech. EXAM: CT HEAD WITHOUT CONTRAST TECHNIQUE: Contiguous axial images were obtained from the base of the skull through the vertex without intravenous contrast. COMPARISON:  None FINDINGS: Brain: No mass lesion or acute hemorrhage. No focal hypoattenuation of the basal ganglia or cortex to indicate infarcted tissue. No  hydrocephalus or age advanced atrophy. Vascular: No hyperdense vessel. No advanced atherosclerotic calcification of the arteries at the skull base. Skull: Normal visualized skull base, calvarium and extracranial soft tissues. Sinuses/Orbits: No sinus fluid levels or advanced mucosal thickening. No mastoid effusion. Normal orbits. ASPECTS [facility] Stroke Program Early CT Score) - Ganglionic level infarction (caudate, lentiform nuclei, internal capsule, insula, M1-M3 cortex): 7 - Supraganglionic infarction (M4-M6 cortex): 3 Total score (0-10 with 10 being normal): 10 IMPRESSION: 1. No acute hemorrhage or mass lesion. 2. ASPECTS is 10. These results were called by telephone at the time of interpretation on 03/13/2017 at 5:20 pm to Dr. Fredia Sorrow , who verbally acknowledged these results. Electronically Signed   By: Ulyses Jarred M.D.   On: 03/13/2017 17:22    Assessment and Plan:   Syncope: unusual history. Normal LVF and EEG  Non obstructive CAD- Cath 2005 and 2012, low risk Nuc Dec 2016  IDDM  Dyslipidemia  PVD- s/p Lt foot amputation  Plan: OK for discharge.No driving for 6 months. Check 30 day event monitor. F/U with Dr Bronson Ing to be arranged.    For questions or updates, please contact [place] Please consult [email or web address] for contact info under Cardiology/STEMI.   Angelena Form, PA-C  03/16/2017 1:55 PM

## 2017-03-16 NOTE — Progress Notes (Addendum)
Inpatient Diabetes Program Recommendations  AACE/ADA: New Consensus Statement on Inpatient Glycemic Control (2015)  Target Ranges:  Prepandial:   less than 140 mg/dL      Peak postprandial:   less than 180 mg/dL (1-2 hours)      Critically ill patients:  140 - 180 mg/dL   Lab Results  Component Value Date   GLUCAP 237 (H) 03/16/2017   HGBA1C 8.4 (H) 03/15/2017    Review of Glycemic Control  Results for [name], [name] (MRN [id number]) as of 03/16/2017 09:49  Ref. Range 03/15/2017 12:30 03/15/2017 17:49 03/15/2017 22:25 03/16/2017 08:13  Glucose-Capillary Latest Ref Range: 65 - 99 mg/dL 343 (H) 285 (H) 269 (H) 237 (H)   Diabetes history: Type 2 DM Outpatient Diabetes medications: Invokana 100 mg QD, Metformin 1,000 mg BID Current orders for Inpatient glycemic control: Levemir 10 Units QHS, Novolog 0-9 Units [facility]  Inpatient Diabetes Program Recommendations:    Per Dr Liliane Channel (endocrinologist) note in 2017, patient had been taking Levemir 60 Units QHS.   In reviewing blood sugars during this admission, patient is going to need additional basal.  At this time, recommending Lantus 20 Units QHS and Novolog 0-5 Units QHS.    Thanks, Bronson Curb, MSN, RNC-OB Diabetes Coordinator [phone] (8a-5p)   Addendum: Spoke with patient prior to discharge regarding regimen. Patient states, "I take Levemir 60 Units every night." In talking with him, we discussed hypo and hyper glycemia (survival skills) and interventions to take, when to call the doctor and to be mindful in taking home dose, as patient had not taken this much Levemir while in the hospital. Educated that recommendations were made for Levemir 25 Units QHS. He says he experiences signs and symptoms of hypoglycemia at 100mg /dL, and although this doesn't happen often he was able to verbalize what to do in the event this happens. Patient has a meter and test strips and checks 2-3 times a day. He has Levemir for discharge. Has a PCP,  Dr Cyndi Bender and plans to follow up. Patient states that he has lost over 60lbs in the last year because of increase activity and meal planning. He plans to resume this once discharged. At this time, patient had no further questions.

## 2017-03-16 NOTE — Discharge Instructions (Signed)
Syncope Syncope is when you lose temporarily pass out (faint). Signs that you may be about to pass out include:  Feeling dizzy or light-headed.  Feeling sick to your stomach (nauseous).  Seeing all white or all black.  Having cold, clammy skin.  If you passed out, get help right away. Call your local emergency services (911 in the [place] Do not drive yourself to the hospital. Follow these instructions at home: Pay attention to any changes in your symptoms. Take these actions to help with your condition:  Have someone stay with you until you feel stable.  Do not drive, use machinery, or play sports until your doctor says it is okay.  Keep all follow-up visits as told by your doctor. This is important.  If you start to feel like you might pass out, lie down right away and raise (elevate) your feet above the level of your heart. Breathe deeply and steadily. Wait until all of the symptoms are gone.  Drink enough fluid to keep your pee (urine) clear or pale yellow.  If you are taking blood pressure or heart medicine, get up slowly and spend many minutes getting ready to sit and then stand. This can help with dizziness.  Take over-the-counter and prescription medicines only as told by your doctor.  Get help right away if:  You have a very bad headache.  You have unusual pain in your chest, tummy, or back.  You are bleeding from your mouth or rectum.  You have black or tarry poop (stool).  You have a very fast or uneven heartbeat (palpitations).  It hurts to breathe.  You pass out once or more than once.  You have jerky movements that you cannot control (seizure).  You are confused.  You have trouble walking.  You are very weak.  You have vision problems. These symptoms may be an emergency. Do not wait to see if the symptoms will go away. Get medical help right away. Call your local emergency services (911 in the [place] Do not drive yourself to the hospital. This  information is not intended to replace advice given to you by your health care provider. Make sure you discuss any questions you have with your health care provider. Document Released: 07/09/2007 Document Revised: 06/28/2015 Document Reviewed: 10/04/2014 Elsevier Interactive Patient Education  2018 [employer]  

## 2017-03-16 NOTE — Progress Notes (Addendum)
Pt most recent CBG 437, Hall, MD notified and made aware.

## 2017-03-16 NOTE — Discharge Summary (Signed)
Discharge Summary  [name] [facility] VZD:[id number] DOB: 02/23/1960  PCP: Arsenio Katz, NP  Admit date: 03/13/2017 Discharge date: 03/16/2017  Time spent: 25 minutes  Recommendations for Outpatient Follow-up:  1. Follow up with neurology, cardiology, and PCP post hospitalization 2. Take your medications as prescribed 3. 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 4. Fall precaution  Discharge Diagnoses:  Active Hospital Problems   Diagnosis Date Noted  . Syncope 03/13/2017  . H/O cervical spine surgery 03/13/2017  . CVA (cerebral vascular accident) ([place]) 08/30/2014  . Hypertension   . GERD 01/14/2010    Resolved Hospital Problems  No resolved problems to display.    Discharge Condition: stable  Diet recommendation: resume previous diet   Vitals:   03/16/17 0100 03/16/17 0504  BP: 132/75 122/69  Pulse:  63  Resp:  17  Temp:  97.9 F (36.6 C)  SpO2:  97%    History of present illness:  [name] [place] 57 y.o.malewith medical history significant for but not limited to previous CVA left upper extremity burning/tingling followed by loss of consciousness, waking up about 1.5 hours later with left facial droop.  No account of concerns for associated neurologic symptoms concerning for seizures patient was subsequently transferred to [facility] for further evaluation of syncope to rule out seizures. Unable to completely rule out seizures although EEG was unremarkable.  Hospital course complicated by uncontrolled diabetes. Previously  On the day of discharge the patient was hemodynamically stable. He will need to follow up with cardiology, neurology, and PCP.    Hospital Course:  Principal Problem:   Syncope Active Problems:   GERD   Hypertension   CVA (cerebral vascular accident) ([place])   H/O cervical spine surgery  Syncope: Serial cardiac enzymes negative No acute EKG changes Head CT negative MRI  brain normal 2D echocardiogram -EF 55-60%, left inferior basilar hypokinesis, grade 1 diastolic dysfunction EEG unremarkable Neurology consulted and followed Consider outpatient stress test/30 day cardiac event monitor  Type 2 IDDM: Glycemic control with sliding scale insulin/long-acting insulin Hold metformin till Cr < 1.5  Acute on chronic kidney injury: Baseline creatinine 1.42 on 01/02/2017 Admitting creatinine 1.65 Creatinine 1.66 from 1.65 Avoid nephrotoxic agents/hypotension/dehydration Follow up with your PCP post hospitalization   Procedures:  none  Consultations:  Cardiology  neurology   Discharge Exam: BP 122/69 (BP Location: Left Arm)   Pulse 63   Temp 97.9 F (36.6 C) (Oral)   Resp 17   Ht 6\' 5"  (1.956 m)   Wt 122.6 kg (270 lb 4.5 oz)   SpO2 97%   BMI 32.05 kg/m   General: 57 yo CM WD WN NAD A&O x3 Cardiovascular: RRR no rubs or gallops Respiratory: CTA no wheezes or rales  Discharge Instructions You were cared for by a hospitalist during your hospital stay. If you have any questions about your discharge medications or the care you received while you were in the hospital after you are discharged, you can call the unit and asked to speak with the hospitalist on call if the hospitalist that took care of you is not available. Once you are discharged, your primary care physician will handle any further medical issues. Please note that NO REFILLS for any discharge medications will be authorized once you are discharged, as it is imperative that you return to your primary care physician (or establish a relationship with a primary care physician if you do not have one) for your  aftercare needs so that they can reassess your need for medications and monitor your lab values.   Allergies as of 03/16/2017      Reactions   Reglan [metoclopramide] Itching, Rash      Medication List    STOP taking these medications   canagliflozin 100 MG Tabs tablet Commonly  known as:  INVOKANA   methocarbamol 750 MG tablet Commonly known as:  ROBAXIN     TAKE these medications   aspirin EC 81 MG tablet Take 81 mg by mouth daily.   atorvastatin 40 MG tablet Commonly known as:  LIPITOR Take 1 tablet (40 mg total) by mouth daily. Start taking on:  03/17/2017 What changed:    medication strength  how much to take   gabapentin 600 MG tablet Commonly known as:  NEURONTIN TAKE 4 TABLETS BY MOUTH AT BEDTIME. What changed:    how much to take  how to take this  when to take this   LEVEMIR 100 UNIT/ML injection Generic drug:  insulin detemir INJECT 60 UNITS INTO THE SKIN AT BEDTIME   metFORMIN 500 MG tablet Commonly known as:  GLUCOPHAGE Take 1 tablet (500 mg total) by mouth 2 (two) times daily with a meal. 2 pills 2 times a day What changed:    how much to take  additional instructions   RANEXA 1000 MG SR tablet Generic drug:  ranolazine TAKE 1 TABLET BY MOUTH DAILY      Allergies  Allergen Reactions  . Reglan [Metoclopramide] Itching and Rash   Follow-up Information    Arsenio Katz, NP Follow up.   Specialty:  Nurse Practitioner Contact information: [place] [address] [phone]        [facility] Follow up.   Contact information: [place], [place] [phone]           The results of significant diagnostics from this hospitalization (including imaging, microbiology, ancillary and laboratory) are listed below for reference.    Significant Diagnostic Studies: Dg Chest 2 View  Result Date: 03/13/2017 CLINICAL DATA:  Chest pain. EXAM: CHEST  2 VIEW COMPARISON:  July 14, 2016 FINDINGS: The heart size and mediastinal contours are within normal limits. Both lungs are clear. The visualized skeletal structures are unremarkable. IMPRESSION: No active cardiopulmonary disease. Electronically Signed   By: Dorise Bullion III M.D   On: 03/13/2017 19:41   Ct  Soft Tissue Neck Wo Contrast  Result Date: 02/26/2017 CLINICAL DATA:  Dysphagia EXAM: CT NECK WITHOUT CONTRAST TECHNIQUE: Multidetector CT imaging of the neck was performed following the standard protocol without intravenous contrast. COMPARISON:  CT neck 01/02/2017 FINDINGS: Pharynx and larynx: Normal. No mass or swelling. Salivary glands: No inflammation, mass, or stone. Thyroid: Negative Lymph nodes: No enlarged lymph nodes Vascular: Mild carotid artery calcification bilaterally. Venous contrast not given Limited intracranial: Negative Visualized orbits: Negative Mastoids and visualized paranasal sinuses: Negative Skeleton: ACDF C5 through C7. Hardware in good position. No acute skeletal abnormality. Dental caries and upper molars bilaterally. Upper chest: Negative Other: None IMPRESSION: No acute abnormality in the neck.  No mass or adenopathy ACDF C5-7 Dental caries Electronically Signed   By: Franchot [name] M.D.   On: 02/26/2017 14:49   Mr Brain Wo Contrast (neuro Protocol)  Result Date: 03/13/2017 CLINICAL DATA:  Seizure.  Left arm numbness and left facial droop. EXAM: MRI HEAD WITHOUT CONTRAST TECHNIQUE: Multiplanar, multiecho pulse sequences of the brain and surrounding structures were obtained without  intravenous contrast. COMPARISON:  None. FINDINGS: Brain: The midline structures are normal. There is no acute infarct or acute hemorrhage. No mass lesion, hydrocephalus, dural abnormality or extra-axial collection. The brain parenchymal signal is normal. No age-advanced or lobar predominant atrophy. No chronic microhemorrhage or superficial siderosis. Vascular: Major intracranial arterial and venous sinus flow voids are preserved. Skull and upper cervical spine: The visualized skull base, calvarium, upper cervical spine and extracranial soft tissues are normal. Sinuses/Orbits: No fluid levels or advanced mucosal thickening. No mastoid or middle ear effusion. Normal orbits. IMPRESSION: Normal brain MRI.  Electronically Signed   By: Ulyses Jarred M.D.   On: 03/13/2017 18:36   Ct Head Code Stroke Wo Contrast  Result Date: 03/13/2017 CLINICAL DATA:  Code stroke. Left facial and arm numbness. Slurred speech. EXAM: CT HEAD WITHOUT CONTRAST TECHNIQUE: Contiguous axial images were obtained from the base of the skull through the vertex without intravenous contrast. COMPARISON:  None FINDINGS: Brain: No mass lesion or acute hemorrhage. No focal hypoattenuation of the basal ganglia or cortex to indicate infarcted tissue. No hydrocephalus or age advanced atrophy. Vascular: No hyperdense vessel. No advanced atherosclerotic calcification of the arteries at the skull base. Skull: Normal visualized skull base, calvarium and extracranial soft tissues. Sinuses/Orbits: No sinus fluid levels or advanced mucosal thickening. No mastoid effusion. Normal orbits. ASPECTS [facility] Stroke Program Early CT Score) - Ganglionic level infarction (caudate, lentiform nuclei, internal capsule, insula, M1-M3 cortex): 7 - Supraganglionic infarction (M4-M6 cortex): 3 Total score (0-10 with 10 being normal): 10 IMPRESSION: 1. No acute hemorrhage or mass lesion. 2. ASPECTS is 10. These results were called by telephone at the time of interpretation on 03/13/2017 at 5:20 pm to Dr. Fredia Sorrow , who verbally acknowledged these results. Electronically Signed   By: Ulyses Jarred M.D.   On: 03/13/2017 17:22    Microbiology: No results found for this or any previous visit (from the past 240 hour(s)).   Labs: Basic Metabolic Panel: Recent Labs  Lab 03/13/17 1700 03/13/17 1714 03/14/17 0418 03/15/17 0307 03/16/17 0443  NA 136 138 137 135 137  K 4.6 5.3* 4.0 4.5 4.3  CL 105 106 104 103 106  CO2 20*  --  21* 22 20*  GLUCOSE 275* 266* 184* 268* 242*  BUN 27* 33* 22* 23* 24*  CREATININE 1.65* 1.60* 1.50* 1.65* 1.66*  CALCIUM 9.6  --  9.2 9.2 8.6*   Liver Function Tests: Recent Labs  Lab 03/13/17 1700  AST 30  ALT 27  ALKPHOS 119    BILITOT 0.3  PROT 7.7  ALBUMIN 4.3   No results for input(s): LIPASE, AMYLASE in the last 168 hours. No results for input(s): AMMONIA in the last 168 hours. CBC: Recent Labs  Lab 03/13/17 1700 03/13/17 1714  WBC 7.0  --   NEUTROABS 3.2  --   HGB 12.8* 12.9*  HCT 39.0 38.0*  MCV 86.9  --   PLT 174  --    Cardiac Enzymes: Recent Labs  Lab 03/13/17 2110 03/14/17 0418 03/14/17 1005  TROPONINI <0.03 <0.03 <0.03   BNP: BNP (last 3 results) No results for input(s): BNP in the last 8760 hours.  ProBNP (last 3 results) No results for input(s): PROBNP in the last 8760 hours.  CBG: Recent Labs  Lab 03/15/17 2225 03/16/17 0813 03/16/17 1205 03/16/17 1241 03/16/17 1309  GLUCAP 269* 237* 461* 440* 437*       Signed:  Kayleen Memos, MD [facility] 03/16/2017, 2:10 PM

## 2017-03-16 NOTE — Progress Notes (Signed)
RN made aware  At 1215 pt's CBG was 461. Notified [place] Crane, MD. Ordered to give 10 units of Novolog and recheck CBG  at 1240 was 440. Informed pt to not eat lunch due to sugar's being high, pt stated he was hungry and will eat his lunch tray. Notified Hall, MD, ordered to give another 10 units of Novolog and recheck CBG  @ 1309 it was 437.  Cathlean Marseilles, MD again. Awaiting orders.Will continue to monitor pt.

## 2017-03-24 DIAGNOSIS — Z09 Encounter for follow-up examination after completed treatment for conditions other than malignant neoplasm: Secondary | ICD-10-CM | POA: Diagnosis not present

## 2017-03-24 DIAGNOSIS — Z6834 Body mass index (BMI) 34.0-34.9, adult: Secondary | ICD-10-CM | POA: Diagnosis not present

## 2017-03-24 DIAGNOSIS — E785 Hyperlipidemia, unspecified: Secondary | ICD-10-CM | POA: Diagnosis not present

## 2017-03-24 DIAGNOSIS — Z299 Encounter for prophylactic measures, unspecified: Secondary | ICD-10-CM | POA: Diagnosis not present

## 2017-03-24 DIAGNOSIS — Z87891 Personal history of nicotine dependence: Secondary | ICD-10-CM | POA: Diagnosis not present

## 2017-03-24 DIAGNOSIS — R55 Syncope and collapse: Secondary | ICD-10-CM | POA: Diagnosis not present

## 2017-03-24 DIAGNOSIS — E1165 Type 2 diabetes mellitus with hyperglycemia: Secondary | ICD-10-CM | POA: Diagnosis not present

## 2017-03-29 NOTE — Progress Notes (Signed)
Cardiology Office Note    Date:  03/30/2017   ID:  [name], [name] 1960-05-19, MRN [id number]  PCP:  Arsenio Katz, NP  Cardiologist: Kate Sable, MD    Chief Complaint  Patient presents with  . Hospitalization Follow-up    History of Present Illness:    [name] is a 57 y.o. male with past medical history of abnormal stress tests (catheterizations in 2005 and 2012 showing normal cors), PVD, HTN, HLD, and IDDM who presents to the office today for hospital follow-up.   He was last examined by Dr. Bronson Ing in 04/2016 and denied any recent chest pain or dyspnea on exertion. 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  In the interim, he presented to [facility] ED on 03/13/2017 for evaluation of numbness along his left arm, slurred speech, and memory loss. He did note a syncopal episode and was transferred to [facility] for further evaluation. Cardiology was consulted for his syncopal episode and a 30-day cardiac event monitor was recommended. Neurology also followed the patient and his Head CT and Brain MRI showed no acute findings with EEG being normal as well.   In talking with the patient today, he reports doing well since his recent hospitalization. He has been trying to lift weights and improve his strength along his left upper extremity. He denies any recent chest pain, dyspnea on exertion, orthopnea, PND, or lower extremity edema. No recurrent lightheadedness, dizziness, or presyncope.  He was initially scheduled to have an event monitor placed in [facility] tomorrow but is open to having this today if at all possible.   Past Medical History:  Diagnosis Date  . Allergic rhinitis, cause unspecified   . Cervicalgia   . Chest pain Sept. 2005   multiple caths  /  cath 06/15/2010..normal coronaries,  EF 60%   (false postive nuclear in the past)  . Dyslipidemia    mixed  . Esophageal reflux   . Fibromyalgia   . Gastroparesis   . Headache(784.0)    . History of kidney stones   . Hyperlipidemia   . Hypertension   . IDDM (insulin dependent diabetes mellitus) ([place])   . Lumbago   . Neuropathy associated with endocrine disorder ([place])   . Overweight(278.02)   . Peripheral vascular disease ([place])   . Pulmonary embolus ([facility])    Hx of small left lower lobe  pulmonary embolus  . Pulmonary embolus ([place]) 2010ish  . Stroke [facility])    mini stroke aug 2016  . Type II or unspecified type diabetes mellitus with neurological manifestations, not stated as uncontrolled(250.60)   . Urticaria, unspecified     Past Surgical History:  Procedure Laterality Date  . ACHILLES TENDON REPAIR Left 2013  . AMPUTATION Left 06/02/2013   Procedure: AMPUTATION 1ST TOE LEFT FOOT;  Surgeon: Marcheta Grammes, DPM;  Location: [facility];  Service: Orthopedics;  Laterality: Left;  . AMPUTATION Left 07/21/2013   Procedure: PARTIAL AMPUTATION 2ND TOE LEFT FOOT;  Surgeon: Marcheta Grammes, DPM;  Location: [facility];  Service: Podiatry;  Laterality: Left;  . ANTERIOR CERVICAL DECOMP/DISCECTOMY FUSION N/A 12/29/2016   Procedure: Cervical five-six Cervical six-seven Anterior cervical discectomy with fusion and plate fixation;  Surgeon: Ditty, Kevan Ny, MD;  Location: [facility];  Service: Neurosurgery;  Laterality: N/A;  . APPENDECTOMY    . BACK SURGERY  1999   x3  . FOOT ARTHRODESIS Right 01/11/2014   Procedure: ARTHRODESIS INTERPHALANGEAL JOINT HALLUX RIGHT FOOT;  Surgeon: Leanna Sato  Caprice Beaver, DPM;  Location: [facility];  Service: Podiatry;  Laterality: Right;  . HARDWARE REMOVAL Right 01/17/2015   Procedure: HARDWARE REMOVAL;  Surgeon: Caprice Beaver, DPM;  Location: [facility];  Service: Podiatry;  Laterality: Right;  . KNEE ARTHROSCOPY Right 10/2015  . LUMBAR LAMINECTOMY/DECOMPRESSION MICRODISCECTOMY Right 01/12/2013   Procedure: Right Lumbar Three-Four Laminotomy/Foraminotomy;  Surgeon: Floyce Stakes, MD;  Location: [facility];  Service: Neurosurgery;   Laterality: Right;  Right Lumbar Three-Four Laminotomy/Foraminotomy  . NISSEN FUNDOPLICATION    . SHOULDER ARTHROSCOPY W/ ROTATOR CUFF REPAIR Right     Current Medications: Outpatient Medications Prior to Visit  Medication Sig Dispense Refill  . aspirin EC 81 MG tablet Take 81 mg by mouth daily.    [name] atorvastatin (LIPITOR) 40 MG tablet Take 1 tablet (40 mg total) by mouth daily. 30 tablet 0  . gabapentin (NEURONTIN) 600 MG tablet TAKE 4 TABLETS BY MOUTH AT BEDTIME. (Patient taking differently: Take 1 Tablet in AM and 3 Tablets at PM) 120 tablet 3  . LEVEMIR 100 UNIT/ML injection INJECT 60 UNITS INTO THE SKIN AT BEDTIME 20 mL 2  . metFORMIN (GLUCOPHAGE) 500 MG tablet Take 1 tablet (500 mg total) by mouth 2 (two) times daily with a meal. 2 pills 2 times a day (Patient taking differently: Take 1,000 mg 2 (two) times daily with a meal by mouth. ) 60 tablet 2  . RANEXA 1000 MG SR tablet TAKE 1 TABLET BY MOUTH DAILY 30 tablet 6   No facility-administered medications prior to visit.      Allergies:   Reglan [metoclopramide]   Social History   Socioeconomic History  . Marital status: Married    Spouse name: None  . Number of children: 2  . Years of education: 10th  . Highest education level: None  Social Needs  . Financial resource strain: None  . Food insecurity - worry: None  . Food insecurity - inability: None  . Transportation needs - medical: None  . Transportation needs - non-medical: None  Occupational History  . Occupation: Retired- Research officer, trade union  Tobacco Use  . Smoking status: Former Smoker    Packs/day: 3.00    Years: 5.00    Pack years: 15.00    Types: Cigarettes    Start date: 06/20/1977    Last attempt to quit: 07/20/1982    Years since quitting: 34.7  . Smokeless tobacco: Never Used  . Tobacco comment: quit 1980's  Substance and Sexual Activity  . Alcohol use: No    Alcohol/week: 0.0 oz  . Drug use: No  . Sexual activity: Yes    Birth control/protection: None    Other Topics Concern  . None  Social History Narrative  . None     Family History:  The patient's family history includes Diabetes in his mother; Healthy in his brother; Heart attack (age of onset: 60) in his father; Heart attack (age of onset: 81) in his mother; Hyperlipidemia in his father and mother; Hypertension in his father and mother; Seizures in his brother.   Review of Systems:   Please see the history of present illness.     General:  No chills, fever, night sweats or weight changes. Positive for syncope.  Cardiovascular:  No chest pain, dyspnea on exertion, edema, orthopnea, palpitations, paroxysmal nocturnal dyspnea. Dermatological: No rash, lesions/masses Respiratory: No cough, dyspnea Urologic: No hematuria, dysuria Abdominal:   No nausea, vomiting, diarrhea, bright red blood per rectum, melena, or hematemesis Neurologic:  No visual changes,  changes in mental status. Positive for weakness.   All other systems reviewed and are otherwise negative except as noted above.   Physical Exam:    VS:  BP 136/84   Pulse 87   Ht 6\' 5"  (1.956 m)   Wt 270 lb 12.8 oz (122.8 kg)   SpO2 98%   BMI 32.11 kg/m    General: Well developed, well nourished Caucasian male appearing in no acute distress. Head: Normocephalic, atraumatic, sclera non-icteric, no xanthomas, nares are without discharge.  Neck: No carotid bruits. JVD not elevated.  Lungs: Respirations regular and unlabored, without wheezes or rales.  Heart: Regular rate and rhythm. No S3 or S4.  No murmur, no rubs, or gallops appreciated. Abdomen: Soft, non-tender, non-distended with normoactive bowel sounds. No hepatomegaly. No rebound/guarding. No obvious abdominal masses. Msk:  Strength and tone appear normal for age. No joint deformities or effusions. Extremities: No clubbing or cyanosis. No lower extremiy edema.  Distal pedal pulses are 2+ bilaterally. Neuro: Alert and oriented X 3. Moves all extremities spontaneously. No  focal deficits noted. Psych:  Responds to questions appropriately with a normal affect. Skin: No rashes or lesions noted  Wt Readings from Last 3 Encounters:  03/30/17 270 lb 12.8 oz (122.8 kg)  03/13/17 270 lb 4.5 oz (122.6 kg)  12/29/16 268 lb (121.6 kg)     Studies/Labs Reviewed:   EKG:  EKG is not ordered today.   Recent Labs: 03/13/2017: ALT 27; Hemoglobin 12.9; Platelets 174 03/16/2017: BUN 24; Creatinine, Ser 1.66; Potassium 4.3; Sodium 137   Lipid Panel    Component Value Date/Time   CHOL 151 03/14/2017 1658   TRIG 111 03/14/2017 1658   HDL 33 (L) 03/14/2017 1658   CHOLHDL 4.6 03/14/2017 1658   VLDL 22 03/14/2017 1658   LDLCALC 96 03/14/2017 1658    Additional studies/ records that were reviewed today include:   Echocardiogram: 03/2017 Study Conclusions  - Left ventricle: Inferobasal hypokinesis The cavity size was   normal. There was mild focal basal hypertrophy of the septum.   Systolic function was normal. The estimated ejection fraction was   in the range of 55% to 60%. Wall motion was normal; there were no   regional wall motion abnormalities. Doppler parameters are   consistent with abnormal left ventricular relaxation (grade 1   diastolic dysfunction). - Mitral valve: Valve area by pressure half-time: 1.55 cm^2. - Left atrium: The atrium was mildly dilated.  Cardiac Catheterization: 06/2010 ANGIOGRAPHIC DATA:  The right coronary artery arises somewhat anteriorly.  It is a small nondominant vessel and is normal.  The left main coronary artery is normal.  The left anterior descending artery is a large vessel and is normal.  There is a moderate ramus intermediate branch which is normal.  Left circumflex coronary artery is a dominant vessel.  There is minor narrowing of the third obtuse marginal vessel up to 40%.  Otherwise, the left circumflex system appears normal.  Left ventricular angiography performed in RAO view demonstrates normal left  ventricular size and contractility with ejection fraction estimated at 60%.  FINAL INTERPRETATION: 1. No significant obstructive atherosclerotic coronary artery disease. 2. Normal left ventricular function.   Assessment:    1. Syncope, unspecified syncope type   2. Precordial pain   3. Essential hypertension   4. Pure hypercholesterolemia      Plan:   In order of problems listed above:  1. Syncope - recently admitted for a syncopal event with which he describes as developing  weakness and numbness along his left upper and lower extremities then losing consciousness while sitting in his chair for over an hour. Noted to have facial droop and slurred speech when initially evaluated by family. - Head CT and Brain MRI showed no acute findings. EEG with no evidence of seizure activity. Echo showed a preserved EF with no regional WMA.  - he denies any recurrent syncopal episodes. Overall, his episode seems atypical for a cardiac etiology. An outpatient monitor had been ordered and we will have it placed during today's visit. He has been informed to not drive for 6 months.   2. Precordial pain - history of false positive stress tests with catheterizations in 2005 and 2012 showing normal coronary arteries.  - he denies any recent chest pain or dyspnea on exertion.  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   3. HTN - BP well-controlled at 136/84 during today's visit.  - diet-controlled. Not currently on anti-hypertensive medications.   4. HLD - recent FLP showed total cholesterol of 151, HDL 33, and LDL 96. Atorvastatin was titrated to 40mg  during recent admission and he has experienced headaches since this adjustment. I recommended he try going back to 20mg  daily to see if symptoms improve.    Medication Adjustments/Labs and Tests Ordered: Current medicines are reviewed at length with the patient today.  Concerns regarding medicines are outlined above.  Medication  changes, Labs and Tests ordered today are listed in the Patient Instructions below. Patient Instructions  Medication Instructions:  Your physician recommends that you continue on your current medications as directed. Please refer to the Current Medication list given to you today.   Labwork: NONE   Testing/Procedures: Your physician has recommended that you wear an event monitor. Event monitors are medical devices that record the heart's electrical activity. Doctors most often [place] these monitors to diagnose arrhythmias. Arrhythmias are problems with the speed or rhythm of the heartbeat. The monitor is a small, portable device. You can wear one while you do your normal daily activities. This is usually used to diagnose what is causing palpitations/syncope (passing out).  Follow-Up: Your physician wants you to follow-up in: 1 Year with Dr. Dwana Curd.  You will receive a reminder letter in the mail two months in advance. If you don't receive a letter, please call our office to schedule the follow-up appointment.  Any Other Special Instructions Will Be Listed Below (If Applicable).  If you need a refill on your cardiac medications before your next appointment, please call your pharmacy. Thank you for choosing [facility]!     Signed, Erma Heritage, PA-C  03/30/2017 8:05 PM    [place] [address] [place],  [address] Phone: [phone]

## 2017-03-30 ENCOUNTER — Encounter: Payer: Self-pay | Admitting: Student

## 2017-03-30 ENCOUNTER — Ambulatory Visit (INDEPENDENT_AMBULATORY_CARE_PROVIDER_SITE_OTHER): Payer: Medicare HMO

## 2017-03-30 ENCOUNTER — Ambulatory Visit (INDEPENDENT_AMBULATORY_CARE_PROVIDER_SITE_OTHER): Payer: Medicare HMO | Admitting: Student

## 2017-03-30 VITALS — BP 136/84 | HR 87 | Ht 77.0 in | Wt 270.8 lb

## 2017-03-30 DIAGNOSIS — R55 Syncope and collapse: Secondary | ICD-10-CM

## 2017-03-30 DIAGNOSIS — I10 Essential (primary) hypertension: Secondary | ICD-10-CM

## 2017-03-30 DIAGNOSIS — R072 Precordial pain: Secondary | ICD-10-CM

## 2017-03-30 DIAGNOSIS — E7800 Pure hypercholesterolemia, unspecified: Secondary | ICD-10-CM

## 2017-03-30 NOTE — Patient Instructions (Addendum)
Medication Instructions:  Your physician recommends that you continue on your current medications as directed. Please refer to the Current Medication list given to you today.   Labwork: NONE   Testing/Procedures: Your physician has recommended that you wear an event monitor. Event monitors are medical devices that record the heart's electrical activity. Doctors most often [place] these monitors to diagnose arrhythmias. Arrhythmias are problems with the speed or rhythm of the heartbeat. The monitor is a small, portable device. You can wear one while you do your normal daily activities. This is usually used to diagnose what is causing palpitations/syncope (passing out).    Follow-Up: Your physician wants you to follow-up in: 1 Year with Dr. Dwana Curd.  You will receive a reminder letter in the mail two months in advance. If you don't receive a letter, please call our office to schedule the follow-up appointment.   Any Other Special Instructions Will Be Listed Below (If Applicable).     If you need a refill on your cardiac medications before your next appointment, please call your pharmacy. Thank you for choosing [facility]!

## 2017-04-16 ENCOUNTER — Ambulatory Visit (INDEPENDENT_AMBULATORY_CARE_PROVIDER_SITE_OTHER): Payer: Medicare HMO | Admitting: Otolaryngology

## 2017-04-16 DIAGNOSIS — R490 Dysphonia: Secondary | ICD-10-CM

## 2017-04-16 DIAGNOSIS — R1312 Dysphagia, oropharyngeal phase: Secondary | ICD-10-CM | POA: Diagnosis not present

## 2017-04-24 DIAGNOSIS — N400 Enlarged prostate without lower urinary tract symptoms: Secondary | ICD-10-CM | POA: Diagnosis not present

## 2017-04-24 DIAGNOSIS — Z0000 Encounter for general adult medical examination without abnormal findings: Secondary | ICD-10-CM | POA: Diagnosis not present

## 2017-04-24 DIAGNOSIS — Z7189 Other specified counseling: Secondary | ICD-10-CM | POA: Diagnosis not present

## 2017-04-24 DIAGNOSIS — Z1339 Encounter for screening examination for other mental health and behavioral disorders: Secondary | ICD-10-CM | POA: Diagnosis not present

## 2017-04-24 DIAGNOSIS — Z79899 Other long term (current) drug therapy: Secondary | ICD-10-CM | POA: Diagnosis not present

## 2017-04-24 DIAGNOSIS — Z6833 Body mass index (BMI) 33.0-33.9, adult: Secondary | ICD-10-CM | POA: Diagnosis not present

## 2017-04-24 DIAGNOSIS — Z299 Encounter for prophylactic measures, unspecified: Secondary | ICD-10-CM | POA: Diagnosis not present

## 2017-04-24 DIAGNOSIS — E1165 Type 2 diabetes mellitus with hyperglycemia: Secondary | ICD-10-CM | POA: Diagnosis not present

## 2017-04-24 DIAGNOSIS — R5383 Other fatigue: Secondary | ICD-10-CM | POA: Diagnosis not present

## 2017-04-24 DIAGNOSIS — Z125 Encounter for screening for malignant neoplasm of prostate: Secondary | ICD-10-CM | POA: Diagnosis not present

## 2017-04-24 DIAGNOSIS — Z1331 Encounter for screening for depression: Secondary | ICD-10-CM | POA: Diagnosis not present

## 2017-04-28 ENCOUNTER — Encounter: Payer: Self-pay | Admitting: Internal Medicine

## 2017-05-06 ENCOUNTER — Other Ambulatory Visit (INDEPENDENT_AMBULATORY_CARE_PROVIDER_SITE_OTHER): Payer: Self-pay | Admitting: Otolaryngology

## 2017-05-06 DIAGNOSIS — R1314 Dysphagia, pharyngoesophageal phase: Secondary | ICD-10-CM

## 2017-05-08 ENCOUNTER — Ambulatory Visit (HOSPITAL_COMMUNITY)
Admission: RE | Admit: 2017-05-08 | Discharge: 2017-05-08 | Disposition: A | Discharge status: Home / Self Care | Payer: Medicare HMO | Source: Ambulatory Visit | Attending: Otolaryngology | Admitting: Otolaryngology

## 2017-05-08 DIAGNOSIS — R1310 Dysphagia, unspecified: Secondary | ICD-10-CM | POA: Insufficient documentation

## 2017-05-08 DIAGNOSIS — Z888 Allergy status to other drugs, medicaments and biological substances status: Secondary | ICD-10-CM | POA: Insufficient documentation

## 2017-05-08 DIAGNOSIS — R1314 Dysphagia, pharyngoesophageal phase: Secondary | ICD-10-CM

## 2017-05-25 ENCOUNTER — Ambulatory Visit: Payer: Medicare HMO

## 2017-05-28 ENCOUNTER — Telehealth: Payer: Self-pay | Admitting: *Deleted

## 2017-05-28 NOTE — Telephone Encounter (Signed)
-----   Message from Erma Heritage, [place] sent at 05/27/2017 10:19 AM EDT ----- Please let the patient know his event monitor showed NSR with no significant pauses or arrhythmias noted. Did have one run of extra beats which lasted for less than a few seconds. If he noted any palpitations while wearing the monitor, could consider the use of beta-blocker therapy but if asymptomatic, would not change his medication regimen. With recent echo showing normal pumping function of the heart and labs during recent admission showing no significant electrolyte abnormalities, no further testing is indicated at this time. Thank you.

## 2017-05-28 NOTE — Telephone Encounter (Signed)
Called patient with test results. No answer. Left message to call back.  

## 2017-05-29 DIAGNOSIS — Z6834 Body mass index (BMI) 34.0-34.9, adult: Secondary | ICD-10-CM | POA: Diagnosis not present

## 2017-05-29 DIAGNOSIS — L919 Hypertrophic disorder of the skin, unspecified: Secondary | ICD-10-CM | POA: Diagnosis not present

## 2017-05-29 DIAGNOSIS — L918 Other hypertrophic disorders of the skin: Secondary | ICD-10-CM | POA: Diagnosis not present

## 2017-05-29 DIAGNOSIS — Z299 Encounter for prophylactic measures, unspecified: Secondary | ICD-10-CM | POA: Diagnosis not present

## 2017-06-12 DIAGNOSIS — Z299 Encounter for prophylactic measures, unspecified: Secondary | ICD-10-CM | POA: Diagnosis not present

## 2017-06-12 DIAGNOSIS — L918 Other hypertrophic disorders of the skin: Secondary | ICD-10-CM | POA: Diagnosis not present

## 2017-06-12 DIAGNOSIS — Z713 Dietary counseling and surveillance: Secondary | ICD-10-CM | POA: Diagnosis not present

## 2017-06-12 DIAGNOSIS — Z6834 Body mass index (BMI) 34.0-34.9, adult: Secondary | ICD-10-CM | POA: Diagnosis not present

## 2017-07-26 ENCOUNTER — Other Ambulatory Visit: Payer: Self-pay | Admitting: Cardiovascular Disease

## 2017-07-31 DIAGNOSIS — E1165 Type 2 diabetes mellitus with hyperglycemia: Secondary | ICD-10-CM | POA: Diagnosis not present

## 2017-07-31 DIAGNOSIS — Z299 Encounter for prophylactic measures, unspecified: Secondary | ICD-10-CM | POA: Diagnosis not present

## 2017-07-31 DIAGNOSIS — Z6833 Body mass index (BMI) 33.0-33.9, adult: Secondary | ICD-10-CM | POA: Diagnosis not present

## 2017-07-31 DIAGNOSIS — Z713 Dietary counseling and surveillance: Secondary | ICD-10-CM | POA: Diagnosis not present

## 2017-08-14 DIAGNOSIS — Z6832 Body mass index (BMI) 32.0-32.9, adult: Secondary | ICD-10-CM | POA: Diagnosis not present

## 2017-08-14 DIAGNOSIS — E1165 Type 2 diabetes mellitus with hyperglycemia: Secondary | ICD-10-CM | POA: Diagnosis not present

## 2017-08-14 DIAGNOSIS — Z713 Dietary counseling and surveillance: Secondary | ICD-10-CM | POA: Diagnosis not present

## 2017-08-14 DIAGNOSIS — Z299 Encounter for prophylactic measures, unspecified: Secondary | ICD-10-CM | POA: Diagnosis not present

## 2017-08-14 DIAGNOSIS — E785 Hyperlipidemia, unspecified: Secondary | ICD-10-CM | POA: Diagnosis not present

## 2017-09-02 ENCOUNTER — Emergency Department (HOSPITAL_COMMUNITY)
Admission: EM | Admit: 2017-09-02 | Discharge: 2017-09-02 | Disposition: A | Discharge status: Home / Self Care | Payer: Medicare HMO | Attending: Emergency Medicine | Admitting: Emergency Medicine

## 2017-09-02 ENCOUNTER — Emergency Department (HOSPITAL_COMMUNITY): Payer: Medicare HMO

## 2017-09-02 DIAGNOSIS — M545 Low back pain: Secondary | ICD-10-CM | POA: Diagnosis not present

## 2017-09-02 DIAGNOSIS — Z7982 Long term (current) use of aspirin: Secondary | ICD-10-CM | POA: Insufficient documentation

## 2017-09-02 DIAGNOSIS — M5441 Lumbago with sciatica, right side: Secondary | ICD-10-CM | POA: Diagnosis not present

## 2017-09-02 DIAGNOSIS — S3992XA Unspecified injury of lower back, initial encounter: Secondary | ICD-10-CM | POA: Diagnosis not present

## 2017-09-02 DIAGNOSIS — Z87891 Personal history of nicotine dependence: Secondary | ICD-10-CM | POA: Diagnosis not present

## 2017-09-02 DIAGNOSIS — E119 Type 2 diabetes mellitus without complications: Secondary | ICD-10-CM | POA: Diagnosis not present

## 2017-09-02 DIAGNOSIS — M5431 Sciatica, right side: Secondary | ICD-10-CM | POA: Insufficient documentation

## 2017-09-02 DIAGNOSIS — Z794 Long term (current) use of insulin: Secondary | ICD-10-CM | POA: Diagnosis not present

## 2017-09-02 DIAGNOSIS — I10 Essential (primary) hypertension: Secondary | ICD-10-CM | POA: Diagnosis not present

## 2017-09-02 MED ORDER — HYDROCODONE-ACETAMINOPHEN 5-325 MG PO TABS: 1.0000 | ORAL_TABLET | ORAL | 0 refills | Status: DC | PRN

## 2017-09-02 MED ORDER — IBUPROFEN 600 MG PO TABS: 600.0000 mg | ORAL_TABLET | Freq: Four times a day (QID) | ORAL | 0 refills | Status: DC | PRN

## 2017-09-02 MED ORDER — HYDROCODONE-ACETAMINOPHEN 5-325 MG PO TABS
1.0000 | ORAL_TABLET | Freq: Once | ORAL | Status: AC
  Administered 2017-09-02: 1 via ORAL
  Filled 2017-09-02: qty 1

## 2017-09-02 MED ORDER — METHOCARBAMOL 750 MG PO TABS: 750.0000 mg | ORAL_TABLET | Freq: Four times a day (QID) | ORAL | 0 refills | Status: DC

## 2017-09-02 NOTE — ED Triage Notes (Signed)
Pt fell off ladder 4 weeks ago and is now having right sided back and hip pain. Is able to walk with a limp. Alert and oriented. NAD

## 2017-09-02 NOTE — Discharge Instructions (Signed)
Do not drive within 4 hours of taking hydrocodone as this will make you drowsy.  Avoid lifting,  Bending,  Twisting or any other activity that worsens your pain over the next week.  Apply a heating pad to your lower back 20 minutes 3-4 times daily.  You should get rechecked if your symptoms are not better over the next 5 days,  Or you develop increased pain,  Weakness in your leg(s) or loss of bladder or bowel function - these are symptoms of a worsening condition.  Your xrays today are negative for any sign of acute injury or trauma to your lower back or hardware.

## 2017-09-02 NOTE — ED Provider Notes (Signed)
[facility] EMERGENCY DEPARTMENT Provider Note   CSN: [id number] Arrival date & time: 09/02/17  1549     History   Chief Complaint Chief Complaint  Patient presents with  . Back Pain    HPI [name] is a 57 y.o. male with a history of low back pain with a history of lumbar microdiskectomy with hardware placement fell from a ladder 4 weeks ago (was standing about 2 feet off the ground) landing on his right hip. He reports being pain free initially but over the past 2 weeks has had slowly worsening right lower back pain with radiation to his heel when at its worst.  He denies weakness or numbness in his legs and has had no urinary or fecal incontinence or retention.  He has a muscle relaxer left over from a cervical fusion 2/19 which he has been using and helping him fall asleep.  He has found no other alleviators (and has tried no other medications).  He is most concerned about the possibility of broken hardware in his back from this fall.  The history is provided by the patient.    Past Medical History:  Diagnosis Date  . Allergic rhinitis, cause unspecified   . Cervicalgia   . Chest pain Sept. 2005   multiple caths  /  cath 06/15/2010..normal coronaries,  EF 60%   (false postive nuclear in the past)  . Dyslipidemia    mixed  . Esophageal reflux   . Fibromyalgia   . Gastroparesis   . Headache(784.0)   . History of kidney stones   . Hyperlipidemia   . Hypertension   . IDDM (insulin dependent diabetes mellitus) ([place])   . Lumbago   . Neuropathy associated with endocrine disorder ([place])   . Overweight(278.02)   . Peripheral vascular disease ([place])   . Pulmonary embolus (HCC)    Hx of small left lower lobe  pulmonary embolus  . Pulmonary embolus ([place]) 2010ish  . Stroke [facility])    mini stroke aug 2016  . Type II or unspecified type diabetes mellitus with neurological manifestations, not stated as uncontrolled(250.60)   . Urticaria, unspecified     Patient Active  Problem List   Diagnosis Date Noted  . Numbness   . Syncope 03/13/2017  . H/O cervical spine surgery 03/13/2017  . Cervical spondylosis with radiculopathy 12/29/2016  . CVA (cerebral vascular accident) ([place]) 08/30/2014  . 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 Neuropathy 08/30/2014  . AKI (acute kidney injury) ([place]) 08/30/2014  . Bilateral occipital neuralgia 10/30/2013  . Lumbar stenosis without neurogenic claudication 01/12/2013  . Type 2 diabetes mellitus with vascular disease ([place])   . Hypertension   . Hyperlipidemia   . Gastroparesis   . Pulmonary embolus ([place]) 2012   . Obesity   . GERD 01/14/2010  . Chest pain 10/05/2003    Past Surgical History:  Procedure Laterality Date  . ACHILLES TENDON REPAIR Left 2013  . AMPUTATION Left 06/02/2013   Procedure: AMPUTATION 1ST TOE LEFT FOOT;  Surgeon: Marcheta Grammes, DPM;  Location: [facility];  Service: Orthopedics;  Laterality: Left;  . AMPUTATION Left 07/21/2013   Procedure: PARTIAL AMPUTATION 2ND TOE LEFT FOOT;  Surgeon: Marcheta Grammes, DPM;  Location: [facility];  Service: Podiatry;  Laterality: Left;  . ANTERIOR CERVICAL DECOMP/DISCECTOMY FUSION N/A 12/29/2016   Procedure: Cervical five-six Cervical six-seven Anterior cervical discectomy with fusion and plate fixation;  Surgeon: Ditty, Kevan Ny, MD;  Location: [facility];  Service: Neurosurgery;  Laterality: N/A;  . APPENDECTOMY    . BACK SURGERY  1999   x3  . FOOT ARTHRODESIS Right 01/11/2014   Procedure: ARTHRODESIS INTERPHALANGEAL JOINT HALLUX RIGHT FOOT;  Surgeon: Marcheta Grammes, DPM;  Location: [facility];  Service: Podiatry;  Laterality: Right;  . HARDWARE REMOVAL Right 01/17/2015   Procedure: HARDWARE REMOVAL;  Surgeon: Caprice Beaver, DPM;  Location: [facility];  Service: Podiatry;  Laterality: Right;  . KNEE ARTHROSCOPY Right 10/2015  . LUMBAR LAMINECTOMY/DECOMPRESSION MICRODISCECTOMY Right 01/12/2013   Procedure: Right Lumbar Three-Four  Laminotomy/Foraminotomy;  Surgeon: Floyce Stakes, MD;  Location: [facility];  Service: Neurosurgery;  Laterality: Right;  Right Lumbar Three-Four Laminotomy/Foraminotomy  . NISSEN FUNDOPLICATION    . SHOULDER ARTHROSCOPY W/ ROTATOR CUFF REPAIR Right         Home Medications    Prior to Admission medications   Medication Sig Start Date End Date Taking? Authorizing Provider  aspirin EC 81 MG tablet Take 81 mg by mouth daily.    [provider]  atorvastatin (LIPITOR) 40 MG tablet Take 1 tablet (40 mg total) by mouth daily. 03/17/17   Kayleen Memos, DO  gabapentin (NEURONTIN) 600 MG tablet TAKE 4 TABLETS BY MOUTH AT BEDTIME. Patient taking differently: Take 1 Tablet in AM and 3 Tablets at PM 02/18/16   Nida, Marella Chimes, MD  HYDROcodone-acetaminophen (NORCO/VICODIN) 5-325 MG tablet Take 1 tablet by mouth every 4 (four) hours as needed. 09/02/17   Evalee Jefferson, PA-C  ibuprofen (ADVIL,MOTRIN) 600 MG tablet Take 1 tablet (600 mg total) by mouth every 6 (six) hours as needed. 09/02/17   Evalee Jefferson, PA-C  LEVEMIR 100 UNIT/ML injection INJECT 60 UNITS INTO THE SKIN AT BEDTIME 03/17/16   Cassandria Anger, MD  metFORMIN (GLUCOPHAGE) 500 MG tablet Take 1 tablet (500 mg total) by mouth 2 (two) times daily with a meal. 2 pills 2 times a day Patient taking differently: Take 1,000 mg 2 (two) times daily with a meal by mouth.  06/20/15   Cassandria Anger, MD  methocarbamol (ROBAXIN-750) 750 MG tablet Take 1 tablet (750 mg total) by mouth 4 (four) times daily. 09/02/17   Evalee Jefferson, PA-C  RANEXA 1000 MG SR tablet TAKE 1 TABLET BY MOUTH DAILY 07/27/17   Herminio Commons, MD    Family History Family History  Problem Relation Age of Onset  . Heart attack Father 58  . Hyperlipidemia Father   . Hypertension Father   . Heart attack Mother 54  . Diabetes Mother   . Hypertension Mother   . Hyperlipidemia Mother   . Healthy Brother   . Seizures Brother   . Migraines Neg Hx      Social History Social History   Tobacco Use  . Smoking status: Former Smoker    Packs/day: 3.00    Years: 5.00    Pack years: 15.00    Types: Cigarettes    Start date: 06/20/1977    Last attempt to quit: 07/20/1982    Years since quitting: 35.1  . Smokeless tobacco: Never Used  . Tobacco comment: quit 1980's  Substance Use Topics  . Alcohol use: No    Alcohol/week: 0.0 oz  . Drug use: No     Allergies   Reglan [metoclopramide]   Review of Systems Review of Systems  Constitutional: Negative for fever.  Respiratory: Negative for shortness of breath.   Cardiovascular: Negative for chest pain and leg swelling.  Gastrointestinal: Negative for abdominal distention, abdominal pain and  constipation.  Genitourinary: Negative for difficulty urinating, dysuria, flank pain, frequency and urgency.  Musculoskeletal: Positive for back pain. Negative for gait problem and joint swelling.  Skin: Negative for rash.  Neurological: Negative for weakness and numbness.     Physical Exam Updated Vital Signs BP (!) 149/89 (BP Location: Right Arm)   Pulse 61   Temp 98 F (36.7 C) (Oral)   Resp 16   Ht 6\' 6"  (1.981 m)   Wt 121.6 kg (268 lb)   SpO2 100%   BMI 30.97 kg/m   Physical Exam  Constitutional: He appears well-developed and well-nourished.  HENT:  Head: Normocephalic.  Eyes: Conjunctivae are normal.  Neck: Normal range of motion. Neck supple.  Cardiovascular: Normal rate and intact distal pulses.  Pedal pulses normal.  Pulmonary/Chest: Effort normal.  Abdominal: Soft. Bowel sounds are normal. He exhibits no distension and no mass.  Musculoskeletal: Normal range of motion. He exhibits no edema.       Lumbar back: He exhibits tenderness. He exhibits no bony tenderness, no swelling, no edema and no spasm.       Back:  Midline well healed surgical incisions without midline pain or obvious deformity.  Soft tissue ttp right paralumbar. Negative SLR.   Neurological: He is  alert. He has normal strength. He displays no atrophy and no tremor. No sensory deficit. Gait normal.  Reflex Scores:      Patellar reflexes are 2+ on the right side and 2+ on the left side. No strength deficit noted in hip and knee flexor and extensor muscle groups.  Ankle flexion and extension intact.  Skin: Skin is warm and dry.  Psychiatric: He has a normal mood and affect.  Nursing note and vitals reviewed.    ED Treatments / Results  Labs (all labs ordered are listed, but only abnormal results are displayed) Labs Reviewed - No data to display  EKG None  Radiology Dg Lumbar Spine Complete  Result Date: 09/02/2017 CLINICAL DATA:  Fall from ladder 4 weeks ago. New onset of right-sided low back and hip pain. EXAM: LUMBAR SPINE - COMPLETE 4+ VIEW COMPARISON:  CT abdomen and pelvis 04/06/2016 FINDINGS: Lumbar fusion at L4-5 is again noted. Slight retrolisthesis at L3-4 is stable. Vertebral body heights are maintained. No acute fracture or subluxation is present. Hardware is intact. Visualized pelvis is normal. Bowel gas pattern is unremarkable. IMPRESSION: 1. No acute abnormality. 2. Stable slight retrolisthesis and degenerative change at L3-4, adjacent to the L4-5 fusion. Electronically Signed   By: San Morelle M.D.   On: 09/02/2017 18:25    Procedures Procedures (including critical care time)  Medications Ordered in ED Medications  HYDROcodone-acetaminophen (NORCO/VICODIN) 5-325 MG per tablet 1 tablet (1 tablet Oral Given 09/02/17 1816)     Initial Impression / Assessment and Plan / ED Course  I have reviewed the triage vital signs and the nursing notes.  Pertinent labs & imaging results that were available during my care of the patient were reviewed by me and considered in my medical decision making (see chart for details).     No neuro deficit on exam or by history to suggest emergent or surgical presentation. Discussed worsened sx that should prompt immediate  re-evaluation including distal weakness, bowel/bladder retention/incontinence.  Plan f/u with pcp in 1 week if sx not improving.  Pt prescribed robaxin, ibuprofen, hydrocodone.  Discussed heat tx.  [place] controlled substance database reviewed.         Final Clinical Impressions(s) / ED Diagnoses  Final diagnoses:  Sciatica of right side    ED Discharge Orders        Ordered    HYDROcodone-acetaminophen (NORCO/VICODIN) 5-325 MG tablet  Every 4 hours PRN     09/02/17 1818    methocarbamol (ROBAXIN-750) 750 MG tablet  4 times daily     09/02/17 1818    ibuprofen (ADVIL,MOTRIN) 600 MG tablet  Every 6 hours PRN     09/02/17 1818       Landis Martins 09/02/17 Jodelle Red, MD 09/02/17 [phone]

## 2017-10-20 DIAGNOSIS — Z713 Dietary counseling and surveillance: Secondary | ICD-10-CM | POA: Diagnosis not present

## 2017-10-20 DIAGNOSIS — Z6832 Body mass index (BMI) 32.0-32.9, adult: Secondary | ICD-10-CM | POA: Diagnosis not present

## 2017-10-20 DIAGNOSIS — W57XXXA Bitten or stung by nonvenomous insect and other nonvenomous arthropods, initial encounter: Secondary | ICD-10-CM | POA: Diagnosis not present

## 2017-10-20 DIAGNOSIS — Z299 Encounter for prophylactic measures, unspecified: Secondary | ICD-10-CM | POA: Diagnosis not present

## 2017-10-20 DIAGNOSIS — E785 Hyperlipidemia, unspecified: Secondary | ICD-10-CM | POA: Diagnosis not present

## 2017-10-30 ENCOUNTER — Encounter (HOSPITAL_COMMUNITY): Payer: Self-pay | Admitting: Emergency Medicine

## 2017-10-30 ENCOUNTER — Emergency Department (HOSPITAL_COMMUNITY): Payer: Medicare HMO

## 2017-10-30 ENCOUNTER — Emergency Department (HOSPITAL_COMMUNITY)
Admission: EM | Admit: 2017-10-30 | Discharge: 2017-10-30 | Disposition: A | Discharge status: Home / Self Care | Payer: Medicare HMO | Attending: Emergency Medicine | Admitting: Emergency Medicine

## 2017-10-30 ENCOUNTER — Other Ambulatory Visit: Payer: Self-pay

## 2017-10-30 DIAGNOSIS — R1013 Epigastric pain: Secondary | ICD-10-CM | POA: Diagnosis not present

## 2017-10-30 DIAGNOSIS — E1122 Type 2 diabetes mellitus with diabetic chronic kidney disease: Secondary | ICD-10-CM | POA: Insufficient documentation

## 2017-10-30 DIAGNOSIS — N183 Chronic kidney disease, stage 3 unspecified: Secondary | ICD-10-CM

## 2017-10-30 DIAGNOSIS — Z87891 Personal history of nicotine dependence: Secondary | ICD-10-CM | POA: Insufficient documentation

## 2017-10-30 DIAGNOSIS — Z8673 Personal history of transient ischemic attack (TIA), and cerebral infarction without residual deficits: Secondary | ICD-10-CM | POA: Diagnosis not present

## 2017-10-30 DIAGNOSIS — Z7982 Long term (current) use of aspirin: Secondary | ICD-10-CM | POA: Diagnosis not present

## 2017-10-30 DIAGNOSIS — I129 Hypertensive chronic kidney disease with stage 1 through stage 4 chronic kidney disease, or unspecified chronic kidney disease: Secondary | ICD-10-CM | POA: Insufficient documentation

## 2017-10-30 DIAGNOSIS — N200 Calculus of kidney: Secondary | ICD-10-CM | POA: Diagnosis not present

## 2017-10-30 DIAGNOSIS — R109 Unspecified abdominal pain: Secondary | ICD-10-CM | POA: Diagnosis not present

## 2017-10-30 DIAGNOSIS — Z7984 Long term (current) use of oral hypoglycemic drugs: Secondary | ICD-10-CM | POA: Diagnosis not present

## 2017-10-30 LAB — COMPREHENSIVE METABOLIC PANEL
ALBUMIN: 4.3 g/dL (ref 3.5–5.0)
ALT: 24 U/L (ref 0–44)
AST: 31 U/L (ref 15–41)
Alkaline Phosphatase: 101 U/L (ref 38–126)
Anion gap: 12 (ref 5–15)
BUN: 36 mg/dL — AB (ref 6–20)
CHLORIDE: 110 mmol/L (ref 98–111)
CO2: 15 mmol/L — AB (ref 22–32)
CREATININE: 2.1 mg/dL — AB (ref 0.61–1.24)
Calcium: 9.8 mg/dL (ref 8.9–10.3)
GFR calc Af Amer: 38 mL/min — ABNORMAL LOW (ref >60)
GFR calc non Af Amer: 33 mL/min — ABNORMAL LOW (ref >60)
Glucose, Bld: 110 mg/dL — ABNORMAL HIGH (ref 70–99)
POTASSIUM: 4.8 mmol/L (ref 3.5–5.1)
SODIUM: 137 mmol/L (ref 135–145)
Total Bilirubin: 0.8 mg/dL (ref 0.3–1.2)
Total Protein: 7.6 g/dL (ref 6.5–8.1)

## 2017-10-30 LAB — URINALYSIS, ROUTINE W REFLEX MICROSCOPIC
Bilirubin Urine: NEGATIVE
GLUCOSE, UA: 50 mg/dL — AB
HGB URINE DIPSTICK: NEGATIVE
KETONES UR: NEGATIVE mg/dL
LEUKOCYTES UA: NEGATIVE
Nitrite: NEGATIVE
PH: 5 (ref 5.0–8.0)
Protein, ur: NEGATIVE mg/dL
Specific Gravity, Urine: 1.016 (ref 1.005–1.030)

## 2017-10-30 LAB — CBC
HEMATOCRIT: 40.3 % (ref 39.0–52.0)
Hemoglobin: 13.6 g/dL (ref 13.0–17.0)
MCH: 28.9 pg (ref 26.0–34.0)
MCHC: 33.7 g/dL (ref 30.0–36.0)
MCV: 85.7 fL (ref 78.0–100.0)
PLATELETS: 185 10*3/uL (ref 150–400)
RBC: 4.7 MIL/uL (ref 4.22–5.81)
RDW: 13.8 % (ref 11.5–15.5)
WBC: 10.6 10*3/uL — ABNORMAL HIGH (ref 4.0–10.5)

## 2017-10-30 LAB — LIPASE, BLOOD: Lipase: 49 U/L (ref 11–51)

## 2017-10-30 MED ORDER — MORPHINE SULFATE (PF) 4 MG/ML IV SOLN
4.0000 mg | Freq: Once | INTRAVENOUS | Status: AC
  Administered 2017-10-30: 4 mg via INTRAVENOUS
  Filled 2017-10-30: qty 1

## 2017-10-30 MED ORDER — PANTOPRAZOLE SODIUM 20 MG PO TBEC: 20.0000 mg | DELAYED_RELEASE_TABLET | Freq: Every day | ORAL | 0 refills | Status: DC

## 2017-10-30 MED ORDER — FAMOTIDINE IN NACL 20-0.9 MG/50ML-% IV SOLN
20.0000 mg | Freq: Once | INTRAVENOUS | Status: AC
  Administered 2017-10-30: 20 mg via INTRAVENOUS
  Filled 2017-10-30: qty 50

## 2017-10-30 MED ORDER — SODIUM CHLORIDE 0.9 % IV BOLUS
1000.0000 mL | Freq: Once | INTRAVENOUS | Status: AC
  Administered 2017-10-30: 1000 mL via INTRAVENOUS

## 2017-10-30 MED ORDER — GI COCKTAIL ~~LOC~~
30.0000 mL | Freq: Once | ORAL | Status: AC
  Administered 2017-10-30: 30 mL via ORAL
  Filled 2017-10-30: qty 30

## 2017-10-30 MED ORDER — ONDANSETRON 4 MG PO TBDP: 4.0000 mg | ORAL_TABLET | Freq: Three times a day (TID) | ORAL | 0 refills | Status: DC | PRN

## 2017-10-30 MED ORDER — ONDANSETRON HCL 4 MG/2ML IJ SOLN
4.0000 mg | Freq: Once | INTRAMUSCULAR | Status: AC
  Administered 2017-10-30: 4 mg via INTRAVENOUS
  Filled 2017-10-30: qty 2

## 2017-10-30 MED ORDER — HYDROCODONE-ACETAMINOPHEN 5-325 MG PO TABS: 1.0000 | ORAL_TABLET | ORAL | 0 refills | Status: DC | PRN

## 2017-10-30 NOTE — ED Provider Notes (Signed)
[facility] EMERGENCY DEPARTMENT Provider Note   CSN: [id number] Arrival date & time: 10/30/17  1517     History   Chief Complaint Chief Complaint  Patient presents with  . Abdominal Pain    HPI [name] is a 57 y.o. male.  Pt presents to the ED today with epigastric abd pain.  Pt said pain started on Wednesday, the 25th after eating hot wings.  It is not unusual for pt to eat spicy foods.  He said that it hurts to eat and drink.  He tried pepto-bismol, but that did not help.  He is not meds for gerd at home, although required a nissen fundoplication several years ago.     Past Medical History:  Diagnosis Date  . Allergic rhinitis, cause unspecified   . Cervicalgia   . Chest pain Sept. 2005   multiple caths  /  cath 06/15/2010..normal coronaries,  EF 60%   (false postive nuclear in the past)  . Dyslipidemia    mixed  . Esophageal reflux   . Fibromyalgia   . Gastroparesis   . Headache(784.0)   . History of kidney stones   . Hyperlipidemia   . Hypertension   . IDDM (insulin dependent diabetes mellitus) ([place])   . Lumbago   . Neuropathy associated with endocrine disorder ([place])   . Overweight(278.02)   . Peripheral vascular disease ([place])   . Pulmonary embolus (HCC)    Hx of small left lower lobe  pulmonary embolus  . Pulmonary embolus ([place]) 2010ish  . Stroke [facility])    mini stroke aug 2016  . Type II or unspecified type diabetes mellitus with neurological manifestations, not stated as uncontrolled(250.60)   . Urticaria, unspecified     Patient Active Problem List   Diagnosis Date Noted  . Numbness   . Syncope 03/13/2017  . H/O cervical spine surgery 03/13/2017  . Cervical spondylosis with radiculopathy 12/29/2016  . CVA (cerebral vascular accident) ([place]) 08/30/2014  . 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 Neuropathy 08/30/2014  . AKI (acute kidney injury) ([place]) 08/30/2014  . Bilateral occipital neuralgia 10/30/2013  . Lumbar stenosis  without neurogenic claudication 01/12/2013  . Type 2 diabetes mellitus with vascular disease ([place])   . Hypertension   . Hyperlipidemia   . Gastroparesis   . Pulmonary embolus ([place]) 2012   . Obesity   . GERD 01/14/2010  . Chest pain 10/05/2003    Past Surgical History:  Procedure Laterality Date  . ACHILLES TENDON REPAIR Left 2013  . AMPUTATION Left 06/02/2013   Procedure: AMPUTATION 1ST TOE LEFT FOOT;  Surgeon: Marcheta Grammes, DPM;  Location: [facility];  Service: Orthopedics;  Laterality: Left;  . AMPUTATION Left 07/21/2013   Procedure: PARTIAL AMPUTATION 2ND TOE LEFT FOOT;  Surgeon: Marcheta Grammes, DPM;  Location: [facility];  Service: Podiatry;  Laterality: Left;  . ANTERIOR CERVICAL DECOMP/DISCECTOMY FUSION N/A 12/29/2016   Procedure: Cervical five-six Cervical six-seven Anterior cervical discectomy with fusion and plate fixation;  Surgeon: Ditty, Kevan Ny, MD;  Location: [facility];  Service: Neurosurgery;  Laterality: N/A;  . APPENDECTOMY    . BACK SURGERY  1999   x3  . FOOT ARTHRODESIS Right 01/11/2014   Procedure: ARTHRODESIS INTERPHALANGEAL JOINT HALLUX RIGHT FOOT;  Surgeon: Marcheta Grammes, DPM;  Location: [facility];  Service: Podiatry;  Laterality: Right;  . HARDWARE REMOVAL Right 01/17/2015   Procedure: HARDWARE REMOVAL;  Surgeon: Caprice Beaver, DPM;  Location: [facility];  Service: Podiatry;  Laterality:  Right;  [name] KNEE ARTHROSCOPY Right 10/2015  . LUMBAR LAMINECTOMY/DECOMPRESSION MICRODISCECTOMY Right 01/12/2013   Procedure: Right Lumbar Three-Four Laminotomy/Foraminotomy;  Surgeon: Floyce Stakes, MD;  Location: [facility];  Service: Neurosurgery;  Laterality: Right;  Right Lumbar Three-Four Laminotomy/Foraminotomy  . NISSEN FUNDOPLICATION    . SHOULDER ARTHROSCOPY W/ ROTATOR CUFF REPAIR Right         Home Medications    Prior to Admission medications   Medication Sig Start Date End Date Taking? Authorizing Provider  aspirin EC 81 MG tablet Take 81  mg by mouth daily.   Yes [provider]  atorvastatin (LIPITOR) 40 MG tablet Take 1 tablet (40 mg total) by mouth daily. 03/17/17  Yes Hall, Carole N, DO  gabapentin (NEURONTIN) 600 MG tablet TAKE 4 TABLETS BY MOUTH AT BEDTIME. 02/18/16  Yes Nida, Marella Chimes, MD  LEVEMIR 100 UNIT/ML injection INJECT 60 UNITS INTO THE SKIN AT BEDTIME Patient taking differently: Inject 65 Units into the skin.  03/17/16  Yes Nida, Marella Chimes, MD  metFORMIN (GLUCOPHAGE) 500 MG tablet Take 1 tablet (500 mg total) by mouth 2 (two) times daily with a meal. 2 pills 2 times a day Patient taking differently: Take 1,000 mg 2 (two) times daily with a meal by mouth.  06/20/15  Yes Nida, Marella Chimes, MD  RANEXA 1000 MG SR tablet TAKE 1 TABLET BY MOUTH DAILY 07/27/17  Yes Herminio Commons, MD  HYDROcodone-acetaminophen (NORCO/VICODIN) 5-325 MG tablet Take 1 tablet by mouth every 4 (four) hours as needed. 10/30/17   Isla Pence, MD  methocarbamol (ROBAXIN-750) 750 MG tablet Take 1 tablet (750 mg total) by mouth 4 (four) times daily. Patient not taking: Reported on 10/30/2017 09/02/17   Evalee Jefferson, PA-C  ondansetron (ZOFRAN ODT) 4 MG disintegrating tablet Take 1 tablet (4 mg total) by mouth every 8 (eight) hours as needed. 10/30/17   Isla Pence, MD  pantoprazole (PROTONIX) 20 MG tablet Take 1 tablet (20 mg total) by mouth daily. 10/30/17   Isla Pence, MD    Family History Family History  Problem Relation Age of Onset  . Heart attack Father 57  . Hyperlipidemia Father   . Hypertension Father   . Heart attack Mother 67  . Diabetes Mother   . Hypertension Mother   . Hyperlipidemia Mother   . Healthy Brother   . Seizures Brother   . Migraines Neg Hx     Social History Social History   Tobacco Use  . Smoking status: Former Smoker    Packs/day: 3.00    Years: 5.00    Pack years: 15.00    Types: Cigarettes    Start date: 06/20/1977    Last attempt to quit: 07/20/1982    Years since  quitting: 35.3  . Smokeless tobacco: Never Used  . Tobacco comment: quit 1980's  Substance Use Topics  . Alcohol use: No    Alcohol/week: 0.0 standard drinks  . Drug use: No     Allergies   Reglan [metoclopramide]   Review of Systems Review of Systems  Gastrointestinal: Positive for abdominal pain and nausea.  All other systems reviewed and are negative.    Physical Exam Updated Vital Signs BP (!) 142/91   Pulse (!) 51   Temp (!) 97.1 F (36.2 C) (Oral)   Resp 13   Ht 6\' 5"  (1.956 m)   Wt 118.8 kg   SpO2 100%   BMI 31.07 kg/m   Physical Exam  Constitutional: He is oriented to  person, place, and time. He appears well-developed and well-nourished.  HENT:  Head: Normocephalic and atraumatic.  Mouth/Throat: Oropharynx is clear and moist.  Eyes: Pupils are equal, round, and reactive to light. EOM are normal.  Cardiovascular: Normal rate, regular rhythm, normal heart sounds and intact distal pulses.  Pulmonary/Chest: Effort normal and breath sounds normal.  Abdominal: Normal appearance and bowel sounds are normal. There is tenderness in the epigastric area.  Neurological: He is alert and oriented to person, place, and time.  Skin: Skin is warm and dry. Capillary refill takes less than 2 seconds.  Psychiatric: He has a normal mood and affect. His behavior is normal.  Nursing note and vitals reviewed.    ED Treatments / Results  Labs (all labs ordered are listed, but only abnormal results are displayed) Labs Reviewed  COMPREHENSIVE METABOLIC PANEL - Abnormal; Notable for the following components:      Result Value   CO2 15 (*)    Glucose, Bld 110 (*)    BUN 36 (*)    Creatinine, Ser 2.10 (*)    GFR calc non Af Amer 33 (*)    GFR calc Af Amer 38 (*)    All other components within normal limits  CBC - Abnormal; Notable for the following components:   WBC 10.6 (*)    All other components within normal limits  URINALYSIS, ROUTINE W REFLEX MICROSCOPIC - Abnormal;  Notable for the following components:   Glucose, UA 50 (*)    All other components within normal limits  LIPASE, BLOOD    EKG EKG Interpretation  Date/Time:  Friday October 30 2017 18:25:48 EDT Ventricular Rate:  62 PR Interval:    QRS Duration: 79 QT Interval:  386 QTC Calculation: 392 R Axis:   29 Text Interpretation:  Sinus rhythm Borderline prolonged PR interval Low voltage, precordial leads No significant change since last tracing Confirmed by Isla Pence [phone]) on 10/30/2017 6:27:55 PM   Radiology Ct Abdomen Pelvis Wo Contrast  Result Date: 10/30/2017 CLINICAL DATA:  Epigastric pain since Wednesday night after eating hot wings. EXAM: CT ABDOMEN AND PELVIS WITHOUT CONTRAST TECHNIQUE: Multidetector CT imaging of the abdomen and pelvis was performed following the standard protocol without IV contrast. COMPARISON:  04/06/2016 FINDINGS: Lower chest: No acute abnormality. Hepatobiliary: No focal liver abnormality is seen in limitations of a noncontrast study. No gallstones, gallbladder wall thickening, or biliary dilatation. Pancreas: Normal Spleen: Normal Adrenals/Urinary Tract: Normal bilateral adrenal glands. Faint punctate left-sided renal calculi without obstructive uropathy. The urinary bladder is unremarkable for the degree of distention. Stomach/Bowel: Stomach is within normal limits. Appendix not visualized but no pericecal inflammation is identified. Moderate stool retention within the colon with minimal scattered colonic diverticulosis but without diverticulitis. No evidence of bowel wall thickening, distention, or inflammatory changes. Vascular/Lymphatic: Aortoiliac atherosclerosis without aneurysm. Reproductive: Normal size prostate. Other: Small periumbilical fat containing hernia. No free air nor free fluid. Musculoskeletal: L4-5 interbody fusion with pedicle screws in place and incorporated interbody block noted. No acute osseous abnormality. IMPRESSION: 1. Punctate  nonobstructing calculi in the left kidney. No acute solid nor hollow visceral organ injury of this otherwise noted. 2. L4-5 posterior interbody fusion without complicating features. 3. Mild aortoiliac atherosclerosis. Electronically Signed   By: Ashley Royalty M.D.   On: 10/30/2017 20:40   Dg Abdomen Acute W/chest  Result Date: 10/30/2017 CLINICAL DATA:  Pain which radiates to both flanks. EXAM: DG ABDOMEN ACUTE W/ 1V CHEST COMPARISON:  Chest 03/13/2017. FINDINGS: There is no  evidence of dilated bowel loops or free intraperitoneal air. No radiopaque calculi or other significant radiographic abnormality is seen. Heart size and mediastinal contours are within normal limits. Both lungs are clear. Previous lumbar and cervical fusion. Moderate stool burden. IMPRESSION: Negative abdominal radiographs.  No acute cardiopulmonary disease. Electronically Signed   By: Staci Righter M.D.   On: 10/30/2017 19:31    Procedures Procedures (including critical care time)  Medications Ordered in ED Medications  sodium chloride 0.9 % bolus 1,000 mL (0 mLs Intravenous Stopped 10/30/17 1945)  ondansetron (ZOFRAN) injection 4 mg (4 mg Intravenous Given 10/30/17 1847)  morphine 4 MG/ML injection 4 mg (4 mg Intravenous Given 10/30/17 1847)  famotidine (PEPCID) IVPB 20 mg premix ( Intravenous Stopped 10/30/17 1919)  gi cocktail (Maalox,Lidocaine,Donnatal) (30 mLs Oral Given 10/30/17 1847)     Initial Impression / Assessment and Plan / ED Course  I have reviewed the triage vital signs and the nursing notes.  Pertinent labs & imaging results that were available during my care of the patient were reviewed by me and considered in my medical decision making (see chart for details).    Pt is feeling better.  He will be put on protonix and is instructed to f/u with GI.  He has had CKD for awhile which is getting worse.  Pt is instructed to f/u with pcp regarding this issue.  He knows to return if worse.  Final Clinical  Impressions(s) / ED Diagnoses   Final diagnoses:  Epigastric pain  CKD (chronic kidney disease) stage 3, GFR 30-59 ml/min [facility])    ED Discharge Orders         Ordered    pantoprazole (PROTONIX) 20 MG tablet  Daily     10/30/17 2101    HYDROcodone-acetaminophen (NORCO/VICODIN) 5-325 MG tablet  Every 4 hours PRN     10/30/17 2101    ondansetron (ZOFRAN ODT) 4 MG disintegrating tablet  Every 8 hours PRN     10/30/17 2101           Isla Pence, MD 10/30/17 2120

## 2017-10-30 NOTE — ED Triage Notes (Signed)
Patient advised of impending discharges. Will room as soon as possible.

## 2017-10-30 NOTE — ED Notes (Signed)
Pt and family updated on plan of care, denies any complaints , Dr Gilford Raid at bedside,

## 2017-10-30 NOTE — ED Notes (Signed)
Patient transported to X-ray 

## 2017-10-30 NOTE — ED Triage Notes (Signed)
Pain in epigastric area since Wednesday night after he ate hot wings.

## 2017-10-30 NOTE — ED Triage Notes (Signed)
Delay explained to patient and family.

## 2017-11-04 ENCOUNTER — Encounter (INDEPENDENT_AMBULATORY_CARE_PROVIDER_SITE_OTHER): Payer: Self-pay | Admitting: Internal Medicine

## 2017-11-04 ENCOUNTER — Ambulatory Visit (INDEPENDENT_AMBULATORY_CARE_PROVIDER_SITE_OTHER): Payer: Medicare HMO | Admitting: Internal Medicine

## 2017-11-04 VITALS — BP 162/100 | HR 56 | Temp 98.0°F | Ht 77.0 in | Wt 252.7 lb

## 2017-11-04 DIAGNOSIS — K219 Gastro-esophageal reflux disease without esophagitis: Secondary | ICD-10-CM

## 2017-11-04 DIAGNOSIS — R1013 Epigastric pain: Secondary | ICD-10-CM | POA: Diagnosis not present

## 2017-11-04 MED ORDER — SUCRALFATE 1 GM/10ML PO SUSP: 1.0000 g | Freq: Four times a day (QID) | ORAL | 1 refills | Status: DC

## 2017-11-04 NOTE — Patient Instructions (Addendum)
The risks of bleeding, perforation and infection were reviewed with patient. Rx for Carafate sent to his pharmacy  Gastroesophageal Reflux Disease, Adult Normally, food travels down the esophagus and stays in the stomach to be digested. If a person has gastroesophageal reflux disease (GERD), food and stomach acid move back up into the esophagus. When this happens, the esophagus becomes sore and swollen (inflamed). Over time, GERD can make small holes (ulcers) in the lining of the esophagus. Follow these instructions at home: Diet  Follow a diet as told by your doctor. You may need to avoid foods and drinks such as: ? Coffee and tea (with or without caffeine). ? Drinks that contain alcohol. ? Energy drinks and sports drinks. ? Carbonated drinks or sodas. ? Chocolate and cocoa. ? Peppermint and mint flavorings. ? Garlic and onions. ? Horseradish. ? Spicy and acidic foods, such as peppers, chili powder, curry powder, vinegar, hot sauces, and BBQ sauce. ? Citrus fruit juices and citrus fruits, such as oranges, lemons, and limes. ? Tomato-based foods, such as red sauce, chili, salsa, and pizza with red sauce. ? Fried and fatty foods, such as donuts, french fries, potato chips, and high-fat dressings. ? High-fat meats, such as hot dogs, rib eye steak, sausage, ham, and bacon. ? High-fat dairy items, such as whole milk, butter, and cream cheese.  Eat small meals often. Avoid eating large meals.  Avoid drinking large amounts of liquid with your meals.  Avoid eating meals during the 2-3 hours before bedtime.  Avoid lying down right after you eat.  Do not exercise right after you eat. General instructions  Pay attention to any changes in your symptoms.  Take over-the-counter and prescription medicines only as told by your doctor. Do not take aspirin, ibuprofen, or other NSAIDs unless your doctor says it is okay.  Do not use any tobacco products, including cigarettes, chewing tobacco, and  e-cigarettes. If you need help quitting, ask your doctor.  Wear loose clothes. Do not wear anything tight around your waist.  Raise (elevate) the head of your bed about 6 inches (15 cm).  Try to lower your stress. If you need help doing this, ask your doctor.  If you are overweight, lose an amount of weight that is healthy for you. Ask your doctor about a safe weight loss goal.  Keep all follow-up visits as told by your doctor. This is important. Contact a doctor if:  You have new symptoms.  You lose weight and you do not know why it is happening.  You have trouble swallowing, or it hurts to swallow.  You have wheezing or a cough that keeps happening.  Your symptoms do not get better with treatment.  You have a hoarse voice. Get help right away if:  You have pain in your arms, neck, jaw, teeth, or back.  You feel sweaty, dizzy, or light-headed.  You have chest pain or shortness of breath.  You throw up (vomit) and your throw up looks like blood or coffee grounds.  You pass out (faint).  Your poop (stool) is bloody or black.  You cannot swallow, drink, or eat. This information is not intended to replace advice given to you by your health care provider. Make sure you discuss any questions you have with your health care provider. Document Released: 07/09/2007 Document Revised: 06/28/2015 Document Reviewed: 05/17/2014 Elsevier Interactive Patient Education  2018 [employer].  Gastroparesis Gastroparesis, also called delayed gastric emptying, is a condition in which food takes longer than normal  to empty from the stomach. The condition is usually long-lasting (chronic). What are the causes? This condition may be caused by:  An endocrine disorder, such as hypothyroidism or diabetes. Diabetes is the most common cause of this condition.  A nervous system disease, such as Parkinson disease or multiple sclerosis.  Cancer, infection, or surgery of the stomach or vagus  nerve.  A connective tissue disorder, such as scleroderma.  Certain medicines.  In most cases, the cause is not known. What increases the risk? This condition is more likely to develop in:  People with certain disorders, including endocrine disorders, eating disorders, amyloidosis, and scleroderma.  People with certain diseases, including Parkinson disease or multiple sclerosis.  People with cancer or infection of the stomach or vagus nerve.  People who have had surgery on the stomach or vagus nerve.  People who take certain medicines.  Women.  What are the signs or symptoms? Symptoms of this condition include:  An early feeling of fullness when eating.  Nausea.  Weight loss.  Vomiting.  Heartburn.  Abdominal bloating.  Inconsistent blood glucose levels.  Lack of appetite.  Acid from the stomach coming up into the esophagus (gastroesophageal reflux).  Spasms of the stomach.  Symptoms may come and go. How is this diagnosed? This condition is diagnosed with tests, such as:  Tests that check how long it takes food to move through the stomach and intestines. These tests include: ? Upper gastrointestinal (GI) series. In this test, X-rays of the intestines are taken after you drink a liquid. The liquid makes the intestines show up better on the X-rays. ? Gastric emptying scintigraphy. In this test, scans are taken after you eat food that contains a small amount of radioactive material. ? Wireless capsule GI monitoring system. This test involves swallowing a capsule that records information about movement through the stomach.  Gastric manometry. This test measures electrical and muscular activity in the stomach. It is done with a thin tube that is passed down the throat and into the stomach.  Endoscopy. This test checks for abnormalities in the lining of the stomach. It is done with a long, thin tube that is passed down the throat and into the stomach.  An  ultrasound. This test can help rule out gallbladder disease or pancreatitis as a cause of your symptoms. It uses sound waves to take pictures of the inside of your body.  How is this treated? There is no cure for gastroparesis. This condition may be managed with:  Treatment of the underlying condition causing the gastroparesis.  Lifestyle changes, including exercise and dietary changes. Dietary changes can include: ? Changes in what and when you eat. ? Eating smaller meals more often. ? Eating low-fat foods. ? Eating low-fiber forms of high-fiber foods, such as cooked vegetables instead of raw vegetables. ? Having liquid foods in place of solid foods. Liquid foods are easier to digest.  Medicines. These may be given to control nausea and vomiting and to stimulate stomach muscles.  Getting food through a feeding tube. This may be done in severe cases.  A gastric neurostimulator. This is a device that is inserted into the body with surgery. It helps improve stomach emptying and control nausea and vomiting.  Follow these instructions at home:  Follow your health care provider's instructions about exercise and diet.  Take medicines only as directed by your health care provider. Contact a health care provider if:  Your symptoms do not improve with treatment.  You have new  symptoms. Get help right away if:  You have severe abdominal pain that does not improve with treatment.  You have nausea that does not go away.  You cannot keep fluids down. This information is not intended to replace advice given to you by your health care provider. Make sure you discuss any questions you have with your health care provider. Document Released: 01/20/2005 Document Revised: 06/28/2015 Document Reviewed: 01/16/2014 Elsevier Interactive Patient Education  [employer].

## 2017-11-04 NOTE — Progress Notes (Signed)
Subjective:    Patient ID: [name], male    DOB: 1960-09-16, 57 y.o.   MRN: [id number] Arsenio Katz NP-C  HPI Here today for f/u after recent visit to the ED for epigastric pain (10/30/2017).  Pain started 10/28/2017 after eating hot wings. Patient does eat spicy foods usually with no problems.  Hx of nissen fundoplication several years ago.  CT abdomen/pelvis wo CM:  10/30/2017: 1. Punctate nonobstructing calculi in the left kidney. No acute solid nor hollow visceral organ injury of this otherwise noted. 2. L4-5 posterior interbody fusion without complicating features. 3. Mild aortoiliac atherosclerosis. CBC, UA, CMET, and lipase were normal.  EKG was normal.  HA1C 3 months ago was around 9/ He tells me today he still has epigastric pain.  Rates the pain at an 8/10. The pain is constant. No nausea or vomiting.  Started on Protonix 40 mg daily. No melena or BRRB. BMs move okay. Weight loss which was intentional. He is eating okay somewhat. He is eating toast. Avoid greasy foods. He says he does feel better at all.  Take ASA 81 mg daily.     Echo 03/14/2017 was normal. EF 55-60%  Hx of GERD, PE, Angina, TIA, hypertension, DM, Dyslipidemia.  Diabetic for about 20 yrs.    Urinalysis    Component Value Date/Time   COLORURINE YELLOW 10/30/2017 1842   APPEARANCEUR CLEAR 10/30/2017 1842   LABSPEC 1.016 10/30/2017 1842   PHURINE 5.0 10/30/2017 1842   GLUCOSEU 50 (A) 10/30/2017 1842   HGBUR NEGATIVE 10/30/2017 1842   BILIRUBINUR NEGATIVE 10/30/2017 1842   KETONESUR NEGATIVE 10/30/2017 1842   PROTEINUR NEGATIVE 10/30/2017 1842   UROBILINOGEN 0.2 08/30/2014 1443   NITRITE NEGATIVE 10/30/2017 1842   LEUKOCYTESUR NEGATIVE 10/30/2017 1842    CBC    Component Value Date/Time   WBC 10.6 (H) 10/30/2017 1611   RBC 4.70 10/30/2017 1611   HGB 13.6 10/30/2017 1611   HCT 40.3 10/30/2017 1611   PLT 185 10/30/2017 1611   MCV 85.7 10/30/2017 1611   MCH 28.9 10/30/2017 1611   MCHC 33.7 10/30/2017 1611   RDW 13.8 10/30/2017 1611   LYMPHSABS 2.6 03/13/2017 1700   MONOABS 0.5 03/13/2017 1700   EOSABS 0.6 03/13/2017 1700   BASOSABS 0.1 03/13/2017 1700   CMP Latest Ref Rng & Units 10/30/2017 03/16/2017 03/15/2017  Glucose 70 - 99 mg/dL 110(H) 242(H) 268(H)  BUN 6 - 20 mg/dL 36(H) 24(H) 23(H)  Creatinine 0.61 - 1.24 mg/dL 2.10(H) 1.66(H) 1.65(H)  Sodium 135 - 145 mmol/L 137 137 135  Potassium 3.5 - 5.1 mmol/L 4.8 4.3 4.5  Chloride 98 - 111 mmol/L 110 106 103  CO2 22 - 32 mmol/L 15(L) 20(L) 22  Calcium 8.9 - 10.3 mg/dL 9.8 8.6(L) 9.2  Total Protein 6.5 - 8.1 g/dL 7.6 - -  Total Bilirubin 0.3 - 1.2 mg/dL 0.8 - -  Alkaline Phos 38 - 126 U/L 101 - -  AST 15 - 41 U/L 31 - -  ALT 0 - 44 U/L 24 - -   Lipase     Component Value Date/Time   LIPASE 49 10/30/2017 1611     Review of Systems Past Medical History:  Diagnosis Date  . Allergic rhinitis, cause unspecified   . Cervicalgia   . Chest pain Sept. 2005   multiple caths  /  cath 06/15/2010..normal coronaries,  EF 60%   (false postive nuclear in the past)  . Dyslipidemia    mixed  . Esophageal reflux   .  Fibromyalgia   . Gastroparesis   . Headache(784.0)   . History of kidney stones   . Hyperlipidemia   . Hypertension   . IDDM (insulin dependent diabetes mellitus) ([place])   . Lumbago   . Neuropathy associated with endocrine disorder ([place])   . Overweight(278.02)   . Peripheral vascular disease ([place])   . Pulmonary embolus (HCC)    Hx of small left lower lobe  pulmonary embolus  . Pulmonary embolus ([place]) 2010ish  . Stroke [facility])    mini stroke aug 2016  . Type II or unspecified type diabetes mellitus with neurological manifestations, not stated as uncontrolled(250.60)   . Urticaria, unspecified     Past Surgical History:  Procedure Laterality Date  . ACHILLES TENDON REPAIR Left 2013  . AMPUTATION Left 06/02/2013   Procedure: AMPUTATION 1ST TOE LEFT FOOT;  Surgeon: Marcheta Grammes, DPM;   Location: [facility];  Service: Orthopedics;  Laterality: Left;  . AMPUTATION Left 07/21/2013   Procedure: PARTIAL AMPUTATION 2ND TOE LEFT FOOT;  Surgeon: Marcheta Grammes, DPM;  Location: [facility];  Service: Podiatry;  Laterality: Left;  . ANTERIOR CERVICAL DECOMP/DISCECTOMY FUSION N/A 12/29/2016   Procedure: Cervical five-six Cervical six-seven Anterior cervical discectomy with fusion and plate fixation;  Surgeon: Ditty, Kevan Ny, MD;  Location: [place];  Service: Neurosurgery;  Laterality: N/A;  . APPENDECTOMY    . BACK SURGERY  1999   x3  . FOOT ARTHRODESIS Right 01/11/2014   Procedure: ARTHRODESIS INTERPHALANGEAL JOINT HALLUX RIGHT FOOT;  Surgeon: Marcheta Grammes, DPM;  Location: [facility];  Service: Podiatry;  Laterality: Right;  . HARDWARE REMOVAL Right 01/17/2015   Procedure: HARDWARE REMOVAL;  Surgeon: Caprice Beaver, DPM;  Location: [facility];  Service: Podiatry;  Laterality: Right;  . KNEE ARTHROSCOPY Right 10/2015  . LUMBAR LAMINECTOMY/DECOMPRESSION MICRODISCECTOMY Right 01/12/2013   Procedure: Right Lumbar Three-Four Laminotomy/Foraminotomy;  Surgeon: Floyce Stakes, MD;  [facility];  Service: Neurosurgery;  Laterality: Right;  Right Lumbar Three-Four Laminotomy/Foraminotomy  . NISSEN FUNDOPLICATION    . SHOULDER ARTHROSCOPY W/ ROTATOR CUFF REPAIR Right     Allergies  Allergen Reactions  . Reglan [Metoclopramide] Itching and Rash    Current Outpatient Medications on File Prior to Visit  Medication Sig Dispense Refill  . aspirin EC 81 MG tablet Take 81 mg by mouth daily.    [name] atorvastatin (LIPITOR) 40 MG tablet Take 1 tablet (40 mg total) by mouth daily. 30 tablet 0  . gabapentin (NEURONTIN) 600 MG tablet TAKE 4 TABLETS BY MOUTH AT BEDTIME. 120 tablet 3  . HYDROcodone-acetaminophen (NORCO/VICODIN) 5-325 MG tablet Take 1 tablet by mouth every 4 (four) hours as needed. 10 tablet 0  . LEVEMIR 100 UNIT/ML injection INJECT 60 UNITS INTO THE SKIN AT BEDTIME  (Patient taking differently: Inject 65 Units into the skin. ) 20 mL 2  . metFORMIN (GLUCOPHAGE) 500 MG tablet Take 1 tablet (500 mg total) by mouth 2 (two) times daily with a meal. 2 pills 2 times a day (Patient taking differently: Take 1,000 mg 2 (two) times daily with a meal by mouth. ) 60 tablet 2  . ondansetron (ZOFRAN ODT) 4 MG disintegrating tablet Take 1 tablet (4 mg total) by mouth every 8 (eight) hours as needed. 10 tablet 0  . pantoprazole (PROTONIX) 20 MG tablet Take 1 tablet (20 mg total) by mouth daily. 30 tablet 0  . RANEXA 1000 MG SR tablet TAKE 1 TABLET BY MOUTH DAILY 30 tablet 6   No  current facility-administered medications on file prior to visit.         Objective:   Physical Exam Blood pressure (!) 162/100, pulse (!) 56, temperature 98 F (36.7 C), height 6\' 5"  (1.956 m), weight 252 lb 11.2 oz (114.6 kg). Alert and oriented. Skin warm and dry. Oral mucosa is moist.   . Sclera anicteric, conjunctivae is pink. Thyroid not enlarged. No cervical lymphadenopathy. Lungs clear. Heart regular rate and rhythm.  Abdomen is soft. Bowel sounds are positive. No hepatomegaly. No abdominal masses felt. No tenderness.  No edema to lower extremities.           Assessment & Plan:  GERD/Epigastric pain. EGD with propofol. Needs to be with Propofol for hx of drug addiction.

## 2017-11-05 DIAGNOSIS — Z6831 Body mass index (BMI) 31.0-31.9, adult: Secondary | ICD-10-CM | POA: Diagnosis not present

## 2017-11-05 DIAGNOSIS — Z23 Encounter for immunization: Secondary | ICD-10-CM | POA: Diagnosis not present

## 2017-11-05 DIAGNOSIS — Z299 Encounter for prophylactic measures, unspecified: Secondary | ICD-10-CM | POA: Diagnosis not present

## 2017-11-05 DIAGNOSIS — N183 Chronic kidney disease, stage 3 (moderate): Secondary | ICD-10-CM | POA: Diagnosis not present

## 2017-11-05 DIAGNOSIS — E1122 Type 2 diabetes mellitus with diabetic chronic kidney disease: Secondary | ICD-10-CM | POA: Diagnosis not present

## 2017-11-05 DIAGNOSIS — E1165 Type 2 diabetes mellitus with hyperglycemia: Secondary | ICD-10-CM | POA: Diagnosis not present

## 2017-11-06 ENCOUNTER — Other Ambulatory Visit (INDEPENDENT_AMBULATORY_CARE_PROVIDER_SITE_OTHER): Payer: Self-pay | Admitting: Internal Medicine

## 2017-11-06 DIAGNOSIS — G8929 Other chronic pain: Secondary | ICD-10-CM

## 2017-11-06 DIAGNOSIS — R1013 Epigastric pain: Secondary | ICD-10-CM

## 2017-11-06 DIAGNOSIS — K219 Gastro-esophageal reflux disease without esophagitis: Secondary | ICD-10-CM

## 2017-11-09 DIAGNOSIS — R1013 Epigastric pain: Secondary | ICD-10-CM

## 2017-11-09 DIAGNOSIS — K219 Gastro-esophageal reflux disease without esophagitis: Secondary | ICD-10-CM | POA: Insufficient documentation

## 2017-11-09 DIAGNOSIS — G8929 Other chronic pain: Secondary | ICD-10-CM | POA: Insufficient documentation

## 2017-11-10 ENCOUNTER — Encounter (INDEPENDENT_AMBULATORY_CARE_PROVIDER_SITE_OTHER): Payer: Self-pay | Admitting: *Deleted

## 2017-11-11 ENCOUNTER — Encounter (HOSPITAL_COMMUNITY): Payer: Self-pay

## 2017-11-11 ENCOUNTER — Emergency Department (HOSPITAL_COMMUNITY)
Admission: EM | Admit: 2017-11-11 | Discharge: 2017-11-11 | Disposition: A | Discharge status: Home / Self Care | Payer: Medicare HMO | Attending: Emergency Medicine | Admitting: Emergency Medicine

## 2017-11-11 ENCOUNTER — Emergency Department (HOSPITAL_COMMUNITY): Payer: Medicare HMO

## 2017-11-11 ENCOUNTER — Other Ambulatory Visit: Payer: Self-pay

## 2017-11-11 DIAGNOSIS — R109 Unspecified abdominal pain: Secondary | ICD-10-CM | POA: Diagnosis not present

## 2017-11-11 DIAGNOSIS — Z79899 Other long term (current) drug therapy: Secondary | ICD-10-CM | POA: Insufficient documentation

## 2017-11-11 DIAGNOSIS — Z8673 Personal history of transient ischemic attack (TIA), and cerebral infarction without residual deficits: Secondary | ICD-10-CM | POA: Insufficient documentation

## 2017-11-11 DIAGNOSIS — Z87891 Personal history of nicotine dependence: Secondary | ICD-10-CM | POA: Insufficient documentation

## 2017-11-11 DIAGNOSIS — Z794 Long term (current) use of insulin: Secondary | ICD-10-CM | POA: Insufficient documentation

## 2017-11-11 DIAGNOSIS — E1122 Type 2 diabetes mellitus with diabetic chronic kidney disease: Secondary | ICD-10-CM | POA: Diagnosis not present

## 2017-11-11 DIAGNOSIS — N183 Chronic kidney disease, stage 3 (moderate): Secondary | ICD-10-CM | POA: Insufficient documentation

## 2017-11-11 DIAGNOSIS — Z7982 Long term (current) use of aspirin: Secondary | ICD-10-CM | POA: Insufficient documentation

## 2017-11-11 DIAGNOSIS — R1011 Right upper quadrant pain: Secondary | ICD-10-CM | POA: Diagnosis not present

## 2017-11-11 DIAGNOSIS — I129 Hypertensive chronic kidney disease with stage 1 through stage 4 chronic kidney disease, or unspecified chronic kidney disease: Secondary | ICD-10-CM | POA: Insufficient documentation

## 2017-11-11 LAB — URINALYSIS, ROUTINE W REFLEX MICROSCOPIC
Bilirubin Urine: NEGATIVE
Glucose, UA: 50 mg/dL — AB
Hgb urine dipstick: NEGATIVE
Ketones, ur: NEGATIVE mg/dL
Leukocytes, UA: NEGATIVE
Nitrite: NEGATIVE
Protein, ur: NEGATIVE mg/dL
Specific Gravity, Urine: 1.014 (ref 1.005–1.030)
pH: 5 (ref 5.0–8.0)

## 2017-11-11 MED ORDER — CYCLOBENZAPRINE HCL 10 MG PO TABS
10.0000 mg | ORAL_TABLET | Freq: Once | ORAL | Status: AC
  Administered 2017-11-11: 10 mg via ORAL
  Filled 2017-11-11: qty 1

## 2017-11-11 MED ORDER — FENTANYL CITRATE (PF) 100 MCG/2ML IJ SOLN
50.0000 ug | Freq: Once | INTRAMUSCULAR | Status: AC
  Administered 2017-11-11: 50 ug via INTRAVENOUS
  Filled 2017-11-11: qty 2

## 2017-11-11 MED ORDER — HYDROMORPHONE HCL 1 MG/ML IJ SOLN
1.0000 mg | Freq: Once | INTRAMUSCULAR | Status: AC
  Administered 2017-11-11: 1 mg via INTRAVENOUS
  Filled 2017-11-11: qty 1

## 2017-11-11 NOTE — ED Provider Notes (Signed)
[facility] EMERGENCY DEPARTMENT Provider Note   CSN: [id number] Arrival date & time: 11/11/17  0508  Time seen 5:58 AM   History   Chief Complaint Chief Complaint  Patient presents with  . Flank Pain    HPI [name] is a 56 y.o. male.  HPI patient states he started having "kidney pain" on the right yesterday evening.  He states the pain does radiate into his right upper quadrant.  He states it is a "hurting" pain that is sharp.  The pain is been there constantly.  He denies nausea or vomiting.  He denies hematuria or fever or cough.  He states he is never had this pain before.  He states nothing he does including changing positions big deep breaths makes it hurt more.  Nothing makes it feel better.  He states "I just cannot get comfortable".  Patient denies a history of kidney stones.  He states he was recently diagnosed with stage III kidney disease and is waiting on a referral to a nephrologist.  He is unsure of any family history of kidney stones.  PCP Arsenio Katz, NP   Past Medical History:  Diagnosis Date  . Allergic rhinitis, cause unspecified   . Cervicalgia   . Chest pain Sept. 2005   multiple caths  /  cath 06/15/2010..normal coronaries,  EF 60%   (false postive nuclear in the past)  . Dyslipidemia    mixed  . Esophageal reflux   . Fibromyalgia   . Gastroparesis   . Headache(784.0)   . History of kidney stones   . Hyperlipidemia   . Hypertension   . IDDM (insulin dependent diabetes mellitus) ([place])   . Lumbago   . Neuropathy associated with endocrine disorder ([place])   . Overweight(278.02)   . Peripheral vascular disease ([place])   . Pulmonary embolus (HCC)    Hx of small left lower lobe  pulmonary embolus  . Pulmonary embolus ([place]) 2010ish  . Stroke [facility])    mini stroke aug 2016  . Type II or unspecified type diabetes mellitus with neurological manifestations, not stated as uncontrolled(250.60)   . Urticaria, unspecified     Patient Active Problem  List   Diagnosis Date Noted  . Gastroesophageal reflux disease without esophagitis 11/09/2017  . Abdominal pain, chronic, epigastric 11/09/2017  . Numbness   . Syncope 03/13/2017  . H/O cervical spine surgery 03/13/2017  . Cervical spondylosis with radiculopathy 12/29/2016  . CVA (cerebral vascular accident) ([place]) 08/30/2014  . 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 Neuropathy 08/30/2014  . AKI (acute kidney injury) ([place]) 08/30/2014  . Bilateral occipital neuralgia 10/30/2013  . Lumbar stenosis without neurogenic claudication 01/12/2013  . Type 2 diabetes mellitus with vascular disease ([place])   . Hypertension   . Hyperlipidemia   . Gastroparesis   . Pulmonary embolus ([place]) 2012   . Obesity   . GERD 01/14/2010  . Chest pain 10/05/2003    Past Surgical History:  Procedure Laterality Date  . ACHILLES TENDON REPAIR Left 2013  . AMPUTATION Left 06/02/2013   Procedure: AMPUTATION 1ST TOE LEFT FOOT;  Surgeon: Marcheta Grammes, DPM;  Location[facility];  Service: Orthopedics;  Laterality: Left;  . AMPUTATION Left 07/21/2013   Procedure: PARTIAL AMPUTATION 2ND TOE LEFT FOOT;  Surgeon: Marcheta Grammes, DPM;  Location[facility];  Service: Podiatry;  Laterality: Left;  . ANTERIOR CERVICAL DECOMP/DISCECTOMY FUSION N/A 12/29/2016   Procedure: Cervical five-six Cervical six-seven Anterior cervical discectomy with fusion and  plate fixation;  Surgeon: Ditty, Kevan Ny, MD;  Location: [place];  Service: Neurosurgery;  Laterality: N/A;  . APPENDECTOMY    . BACK SURGERY  1999   x3  . FOOT ARTHRODESIS Right 01/11/2014   Procedure: ARTHRODESIS INTERPHALANGEAL JOINT HALLUX RIGHT FOOT;  Surgeon: Marcheta Grammes, DPM;  Location[facility];  Service: Podiatry;  Laterality: Right;  . HARDWARE REMOVAL Right 01/17/2015   Procedure: HARDWARE REMOVAL;  Surgeon: Caprice Beaver, DPM;  Location[facility];  Service: Podiatry;  Laterality: Right;  . KNEE ARTHROSCOPY Right 10/2015  .  LUMBAR LAMINECTOMY/DECOMPRESSION MICRODISCECTOMY Right 01/12/2013   Procedure: Right Lumbar Three-Four Laminotomy/Foraminotomy;  Surgeon: Floyce Stakes, MD;  [facility];  Service: Neurosurgery;  Laterality: Right;  Right Lumbar Three-Four Laminotomy/Foraminotomy  . NISSEN FUNDOPLICATION    . SHOULDER ARTHROSCOPY W/ ROTATOR CUFF REPAIR Right         Home Medications    Prior to Admission medications   Medication Sig Start Date End Date Taking? Authorizing Provider  aspirin EC 81 MG tablet Take 81 mg by mouth daily.    [provider]  atorvastatin (LIPITOR) 40 MG tablet Take 1 tablet (40 mg total) by mouth daily. 03/17/17   Kayleen Memos, DO  gabapentin (NEURONTIN) 600 MG tablet TAKE 4 TABLETS BY MOUTH AT BEDTIME. 02/18/16   Nida, Marella Chimes, MD  HYDROcodone-acetaminophen (NORCO/VICODIN) 5-325 MG tablet Take 1 tablet by mouth every 4 (four) hours as needed. 10/30/17   Isla Pence, MD  LEVEMIR 100 UNIT/ML injection INJECT 60 UNITS INTO THE SKIN AT BEDTIME Patient taking differently: Inject 65 Units into the skin.  03/17/16   Cassandria Anger, MD  metFORMIN (GLUCOPHAGE) 500 MG tablet Take 1 tablet (500 mg total) by mouth 2 (two) times daily with a meal. 2 pills 2 times a day Patient taking differently: Take 1,000 mg 2 (two) times daily with a meal by mouth.  06/20/15   Cassandria Anger, MD  ondansetron (ZOFRAN ODT) 4 MG disintegrating tablet Take 1 tablet (4 mg total) by mouth every 8 (eight) hours as needed. 10/30/17   Isla Pence, MD  pantoprazole (PROTONIX) 20 MG tablet Take 1 tablet (20 mg total) by mouth daily. 10/30/17   Isla Pence, MD  RANEXA 1000 MG SR tablet TAKE 1 TABLET BY MOUTH DAILY 07/27/17   Herminio Commons, MD  sucralfate (CARAFATE) 1 GM/10ML suspension Take 10 mLs (1 g total) by mouth 4 (four) times daily. 11/04/17   Setzer, Rona Ravens, NP    Family History Family History  Problem Relation Age of Onset  . Heart attack Father 45   . Hyperlipidemia Father   . Hypertension Father   . Heart attack Mother 23  . Diabetes Mother   . Hypertension Mother   . Hyperlipidemia Mother   . Healthy Brother   . Seizures Brother   . Migraines Neg Hx     Social History Social History   Tobacco Use  . Smoking status: Former Smoker    Packs/day: 3.00    Years: 5.00    Pack years: 15.00    Types: Cigarettes    Start date: 06/20/1977    Last attempt to quit: 07/20/1982    Years since quitting: 35.3  . Smokeless tobacco: Never Used  . Tobacco comment: quit 1980's  Substance Use Topics  . Alcohol use: No    Alcohol/week: 0.0 standard drinks  . Drug use: No     Allergies   Reglan [metoclopramide]  Review of Systems Review of Systems  All other systems reviewed and are negative.    Physical Exam Updated Vital Signs BP 138/89 (BP Location: Right Arm)   Pulse 64   Temp 97.9 F (36.6 C) (Oral)   Resp 16   Ht 6\' 5"  (1.956 m)   Wt 114.3 kg   SpO2 99%   BMI 29.88 kg/m   Vital signs normal    Physical Exam  Constitutional: He is oriented to person, place, and time. He appears well-developed and well-nourished.  Non-toxic appearance. He does not appear ill. No distress.  HENT:  Head: Normocephalic and atraumatic.  Right Ear: External ear normal.  Left Ear: External ear normal.  Nose: Nose normal. No mucosal edema or rhinorrhea.  Mouth/Throat: Oropharynx is clear and moist and mucous membranes are normal. No dental abscesses or uvula swelling.  Eyes: Pupils are equal, round, and reactive to light. Conjunctivae and EOM are normal.  Neck: Normal range of motion and full passive range of motion without pain. Neck supple.  Cardiovascular: Normal rate, regular rhythm and normal heart sounds. Exam reveals no gallop and no friction rub.  No murmur heard. Pulmonary/Chest: Effort normal and breath sounds normal. No respiratory distress. He has no wheezes. He has no rhonchi. He has no rales. He exhibits no tenderness  and no crepitus.  Abdominal: Soft. Normal appearance and bowel sounds are normal. He exhibits no distension. There is no tenderness. There is no rebound and no guarding.  Although he states his pain radiates into the right upper abdomen he is nontender to palpation.  Musculoskeletal: Normal range of motion. He exhibits no edema or tenderness.  Moves all extremities well.  Patient is tender to percussion on his right flank area.  He is even tender with light touch.  When I do lumbar range of motion he has pain when he flexes to the right and the right flank area.  He does not have pain when he flexes to the left or has forward flexion.  Neurological: He is alert and oriented to person, place, and time. He has normal strength. No cranial nerve deficit.  Skin: Skin is warm, dry and intact. No rash noted. No erythema. No pallor.  There is no rash seen.  Psychiatric: He has a normal mood and affect. His speech is normal and behavior is normal. His mood appears not anxious.  Nursing note and vitals reviewed.    ED Treatments / Results  Labs (all labs ordered are listed, but only abnormal results are displayed) Labs Reviewed  URINALYSIS, ROUTINE W REFLEX MICROSCOPIC    EKG None  Radiology US Renal  Result Date: 11/11/2017 CLINICAL DATA:  Right flank pain. Underlying stage III chronic renal disease EXAM: RENAL / URINARY TRACT ULTRASOUND COMPLETE COMPARISON:  CT abdomen and pelvis November 01, 2017 FINDINGS: Right Kidney: Length: 12.3 cm. Echogenicity and renal cortical thickness are within normal limits. No mass or perinephric fluid visualized. There is mild fullness of the right renal pelvis. There is no caliectasis on the right. No sonographically demonstrable calculus or ureterectasis. Left Kidney: Length: 10.6 cm. Echogenicity and renal cortical thickness are within normal limits. No mass or perinephric fluid visualized. Slight fullness of the left renal pelvis is noted. No caliectasis on the  left. No sonographically demonstrable calculus or ureterectasis. Bladder: Appears normal for degree of bladder distention. Flow from the distal right ureter is seen by color Doppler examination within the bladder. Flow from the left side is not demonstrated by color  Doppler. IMPRESSION: Slight fullness of each renal pelvis without caliectasis on either side. No obstructing foci identified. Study otherwise unremarkable. The lack of visualization of flow from the distal left ureter in the bladder by color Doppler examination is of uncertain clinical significance. Electronically Signed   By: Lowella Grip III M.D.   On: 11/11/2017 07:31    Procedures Procedures (including critical care time)  Medications Ordered in ED Medications  fentaNYL (SUBLIMAZE) injection 50 mcg (50 mcg Intravenous Given 11/11/17 0621)  cyclobenzaprine (FLEXERIL) tablet 10 mg (10 mg Oral Given 11/11/17 5701)     Initial Impression / Assessment and Plan / ED Course  I have reviewed the triage vital signs and the nursing notes.  Pertinent labs & imaging results that were available during my care of the patient were reviewed by me and considered in my medical decision making (see chart for details).  Patient was given fentanyl IM and oral Flexeril.  He just had a CT of his abdomen last month and he has had several over the past few years.  Ultrasound will be here at 7 AM.  Ultrasound of the kidneys was ordered to look for signs of a kidney stone.  Some of his discomfort appears to be musculoskeletal.  Dr Wilson Singer will finish his disposition    Review of the [place] shows patient got 15 hydrocodone in 2017, #20 tramadol and 2018, and #20 hydrocodone 5/325 on August 1 and #10 hydrocodone 5/325 on September 28.  These are all prescribed from the ED.  Final Clinical Impressions(s) / ED Diagnoses   Final diagnoses:  Acute right flank pain    Disposition pending  Rolland Porter, MD, [place]ette Or,  MD 11/11/17 [phone]

## 2017-11-11 NOTE — ED Triage Notes (Signed)
Pt states a week ago he was dx'd with stage 3 kidney disease, has been taking hydrocodone q4hr for pain. States 'kidney pain is sharp and radiates to the front'. Denies kidney stone hx. No n/v/d

## 2017-11-11 NOTE — ED Notes (Signed)
Advised patient not to drive after discharge due to narcotic medication administration. Patient verbalized understanding.

## 2017-12-11 ENCOUNTER — Encounter (HOSPITAL_COMMUNITY): Payer: Self-pay

## 2017-12-11 ENCOUNTER — Encounter (HOSPITAL_COMMUNITY)
Admission: RE | Admit: 2017-12-11 | Discharge: 2017-12-11 | Disposition: A | Discharge status: Home / Self Care | Payer: Medicare HMO | Source: Ambulatory Visit | Attending: Internal Medicine | Admitting: Internal Medicine

## 2017-12-18 ENCOUNTER — Encounter (HOSPITAL_COMMUNITY): Payer: Self-pay

## 2017-12-18 ENCOUNTER — Ambulatory Visit (HOSPITAL_COMMUNITY): Payer: Medicare HMO | Admitting: Anesthesiology

## 2017-12-18 ENCOUNTER — Encounter (HOSPITAL_COMMUNITY)
Admission: RE | Disposition: A | Discharge status: Home / Self Care | Payer: Self-pay | Source: Ambulatory Visit | Attending: Internal Medicine

## 2017-12-18 ENCOUNTER — Other Ambulatory Visit (INDEPENDENT_AMBULATORY_CARE_PROVIDER_SITE_OTHER): Payer: Self-pay | Admitting: *Deleted

## 2017-12-18 ENCOUNTER — Telehealth (INDEPENDENT_AMBULATORY_CARE_PROVIDER_SITE_OTHER): Payer: Self-pay | Admitting: *Deleted

## 2017-12-18 ENCOUNTER — Ambulatory Visit (HOSPITAL_COMMUNITY)
Admission: RE | Admit: 2017-12-18 | Discharge: 2017-12-18 | Disposition: A | Discharge status: Home / Self Care | Payer: Medicare HMO | Source: Ambulatory Visit | Attending: Internal Medicine | Admitting: Internal Medicine

## 2017-12-18 DIAGNOSIS — Z9889 Other specified postprocedural states: Secondary | ICD-10-CM | POA: Insufficient documentation

## 2017-12-18 DIAGNOSIS — Z87891 Personal history of nicotine dependence: Secondary | ICD-10-CM | POA: Insufficient documentation

## 2017-12-18 DIAGNOSIS — K2970 Gastritis, unspecified, without bleeding: Secondary | ICD-10-CM

## 2017-12-18 DIAGNOSIS — Z89422 Acquired absence of other left toe(s): Secondary | ICD-10-CM | POA: Insufficient documentation

## 2017-12-18 DIAGNOSIS — I10 Essential (primary) hypertension: Secondary | ICD-10-CM | POA: Diagnosis not present

## 2017-12-18 DIAGNOSIS — E785 Hyperlipidemia, unspecified: Secondary | ICD-10-CM | POA: Insufficient documentation

## 2017-12-18 DIAGNOSIS — Z8673 Personal history of transient ischemic attack (TIA), and cerebral infarction without residual deficits: Secondary | ICD-10-CM | POA: Insufficient documentation

## 2017-12-18 DIAGNOSIS — Z79899 Other long term (current) drug therapy: Secondary | ICD-10-CM | POA: Diagnosis not present

## 2017-12-18 DIAGNOSIS — K209 Esophagitis, unspecified: Secondary | ICD-10-CM | POA: Diagnosis not present

## 2017-12-18 DIAGNOSIS — Z7982 Long term (current) use of aspirin: Secondary | ICD-10-CM | POA: Insufficient documentation

## 2017-12-18 DIAGNOSIS — E1140 Type 2 diabetes mellitus with diabetic neuropathy, unspecified: Secondary | ICD-10-CM | POA: Insufficient documentation

## 2017-12-18 DIAGNOSIS — E669 Obesity, unspecified: Secondary | ICD-10-CM | POA: Diagnosis not present

## 2017-12-18 DIAGNOSIS — E1143 Type 2 diabetes mellitus with diabetic autonomic (poly)neuropathy: Secondary | ICD-10-CM | POA: Insufficient documentation

## 2017-12-18 DIAGNOSIS — G8929 Other chronic pain: Secondary | ICD-10-CM

## 2017-12-18 DIAGNOSIS — K228 Other specified diseases of esophagus: Secondary | ICD-10-CM

## 2017-12-18 DIAGNOSIS — Z6832 Body mass index (BMI) 32.0-32.9, adult: Secondary | ICD-10-CM | POA: Diagnosis not present

## 2017-12-18 DIAGNOSIS — R1013 Epigastric pain: Secondary | ICD-10-CM | POA: Insufficient documentation

## 2017-12-18 DIAGNOSIS — Z794 Long term (current) use of insulin: Secondary | ICD-10-CM | POA: Diagnosis not present

## 2017-12-18 DIAGNOSIS — K3184 Gastroparesis: Secondary | ICD-10-CM | POA: Insufficient documentation

## 2017-12-18 DIAGNOSIS — K2950 Unspecified chronic gastritis without bleeding: Secondary | ICD-10-CM | POA: Insufficient documentation

## 2017-12-18 DIAGNOSIS — K2270 Barrett's esophagus without dysplasia: Secondary | ICD-10-CM | POA: Diagnosis not present

## 2017-12-18 DIAGNOSIS — K219 Gastro-esophageal reflux disease without esophagitis: Secondary | ICD-10-CM

## 2017-12-18 DIAGNOSIS — E1151 Type 2 diabetes mellitus with diabetic peripheral angiopathy without gangrene: Secondary | ICD-10-CM | POA: Diagnosis not present

## 2017-12-18 DIAGNOSIS — Z86711 Personal history of pulmonary embolism: Secondary | ICD-10-CM | POA: Diagnosis not present

## 2017-12-18 DIAGNOSIS — K3189 Other diseases of stomach and duodenum: Secondary | ICD-10-CM | POA: Diagnosis not present

## 2017-12-18 HISTORY — PX: BIOPSY: SHX5522

## 2017-12-18 HISTORY — PX: ESOPHAGOGASTRODUODENOSCOPY (EGD) WITH PROPOFOL: SHX5813

## 2017-12-18 LAB — GLUCOSE, CAPILLARY
GLUCOSE-CAPILLARY: 138 mg/dL — AB (ref 70–99)
Glucose-Capillary: 192 mg/dL — ABNORMAL HIGH (ref 70–99)

## 2017-12-18 SURGERY — ESOPHAGOGASTRODUODENOSCOPY (EGD) WITH PROPOFOL
Anesthesia: Monitor Anesthesia Care

## 2017-12-18 MED ORDER — HYDROMORPHONE HCL 1 MG/ML IJ SOLN: 0.2500 mg | INTRAMUSCULAR | Status: DC | PRN

## 2017-12-18 MED ORDER — PROPOFOL 500 MG/50ML IV EMUL
INTRAVENOUS | Status: DC | PRN
  Administered 2017-12-18: 150 ug/kg/min via INTRAVENOUS

## 2017-12-18 MED ORDER — LACTATED RINGERS IV SOLN
INTRAVENOUS | Status: DC
  Administered 2017-12-18: 08:00:00 via INTRAVENOUS

## 2017-12-18 MED ORDER — MEPERIDINE HCL 100 MG/ML IJ SOLN: 6.2500 mg | INTRAMUSCULAR | Status: DC | PRN

## 2017-12-18 MED ORDER — CHLORHEXIDINE GLUCONATE CLOTH 2 % EX PADS: 6.0000 | MEDICATED_PAD | Freq: Once | CUTANEOUS | Status: DC

## 2017-12-18 MED ORDER — PROMETHAZINE HCL 25 MG/ML IJ SOLN: 6.2500 mg | INTRAMUSCULAR | Status: DC | PRN

## 2017-12-18 MED ORDER — LACTATED RINGERS IV SOLN: INTRAVENOUS | Status: DC

## 2017-12-18 MED ORDER — PROPOFOL 10 MG/ML IV BOLUS
INTRAVENOUS | Status: DC | PRN
  Administered 2017-12-18: 60 mg via INTRAVENOUS
  Administered 2017-12-18: 40 mg via INTRAVENOUS

## 2017-12-18 MED ORDER — STERILE WATER FOR IRRIGATION IR SOLN
Status: DC | PRN
  Administered 2017-12-18: 1.5 mL

## 2017-12-18 MED ORDER — HYDROCODONE-ACETAMINOPHEN 7.5-325 MG PO TABS: 1.0000 | ORAL_TABLET | Freq: Once | ORAL | Status: DC | PRN

## 2017-12-18 NOTE — Telephone Encounter (Signed)
[place] SCH'D 12/25/17 AT 930 (915), NPO AFTER MIDNIGHT, PATIENT AWARE

## 2017-12-18 NOTE — Discharge Instructions (Signed)
Esophagogastroduodenoscopy, Care After Refer to this sheet in the next few weeks. These instructions provide you with information about caring for yourself after your procedure. Your health care provider may also give you more specific instructions. Your treatment has been planned according to current medical practices, but problems sometimes occur. Call your health care provider if you have any problems or questions after your procedure. What can I expect after the procedure? After the procedure, it is common to have:  A sore throat.  Nausea.  Bloating.  Dizziness.  Fatigue.  Follow these instructions at home:  Do not eat or drink anything until the numbing medicine (local anesthetic) has worn off and your gag reflex has returned. You will know that the local anesthetic has worn off when you can swallow comfortably.  Do not drive for 24 hours if you received a medicine to help you relax (sedative).  If your health care provider took a tissue sample for testing during the procedure, make sure to get your test results. This is your responsibility. Ask your health care provider or the department performing the test when your results will be ready.  Keep all follow-up visits as told by your health care provider. This is important. Contact a health care provider if:  You cannot stop coughing.  You are not urinating.  You are urinating less than usual. Get help right away if:  You have trouble swallowing.  You cannot eat or drink.  You have throat or chest pain that gets worse.  You are dizzy or light-headed.  You faint.  You have nausea or vomiting.  You have chills.  You have a fever.  You have severe abdominal pain.  You have black, tarry, or bloody stools. This information is not intended to replace advice given to you by your health care provider. Make sure you discuss any questions you have with your health care provider. Document Released: 01/07/2012 Document  Revised: 06/28/2015 Document Reviewed: 12/14/2014 Elsevier Interactive Patient Education  2018 [employer]. No aspirin or NSAIDs for 24 hours. Resume other medications and diet as before. No driving for 24 hours. Upper abdominal ultrasound to be scheduled. Physician will call with biopsy results.     Monitored Anesthesia Care, Care After These instructions provide you with information about caring for yourself after your procedure. Your health care provider may also give you more specific instructions. Your treatment has been planned according to current medical practices, but problems sometimes occur. Call your health care provider if you have any problems or questions after your procedure. What can I expect after the procedure? After your procedure, it is common to:  Feel sleepy for several hours.  Feel clumsy and have poor balance for several hours.  Feel forgetful about what happened after the procedure.  Have poor judgment for several hours.  Feel nauseous or vomit.  Have a sore throat if you had a breathing tube during the procedure.  Follow these instructions at home: For at least 24 hours after the procedure:   Do not: ? Participate in activities in which you could fall or become injured. ? Drive. ? Use heavy machinery. ? Drink alcohol. ? Take sleeping pills or medicines that cause drowsiness. ? Make important decisions or sign legal documents. ? Take care of children on your own.  Rest. Eating and drinking  Follow the diet that is recommended by your health care provider.  If you vomit, drink water, juice, or soup when you can drink without vomiting.  Make  sure you have little or no nausea before eating solid foods. General instructions  Have a responsible adult stay with you until you are awake and alert.  Take over-the-counter and prescription medicines only as told by your health care provider.  If you smoke, do not smoke without  supervision.  Keep all follow-up visits as told by your health care provider. This is important. Contact a health care provider if:  You keep feeling nauseous or you keep vomiting.  You feel light-headed.  You develop a rash.  You have a fever. Get help right away if:  You have trouble breathing. This information is not intended to replace advice given to you by your health care provider. Make sure you discuss any questions you have with your health care provider. Document Released: 05/13/2015 Document Revised: 09/12/2015 Document Reviewed: 05/13/2015 Elsevier Interactive Patient Education  [employer].

## 2017-12-18 NOTE — Op Note (Signed)
[facility] Patient Name: [name] Procedure Date: 12/18/2017 9:33 AM MRN: [id number] Date of Birth: 1960-06-08 Attending MD: Hildred Laser , MD CSN: [id number] Age: 57 Admit Type: Outpatient Procedure:                Upper GI endoscopy Indications:              Epigastric abdominal pain Providers:                Hildred Laser, MD, Lurline Del, RN, Gerome Sam,                            RN, Nelma Rothman, Technician Referring MD:             Arsenio Katz, PA Medicines:                Propofol per Anesthesia Complications:            No immediate complications. Estimated Blood Loss:     Estimated blood loss was minimal. Procedure:                Pre-Anesthesia Assessment:                           - Prior to the procedure, a History and Physical                            was performed, and patient medications and                            allergies were reviewed. The patient's tolerance of                            previous anesthesia was also reviewed. The risks                            and benefits of the procedure and the sedation                            options and risks were discussed with the patient.                            All questions were answered, and informed consent                            was obtained. Prior Anticoagulants: The patient                            last took aspirin 3 days prior to the procedure.                            ASA Grade Assessment: III - A patient with severe                            systemic disease. After reviewing the risks and  benefits, the patient was deemed in satisfactory                            condition to undergo the procedure.                           After obtaining informed consent, the endoscope was                            passed under direct vision. Throughout the                            procedure, the patient's blood pressure, pulse, and   oxygen saturations were monitored continuously. The                            GIF-H190 ([id number]) scope was introduced through the                            and advanced to the second part of duodenum. The                            upper GI endoscopy was accomplished without                            difficulty. The patient tolerated the procedure                            well. Scope In: 9:45:53 AM Scope Out: 9:58:44 AM Total Procedure Duration: 0 hours 12 minutes 51 seconds  Findings:      The proximal esophagus and mid esophagus were normal.      Mucosal changes characterized by {skip}single patch of gastric type       mucosa were found in the distal esophagus. Biopsies were taken with a       cold forceps for histology. The pathology specimen was placed into       Bottle Number 2.      The Z-line was irregular and was found 42 cm from the incisors.      Evidence of a Nissen fundoplication was found in the gastric fundus. The       wrap appeared intact.      Patchy mild inflammation characterized by congestion (edema), erythema       and granularity was found in the gastric antrum. Biopsies were taken       with a cold forceps for histology. The pathology specimen was placed       into Bottle Number 1.      The exam of the stomach was otherwise normal.      The duodenal bulb and second portion of the duodenum were normal. Impression:               - Normal proximal esophagus and mid esophagus.                           - {skip}single patch of gastric type mucosa mucosa  in the esophagus. Biopsied.                           - Z-line irregular, 42 cm from the incisors.                           - A Nissen fundoplication was found. The wrap                            appears intact.                           - Gastritis. Biopsied.                           - Normal duodenal bulb and second portion of the                            duodenum. Moderate  Sedation:      Per Anesthesia Care Recommendation:           - Patient has a contact number available for                            emergencies. The signs and symptoms of potential                            delayed complications were discussed with the                            patient. Return to normal activities tomorrow.                            Written discharge instructions were provided to the                            patient.                           - Resume previous diet today.                           - Continue present medications.                           - No aspirin, ibuprofen, naproxen, or other                            non-steroidal anti-inflammatory drugs for 1 day.                           - Await pathology results.                           - Perform an abdominal ultrasound at appointment to  be scheduled. Procedure Code(s):        --- Professional ---                           [phone], Esophagogastroduodenoscopy, flexible,                            transoral; with biopsy, single or multiple Diagnosis Code(s):        --- Professional ---                           K22.8, Other specified diseases of esophagus                           Z98.890, Other specified postprocedural states                           K29.70, Gastritis, unspecified, without bleeding                           R10.13, Epigastric pain CPT copyright 2018 [employer]. All rights reserved. The codes documented in this report are preliminary and upon coder review may  be revised to meet current compliance requirements. Hildred Laser, MD Hildred Laser, MD 12/18/2017 10:08:07 AM This report has been signed electronically. Number of Addenda: 0

## 2017-12-18 NOTE — Anesthesia Postprocedure Evaluation (Signed)
Anesthesia Post Note  Patient: [name]  Procedure(s) Performed: ESOPHAGOGASTRODUODENOSCOPY (EGD) WITH PROPOFOL (N/A ) BIOPSY  Patient location during evaluation: PACU Anesthesia Type: MAC Level of consciousness: awake and alert and patient cooperative Pain management: satisfactory to patient Vital Signs Assessment: post-procedure vital signs reviewed and stable Respiratory status: spontaneous breathing Cardiovascular status: stable Postop Assessment: no apparent nausea or vomiting Anesthetic complications: no     Last Vitals:  Vitals:   12/18/17 1025 12/18/17 1028  BP:  102/64  Pulse: 69 63  Resp: (!) 22 15  Temp:    SpO2: 94% 97%    Last Pain:  Vitals:   12/18/17 1008  TempSrc:   PainSc: 0-No pain                 Ahlana Slaydon

## 2017-12-18 NOTE — Anesthesia Procedure Notes (Signed)
Procedure Name: MAC Date/Time: 12/18/2017 9:40 AM Performed by: Vista Deck, CRNA Pre-anesthesia Checklist: Patient identified, Emergency Drugs available, Suction available, Timeout performed and Patient being monitored Patient Re-evaluated:Patient Re-evaluated prior to induction Oxygen Delivery Method: Nasal Cannula

## 2017-12-18 NOTE — Anesthesia Preprocedure Evaluation (Signed)
Anesthesia Evaluation    Airway Mallampati: II       Dental  (+) Poor Dentition   Pulmonary former smoker,    breath sounds clear to auscultation       Cardiovascular hypertension, On Medications + Peripheral Vascular Disease   Rhythm:regular     Neuro/Psych  Headaches, Seizures -,   Neuromuscular disease CVA    GI/Hepatic   Endo/Other  diabetes, Type 2  Renal/GU ARFRenal disease     Musculoskeletal   Abdominal   Peds  Hematology   Anesthesia Other Findings Obesity DM w/neuropathy and gastroparesis CVA 8/16 denies sequellae GERD s/p Nisen  Reproductive/Obstetrics                             Anesthesia Physical Anesthesia Plan  ASA: III  Anesthesia Plan: MAC   Post-op Pain Management:    Induction:   PONV Risk Score and Plan:   Airway Management Planned:   Additional Equipment:   Intra-op Plan:   Post-operative Plan:   Informed Consent:   Plan Discussed with: Anesthesiologist  Anesthesia Plan Comments:         Anesthesia Quick Evaluation

## 2017-12-18 NOTE — Telephone Encounter (Signed)
Lisa from Endo call . Dr.Rehman wants the patient to have a [place] URQ , next week.

## 2017-12-18 NOTE — H&P (Signed)
[name] is an 57 y.o. male.   Chief Complaint: Patient is here for EGD. HPI: Patient is 57 year old Caucasian male who presented to emergency room about 7 weeks ago with postprandial epigastric pain.  Lab studies and CT were unremarkable.  He continues to experience pain usually triggered with meals.  He has not had nausea or vomiting.  He says he rarely has heartburn.  He is on low-dose pantoprazole and now on sucralfate. He denies melena or rectal bleeding or abdominal pain. He does not smoke cigarettes or drink alcohol. He is on low-dose aspirin but does not take other OTC NSAIDs.  Past Medical History:  Diagnosis Date  . Allergic rhinitis, cause unspecified   . Cervicalgia   . Chest pain Sept. 2005   multiple caths  /  cath 06/15/2010..normal coronaries,  EF 60%   (false postive nuclear in the past)  . Dyslipidemia    mixed  . Esophageal reflux status post antireflux surgery.   . Fibromyalgia   .    .    . History of kidney stones   . Hyperlipidemia   . Hypertension   . IDDM (insulin dependent diabetes mellitus) ([place])   . Lumbago   . Neuropathy associated with endocrine disorder ([place])   . Overweight(278.02)   . Peripheral vascular disease ([place])   . Pulmonary embolus ([facility])    Hx of small left lower lobe  pulmonary embolus  . Pulmonary embolus ([place]) 2010ish  . Stroke [facility])    mini stroke aug 2016  . Type II or unspecified type diabetes mellitus with neurological manifestations, not stated as uncontrolled(250.60)   . Urticaria, unspecified     Past Surgical History:  Procedure Laterality Date  . ACHILLES TENDON REPAIR Left 2013  . AMPUTATION Left 06/02/2013   Procedure: AMPUTATION 1ST TOE LEFT FOOT;  Surgeon: Marcheta Grammes, DPM;  Location: [facility];  Service: Orthopedics;  Laterality: Left;  . AMPUTATION Left 07/21/2013   Procedure: PARTIAL AMPUTATION 2ND TOE LEFT FOOT;  Surgeon: Marcheta Grammes, DPM;  Location: [facility];  Service: Podiatry;  Laterality:  Left;  . ANTERIOR CERVICAL DECOMP/DISCECTOMY FUSION N/A 12/29/2016   Procedure: Cervical five-six Cervical six-seven Anterior cervical discectomy with fusion and plate fixation;  Surgeon: Ditty, Kevan Ny, MD;  Location: [place];  Service: Neurosurgery;  Laterality: N/A;  . APPENDECTOMY    . BACK SURGERY  1999   x3  . FOOT ARTHRODESIS Right 01/11/2014   Procedure: ARTHRODESIS INTERPHALANGEAL JOINT HALLUX RIGHT FOOT;  Surgeon: Marcheta Grammes, DPM;  Location: [facility];  Service: Podiatry;  Laterality: Right;  . HARDWARE REMOVAL Right 01/17/2015   Procedure: HARDWARE REMOVAL;  Surgeon: Caprice Beaver, DPM;  Location: [facility];  Service: Podiatry;  Laterality: Right;  . KNEE ARTHROSCOPY Right 10/2015  . LUMBAR LAMINECTOMY/DECOMPRESSION MICRODISCECTOMY Right 01/12/2013   Procedure: Right Lumbar Three-Four Laminotomy/Foraminotomy;  Surgeon: Floyce Stakes, MD;  Location: [facility];  Service: Neurosurgery;  Laterality: Right;  Right Lumbar Three-Four Laminotomy/Foraminotomy  . NISSEN FUNDOPLICATION    . SHOULDER ARTHROSCOPY W/ ROTATOR CUFF REPAIR Right     Family History  Problem Relation Age of Onset  . Heart attack Father 8  . Hyperlipidemia Father   . Hypertension Father   . Heart attack Mother 45  . Diabetes Mother   . Hypertension Mother   . Hyperlipidemia Mother   . Healthy Brother   . Seizures Brother   . Migraines Neg Hx    Social History:  reports that  he quit smoking about 35 years ago. His smoking use included cigarettes. He started smoking about 40 years ago. He has a 15.00 pack-year smoking history. He has never used smokeless tobacco. He reports that he does not drink alcohol or use drugs.  Allergies:  Allergies  Allergen Reactions  . Reglan [Metoclopramide] Itching and Rash    Medications Prior to Admission  Medication Sig Dispense Refill  . aspirin EC 81 MG tablet Take 81 mg by mouth daily.    [name] atorvastatin (LIPITOR) 40 MG tablet Take 1 tablet (40 mg  total) by mouth daily. 30 tablet 0  . gabapentin (NEURONTIN) 600 MG tablet TAKE 4 TABLETS BY MOUTH AT BEDTIME. (Patient taking differently: Take 600-1,800 mg by mouth See admin instructions. Take 600mg  by mouth in the morning and 1800mg  at bedtime) 120 tablet 3  . LEVEMIR 100 UNIT/ML injection INJECT 60 UNITS INTO THE SKIN AT BEDTIME (Patient taking differently: Inject 60 Units into the skin at bedtime. ) 20 mL 2  . metFORMIN (GLUCOPHAGE) 500 MG tablet Take 1 tablet (500 mg total) by mouth 2 (two) times daily with a meal. 2 pills 2 times a day 60 tablet 2  . RANEXA 1000 MG SR tablet TAKE 1 TABLET BY MOUTH DAILY (Patient taking differently: Take 1,000 mg by mouth every morning. ) 30 tablet 6  . HYDROcodone-acetaminophen (NORCO/VICODIN) 5-325 MG tablet Take 1 tablet by mouth every 4 (four) hours as needed. (Patient not taking: Reported on 12/08/2017) 10 tablet 0  . ondansetron (ZOFRAN ODT) 4 MG disintegrating tablet Take 1 tablet (4 mg total) by mouth every 8 (eight) hours as needed. (Patient not taking: Reported on 12/08/2017) 10 tablet 0  . pantoprazole (PROTONIX) 20 MG tablet Take 1 tablet (20 mg total) by mouth daily. (Patient not taking: Reported on 12/08/2017) 30 tablet 0  . sucralfate (CARAFATE) 1 GM/10ML suspension Take 10 mLs (1 g total) by mouth 4 (four) times daily. (Patient not taking: Reported on 12/08/2017) 420 mL 1    Results for orders placed or performed during the hospital encounter of 12/18/17 (from the past 48 hour(s))  Glucose, capillary     Status: Abnormal   Collection Time: 12/18/17  8:15 AM  Result Value Ref Range   Glucose-Capillary 192 (H) 70 - 99 mg/dL   No results found.  ROS  Blood pressure (!) 149/96, pulse 64, temperature 97.6 F (36.4 C), temperature source Oral, height 6\' 5"  (1.956 m), weight 123.8 kg, SpO2 97 %. Physical Exam  Constitutional: He appears well-developed and well-nourished.  HENT:  Mouth/Throat: Oropharynx is clear and moist.  Eyes: Conjunctivae  are normal. No scleral icterus.  Neck: No thyromegaly present.  Cardiovascular: Normal rate, regular rhythm and normal heart sounds.  No murmur heard. Respiratory: Effort normal and breath sounds normal.  GI:  Abdomen is symmetrical with laparoscopy scars across upper abdomen.  Abdomen is soft with mild midepigastric tenderness.  No organomegaly or masses.  Musculoskeletal: He exhibits no edema.  Lymphadenopathy:    He has no cervical adenopathy.  Neurological: He is alert.  Skin: Skin is warm and dry.     Assessment/Plan Epigastric pain. Diagnostic EGD.  Hildred Laser, MD 12/18/2017, 9:35 AM

## 2017-12-18 NOTE — Transfer of Care (Signed)
Immediate Anesthesia Transfer of Care Note  Patient: [name]  Procedure(s) Performed: ESOPHAGOGASTRODUODENOSCOPY (EGD) WITH PROPOFOL (N/A ) BIOPSY  Patient Location: PACU  Anesthesia Type:MAC  Level of Consciousness: awake and patient cooperative  Airway & Oxygen Therapy: Patient Spontanous Breathing  Post-op Assessment: Report given to RN and Post -op Vital signs reviewed and stable  Post vital signs: Reviewed and stable  Last Vitals:  Vitals Value Taken Time  BP    Temp    Pulse    Resp    SpO2      Last Pain:  Vitals:   12/18/17 0940  TempSrc:   PainSc: 0-No pain         Complications: No apparent anesthesia complications

## 2017-12-25 ENCOUNTER — Ambulatory Visit (HOSPITAL_COMMUNITY)
Admission: RE | Admit: 2017-12-25 | Discharge: 2017-12-25 | Disposition: A | Discharge status: Home / Self Care | Payer: Medicare HMO | Source: Ambulatory Visit | Attending: Internal Medicine | Admitting: Internal Medicine

## 2017-12-25 ENCOUNTER — Other Ambulatory Visit (INDEPENDENT_AMBULATORY_CARE_PROVIDER_SITE_OTHER): Payer: Self-pay | Admitting: Internal Medicine

## 2017-12-25 ENCOUNTER — Other Ambulatory Visit (HOSPITAL_COMMUNITY): Payer: Self-pay | Admitting: Nephrology

## 2017-12-25 DIAGNOSIS — R1013 Epigastric pain: Secondary | ICD-10-CM | POA: Diagnosis not present

## 2017-12-25 DIAGNOSIS — N183 Chronic kidney disease, stage 3 unspecified: Secondary | ICD-10-CM

## 2017-12-25 DIAGNOSIS — E559 Vitamin D deficiency, unspecified: Secondary | ICD-10-CM | POA: Diagnosis not present

## 2017-12-25 DIAGNOSIS — I129 Hypertensive chronic kidney disease with stage 1 through stage 4 chronic kidney disease, or unspecified chronic kidney disease: Secondary | ICD-10-CM | POA: Diagnosis not present

## 2017-12-25 DIAGNOSIS — R809 Proteinuria, unspecified: Secondary | ICD-10-CM | POA: Diagnosis not present

## 2017-12-25 DIAGNOSIS — Z9889 Other specified postprocedural states: Secondary | ICD-10-CM | POA: Diagnosis not present

## 2017-12-25 DIAGNOSIS — Z1159 Encounter for screening for other viral diseases: Secondary | ICD-10-CM | POA: Diagnosis not present

## 2017-12-25 DIAGNOSIS — E1151 Type 2 diabetes mellitus with diabetic peripheral angiopathy without gangrene: Secondary | ICD-10-CM | POA: Diagnosis not present

## 2017-12-25 DIAGNOSIS — K3189 Other diseases of stomach and duodenum: Secondary | ICD-10-CM | POA: Diagnosis not present

## 2017-12-25 DIAGNOSIS — K209 Esophagitis, unspecified: Secondary | ICD-10-CM | POA: Diagnosis not present

## 2017-12-25 DIAGNOSIS — Z89422 Acquired absence of other left toe(s): Secondary | ICD-10-CM | POA: Diagnosis not present

## 2017-12-25 DIAGNOSIS — D509 Iron deficiency anemia, unspecified: Secondary | ICD-10-CM | POA: Diagnosis not present

## 2017-12-25 DIAGNOSIS — Z79899 Other long term (current) drug therapy: Secondary | ICD-10-CM | POA: Diagnosis not present

## 2017-12-25 DIAGNOSIS — K2950 Unspecified chronic gastritis without bleeding: Secondary | ICD-10-CM | POA: Diagnosis not present

## 2017-12-25 DIAGNOSIS — I10 Essential (primary) hypertension: Secondary | ICD-10-CM | POA: Diagnosis not present

## 2017-12-25 DIAGNOSIS — Z8673 Personal history of transient ischemic attack (TIA), and cerebral infarction without residual deficits: Secondary | ICD-10-CM | POA: Diagnosis not present

## 2017-12-25 DIAGNOSIS — Z86711 Personal history of pulmonary embolism: Secondary | ICD-10-CM | POA: Diagnosis not present

## 2017-12-25 MED ORDER — DICYCLOMINE HCL 10 MG PO CAPS: 10.0000 mg | ORAL_CAPSULE | Freq: Three times a day (TID) | ORAL | 1 refills | Status: DC | PRN

## 2017-12-28 ENCOUNTER — Ambulatory Visit (HOSPITAL_COMMUNITY)
Admission: RE | Admit: 2017-12-28 | Discharge: 2017-12-28 | Disposition: A | Discharge status: Home / Self Care | Payer: Medicare HMO | Source: Ambulatory Visit | Attending: Surgery | Admitting: Surgery

## 2017-12-28 DIAGNOSIS — N183 Chronic kidney disease, stage 3 unspecified: Secondary | ICD-10-CM

## 2017-12-29 ENCOUNTER — Encounter (HOSPITAL_COMMUNITY): Payer: Self-pay | Admitting: Internal Medicine

## 2018-01-19 DIAGNOSIS — I10 Essential (primary) hypertension: Secondary | ICD-10-CM | POA: Diagnosis not present

## 2018-01-19 DIAGNOSIS — N183 Chronic kidney disease, stage 3 (moderate): Secondary | ICD-10-CM | POA: Diagnosis not present

## 2018-01-19 DIAGNOSIS — E1121 Type 2 diabetes mellitus with diabetic nephropathy: Secondary | ICD-10-CM | POA: Diagnosis not present

## 2018-02-10 DIAGNOSIS — Z6833 Body mass index (BMI) 33.0-33.9, adult: Secondary | ICD-10-CM | POA: Diagnosis not present

## 2018-02-10 DIAGNOSIS — E1165 Type 2 diabetes mellitus with hyperglycemia: Secondary | ICD-10-CM | POA: Diagnosis not present

## 2018-02-10 DIAGNOSIS — R69 Illness, unspecified: Secondary | ICD-10-CM | POA: Diagnosis not present

## 2018-02-10 DIAGNOSIS — Z299 Encounter for prophylactic measures, unspecified: Secondary | ICD-10-CM | POA: Diagnosis not present

## 2018-02-10 DIAGNOSIS — E785 Hyperlipidemia, unspecified: Secondary | ICD-10-CM | POA: Diagnosis not present

## 2018-02-23 ENCOUNTER — Other Ambulatory Visit: Payer: Self-pay | Admitting: Cardiovascular Disease

## 2018-03-10 DIAGNOSIS — E1140 Type 2 diabetes mellitus with diabetic neuropathy, unspecified: Secondary | ICD-10-CM | POA: Diagnosis not present

## 2018-03-10 DIAGNOSIS — Z6833 Body mass index (BMI) 33.0-33.9, adult: Secondary | ICD-10-CM | POA: Diagnosis not present

## 2018-03-10 DIAGNOSIS — Z789 Other specified health status: Secondary | ICD-10-CM | POA: Diagnosis not present

## 2018-03-10 DIAGNOSIS — E785 Hyperlipidemia, unspecified: Secondary | ICD-10-CM | POA: Diagnosis not present

## 2018-03-10 DIAGNOSIS — Z299 Encounter for prophylactic measures, unspecified: Secondary | ICD-10-CM | POA: Diagnosis not present

## 2018-03-30 DIAGNOSIS — I129 Hypertensive chronic kidney disease with stage 1 through stage 4 chronic kidney disease, or unspecified chronic kidney disease: Secondary | ICD-10-CM | POA: Diagnosis not present

## 2018-03-30 DIAGNOSIS — E559 Vitamin D deficiency, unspecified: Secondary | ICD-10-CM | POA: Diagnosis not present

## 2018-03-30 DIAGNOSIS — R809 Proteinuria, unspecified: Secondary | ICD-10-CM | POA: Diagnosis not present

## 2018-03-30 DIAGNOSIS — N183 Chronic kidney disease, stage 3 (moderate): Secondary | ICD-10-CM | POA: Diagnosis not present

## 2018-03-30 DIAGNOSIS — D509 Iron deficiency anemia, unspecified: Secondary | ICD-10-CM | POA: Diagnosis not present

## 2018-03-30 DIAGNOSIS — Z79899 Other long term (current) drug therapy: Secondary | ICD-10-CM | POA: Diagnosis not present

## 2018-03-31 DIAGNOSIS — E1165 Type 2 diabetes mellitus with hyperglycemia: Secondary | ICD-10-CM | POA: Diagnosis not present

## 2018-03-31 DIAGNOSIS — Z299 Encounter for prophylactic measures, unspecified: Secondary | ICD-10-CM | POA: Diagnosis not present

## 2018-03-31 DIAGNOSIS — Z6833 Body mass index (BMI) 33.0-33.9, adult: Secondary | ICD-10-CM | POA: Diagnosis not present

## 2018-03-31 DIAGNOSIS — M19011 Primary osteoarthritis, right shoulder: Secondary | ICD-10-CM | POA: Diagnosis not present

## 2018-03-31 DIAGNOSIS — M25511 Pain in right shoulder: Secondary | ICD-10-CM | POA: Diagnosis not present

## 2018-04-02 ENCOUNTER — Ambulatory Visit (INDEPENDENT_AMBULATORY_CARE_PROVIDER_SITE_OTHER): Payer: Medicare HMO | Admitting: Cardiovascular Disease

## 2018-04-02 ENCOUNTER — Encounter: Payer: Self-pay | Admitting: Cardiovascular Disease

## 2018-04-02 VITALS — BP 118/82 | HR 96 | Ht 77.0 in | Wt 268.0 lb

## 2018-04-02 DIAGNOSIS — E785 Hyperlipidemia, unspecified: Secondary | ICD-10-CM

## 2018-04-02 DIAGNOSIS — I10 Essential (primary) hypertension: Secondary | ICD-10-CM | POA: Diagnosis not present

## 2018-04-02 DIAGNOSIS — R079 Chest pain, unspecified: Secondary | ICD-10-CM

## 2018-04-02 DIAGNOSIS — Z8673 Personal history of transient ischemic attack (TIA), and cerebral infarction without residual deficits: Secondary | ICD-10-CM

## 2018-04-02 NOTE — Progress Notes (Signed)
SUBJECTIVE: The patient presents for routine follow-up.  I last saw him in March 2018.  He has a history of microvascular angina.  He has had several abnormal stress test with catheterizations in 2005 and 2012 showing normal coronaries.  He also has a history of TIA, hypertension, hyperlipidemia, and insulin-dependent diabetes mellitus.  The patient denies any symptoms of chest pain, palpitations, shortness of breath, lightheadedness, dizziness, leg swelling, orthopnea, PND, and syncope.  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    Review of Systems: As per "subjective", otherwise negative.  Allergies  Allergen Reactions  . Reglan [Metoclopramide] Itching and Rash    Current Outpatient Medications  Medication Sig Dispense Refill  . aspirin EC 81 MG tablet Take 1 tablet (81 mg total) by mouth daily.    [name] atorvastatin (LIPITOR) 40 MG tablet Take 1 tablet (40 mg total) by mouth daily. 30 tablet 0  . dicyclomine (BENTYL) 10 MG capsule Take 1 capsule (10 mg total) by mouth 3 (three) times daily as needed for spasms. 60 capsule 1  . gabapentin (NEURONTIN) 600 MG tablet TAKE 4 TABLETS BY MOUTH AT BEDTIME. (Patient taking differently: Take 600-1,800 mg by mouth See admin instructions. Take 600mg  by mouth in the morning and 1800mg  at bedtime) 120 tablet 3  . LEVEMIR 100 UNIT/ML injection INJECT 60 UNITS INTO THE SKIN AT BEDTIME (Patient taking differently: Inject 60 Units into the skin at bedtime. ) 20 mL 2  . metFORMIN (GLUCOPHAGE) 500 MG tablet Take 1 tablet (500 mg total) by mouth 2 (two) times daily with a meal. 2 pills 2 times a day 60 tablet 2  . omega-3 acid ethyl esters (LOVAZA) 1 g capsule Take 1 capsule by mouth daily.    [name] omeprazole (PRILOSEC) 20 MG capsule Take 20 mg by mouth daily.     . ranolazine (RANEXA) 1000 MG SR tablet TAKE 1 TABLET BY MOUTH DAILY 30 tablet 3   No current facility-administered medications for this visit.     Past  Medical History:  Diagnosis Date  . Allergic rhinitis, cause unspecified   . Cervicalgia   . Chest pain Sept. 2005   multiple caths  /  cath 06/15/2010..normal coronaries,  EF 60%   (false postive nuclear in the past)  . Dyslipidemia    mixed  . Esophageal reflux   . Fibromyalgia   . Gastroparesis   . Headache(784.0)   . History of kidney stones   . Hyperlipidemia   . Hypertension   . IDDM (insulin dependent diabetes mellitus) ([place])   . Lumbago   . Neuropathy associated with endocrine disorder ([place])   . Overweight(278.02)   . Peripheral vascular disease ([place])   . Pulmonary embolus (HCC)    Hx of small left lower lobe  pulmonary embolus  . Pulmonary embolus ([place]) 2010ish  . Stroke [facility])    mini stroke aug 2016  . Type II or unspecified type diabetes mellitus with neurological manifestations, not stated as uncontrolled(250.60)   . Urticaria, unspecified     Past Surgical History:  Procedure Laterality Date  . ACHILLES TENDON REPAIR Left 2013  . AMPUTATION Left 06/02/2013   Procedure: AMPUTATION 1ST TOE LEFT FOOT;  Surgeon: Marcheta Grammes, DPM;  [facility];  Service: Orthopedics;  Laterality: Left;  . AMPUTATION Left 07/21/2013   Procedure: PARTIAL AMPUTATION 2ND TOE LEFT FOOT;  Surgeon: Marcheta Grammes, DPM;  Location: [facility];  Service: Podiatry;  Laterality: Left;  .  ANTERIOR CERVICAL DECOMP/DISCECTOMY FUSION N/A 12/29/2016   Procedure: Cervical five-six Cervical six-seven Anterior cervical discectomy with fusion and plate fixation;  Surgeon: Ditty, Kevan Ny, MD;  Location: [place];  Service: Neurosurgery;  Laterality: N/A;  . APPENDECTOMY    . BACK SURGERY  1999   x3  . BIOPSY  12/18/2017   Procedure: BIOPSY;  Surgeon: Rogene Houston, MD;  [facility];  Service: Endoscopy;;  gastric and esophagus  . ESOPHAGOGASTRODUODENOSCOPY (EGD) WITH PROPOFOL N/A 12/18/2017   Procedure: ESOPHAGOGASTRODUODENOSCOPY (EGD) WITH PROPOFOL;  Surgeon: Rogene Houston, MD;  [facility];  Service: Endoscopy;  Laterality: N/A;  9:25  . FOOT ARTHRODESIS Right 01/11/2014   Procedure: ARTHRODESIS INTERPHALANGEAL JOINT HALLUX RIGHT FOOT;  Surgeon: Marcheta Grammes, DPM;  Location: [facility];  Service: Podiatry;  Laterality: Right;  . HARDWARE REMOVAL Right 01/17/2015   Procedure: HARDWARE REMOVAL;  Surgeon: Caprice Beaver, DPM;  Location: [facility];  Service: Podiatry;  Laterality: Right;  . KNEE ARTHROSCOPY Right 10/2015  . LUMBAR LAMINECTOMY/DECOMPRESSION MICRODISCECTOMY Right 01/12/2013   Procedure: Right Lumbar Three-Four Laminotomy/Foraminotomy;  Surgeon: Floyce Stakes, MD;  [facility];  Service: Neurosurgery;  Laterality: Right;  Right Lumbar Three-Four Laminotomy/Foraminotomy  . NISSEN FUNDOPLICATION    . SHOULDER ARTHROSCOPY W/ ROTATOR CUFF REPAIR Right     Social History   Socioeconomic History  . Marital status: Married    Spouse name: Not on file  . Number of children: 2  . Years of education: 10th  . Highest education level: Not on file  Occupational History  . Occupation: Retired- Research officer, trade union  Social Needs  . Financial resource strain: Not on file  . Food insecurity:    Worry: Not on file    Inability: Not on file  . Transportation needs:    Medical: Not on file    Non-medical: Not on file  Tobacco Use  . Smoking status: Former Smoker    Packs/day: 3.00    Years: 5.00    Pack years: 15.00    Types: Cigarettes    Start date: 06/20/1977    Last attempt to quit: 07/20/1982    Years since quitting: 35.7  . Smokeless tobacco: Never Used  . Tobacco comment: quit 1980's  Substance and Sexual Activity  . Alcohol use: No    Alcohol/week: 0.0 standard drinks  . Drug use: No  . Sexual activity: Yes    Birth control/protection: None  Lifestyle  . Physical activity:    Days per week: Not on file    Minutes per session: Not on file  . Stress: Not on file  Relationships  . Social connections:     Talks on phone: Not on file    Gets together: Not on file    Attends religious service: Not on file    Active member of club or organization: Not on file    Attends meetings of clubs or organizations: Not on file    Relationship status: Not on file  . Intimate partner violence:    Fear of current or ex partner: Not on file    Emotionally abused: Not on file    Physically abused: Not on file    Forced sexual activity: Not on file  Other Topics Concern  . Not on file  Social History Narrative  . Not on file     Vitals:   04/02/18 1103  BP: 118/82  Pulse: 96  SpO2: 98%  Weight: 268 lb (121.6  kg)  Height: 6\' 5"  (1.956 m)    Wt Readings from Last 3 Encounters:  04/02/18 268 lb (121.6 kg)  12/18/17 273 lb (123.8 kg)  11/11/17 252 lb (114.3 kg)     PHYSICAL EXAM General: NAD HEENT: Normal. Neck: No JVD, no thyromegaly. Lungs: Clear to auscultation bilaterally with normal respiratory effort. CV: Regular rate and rhythm, normal S1/S2, no S3/S4, no murmur. No pretibial or periankle edema.  No carotid bruit.   Abdomen: Soft, nontender, no distention.  Neurologic: Alert and oriented.  Psych: Normal affect. Skin: Normal. Musculoskeletal: No gross deformities.    ECG: Reviewed above under Subjective   Labs: Lab Results  Component Value Date/Time   K 4.8 10/30/2017 04:11 PM   BUN 36 (H) 10/30/2017 04:11 PM   CREATININE 2.10 (H) 10/30/2017 04:11 PM   CREATININE 1.58 (H) 09/27/2015 09:34 AM   ALT 24 10/30/2017 04:11 PM   HGB 13.6 10/30/2017 04:11 PM     Lipids: Lab Results  Component Value Date/Time   LDLCALC 96 03/14/2017 04:58 PM   CHOL 151 03/14/2017 04:58 PM   TRIG 111 03/14/2017 04:58 PM   HDL 33 (L) 03/14/2017 04:58 PM     Additional studies/ records that were reviewed today include:   Echocardiogram: 03/2017 Study Conclusions  - Left ventricle: Inferobasal hypokinesis The cavity size was normal. There was mild focal basal hypertrophy of the  septum. Systolic function was normal. The estimated ejection fraction was in the range of 55% to 60%. Wall motion was normal; there were no regional wall motion abnormalities. Doppler parameters are consistent with abnormal left ventricular relaxation (grade 1 diastolic dysfunction). - Mitral valve: Valve area by pressure half-time: 1.55 cm^2. - Left atrium: The atrium was mildly dilated.  Cardiac Catheterization: 06/2010 ANGIOGRAPHIC DATA: The right coronary artery arises somewhat anteriorly. It is a small nondominant vessel and is normal.  The left main coronary artery is normal.  The left anterior descending artery is a large vessel and is normal.  There is a moderate ramus intermediate branch which is normal.  Left circumflex coronary artery is a dominant vessel. There is minor narrowing of the third obtuse marginal vessel up to 40%. Otherwise, the left circumflex system appears normal.  Left ventricular angiography performed in RAO view demonstrates normal left ventricular size and contractility with ejection fraction estimated at 60%.  FINAL INTERPRETATION: 1. No significant obstructive atherosclerotic coronary artery disease. 2. Normal left ventricular function.   ASSESSMENT AND PLAN: 1.  Chest pain: Normal coronaries in 2005 and 2012.  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  This can be prescribed in the future by his PCP.  2.  Hypertension: Controlled on present therapy.  No changes.  3.  Hyperlipidemia: Continue atorvastatin.  4.  History of TIA: He takes aspirin and statin therapy.    Disposition: Follow up as needed   Kate Sable, M.D., F.A.C.C.

## 2018-04-02 NOTE — Patient Instructions (Signed)
Medication Instructions:  Your physician recommends that you continue on your current medications as directed. Please refer to the Current Medication list given to you today.  If you need a refill on your cardiac medications before your next appointment, please call your pharmacy.   Lab work: NONE  If you have labs (blood work) drawn today and your tests are completely normal, you will receive your results only by: [name] MyChart Message (if you have MyChart) OR . A paper copy in the mail If you have any lab test that is abnormal or we need to change your treatment, we will call you to review the results.  Testing/Procedures: NONE   Follow-Up: At [facility], you and your health needs are our priority.  As part of our continuing mission to provide you with exceptional heart care, we have created designated Provider Care Teams.  These Care Teams include your primary Cardiologist (physician) and Advanced Practice Providers (APPs -  Physician Assistants and Nurse Practitioners) who all work together to provide you with the care you need, when you need it. You will need a follow up appointment as needed .  Please call our office 2 months in advance to schedule this appointment.  You may see Kate Sable, MD or one of the following Advanced Practice Providers on your designated Care Team:   Bernerd Pho, PA-C [facility]) . Ermalinda Barrios, PA-C ([place])  Any Other Special Instructions Will Be Listed Below (If Applicable). Thank you for choosing [facility]!

## 2018-04-06 ENCOUNTER — Emergency Department (HOSPITAL_COMMUNITY): Payer: Medicare HMO

## 2018-04-06 ENCOUNTER — Other Ambulatory Visit: Payer: Self-pay

## 2018-04-06 ENCOUNTER — Encounter (HOSPITAL_COMMUNITY): Payer: Self-pay | Admitting: *Deleted

## 2018-04-06 ENCOUNTER — Emergency Department (HOSPITAL_COMMUNITY)
Admission: EM | Admit: 2018-04-06 | Discharge: 2018-04-06 | Disposition: A | Discharge status: Home / Self Care | Payer: Medicare HMO | Attending: Emergency Medicine | Admitting: Emergency Medicine

## 2018-04-06 DIAGNOSIS — R112 Nausea with vomiting, unspecified: Secondary | ICD-10-CM | POA: Diagnosis not present

## 2018-04-06 DIAGNOSIS — I10 Essential (primary) hypertension: Secondary | ICD-10-CM | POA: Insufficient documentation

## 2018-04-06 DIAGNOSIS — Z79899 Other long term (current) drug therapy: Secondary | ICD-10-CM | POA: Diagnosis not present

## 2018-04-06 DIAGNOSIS — E119 Type 2 diabetes mellitus without complications: Secondary | ICD-10-CM | POA: Insufficient documentation

## 2018-04-06 DIAGNOSIS — R197 Diarrhea, unspecified: Secondary | ICD-10-CM | POA: Diagnosis not present

## 2018-04-06 DIAGNOSIS — R1084 Generalized abdominal pain: Secondary | ICD-10-CM

## 2018-04-06 DIAGNOSIS — Z87891 Personal history of nicotine dependence: Secondary | ICD-10-CM | POA: Insufficient documentation

## 2018-04-06 DIAGNOSIS — Z7982 Long term (current) use of aspirin: Secondary | ICD-10-CM | POA: Insufficient documentation

## 2018-04-06 DIAGNOSIS — M79604 Pain in right leg: Secondary | ICD-10-CM

## 2018-04-06 DIAGNOSIS — R000 Tachycardia, unspecified: Secondary | ICD-10-CM | POA: Diagnosis not present

## 2018-04-06 DIAGNOSIS — M79605 Pain in left leg: Secondary | ICD-10-CM | POA: Diagnosis not present

## 2018-04-06 DIAGNOSIS — Z7984 Long term (current) use of oral hypoglycemic drugs: Secondary | ICD-10-CM | POA: Insufficient documentation

## 2018-04-06 DIAGNOSIS — M79661 Pain in right lower leg: Secondary | ICD-10-CM | POA: Diagnosis not present

## 2018-04-06 DIAGNOSIS — M79662 Pain in left lower leg: Secondary | ICD-10-CM | POA: Diagnosis not present

## 2018-04-06 DIAGNOSIS — R1110 Vomiting, unspecified: Secondary | ICD-10-CM | POA: Diagnosis not present

## 2018-04-06 LAB — CBC WITH DIFFERENTIAL/PLATELET
ABS IMMATURE GRANULOCYTES: 0.06 10*3/uL (ref 0.00–0.07)
BASOS ABS: 0 10*3/uL (ref 0.0–0.1)
BASOS PCT: 0 %
EOS ABS: 0 10*3/uL (ref 0.0–0.5)
Eosinophils Relative: 0 %
HEMATOCRIT: 45 % (ref 39.0–52.0)
Hemoglobin: 14.9 g/dL (ref 13.0–17.0)
IMMATURE GRANULOCYTES: 1 %
LYMPHS ABS: 0.3 10*3/uL — AB (ref 0.7–4.0)
Lymphocytes Relative: 3 %
MCH: 28 pg (ref 26.0–34.0)
MCHC: 33.1 g/dL (ref 30.0–36.0)
MCV: 84.6 fL (ref 80.0–100.0)
Monocytes Absolute: 0.3 10*3/uL (ref 0.1–1.0)
Monocytes Relative: 3 %
NEUTROS ABS: 11.1 10*3/uL — AB (ref 1.7–7.7)
NEUTROS PCT: 93 %
NRBC: 0 % (ref 0.0–0.2)
PLATELETS: 162 10*3/uL (ref 150–400)
RBC: 5.32 MIL/uL (ref 4.22–5.81)
RDW: 13.4 % (ref 11.5–15.5)
WBC: 11.9 10*3/uL — ABNORMAL HIGH (ref 4.0–10.5)

## 2018-04-06 LAB — COMPREHENSIVE METABOLIC PANEL
ALBUMIN: 4 g/dL (ref 3.5–5.0)
ALT: 23 U/L (ref 0–44)
AST: 19 U/L (ref 15–41)
Alkaline Phosphatase: 85 U/L (ref 38–126)
Anion gap: 14 (ref 5–15)
BUN: 42 mg/dL — AB (ref 6–20)
CALCIUM: 9 mg/dL (ref 8.9–10.3)
CO2: 17 mmol/L — ABNORMAL LOW (ref 22–32)
CREATININE: 1.96 mg/dL — AB (ref 0.61–1.24)
Chloride: 104 mmol/L (ref 98–111)
GFR calc Af Amer: 42 mL/min — ABNORMAL LOW (ref >60)
GFR, EST NON AFRICAN AMERICAN: 37 mL/min — AB (ref >60)
GLUCOSE: 345 mg/dL — AB (ref 70–99)
Potassium: 4.8 mmol/L (ref 3.5–5.1)
Sodium: 135 mmol/L (ref 135–145)
Total Bilirubin: 0.7 mg/dL (ref 0.3–1.2)
Total Protein: 7.6 g/dL (ref 6.5–8.1)

## 2018-04-06 LAB — URINALYSIS, ROUTINE W REFLEX MICROSCOPIC
BACTERIA UA: NONE SEEN
BILIRUBIN URINE: NEGATIVE
Glucose, UA: >=500 mg/dL — AB
Hgb urine dipstick: NEGATIVE
Ketones, ur: 5 mg/dL — AB
Leukocytes,Ua: NEGATIVE
Nitrite: NEGATIVE
PH: 5 (ref 5.0–8.0)
Protein, ur: NEGATIVE mg/dL
SPECIFIC GRAVITY, URINE: 1.024 (ref 1.005–1.030)

## 2018-04-06 LAB — LIPASE, BLOOD: Lipase: 36 U/L (ref 11–51)

## 2018-04-06 MED ORDER — ONDANSETRON HCL 4 MG/2ML IJ SOLN
4.0000 mg | Freq: Once | INTRAMUSCULAR | Status: AC
  Administered 2018-04-06: 4 mg via INTRAVENOUS
  Filled 2018-04-06: qty 2

## 2018-04-06 MED ORDER — IOHEXOL 300 MG/ML  SOLN: 100.0000 mL | Freq: Once | INTRAMUSCULAR | Status: DC | PRN

## 2018-04-06 MED ORDER — MORPHINE SULFATE (PF) 4 MG/ML IV SOLN
4.0000 mg | Freq: Once | INTRAVENOUS | Status: AC
  Administered 2018-04-06: 4 mg via INTRAVENOUS
  Filled 2018-04-06: qty 1

## 2018-04-06 MED ORDER — TRAMADOL HCL 50 MG PO TABS: 50.0000 mg | ORAL_TABLET | Freq: Four times a day (QID) | ORAL | 0 refills | Status: DC | PRN

## 2018-04-06 MED ORDER — SODIUM CHLORIDE 0.9 % IV BOLUS
1000.0000 mL | Freq: Once | INTRAVENOUS | Status: AC
  Administered 2018-04-06: 1000 mL via INTRAVENOUS

## 2018-04-06 MED ORDER — IOHEXOL 300 MG/ML  SOLN
75.0000 mL | Freq: Once | INTRAMUSCULAR | Status: AC | PRN
  Administered 2018-04-06: 75 mL via INTRAVENOUS

## 2018-04-06 NOTE — ED Provider Notes (Signed)
[place] Provider Note   CSN: [id number] Arrival date & time: 04/06/18  1603    History   Chief Complaint Chief Complaint  Patient presents with  . Foot Pain    HPI [name] is a 58 y.o. male.     HPI Patient presents with multiple complaints. Initial the patient complains of bilateral distal leg pain consistent with more severe episode of his typical neuropathy.  It is severe, burning However, soon after initial complaint the patient notes that he is having diffuse abdominal pain, nausea, vomiting, diarrhea. Fevers and chills are unclear. And is unclear which came first, the leg pain or the abdominal pain with nausea, vomiting, diarrhea. He is here with a male companion who does not contribute much to the history of present illness. Patient continues to take gabapentin, metformin as prescribed, notes that it has had little change in his pain. He acknowledges history of both diabetes and hypertension, as well as prior abdominal surgery, though he describes this as heartburn procedure.  Past Medical History:  Diagnosis Date  . Allergic rhinitis, cause unspecified   . Cervicalgia   . Chest pain Sept. 2005   multiple caths  /  cath 06/15/2010..normal coronaries,  EF 60%   (false postive nuclear in the past)  . Dyslipidemia    mixed  . Esophageal reflux   . Fibromyalgia   . Gastroparesis   . Headache(784.0)   . History of kidney stones   . Hyperlipidemia   . Hypertension   . IDDM (insulin dependent diabetes mellitus) ([place])   . Lumbago   . Neuropathy associated with endocrine disorder ([place])   . Overweight(278.02)   . Peripheral vascular disease ([place])   . Pulmonary embolus (HCC)    Hx of small left lower lobe  pulmonary embolus  . Pulmonary embolus ([place]) 2010ish  . Stroke [facility])    mini stroke aug 2016  . Type II or unspecified type diabetes mellitus with neurological manifestations, not stated as uncontrolled(250.60)   . Urticaria,  unspecified     Patient Active Problem List   Diagnosis Date Noted  . Gastroesophageal reflux disease without esophagitis 11/09/2017  . Abdominal pain, chronic, epigastric 11/09/2017  . Numbness   . Syncope 03/13/2017  . H/O cervical spine surgery 03/13/2017  . Cervical spondylosis with radiculopathy 12/29/2016  . CVA (cerebral vascular accident) ([place]) 08/30/2014  . 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 Neuropathy 08/30/2014  . AKI (acute kidney injury) ([place]) 08/30/2014  . Bilateral occipital neuralgia 10/30/2013  . Lumbar stenosis without neurogenic claudication 01/12/2013  . Type 2 diabetes mellitus with vascular disease ([place])   . Hypertension   . Hyperlipidemia   . Gastroparesis   . Pulmonary embolus ([place]) 2012   . Obesity   . GERD 01/14/2010  . Chest pain 10/05/2003    Past Surgical History:  Procedure Laterality Date  . ACHILLES TENDON REPAIR Left 2013  . AMPUTATION Left 06/02/2013   Procedure: AMPUTATION 1ST TOE LEFT FOOT;  Surgeon: Marcheta Grammes, DPM;  [facility];  Service: Orthopedics;  Laterality: Left;  . AMPUTATION Left 07/21/2013   Procedure: PARTIAL AMPUTATION 2ND TOE LEFT FOOT;  Surgeon: Marcheta Grammes, DPM;  Location: [facility];  Service: Podiatry;  Laterality: Left;  . ANTERIOR CERVICAL DECOMP/DISCECTOMY FUSION N/A 12/29/2016   Procedure: Cervical five-six Cervical six-seven Anterior cervical discectomy with fusion and plate fixation;  Surgeon: Ditty, Kevan Ny, MD;  Location: [place];  Service: Neurosurgery;  Laterality: N/A;  .  APPENDECTOMY    . BACK SURGERY  1999   x3  . BIOPSY  12/18/2017   Procedure: BIOPSY;  Surgeon: Rogene Houston, MD;  [facility];  Service: Endoscopy;;  gastric and esophagus  . ESOPHAGOGASTRODUODENOSCOPY (EGD) WITH PROPOFOL N/A 12/18/2017   Procedure: ESOPHAGOGASTRODUODENOSCOPY (EGD) WITH PROPOFOL;  Surgeon: Rogene Houston, MD;  [facility];  Service: Endoscopy;   Laterality: N/A;  9:25  . FOOT ARTHRODESIS Right 01/11/2014   Procedure: ARTHRODESIS INTERPHALANGEAL JOINT HALLUX RIGHT FOOT;  Surgeon: Marcheta Grammes, DPM;  Location: [facility];  Service: Podiatry;  Laterality: Right;  . HARDWARE REMOVAL Right 01/17/2015   Procedure: HARDWARE REMOVAL;  Surgeon: Caprice Beaver, DPM;  Location: [facility];  Service: Podiatry;  Laterality: Right;  . KNEE ARTHROSCOPY Right 10/2015  . LUMBAR LAMINECTOMY/DECOMPRESSION MICRODISCECTOMY Right 01/12/2013   Procedure: Right Lumbar Three-Four Laminotomy/Foraminotomy;  Surgeon: Floyce Stakes, MD;  [facility];  Service: Neurosurgery;  Laterality: Right;  Right Lumbar Three-Four Laminotomy/Foraminotomy  . NISSEN FUNDOPLICATION    . SHOULDER ARTHROSCOPY W/ ROTATOR CUFF REPAIR Right         Home Medications    Prior to Admission medications   Medication Sig Start Date End Date Taking? Authorizing Provider  aspirin EC 81 MG tablet Take 1 tablet (81 mg total) by mouth daily. 12/19/17  Yes Rehman, Mechele Dawley, MD  atorvastatin (LIPITOR) 40 MG tablet Take 1 tablet (40 mg total) by mouth daily. 03/17/17  Yes Hall, Carole N, DO  gabapentin (NEURONTIN) 600 MG tablet TAKE 4 TABLETS BY MOUTH AT BEDTIME. Patient taking differently: Take 600-2,400 mg by mouth See admin instructions. Take 600mg  by mouth in the morning and 2400mg  at bedtime 02/18/16  Yes Nida, Marella Chimes, MD  LEVEMIR 100 UNIT/ML injection INJECT 60 UNITS INTO THE SKIN AT BEDTIME Patient taking differently: Inject 60 Units into the skin at bedtime.  03/17/16  Yes Nida, Marella Chimes, MD  metFORMIN (GLUCOPHAGE) 500 MG tablet Take 1 tablet (500 mg total) by mouth 2 (two) times daily with a meal. 2 pills 2 times a day Patient taking differently: Take 1,000 mg by mouth 2 (two) times daily with a meal.  06/20/15  Yes Nida, Marella Chimes, MD  omega-3 acid ethyl esters (LOVAZA) 1 g capsule Take 1 capsule by mouth daily. 03/10/18  Yes [provider]   omeprazole (PRILOSEC) 20 MG capsule Take 20 mg by mouth daily.  04/01/18  Yes [provider]  ranolazine (RANEXA) 1000 MG SR tablet TAKE 1 TABLET BY MOUTH DAILY Patient taking differently: Take 1,000 mg by mouth daily.  02/23/18  Yes Herminio Commons, MD    Family History Family History  Problem Relation Age of Onset  . Heart attack Father 26  . Hyperlipidemia Father   . Hypertension Father   . Heart attack Mother 81  . Diabetes Mother   . Hypertension Mother   . Hyperlipidemia Mother   . Healthy Brother   . Seizures Brother   . Migraines Neg Hx     Social History Social History   Tobacco Use  . Smoking status: Former Smoker    Packs/day: 3.00    Years: 5.00    Pack years: 15.00    Types: Cigarettes    Start date: 06/20/1977    Last attempt to quit: 07/20/1982    Years since quitting: 35.7  . Smokeless tobacco: Never Used  . Tobacco comment: quit 1980's  Substance Use Topics  . Alcohol use:  No    Alcohol/week: 0.0 standard drinks  . Drug use: No     Allergies   Reglan [metoclopramide]   Review of Systems Review of Systems  Constitutional:       Per HPI, otherwise negative  HENT:       Per HPI, otherwise negative  Respiratory:       Per HPI, otherwise negative  Cardiovascular:       Per HPI, otherwise negative  Gastrointestinal: Positive for abdominal pain, diarrhea, nausea and vomiting.  Endocrine:       Negative aside from HPI  Genitourinary:       Neg aside from HPI   Musculoskeletal:       Per HPI, otherwise negative  Skin: Negative.   Neurological: Positive for numbness. Negative for syncope.  Psychiatric/Behavioral: The patient is nervous/anxious.      Physical Exam Updated Vital Signs BP 138/82   Pulse 98   Temp 97.9 F (36.6 C) (Oral)   Resp (!) 26   Ht 6\' 5"  (1.956 m)   Wt 121.5 kg   SpO2 97%   BMI 31.76 kg/m   Physical Exam Vitals signs and nursing note reviewed.  Constitutional:      General: He is not in acute  distress.    Appearance: He is well-developed.     Comments: Uncomfortable appearing adult male awake and alert  HENT:     Head: Normocephalic and atraumatic.  Eyes:     Conjunctiva/sclera: Conjunctivae normal.  Cardiovascular:     Rate and Rhythm: Normal rate and regular rhythm.  Pulmonary:     Effort: Pulmonary effort is normal. No respiratory distress.     Breath sounds: No stridor.  Abdominal:     Tenderness: There is abdominal tenderness. There is guarding.     Comments: Diffuse tenderness with guarding. There are multiple laparoscopy scars, unremarkable throughout the abdomen.  Musculoskeletal:     Comments: That is post amputation of 2 medial toes, left foot.  Skin:    General: Skin is warm and dry.     Comments: Skin on both feet unremarkable.  Neurological:     Mental Status: He is alert and oriented to person, place, and time.     Comments: Patient can move all toes appropriately, ankles appropriately, but has no sensation in the distal lower extremities bilaterally.  Psychiatric:        Mood and Affect: Mood is anxious.      ED Treatments / Results  Labs (all labs ordered are listed, but only abnormal results are displayed) Labs Reviewed  CBC WITH DIFFERENTIAL/PLATELET - Abnormal; Notable for the following components:      Result Value   WBC 11.9 (*)    Neutro Abs 11.1 (*)    Lymphs Abs 0.3 (*)    All other components within normal limits  COMPREHENSIVE METABOLIC PANEL - Abnormal; Notable for the following components:   CO2 17 (*)    Glucose, Bld 345 (*)    BUN 42 (*)    Creatinine, Ser 1.96 (*)    GFR calc non Af Amer 37 (*)    GFR calc Af Amer 42 (*)    All other components within normal limits  URINALYSIS, ROUTINE W REFLEX MICROSCOPIC - Abnormal; Notable for the following components:   Glucose, UA >=500 (*)    Ketones, ur 5 (*)    All other components within normal limits  LIPASE, BLOOD    EKG EKG Interpretation  Date/Time:  Tuesday  April 06 2018 16:31:28 EST Ventricular Rate:  105 PR Interval:    QRS Duration: 90 QT Interval:  316 QTC Calculation: 418 R Axis:   86 Text Interpretation:  Sinus tachycardia Premature ventricular complexes Abnormal ekg Confirmed by Carmin Muskrat [phone]) on 04/06/2018 4:40:33 PM   Radiology Ct Abdomen Pelvis W Contrast  Result Date: 04/06/2018 CLINICAL DATA:  58 y/o M; epigastric/mid abdominal pain, diarrhea, vomiting. EXAM: CT ABDOMEN AND PELVIS WITH CONTRAST TECHNIQUE: Multidetector CT imaging of the abdomen and pelvis was performed using the standard protocol following bolus administration of intravenous contrast. CONTRAST:  22mL OMNIPAQUE IOHEXOL 300 MG/ML  SOLN COMPARISON:  10/30/2017 CT abdomen and pelvis. 12/25/2017 abdominal ultrasound. FINDINGS: Lower chest: No acute abnormality. Hepatobiliary: No focal liver abnormality is seen. No gallstones, gallbladder wall thickening, or biliary dilatation. Pancreas: Unremarkable. No pancreatic ductal dilatation or surrounding inflammatory changes. Spleen: Normal in size without focal abnormality. Adrenals/Urinary Tract: Adrenal glands are unremarkable. Kidneys are normal, without renal calculi, focal lesion, or hydronephrosis. Bladder is unremarkable. Stomach/Bowel: Stomach is within normal limits. Appendectomy. No evidence of bowel wall thickening, distention, or inflammatory changes. Vascular/Lymphatic: Aortic atherosclerosis. No enlarged abdominal or pelvic lymph nodes. Reproductive: Prostate is unremarkable. Other: No abdominal wall hernia or abnormality. No abdominopelvic ascites. Musculoskeletal: No fracture is seen. L4-5 PLIF, interbody fusion, and laminectomy chronic postsurgical changes. IMPRESSION: No acute process identified. Stable unremarkable CT of the abdomen and pelvis. Aortic Atherosclerosis (ICD10-I70.0). Electronically Signed   By: Kristine Garbe M.D.   On: 04/06/2018 19:41    Procedures Procedures (including critical care  time)  Medications Ordered in ED Medications  ondansetron (ZOFRAN) injection 4 mg (4 mg Intravenous Given 04/06/18 1735)  morphine 4 MG/ML injection 4 mg (4 mg Intravenous Given 04/06/18 1736)  sodium chloride 0.9 % bolus 1,000 mL (0 mLs Intravenous Stopped 04/06/18 1833)  iohexol (OMNIPAQUE) 300 MG/ML solution 75 mL (75 mLs Intravenous Contrast Given 04/06/18 1904)     Initial Impression / Assessment and Plan / ED Course  I have reviewed the triage vital signs and the nursing notes.  Pertinent labs & imaging results that were available during my care of the patient were reviewed by me and considered in my medical decision making (see chart for details).        8:10 PM Patient much more comfortable in appearance, remains hemodynamically unremarkable. I discussed findings with him and his wife at length. Patient has a mild hyperglycemia, but no evidence for DKA. Patient's CT scan reassuring. There is mild leukocytosis, but he is afebrile, awake, alert, has had no vomiting since arrival. Some suspicion for viral illness contributing to his nausea, vomiting, diarrhea. Absent evidence for acute abdominal processes, new neurologic dysfunction, patient is appropriate for discharge with close outpatient follow-up.  Final Clinical Impressions(s) / ED Diagnoses  Nausea, vomiting and diarrhea Abdominal pain, generalized Bilateral lower extremity pain   Carmin Muskrat, MD 04/06/18 2013

## 2018-04-06 NOTE — Discharge Instructions (Signed)
As discussed, your evaluation today has been largely reassuring.  But, it is important that you monitor your condition carefully, and do not hesitate to return to the ED if you develop new, or concerning changes in your condition. ? ?Otherwise, please follow-up with your physician for appropriate ongoing care. ? ?

## 2018-04-06 NOTE — ED Triage Notes (Signed)
Pt c/o bilateral foot pain; pt states he is having burning to both his feet, pt has a hx of neuropathy; pt also states he has been vomiting and is having some chest pain

## 2018-04-06 NOTE — ED Notes (Signed)
Unable to have pt sign for d/c instructions due to computer not working at the time of discharge. Pt and pt's wife verbalized understanding of d/c instructions.

## 2018-04-20 DIAGNOSIS — E1121 Type 2 diabetes mellitus with diabetic nephropathy: Secondary | ICD-10-CM | POA: Diagnosis not present

## 2018-04-20 DIAGNOSIS — N183 Chronic kidney disease, stage 3 (moderate): Secondary | ICD-10-CM | POA: Diagnosis not present

## 2018-04-20 DIAGNOSIS — I10 Essential (primary) hypertension: Secondary | ICD-10-CM | POA: Diagnosis not present

## 2018-04-20 DIAGNOSIS — D631 Anemia in chronic kidney disease: Secondary | ICD-10-CM | POA: Diagnosis not present

## 2018-05-04 DIAGNOSIS — G609 Hereditary and idiopathic neuropathy, unspecified: Secondary | ICD-10-CM | POA: Diagnosis not present

## 2018-05-04 DIAGNOSIS — M79674 Pain in right toe(s): Secondary | ICD-10-CM | POA: Diagnosis not present

## 2018-05-04 DIAGNOSIS — M19071 Primary osteoarthritis, right ankle and foot: Secondary | ICD-10-CM | POA: Diagnosis not present

## 2018-05-05 DIAGNOSIS — Z6833 Body mass index (BMI) 33.0-33.9, adult: Secondary | ICD-10-CM | POA: Diagnosis not present

## 2018-05-05 DIAGNOSIS — E785 Hyperlipidemia, unspecified: Secondary | ICD-10-CM | POA: Diagnosis not present

## 2018-05-05 DIAGNOSIS — Z1331 Encounter for screening for depression: Secondary | ICD-10-CM | POA: Diagnosis not present

## 2018-05-05 DIAGNOSIS — M25511 Pain in right shoulder: Secondary | ICD-10-CM | POA: Diagnosis not present

## 2018-05-05 DIAGNOSIS — Z1339 Encounter for screening examination for other mental health and behavioral disorders: Secondary | ICD-10-CM | POA: Diagnosis not present

## 2018-05-05 DIAGNOSIS — Z7189 Other specified counseling: Secondary | ICD-10-CM | POA: Diagnosis not present

## 2018-05-05 DIAGNOSIS — Z299 Encounter for prophylactic measures, unspecified: Secondary | ICD-10-CM | POA: Diagnosis not present

## 2018-05-05 DIAGNOSIS — Z0000 Encounter for general adult medical examination without abnormal findings: Secondary | ICD-10-CM | POA: Diagnosis not present

## 2018-05-19 DIAGNOSIS — E785 Hyperlipidemia, unspecified: Secondary | ICD-10-CM | POA: Diagnosis not present

## 2018-05-19 DIAGNOSIS — Z299 Encounter for prophylactic measures, unspecified: Secondary | ICD-10-CM | POA: Diagnosis not present

## 2018-05-19 DIAGNOSIS — Z713 Dietary counseling and surveillance: Secondary | ICD-10-CM | POA: Diagnosis not present

## 2018-05-19 DIAGNOSIS — Z6838 Body mass index (BMI) 38.0-38.9, adult: Secondary | ICD-10-CM | POA: Diagnosis not present

## 2018-05-19 DIAGNOSIS — E1165 Type 2 diabetes mellitus with hyperglycemia: Secondary | ICD-10-CM | POA: Diagnosis not present

## 2018-06-02 DIAGNOSIS — E1165 Type 2 diabetes mellitus with hyperglycemia: Secondary | ICD-10-CM | POA: Diagnosis not present

## 2018-06-02 DIAGNOSIS — Z299 Encounter for prophylactic measures, unspecified: Secondary | ICD-10-CM | POA: Diagnosis not present

## 2018-06-02 DIAGNOSIS — E785 Hyperlipidemia, unspecified: Secondary | ICD-10-CM | POA: Diagnosis not present

## 2018-06-02 DIAGNOSIS — Z713 Dietary counseling and surveillance: Secondary | ICD-10-CM | POA: Diagnosis not present

## 2018-06-02 DIAGNOSIS — Z6833 Body mass index (BMI) 33.0-33.9, adult: Secondary | ICD-10-CM | POA: Diagnosis not present

## 2018-06-15 ENCOUNTER — Other Ambulatory Visit: Payer: Self-pay | Admitting: Cardiovascular Disease

## 2018-06-15 DIAGNOSIS — Z299 Encounter for prophylactic measures, unspecified: Secondary | ICD-10-CM | POA: Diagnosis not present

## 2018-06-15 DIAGNOSIS — E785 Hyperlipidemia, unspecified: Secondary | ICD-10-CM | POA: Diagnosis not present

## 2018-06-15 DIAGNOSIS — Z6833 Body mass index (BMI) 33.0-33.9, adult: Secondary | ICD-10-CM | POA: Diagnosis not present

## 2018-06-15 DIAGNOSIS — E1165 Type 2 diabetes mellitus with hyperglycemia: Secondary | ICD-10-CM | POA: Diagnosis not present

## 2018-06-15 DIAGNOSIS — M25511 Pain in right shoulder: Secondary | ICD-10-CM | POA: Diagnosis not present

## 2018-06-24 DIAGNOSIS — M25511 Pain in right shoulder: Secondary | ICD-10-CM | POA: Diagnosis not present

## 2018-07-01 DIAGNOSIS — M25511 Pain in right shoulder: Secondary | ICD-10-CM | POA: Diagnosis not present

## 2018-07-27 DIAGNOSIS — Z713 Dietary counseling and surveillance: Secondary | ICD-10-CM | POA: Diagnosis not present

## 2018-07-27 DIAGNOSIS — M25511 Pain in right shoulder: Secondary | ICD-10-CM | POA: Diagnosis not present

## 2018-07-27 DIAGNOSIS — E1165 Type 2 diabetes mellitus with hyperglycemia: Secondary | ICD-10-CM | POA: Diagnosis not present

## 2018-07-27 DIAGNOSIS — Z6833 Body mass index (BMI) 33.0-33.9, adult: Secondary | ICD-10-CM | POA: Diagnosis not present

## 2018-07-27 DIAGNOSIS — Z299 Encounter for prophylactic measures, unspecified: Secondary | ICD-10-CM | POA: Diagnosis not present

## 2018-08-12 DIAGNOSIS — M25511 Pain in right shoulder: Secondary | ICD-10-CM | POA: Diagnosis not present

## 2018-08-12 DIAGNOSIS — M7591 Shoulder lesion, unspecified, right shoulder: Secondary | ICD-10-CM | POA: Diagnosis not present

## 2018-08-19 DIAGNOSIS — S90821A Blister (nonthermal), right foot, initial encounter: Secondary | ICD-10-CM | POA: Diagnosis not present

## 2018-08-19 DIAGNOSIS — Z6833 Body mass index (BMI) 33.0-33.9, adult: Secondary | ICD-10-CM | POA: Diagnosis not present

## 2018-08-19 DIAGNOSIS — E1165 Type 2 diabetes mellitus with hyperglycemia: Secondary | ICD-10-CM | POA: Diagnosis not present

## 2018-08-19 DIAGNOSIS — Z299 Encounter for prophylactic measures, unspecified: Secondary | ICD-10-CM | POA: Diagnosis not present

## 2018-08-19 DIAGNOSIS — M7581 Other shoulder lesions, right shoulder: Secondary | ICD-10-CM | POA: Diagnosis not present

## 2018-08-24 DIAGNOSIS — S90821A Blister (nonthermal), right foot, initial encounter: Secondary | ICD-10-CM | POA: Diagnosis not present

## 2018-08-27 DIAGNOSIS — M25511 Pain in right shoulder: Secondary | ICD-10-CM | POA: Diagnosis not present

## 2018-09-06 DIAGNOSIS — Z91030 Bee allergy status: Secondary | ICD-10-CM | POA: Diagnosis not present

## 2018-09-06 DIAGNOSIS — Z299 Encounter for prophylactic measures, unspecified: Secondary | ICD-10-CM | POA: Diagnosis not present

## 2018-09-06 DIAGNOSIS — K219 Gastro-esophageal reflux disease without esophagitis: Secondary | ICD-10-CM | POA: Diagnosis not present

## 2018-09-06 DIAGNOSIS — E1165 Type 2 diabetes mellitus with hyperglycemia: Secondary | ICD-10-CM | POA: Diagnosis not present

## 2018-09-06 DIAGNOSIS — Z6832 Body mass index (BMI) 32.0-32.9, adult: Secondary | ICD-10-CM | POA: Diagnosis not present

## 2018-09-10 ENCOUNTER — Other Ambulatory Visit: Payer: Self-pay | Admitting: Cardiovascular Disease

## 2018-09-10 DIAGNOSIS — M25511 Pain in right shoulder: Secondary | ICD-10-CM | POA: Diagnosis not present

## 2018-09-16 DIAGNOSIS — Z299 Encounter for prophylactic measures, unspecified: Secondary | ICD-10-CM | POA: Diagnosis not present

## 2018-09-16 DIAGNOSIS — E785 Hyperlipidemia, unspecified: Secondary | ICD-10-CM | POA: Diagnosis not present

## 2018-09-16 DIAGNOSIS — Z6832 Body mass index (BMI) 32.0-32.9, adult: Secondary | ICD-10-CM | POA: Diagnosis not present

## 2018-09-16 DIAGNOSIS — E1165 Type 2 diabetes mellitus with hyperglycemia: Secondary | ICD-10-CM | POA: Diagnosis not present

## 2018-09-16 DIAGNOSIS — Z713 Dietary counseling and surveillance: Secondary | ICD-10-CM | POA: Diagnosis not present

## 2018-09-24 ENCOUNTER — Telehealth: Payer: Self-pay

## 2018-09-24 NOTE — Telephone Encounter (Signed)
Request received and placed in Koneswaran's basket.

## 2018-09-24 NOTE — Telephone Encounter (Signed)
Patient calling in requesting update on surgical clearance paperwork. Reports that Nickola Major, surg coordinator at [facility] faxed paperwork on 09/16/18. She will be faxing this again today.   No notes in patient's chart.  Patient is pending rotator cuff repair by Dr French Ana. Kelly High's contact information is [phone] ext [phone].

## 2018-09-30 ENCOUNTER — Ambulatory Visit: Payer: Self-pay | Admitting: Physician Assistant

## 2018-09-30 ENCOUNTER — Other Ambulatory Visit (HOSPITAL_COMMUNITY)
Admission: RE | Admit: 2018-09-30 | Discharge: 2018-09-30 | Disposition: A | Discharge status: Home / Self Care | Payer: Medicare HMO | Source: Ambulatory Visit | Attending: Orthopedic Surgery | Admitting: Orthopedic Surgery

## 2018-09-30 DIAGNOSIS — M19011 Primary osteoarthritis, right shoulder: Secondary | ICD-10-CM | POA: Insufficient documentation

## 2018-09-30 DIAGNOSIS — Z20828 Contact with and (suspected) exposure to other viral communicable diseases: Secondary | ICD-10-CM | POA: Diagnosis not present

## 2018-09-30 DIAGNOSIS — Z01812 Encounter for preprocedural laboratory examination: Secondary | ICD-10-CM | POA: Diagnosis not present

## 2018-09-30 LAB — SARS CORONAVIRUS 2 (TAT 6-24 HRS): SARS Coronavirus 2: NEGATIVE

## 2018-09-30 NOTE — H&P (View-Only) (Signed)
[name] is an 58 y.o. male.   Chief Complaint: right shoulder pain HPI:The patient is 58 years old. He had a fall in the snow. He had a prior cuff repair in [place].  He does not recall the surgeon. He obviously has a significant medical history although he is only 57.  He has a past medical history of diabetes mellitus,  coronary artery disease, stroke times two. He has a history of Nissan, urticaria, pulmonary embolus, cervicalgia, GERD. Again, he had an injection by [facility] without much improvement, a day or two.  It was listed as an IM injection, it is not clear to me it was an intra-articular subacromial injection.  He was not responsive to an injection. He has had two different injections.   Past Medical History:  Diagnosis Date  . Allergic rhinitis, cause unspecified   . Cervicalgia   . Chest pain Sept. 2005   multiple caths  /  cath 06/15/2010..normal coronaries,  EF 60%   (false postive nuclear in the past)  . Dyslipidemia    mixed  . Esophageal reflux   . Fibromyalgia   . Gastroparesis   . Headache(784.0)   . History of kidney stones   . Hyperlipidemia   . Hypertension   . IDDM (insulin dependent diabetes mellitus) ([place])   . Lumbago   . Neuropathy associated with endocrine disorder ([place])   . Overweight(278.02)   . Peripheral vascular disease ([place])   . Pulmonary embolus (HCC)    Hx of small left lower lobe  pulmonary embolus  . Pulmonary embolus ([place]) 2010ish  . Stroke [facility])    mini stroke aug 2016  . Type II or unspecified type diabetes mellitus with neurological manifestations, not stated as uncontrolled(250.60)   . Urticaria, unspecified     Past Surgical History:  Procedure Laterality Date  . ACHILLES TENDON REPAIR Left 2013  . AMPUTATION Left 06/02/2013   Procedure: AMPUTATION 1ST TOE LEFT FOOT;  Surgeon: Marcheta Grammes, DPM;  Location: [facility];  Service: Orthopedics;  Laterality: Left;  . AMPUTATION Left 07/21/2013   Procedure:  PARTIAL AMPUTATION 2ND TOE LEFT FOOT;  Surgeon: Marcheta Grammes, DPM;  Location: [facility];  Service: Podiatry;  Laterality: Left;  . ANTERIOR CERVICAL DECOMP/DISCECTOMY FUSION N/A 12/29/2016   Procedure: Cervical five-six Cervical six-seven Anterior cervical discectomy with fusion and plate fixation;  Surgeon: Ditty, Kevan Ny, MD;  Location: [place];  Service: Neurosurgery;  Laterality: N/A;  . APPENDECTOMY    . BACK SURGERY  1999   x3  . BIOPSY  12/18/2017   Procedure: BIOPSY;  Surgeon: Rogene Houston, MD;  [facility];  Service: Endoscopy;;  gastric and esophagus  . ESOPHAGOGASTRODUODENOSCOPY (EGD) WITH PROPOFOL N/A 12/18/2017   Procedure: ESOPHAGOGASTRODUODENOSCOPY (EGD) WITH PROPOFOL;  Surgeon: Rogene Houston, MD;  [facility];  Service: Endoscopy;  Laterality: N/A;  9:25  . FOOT ARTHRODESIS Right 01/11/2014   Procedure: ARTHRODESIS INTERPHALANGEAL JOINT HALLUX RIGHT FOOT;  Surgeon: Marcheta Grammes, DPM;  Location: [facility];  Service: Podiatry;  Laterality: Right;  . HARDWARE REMOVAL Right 01/17/2015   Procedure: HARDWARE REMOVAL;  Surgeon: Caprice Beaver, DPM;  Location: [facility];  Service: Podiatry;  Laterality: Right;  . KNEE ARTHROSCOPY Right 10/2015  . LUMBAR LAMINECTOMY/DECOMPRESSION MICRODISCECTOMY Right 01/12/2013   Procedure: Right Lumbar Three-Four Laminotomy/Foraminotomy;  Surgeon: Floyce Stakes, MD;  [facility];  Service: Neurosurgery;  Laterality: Right;  Right Lumbar Three-Four Laminotomy/Foraminotomy  . NISSEN  FUNDOPLICATION    . SHOULDER ARTHROSCOPY W/ ROTATOR CUFF REPAIR Right     Family History  Problem Relation Age of Onset  . Heart attack Father 81  . Hyperlipidemia Father   . Hypertension Father   . Heart attack Mother 1  . Diabetes Mother   . Hypertension Mother   . Hyperlipidemia Mother   . Healthy Brother   . Seizures Brother   . Migraines Neg Hx    Social History:  reports that he quit smoking  about 36 years ago. His smoking use included cigarettes. He started smoking about 41 years ago. He has a 15.00 pack-year smoking history. He has never used smokeless tobacco. He reports that he does not drink alcohol or use drugs.  Allergies:  Allergies  Allergen Reactions  . Reglan [Metoclopramide] Itching and Rash    (Not in a hospital admission)   No results found for this or any previous visit (from the past 48 hour(s)). No results found.  Review of Systems  Constitutional: Positive for weight loss.  Musculoskeletal: Positive for falls and joint pain.  Neurological: Positive for headaches.  Endo/Heme/Allergies: Bruises/bleeds easily.  All other systems reviewed and are negative.   There were no vitals taken for this visit. Physical Exam  Constitutional: He is oriented to person, place, and time. He appears well-developed and well-nourished. No distress.  HENT:  Head: Normocephalic and atraumatic.  Eyes: Pupils are equal, round, and reactive to light. Conjunctivae and EOM are normal.  Neck: Normal range of motion. Neck supple.  Cardiovascular: Normal rate and intact distal pulses.  Respiratory: Effort normal. No respiratory distress.  GI: Soft. He exhibits no distension. There is no abdominal tenderness.  Musculoskeletal:     Right shoulder: He exhibits decreased range of motion, tenderness and pain.  Neurological: He is alert and oriented to person, place, and time.  Skin: Skin is warm and dry. No rash noted. No erythema.  Psychiatric: He has a normal mood and affect. His behavior is normal.     Assessment/Plan He does have medical risk factors and he has to have clearance from his cardiology and medical doctor for a right shoulder arthroscopy, acromioplasty, distal clavicle excision. 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 He has had  previous cuff repair but no obvious recurrence. He would need medical clearance. We will proceed  on with scheduling  once that is performed.   Chriss Czar, PA-C 09/30/2018, 11:14 AM

## 2018-09-30 NOTE — H&P (Signed)
[name] is an 58 y.o. male.   Chief Complaint: right shoulder pain HPI:The patient is 58 years old. He had a fall in the snow. He had a prior cuff repair in [place].  He does not recall the surgeon. He obviously has a significant medical history although he is only 8.  He has a past medical history of diabetes mellitus,  coronary artery disease, stroke times two. He has a history of Nissan, urticaria, pulmonary embolus, cervicalgia, GERD. Again, he had an injection by [facility] without much improvement, a day or two.  It was listed as an IM injection, it is not clear to me it was an intra-articular subacromial injection.  He was not responsive to an injection. He has had two different injections.   Past Medical History:  Diagnosis Date  . Allergic rhinitis, cause unspecified   . Cervicalgia   . Chest pain Sept. 2005   multiple caths  /  cath 06/15/2010..normal coronaries,  EF 60%   (false postive nuclear in the past)  . Dyslipidemia    mixed  . Esophageal reflux   . Fibromyalgia   . Gastroparesis   . Headache(784.0)   . History of kidney stones   . Hyperlipidemia   . Hypertension   . IDDM (insulin dependent diabetes mellitus) ([place])   . Lumbago   . Neuropathy associated with endocrine disorder ([name])   . Overweight(278.02)   . Peripheral vascular disease ([place])   . Pulmonary embolus (HCC)    Hx of small left lower lobe  pulmonary embolus  . Pulmonary embolus ([place]) 2010ish  . Stroke [facility])    mini stroke aug 2016  . Type II or unspecified type diabetes mellitus with neurological manifestations, not stated as uncontrolled(250.60)   . Urticaria, unspecified     Past Surgical History:  Procedure Laterality Date  . ACHILLES TENDON REPAIR Left 2013  . AMPUTATION Left 06/02/2013   Procedure: AMPUTATION 1ST TOE LEFT FOOT;  Surgeon: Marcheta Grammes, DPM;  [facility];  Service: Orthopedics;  Laterality: Left;  . AMPUTATION Left 07/21/2013   Procedure:  PARTIAL AMPUTATION 2ND TOE LEFT FOOT;  Surgeon: Marcheta Grammes, DPM;  [facility];  Service: Podiatry;  Laterality: Left;  . ANTERIOR CERVICAL DECOMP/DISCECTOMY FUSION N/A 12/29/2016   Procedure: Cervical five-six Cervical six-seven Anterior cervical discectomy with fusion and plate fixation;  Surgeon: Ditty, Kevan Ny, MD;  Location: [place];  Service: Neurosurgery;  Laterality: N/A;  . APPENDECTOMY    . BACK SURGERY  1999   x3  . BIOPSY  12/18/2017   Procedure: BIOPSY;  Surgeon: Rogene Houston, MD;  [facility];  Service: Endoscopy;;  gastric and esophagus  . ESOPHAGOGASTRODUODENOSCOPY (EGD) WITH PROPOFOL N/A 12/18/2017   Procedure: ESOPHAGOGASTRODUODENOSCOPY (EGD) WITH PROPOFOL;  Surgeon: Rogene Houston, MD;  [facility];  Service: Endoscopy;  Laterality: N/A;  9:25  . FOOT ARTHRODESIS Right 01/11/2014   Procedure: ARTHRODESIS INTERPHALANGEAL JOINT HALLUX RIGHT FOOT;  Surgeon: Marcheta Grammes, DPM;  [facility];  Service: Podiatry;  Laterality: Right;  . HARDWARE REMOVAL Right 01/17/2015   Procedure: HARDWARE REMOVAL;  Surgeon: Caprice Beaver, DPM;  [facility];  Service: Podiatry;  Laterality: Right;  . KNEE ARTHROSCOPY Right 10/2015  . LUMBAR LAMINECTOMY/DECOMPRESSION MICRODISCECTOMY Right 01/12/2013   Procedure: Right Lumbar Three-Four Laminotomy/Foraminotomy;  Surgeon: Floyce Stakes, MD;  [facility];  Service: Neurosurgery;  Laterality: Right;  Right Lumbar Three-Four Laminotomy/Foraminotomy  . NISSEN  FUNDOPLICATION    . SHOULDER ARTHROSCOPY W/ ROTATOR CUFF REPAIR Right     Family History  Problem Relation Age of Onset  . Heart attack Father 60  . Hyperlipidemia Father   . Hypertension Father   . Heart attack Mother 67  . Diabetes Mother   . Hypertension Mother   . Hyperlipidemia Mother   . Healthy Brother   . Seizures Brother   . Migraines Neg Hx    Social History:  reports that he quit smoking  about 36 years ago. His smoking use included cigarettes. He started smoking about 41 years ago. He has a 15.00 pack-year smoking history. He has never used smokeless tobacco. He reports that he does not drink alcohol or use drugs.  Allergies:  Allergies  Allergen Reactions  . Reglan [Metoclopramide] Itching and Rash    (Not in a hospital admission)   No results found for this or any previous visit (from the past 48 hour(s)). No results found.  Review of Systems  Constitutional: Positive for weight loss.  Musculoskeletal: Positive for falls and joint pain.  Neurological: Positive for headaches.  Endo/Heme/Allergies: Bruises/bleeds easily.  All other systems reviewed and are negative.   There were no vitals taken for this visit. Physical Exam  Constitutional: He is oriented to person, place, and time. He appears well-developed and well-nourished. No distress.  HENT:  Head: Normocephalic and atraumatic.  Eyes: Pupils are equal, round, and reactive to light. Conjunctivae and EOM are normal.  Neck: Normal range of motion. Neck supple.  Cardiovascular: Normal rate and intact distal pulses.  Respiratory: Effort normal. No respiratory distress.  GI: Soft. He exhibits no distension. There is no abdominal tenderness.  Musculoskeletal:     Right shoulder: He exhibits decreased range of motion, tenderness and pain.  Neurological: He is alert and oriented to person, place, and time.  Skin: Skin is warm and dry. No rash noted. No erythema.  Psychiatric: He has a normal mood and affect. His behavior is normal.     Assessment/Plan He does have medical risk factors and he has to have clearance from his cardiology and medical doctor for a right shoulder arthroscopy, acromioplasty, distal clavicle excision. 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 He has had  previous cuff repair but no obvious recurrence. He would need medical clearance. We will proceed  on with scheduling  once that is performed.   Chriss Czar, PA-C 09/30/2018, 11:14 AM

## 2018-10-01 ENCOUNTER — Other Ambulatory Visit: Payer: Self-pay

## 2018-10-01 ENCOUNTER — Encounter (HOSPITAL_BASED_OUTPATIENT_CLINIC_OR_DEPARTMENT_OTHER): Payer: Self-pay

## 2018-10-01 MED ORDER — LACTATED RINGERS IV SOLN
INTRAVENOUS | Status: DC
  Filled 2018-10-01: qty 1000

## 2018-10-01 NOTE — Progress Notes (Signed)
SPOKE W/  [name]     SCREENING SYMPTOMS OF COVID 19:   COUGH--NO  RUNNY NOSE--- NO  SORE THROAT---NO  NASAL CONGESTION----NO  SNEEZING----NO  SHORTNESS OF BREATH---NO  DIFFICULTY BREATHING---NO  TEMP >100.0 -----NO  UNEXPLAINED BODY ACHES------NO  CHILLS -------- NO  HEADACHES ---------NO  LOSS OF SMELL/ TASTE --------NO    HAVE YOU OR ANY FAMILY MEMBER TRAVELLED PAST 14 DAYS OUT OF THE   COUNTY---NO STATE----NO COUNTRY----NO  HAVE YOU OR ANY FAMILY MEMBER BEEN EXPOSED TO ANYONE WITH COVID 19? NO

## 2018-10-01 NOTE — Progress Notes (Signed)
PCP - Arsenio Katz N.P. medical clearance 09/16/2018 and last office visit note 08/19/2018 in chart Cardiologist - Dr. Bronson Ing Last office visit note 04/02/2018 in epic and cardiac clearance 09/24/2018 in chart Renal surgical clearance Dr. Lowanda Foster 08/12/2018 in chart    Chest x-ray - N/A EKG - 04/06/2018 epic/chart Stress Test - N/A ECHO - 03/13/2017 epic/chart Cardiac Cath - N/A  Sleep Study - N/A CPAP - N/A  Fasting Blood Sugar -93-156  Checks Blood Sugar 2 times a day  Blood Thinner Instructions:N/A Aspirin Instructions:N/A Last Dose:N/A  Anesthesia review: N/A  Patient denies shortness of breath, fever, cough and chest pain at PAT appointment   Patient verbalized understanding of instructions that were given to them at the PAT appointment. Patient was also instructed that they will need to review over the PAT instructions again at home before surgery.

## 2018-10-01 NOTE — Progress Notes (Signed)
Spoke with: Sonia Side NPO:  After Midnight, no gum, candy, or mints   Arrival time:  1030AM Labs: Istat 4 AM medications: None per pt. request Pre op orders: Yes Ride home: Eustaquio Maize (wife) [phone]

## 2018-10-04 ENCOUNTER — Ambulatory Visit (HOSPITAL_BASED_OUTPATIENT_CLINIC_OR_DEPARTMENT_OTHER): Payer: Medicare HMO | Admitting: Anesthesiology

## 2018-10-04 ENCOUNTER — Ambulatory Visit (HOSPITAL_BASED_OUTPATIENT_CLINIC_OR_DEPARTMENT_OTHER)
Admission: RE | Admit: 2018-10-04 | Discharge: 2018-10-04 | Disposition: A | Discharge status: Home / Self Care | Payer: Medicare HMO | Attending: Orthopedic Surgery | Admitting: Orthopedic Surgery

## 2018-10-04 ENCOUNTER — Other Ambulatory Visit: Payer: Self-pay

## 2018-10-04 ENCOUNTER — Encounter (HOSPITAL_BASED_OUTPATIENT_CLINIC_OR_DEPARTMENT_OTHER)
Admission: RE | Disposition: A | Discharge status: Home / Self Care | Payer: Self-pay | Source: Home / Self Care | Attending: Orthopedic Surgery

## 2018-10-04 ENCOUNTER — Encounter (HOSPITAL_BASED_OUTPATIENT_CLINIC_OR_DEPARTMENT_OTHER): Payer: Self-pay | Admitting: Anesthesiology

## 2018-10-04 DIAGNOSIS — K3184 Gastroparesis: Secondary | ICD-10-CM | POA: Insufficient documentation

## 2018-10-04 DIAGNOSIS — W000XXA Fall on same level due to ice and snow, initial encounter: Secondary | ICD-10-CM | POA: Insufficient documentation

## 2018-10-04 DIAGNOSIS — S43491A Other sprain of right shoulder joint, initial encounter: Secondary | ICD-10-CM | POA: Diagnosis not present

## 2018-10-04 DIAGNOSIS — Z6830 Body mass index (BMI) 30.0-30.9, adult: Secondary | ICD-10-CM | POA: Insufficient documentation

## 2018-10-04 DIAGNOSIS — E1140 Type 2 diabetes mellitus with diabetic neuropathy, unspecified: Secondary | ICD-10-CM | POA: Diagnosis not present

## 2018-10-04 DIAGNOSIS — Z8673 Personal history of transient ischemic attack (TIA), and cerebral infarction without residual deficits: Secondary | ICD-10-CM | POA: Diagnosis not present

## 2018-10-04 DIAGNOSIS — Z86711 Personal history of pulmonary embolism: Secondary | ICD-10-CM | POA: Diagnosis not present

## 2018-10-04 DIAGNOSIS — G8918 Other acute postprocedural pain: Secondary | ICD-10-CM | POA: Diagnosis not present

## 2018-10-04 DIAGNOSIS — Z87891 Personal history of nicotine dependence: Secondary | ICD-10-CM | POA: Insufficient documentation

## 2018-10-04 DIAGNOSIS — E1151 Type 2 diabetes mellitus with diabetic peripheral angiopathy without gangrene: Secondary | ICD-10-CM | POA: Insufficient documentation

## 2018-10-04 DIAGNOSIS — M19011 Primary osteoarthritis, right shoulder: Secondary | ICD-10-CM | POA: Insufficient documentation

## 2018-10-04 DIAGNOSIS — M7541 Impingement syndrome of right shoulder: Secondary | ICD-10-CM | POA: Insufficient documentation

## 2018-10-04 DIAGNOSIS — M7551 Bursitis of right shoulder: Secondary | ICD-10-CM | POA: Diagnosis not present

## 2018-10-04 DIAGNOSIS — Z89412 Acquired absence of left great toe: Secondary | ICD-10-CM | POA: Diagnosis not present

## 2018-10-04 DIAGNOSIS — Z89422 Acquired absence of other left toe(s): Secondary | ICD-10-CM | POA: Insufficient documentation

## 2018-10-04 DIAGNOSIS — E663 Overweight: Secondary | ICD-10-CM | POA: Diagnosis not present

## 2018-10-04 DIAGNOSIS — E1143 Type 2 diabetes mellitus with diabetic autonomic (poly)neuropathy: Secondary | ICD-10-CM | POA: Insufficient documentation

## 2018-10-04 DIAGNOSIS — M24111 Other articular cartilage disorders, right shoulder: Secondary | ICD-10-CM | POA: Diagnosis not present

## 2018-10-04 DIAGNOSIS — S46011A Strain of muscle(s) and tendon(s) of the rotator cuff of right shoulder, initial encounter: Secondary | ICD-10-CM | POA: Insufficient documentation

## 2018-10-04 DIAGNOSIS — M75111 Incomplete rotator cuff tear or rupture of right shoulder, not specified as traumatic: Secondary | ICD-10-CM | POA: Diagnosis not present

## 2018-10-04 HISTORY — DX: Renal hypoplasia, unilateral: Q60.3

## 2018-10-04 HISTORY — DX: Chronic kidney disease, stage 3 unspecified: N18.30

## 2018-10-04 HISTORY — PX: SHOULDER ARTHROSCOPY WITH DISTAL CLAVICLE RESECTION: SHX5675

## 2018-10-04 HISTORY — DX: Other complications of anesthesia, initial encounter: T88.59XA

## 2018-10-04 HISTORY — DX: Benign prostatic hyperplasia without lower urinary tract symptoms: N40.0

## 2018-10-04 HISTORY — DX: Presence of spectacles and contact lenses: Z97.3

## 2018-10-04 LAB — POCT I-STAT, CHEM 8
BUN: 32 mg/dL — ABNORMAL HIGH (ref 6–20)
Calcium, Ion: 1.29 mmol/L (ref 1.15–1.40)
Chloride: 110 mmol/L (ref 98–111)
Creatinine, Ser: 1.9 mg/dL — ABNORMAL HIGH (ref 0.61–1.24)
Glucose, Bld: 162 mg/dL — ABNORMAL HIGH (ref 70–99)
HCT: 40 % (ref 39.0–52.0)
Hemoglobin: 13.6 g/dL (ref 13.0–17.0)
Potassium: 4.6 mmol/L (ref 3.5–5.1)
Sodium: 142 mmol/L (ref 135–145)
TCO2: 19 mmol/L — ABNORMAL LOW (ref 22–32)

## 2018-10-04 LAB — GLUCOSE, CAPILLARY: Glucose-Capillary: 109 mg/dL — ABNORMAL HIGH (ref 70–99)

## 2018-10-04 SURGERY — SHOULDER ARTHROSCOPY WITH DISTAL CLAVICLE RESECTION
Anesthesia: General | Site: Shoulder | Laterality: Right

## 2018-10-04 MED ORDER — SODIUM CHLORIDE 0.9 % IR SOLN
Status: DC | PRN
  Administered 2018-10-04: 12000 mL

## 2018-10-04 MED ORDER — FENTANYL CITRATE (PF) 100 MCG/2ML IJ SOLN
INTRAMUSCULAR | Status: AC
  Filled 2018-10-04: qty 2

## 2018-10-04 MED ORDER — OXYCODONE HCL 5 MG/5ML PO SOLN
5.0000 mg | Freq: Once | ORAL | Status: DC | PRN
  Filled 2018-10-04: qty 5

## 2018-10-04 MED ORDER — SUGAMMADEX SODIUM 200 MG/2ML IV SOLN
INTRAVENOUS | Status: DC | PRN
  Administered 2018-10-04: 20 mg via INTRAVENOUS

## 2018-10-04 MED ORDER — METHYLPREDNISOLONE ACETATE 80 MG/ML IJ SUSP
80.0000 mg | Freq: Once | INTRAMUSCULAR | Status: DC
  Filled 2018-10-04: qty 1

## 2018-10-04 MED ORDER — PROPOFOL 10 MG/ML IV BOLUS
INTRAVENOUS | Status: AC
  Filled 2018-10-04: qty 20

## 2018-10-04 MED ORDER — FENTANYL CITRATE (PF) 100 MCG/2ML IJ SOLN
50.0000 ug | Freq: Once | INTRAMUSCULAR | Status: AC
  Administered 2018-10-04: 50 ug via INTRAVENOUS
  Filled 2018-10-04: qty 1

## 2018-10-04 MED ORDER — BUPIVACAINE HCL (PF) 0.5 % IJ SOLN
INTRAMUSCULAR | Status: DC | PRN
  Administered 2018-10-04: 15 mL via PERINEURAL

## 2018-10-04 MED ORDER — PROMETHAZINE HCL 25 MG/ML IJ SOLN
6.2500 mg | INTRAMUSCULAR | Status: DC | PRN
  Filled 2018-10-04: qty 1

## 2018-10-04 MED ORDER — ONDANSETRON HCL 4 MG/2ML IJ SOLN
INTRAMUSCULAR | Status: AC
  Filled 2018-10-04: qty 2

## 2018-10-04 MED ORDER — FENTANYL CITRATE (PF) 100 MCG/2ML IJ SOLN
25.0000 ug | INTRAMUSCULAR | Status: DC | PRN
  Filled 2018-10-04: qty 1

## 2018-10-04 MED ORDER — LIDOCAINE 2% (20 MG/ML) 5 ML SYRINGE
INTRAMUSCULAR | Status: AC
  Filled 2018-10-04: qty 5

## 2018-10-04 MED ORDER — METHOCARBAMOL 500 MG PO TABS: 500.0000 mg | ORAL_TABLET | Freq: Four times a day (QID) | ORAL | 0 refills | Status: DC | PRN

## 2018-10-04 MED ORDER — SODIUM CHLORIDE 0.9 % IV SOLN
INTRAVENOUS | Status: DC
  Administered 2018-10-04: 10:00:00 via INTRAVENOUS
  Filled 2018-10-04: qty 1000

## 2018-10-04 MED ORDER — DEXAMETHASONE SODIUM PHOSPHATE 4 MG/ML IJ SOLN
INTRAMUSCULAR | Status: DC | PRN
  Administered 2018-10-04: 4 mg via INTRAVENOUS

## 2018-10-04 MED ORDER — BUPIVACAINE LIPOSOME 1.3 % IJ SUSP
INTRAMUSCULAR | Status: DC | PRN
  Administered 2018-10-04: 10 mL via PERINEURAL

## 2018-10-04 MED ORDER — MIDAZOLAM HCL 2 MG/2ML IJ SOLN
1.0000 mg | Freq: Once | INTRAMUSCULAR | Status: AC
  Administered 2018-10-04: 1 mg via INTRAVENOUS
  Filled 2018-10-04: qty 1

## 2018-10-04 MED ORDER — PROPOFOL 10 MG/ML IV BOLUS
INTRAVENOUS | Status: DC | PRN
  Administered 2018-10-04: 200 mg via INTRAVENOUS

## 2018-10-04 MED ORDER — OXYCODONE HCL 5 MG PO TABS: ORAL_TABLET | ORAL | 0 refills | Status: DC

## 2018-10-04 MED ORDER — METHYLPREDNISOLONE ACETATE 80 MG/ML IJ SUSP
INTRAMUSCULAR | Status: DC | PRN
  Administered 2018-10-04: 80 mg via INTRA_ARTICULAR

## 2018-10-04 MED ORDER — KETOROLAC TROMETHAMINE 30 MG/ML IJ SOLN
30.0000 mg | Freq: Once | INTRAMUSCULAR | Status: DC | PRN
  Filled 2018-10-04: qty 1

## 2018-10-04 MED ORDER — MIDAZOLAM HCL 2 MG/2ML IJ SOLN
INTRAMUSCULAR | Status: AC
  Filled 2018-10-04: qty 2

## 2018-10-04 MED ORDER — BUPIVACAINE HCL (PF) 0.25 % IJ SOLN
INTRAMUSCULAR | Status: DC | PRN
  Administered 2018-10-04: 5 mL via INTRA_ARTICULAR

## 2018-10-04 MED ORDER — CEFAZOLIN SODIUM-DEXTROSE 2-4 GM/100ML-% IV SOLN
INTRAVENOUS | Status: AC
  Filled 2018-10-04: qty 100

## 2018-10-04 MED ORDER — CHLORHEXIDINE GLUCONATE 4 % EX LIQD
60.0000 mL | Freq: Once | CUTANEOUS | Status: DC
  Filled 2018-10-04: qty 118

## 2018-10-04 MED ORDER — SODIUM CHLORIDE 0.9 % IV SOLN
INTRAVENOUS | Status: DC
  Administered 2018-10-04: 14:00:00 via INTRAVENOUS
  Filled 2018-10-04: qty 1000

## 2018-10-04 MED ORDER — ROCURONIUM BROMIDE 100 MG/10ML IV SOLN
INTRAVENOUS | Status: DC | PRN
  Administered 2018-10-04: 60 mg via INTRAVENOUS

## 2018-10-04 MED ORDER — CEFAZOLIN SODIUM-DEXTROSE 2-4 GM/100ML-% IV SOLN
2.0000 g | INTRAVENOUS | Status: AC
  Administered 2018-10-04: 2 g via INTRAVENOUS
  Filled 2018-10-04: qty 100

## 2018-10-04 MED ORDER — ONDANSETRON HCL 4 MG/2ML IJ SOLN
INTRAMUSCULAR | Status: DC | PRN
  Administered 2018-10-04: 4 mg via INTRAVENOUS

## 2018-10-04 MED ORDER — ROCURONIUM BROMIDE 10 MG/ML (PF) SYRINGE
PREFILLED_SYRINGE | INTRAVENOUS | Status: AC
  Filled 2018-10-04: qty 10

## 2018-10-04 MED ORDER — DEXAMETHASONE SODIUM PHOSPHATE 10 MG/ML IJ SOLN
INTRAMUSCULAR | Status: AC
  Filled 2018-10-04: qty 1

## 2018-10-04 MED ORDER — OXYCODONE HCL 5 MG PO TABS
5.0000 mg | ORAL_TABLET | Freq: Once | ORAL | Status: DC | PRN
  Filled 2018-10-04: qty 1

## 2018-10-04 MED ORDER — LIDOCAINE HCL (CARDIAC) PF 100 MG/5ML IV SOSY
PREFILLED_SYRINGE | INTRAVENOUS | Status: DC | PRN
  Administered 2018-10-04: 100 mg via INTRAVENOUS

## 2018-10-04 SURGICAL SUPPLY — 83 items
AID PSTN UNV HD RSTRNT DISP (MISCELLANEOUS) ×1
APL SKNCLS STERI-STRIP NONHPOA (GAUZE/BANDAGES/DRESSINGS)
BENZOIN TINCTURE PRP APPL 2/3 (GAUZE/BANDAGES/DRESSINGS) IMPLANT
BLADE AVERAGE 25X9 (BLADE) IMPLANT
BLADE EXCALIBUR 4.0X13 (MISCELLANEOUS) ×1 IMPLANT
BLADE SURG 15 STRL LF DISP TIS (BLADE) IMPLANT
BLADE SURG 15 STRL SS (BLADE)
BUR EGG 3PK/BX (BURR) IMPLANT
BUR OVAL 4.0 (BURR) IMPLANT
BURR OVAL 8 FLU 5.0X13 (MISCELLANEOUS) ×1 IMPLANT
CANNULA 5.75X71 LONG (CANNULA) IMPLANT
CANNULA SHOULDER 7CM (CANNULA) ×3 IMPLANT
CANNULA TWIST IN 8.25X7CM (CANNULA) IMPLANT
CLEANER CAUTERY TIP 5X5 PAD (MISCELLANEOUS) IMPLANT
COVER WAND RF STERILE (DRAPES) IMPLANT
DECANTER SPIKE VIAL GLASS SM (MISCELLANEOUS) IMPLANT
DISSECTOR  3.8MM X 13CM (MISCELLANEOUS)
DISSECTOR 3.8MM X 13CM (MISCELLANEOUS) IMPLANT
DISSECTOR 4.0MM X 13CM (MISCELLANEOUS) IMPLANT
DRAPE SPLIT 6X30 W/TAPE (DRAPES) ×4 IMPLANT
DRAPE STERI 35X30 U-POUCH (DRAPES) ×2 IMPLANT
DRAPE SURG 17X23 STRL (DRAPES) ×2 IMPLANT
DRSG EMULSION OIL 3X3 NADH (GAUZE/BANDAGES/DRESSINGS) ×2 IMPLANT
DRSG MEPILEX BORDER 4X4 (GAUZE/BANDAGES/DRESSINGS) IMPLANT
DRSG MEPILEX BORDER 4X8 (GAUZE/BANDAGES/DRESSINGS) IMPLANT
DRSG PAD ABDOMINAL 8X10 ST (GAUZE/BANDAGES/DRESSINGS) ×2 IMPLANT
DURAPREP 26ML APPLICATOR (WOUND CARE) ×2 IMPLANT
ELECT REM PT RETURN 9FT ADLT (ELECTROSURGICAL) ×2
ELECTRODE REM PT RTRN 9FT ADLT (ELECTROSURGICAL) ×1 IMPLANT
GAUZE SPONGE 4X4 12PLY STRL (GAUZE/BANDAGES/DRESSINGS) ×2 IMPLANT
GLOVE BIO SURGEON STRL SZ7.5 (GLOVE) ×2 IMPLANT
GLOVE BIOGEL PI IND STRL 8 (GLOVE) ×2 IMPLANT
GLOVE BIOGEL PI INDICATOR 8 (GLOVE) ×2
GLOVE SURG ORTHO 8.0 STRL STRW (GLOVE) ×2 IMPLANT
GOWN STRL REUS W/ TWL LRG LVL3 (GOWN DISPOSABLE) ×1 IMPLANT
GOWN STRL REUS W/ TWL XL LVL3 (GOWN DISPOSABLE) ×1 IMPLANT
GOWN STRL REUS W/TWL LRG LVL3 (GOWN DISPOSABLE) ×2
GOWN STRL REUS W/TWL XL LVL3 (GOWN DISPOSABLE) ×4 IMPLANT
MANIFOLD NEPTUNE II (INSTRUMENTS) ×2 IMPLANT
NDL 1/2 CIR CATGUT .05X1.09 (NEEDLE) IMPLANT
NDL MAYO 6 CRC TAPER PT (NEEDLE) IMPLANT
NDL SCORPION MULTI FIRE (NEEDLE) IMPLANT
NEEDLE 1/2 CIR CATGUT .05X1.09 (NEEDLE) IMPLANT
NEEDLE MAYO 6 CRC TAPER PT (NEEDLE) IMPLANT
NEEDLE SCORPION MULTI FIRE (NEEDLE) IMPLANT
NS IRRIG 1000ML POUR BTL (IV SOLUTION) ×2 IMPLANT
PACK ARTHROSCOPY DSU (CUSTOM PROCEDURE TRAY) ×2 IMPLANT
PACK BASIN DAY SURGERY FS (CUSTOM PROCEDURE TRAY) ×2 IMPLANT
PAD CLEANER CAUTERY TIP 5X5 (MISCELLANEOUS)
PAD ORTHO SHOULDER 7X19 LRG (SOFTGOODS) IMPLANT
PENCIL BUTTON HOLSTER BLD 10FT (ELECTRODE) IMPLANT
PORT APPOLLO RF 90DEGREE MULTI (SURGICAL WAND) ×1 IMPLANT
RESTRAINT HEAD UNIVERSAL NS (MISCELLANEOUS) ×2 IMPLANT
SLEEVE SCD COMPRESS KNEE MED (MISCELLANEOUS) ×2 IMPLANT
SLING ARM FOAM STRAP LRG (SOFTGOODS) ×1 IMPLANT
SLING ULTRA II MEDIUM (SOFTGOODS) IMPLANT
SLING ULTRA II SMALL (SOFTGOODS) IMPLANT
SPONGE LAP 4X18 RFD (DISPOSABLE) IMPLANT
STRIP CLOSURE SKIN 1/2X4 (GAUZE/BANDAGES/DRESSINGS) IMPLANT
SUCTION FRAZIER HANDLE 10FR (MISCELLANEOUS)
SUCTION TUBE FRAZIER 10FR DISP (MISCELLANEOUS) IMPLANT
SUT BONE WAX W31G (SUTURE) IMPLANT
SUT ETHILON 3 0 PS 1 (SUTURE) ×2 IMPLANT
SUT FIBERWIRE #2 38 T-5 BLUE (SUTURE)
SUT MNCRL AB 3-0 PS2 18 (SUTURE) IMPLANT
SUT MON AB 3-0 SH 27 (SUTURE)
SUT MON AB 3-0 SH27 (SUTURE) IMPLANT
SUT PDS AB 0 CT1 27 (SUTURE) IMPLANT
SUT TICRON 1 T 12 (SUTURE) IMPLANT
SUT TIGER TAPE 7 IN WHITE (SUTURE) IMPLANT
SUT VIC AB 0 CT1 27 (SUTURE)
SUT VIC AB 0 CT1 27XBRD ANBCTR (SUTURE) IMPLANT
SUT VIC AB 1 CT1 27 (SUTURE)
SUT VIC AB 1 CT1 27XBRD ANBCTR (SUTURE) IMPLANT
SUT VIC AB 2-0 CT2 27 (SUTURE) ×4 IMPLANT
SUT VIC AB 2-0 SH 27 (SUTURE)
SUT VIC AB 2-0 SH 27XBRD (SUTURE) IMPLANT
SUTURE FIBERWR #2 38 T-5 BLUE (SUTURE) IMPLANT
TAPE CLOTH SURG 6X10 WHT LF (GAUZE/BANDAGES/DRESSINGS) ×1 IMPLANT
TAPE FIBER 2MM 7IN #2 BLUE (SUTURE) IMPLANT
TUBING ARTHROSCOPY IRRIG 16FT (MISCELLANEOUS) ×2 IMPLANT
WATER STERILE IRR 1000ML POUR (IV SOLUTION) ×2 IMPLANT
YANKAUER SUCT BULB TIP NO VENT (SUCTIONS) IMPLANT

## 2018-10-04 NOTE — Progress Notes (Signed)
Assisted Dr. Rose with right, ultrasound guided, interscalene  block. Side rails up, monitors on throughout procedure. See vital signs in flow sheet. Tolerated Procedure well. 

## 2018-10-04 NOTE — Discharge Instructions (Signed)
Diet: As you were doing prior to hospitalization   Activity: Increase activity slowly as tolerated  No lifting, may come out of sling after 48hrs   Shower: May shower without a dressing on post op day #2 NO SOAKING in tub   Dressing: You may change your dressing on post op day #2.  Then change the dressing daily with sterile 4"x4"s gauze dressing  Or band aids  To prevent constipation: you may use a stool softener such as -  Colace ( over the counter) 100 mg by mouth twice a day  Drink plenty of fluids ( prune juice may be helpful) and high fiber foods  Miralax ( over the counter) for constipation as needed.   Precautions: If you experience chest pain or shortness of breath - call 911 immediately For transfer to the hospital emergency department!!  If you develop a fever greater that 101 F, purulent drainage from wound, increased redness or drainage from wound, or calf pain -- Call the office   Follow- Up Appointment: Please call for an appointment to be seen in 1 week or as previously scheduled  [place] - [phone]   Regional Anesthesia Blocks  1. Numbness or the inability to move the "blocked" extremity may last from 3-48 hours after placement. The length of time depends on the medication injected and your individual response to the medication. If the numbness is not going away after 48 hours, call your surgeon.  2. The extremity that is blocked will need to be protected until the numbness is gone and the  Strength has returned. Because you cannot feel it, you will need to take extra care to avoid injury. Because it may be weak, you may have difficulty moving it or using it. You may not know what position it is in without looking at it while the block is in effect.  3. For blocks in the legs and feet, returning to weight bearing and walking needs to be done carefully. You will need to wait until the numbness is entirely gone and the strength has returned. You should be able to  move your leg and foot normally before you try and bear weight or walk. You will need someone to be with you when you first try to ensure you do not fall and possibly risk injury.  4. Bruising and tenderness at the needle site are common side effects and will resolve in a few days.  5. Persistent numbness or new problems with movement should be communicated to the surgeon or the [place] [phone] [place] [phone]).  Post Anesthesia Home Care Instructions  Activity: Get plenty of rest for the remainder of the day. A responsible individual must stay with you for 24 hours following the procedure.  For the next 24 hours, DO NOT: -Drive a car -Paediatric nurse -Drink alcoholic beverages -Take any medication unless instructed by your physician -Make any legal decisions or sign important papers.  Meals: Start with liquid foods such as gelatin or soup. Progress to regular foods as tolerated. Avoid greasy, spicy, heavy foods. If nausea and/or vomiting occur, drink only clear liquids until the nausea and/or vomiting subsides. Call your physician if vomiting continues.  Special Instructions/Symptoms: Your throat may feel dry or sore from the anesthesia or the breathing tube placed in your throat during surgery. If this causes discomfort, gargle with warm salt water. The discomfort should disappear within 24 hours.  Information for Discharge Teaching: EXPAREL (bupivacaine liposome injectable suspension)  Your surgeon or anesthesiologist gave you EXPAREL(bupivacaine) to help control your pain after surgery.   EXPAREL is a local anesthetic that provides pain relief by numbing the tissue around the surgical site.  EXPAREL is designed to release pain medication over time and can control pain for up to 72 hours.  Depending on how you respond to EXPAREL, you may require less pain medication during your recovery.  Possible side effects:  Temporary loss  of sensation or ability to move in the area where bupivacaine was injected.  Nausea, vomiting, constipation  Rarely, numbness and tingling in your mouth or lips, lightheadedness, or anxiety may occur.  Call your doctor right away if you think you may be experiencing any of these sensations, or if you have other questions regarding possible side effects.  Follow all other discharge instructions given to you by your surgeon or nurse. Eat a healthy diet and drink plenty of water or other fluids.  If you return to the hospital for any reason within 96 hours following the administration of EXPAREL, it is important for health care providers to know that you have received this anesthetic. A teal colored band has been placed on your arm with the date, time and amount of EXPAREL you have received in order to alert and inform your health care providers. Please leave this armband in place for the full 96 hours following administration, and then you may remove the band.

## 2018-10-04 NOTE — Anesthesia Procedure Notes (Signed)
Anesthesia Procedure Image    

## 2018-10-04 NOTE — Op Note (Signed)
NAME: [place], [place] [id number] ACCOUNT [id number] DATE OF BIRTH:December 30, 1960 FACILITY: [facility] LOCATION: [facility] PHYSICIAN:W. Leighton Brickley JR., MD  OPERATIVE REPORT  DATE OF PROCEDURE:  10/04/2018  PREOPERATIVE DIAGNOSES: 1.  Partial rotator cuff tear. 2.  Degenerative tearing anterior, superior and posterior labrum. 3.  Impingement. 4.  Acromioclavicular arthritis.  OPERATION: 1.  Arthroscopic debridement (extensive). 2.  Arthroscopic acromioplasty. 3.  Arthroscopic distal clavicle excision, all for the right shoulder.  SURGEON:  Vangie Bicker, MD  ANESTHESIA:  General with block.  ESTIMATED BLOOD LOSS:  Minimal.  DESCRIPTION OF PROCEDURE:  Beach-chair position.  Examination under anesthesia.  Normal range of motion.  No instability.  Arthroscoped the posterior lateral and anterior portals.  Systematic inspection of the shoulder showed the patient had some mild  softening of the glenoid consistent with very early OA with relative sparing of the humeral head.  He had had a previous repair with some attenuation of the cuff, but no full-thickness tear appreciated.  Significant tearing of the anterior and superior  labrum.  Posterior labrum was debrided with no instability noted.  Subscap showed tendinopathy, but no tear.  Subacromial space was hypertrophied and inflamed with a significant amount of scarring consistent with previous surgery.  Residual impingement from the edge of the acromion was noted.  Performed an acromioplasty and release of the CA ligament.  Distal  clavicle was hypertrophied, extremely arthritic.  Excised about 1 cm to 1.5 cm of the distal clavicle.  Complete bursectomy was carried out.  Again, no full-thickness component appreciated.  Portals were closed with nylon, and the joint infiltrated with  Marcaine 4 to 5 mL with 80 mg Depo-Medrol.  Taken to recovery room in stable condition.  LN/NUANCE  D:10/04/2018 T:10/04/2018  JOB:[id number]

## 2018-10-04 NOTE — Anesthesia Procedure Notes (Addendum)
Procedure Name: Intubation Date/Time: 10/04/2018 12:29 PM Performed by: Bonney Aid, CRNA Pre-anesthesia Checklist: Patient identified, Emergency Drugs available, Suction available and Patient being monitored Patient Re-evaluated:Patient Re-evaluated prior to induction Oxygen Delivery Method: Circle system utilized Preoxygenation: Pre-oxygenation with 100% oxygen Induction Type: IV induction Ventilation: Mask ventilation without difficulty Laryngoscope Size: Mac and 4 Grade View: Grade I Tube type: Oral Tube size: 8.0 mm Number of attempts: 1 Airway Equipment and Method: Stylet and Oral airway Placement Confirmation: ETT inserted through vocal cords under direct vision,  positive ETCO2 and breath sounds checked- equal and bilateral Secured at: 23 cm Tube secured with: Tape Dental Injury: Teeth and Oropharynx as per pre-operative assessment

## 2018-10-04 NOTE — Interval H&P Note (Signed)
History and Physical Interval Note:  10/04/2018 12:15 PM  [name]  has presented today for surgery, with the diagnosis of RIGHT SHOULDER OSTEOARTHRITIS.  The various methods of treatment have been discussed with the patient and family. After consideration of risks, benefits and other options for treatment, the patient has consented to  Procedure(s) with comments: RIGHT SHOULDER ARTHROSCOPY WITH DISTAL CLAVICLE RESECTION, EXTENSIVE DEBRIDEMENT, SUBACROMIAL PARTIAL ACROMIOPLASTY, OPEN RESECTION DISTAL CLAVICAL (Right) - PRE/POST OP SCALENE as a surgical intervention.  The patient's history has been reviewed, patient examined, no change in status, stable for surgery.  I have reviewed the patient's chart and labs.  Questions were answered to the patient's satisfaction.     Yvette Rack

## 2018-10-04 NOTE — Transfer of Care (Signed)
Immediate Anesthesia Transfer of Care Note  Patient: [name]  Procedure(s) Performed: RIGHT SHOULDER ARTHROSCOPY WITH DISTAL CLAVICLE RESECTION, EXTENSIVE DEBRIDEMENT, SUBACROMIAL PARTIAL ACROMIOPLASTY, (Right Shoulder)  Patient Location: PACU  Anesthesia Type:General  Level of Consciousness: awake, alert  and oriented  Airway & Oxygen Therapy: Patient Spontanous Breathing and Patient connected to nasal cannula oxygen  Post-op Assessment: Report given to RN  Post vital signs: Reviewed and stable  Last Vitals: 97.7 Vitals Value Taken Time  BP 167/93 10/04/18 1350  Temp    Pulse 66 10/04/18 1352  Resp 19 10/04/18 1352  SpO2 100 % 10/04/18 1352  Vitals shown include unvalidated device data.  Last Pain:  Vitals:   10/04/18 0936  TempSrc: Oral  PainSc: 2       Patients Stated Pain Goal: 5 ([id number])  Complications: No apparent anesthesia complications

## 2018-10-04 NOTE — Anesthesia Preprocedure Evaluation (Signed)
Anesthesia Evaluation    Airway Mallampati: II  TM Distance: <3 FB Neck ROM: Limited    Dental no notable dental hx.    Pulmonary former smoker,  H/O PE   Pulmonary exam normal breath sounds clear to auscultation       Cardiovascular Normal cardiovascular exam Rhythm:Regular Rate:Normal     Neuro/Psych TIA   GI/Hepatic   Endo/Other  diabetes  Renal/GU      Musculoskeletal   Abdominal   Peds  Hematology   Anesthesia Other Findings   Reproductive/Obstetrics                             Anesthesia Physical Anesthesia Plan  ASA: III  Anesthesia Plan: General   Post-op Pain Management:  Regional for Post-op pain   Induction: Intravenous  PONV Risk Score and Plan: 2 and Ondansetron, Dexamethasone and Treatment may vary due to age or medical condition  Airway Management Planned: Oral ETT  Additional Equipment:   Intra-op Plan:   Post-operative Plan: Extubation in OR  Informed Consent: I have reviewed the patients History and Physical, chart, labs and discussed the procedure including the risks, benefits and alternatives for the proposed anesthesia with the patient or authorized representative who has indicated his/her understanding and acceptance.     Dental advisory given  Plan Discussed with: CRNA and Surgeon  Anesthesia Plan Comments:         Anesthesia Quick Evaluation

## 2018-10-04 NOTE — Anesthesia Procedure Notes (Signed)
Anesthesia Regional Block: Interscalene brachial plexus block   Pre-Anesthetic Checklist: ,, timeout performed, Correct Patient, Correct Site, Correct Laterality, Correct Procedure, Correct Position, site marked, Risks and benefits discussed,  Surgical consent,  Pre-op evaluation,  At surgeon's request and post-op pain management  Laterality: Right  Prep: chloraprep       Needles:  Injection technique: Single-shot  Needle Type: Echogenic Needle     Needle Length: 9cm      Additional Needles:   Procedures:,,,, ultrasound used (permanent image in chart),,,,  Narrative:  Start time: 10/04/2018 11:48 AM End time: 10/04/2018 11:56 AM Injection made incrementally with aspirations every 5 mL.  Performed by: Personally  Anesthesiologist: Myrtie Soman, MD  Additional Notes: Patient tolerated the procedure well without complications

## 2018-10-05 ENCOUNTER — Encounter (HOSPITAL_BASED_OUTPATIENT_CLINIC_OR_DEPARTMENT_OTHER): Payer: Self-pay | Admitting: Orthopedic Surgery

## 2018-10-05 NOTE — Anesthesia Postprocedure Evaluation (Signed)
Anesthesia Post Note  Patient: [name]  Procedure(s) Performed: RIGHT SHOULDER ARTHROSCOPY WITH DISTAL CLAVICLE RESECTION, EXTENSIVE DEBRIDEMENT, SUBACROMIAL PARTIAL ACROMIOPLASTY, (Right Shoulder)     Patient location during evaluation: PACU Anesthesia Type: General Level of consciousness: awake and alert Pain management: pain level controlled Vital Signs Assessment: post-procedure vital signs reviewed and stable Respiratory status: spontaneous breathing, nonlabored ventilation, respiratory function stable and patient connected to nasal cannula oxygen Cardiovascular status: blood pressure returned to baseline and stable Postop Assessment: no apparent nausea or vomiting Anesthetic complications: no    Last Vitals:  Vitals:   10/04/18 1445 10/04/18 1535  BP: (!) 161/88 (!) 153/86  Pulse: (!) 56 (!) 59  Resp: 20 16  Temp: 36.4 C (!) 36.4 C  SpO2: 100% 95%    Last Pain:  Vitals:   10/05/18 1549  TempSrc:   PainSc: 0-No pain                 Aylene Acoff S

## 2018-10-08 DIAGNOSIS — M25511 Pain in right shoulder: Secondary | ICD-10-CM | POA: Diagnosis not present

## 2018-10-15 DIAGNOSIS — M25511 Pain in right shoulder: Secondary | ICD-10-CM | POA: Diagnosis not present

## 2018-11-29 DIAGNOSIS — N184 Chronic kidney disease, stage 4 (severe): Secondary | ICD-10-CM | POA: Diagnosis not present

## 2018-11-29 DIAGNOSIS — D649 Anemia, unspecified: Secondary | ICD-10-CM | POA: Diagnosis not present

## 2018-11-29 DIAGNOSIS — Z79899 Other long term (current) drug therapy: Secondary | ICD-10-CM | POA: Diagnosis not present

## 2018-11-29 DIAGNOSIS — I129 Hypertensive chronic kidney disease with stage 1 through stage 4 chronic kidney disease, or unspecified chronic kidney disease: Secondary | ICD-10-CM | POA: Diagnosis not present

## 2018-11-29 DIAGNOSIS — R809 Proteinuria, unspecified: Secondary | ICD-10-CM | POA: Diagnosis not present

## 2018-11-29 DIAGNOSIS — E559 Vitamin D deficiency, unspecified: Secondary | ICD-10-CM | POA: Diagnosis not present

## 2018-12-01 DIAGNOSIS — E1122 Type 2 diabetes mellitus with diabetic chronic kidney disease: Secondary | ICD-10-CM | POA: Diagnosis not present

## 2018-12-01 DIAGNOSIS — E872 Acidosis: Secondary | ICD-10-CM | POA: Diagnosis not present

## 2018-12-01 DIAGNOSIS — E559 Vitamin D deficiency, unspecified: Secondary | ICD-10-CM | POA: Diagnosis not present

## 2018-12-01 DIAGNOSIS — N189 Chronic kidney disease, unspecified: Secondary | ICD-10-CM | POA: Diagnosis not present

## 2018-12-01 DIAGNOSIS — I129 Hypertensive chronic kidney disease with stage 1 through stage 4 chronic kidney disease, or unspecified chronic kidney disease: Secondary | ICD-10-CM | POA: Diagnosis not present

## 2018-12-10 DIAGNOSIS — E1165 Type 2 diabetes mellitus with hyperglycemia: Secondary | ICD-10-CM | POA: Diagnosis not present

## 2018-12-10 DIAGNOSIS — Z6832 Body mass index (BMI) 32.0-32.9, adult: Secondary | ICD-10-CM | POA: Diagnosis not present

## 2018-12-10 DIAGNOSIS — Z299 Encounter for prophylactic measures, unspecified: Secondary | ICD-10-CM | POA: Diagnosis not present

## 2018-12-10 DIAGNOSIS — I739 Peripheral vascular disease, unspecified: Secondary | ICD-10-CM | POA: Diagnosis not present

## 2018-12-10 DIAGNOSIS — S98112A Complete traumatic amputation of left great toe, initial encounter: Secondary | ICD-10-CM | POA: Diagnosis not present

## 2018-12-10 DIAGNOSIS — Z1211 Encounter for screening for malignant neoplasm of colon: Secondary | ICD-10-CM | POA: Diagnosis not present

## 2018-12-16 DIAGNOSIS — L97422 Non-pressure chronic ulcer of left heel and midfoot with fat layer exposed: Secondary | ICD-10-CM | POA: Diagnosis not present

## 2018-12-16 DIAGNOSIS — E1142 Type 2 diabetes mellitus with diabetic polyneuropathy: Secondary | ICD-10-CM | POA: Diagnosis not present

## 2018-12-20 ENCOUNTER — Other Ambulatory Visit (HOSPITAL_COMMUNITY)
Admission: RE | Admit: 2018-12-20 | Discharge: 2018-12-20 | Disposition: A | Discharge status: Home / Self Care | Payer: Medicare HMO | Source: Ambulatory Visit | Attending: Orthopedic Surgery | Admitting: Orthopedic Surgery

## 2018-12-20 ENCOUNTER — Other Ambulatory Visit: Payer: Self-pay | Admitting: Physician Assistant

## 2018-12-20 ENCOUNTER — Other Ambulatory Visit: Payer: Self-pay

## 2018-12-20 ENCOUNTER — Ambulatory Visit (INDEPENDENT_AMBULATORY_CARE_PROVIDER_SITE_OTHER): Payer: Medicare HMO | Admitting: Orthopedic Surgery

## 2018-12-20 ENCOUNTER — Encounter: Payer: Self-pay | Admitting: Orthopedic Surgery

## 2018-12-20 VITALS — Ht 77.0 in | Wt 257.7 lb

## 2018-12-20 DIAGNOSIS — Z20828 Contact with and (suspected) exposure to other viral communicable diseases: Secondary | ICD-10-CM | POA: Diagnosis not present

## 2018-12-20 DIAGNOSIS — Z01812 Encounter for preprocedural laboratory examination: Secondary | ICD-10-CM | POA: Insufficient documentation

## 2018-12-20 DIAGNOSIS — M86172 Other acute osteomyelitis, left ankle and foot: Secondary | ICD-10-CM | POA: Diagnosis not present

## 2018-12-20 DIAGNOSIS — E1142 Type 2 diabetes mellitus with diabetic polyneuropathy: Secondary | ICD-10-CM | POA: Diagnosis not present

## 2018-12-20 LAB — SARS CORONAVIRUS 2 (TAT 6-24 HRS): SARS Coronavirus 2: NEGATIVE

## 2018-12-20 NOTE — Progress Notes (Signed)
Office Visit Note   Patient: [name]           Date of Birth: [birth year]           MRN: [id number] Visit Date: 12/20/2018              Requested by: Arsenio Katz, NP [place],  [place] [address] PCP: Arsenio Katz, NP  Chief Complaint  Patient presents with  . Left Foot - Pain, New Patient (Initial Visit)    Left Heel ulcer      [id number] is a 58 year old gentleman seen in referral from Dr. Irving Shows.  Patient has uncontrolled type 2 diabetes with a ulcer over the left calcaneus.  Patient has undergone good conservative therapy with pressure unloading antibiotics and wound care and still has persistent ulceration.  Patient is currently on Cipro.   Assessment & Plan: Visit Diagnoses:  1. Acute osteomyelitis of left calcaneus [facility])     Plan: Patient will complete his course of antibiotics we will follow-up after the antibiotics are completed.  Discussed that with the ulcer extending to bone the best option is to proceed with partial calcaneal excision excision of the ulcer and wound closure.  Discussed that its highly unlikely to resolve the calcaneal infection with antibiotics.  We will reevaluate at follow-up discussed that we could proceed with outpatient surgery.  Follow-Up Instructions: Return in about 3 weeks (around 01/10/2019).   Ortho Exam  Patient is alert, oriented, no adenopathy, well-dressed, normal affect, normal respiratory effort.  Examination patient has a good dorsalis pedis pulse.  Patient has a 5 mm ulcer without cellulitis on the plantar aspect the left calcaneus.  This probes 2 cm down to the calcaneus.  There is no ascending cellulitis no purulent drainage no signs of abscess.  Imaging: No results found. No images are attached to the encounter.   Labs: Lab Results  Component Value Date   HGBA1C 8.4 (H) 03/15/2017   HGBA1C 8.9 (H) 12/24/2016   HGBA1C 7.8 (H) 09/27/2015   REPTSTATUS 06/07/2013 FINAL 06/02/2013   REPTSTATUS 06/04/2013  FINAL 06/02/2013   GRAMSTAIN  06/02/2013    RARE WBC PRESENT,BOTH PMN AND MONONUCLEAR NO SQUAMOUS EPITHELIAL CELLS SEEN NO ORGANISMS SEEN Performed at [facility]  06/02/2013    RARE WBC PRESENT,BOTH PMN AND MONONUCLEAR NO SQUAMOUS EPITHELIAL CELLS SEEN NO ORGANISMS SEEN Performed at [facility]   CULT  06/02/2013    NO ANAEROBES ISOLATED Performed at [place]  06/02/2013    NO GROWTH 2 DAYS Performed at [facility]     Lab Results  Component Value Date   ALBUMIN 4.0 04/06/2018   ALBUMIN 4.3 10/30/2017   ALBUMIN 4.3 03/13/2017    No results found for: MG No results found for: VD25OH  No results found for: PREALBUMIN CBC EXTENDED Latest Ref Rng & Units 10/04/2018 04/06/2018 10/30/2017  WBC 4.0 - 10.5 K/uL - 11.9(H) 10.6(H)  RBC 4.22 - 5.81 MIL/uL - 5.32 4.70  HGB 13.0 - 17.0 g/dL 13.6 14.9 13.6  HCT 39.0 - 52.0 % 40.0 45.0 40.3  PLT 150 - 400 K/uL - 162 185  NEUTROABS 1.7 - 7.7 K/uL - 11.1(H) -  LYMPHSABS 0.7 - 4.0 K/uL - 0.3(L) -     Body mass index is 30.56 kg/m.  Orders:  No orders of the defined types were placed in this encounter.  No orders of the defined types were placed in this encounter.  Procedures: No procedures performed  Clinical Data: No additional findings.  ROS:  All other systems negative, except as noted in the HPI. Review of Systems  Objective: Vital Signs: Ht 6\' 5"  (1.956 m)   Wt 257 lb 11.2 oz (116.9 kg)   BMI 30.56 kg/m   Specialty Comments:  No specialty comments available.  PMFS History: Patient Active Problem List   Diagnosis Date Noted  . Gastroesophageal reflux disease without esophagitis 11/09/2017  . Abdominal pain, chronic, epigastric 11/09/2017  . Numbness   . Syncope 03/13/2017  . H/O cervical spine surgery 03/13/2017  . Cervical spondylosis with radiculopathy 12/29/2016  . CVA (cerebral vascular accident) ([place]) 08/30/2014  . Diabetes with skin  complication ([place]) 123XX123  . Neuropathy 08/30/2014  . AKI (acute kidney injury) ([place]) 08/30/2014  . Bilateral occipital neuralgia 10/30/2013  . Lumbar stenosis without neurogenic claudication 01/12/2013  . Type 2 diabetes mellitus with vascular disease ([place])   . Hypertension   . Hyperlipidemia   . Gastroparesis   . Pulmonary embolus ([place]) 2012   . Obesity   . GERD 01/14/2010  . Chest pain 10/05/2003   Past Medical History:  Diagnosis Date  . Allergic rhinitis, cause unspecified   . BPH (benign prostatic hyperplasia)   . Cervicalgia   . Chest pain Sept. 2005   multiple caths  /  cath 06/15/2010..normal coronaries,  EF 60%   (false postive nuclear in the past)  . CKD (chronic kidney disease), stage III   . Complication of anesthesia    Hard time waking up patient stated low blood pressures  . Dyslipidemia    mixed  . Esophageal reflux   . Fibromyalgia   . Gastroparesis   . Headache(784.0)   . History of kidney stones   . Hyperlipidemia   . Hypertension   . Hypoplasia of one kidney   . IDDM (insulin dependent diabetes mellitus)   . Lumbago   . Neuropathy associated with endocrine disorder ([place])   . Overweight(278.02)   . Peripheral vascular disease ([place])   . Pulmonary embolus (HCC)    Hx of small left lower lobe  pulmonary embolus  . Pulmonary embolus ([place]) 2010ish  . Stroke [facility])    mini stroke aug 2016  . Type II or unspecified type diabetes mellitus with neurological manifestations, not stated as uncontrolled(250.60)   . Urticaria, unspecified   . Wears glasses     Family History  Problem Relation Age of Onset  . Heart attack Father 44  . Hyperlipidemia Father   . Hypertension Father   . Heart attack Mother 27  . Diabetes Mother   . Hypertension Mother   . Hyperlipidemia Mother   . Healthy Brother   . Seizures Brother   . Migraines Neg Hx     Past Surgical History:  Procedure Laterality Date  . ACHILLES TENDON REPAIR Left 2013  . AMPUTATION Left  06/02/2013   Procedure: AMPUTATION 1ST TOE LEFT FOOT;  Surgeon: Marcheta Grammes, DPM;  Location: [facility];  Service: Orthopedics;  Laterality: Left;  . AMPUTATION Left 07/21/2013   Procedure: PARTIAL AMPUTATION 2ND TOE LEFT FOOT;  Surgeon: Marcheta Grammes, DPM;  Location: [facility];  Service: Podiatry;  Laterality: Left;  . ANTERIOR CERVICAL DECOMP/DISCECTOMY FUSION N/A 12/29/2016   Procedure: Cervical five-six Cervical six-seven Anterior cervical discectomy with fusion and plate fixation;  Surgeon: Ditty, Kevan Ny, MD;  Location: [place];  Service: Neurosurgery;  Laterality: N/A;  . APPENDECTOMY    . BACK SURGERY  1999   x3  . BIOPSY  12/18/2017   Procedure: BIOPSY;  Surgeon: Rogene Houston, MD;  [facility];  Service: Endoscopy;;  gastric and esophagus  . ESOPHAGOGASTRODUODENOSCOPY (EGD) WITH PROPOFOL N/A 12/18/2017   Procedure: ESOPHAGOGASTRODUODENOSCOPY (EGD) WITH PROPOFOL;  Surgeon: Rogene Houston, MD;  [facility];  Service: Endoscopy;  Laterality: N/A;  9:25  . FOOT ARTHRODESIS Right 01/11/2014   Procedure: ARTHRODESIS INTERPHALANGEAL JOINT HALLUX RIGHT FOOT;  Surgeon: Marcheta Grammes, DPM;  Location: [facility];  Service: Podiatry;  Laterality: Right;  . HARDWARE REMOVAL Right 01/17/2015   Procedure: HARDWARE REMOVAL;  Surgeon: Caprice Beaver, DPM;  Location: [facility];  Service: Podiatry;  Laterality: Right;  . KNEE ARTHROSCOPY Right 10/2015  . LUMBAR LAMINECTOMY/DECOMPRESSION MICRODISCECTOMY Right 01/12/2013   Procedure: Right Lumbar Three-Four Laminotomy/Foraminotomy;  Surgeon: Floyce Stakes, MD;  [facility];  Service: Neurosurgery;  Laterality: Right;  Right Lumbar Three-Four Laminotomy/Foraminotomy  . NISSEN FUNDOPLICATION    . SHOULDER ARTHROSCOPY W/ ROTATOR CUFF REPAIR Right   . SHOULDER ARTHROSCOPY WITH DISTAL CLAVICLE RESECTION Right 10/04/2018   Procedure: RIGHT SHOULDER ARTHROSCOPY WITH DISTAL CLAVICLE RESECTION,  EXTENSIVE DEBRIDEMENT, SUBACROMIAL PARTIAL ACROMIOPLASTY,;  Surgeon: Earlie Server, MD;  Location: [place];  Service: Orthopedics;  Laterality: Right;  PRE/POST OP SCALENE   Social History   Occupational History  . Occupation: Retired- Research officer, trade union  Tobacco Use  . Smoking status: Former Smoker    Packs/day: 3.00    Years: 5.00    Pack years: 15.00    Types: Cigarettes    Start date: 06/20/1977    Quit date: 07/20/1982    Years since quitting: 36.4  . Smokeless tobacco: Never Used  . Tobacco comment: quit 1980's  Substance and Sexual Activity  . Alcohol use: No    Alcohol/week: 0.0 standard drinks  . Drug use: No  . Sexual activity: Yes    Birth control/protection: None

## 2018-12-21 ENCOUNTER — Encounter (HOSPITAL_COMMUNITY): Payer: Self-pay | Admitting: *Deleted

## 2018-12-21 ENCOUNTER — Other Ambulatory Visit: Payer: Self-pay

## 2018-12-21 NOTE — Progress Notes (Signed)
Anesthesia Chart Review: SAME DAY WORK-UP   Case: [id number] Date/Time: 12/22/18 1036   Procedure: LEFT PARTIAL CALCANEAL EXCISION (Left )   Anesthesia type: Choice   Pre-op diagnosis: Osteomyelitis Left Calcaneous   Location: [facility] OR ROOM 03 / [place] OR   Surgeon: Newt Minion, MD      DISCUSSION: Patient is a 58 year old male scheduled for the above procedure.  History includes former smoker (quit 1984), HTN, DM2 (with neuropathy, gastroparesis), dyslipidemia, PE (~ 2010), PVD with osteomyelitis (s/p left 1st, 2nd toe amputations, 2015), CVA ("mini stroke", 2016), GERD, CKD (stage 3B, 12/01/18), L4-5 diskectomy/posterolateral fusion 01/12/02, C5-7 ACDF 12/29/16, right shoulder arthroscopy for rotator cuff repair 10/04/18, . 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  Last seen by cardiologist Dr. Bronson Ing on 04/02/18. 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 He felt Ranexa could be prescribed through his PCP and only as needed cardiology follow-up. Patient reported no further chest pain since on Ranexa.   Nephrology notes indicate that in 2020, Creatinine has been ~ 2.0-2.5 (peak in July) and in 2019 ~ 1.66-1.83. Cr 10/04/18 was 1.90.   He is a same day work-up, so labs and anesthesia team evaluation on arrival. 12/20/18 COVID-19 test negative.    PROVIDERS: Arsenio Katz, NP is PCP Kate Sable, MD is cardiologist. Last visit 04/02/18 with PRN follow-up. Ulice Bold, MD is nephrologist [facility], see Care Everywhere). Last visit 12/01/18.    LABS: For updated labs on the day of surgery. Known CKD, with most recent Creatinine ~ 2.0. Reported last A1c ~ 8% with home CBGs ~ 120-150's.   EKG: 04/06/18: ST at 105 bpm   CV: Cardiac event monitor 03/30/17-04/28/17: Study Highlights  Sinus rhythm with rare PAC's and one 6-beat run of non-sustained ventricular  tachycardia. (If symptomatic of palpitations, consider beta-blocker, otherwise no change in therapy recommended Ahmed Prima, [place], PA-C 03/31/17)   Echo 03/14/17: Study Conclusions - Left ventricle: Inferobasal hypokinesis The cavity size was   normal. There was mild focal basal hypertrophy of the septum.   Systolic function was normal. The estimated ejection fraction was   in the range of 55% to 60%. Wall motion was normal; there were no   regional wall motion abnormalities. Doppler parameters are   consistent with abnormal left ventricular relaxation (grade 1   diastolic dysfunction). - Mitral valve: Valve area by pressure half-time: 1.55 cm^2. - Left atrium: The atrium was mildly dilated. (Comparison 08/31/14: LVEF 60-65%, inferior basal hypokinesis)   Nuclear stress test 01/10/15:   There was no ST segment deviation noted during stress.  This is a low risk study. No ischemia or myocardial scar.  Nuclear stress EF: 54%. Normal regional wall motion.  Defect 1: There is a defect present in the basal inferior, basal inferolateral and mid inferior location, consistent with soft tissue attenuation artifact.   Carotid duplex 08/31/14:  Impression: Minimal amount of bilateral atherosclerotic plaque, left subjectively greater than right, not resulting in a hemodynamically significant stenosis.   Cardiac cath 07/02/10:  Final Interpretation: 1. No significant obstructive atherosclerotic coronary artery disease (OM3 up to 40% stenosis). 2. Normal left ventricular function. EF 60%.   Past Medical History:  Diagnosis Date  . Allergic rhinitis, cause unspecified   . Anginal pain ([place])   . Arthritis   . BPH (benign prostatic hyperplasia)   . Cervicalgia   . Chest pain Sept. 2005   multiple caths  /  cath 06/15/2010..normal coronaries,  EF 60%   (false postive nuclear in the past)  . CKD (chronic kidney disease), stage III   . Complication of anesthesia    Hard time waking up  patient stated low blood pressures  . Dyslipidemia    mixed  . Esophageal reflux   . Fibromyalgia   . Gastroparesis   . Headache(784.0)   . History of kidney stones   . Hyperlipidemia   . Hypertension   . Hypoplasia of one kidney   . IDDM (insulin dependent diabetes mellitus)   . Lumbago   . Neuropathy associated with endocrine disorder ([place])   . Overweight(278.02)   . Peripheral vascular disease ([place])   . Pulmonary embolus (HCC)    Hx of small left lower lobe  pulmonary embolus  . Pulmonary embolus ([place]) 2010ish  . Stroke [facility])    mini stroke aug 2016  . Type II or unspecified type diabetes mellitus with neurological manifestations, not stated as uncontrolled(250.60)   . Urticaria, unspecified   . Wears glasses     Past Surgical History:  Procedure Laterality Date  . ACHILLES TENDON REPAIR Left 2013  . AMPUTATION Left 06/02/2013   Procedure: AMPUTATION 1ST TOE LEFT FOOT;  Surgeon: Marcheta Grammes, DPM;  [facility];  Service: Orthopedics;  Laterality: Left;  . AMPUTATION Left 07/21/2013   Procedure: PARTIAL AMPUTATION 2ND TOE LEFT FOOT;  Surgeon: Marcheta Grammes, DPM;  [facility]: [facility];  Service: Podiatry;  Laterality: Left;  . ANTERIOR CERVICAL DECOMP/DISCECTOMY FUSION N/A 12/29/2016   Procedure: Cervical five-six Cervical six-seven Anterior cervical discectomy with fusion and plate fixation;  Surgeon: Ditty, Kevan Ny, MD;  Location: [place];  Service: Neurosurgery;  Laterality: N/A;  . APPENDECTOMY    . BACK SURGERY  1999   x 6 some fusions  . BIOPSY  12/18/2017   Procedure: BIOPSY;  Surgeon: Rogene Houston, MD;  [facility];  Service: Endoscopy;;  gastric and esophagus  . colonscopy    . ESOPHAGOGASTRODUODENOSCOPY (EGD) WITH PROPOFOL N/A 12/18/2017   Procedure: ESOPHAGOGASTRODUODENOSCOPY (EGD) WITH PROPOFOL;  Surgeon: Rogene Houston, MD;  [facility];  Service: Endoscopy;  Laterality: N/A;  9:25  . FOOT ARTHRODESIS Right  01/11/2014   Procedure: ARTHRODESIS INTERPHALANGEAL JOINT HALLUX RIGHT FOOT;  Surgeon: Marcheta Grammes, DPM;  [facility]: [facility];  Service: Podiatry;  Laterality: Right;  . HARDWARE REMOVAL Right 01/17/2015   Procedure: HARDWARE REMOVAL;  Surgeon: Caprice Beaver, DPM;  Location: [facility];  Service: Podiatry;  Laterality: Right;  . KNEE ARTHROSCOPY Right 10/2015  . LUMBAR LAMINECTOMY/DECOMPRESSION MICRODISCECTOMY Right 01/12/2013   Procedure: Right Lumbar Three-Four Laminotomy/Foraminotomy;  Surgeon: Floyce Stakes, MD;  [facility];  Service: Neurosurgery;  Laterality: Right;  Right Lumbar Three-Four Laminotomy/Foraminotomy  . NISSEN FUNDOPLICATION    . SHOULDER ARTHROSCOPY W/ ROTATOR CUFF REPAIR Right   . SHOULDER ARTHROSCOPY WITH DISTAL CLAVICLE RESECTION Right 10/04/2018   Procedure: RIGHT SHOULDER ARTHROSCOPY WITH DISTAL CLAVICLE RESECTION, EXTENSIVE DEBRIDEMENT, SUBACROMIAL PARTIAL ACROMIOPLASTY,;  Surgeon: Earlie Server, MD;  Location: [place];  Service: Orthopedics;  Laterality: Right;  PRE/POST OP SCALENE    MEDICATIONS: No current facility-administered medications for this encounter.    [name] aspirin EC 81 MG tablet  . atorvastatin (LIPITOR) 40 MG tablet  . ciprofloxacin (CIPRO) 500 MG tablet  . empagliflozin (JARDIANCE) 10 MG TABS tablet  . EPINEPHrine 0.3 mg/0.3 mL IJ SOAJ injection  . ferrous sulfate 325 (65 FE) MG tablet  .  gabapentin (NEURONTIN) 600 MG tablet  . glipiZIDE (GLUCOTROL) 10 MG tablet  . LEVEMIR 100 UNIT/ML injection  . omega-3 acid ethyl esters (LOVAZA) 1 g capsule  . ranolazine (RANEXA) 1000 MG SR tablet  . sodium bicarbonate 650 MG tablet  . metFORMIN (GLUCOPHAGE) 500 MG tablet  . methocarbamol (ROBAXIN) 500 MG tablet  . oxyCODONE (OXY IR/ROXICODONE) 5 MG immediate release tablet   Not currently taking metformin (recommended d/c per nephrology), Oxy IR, Robaxin.   Myra Gianotti, PA-C Surgical Short  Stay/Anesthesiology [facility] Phone [phone] [facility] Phone [phone] 12/21/2018 1:19 PM

## 2018-12-21 NOTE — Anesthesia Preprocedure Evaluation (Addendum)
Anesthesia Evaluation  Patient identified by MRN, date of birth, ID band Patient awake    Reviewed: Allergy & Precautions, H&P , NPO status , Patient's Chart, lab work & pertinent test results, reviewed documented beta blocker date and time   History of Anesthesia Complications (+) PROLONGED EMERGENCE and history of anesthetic complications  Airway Mallampati: I  TM Distance: >3 FB Neck ROM: full    Dental no notable dental hx. (+) Teeth Intact, Dental Advisory Given   Pulmonary neg pulmonary ROS, former smoker,    Pulmonary exam normal breath sounds clear to auscultation       Cardiovascular Exercise Tolerance: Good hypertension, + angina + Peripheral Vascular Disease  negative cardio ROS   Rhythm:regular Rate:Normal  EKG: 04/06/18: ST at 105 bpm  Cardiac event monitor 03/30/17-04/28/17: Study Highlights  Sinus rhythm with rare PAC's and one 6-beat run of non-sustained ventricular tachycardia. (If symptomatic of palpitations, consider beta-blocker, otherwise no change in therapy recommended Ahmed Prima, [place], PA-C 03/31/17)    Echo 03/14/17: - Left ventricle: Inferobasal hypokinesis The cavity size was normal. There was mild focal basal hypertrophy of the septum. Systolic function was normal. The estimated ejection fraction was in the range of 55% to 60%. Wall motion was normal; there were no regional wall motion abnormalities. Doppler parameters are consistent with abnormal left ventricular relaxation (grade 1 diastolic dysfunction). - Mitral valve: Valve area by pressure half-time: 1.55 cm^2. - Left atrium: The atrium was mildly dilated. (Comparison 08/31/14: LVEF 60-65%, inferior basal hypokinesis)   Nuclear stress test 01/10/15:  There was no ST segment deviation noted during stress.  This is a low risk study. No ischemia or myocardial scar.  Nuclear stress EF: 54%. Normal regional wall  motion.  Defect 1: There is a defect present in the basal inferior, basal inferolateral and mid inferior location, consistent with soft tissue attenuation artifact.  Cardiac cath 07/02/10: Final Interpretation: 1. No significant obstructive atherosclerotic coronary artery disease(OM3 up to 40% stenosis). 2. Normal left ventricular function. EF 60%.   Neuro/Psych  Headaches,  Neuromuscular disease CVA negative neurological ROS  negative psych ROS   GI/Hepatic negative GI ROS, Neg liver ROS, GERD  ,  Endo/Other  diabetes, Type 2Morbid obesity  Renal/GU Renal diseasenegative Renal ROS  negative genitourinary   Musculoskeletal negative musculoskeletal ROS (+) Arthritis , Osteoarthritis,  Fibromyalgia -, narcotic dependent  Abdominal (+) + obese,   Peds negative pediatric ROS (+)  Hematology negative hematology ROS (+)   Anesthesia Other Findings   Reproductive/Obstetrics negative OB ROS                           Anesthesia Physical Anesthesia Plan  ASA: III  Anesthesia Plan: General   Post-op Pain Management:    Induction: Intravenous  PONV Risk Score and Plan: 2 and Treatment may vary due to age or medical condition, Dexamethasone and Ondansetron  Airway Management Planned: Oral ETT and LMA  Additional Equipment:   Intra-op Plan:   Post-operative Plan: Extubation in OR  Informed Consent: I have reviewed the patients History and Physical, chart, labs and discussed the procedure including the risks, benefits and alternatives for the proposed anesthesia with the patient or authorized representative who has indicated his/her understanding and acceptance.     Dental Advisory Given  Plan Discussed with: CRNA, Anesthesiologist and Surgeon  Anesthesia Plan Comments: (PAT note written 12/21/2018 by Myra Gianotti, PA-C. )       Anesthesia Quick Evaluation

## 2018-12-21 NOTE — Progress Notes (Addendum)
Mr. Hagey denies chest pain or shortness of breath. Patient states that he has not had chest pain since starting on Ranexa. Mr Bautista tested negative for Covid 11/16 and has been in quarantine with his wife since that time. Mr Boehringer has type II diabetes. Patient states that last  A1C was 8, CBG's run 120 - 152 (once). I instructed Mr Pringle to not take Jardiance today or in the am. I instructed patient to take 33 units of Levemir at bedtime tonight. I instructed patient to not take Glipiaide in am.  I instructed patient to not eat food after midnight.   I instructed patient that he may drink clear liquids until 0715; we reviewed what clear liquids, patient drinks soda and water.  Mr Wielenga states that he saw his kidney with in the last couple of week and that this working kidney is working at [employer], I requested labs and office notes from [employer], [place].

## 2018-12-22 ENCOUNTER — Encounter (HOSPITAL_COMMUNITY): Payer: Self-pay | Admitting: Orthopedic Surgery

## 2018-12-22 ENCOUNTER — Ambulatory Visit (HOSPITAL_COMMUNITY): Payer: Medicare HMO | Admitting: Vascular Surgery

## 2018-12-22 ENCOUNTER — Ambulatory Visit (HOSPITAL_COMMUNITY)
Admission: RE | Admit: 2018-12-22 | Discharge: 2018-12-22 | Disposition: A | Discharge status: Home / Self Care | Payer: Medicare HMO | Attending: Orthopedic Surgery | Admitting: Orthopedic Surgery

## 2018-12-22 ENCOUNTER — Encounter (HOSPITAL_COMMUNITY)
Admission: RE | Disposition: A | Discharge status: Home / Self Care | Payer: Self-pay | Source: Home / Self Care | Attending: Orthopedic Surgery

## 2018-12-22 DIAGNOSIS — M86172 Other acute osteomyelitis, left ankle and foot: Secondary | ICD-10-CM | POA: Diagnosis not present

## 2018-12-22 DIAGNOSIS — L97426 Non-pressure chronic ulcer of left heel and midfoot with bone involvement without evidence of necrosis: Secondary | ICD-10-CM | POA: Insufficient documentation

## 2018-12-22 DIAGNOSIS — Z86711 Personal history of pulmonary embolism: Secondary | ICD-10-CM | POA: Diagnosis not present

## 2018-12-22 DIAGNOSIS — E1165 Type 2 diabetes mellitus with hyperglycemia: Secondary | ICD-10-CM | POA: Insufficient documentation

## 2018-12-22 DIAGNOSIS — Z7984 Long term (current) use of oral hypoglycemic drugs: Secondary | ICD-10-CM | POA: Diagnosis not present

## 2018-12-22 DIAGNOSIS — K3184 Gastroparesis: Secondary | ICD-10-CM | POA: Diagnosis not present

## 2018-12-22 DIAGNOSIS — E782 Mixed hyperlipidemia: Secondary | ICD-10-CM | POA: Insufficient documentation

## 2018-12-22 DIAGNOSIS — Z79899 Other long term (current) drug therapy: Secondary | ICD-10-CM | POA: Diagnosis not present

## 2018-12-22 DIAGNOSIS — E1122 Type 2 diabetes mellitus with diabetic chronic kidney disease: Secondary | ICD-10-CM | POA: Insufficient documentation

## 2018-12-22 DIAGNOSIS — M868X7 Other osteomyelitis, ankle and foot: Secondary | ICD-10-CM | POA: Insufficient documentation

## 2018-12-22 DIAGNOSIS — M797 Fibromyalgia: Secondary | ICD-10-CM | POA: Diagnosis not present

## 2018-12-22 DIAGNOSIS — N400 Enlarged prostate without lower urinary tract symptoms: Secondary | ICD-10-CM | POA: Insufficient documentation

## 2018-12-22 DIAGNOSIS — E11628 Type 2 diabetes mellitus with other skin complications: Secondary | ICD-10-CM

## 2018-12-22 DIAGNOSIS — Z8673 Personal history of transient ischemic attack (TIA), and cerebral infarction without residual deficits: Secondary | ICD-10-CM | POA: Insufficient documentation

## 2018-12-22 DIAGNOSIS — M1990 Unspecified osteoarthritis, unspecified site: Secondary | ICD-10-CM | POA: Insufficient documentation

## 2018-12-22 DIAGNOSIS — Z87891 Personal history of nicotine dependence: Secondary | ICD-10-CM | POA: Diagnosis not present

## 2018-12-22 DIAGNOSIS — E1143 Type 2 diabetes mellitus with diabetic autonomic (poly)neuropathy: Secondary | ICD-10-CM | POA: Diagnosis not present

## 2018-12-22 DIAGNOSIS — L97424 Non-pressure chronic ulcer of left heel and midfoot with necrosis of bone: Secondary | ICD-10-CM

## 2018-12-22 DIAGNOSIS — I129 Hypertensive chronic kidney disease with stage 1 through stage 4 chronic kidney disease, or unspecified chronic kidney disease: Secondary | ICD-10-CM | POA: Insufficient documentation

## 2018-12-22 DIAGNOSIS — E1169 Type 2 diabetes mellitus with other specified complication: Secondary | ICD-10-CM | POA: Diagnosis not present

## 2018-12-22 DIAGNOSIS — E11621 Type 2 diabetes mellitus with foot ulcer: Secondary | ICD-10-CM | POA: Diagnosis not present

## 2018-12-22 DIAGNOSIS — K219 Gastro-esophageal reflux disease without esophagitis: Secondary | ICD-10-CM | POA: Diagnosis not present

## 2018-12-22 DIAGNOSIS — Z7982 Long term (current) use of aspirin: Secondary | ICD-10-CM | POA: Insufficient documentation

## 2018-12-22 DIAGNOSIS — N1832 Chronic kidney disease, stage 3b: Secondary | ICD-10-CM | POA: Diagnosis not present

## 2018-12-22 DIAGNOSIS — N183 Chronic kidney disease, stage 3 unspecified: Secondary | ICD-10-CM | POA: Diagnosis not present

## 2018-12-22 HISTORY — DX: Angina pectoris, unspecified: I20.9

## 2018-12-22 HISTORY — PX: I & D EXTREMITY: SHX5045

## 2018-12-22 HISTORY — DX: Unspecified osteoarthritis, unspecified site: M19.90

## 2018-12-22 LAB — BASIC METABOLIC PANEL
Anion gap: 12 (ref 5–15)
BUN: 31 mg/dL — ABNORMAL HIGH (ref 6–20)
CO2: 19 mmol/L — ABNORMAL LOW (ref 22–32)
Calcium: 9.3 mg/dL (ref 8.9–10.3)
Chloride: 108 mmol/L (ref 98–111)
Creatinine, Ser: 2.23 mg/dL — ABNORMAL HIGH (ref 0.61–1.24)
GFR calc Af Amer: 36 mL/min — ABNORMAL LOW (ref >60)
GFR calc non Af Amer: 31 mL/min — ABNORMAL LOW (ref >60)
Glucose, Bld: 135 mg/dL — ABNORMAL HIGH (ref 70–99)
Potassium: 4.3 mmol/L (ref 3.5–5.1)
Sodium: 139 mmol/L (ref 135–145)

## 2018-12-22 LAB — GLUCOSE, CAPILLARY
Glucose-Capillary: 126 mg/dL — ABNORMAL HIGH (ref 70–99)
Glucose-Capillary: 123 mg/dL — ABNORMAL HIGH (ref 70–99)

## 2018-12-22 LAB — CBC
HCT: 42.3 % (ref 39.0–52.0)
Hemoglobin: 12.7 g/dL — ABNORMAL LOW (ref 13.0–17.0)
MCH: 26.5 pg (ref 26.0–34.0)
MCHC: 30 g/dL (ref 30.0–36.0)
MCV: 88.1 fL (ref 80.0–100.0)
Platelets: 236 10*3/uL (ref 150–400)
RBC: 4.8 MIL/uL (ref 4.22–5.81)
RDW: 14.3 % (ref 11.5–15.5)
WBC: 6.7 10*3/uL (ref 4.0–10.5)
nRBC: 0 % (ref 0.0–0.2)

## 2018-12-22 SURGERY — IRRIGATION AND DEBRIDEMENT EXTREMITY
Anesthesia: General | Site: Foot | Laterality: Left

## 2018-12-22 MED ORDER — PROPOFOL 10 MG/ML IV BOLUS
INTRAVENOUS | Status: AC
  Filled 2018-12-22: qty 20

## 2018-12-22 MED ORDER — ONDANSETRON HCL 4 MG/2ML IJ SOLN: 4.0000 mg | Freq: Once | INTRAMUSCULAR | Status: DC | PRN

## 2018-12-22 MED ORDER — SODIUM CHLORIDE 0.9 % IV SOLN
INTRAVENOUS | Status: DC
  Administered 2018-12-22: 09:00:00 via INTRAVENOUS

## 2018-12-22 MED ORDER — LIDOCAINE 2% (20 MG/ML) 5 ML SYRINGE
INTRAMUSCULAR | Status: AC
  Filled 2018-12-22: qty 5

## 2018-12-22 MED ORDER — ACETAMINOPHEN 325 MG PO TABS: 325.0000 mg | ORAL_TABLET | ORAL | Status: DC | PRN

## 2018-12-22 MED ORDER — FENTANYL CITRATE (PF) 250 MCG/5ML IJ SOLN
INTRAMUSCULAR | Status: AC
  Filled 2018-12-22: qty 5

## 2018-12-22 MED ORDER — ACETAMINOPHEN 160 MG/5ML PO SOLN: 325.0000 mg | ORAL | Status: DC | PRN

## 2018-12-22 MED ORDER — CEFAZOLIN SODIUM-DEXTROSE 2-4 GM/100ML-% IV SOLN
2.0000 g | INTRAVENOUS | Status: AC
  Administered 2018-12-22: 2 g via INTRAVENOUS
  Filled 2018-12-22: qty 100

## 2018-12-22 MED ORDER — 0.9 % SODIUM CHLORIDE (POUR BTL) OPTIME
TOPICAL | Status: DC | PRN
  Administered 2018-12-22: 1000 mL

## 2018-12-22 MED ORDER — FENTANYL CITRATE (PF) 250 MCG/5ML IJ SOLN
INTRAMUSCULAR | Status: DC | PRN
  Administered 2018-12-22: 25 ug via INTRAVENOUS

## 2018-12-22 MED ORDER — FENTANYL CITRATE (PF) 100 MCG/2ML IJ SOLN: 25.0000 ug | INTRAMUSCULAR | Status: DC | PRN

## 2018-12-22 MED ORDER — MIDAZOLAM HCL 2 MG/2ML IJ SOLN
INTRAMUSCULAR | Status: AC
  Filled 2018-12-22: qty 2

## 2018-12-22 MED ORDER — PROPOFOL 10 MG/ML IV BOLUS
INTRAVENOUS | Status: DC | PRN
  Administered 2018-12-22: 150 mg via INTRAVENOUS
  Administered 2018-12-22: 50 mg via INTRAVENOUS

## 2018-12-22 MED ORDER — LIDOCAINE HCL (CARDIAC) PF 100 MG/5ML IV SOSY
PREFILLED_SYRINGE | INTRAVENOUS | Status: DC | PRN
  Administered 2018-12-22: 60 mg via INTRATRACHEAL

## 2018-12-22 MED ORDER — OXYCODONE HCL 5 MG PO TABS: 5.0000 mg | ORAL_TABLET | Freq: Once | ORAL | Status: DC | PRN

## 2018-12-22 MED ORDER — ONDANSETRON HCL 4 MG/2ML IJ SOLN
INTRAMUSCULAR | Status: DC | PRN
  Administered 2018-12-22: 4 mg via INTRAVENOUS

## 2018-12-22 MED ORDER — OXYCODONE-ACETAMINOPHEN 5-325 MG PO TABS: 1.0000 | ORAL_TABLET | ORAL | 0 refills | Status: DC | PRN

## 2018-12-22 MED ORDER — CHLORHEXIDINE GLUCONATE 4 % EX LIQD: 60.0000 mL | Freq: Once | CUTANEOUS | Status: DC

## 2018-12-22 MED ORDER — ONDANSETRON HCL 4 MG/2ML IJ SOLN
INTRAMUSCULAR | Status: AC
  Filled 2018-12-22: qty 2

## 2018-12-22 MED ORDER — OXYCODONE HCL 5 MG/5ML PO SOLN: 5.0000 mg | Freq: Once | ORAL | Status: DC | PRN

## 2018-12-22 MED ORDER — MIDAZOLAM HCL 2 MG/2ML IJ SOLN
INTRAMUSCULAR | Status: DC | PRN
  Administered 2018-12-22: 2 mg via INTRAVENOUS

## 2018-12-22 MED ORDER — MEPERIDINE HCL 25 MG/ML IJ SOLN: 6.2500 mg | INTRAMUSCULAR | Status: DC | PRN

## 2018-12-22 SURGICAL SUPPLY — 31 items
BLADE SURG 21 STRL SS (BLADE) ×2 IMPLANT
BNDG COHESIVE 4X5 TAN STRL (GAUZE/BANDAGES/DRESSINGS) ×1 IMPLANT
BNDG COHESIVE 6X5 TAN STRL LF (GAUZE/BANDAGES/DRESSINGS) IMPLANT
BNDG GAUZE ELAST 4 BULKY (GAUZE/BANDAGES/DRESSINGS) ×2 IMPLANT
COVER SURGICAL LIGHT HANDLE (MISCELLANEOUS) ×3 IMPLANT
COVER WAND RF STERILE (DRAPES) ×2 IMPLANT
DRAPE U-SHAPE 47X51 STRL (DRAPES) ×2 IMPLANT
DRESSING PREVENA PLUS CUSTOM (GAUZE/BANDAGES/DRESSINGS) IMPLANT
DRSG ADAPTIC 3X8 NADH LF (GAUZE/BANDAGES/DRESSINGS) ×1 IMPLANT
DRSG PREVENA PLUS CUSTOM (GAUZE/BANDAGES/DRESSINGS) ×2
DURAPREP 26ML APPLICATOR (WOUND CARE) ×2 IMPLANT
ELECT REM PT RETURN 9FT ADLT (ELECTROSURGICAL) ×2
ELECTRODE REM PT RTRN 9FT ADLT (ELECTROSURGICAL) IMPLANT
GAUZE SPONGE 4X4 12PLY STRL (GAUZE/BANDAGES/DRESSINGS) ×1 IMPLANT
GLOVE BIOGEL PI IND STRL 9 (GLOVE) ×1 IMPLANT
GLOVE BIOGEL PI INDICATOR 9 (GLOVE) ×1
GLOVE SURG ORTHO 9.0 STRL STRW (GLOVE) ×2 IMPLANT
GOWN STRL REUS W/ TWL XL LVL3 (GOWN DISPOSABLE) ×2 IMPLANT
GOWN STRL REUS W/TWL XL LVL3 (GOWN DISPOSABLE) ×4
KIT BASIN OR (CUSTOM PROCEDURE TRAY) ×2 IMPLANT
KIT TURNOVER KIT B (KITS) ×2 IMPLANT
MANIFOLD NEPTUNE II (INSTRUMENTS) ×2 IMPLANT
NS IRRIG 1000ML POUR BTL (IV SOLUTION) ×2 IMPLANT
PACK ORTHO EXTREMITY (CUSTOM PROCEDURE TRAY) ×2 IMPLANT
PAD ARMBOARD 7.5X6 YLW CONV (MISCELLANEOUS) ×4 IMPLANT
STOCKINETTE IMPERVIOUS 9X36 MD (GAUZE/BANDAGES/DRESSINGS) ×1 IMPLANT
SWAB COLLECTION DEVICE MRSA (MISCELLANEOUS) ×1 IMPLANT
SWAB CULTURE ESWAB REG 1ML (MISCELLANEOUS) IMPLANT
TOWEL GREEN STERILE (TOWEL DISPOSABLE) ×2 IMPLANT
TUBE CONNECTING 12X1/4 (SUCTIONS) ×2 IMPLANT
YANKAUER SUCT BULB TIP NO VENT (SUCTIONS) ×2 IMPLANT

## 2018-12-22 NOTE — Transfer of Care (Signed)
Immediate Anesthesia Transfer of Care Note  Patient: Hobart Sieg Kromer  Procedure(s) Performed: LEFT PARTIAL CALCANEAL EXCISION (Left Foot)  Patient Location: PACU  Anesthesia Type:General  Level of Consciousness: drowsy and patient cooperative  Airway & Oxygen Therapy: Patient Spontanous Breathing and Patient connected to face mask oxygen  Post-op Assessment: Report given to RN and Post -op Vital signs reviewed and stable  Post vital signs: Reviewed and stable  Last Vitals:  Vitals Value Taken Time  BP    Temp    Pulse 61 12/22/18 1105  Resp 29 12/22/18 1105  SpO2 96 % 12/22/18 1105  Vitals shown include unvalidated device data.  Last Pain:  Vitals:   12/22/18 0845  TempSrc: Oral         Complications: No apparent anesthesia complications

## 2018-12-22 NOTE — Anesthesia Postprocedure Evaluation (Signed)
Anesthesia Post Note  Patient: [name]  Procedure(s) Performed: LEFT PARTIAL CALCANEAL EXCISION (Left Foot)     Patient location during evaluation: PACU Anesthesia Type: General Level of consciousness: awake and alert Pain management: pain level controlled Vital Signs Assessment: post-procedure vital signs reviewed and stable Respiratory status: spontaneous breathing, nonlabored ventilation, respiratory function stable and patient connected to nasal cannula oxygen Cardiovascular status: blood pressure returned to baseline and stable Postop Assessment: no apparent nausea or vomiting Anesthetic complications: no    Last Vitals:  Vitals:   12/22/18 1135 12/22/18 1145  BP: (!) 143/87 (!) 154/89  Pulse: (!) 50   Resp: (!) 9 16  Temp: 36.6 C 36.6 C  SpO2: 99% 98%    Last Pain:  Vitals:   12/22/18 0845  TempSrc: Oral   Pain Goal:                   Lemar Bakos

## 2018-12-22 NOTE — H&P (Signed)
[name] is an 58 y.o. male.   Chief Complaint: Left Heel Ulcer HPI: Patient is a 58 year old gentleman seen in referral from Dr. Irving Shows.  Patient has uncontrolled type 2 diabetes with a ulcer over the left calcaneus.  Patient has undergone good conservative therapy with pressure unloading antibiotics and wound care and still has persistent ulceration.  Patient is currently on Cipro  Past Medical History:  Diagnosis Date  . Allergic rhinitis, cause unspecified   . Anginal pain ([place])   . Arthritis   . BPH (benign prostatic hyperplasia)   . Cervicalgia   . Chest pain Sept. 2005   multiple caths  /  cath 06/15/2010..normal coronaries,  EF 60%   (false postive nuclear in the past)  . CKD (chronic kidney disease), stage III   . Complication of anesthesia    Hard time waking up patient stated low blood pressures  . Dyslipidemia    mixed  . Esophageal reflux   . Fibromyalgia   . Gastroparesis   . Headache(784.0)   . History of kidney stones   . Hyperlipidemia   . Hypertension   . Hypoplasia of one kidney   . IDDM (insulin dependent diabetes mellitus)   . Lumbago   . Neuropathy associated with endocrine disorder ([place])   . Overweight(278.02)   . Peripheral vascular disease ([place])   . Pulmonary embolus ([facility])    Hx of small left lower lobe  pulmonary embolus  . Pulmonary embolus ([place]) 2010ish  . Stroke [facility])    mini stroke aug 2016  . Type II or unspecified type diabetes mellitus with neurological manifestations, not stated as uncontrolled(250.60)   . Urticaria, unspecified   . Wears glasses     Past Surgical History:  Procedure Laterality Date  . ACHILLES TENDON REPAIR Left 2013  . AMPUTATION Left 06/02/2013   Procedure: AMPUTATION 1ST TOE LEFT FOOT;  Surgeon: Marcheta Grammes, DPM;  Location: [facility];  Service: Orthopedics;  Laterality: Left;  . AMPUTATION Left 07/21/2013   Procedure: PARTIAL AMPUTATION 2ND TOE LEFT FOOT;  Surgeon: Marcheta Grammes, DPM;  Location:  [facility];  Service: Podiatry;  Laterality: Left;  . ANTERIOR CERVICAL DECOMP/DISCECTOMY FUSION N/A 12/29/2016   Procedure: Cervical five-six Cervical six-seven Anterior cervical discectomy with fusion and plate fixation;  Surgeon: Ditty, Kevan Ny, MD;  Location: [place];  Service: Neurosurgery;  Laterality: N/A;  . APPENDECTOMY    . BACK SURGERY  1999   x 6 some fusions  . BIOPSY  12/18/2017   Procedure: BIOPSY;  Surgeon: Rogene Houston, MD;  [facility];  Service: Endoscopy;;  gastric and esophagus  . colonscopy    . ESOPHAGOGASTRODUODENOSCOPY (EGD) WITH PROPOFOL N/A 12/18/2017   Procedure: ESOPHAGOGASTRODUODENOSCOPY (EGD) WITH PROPOFOL;  Surgeon: Rogene Houston, MD;  [facility];  Service: Endoscopy;  Laterality: N/A;  9:25  . FOOT ARTHRODESIS Right 01/11/2014   Procedure: ARTHRODESIS INTERPHALANGEAL JOINT HALLUX RIGHT FOOT;  Surgeon: Marcheta Grammes, DPM;  Location: [facility];  Service: Podiatry;  Laterality: Right;  . HARDWARE REMOVAL Right 01/17/2015   Procedure: HARDWARE REMOVAL;  Surgeon: Caprice Beaver, DPM;  Location: [facility];  Service: Podiatry;  Laterality: Right;  . KNEE ARTHROSCOPY Right 10/2015  . LUMBAR LAMINECTOMY/DECOMPRESSION MICRODISCECTOMY Right 01/12/2013   Procedure: Right Lumbar Three-Four Laminotomy/Foraminotomy;  Surgeon: Floyce Stakes, MD;  [facility];  Service: Neurosurgery;  Laterality: Right;  Right Lumbar Three-Four Laminotomy/Foraminotomy  . NISSEN FUNDOPLICATION    . SHOULDER  ARTHROSCOPY W/ ROTATOR CUFF REPAIR Right   . SHOULDER ARTHROSCOPY WITH DISTAL CLAVICLE RESECTION Right 10/04/2018   Procedure: RIGHT SHOULDER ARTHROSCOPY WITH DISTAL CLAVICLE RESECTION, EXTENSIVE DEBRIDEMENT, SUBACROMIAL PARTIAL ACROMIOPLASTY,;  Surgeon: Earlie Server, MD;  Location: [place];  Service: Orthopedics;  Laterality: Right;  PRE/POST OP SCALENE    Family History  Problem Relation Age of Onset  . Heart attack  Father 51  . Hyperlipidemia Father   . Hypertension Father   . Heart attack Mother 62  . Diabetes Mother   . Hypertension Mother   . Hyperlipidemia Mother   . Healthy Brother   . Seizures Brother   . Migraines Neg Hx    Social History:  reports that he quit smoking about 36 years ago. His smoking use included cigarettes. He started smoking about 41 years ago. He has a 15.00 pack-year smoking history. He has never used smokeless tobacco. He reports that he does not drink alcohol or use drugs.  Allergies:  Allergies  Allergen Reactions  . Yellow Jacket Venom [Bee Venom] Swelling  . Reglan [Metoclopramide] Itching and Rash    No medications prior to admission.    Results for orders placed or performed during the hospital encounter of 12/20/18 (from the past 48 hour(s))  SARS CORONAVIRUS 2 (TAT 6-24 HRS) Nasopharyngeal Nasopharyngeal Swab     Status: None   Collection Time: 12/20/18  9:52 AM   Specimen: Nasopharyngeal Swab  Result Value Ref Range   SARS Coronavirus 2 NEGATIVE NEGATIVE    Comment: (NOTE) SARS-CoV-2 target nucleic acids are NOT DETECTED. The SARS-CoV-2 RNA is generally detectable in upper and lower respiratory specimens during the acute phase of infection. Negative results do not preclude SARS-CoV-2 infection, do not rule out co-infections with other pathogens, and should not be used as the sole basis for treatment or other patient management decisions. Negative results must be combined with clinical observations, patient history, and epidemiological information. The expected result is Negative. Fact Sheet for Patients: SugarRoll.be Fact Sheet for Healthcare Providers: https://www.woods-mathews.com/ This test is not yet approved or cleared by the [place] FDA and  has been authorized for detection and/or diagnosis of SARS-CoV-2 by FDA under an Emergency Use Authorization (EUA). This EUA will remain  in effect  (meaning this test can be used) for the duration of the COVID-19 declaration under Section 56 4(b)(1) of the Act, 21 U.S.C. section 360bbb-3(b)(1), unless the authorization is terminated or revoked sooner. Performed at [facility], [place] [address], [place], [place] [address]    No results found.  Review of Systems  All other systems reviewed and are negative.   There were no vitals taken for this visit. Physical Exam  Patient is alert, oriented, no adenopathy, well-dressed, normal affect, normal respiratory effort.  Examination patient has a good dorsalis pedis pulse.  Patient has a 5 mm ulcer without cellulitis on the plantar aspect the left calcaneus.  This probes 2 cm down to the calcaneus.  There is no ascending cellulitis no purulent drainage no signs of abscess. Assessment/Plan  1. Acute osteomyelitis of left calcaneus [facility])     Plan: Patient will complete his course of antibiotics we will follow-up after the antibiotics are completed.  Discussed that with the ulcer extending to bone the best option is to proceed with partial calcaneal excision excision of the ulcer and wound closure.  Discussed that its highly unlikely to resolve the calcaneal infection with antibiotics.  We will reevaluate at follow-up discussed that  we could proceed with outpatient surgery.  Bevely Palmer Tramell Piechota, PA 12/22/2018, 7:27 AM

## 2018-12-22 NOTE — Anesthesia Procedure Notes (Signed)
Procedure Name: LMA Insertion Date/Time: 12/22/2018 10:35 AM Performed by: Kathryne Hitch, CRNA Pre-anesthesia Checklist: Patient identified, Emergency Drugs available, Suction available and Patient being monitored Patient Re-evaluated:Patient Re-evaluated prior to induction Oxygen Delivery Method: Circle system utilized Preoxygenation: Pre-oxygenation with 100% oxygen Induction Type: IV induction Ventilation: Mask ventilation without difficulty LMA: LMA inserted LMA Size: 5.0 Placement Confirmation: breath sounds checked- equal and bilateral Tube secured with: Tape Dental Injury: Teeth and Oropharynx as per pre-operative assessment

## 2018-12-22 NOTE — Op Note (Signed)
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 Application of Prevena incisional wound VAC.  SURGEON:  Newt Minion, MD  PHYSICIAN ASSISTANT:None ANESTHESIA:   General  PREOPERATIVE INDICATIONS:  [name] is a  58 y.o. male with a diagnosis of Osteomyelitis Left Calcaneous who failed conservative measures and elected for surgical management.    The risks benefits and alternatives were discussed with the patient preoperatively including but not limited to the risks of infection, bleeding, nerve injury, cardiopulmonary complications, the need for revision surgery, among others, and the patient was willing to proceed.  OPERATIVE IMPLANTS: Praveena incisional wound VAC customizable  @ENCIMAGES @  OPERATIVE FINDINGS: Large deep abscess extended down to bone.  OPERATIVE PROCEDURE: Patient was brought the operating room and underwent a general anesthetic.  After adequate levels anesthesia were obtained patient's left lower extremity was prepped using DuraPrep draped into a sterile field a timeout was called.  Elliptical incision was made around the ulcerative tissue this was carried sharply down to bone all the affected sharp tissue was sharply resected.  This left a wound that was 7 x 3 cm.  Osteotome was used to remove part of the calcaneum and this was further revised using a rondure.  The wound was irrigated with normal saline electrocautery was used for hemostasis local tissue rearrangement was used to close the wound with 2-0 nylon 7 x 3 cm.  A Praveena wound VAC was applied this had a good suction fit patient was extubated taken to PACU in stable condition.   DISCHARGE PLANNING:  Antibiotic duration: Preoperative  antibiotics  Weightbearing: Nonweightbearing on the left  Pain medication: Prescription for Percocet  Dressing care/ Wound VAC: Continue wound VAC for 1 week  Ambulatory devices: Walker  Discharge to: Home.  Follow-up: In the office 1 week post operative.

## 2018-12-22 NOTE — Progress Notes (Signed)
Orthopedic Tech Progress Note Patient Details:  Khadin Bresette [facility] [employer] [facility] [birth year] [id number] PACU RN called requesting post op shoe Ortho Devices Type of Ortho Device: Postop shoe/boot Ortho Device/Splint Location: LLE Ortho Device/Splint Interventions: Application, Ordered   Post Interventions Patient Tolerated: Well Instructions Provided: Care of device, Adjustment of device   Janit Pagan 12/22/2018, 11:58 AM

## 2018-12-23 ENCOUNTER — Encounter (HOSPITAL_COMMUNITY): Payer: Self-pay | Admitting: Orthopedic Surgery

## 2018-12-24 ENCOUNTER — Encounter (HOSPITAL_COMMUNITY): Payer: Self-pay | Admitting: Emergency Medicine

## 2018-12-24 ENCOUNTER — Other Ambulatory Visit: Payer: Self-pay

## 2018-12-24 ENCOUNTER — Emergency Department (HOSPITAL_COMMUNITY)
Admission: EM | Admit: 2018-12-24 | Discharge: 2018-12-24 | Disposition: A | Discharge status: Home / Self Care | Payer: Medicare HMO | Attending: Emergency Medicine | Admitting: Emergency Medicine

## 2018-12-24 DIAGNOSIS — Z86711 Personal history of pulmonary embolism: Secondary | ICD-10-CM | POA: Diagnosis not present

## 2018-12-24 DIAGNOSIS — Z79899 Other long term (current) drug therapy: Secondary | ICD-10-CM | POA: Insufficient documentation

## 2018-12-24 DIAGNOSIS — L089 Local infection of the skin and subcutaneous tissue, unspecified: Secondary | ICD-10-CM | POA: Diagnosis present

## 2018-12-24 DIAGNOSIS — T8140XA Infection following a procedure, unspecified, initial encounter: Secondary | ICD-10-CM | POA: Insufficient documentation

## 2018-12-24 DIAGNOSIS — E119 Type 2 diabetes mellitus without complications: Secondary | ICD-10-CM | POA: Diagnosis not present

## 2018-12-24 DIAGNOSIS — Y712 Prosthetic and other implants, materials and accessory cardiovascular devices associated with adverse incidents: Secondary | ICD-10-CM | POA: Insufficient documentation

## 2018-12-24 DIAGNOSIS — Z7982 Long term (current) use of aspirin: Secondary | ICD-10-CM | POA: Diagnosis not present

## 2018-12-24 DIAGNOSIS — T148XXA Other injury of unspecified body region, initial encounter: Secondary | ICD-10-CM

## 2018-12-24 DIAGNOSIS — Z87891 Personal history of nicotine dependence: Secondary | ICD-10-CM | POA: Insufficient documentation

## 2018-12-24 DIAGNOSIS — Z7984 Long term (current) use of oral hypoglycemic drugs: Secondary | ICD-10-CM | POA: Insufficient documentation

## 2018-12-24 DIAGNOSIS — T8189XA Other complications of procedures, not elsewhere classified, initial encounter: Secondary | ICD-10-CM | POA: Diagnosis not present

## 2018-12-24 NOTE — Consult Note (Signed)
[place] Nurse Consult Note: Reason for Consult:Presents to ED after Prevena incisional VAC is alarming blockage.  He had called manufacturer and done all of the troubleshooting he could do.  Upon arrival to ED, a clot is noted in tubing and is lodged free when turned off then back on. Seal is intact and there is no sound from the device.  Will keep dressing in place and he will follow up as scheduled with Dr Sharol Given on Tuesday. I wrapped the dressing in kerlix and Medipore tape.  Wound type:surgical with incisional VAC Pressure Injury POA: /NA Measurement:nonremovable dressing in place.   Drainage (amount, consistency, odor) Dark clotted material in canister.  Periwound:Drape in place Dressing procedure/placement/frequency: Followup as ordered with Dr Sharol Given.  Will not follow at this time.  Please re-consult if needed.  Domenic Moras MSN, RN, FNP-BC CWON Wound, Ostomy, Continence Nurse Pager [phone]

## 2018-12-24 NOTE — ED Provider Notes (Signed)
[facility] EMERGENCY DEPARTMENT Provider Note   CSN: [id number] Arrival date & time: 12/24/18  [id number]     History   Chief Complaint Chief Complaint  Patient presents with  . WoundVac Malfunction    HPI [name] is a 58 y.o. male.     Pt has recently had surgery by Dr. Sharol Given and has a wound vac.  Pt reports wound vac has been flashing and reading that there is a blockage.   The history is provided by the patient. No language interpreter was used.    Past Medical History:  Diagnosis Date  . Allergic rhinitis, cause unspecified   . Anginal pain ([place])   . Arthritis   . BPH (benign prostatic hyperplasia)   . Cervicalgia   . Chest pain Sept. 2005   multiple caths  /  cath 06/15/2010..normal coronaries,  EF 60%   (false postive nuclear in the past)  . CKD (chronic kidney disease), stage III   . Complication of anesthesia    Hard time waking up patient stated low blood pressures  . Dyslipidemia    mixed  . Esophageal reflux   . Fibromyalgia   . Gastroparesis   . Headache(784.0)   . History of kidney stones   . Hyperlipidemia   . Hypertension   . Hypoplasia of one kidney   . IDDM (insulin dependent diabetes mellitus)   . Lumbago   . Neuropathy associated with endocrine disorder ([place])   . Overweight(278.02)   . Peripheral vascular disease ([place])   . Pulmonary embolus (HCC)    Hx of small left lower lobe  pulmonary embolus  . Pulmonary embolus ([place]) 2010ish  . Stroke [facility])    mini stroke aug 2016  . Type II or unspecified type diabetes mellitus with neurological manifestations, not stated as uncontrolled(250.60)   . Urticaria, unspecified   . Wears glasses     Patient Active Problem List   Diagnosis Date Noted  . Acute osteomyelitis of left calcaneus (HCC)   . Ulcer of left heel and midfoot with necrosis of bone ([place])   . Gastroesophageal reflux disease without esophagitis 11/09/2017  . Abdominal pain, chronic, epigastric 11/09/2017  .  Numbness   . Syncope 03/13/2017  . H/O cervical spine surgery 03/13/2017  . Cervical spondylosis with radiculopathy 12/29/2016  . CVA (cerebral vascular accident) ([place]) 08/30/2014  . Diabetes with skin complication ([place]) 123XX123  . Neuropathy 08/30/2014  . AKI (acute kidney injury) ([place]) 08/30/2014  . Bilateral occipital neuralgia 10/30/2013  . Lumbar stenosis without neurogenic claudication 01/12/2013  . Type 2 diabetes mellitus with vascular disease ([place])   . Hypertension   . Hyperlipidemia   . Gastroparesis   . Pulmonary embolus ([place]) 2012   . Obesity   . GERD 01/14/2010  . Chest pain 10/05/2003    Past Surgical History:  Procedure Laterality Date  . ACHILLES TENDON REPAIR Left 2013  . AMPUTATION Left 06/02/2013   Procedure: AMPUTATION 1ST TOE LEFT FOOT;  Surgeon: Marcheta Grammes, DPM;  [facility];  Service: Orthopedics;  Laterality: Left;  . AMPUTATION Left 07/21/2013   Procedure: PARTIAL AMPUTATION 2ND TOE LEFT FOOT;  Surgeon: Marcheta Grammes, DPM;  Location: [facility];  Service: Podiatry;  Laterality: Left;  . ANTERIOR CERVICAL DECOMP/DISCECTOMY FUSION N/A 12/29/2016   Procedure: Cervical five-six Cervical six-seven Anterior cervical discectomy with fusion and plate fixation;  Surgeon: Ditty, Kevan Ny, MD;  Location: [facility];  Service: Neurosurgery;  Laterality: N/A;  .  APPENDECTOMY    . BACK SURGERY  1999   x 6 some fusions  . BIOPSY  12/18/2017   Procedure: BIOPSY;  Surgeon: Rogene Houston, MD;  [facility];  Service: Endoscopy;;  gastric and esophagus  . colonscopy    . ESOPHAGOGASTRODUODENOSCOPY (EGD) WITH PROPOFOL N/A 12/18/2017   Procedure: ESOPHAGOGASTRODUODENOSCOPY (EGD) WITH PROPOFOL;  Surgeon: Rogene Houston, MD;  [facility];  Service: Endoscopy;  Laterality: N/A;  9:25  . FOOT ARTHRODESIS Right 01/11/2014   Procedure: ARTHRODESIS INTERPHALANGEAL JOINT HALLUX RIGHT FOOT;  Surgeon: Marcheta Grammes, DPM;   Location: [facility];  Service: Podiatry;  Laterality: Right;  . HARDWARE REMOVAL Right 01/17/2015   Procedure: HARDWARE REMOVAL;  Surgeon: Caprice Beaver, DPM;  Location: [facility];  Service: Podiatry;  Laterality: Right;  . I&D EXTREMITY Left 12/22/2018   Procedure: LEFT PARTIAL CALCANEAL EXCISION;  Surgeon: Newt Minion, MD;  Location: [facility];  Service: Orthopedics;  Laterality: Left;  . KNEE ARTHROSCOPY Right 10/2015  . LUMBAR LAMINECTOMY/DECOMPRESSION MICRODISCECTOMY Right 01/12/2013   Procedure: Right Lumbar Three-Four Laminotomy/Foraminotomy;  Surgeon: Floyce Stakes, MD;  [facility];  Service: Neurosurgery;  Laterality: Right;  Right Lumbar Three-Four Laminotomy/Foraminotomy  . NISSEN FUNDOPLICATION    . SHOULDER ARTHROSCOPY W/ ROTATOR CUFF REPAIR Right   . SHOULDER ARTHROSCOPY WITH DISTAL CLAVICLE RESECTION Right 10/04/2018   Procedure: RIGHT SHOULDER ARTHROSCOPY WITH DISTAL CLAVICLE RESECTION, EXTENSIVE DEBRIDEMENT, SUBACROMIAL PARTIAL ACROMIOPLASTY,;  Surgeon: Earlie Server, MD;  Location: [facility];  Service: Orthopedics;  Laterality: Right;  PRE/POST OP SCALENE        Home Medications    Prior to Admission medications   Medication Sig Start Date End Date Taking? Authorizing Provider  aspirin EC 81 MG tablet Take 1 tablet (81 mg total) by mouth daily. 12/19/17   Rehman, Mechele Dawley, MD  atorvastatin (LIPITOR) 40 MG tablet Take 1 tablet (40 mg total) by mouth daily. 03/17/17   Kayleen Memos, DO  ciprofloxacin (CIPRO) 500 MG tablet Take 500 mg by mouth 2 (two) times daily.    [provider]  empagliflozin (JARDIANCE) 10 MG TABS tablet Take 10 mg by mouth every morning.    [provider]  EPINEPHrine 0.3 mg/0.3 mL IJ SOAJ injection Inject 0.3 mg into the muscle as needed for anaphylaxis.    [provider]  ferrous sulfate 325 (65 FE) MG tablet Take 325 mg by mouth daily.    [provider]  gabapentin (NEURONTIN) 600  MG tablet TAKE 4 TABLETS BY MOUTH AT BEDTIME. Patient taking differently: Take 1,600 mg by mouth 2 (two) times daily.  02/18/16   Cassandria Anger, MD  glipiZIDE (GLUCOTROL) 10 MG tablet Take 10 mg by mouth daily.    [provider]  LEVEMIR 100 UNIT/ML injection INJECT 60 UNITS INTO THE SKIN AT BEDTIME Patient taking differently: Inject 67 Units into the skin at bedtime.  03/17/16   Cassandria Anger, MD  metFORMIN (GLUCOPHAGE) 500 MG tablet Take 1 tablet (500 mg total) by mouth 2 (two) times daily with a meal. 2 pills 2 times a day Patient not taking: Reported on 12/20/2018 06/20/15   Cassandria Anger, MD  methocarbamol (ROBAXIN) 500 MG tablet Take 1 tablet (500 mg total) by mouth every 6 (six) hours as needed for muscle spasms. Patient not taking: Reported on 12/20/2018 10/04/18   Chadwell, Vonna Kotyk, PA-C  omega-3 acid ethyl esters (LOVAZA) 1 g capsule Take 1 g by  mouth daily.  03/10/18   [provider]  oxyCODONE-acetaminophen (PERCOCET) 5-325 MG tablet Take 1 tablet by mouth every 4 (four) hours as needed for severe pain. 12/22/18 12/22/19  Persons, Bevely Palmer, PA  ranolazine (RANEXA) 1000 MG SR tablet TAKE 1 TABLET BY MOUTH DAILY Patient taking differently: Take 1,000 mg by mouth daily.  09/10/18   Herminio Commons, MD  sodium bicarbonate 650 MG tablet Take 650 mg by mouth daily.     [provider]    Family History Family History  Problem Relation Age of Onset  . Heart attack Father 73  . Hyperlipidemia Father   . Hypertension Father   . Heart attack Mother 53  . Diabetes Mother   . Hypertension Mother   . Hyperlipidemia Mother   . Healthy Brother   . Seizures Brother   . Migraines Neg Hx     Social History Social History   Tobacco Use  . Smoking status: Former Smoker    Packs/day: 3.00    Years: 5.00    Pack years: 15.00    Types: Cigarettes    Start date: 06/20/1977    Quit date: 07/20/1982    Years since quitting: 36.4  .  Smokeless tobacco: Never Used  . Tobacco comment: quit 1980's  Substance Use Topics  . Alcohol use: No    Alcohol/week: 0.0 standard drinks  . Drug use: No     Allergies   Yellow jacket venom [bee venom] and Reglan [metoclopramide]   Review of Systems Review of Systems  All other systems reviewed and are negative.    Physical Exam Updated Vital Signs BP 131/85   Pulse (!) 55   Temp 98 F (36.7 C) (Oral)   Resp 16   SpO2 94%   Physical Exam Vitals signs and nursing note reviewed.  Constitutional:      Appearance: He is well-developed.  HENT:     Head: Normocephalic and atraumatic.  Eyes:     Conjunctiva/sclera: Conjunctivae normal.  Neck:     Musculoskeletal: Neck supple.  Cardiovascular:     Rate and Rhythm: Normal rate and regular rhythm.     Heart sounds: No murmur.  Pulmonary:     Effort: Pulmonary effort is normal. No respiratory distress.     Breath sounds: Normal breath sounds.  Abdominal:     Tenderness: There is no abdominal tenderness.  Skin:    General: Skin is warm and dry.     Comments: Bandaged wound with wound vac.    Neurological:     General: No focal deficit present.     Mental Status: He is alert.      ED Treatments / Results  Labs (all labs ordered are listed, but only abnormal results are displayed) Labs Reviewed - No data to display  EKG None  Radiology No results found.  Procedures Procedures (including critical care time)  Medications Ordered in ED Medications - No data to display   Initial Impression / Assessment and Plan / ED Course  I have reviewed the triage vital signs and the nursing notes.  Pertinent labs & imaging results that were available during my care of the patient were reviewed by me and considered in my medical decision making (see chart for details).        Wound vac seems to be working when I evaluated.  Consult to Wound care RN. I spoke to Melody at 7am who advised she is [place] Rn. Pages to  [facility] Wound  care, Repaged at 8:30 Rn reports Dr. Sharol Given manages his wound vac's.  She will speak to his off ice advised she will see or someone in his office will see.  Wound care nurse saw pt and advised vac is working and he can go home   Final Clinical Impressions(s) / ED Diagnoses   Final diagnoses:  Infection of wound without complication    ED Discharge Orders    None    An After Visit Summary was printed and given to the patient.    Fransico Meadow, [place] 12/24/18 [id number]    Gareth Morgan, MD 12/26/18 [phone]

## 2018-12-24 NOTE — Discharge Instructions (Signed)
Return if any problems.

## 2018-12-24 NOTE — ED Triage Notes (Signed)
Patient reports "beeping" woundvac alarm this morning , s/p foot surgery 2 days ago , dressing intact , he called the woundvac service line - advised to go to hospital for evaluation .

## 2018-12-25 ENCOUNTER — Emergency Department (HOSPITAL_COMMUNITY)
Admission: EM | Admit: 2018-12-25 | Discharge: 2018-12-25 | Disposition: A | Discharge status: Home / Self Care | Payer: Medicare HMO | Attending: Emergency Medicine | Admitting: Emergency Medicine

## 2018-12-25 ENCOUNTER — Encounter (HOSPITAL_COMMUNITY): Payer: Self-pay | Admitting: *Deleted

## 2018-12-25 ENCOUNTER — Other Ambulatory Visit: Payer: Self-pay

## 2018-12-25 DIAGNOSIS — Z4689 Encounter for fitting and adjustment of other specified devices: Secondary | ICD-10-CM

## 2018-12-25 DIAGNOSIS — Z7689 Persons encountering health services in other specified circumstances: Secondary | ICD-10-CM

## 2018-12-25 DIAGNOSIS — N183 Chronic kidney disease, stage 3 unspecified: Secondary | ICD-10-CM | POA: Diagnosis not present

## 2018-12-25 DIAGNOSIS — Z4889 Encounter for other specified surgical aftercare: Secondary | ICD-10-CM | POA: Diagnosis not present

## 2018-12-25 DIAGNOSIS — Z7982 Long term (current) use of aspirin: Secondary | ICD-10-CM | POA: Diagnosis not present

## 2018-12-25 DIAGNOSIS — E1122 Type 2 diabetes mellitus with diabetic chronic kidney disease: Secondary | ICD-10-CM | POA: Diagnosis not present

## 2018-12-25 DIAGNOSIS — Z87891 Personal history of nicotine dependence: Secondary | ICD-10-CM | POA: Diagnosis not present

## 2018-12-25 DIAGNOSIS — Z79899 Other long term (current) drug therapy: Secondary | ICD-10-CM | POA: Diagnosis not present

## 2018-12-25 DIAGNOSIS — Z794 Long term (current) use of insulin: Secondary | ICD-10-CM | POA: Diagnosis not present

## 2018-12-25 DIAGNOSIS — Z4801 Encounter for change or removal of surgical wound dressing: Secondary | ICD-10-CM | POA: Diagnosis present

## 2018-12-25 DIAGNOSIS — Z5189 Encounter for other specified aftercare: Secondary | ICD-10-CM | POA: Diagnosis not present

## 2018-12-25 DIAGNOSIS — I129 Hypertensive chronic kidney disease with stage 1 through stage 4 chronic kidney disease, or unspecified chronic kidney disease: Secondary | ICD-10-CM | POA: Diagnosis not present

## 2018-12-25 DIAGNOSIS — Z89422 Acquired absence of other left toe(s): Secondary | ICD-10-CM | POA: Insufficient documentation

## 2018-12-25 NOTE — ED Provider Notes (Signed)
[facility] EMERGENCY DEPARTMENT Provider Note   CSN: [id number] Arrival date & time: 12/25/18  0730     History   Chief Complaint Chief Complaint  Patient presents with  . Wound Check    HPI [name] is a 58 y.o. male presenting for recheck of wound VAC.  Patient states he had partial excision of his left calcaneus with Dr. Sharol Given 3 days ago.  A wound VAC was placed at that time.  Patient states yesterday he had to be seen in the ED after the wound VAC said that it was clogged.  This resolved without intervention, by the time the wound nurse came to evaluate the patient, call was resolved.  This morning at 5 AM the wound VAC once again stated it was clogged.  It has been clogged since then.  He denies pain at the site.  No bleeding or pus.  No fevers, chills, nausea, vomiting.  Sugars have been stable.     HPI  Past Medical History:  Diagnosis Date  . Allergic rhinitis, cause unspecified   . Anginal pain ([place])   . Arthritis   . BPH (benign prostatic hyperplasia)   . Cervicalgia   . Chest pain Sept. 2005   multiple caths  /  cath 06/15/2010..normal coronaries,  EF 60%   (false postive nuclear in the past)  . CKD (chronic kidney disease), stage III   . Complication of anesthesia    Hard time waking up patient stated low blood pressures  . Dyslipidemia    mixed  . Esophageal reflux   . Fibromyalgia   . Gastroparesis   . Headache(784.0)   . History of kidney stones   . Hyperlipidemia   . Hypertension   . Hypoplasia of one kidney   . IDDM (insulin dependent diabetes mellitus)   . Lumbago   . Neuropathy associated with endocrine disorder ([place])   . Overweight(278.02)   . Peripheral vascular disease ([place])   . Pulmonary embolus (HCC)    Hx of small left lower lobe  pulmonary embolus  . Pulmonary embolus ([place]) 2010ish  . Stroke [facility])    mini stroke aug 2016  . Type II or unspecified type diabetes mellitus with neurological manifestations, not  stated as uncontrolled(250.60)   . Urticaria, unspecified   . Wears glasses     Patient Active Problem List   Diagnosis Date Noted  . Acute osteomyelitis of left calcaneus (HCC)   . Ulcer of left heel and midfoot with necrosis of bone ([place])   . Gastroesophageal reflux disease without esophagitis 11/09/2017  . Abdominal pain, chronic, epigastric 11/09/2017  . Numbness   . Syncope 03/13/2017  . H/O cervical spine surgery 03/13/2017  . Cervical spondylosis with radiculopathy 12/29/2016  . CVA (cerebral vascular accident) ([place]) 08/30/2014  . Diabetes with skin complication ([place]) 123XX123  . Neuropathy 08/30/2014  . AKI (acute kidney injury) ([place]) 08/30/2014  . Bilateral occipital neuralgia 10/30/2013  . Lumbar stenosis without neurogenic claudication 01/12/2013  . Type 2 diabetes mellitus with vascular disease ([place])   . Hypertension   . Hyperlipidemia   . Gastroparesis   . Pulmonary embolus ([place]) 2012   . Obesity   . GERD 01/14/2010  . Chest pain 10/05/2003    Past Surgical History:  Procedure Laterality Date  . ACHILLES TENDON REPAIR Left 2013  . AMPUTATION Left 06/02/2013   Procedure: AMPUTATION 1ST TOE LEFT FOOT;  Surgeon: Marcheta Grammes, DPM;  [facility];  Service: Orthopedics;  Laterality: Left;  . AMPUTATION Left 07/21/2013   Procedure: PARTIAL AMPUTATION 2ND TOE LEFT FOOT;  Surgeon: Marcheta Grammes, DPM;  [facility];  Service: Podiatry;  Laterality: Left;  . ANTERIOR CERVICAL DECOMP/DISCECTOMY FUSION N/A 12/29/2016   Procedure: Cervical five-six Cervical six-seven Anterior cervical discectomy with fusion and plate fixation;  Surgeon: Ditty, Kevan Ny, MD;  Location: [facility];  Service: Neurosurgery;  Laterality: N/A;  . APPENDECTOMY    . BACK SURGERY  1999   x 6 some fusions  . BIOPSY  12/18/2017   Procedure: BIOPSY;  Surgeon: Rogene Houston, MD;  [facility];  Service: Endoscopy;;  gastric and esophagus  . colonscopy    .  ESOPHAGOGASTRODUODENOSCOPY (EGD) WITH PROPOFOL N/A 12/18/2017   Procedure: ESOPHAGOGASTRODUODENOSCOPY (EGD) WITH PROPOFOL;  Surgeon: Rogene Houston, MD;  [facility];  Service: Endoscopy;  Laterality: N/A;  9:25  . FOOT ARTHRODESIS Right 01/11/2014   Procedure: ARTHRODESIS INTERPHALANGEAL JOINT HALLUX RIGHT FOOT;  Surgeon: Marcheta Grammes, DPM;  Location[facility];  Service: Podiatry;  Laterality: Right;  . HARDWARE REMOVAL Right 01/17/2015   Procedure: HARDWARE REMOVAL;  Surgeon: Caprice Beaver, DPM;  Location[facility];  Service: Podiatry;  Laterality: Right;  . I&D EXTREMITY Left 12/22/2018   Procedure: LEFT PARTIAL CALCANEAL EXCISION;  Surgeon: Newt Minion, MD;  Location: [facility];  Service: Orthopedics;  Laterality: Left;  . KNEE ARTHROSCOPY Right 10/2015  . LUMBAR LAMINECTOMY/DECOMPRESSION MICRODISCECTOMY Right 01/12/2013   Procedure: Right Lumbar Three-Four Laminotomy/Foraminotomy;  Surgeon: Floyce Stakes, MD;  [facility];  Service: Neurosurgery;  Laterality: Right;  Right Lumbar Three-Four Laminotomy/Foraminotomy  . NISSEN FUNDOPLICATION    . SHOULDER ARTHROSCOPY W/ ROTATOR CUFF REPAIR Right   . SHOULDER ARTHROSCOPY WITH DISTAL CLAVICLE RESECTION Right 10/04/2018   Procedure: RIGHT SHOULDER ARTHROSCOPY WITH DISTAL CLAVICLE RESECTION, EXTENSIVE DEBRIDEMENT, SUBACROMIAL PARTIAL ACROMIOPLASTY,;  Surgeon: Earlie Server, MD;  Location: [facility];  Service: Orthopedics;  Laterality: Right;  PRE/POST OP SCALENE        Home Medications    Prior to Admission medications   Medication Sig Start Date End Date Taking? Authorizing Provider  aspirin EC 81 MG tablet Take 1 tablet (81 mg total) by mouth daily. 12/19/17   Rehman, Mechele Dawley, MD  atorvastatin (LIPITOR) 40 MG tablet Take 1 tablet (40 mg total) by mouth daily. 03/17/17   Kayleen Memos, DO  ciprofloxacin (CIPRO) 500 MG tablet Take 500 mg by mouth 2 (two) times daily.    [provider]  empagliflozin (JARDIANCE) 10 MG TABS tablet Take 10 mg by mouth every morning.    [provider]  EPINEPHrine 0.3 mg/0.3 mL IJ SOAJ injection Inject 0.3 mg into the muscle as needed for anaphylaxis.    [provider]  ferrous sulfate 325 (65 FE) MG tablet Take 325 mg by mouth daily.    [provider]  gabapentin (NEURONTIN) 600 MG tablet TAKE 4 TABLETS BY MOUTH AT BEDTIME. Patient taking differently: Take 1,600 mg by mouth 2 (two) times daily.  02/18/16   Cassandria Anger, MD  glipiZIDE (GLUCOTROL) 10 MG tablet Take 10 mg by mouth daily.    [provider]  LEVEMIR 100 UNIT/ML injection INJECT 60 UNITS INTO THE SKIN AT BEDTIME Patient taking differently: Inject 67 Units into the skin at bedtime.  03/17/16   Cassandria Anger, MD  metFORMIN (GLUCOPHAGE) 500 MG tablet Take 1 tablet (500 mg total) by mouth 2 (  two) times daily with a meal. 2 pills 2 times a day Patient not taking: Reported on 12/20/2018 06/20/15   Cassandria Anger, MD  methocarbamol (ROBAXIN) 500 MG tablet Take 1 tablet (500 mg total) by mouth every 6 (six) hours as needed for muscle spasms. Patient not taking: Reported on 12/20/2018 10/04/18   Chadwell, Vonna Kotyk, PA-C  omega-3 acid ethyl esters (LOVAZA) 1 g capsule Take 1 g by mouth daily.  03/10/18   [provider]  oxyCODONE-acetaminophen (PERCOCET) 5-325 MG tablet Take 1 tablet by mouth every 4 (four) hours as needed for severe pain. 12/22/18 12/22/19  Persons, Bevely Palmer, PA  ranolazine (RANEXA) 1000 MG SR tablet TAKE 1 TABLET BY MOUTH DAILY Patient taking differently: Take 1,000 mg by mouth daily.  09/10/18   Herminio Commons, MD  sodium bicarbonate 650 MG tablet Take 650 mg by mouth daily.     [provider]    Family History Family History  Problem Relation Age of Onset  . Heart attack Father 17  . Hyperlipidemia Father   . Hypertension Father   . Heart attack Mother 30  . Diabetes Mother    . Hypertension Mother   . Hyperlipidemia Mother   . Healthy Brother   . Seizures Brother   . Migraines Neg Hx     Social History Social History   Tobacco Use  . Smoking status: Former Smoker    Packs/day: 3.00    Years: 5.00    Pack years: 15.00    Types: Cigarettes    Start date: 06/20/1977    Quit date: 07/20/1982    Years since quitting: 36.4  . Smokeless tobacco: Never Used  . Tobacco comment: quit 1980's  Substance Use Topics  . Alcohol use: No    Alcohol/week: 0.0 standard drinks  . Drug use: No     Allergies   Yellow jacket venom [bee venom] and Reglan [metoclopramide]   Review of Systems Review of Systems  Constitutional: Negative for fever.  Skin: Positive for wound.     Physical Exam Updated Vital Signs BP 136/86   Pulse (!) 57   Temp 98.6 F (37 C) (Oral)   Resp 18   Ht 6\' 5"  (1.956 m)   Wt 120.2 kg   SpO2 99%   BMI 31.42 kg/m   Physical Exam Vitals signs and nursing note reviewed.  Constitutional:      General: He is not in acute distress.    Appearance: He is well-developed.     Comments: Resting comfortably in the bed in no acute distress  HENT:     Head: Normocephalic and atraumatic.  Neck:     Musculoskeletal: Normal range of motion.  Pulmonary:     Effort: Pulmonary effort is normal.  Abdominal:     General: There is no distension.  Musculoskeletal: Normal range of motion.  Skin:    General: Skin is warm.     Findings: No rash.     Comments: Wound VAC in place on the left heel.  No bleeding or pus.  No redness or tenderness.  Neurological:     Mental Status: He is alert and oriented to person, place, and time.      ED Treatments / Results  Labs (all labs ordered are listed, but only abnormal results are displayed) Labs Reviewed  CBG MONITORING, ED    EKG None  Radiology No results found.  Procedures Procedures (including critical care time)  Medications Ordered in ED Medications -  No data to display    Initial Impression / Assessment and Plan / ED Course  I have reviewed the triage vital signs and the nursing notes.  Pertinent labs & imaging results that were available during my care of the patient were reviewed by me and considered in my medical decision making (see chart for details).        Patient presenting for recheck of wound VAC.  Physical exam shows patient appears nontoxic.  No signs of infection.  Machine is beeping stating that there is a clot in the line.  Tubing does not appear kinked.  No wound care nurse is on-call at this time.  As such, will call orthopedics for management.  Discussed with Dr. Louanne Skye from orthopedics who recommends calling the [facility] for evaluation of wound vac.   Discussed with RN from [facility] who evaluated the wound vac changed the canister.  Extra canister given to pt to use as needed.  Patient to follow-up with Dr. Sharol Given at his scheduled appointment.  At this time, patient appears safe for discharge.  Return precautions given.  Patient states he understands and agrees to plan. Final Clinical Impressions(s) / ED Diagnoses   Final diagnoses:  Encounter for management of wound VAC  Visit for wound check    ED Discharge Orders    None       Franchot Heidelberg, PA-C 12/25/18 1152    Tegeler, Gwenyth Allegra, MD 12/25/18 [phone]

## 2018-12-25 NOTE — ED Notes (Signed)
Wound care nurse at bedside.

## 2018-12-25 NOTE — Discharge Instructions (Signed)
Follow-up with Dr. Sharol Given after scheduled appointment. Return to the emergency room if you develop high fevers, severe worsening pain, pus draining from the area. Return to the emergency room with any new, worsening, or concerning symptoms.

## 2018-12-25 NOTE — ED Triage Notes (Signed)
Patient presents to ed c/o wound vac being clogged. States he was here yest for same. States he had surgery on his left heel on Wed. For a diabetic ulcer. Denies pain

## 2018-12-25 NOTE — ED Notes (Signed)
Left foot wrapped with kerlix. To secure wound vac.

## 2018-12-28 ENCOUNTER — Encounter: Payer: Self-pay | Admitting: Family

## 2018-12-28 ENCOUNTER — Other Ambulatory Visit: Payer: Self-pay

## 2018-12-28 ENCOUNTER — Ambulatory Visit (INDEPENDENT_AMBULATORY_CARE_PROVIDER_SITE_OTHER): Payer: Medicare HMO | Admitting: Family

## 2018-12-28 VITALS — Ht 77.0 in | Wt 265.0 lb

## 2018-12-28 DIAGNOSIS — M86172 Other acute osteomyelitis, left ankle and foot: Secondary | ICD-10-CM

## 2018-12-28 DIAGNOSIS — E1142 Type 2 diabetes mellitus with diabetic polyneuropathy: Secondary | ICD-10-CM

## 2018-12-28 NOTE — Progress Notes (Signed)
Office Visit Note   Patient: [name]           Date of Birth: Dec 02, 1960           MRN: [id number] Visit Date: 12/28/2018              Requested by: Arsenio Katz, NP [place],  [place] [address] PCP: Arsenio Katz, NP  Chief Complaint  Patient presents with  . Left Foot - Routine Post Op    12/22/18 partial calcaneal excision       HPI: Patient is a 58 year old gentleman status post partial calcaneal excision for ulceration osteomyelitis.  Patient states he had 2 episodes of the wound VAC clogging he went to the emergency room and this was unclogged.  Patient states he has completed his course of Cipro.  Assessment & Plan: Visit Diagnoses:  1. Acute osteomyelitis of left calcaneus (HCC)   2. Diabetic polyneuropathy associated with type 2 diabetes mellitus ([place])     Plan: Patient will start Dial soap cleansing dry dressing change daily continue strict nonweightbearing.  We will get him into a PRAFO we do not have them in the office today.  Possible [place] sutures at follow-up.  Follow-Up Instructions: Return in about 2 weeks (around 01/11/2019).   Ortho Exam  Patient is alert, oriented, no adenopathy, well-dressed, normal affect, normal respiratory effort. Examination there is maceration around the wound edges the wound edges are well approximated there is a small amount of bloody drainage no purulence no signs of infection.  Imaging: No results found. No images are attached to the encounter.  Labs: Lab Results  Component Value Date   HGBA1C 8.4 (H) 03/15/2017   HGBA1C 8.9 (H) 12/24/2016   HGBA1C 7.8 (H) 09/27/2015   REPTSTATUS 06/07/2013 FINAL 06/02/2013   REPTSTATUS 06/04/2013 FINAL 06/02/2013   GRAMSTAIN  06/02/2013    RARE WBC PRESENT,BOTH PMN AND MONONUCLEAR NO SQUAMOUS EPITHELIAL CELLS SEEN NO ORGANISMS SEEN Performed at [facility]  06/02/2013    RARE WBC PRESENT,BOTH PMN AND MONONUCLEAR NO SQUAMOUS EPITHELIAL CELLS  SEEN NO ORGANISMS SEEN Performed at [facility]   CULT  06/02/2013    NO ANAEROBES ISOLATED Performed at [facility]  06/02/2013    NO GROWTH 2 DAYS Performed at [facility]     Lab Results  Component Value Date   ALBUMIN 4.0 04/06/2018   ALBUMIN 4.3 10/30/2017   ALBUMIN 4.3 03/13/2017    No results found for: MG No results found for: VD25OH  No results found for: PREALBUMIN CBC EXTENDED Latest Ref Rng & Units 12/22/2018 10/04/2018 04/06/2018  WBC 4.0 - 10.5 K/uL 6.7 - 11.9(H)  RBC 4.22 - 5.81 MIL/uL 4.80 - 5.32  HGB 13.0 - 17.0 g/dL 12.7(L) 13.6 14.9  HCT 39.0 - 52.0 % 42.3 40.0 45.0  PLT 150 - 400 K/uL 236 - 162  NEUTROABS 1.7 - 7.7 K/uL - - 11.1(H)  LYMPHSABS 0.7 - 4.0 K/uL - - 0.3(L)     Body mass index is 31.42 kg/m.  Orders:  No orders of the defined types were placed in this encounter.  No orders of the defined types were placed in this encounter.    Procedures: No procedures performed  Clinical Data: No additional findings.  ROS:  All other systems negative, except as noted in the HPI. Review of Systems  Objective: Vital Signs: Ht 6\' 5"  (1.956 m)   Wt 265 lb (120.2 kg)  BMI 31.42 kg/m   Specialty Comments:  No specialty comments available.  PMFS History: Patient Active Problem List   Diagnosis Date Noted  . Acute osteomyelitis of left calcaneus (HCC)   . Ulcer of left heel and midfoot with necrosis of bone ([place])   . Gastroesophageal reflux disease without esophagitis 11/09/2017  . Abdominal pain, chronic, epigastric 11/09/2017  . Numbness   . Syncope 03/13/2017  . H/O cervical spine surgery 03/13/2017  . Cervical spondylosis with radiculopathy 12/29/2016  . CVA (cerebral vascular accident) ([place]) 08/30/2014  . Diabetes with skin complication ([place]) 123XX123  . Neuropathy 08/30/2014  . AKI (acute kidney injury) ([place]) 08/30/2014  . Bilateral occipital neuralgia 10/30/2013  . Lumbar stenosis without  neurogenic claudication 01/12/2013  . Type 2 diabetes mellitus with vascular disease ([place])   . Hypertension   . Hyperlipidemia   . Gastroparesis   . Pulmonary embolus ([place]) 2012   . Obesity   . GERD 01/14/2010  . Chest pain 10/05/2003   Past Medical History:  Diagnosis Date  . Allergic rhinitis, cause unspecified   . Anginal pain ([place])   . Arthritis   . BPH (benign prostatic hyperplasia)   . Cervicalgia   . Chest pain Sept. 2005   multiple caths  /  cath 06/15/2010..normal coronaries,  EF 60%   (false postive nuclear in the past)  . CKD (chronic kidney disease), stage III   . Complication of anesthesia    Hard time waking up patient stated low blood pressures  . Dyslipidemia    mixed  . Esophageal reflux   . Fibromyalgia   . Gastroparesis   . Headache(784.0)   . History of kidney stones   . Hyperlipidemia   . Hypertension   . Hypoplasia of one kidney   . IDDM (insulin dependent diabetes mellitus)   . Lumbago   . Neuropathy associated with endocrine disorder ([place])   . Overweight(278.02)   . Peripheral vascular disease ([place])   . Pulmonary embolus (HCC)    Hx of small left lower lobe  pulmonary embolus  . Pulmonary embolus ([place]) 2010ish  . Stroke [facility])    mini stroke aug 2016  . Type II or unspecified type diabetes mellitus with neurological manifestations, not stated as uncontrolled(250.60)   . Urticaria, unspecified   . Wears glasses     Family History  Problem Relation Age of Onset  . Heart attack Father 66  . Hyperlipidemia Father   . Hypertension Father   . Heart attack Mother 75  . Diabetes Mother   . Hypertension Mother   . Hyperlipidemia Mother   . Healthy Brother   . Seizures Brother   . Migraines Neg Hx     Past Surgical History:  Procedure Laterality Date  . ACHILLES TENDON REPAIR Left 2013  . AMPUTATION Left 06/02/2013   Procedure: AMPUTATION 1ST TOE LEFT FOOT;  Surgeon: Marcheta Grammes, DPM;  [facility];  Service: Orthopedics;   Laterality: Left;  . AMPUTATION Left 07/21/2013   Procedure: PARTIAL AMPUTATION 2ND TOE LEFT FOOT;  Surgeon: Marcheta Grammes, DPM;  [facility];  Service: Podiatry;  Laterality: Left;  . ANTERIOR CERVICAL DECOMP/DISCECTOMY FUSION N/A 12/29/2016   Procedure: Cervical five-six Cervical six-seven Anterior cervical discectomy with fusion and plate fixation;  Surgeon: Ditty, Kevan Ny, MD;  Location: [facility];  Service: Neurosurgery;  Laterality: N/A;  . APPENDECTOMY    . BACK SURGERY  1999   x 6 some fusions  . BIOPSY  12/18/2017  Procedure: BIOPSY;  Surgeon: Rogene Houston, MD;  [facility];  Service: Endoscopy;;  gastric and esophagus  . colonscopy    . ESOPHAGOGASTRODUODENOSCOPY (EGD) WITH PROPOFOL N/A 12/18/2017   Procedure: ESOPHAGOGASTRODUODENOSCOPY (EGD) WITH PROPOFOL;  Surgeon: Rogene Houston, MD;  [facility];  Service: Endoscopy;  Laterality: N/A;  9:25  . FOOT ARTHRODESIS Right 01/11/2014   Procedure: ARTHRODESIS INTERPHALANGEAL JOINT HALLUX RIGHT FOOT;  Surgeon: Marcheta Grammes, DPM;  [facility];  Service: Podiatry;  Laterality: Right;  . HARDWARE REMOVAL Right 01/17/2015   Procedure: HARDWARE REMOVAL;  Surgeon: Caprice Beaver, DPM;  [facility];  Service: Podiatry;  Laterality: Right;  . I&D EXTREMITY Left 12/22/2018   Procedure: LEFT PARTIAL CALCANEAL EXCISION;  Surgeon: Newt Minion, MD;  Location: [facility];  Service: Orthopedics;  Laterality: Left;  . KNEE ARTHROSCOPY Right 10/2015  . LUMBAR LAMINECTOMY/DECOMPRESSION MICRODISCECTOMY Right 01/12/2013   Procedure: Right Lumbar Three-Four Laminotomy/Foraminotomy;  Surgeon: Floyce Stakes, MD;  [facility];  Service: Neurosurgery;  Laterality: Right;  Right Lumbar Three-Four Laminotomy/Foraminotomy  . NISSEN FUNDOPLICATION    . SHOULDER ARTHROSCOPY W/ ROTATOR CUFF REPAIR Right   . SHOULDER ARTHROSCOPY WITH DISTAL CLAVICLE RESECTION Right 10/04/2018   Procedure:  RIGHT SHOULDER ARTHROSCOPY WITH DISTAL CLAVICLE RESECTION, EXTENSIVE DEBRIDEMENT, SUBACROMIAL PARTIAL ACROMIOPLASTY,;  Surgeon: Earlie Server, MD;  Location: [facility];  Service: Orthopedics;  Laterality: Right;  PRE/POST OP SCALENE   Social History   Occupational History  . Occupation: Retired- Research officer, trade union  Tobacco Use  . Smoking status: Former Smoker    Packs/day: 3.00    Years: 5.00    Pack years: 15.00    Types: Cigarettes    Start date: 06/20/1977    Quit date: 07/20/1982    Years since quitting: 36.4  . Smokeless tobacco: Never Used  . Tobacco comment: quit 1980's  Substance and Sexual Activity  . Alcohol use: No    Alcohol/week: 0.0 standard drinks  . Drug use: No  . Sexual activity: Yes    Birth control/protection: None

## 2019-01-04 ENCOUNTER — Telehealth: Payer: Self-pay

## 2019-01-04 HISTORY — PX: BELOW KNEE LEG AMPUTATION: SUR23

## 2019-01-04 NOTE — Telephone Encounter (Signed)
Patient called stating that his left foot has been bleeding for about 3-4 days and wanted to know if this is normal?  Stated that he has to change the bandage daily due to the blood coming through the bandage.  Offered patient an appointment, but stated that he could not come until Wednesday, 01/05/2019 after 2pm.  Appt. Scheduled.

## 2019-01-04 NOTE — Telephone Encounter (Signed)
I called and sw the pt to ask if he can come in today and he states that he is not able but does have an appt tomorrow. Advised to remain nonweightbearing, float the heel off pillows and elevate higher than his heart. To keep the area clean and dry and we will see him tomorrow to call with any questions.

## 2019-01-05 ENCOUNTER — Ambulatory Visit (INDEPENDENT_AMBULATORY_CARE_PROVIDER_SITE_OTHER): Payer: Medicare HMO | Admitting: Family

## 2019-01-05 ENCOUNTER — Other Ambulatory Visit: Payer: Self-pay

## 2019-01-05 ENCOUNTER — Encounter: Payer: Self-pay | Admitting: Family

## 2019-01-05 VITALS — Ht 77.0 in | Wt 265.0 lb

## 2019-01-05 DIAGNOSIS — M86172 Other acute osteomyelitis, left ankle and foot: Secondary | ICD-10-CM

## 2019-01-05 DIAGNOSIS — E1142 Type 2 diabetes mellitus with diabetic polyneuropathy: Secondary | ICD-10-CM

## 2019-01-05 MED ORDER — DOXYCYCLINE HYCLATE 100 MG PO TABS: 100.0000 mg | ORAL_TABLET | Freq: Two times a day (BID) | ORAL | 0 refills | Status: DC

## 2019-01-05 NOTE — Progress Notes (Signed)
Post-Op Visit Note   Patient: [name]           Date of Birth: 03/06/1960           MRN: [id number] Visit Date: 01/05/2019 PCP: Arsenio Katz, NP  Chief Complaint:  Chief Complaint  Patient presents with  . Left Foot - Routine Post Op    12/22/2018 left foot partial calcaneal excision    HPI:  HPI The patient is a 58 year old gentleman seen today status post left partial calcaneal excision on November 18.  States he has been nonweightbearing although he transfers to the chair heel walking on his operative foot.  He complains of some bloody drainage coming from his incision.  Reports odor.  He states his neighbor has been having to help him do his dressing changes.  Ortho Exam On examination the incision is well approximated sutures however he does have some serosanguineous drainage, moderate amount upon exam there is surrounding maceration there is a foul odor.  There is no surrounding cellulitis.  Visit Diagnoses:  1. Acute osteomyelitis of left calcaneus (HCC)   2. Diabetic polyneuropathy associated with type 2 diabetes mellitus ([place])     Plan: We will place the patient on doxycycline discussed the importance of nonweightbearing and elevation.  Daily dry dressing changes following wound cleansing.  Change the dressing at as needed soiled.  Follow-Up Instructions: Return in about 1 week (around 01/12/2019).   Imaging: No results found.  Orders:  No orders of the defined types were placed in this encounter.  No orders of the defined types were placed in this encounter.    PMFS History: Patient Active Problem List   Diagnosis Date Noted  . Acute osteomyelitis of left calcaneus (HCC)   . Ulcer of left heel and midfoot with necrosis of bone ([place])   . Gastroesophageal reflux disease without esophagitis 11/09/2017  . Abdominal pain, chronic, epigastric 11/09/2017  . Numbness   . Syncope 03/13/2017  . H/O cervical spine surgery 03/13/2017  . Cervical spondylosis  with radiculopathy 12/29/2016  . CVA (cerebral vascular accident) ([place]) 08/30/2014  . Diabetes with skin complication ([place]) 123XX123  . Neuropathy 08/30/2014  . AKI (acute kidney injury) ([place]) 08/30/2014  . Bilateral occipital neuralgia 10/30/2013  . Lumbar stenosis without neurogenic claudication 01/12/2013  . Type 2 diabetes mellitus with vascular disease ([place])   . Hypertension   . Hyperlipidemia   . Gastroparesis   . Pulmonary embolus ([place]) 2012   . Obesity   . GERD 01/14/2010  . Chest pain 10/05/2003   Past Medical History:  Diagnosis Date  . Allergic rhinitis, cause unspecified   . Anginal pain ([place])   . Arthritis   . BPH (benign prostatic hyperplasia)   . Cervicalgia   . Chest pain Sept. 2005   multiple caths  /  cath 06/15/2010..normal coronaries,  EF 60%   (false postive nuclear in the past)  . CKD (chronic kidney disease), stage III   . Complication of anesthesia    Hard time waking up patient stated low blood pressures  . Dyslipidemia    mixed  . Esophageal reflux   . Fibromyalgia   . Gastroparesis   . Headache(784.0)   . History of kidney stones   . Hyperlipidemia   . Hypertension   . Hypoplasia of one kidney   . IDDM (insulin dependent diabetes mellitus)   . Lumbago   . Neuropathy associated with endocrine disorder ([place])   . Overweight(278.02)   . Peripheral  vascular disease ([place])   . Pulmonary embolus (HCC)    Hx of small left lower lobe  pulmonary embolus  . Pulmonary embolus ([place]) 2010ish  . Stroke [facility])    mini stroke aug 2016  . Type II or unspecified type diabetes mellitus with neurological manifestations, not stated as uncontrolled(250.60)   . Urticaria, unspecified   . Wears glasses     Family History  Problem Relation Age of Onset  . Heart attack Father 73  . Hyperlipidemia Father   . Hypertension Father   . Heart attack Mother 31  . Diabetes Mother   . Hypertension Mother   . Hyperlipidemia Mother   . Healthy Brother   . Seizures  Brother   . Migraines Neg Hx     Past Surgical History:  Procedure Laterality Date  . ACHILLES TENDON REPAIR Left 2013  . AMPUTATION Left 06/02/2013   Procedure: AMPUTATION 1ST TOE LEFT FOOT;  Surgeon: Marcheta Grammes, DPM;  [facility];  Service: Orthopedics;  Laterality: Left;  . AMPUTATION Left 07/21/2013   Procedure: PARTIAL AMPUTATION 2ND TOE LEFT FOOT;  Surgeon: Marcheta Grammes, DPM;  [facility];  Service: Podiatry;  Laterality: Left;  . ANTERIOR CERVICAL DECOMP/DISCECTOMY FUSION N/A 12/29/2016   Procedure: Cervical five-six Cervical six-seven Anterior cervical discectomy with fusion and plate fixation;  Surgeon: Ditty, Kevan Ny, MD;  Location: [facility];  Service: Neurosurgery;  Laterality: N/A;  . APPENDECTOMY    . BACK SURGERY  1999   x 6 some fusions  . BIOPSY  12/18/2017   Procedure: BIOPSY;  Surgeon: Rogene Houston, MD;  [facility];  Service: Endoscopy;;  gastric and esophagus  . colonscopy    . ESOPHAGOGASTRODUODENOSCOPY (EGD) WITH PROPOFOL N/A 12/18/2017   Procedure: ESOPHAGOGASTRODUODENOSCOPY (EGD) WITH PROPOFOL;  Surgeon: Rogene Houston, MD;  [facility];  Service: Endoscopy;  Laterality: N/A;  9:25  . FOOT ARTHRODESIS Right 01/11/2014   Procedure: ARTHRODESIS INTERPHALANGEAL JOINT HALLUX RIGHT FOOT;  Surgeon: Marcheta Grammes, DPM;  [facility];  Service: Podiatry;  Laterality: Right;  . HARDWARE REMOVAL Right 01/17/2015   Procedure: HARDWARE REMOVAL;  Surgeon: Caprice Beaver, DPM;  [facility];  Service: Podiatry;  Laterality: Right;  . I&D EXTREMITY Left 12/22/2018   Procedure: LEFT PARTIAL CALCANEAL EXCISION;  Surgeon: Newt Minion, MD;  Location: [facility];  Service: Orthopedics;  Laterality: Left;  . KNEE ARTHROSCOPY Right 10/2015  . LUMBAR LAMINECTOMY/DECOMPRESSION MICRODISCECTOMY Right 01/12/2013   Procedure: Right Lumbar Three-Four Laminotomy/Foraminotomy;  Surgeon: Floyce Stakes, MD;   [facility];  Service: Neurosurgery;  Laterality: Right;  Right Lumbar Three-Four Laminotomy/Foraminotomy  . NISSEN FUNDOPLICATION    . SHOULDER ARTHROSCOPY W/ ROTATOR CUFF REPAIR Right   . SHOULDER ARTHROSCOPY WITH DISTAL CLAVICLE RESECTION Right 10/04/2018   Procedure: RIGHT SHOULDER ARTHROSCOPY WITH DISTAL CLAVICLE RESECTION, EXTENSIVE DEBRIDEMENT, SUBACROMIAL PARTIAL ACROMIOPLASTY,;  Surgeon: Earlie Server, MD;  Location: [facility];  Service: Orthopedics;  Laterality: Right;  PRE/POST OP SCALENE   Social History   Occupational History  . Occupation: Retired- Research officer, trade union  Tobacco Use  . Smoking status: Former Smoker    Packs/day: 3.00    Years: 5.00    Pack years: 15.00    Types: Cigarettes    Start date: 06/20/1977    Quit date: 07/20/1982    Years since quitting: 36.4  . Smokeless tobacco: Never Used  . Tobacco comment: quit 1980's  Substance and Sexual Activity  .  Alcohol use: No    Alcohol/week: 0.0 standard drinks  . Drug use: No  . Sexual activity: Yes    Birth control/protection: None

## 2019-01-10 ENCOUNTER — Other Ambulatory Visit (HOSPITAL_COMMUNITY)
Admission: RE | Admit: 2019-01-10 | Discharge: 2019-01-10 | Disposition: A | Discharge status: Home / Self Care | Payer: Medicare HMO | Source: Ambulatory Visit | Attending: Orthopedic Surgery | Admitting: Orthopedic Surgery

## 2019-01-10 ENCOUNTER — Encounter: Payer: Self-pay | Admitting: Orthopedic Surgery

## 2019-01-10 ENCOUNTER — Ambulatory Visit (INDEPENDENT_AMBULATORY_CARE_PROVIDER_SITE_OTHER): Payer: Medicare HMO | Admitting: Orthopedic Surgery

## 2019-01-10 ENCOUNTER — Other Ambulatory Visit: Payer: Self-pay | Admitting: Physician Assistant

## 2019-01-10 ENCOUNTER — Other Ambulatory Visit: Payer: Self-pay | Admitting: Cardiovascular Disease

## 2019-01-10 ENCOUNTER — Other Ambulatory Visit: Payer: Self-pay

## 2019-01-10 VITALS — Ht 77.0 in | Wt 265.0 lb

## 2019-01-10 DIAGNOSIS — Z01812 Encounter for preprocedural laboratory examination: Secondary | ICD-10-CM | POA: Insufficient documentation

## 2019-01-10 DIAGNOSIS — Z20828 Contact with and (suspected) exposure to other viral communicable diseases: Secondary | ICD-10-CM | POA: Insufficient documentation

## 2019-01-10 DIAGNOSIS — M86172 Other acute osteomyelitis, left ankle and foot: Secondary | ICD-10-CM

## 2019-01-10 LAB — SARS CORONAVIRUS 2 (TAT 6-24 HRS): SARS Coronavirus 2: NEGATIVE

## 2019-01-10 NOTE — Progress Notes (Signed)
Office Visit Note   Patient: [name]           Date of Birth: 09-22-1960           MRN: [id number] Visit Date: 01/10/2019              Requested by: Arsenio Katz, NP [place],  [place] [id number] PCP: Arsenio Katz, NP  Chief Complaint  Patient presents with  . Left Foot - Routine Post Op    12/22/18 left foot partial calcaneal excision       HPI: This is a pleasant gentleman who is now 4 weeks status post left foot partial calcaneal excision.  In his last few visits he has continued to have a nonhealing wound with skin maceration significant drainage and a foul odor.  He has tried a trial of oral antibiotics.  Assessment & Plan: Visit Diagnoses: No diagnosis found.  Plan: The patient was seen today with Dr. Sharol Given.  He discussed with the patient that the best option at this point would be a left below-knee amputation.  He reviewed the procedure as well as surgical outcomes and recovery.  With regards to follow-up the patient has worked with [employer] in the past for a back brace  Follow-Up Instructions: No follow-ups on file.   Ortho Exam  Patient is alert, oriented, no adenopathy, well-dressed, normal affect, normal respiratory effort. Left foot nonhealing surgical incision with sutures in place moderate amount of foul-smelling serous drainage he does have a palpable dorsalis pedis pulse but difficulty palpating posterior tibial tendon pulse the wound probes deep to bone  Imaging: No results found. No images are attached to the encounter.  Labs: Lab Results  Component Value Date   HGBA1C 8.4 (H) 03/15/2017   HGBA1C 8.9 (H) 12/24/2016   HGBA1C 7.8 (H) 09/27/2015   REPTSTATUS 06/07/2013 FINAL 06/02/2013   REPTSTATUS 06/04/2013 FINAL 06/02/2013   GRAMSTAIN  06/02/2013    RARE WBC PRESENT,BOTH PMN AND MONONUCLEAR NO SQUAMOUS EPITHELIAL CELLS SEEN NO ORGANISMS SEEN Performed at [facility]  06/02/2013    RARE WBC PRESENT,BOTH PMN AND  MONONUCLEAR NO SQUAMOUS EPITHELIAL CELLS SEEN NO ORGANISMS SEEN Performed at [facility]   CULT  06/02/2013    NO ANAEROBES ISOLATED Performed at [facility]  06/02/2013    NO GROWTH 2 DAYS Performed at [facility]     Lab Results  Component Value Date   ALBUMIN 4.0 04/06/2018   ALBUMIN 4.3 10/30/2017   ALBUMIN 4.3 03/13/2017    No results found for: MG No results found for: VD25OH  No results found for: PREALBUMIN CBC EXTENDED Latest Ref Rng & Units 12/22/2018 10/04/2018 04/06/2018  WBC 4.0 - 10.5 K/uL 6.7 - 11.9(H)  RBC 4.22 - 5.81 MIL/uL 4.80 - 5.32  HGB 13.0 - 17.0 g/dL 12.7(L) 13.6 14.9  HCT 39.0 - 52.0 % 42.3 40.0 45.0  PLT 150 - 400 K/uL 236 - 162  NEUTROABS 1.7 - 7.7 K/uL - - 11.1(H)  LYMPHSABS 0.7 - 4.0 K/uL - - 0.3(L)     Body mass index is 31.42 kg/m.  Orders:  No orders of the defined types were placed in this encounter.  No orders of the defined types were placed in this encounter.    Procedures: No procedures performed  Clinical Data: No additional findings.  ROS:  All other systems negative, except as noted in the HPI. Review of Systems  Objective: Vital Signs:  Ht 6\' 5"  (1.956 m)   Wt 265 lb (120.2 kg)   BMI 31.42 kg/m   Specialty Comments:  No specialty comments available.  PMFS History: Patient Active Problem List   Diagnosis Date Noted  . Acute osteomyelitis of left calcaneus (HCC)   . Ulcer of left heel and midfoot with necrosis of bone ([place])   . Gastroesophageal reflux disease without esophagitis 11/09/2017  . Abdominal pain, chronic, epigastric 11/09/2017  . Numbness   . Syncope 03/13/2017  . H/O cervical spine surgery 03/13/2017  . Cervical spondylosis with radiculopathy 12/29/2016  . CVA (cerebral vascular accident) ([place]) 08/30/2014  . Diabetes with skin complication ([place]) 123XX123  . Neuropathy 08/30/2014  . AKI (acute kidney injury) ([place]) 08/30/2014  . Bilateral occipital neuralgia  10/30/2013  . Lumbar stenosis without neurogenic claudication 01/12/2013  . Type 2 diabetes mellitus with vascular disease ([place])   . Hypertension   . Hyperlipidemia   . Gastroparesis   . Pulmonary embolus ([place]) 2012   . Obesity   . GERD 01/14/2010  . Chest pain 10/05/2003   Past Medical History:  Diagnosis Date  . Allergic rhinitis, cause unspecified   . Anginal pain ([place])   . Arthritis   . BPH (benign prostatic hyperplasia)   . Cervicalgia   . Chest pain Sept. 2005   multiple caths  /  cath 06/15/2010..normal coronaries,  EF 60%   (false postive nuclear in the past)  . CKD (chronic kidney disease), stage III   . Complication of anesthesia    Hard time waking up patient stated low blood pressures  . Dyslipidemia    mixed  . Esophageal reflux   . Fibromyalgia   . Gastroparesis   . Headache(784.0)   . History of kidney stones   . Hyperlipidemia   . Hypertension   . Hypoplasia of one kidney   . IDDM (insulin dependent diabetes mellitus)   . Lumbago   . Neuropathy associated with endocrine disorder ([place])   . Overweight(278.02)   . Peripheral vascular disease ([place])   . Pulmonary embolus (HCC)    Hx of small left lower lobe  pulmonary embolus  . Pulmonary embolus ([place]) 2010ish  . Stroke [facility])    mini stroke aug 2016  . Type II or unspecified type diabetes mellitus with neurological manifestations, not stated as uncontrolled(250.60)   . Urticaria, unspecified   . Wears glasses     Family History  Problem Relation Age of Onset  . Heart attack Father 42  . Hyperlipidemia Father   . Hypertension Father   . Heart attack Mother 44  . Diabetes Mother   . Hypertension Mother   . Hyperlipidemia Mother   . Healthy Brother   . Seizures Brother   . Migraines Neg Hx     Past Surgical History:  Procedure Laterality Date  . ACHILLES TENDON REPAIR Left 2013  . AMPUTATION Left 06/02/2013   Procedure: AMPUTATION 1ST TOE LEFT FOOT;  Surgeon: Marcheta Grammes, DPM;   Location: [facility];  Service: Orthopedics;  Laterality: Left;  . AMPUTATION Left 07/21/2013   Procedure: PARTIAL AMPUTATION 2ND TOE LEFT FOOT;  Surgeon: Marcheta Grammes, DPM;  Location: [facility];  Service: Podiatry;  Laterality: Left;  . ANTERIOR CERVICAL DECOMP/DISCECTOMY FUSION N/A 12/29/2016   Procedure: Cervical five-six Cervical six-seven Anterior cervical discectomy with fusion and plate fixation;  Surgeon: Ditty, Kevan Ny, MD;  Location: [facility];  Service: Neurosurgery;  Laterality: N/A;  . APPENDECTOMY    . BACK  SURGERY  1999   x 6 some fusions  . BIOPSY  12/18/2017   Procedure: BIOPSY;  Surgeon: Rogene Houston, MD;  [facility];  Service: Endoscopy;;  gastric and esophagus  . colonscopy    . ESOPHAGOGASTRODUODENOSCOPY (EGD) WITH PROPOFOL N/A 12/18/2017   Procedure: ESOPHAGOGASTRODUODENOSCOPY (EGD) WITH PROPOFOL;  Surgeon: Rogene Houston, MD;  [facility];  Service: Endoscopy;  Laterality: N/A;  9:25  . FOOT ARTHRODESIS Right 01/11/2014   Procedure: ARTHRODESIS INTERPHALANGEAL JOINT HALLUX RIGHT FOOT;  Surgeon: Marcheta Grammes, DPM;  Location: [facility];  Service: Podiatry;  Laterality: Right;  . HARDWARE REMOVAL Right 01/17/2015   Procedure: HARDWARE REMOVAL;  Surgeon: Caprice Beaver, DPM;  Location: [facility];  Service: Podiatry;  Laterality: Right;  . I&D EXTREMITY Left 12/22/2018   Procedure: LEFT PARTIAL CALCANEAL EXCISION;  Surgeon: Newt Minion, MD;  Location: [facility];  Service: Orthopedics;  Laterality: Left;  . KNEE ARTHROSCOPY Right 10/2015  . LUMBAR LAMINECTOMY/DECOMPRESSION MICRODISCECTOMY Right 01/12/2013   Procedure: Right Lumbar Three-Four Laminotomy/Foraminotomy;  Surgeon: Floyce Stakes, MD;  [facility];  Service: Neurosurgery;  Laterality: Right;  Right Lumbar Three-Four Laminotomy/Foraminotomy  . NISSEN FUNDOPLICATION    . SHOULDER ARTHROSCOPY W/ ROTATOR CUFF REPAIR Right   . SHOULDER ARTHROSCOPY WITH DISTAL  CLAVICLE RESECTION Right 10/04/2018   Procedure: RIGHT SHOULDER ARTHROSCOPY WITH DISTAL CLAVICLE RESECTION, EXTENSIVE DEBRIDEMENT, SUBACROMIAL PARTIAL ACROMIOPLASTY,;  Surgeon: Earlie Server, MD;  Location: [facility];  Service: Orthopedics;  Laterality: Right;  PRE/POST OP SCALENE   Social History   Occupational History  . Occupation: Retired- Research officer, trade union  Tobacco Use  . Smoking status: Former Smoker    Packs/day: 3.00    Years: 5.00    Pack years: 15.00    Types: Cigarettes    Start date: 06/20/1977    Quit date: 07/20/1982    Years since quitting: 36.5  . Smokeless tobacco: Never Used  . Tobacco comment: quit 1980's  Substance and Sexual Activity  . Alcohol use: No    Alcohol/week: 0.0 standard drinks  . Drug use: No  . Sexual activity: Yes    Birth control/protection: None

## 2019-01-11 ENCOUNTER — Other Ambulatory Visit: Payer: Self-pay

## 2019-01-11 ENCOUNTER — Encounter (HOSPITAL_COMMUNITY): Payer: Self-pay | Admitting: *Deleted

## 2019-01-11 MED ORDER — DEXTROSE 5 % IV SOLN
3.0000 g | INTRAVENOUS | Status: AC
  Administered 2019-01-12: 11:00:00 3 g via INTRAVENOUS
  Filled 2019-01-11: qty 3000
  Filled 2019-01-11: qty 3

## 2019-01-11 NOTE — Progress Notes (Signed)
Spoke with pt for pre-op call. Pt denies cardiac history, chest pain or sob. Pt is a Type 2 Diabetic. Last A1C was 8.0 within the last 3 months. Pt states his fasting blood sugar is usually between 120-140. Instructed pt to take 1/2 of his regular dose of Levemir Insulin tonight. He will take 33 units. Instructed pt to check his blood sugar when he gets up in the AM and every 2 hours until he leaves for the hospital. If blood sugar is 70 or below, treat with 1/2 cup of clear juice (apple or cranberry) and recheck blood sugar 15 minutes after drinking juice. If blood sugar continues to be 70 or below, call the Short Stay department and ask to speak to a nurse. Pt voiced understanding.  Pt instructed on ERAS protocol, no food after midnight, clear liquids until 8 AM and pt to drink 10 ounce of water between 7:45 and 8:00 AM, then NPO. Pt voiced understanding.   Pt informed that visitation policy is the same as it was when he was here a month ago.   Pt had his Covid test done yesterday, it is negative. Pt states he's been in quarantine since the test was done and understands he needs to stay in quarantine until he comes to the hospital in the AM.

## 2019-01-12 ENCOUNTER — Inpatient Hospital Stay (HOSPITAL_COMMUNITY): Payer: Medicare HMO | Admitting: Certified Registered"

## 2019-01-12 ENCOUNTER — Inpatient Hospital Stay (HOSPITAL_COMMUNITY)
Admission: RE | Admit: 2019-01-12 | Discharge: 2019-01-17 | DRG: 240 | Disposition: A | Discharge status: Home Health | Payer: Medicare HMO | Attending: Orthopedic Surgery | Admitting: Orthopedic Surgery

## 2019-01-12 ENCOUNTER — Other Ambulatory Visit: Payer: Self-pay | Admitting: Physician Assistant

## 2019-01-12 ENCOUNTER — Encounter (HOSPITAL_COMMUNITY)
Admission: RE | Disposition: A | Discharge status: Home / Self Care | Payer: Self-pay | Source: Home / Self Care | Attending: Orthopedic Surgery

## 2019-01-12 ENCOUNTER — Encounter (HOSPITAL_COMMUNITY): Payer: Self-pay

## 2019-01-12 DIAGNOSIS — M86272 Subacute osteomyelitis, left ankle and foot: Secondary | ICD-10-CM | POA: Diagnosis present

## 2019-01-12 DIAGNOSIS — Z7982 Long term (current) use of aspirin: Secondary | ICD-10-CM

## 2019-01-12 DIAGNOSIS — Z23 Encounter for immunization: Secondary | ICD-10-CM | POA: Diagnosis not present

## 2019-01-12 DIAGNOSIS — E1169 Type 2 diabetes mellitus with other specified complication: Secondary | ICD-10-CM | POA: Diagnosis not present

## 2019-01-12 DIAGNOSIS — Z8673 Personal history of transient ischemic attack (TIA), and cerebral infarction without residual deficits: Secondary | ICD-10-CM

## 2019-01-12 DIAGNOSIS — Z8249 Family history of ischemic heart disease and other diseases of the circulatory system: Secondary | ICD-10-CM

## 2019-01-12 DIAGNOSIS — Z20828 Contact with and (suspected) exposure to other viral communicable diseases: Secondary | ICD-10-CM | POA: Diagnosis present

## 2019-01-12 DIAGNOSIS — T8131XA Disruption of external operation (surgical) wound, not elsewhere classified, initial encounter: Secondary | ICD-10-CM | POA: Diagnosis not present

## 2019-01-12 DIAGNOSIS — I96 Gangrene, not elsewhere classified: Secondary | ICD-10-CM | POA: Diagnosis present

## 2019-01-12 DIAGNOSIS — Z833 Family history of diabetes mellitus: Secondary | ICD-10-CM

## 2019-01-12 DIAGNOSIS — E1152 Type 2 diabetes mellitus with diabetic peripheral angiopathy with gangrene: Secondary | ICD-10-CM | POA: Diagnosis not present

## 2019-01-12 DIAGNOSIS — Z8349 Family history of other endocrine, nutritional and metabolic diseases: Secondary | ICD-10-CM | POA: Diagnosis not present

## 2019-01-12 DIAGNOSIS — Z79899 Other long term (current) drug therapy: Secondary | ICD-10-CM

## 2019-01-12 DIAGNOSIS — I10 Essential (primary) hypertension: Secondary | ICD-10-CM | POA: Diagnosis present

## 2019-01-12 DIAGNOSIS — Y838 Other surgical procedures as the cause of abnormal reaction of the patient, or of later complication, without mention of misadventure at the time of the procedure: Secondary | ICD-10-CM | POA: Diagnosis not present

## 2019-01-12 DIAGNOSIS — Z87891 Personal history of nicotine dependence: Secondary | ICD-10-CM

## 2019-01-12 DIAGNOSIS — M2550 Pain in unspecified joint: Secondary | ICD-10-CM | POA: Diagnosis not present

## 2019-01-12 DIAGNOSIS — E119 Type 2 diabetes mellitus without complications: Secondary | ICD-10-CM | POA: Diagnosis not present

## 2019-01-12 DIAGNOSIS — E785 Hyperlipidemia, unspecified: Secondary | ICD-10-CM | POA: Diagnosis present

## 2019-01-12 DIAGNOSIS — Z981 Arthrodesis status: Secondary | ICD-10-CM

## 2019-01-12 DIAGNOSIS — N183 Chronic kidney disease, stage 3 unspecified: Secondary | ICD-10-CM | POA: Diagnosis not present

## 2019-01-12 DIAGNOSIS — Z888 Allergy status to other drugs, medicaments and biological substances status: Secondary | ICD-10-CM

## 2019-01-12 DIAGNOSIS — K219 Gastro-esophageal reflux disease without esophagitis: Secondary | ICD-10-CM | POA: Diagnosis present

## 2019-01-12 DIAGNOSIS — T8132XA Disruption of internal operation (surgical) wound, not elsewhere classified, initial encounter: Secondary | ICD-10-CM | POA: Diagnosis not present

## 2019-01-12 DIAGNOSIS — R5381 Other malaise: Secondary | ICD-10-CM | POA: Diagnosis not present

## 2019-01-12 DIAGNOSIS — I129 Hypertensive chronic kidney disease with stage 1 through stage 4 chronic kidney disease, or unspecified chronic kidney disease: Secondary | ICD-10-CM | POA: Diagnosis not present

## 2019-01-12 DIAGNOSIS — Z7984 Long term (current) use of oral hypoglycemic drugs: Secondary | ICD-10-CM | POA: Diagnosis not present

## 2019-01-12 DIAGNOSIS — Z7401 Bed confinement status: Secondary | ICD-10-CM | POA: Diagnosis not present

## 2019-01-12 DIAGNOSIS — L905 Scar conditions and fibrosis of skin: Secondary | ICD-10-CM | POA: Diagnosis not present

## 2019-01-12 HISTORY — PX: AMPUTATION: SHX166

## 2019-01-12 LAB — BASIC METABOLIC PANEL
Anion gap: 12 (ref 5–15)
BUN: 31 mg/dL — ABNORMAL HIGH (ref 6–20)
CO2: 22 mmol/L (ref 22–32)
Calcium: 9 mg/dL (ref 8.9–10.3)
Chloride: 105 mmol/L (ref 98–111)
Creatinine, Ser: 2.03 mg/dL — ABNORMAL HIGH (ref 0.61–1.24)
GFR calc Af Amer: 40 mL/min — ABNORMAL LOW (ref >60)
GFR calc non Af Amer: 35 mL/min — ABNORMAL LOW (ref >60)
Glucose, Bld: 132 mg/dL — ABNORMAL HIGH (ref 70–99)
Potassium: 4.1 mmol/L (ref 3.5–5.1)
Sodium: 139 mmol/L (ref 135–145)

## 2019-01-12 LAB — GLUCOSE, CAPILLARY
Glucose-Capillary: 127 mg/dL — ABNORMAL HIGH (ref 70–99)
Glucose-Capillary: 85 mg/dL (ref 70–99)
Glucose-Capillary: 237 mg/dL — ABNORMAL HIGH (ref 70–99)
Glucose-Capillary: 338 mg/dL — ABNORMAL HIGH (ref 70–99)

## 2019-01-12 LAB — CBC
HCT: 34.7 % — ABNORMAL LOW (ref 39.0–52.0)
Hemoglobin: 10.8 g/dL — ABNORMAL LOW (ref 13.0–17.0)
MCH: 25.5 pg — ABNORMAL LOW (ref 26.0–34.0)
MCHC: 31.1 g/dL (ref 30.0–36.0)
MCV: 81.8 fL (ref 80.0–100.0)
Platelets: 349 10*3/uL (ref 150–400)
RBC: 4.24 MIL/uL (ref 4.22–5.81)
RDW: 14.5 % (ref 11.5–15.5)
WBC: 6.6 10*3/uL (ref 4.0–10.5)
nRBC: 0 % (ref 0.0–0.2)

## 2019-01-12 LAB — HEMOGLOBIN A1C
Hgb A1c MFr Bld: 8.9 % — ABNORMAL HIGH (ref 4.8–5.6)
Mean Plasma Glucose: 208.73 mg/dL

## 2019-01-12 SURGERY — AMPUTATION BELOW KNEE
Anesthesia: General | Site: Knee | Laterality: Left

## 2019-01-12 MED ORDER — ONDANSETRON HCL 4 MG/2ML IJ SOLN: 4.0000 mg | Freq: Four times a day (QID) | INTRAMUSCULAR | Status: DC | PRN

## 2019-01-12 MED ORDER — INSULIN ASPART 100 UNIT/ML ~~LOC~~ SOLN
0.0000 [IU] | Freq: Three times a day (TID) | SUBCUTANEOUS | Status: DC
  Administered 2019-01-12: 5 [IU] via SUBCUTANEOUS
  Administered 2019-01-13: 3 [IU] via SUBCUTANEOUS
  Administered 2019-01-13 (×2): 8 [IU] via SUBCUTANEOUS
  Administered 2019-01-14: 2 [IU] via SUBCUTANEOUS
  Administered 2019-01-14: 14:00:00 3 [IU] via SUBCUTANEOUS
  Administered 2019-01-15 – 2019-01-16 (×3): 2 [IU] via SUBCUTANEOUS
  Administered 2019-01-16: 17:00:00 5 [IU] via SUBCUTANEOUS
  Administered 2019-01-16: 3 [IU] via SUBCUTANEOUS
  Administered 2019-01-17: 2 [IU] via SUBCUTANEOUS

## 2019-01-12 MED ORDER — RANOLAZINE ER 500 MG PO TB12
1000.0000 mg | ORAL_TABLET | Freq: Every day | ORAL | Status: DC
  Administered 2019-01-13: 10:00:00 500 mg via ORAL
  Administered 2019-01-14 – 2019-01-17 (×4): 1000 mg via ORAL
  Filled 2019-01-12 (×5): qty 2

## 2019-01-12 MED ORDER — INSULIN ASPART 100 UNIT/ML ~~LOC~~ SOLN
4.0000 [IU] | Freq: Three times a day (TID) | SUBCUTANEOUS | Status: DC
  Administered 2019-01-12 – 2019-01-17 (×13): 4 [IU] via SUBCUTANEOUS

## 2019-01-12 MED ORDER — OMEGA-3-ACID ETHYL ESTERS 1 G PO CAPS
1.0000 g | ORAL_CAPSULE | Freq: Every day | ORAL | Status: DC
  Administered 2019-01-13 – 2019-01-17 (×5): 1 g via ORAL
  Filled 2019-01-12 (×5): qty 1

## 2019-01-12 MED ORDER — DEXAMETHASONE SODIUM PHOSPHATE 10 MG/ML IJ SOLN
INTRAMUSCULAR | Status: AC
  Filled 2019-01-12: qty 1

## 2019-01-12 MED ORDER — ONDANSETRON HCL 4 MG/2ML IJ SOLN
INTRAMUSCULAR | Status: AC
  Filled 2019-01-12: qty 2

## 2019-01-12 MED ORDER — OXYCODONE HCL 5 MG PO TABS
ORAL_TABLET | ORAL | Status: AC
  Filled 2019-01-12: qty 1

## 2019-01-12 MED ORDER — CANAGLIFLOZIN 100 MG PO TABS
100.0000 mg | ORAL_TABLET | Freq: Every day | ORAL | Status: DC
  Administered 2019-01-13 – 2019-01-17 (×5): 100 mg via ORAL
  Filled 2019-01-12 (×5): qty 1

## 2019-01-12 MED ORDER — ONDANSETRON HCL 4 MG/2ML IJ SOLN
INTRAMUSCULAR | Status: DC | PRN
  Administered 2019-01-12: 4 mg via INTRAVENOUS

## 2019-01-12 MED ORDER — LIDOCAINE 2% (20 MG/ML) 5 ML SYRINGE
INTRAMUSCULAR | Status: AC
  Filled 2019-01-12: qty 5

## 2019-01-12 MED ORDER — HYDROMORPHONE HCL 1 MG/ML IJ SOLN
INTRAMUSCULAR | Status: DC | PRN
  Administered 2019-01-12: 0.5 mg via INTRAVENOUS

## 2019-01-12 MED ORDER — SUCCINYLCHOLINE CHLORIDE 200 MG/10ML IV SOSY
PREFILLED_SYRINGE | INTRAVENOUS | Status: AC
  Filled 2019-01-12: qty 10

## 2019-01-12 MED ORDER — ATORVASTATIN CALCIUM 40 MG PO TABS
40.0000 mg | ORAL_TABLET | Freq: Every day | ORAL | Status: DC
  Administered 2019-01-12 – 2019-01-17 (×6): 40 mg via ORAL
  Filled 2019-01-12 (×6): qty 1

## 2019-01-12 MED ORDER — FENTANYL CITRATE (PF) 250 MCG/5ML IJ SOLN
INTRAMUSCULAR | Status: AC
  Filled 2019-01-12: qty 5

## 2019-01-12 MED ORDER — LACTATED RINGERS IV SOLN
INTRAVENOUS | Status: DC | PRN
  Administered 2019-01-12: 10:00:00 via INTRAVENOUS

## 2019-01-12 MED ORDER — MIDAZOLAM HCL 5 MG/5ML IJ SOLN
INTRAMUSCULAR | Status: DC | PRN
  Administered 2019-01-12: 2 mg via INTRAVENOUS

## 2019-01-12 MED ORDER — 0.9 % SODIUM CHLORIDE (POUR BTL) OPTIME
TOPICAL | Status: DC | PRN
  Administered 2019-01-12: 10:00:00 1000 mL

## 2019-01-12 MED ORDER — INSULIN DETEMIR 100 UNIT/ML ~~LOC~~ SOLN
34.0000 [IU] | Freq: Every day | SUBCUTANEOUS | Status: DC
  Administered 2019-01-12: 22:00:00 34 [IU] via SUBCUTANEOUS
  Filled 2019-01-12 (×2): qty 0.34

## 2019-01-12 MED ORDER — LIDOCAINE 2% (20 MG/ML) 5 ML SYRINGE
INTRAMUSCULAR | Status: DC | PRN
  Administered 2019-01-12: 60 mg via INTRAVENOUS

## 2019-01-12 MED ORDER — FERROUS SULFATE 325 (65 FE) MG PO TABS
325.0000 mg | ORAL_TABLET | Freq: Every day | ORAL | Status: DC
  Administered 2019-01-13 – 2019-01-17 (×5): 325 mg via ORAL
  Filled 2019-01-12 (×5): qty 1

## 2019-01-12 MED ORDER — GABAPENTIN 300 MG PO CAPS
300.0000 mg | ORAL_CAPSULE | Freq: Three times a day (TID) | ORAL | Status: DC
  Administered 2019-01-12 – 2019-01-17 (×15): 300 mg via ORAL
  Filled 2019-01-12 (×15): qty 1

## 2019-01-12 MED ORDER — CEFAZOLIN SODIUM-DEXTROSE 2-4 GM/100ML-% IV SOLN
2.0000 g | Freq: Four times a day (QID) | INTRAVENOUS | Status: AC
  Administered 2019-01-12 – 2019-01-13 (×3): 2 g via INTRAVENOUS
  Filled 2019-01-12 (×3): qty 100

## 2019-01-12 MED ORDER — SODIUM CHLORIDE 0.9 % IV SOLN: INTRAVENOUS | Status: DC

## 2019-01-12 MED ORDER — MIDAZOLAM HCL 2 MG/2ML IJ SOLN
INTRAMUSCULAR | Status: AC
  Filled 2019-01-12: qty 2

## 2019-01-12 MED ORDER — PROPOFOL 10 MG/ML IV BOLUS
INTRAVENOUS | Status: AC
  Filled 2019-01-12: qty 20

## 2019-01-12 MED ORDER — PROPOFOL 10 MG/ML IV BOLUS
INTRAVENOUS | Status: DC | PRN
  Administered 2019-01-12: 200 mg via INTRAVENOUS

## 2019-01-12 MED ORDER — OXYCODONE HCL 5 MG PO TABS
5.0000 mg | ORAL_TABLET | Freq: Once | ORAL | Status: AC | PRN
  Administered 2019-01-12: 5 mg via ORAL

## 2019-01-12 MED ORDER — SODIUM BICARBONATE 650 MG PO TABS
650.0000 mg | ORAL_TABLET | Freq: Two times a day (BID) | ORAL | Status: DC
  Administered 2019-01-12 – 2019-01-17 (×10): 650 mg via ORAL
  Filled 2019-01-12 (×10): qty 1

## 2019-01-12 MED ORDER — ACETAMINOPHEN 325 MG PO TABS
325.0000 mg | ORAL_TABLET | Freq: Four times a day (QID) | ORAL | Status: DC | PRN
  Administered 2019-01-14: 650 mg via ORAL
  Filled 2019-01-12: qty 2

## 2019-01-12 MED ORDER — PROMETHAZINE HCL 25 MG/ML IJ SOLN
INTRAMUSCULAR | Status: AC
  Filled 2019-01-12: qty 1

## 2019-01-12 MED ORDER — ONDANSETRON HCL 4 MG PO TABS: 4.0000 mg | ORAL_TABLET | Freq: Four times a day (QID) | ORAL | Status: DC | PRN

## 2019-01-12 MED ORDER — HYDROMORPHONE HCL 1 MG/ML IJ SOLN
0.5000 mg | INTRAMUSCULAR | Status: DC | PRN
  Administered 2019-01-12 – 2019-01-16 (×8): 0.5 mg via INTRAVENOUS
  Filled 2019-01-12 (×8): qty 1

## 2019-01-12 MED ORDER — HYDROMORPHONE HCL 1 MG/ML IJ SOLN
INTRAMUSCULAR | Status: AC
  Filled 2019-01-12: qty 1

## 2019-01-12 MED ORDER — DEXAMETHASONE SODIUM PHOSPHATE 10 MG/ML IJ SOLN
INTRAMUSCULAR | Status: DC | PRN
  Administered 2019-01-12: 10 mg via INTRAVENOUS

## 2019-01-12 MED ORDER — DOCUSATE SODIUM 100 MG PO CAPS
100.0000 mg | ORAL_CAPSULE | Freq: Two times a day (BID) | ORAL | Status: DC
  Administered 2019-01-12 – 2019-01-17 (×7): 100 mg via ORAL
  Filled 2019-01-12 (×10): qty 1

## 2019-01-12 MED ORDER — ASPIRIN EC 81 MG PO TBEC
81.0000 mg | DELAYED_RELEASE_TABLET | Freq: Every day | ORAL | Status: DC
  Administered 2019-01-12 – 2019-01-17 (×6): 81 mg via ORAL
  Filled 2019-01-12 (×6): qty 1

## 2019-01-12 MED ORDER — HYDROMORPHONE HCL 1 MG/ML IJ SOLN
0.2500 mg | INTRAMUSCULAR | Status: DC | PRN
  Administered 2019-01-12 (×6): 0.25 mg via INTRAVENOUS

## 2019-01-12 MED ORDER — CHLORHEXIDINE GLUCONATE 4 % EX LIQD: 60.0000 mL | Freq: Once | CUTANEOUS | Status: DC

## 2019-01-12 MED ORDER — HYDROMORPHONE HCL 1 MG/ML IJ SOLN
INTRAMUSCULAR | Status: AC
  Filled 2019-01-12: qty 0.5

## 2019-01-12 MED ORDER — PROMETHAZINE HCL 25 MG/ML IJ SOLN
6.2500 mg | INTRAMUSCULAR | Status: DC | PRN
  Administered 2019-01-12: 6.25 mg via INTRAVENOUS

## 2019-01-12 MED ORDER — INFLUENZA VAC SPLIT QUAD 0.5 ML IM SUSY
0.5000 mL | PREFILLED_SYRINGE | INTRAMUSCULAR | Status: AC
  Administered 2019-01-13: 0.5 mL via INTRAMUSCULAR
  Filled 2019-01-12: qty 0.5

## 2019-01-12 MED ORDER — METHOCARBAMOL 500 MG PO TABS
ORAL_TABLET | ORAL | Status: AC
  Filled 2019-01-12: qty 1

## 2019-01-12 MED ORDER — FENTANYL CITRATE (PF) 100 MCG/2ML IJ SOLN
INTRAMUSCULAR | Status: DC | PRN
  Administered 2019-01-12 (×2): 50 ug via INTRAVENOUS

## 2019-01-12 MED ORDER — OXYCODONE HCL 5 MG/5ML PO SOLN: 5.0000 mg | Freq: Once | ORAL | Status: AC | PRN

## 2019-01-12 MED ORDER — METHOCARBAMOL 500 MG PO TABS
500.0000 mg | ORAL_TABLET | Freq: Four times a day (QID) | ORAL | Status: DC | PRN
  Administered 2019-01-12 – 2019-01-15 (×7): 500 mg via ORAL
  Filled 2019-01-12 (×7): qty 1

## 2019-01-12 MED ORDER — GLIPIZIDE 10 MG PO TABS
10.0000 mg | ORAL_TABLET | Freq: Every day | ORAL | Status: DC
  Administered 2019-01-13 – 2019-01-17 (×5): 10 mg via ORAL
  Filled 2019-01-12 (×3): qty 1
  Filled 2019-01-12 (×5): qty 2
  Filled 2019-01-12 (×2): qty 1

## 2019-01-12 MED ORDER — OXYCODONE HCL 5 MG PO TABS
5.0000 mg | ORAL_TABLET | ORAL | Status: DC | PRN
  Administered 2019-01-12 – 2019-01-17 (×16): 10 mg via ORAL
  Filled 2019-01-12 (×17): qty 2

## 2019-01-12 MED ORDER — SODIUM CHLORIDE 0.9 % IV SOLN
INTRAVENOUS | Status: DC
  Administered 2019-01-12: 14:00:00 via INTRAVENOUS

## 2019-01-12 SURGICAL SUPPLY — 37 items
BLADE SAW RECIP 87.9 MT (BLADE) ×2 IMPLANT
BLADE SURG 21 STRL SS (BLADE) ×2 IMPLANT
BNDG COHESIVE 6X5 TAN STRL LF (GAUZE/BANDAGES/DRESSINGS) IMPLANT
CANISTER WOUND CARE 500ML ATS (WOUND CARE) ×2 IMPLANT
COVER SURGICAL LIGHT HANDLE (MISCELLANEOUS) ×2 IMPLANT
COVER WAND RF STERILE (DRAPES) IMPLANT
CUFF TOURN SGL QUICK 34 (TOURNIQUET CUFF) ×2
CUFF TRNQT CYL 34X4.125X (TOURNIQUET CUFF) ×1 IMPLANT
DRAPE INCISE IOBAN 66X45 STRL (DRAPES) ×2 IMPLANT
DRAPE U-SHAPE 47X51 STRL (DRAPES) ×2 IMPLANT
DRESSING PREVENA PLUS CUSTOM (GAUZE/BANDAGES/DRESSINGS) ×1 IMPLANT
DRSG PREVENA PLUS CUSTOM (GAUZE/BANDAGES/DRESSINGS) ×2
DURAPREP 26ML APPLICATOR (WOUND CARE) ×2 IMPLANT
ELECT REM PT RETURN 9FT ADLT (ELECTROSURGICAL) ×2
ELECTRODE REM PT RTRN 9FT ADLT (ELECTROSURGICAL) ×1 IMPLANT
GLOVE BIOGEL PI IND STRL 9 (GLOVE) ×1 IMPLANT
GLOVE BIOGEL PI INDICATOR 9 (GLOVE) ×1
GLOVE SURG ORTHO 9.0 STRL STRW (GLOVE) ×2 IMPLANT
GOWN STRL REUS W/ TWL XL LVL3 (GOWN DISPOSABLE) ×2 IMPLANT
GOWN STRL REUS W/TWL XL LVL3 (GOWN DISPOSABLE) ×4
KIT BASIN OR (CUSTOM PROCEDURE TRAY) ×2 IMPLANT
KIT TURNOVER KIT B (KITS) ×2 IMPLANT
MANIFOLD NEPTUNE II (INSTRUMENTS) ×2 IMPLANT
NS IRRIG 1000ML POUR BTL (IV SOLUTION) ×2 IMPLANT
PACK ORTHO EXTREMITY (CUSTOM PROCEDURE TRAY) ×2 IMPLANT
PAD ARMBOARD 7.5X6 YLW CONV (MISCELLANEOUS) ×2 IMPLANT
PREVENA RESTOR ARTHOFORM 46X30 (CANNISTER) ×2 IMPLANT
SPONGE LAP 18X18 RF (DISPOSABLE) IMPLANT
STAPLER VISISTAT 35W (STAPLE) IMPLANT
STOCKINETTE IMPERVIOUS LG (DRAPES) ×2 IMPLANT
SUT ETHILON 2 0 PSLX (SUTURE) IMPLANT
SUT SILK 2 0 (SUTURE) ×2
SUT SILK 2-0 18XBRD TIE 12 (SUTURE) ×1 IMPLANT
SUT VIC AB 1 CTX 27 (SUTURE) ×4 IMPLANT
TOWEL GREEN STERILE (TOWEL DISPOSABLE) ×2 IMPLANT
TUBE CONNECTING 12X1/4 (SUCTIONS) ×2 IMPLANT
YANKAUER SUCT BULB TIP NO VENT (SUCTIONS) ×2 IMPLANT

## 2019-01-12 NOTE — Anesthesia Preprocedure Evaluation (Signed)
Anesthesia Evaluation  Patient identified by MRN, date of birth, ID band Patient awake    Reviewed: Allergy & Precautions, H&P , NPO status , Patient's Chart, lab work & pertinent test results, reviewed documented beta blocker date and time   History of Anesthesia Complications (+) PROLONGED EMERGENCE and history of anesthetic complications  Airway Mallampati: I  TM Distance: >3 FB Neck ROM: full    Dental no notable dental hx. (+) Teeth Intact, Dental Advisory Given   Pulmonary neg pulmonary ROS, former smoker,    Pulmonary exam normal breath sounds clear to auscultation       Cardiovascular Exercise Tolerance: Good hypertension, + angina + Peripheral Vascular Disease  negative cardio ROS   Rhythm:regular Rate:Normal  EKG: 04/06/18: ST at 105 bpm  Cardiac event monitor 03/30/17-04/28/17: Study Highlights  Sinus rhythm with rare PAC's and one 6-beat run of non-sustained ventricular tachycardia. (If symptomatic of palpitations, consider beta-blocker, otherwise no change in therapy recommended Ahmed Prima, [place], PA-C 03/31/17)    Echo 03/14/17: - Left ventricle: Inferobasal hypokinesis The cavity size was normal. There was mild focal basal hypertrophy of the septum. Systolic function was normal. The estimated ejection fraction was in the range of 55% to 60%. Wall motion was normal; there were no regional wall motion abnormalities. Doppler parameters are consistent with abnormal left ventricular relaxation (grade 1 diastolic dysfunction). - Mitral valve: Valve area by pressure half-time: 1.55 cm^2. - Left atrium: The atrium was mildly dilated. (Comparison 08/31/14: LVEF 60-65%, inferior basal hypokinesis)   Nuclear stress test 01/10/15:  There was no ST segment deviation noted during stress.  This is a low risk study. No ischemia or myocardial scar.  Nuclear stress EF: 54%. Normal regional wall  motion.  Defect 1: There is a defect present in the basal inferior, basal inferolateral and mid inferior location, consistent with soft tissue attenuation artifact.  Cardiac cath 07/02/10: Final Interpretation: 1. No significant obstructive atherosclerotic coronary artery disease(OM3 up to 40% stenosis). 2. Normal left ventricular function. EF 60%.   Neuro/Psych  Headaches,  Neuromuscular disease CVA negative neurological ROS  negative psych ROS   GI/Hepatic negative GI ROS, Neg liver ROS, GERD  ,  Endo/Other  diabetes, Type 2Morbid obesity  Renal/GU Renal diseasenegative Renal ROS  negative genitourinary   Musculoskeletal negative musculoskeletal ROS (+) Arthritis , Osteoarthritis,  Fibromyalgia -  Abdominal (+) + obese,   Peds negative pediatric ROS (+)  Hematology negative hematology ROS (+)   Anesthesia Other Findings   Reproductive/Obstetrics negative OB ROS                             Anesthesia Physical  Anesthesia Plan  ASA: III  Anesthesia Plan: General   Post-op Pain Management:    Induction: Intravenous  PONV Risk Score and Plan: 2 and Treatment may vary due to age or medical condition, Ondansetron and Midazolam  Airway Management Planned: LMA  Additional Equipment:   Intra-op Plan:   Post-operative Plan: Extubation in OR  Informed Consent: I have reviewed the patients History and Physical, chart, labs and discussed the procedure including the risks, benefits and alternatives for the proposed anesthesia with the patient or authorized representative who has indicated his/her understanding and acceptance.     Dental Advisory Given  Plan Discussed with: CRNA, Anesthesiologist and Surgeon  Anesthesia Plan Comments: (PAT note written 12/21/2018 by Myra Gianotti, PA-C. )        Anesthesia Quick Evaluation

## 2019-01-12 NOTE — Op Note (Signed)
   Date of Surgery: 01/12/2019  INDICATIONS: Mr. [name] is a 58 y.o.-year-old male who is status post limb salvage surgery with partial calcaneal excision for osteomyelitis.  Patient has had progressive wound dehiscence despite good circulation of the ankle and presents at this time for transtibial amputation.[name]  PREOPERATIVE DIAGNOSIS: Osteomyelitis wound dehiscence left heel  POSTOPERATIVE DIAGNOSIS: Same.  PROCEDURE: Transtibial amputation Application of Prevena wound VAC  SURGEON: Sharol Given, M.D.  ANESTHESIA:  general  IV FLUIDS AND URINE: See anesthesia.  ESTIMATED BLOOD LOSS: Minimal mL.  COMPLICATIONS: None.  DESCRIPTION OF PROCEDURE: The patient was brought to the operating room and underwent a general anesthetic. After adequate levels of anesthesia were obtained patient's lower extremity was prepped using DuraPrep draped into a sterile field. A timeout was called. The foot was draped out of the sterile field with impervious stockinette. A transverse incision was made 11 cm distal to the tibial tubercle. This curved proximally and a large posterior flap was created. The tibia was transected 1 cm proximal to the skin incision. The fibula was transected just proximal to the tibial incision. The tibia was beveled anteriorly. A large posterior flap was created. The sciatic nerve was pulled cut and allowed to retract. The vascular bundles were suture ligated with 2-0 silk. The deep and superficial fascial layers were closed using #1 Vicryl. The skin was closed using staples and 2-0 nylon. The wound was covered with a Prevena wound VAC. There was a good suction fit. A prosthetic shrinker was applied. Patient was extubated taken to the PACU in stable condition.   DISCHARGE PLANNING:  Antibiotic duration: 24 hours  Weightbearing: Nonweightbearing on the left  Pain medication: Opioid pathway  Dressing care/ Wound VAC: Wound VAC x1 week  Discharge to: Possible discharge to home versus  inpatient rehabilitation.  Follow-up: In the office 1 week post operative.  Meridee Score, MD [facility] 11:19 AM

## 2019-01-12 NOTE — Anesthesia Postprocedure Evaluation (Signed)
Anesthesia Post Note  Patient: [name]  Procedure(s) Performed: LEFT BELOW KNEE AMPUTATION (Left Knee)     Patient location during evaluation: PACU Anesthesia Type: General Level of consciousness: awake and alert Pain management: pain level controlled Vital Signs Assessment: post-procedure vital signs reviewed and stable Respiratory status: spontaneous breathing, nonlabored ventilation and respiratory function stable Cardiovascular status: blood pressure returned to baseline and stable Postop Assessment: no apparent nausea or vomiting Anesthetic complications: no    Last Vitals:  Vitals:   01/12/19 1205 01/12/19 1220  BP: 136/79 128/85  Pulse: (!) 50 (!) 50  Resp: 12 14  Temp:  (!) 36.4 C  SpO2: 98% 100%    Last Pain:  Vitals:   01/12/19 1220  TempSrc:   PainSc: 7     LLE Motor Response: Purposeful movement;Responds to commands (01/12/19 1220) LLE Sensation: Full sensation (01/12/19 1220)          Lynda Rainwater

## 2019-01-12 NOTE — Plan of Care (Signed)
Wound vac with no output yet, pt eating okay, sleeve applied to stump   Problem: Education: Goal: Knowledge of General Education information will improve Description: Including pain rating scale, medication(s)/side effects and non-pharmacologic comfort measures Outcome: Progressing   Problem: Clinical Measurements: Goal: Respiratory complications will improve Outcome: Progressing Note: On room air   Problem: Activity: Goal: Risk for activity intolerance will decrease Outcome: Progressing Note: Up to side of bed   Problem: Nutrition: Goal: Adequate nutrition will be maintained Outcome: Progressing   Problem: Coping: Goal: Level of anxiety will decrease Outcome: Progressing   Problem: Pain Managment: Goal: General experience of comfort will improve Outcome: Progressing Note: Received PRN pain medication twice   Problem: Safety: Goal: Ability to remain free from injury will improve Outcome: Progressing

## 2019-01-12 NOTE — Anesthesia Procedure Notes (Signed)
Procedure Name: LMA Insertion Date/Time: 01/12/2019 10:42 AM Performed by: Lance Coon, CRNA Pre-anesthesia Checklist: Patient identified, Emergency Drugs available, Suction available, Patient being monitored and Timeout performed Patient Re-evaluated:Patient Re-evaluated prior to induction Oxygen Delivery Method: Circle system utilized Preoxygenation: Pre-oxygenation with 100% oxygen Induction Type: IV induction LMA: LMA inserted LMA Size: 5.0 Number of attempts: 1 Placement Confirmation: positive ETCO2 and breath sounds checked- equal and bilateral Tube secured with: Tape Dental Injury: Teeth and Oropharynx as per pre-operative assessment

## 2019-01-12 NOTE — Transfer of Care (Signed)
Immediate Anesthesia Transfer of Care Note  Patient: [name]  Procedure(s) Performed: LEFT BELOW KNEE AMPUTATION (Left Knee)  Patient Location: PACU  Anesthesia Type:General  Level of Consciousness: drowsy and patient cooperative  Airway & Oxygen Therapy: Patient Spontanous Breathing  Post-op Assessment: Report given to RN and Post -op Vital signs reviewed and stable  Post vital signs: Reviewed and stable  Last Vitals:  Vitals Value Taken Time  BP 112/78 01/12/19 1120  Temp    Pulse 59 01/12/19 1121  Resp 10 01/12/19 1121  SpO2 96 % 01/12/19 1121  Vitals shown include unvalidated device data.  Last Pain:  Vitals:   01/12/19 0758  TempSrc:   PainSc: 0-No pain      Patients Stated Pain Goal: 3 ([id number])  Complications: No apparent anesthesia complications

## 2019-01-12 NOTE — H&P (Signed)
[name] is an 58 y.o. male.   Chief Complaint: Gangrene Left Foot HPIThis is a pleasant gentleman who is now 4 weeks status post left foot partial calcaneal excision.  In his last few visits he has continued to have a nonhealing wound with skin maceration significant drainage and a foul odor.  He has tried a trial of oral antibiotics.  Past Medical History:  Diagnosis Date  . Allergic rhinitis, cause unspecified   . Anginal pain ([place])   . Arthritis   . BPH (benign prostatic hyperplasia)   . Cervicalgia   . Chest pain Sept. 2005   multiple caths  /  cath 06/15/2010..normal coronaries,  EF 60%   (false postive nuclear in the past)  . CKD (chronic kidney disease), stage III   . Complication of anesthesia    Hard time waking up patient stated low blood pressures around 2017  . Dyslipidemia    mixed  . Esophageal reflux   . Fibromyalgia   . Gastroparesis   . Headache(784.0)   . History of kidney stones   . Hyperlipidemia   . Hypertension   . Hypoplasia of one kidney   . IDDM (insulin dependent diabetes mellitus)   . Lumbago   . Neuropathy associated with endocrine disorder ([place])   . Overweight(278.02)   . Peripheral vascular disease ([place])   . Pulmonary embolus (HCC)    Hx of small left lower lobe  pulmonary embolus  . Pulmonary embolus ([place]) 2010ish  . Stroke [facility]    mini stroke aug 2016  . Type II or unspecified type diabetes mellitus with neurological manifestations, not stated as uncontrolled(250.60)   . Urticaria, unspecified   . Wears glasses     Past Surgical History:  Procedure Laterality Date  . ACHILLES TENDON REPAIR Left 2013  . AMPUTATION Left 06/02/2013   Procedure: AMPUTATION 1ST TOE LEFT FOOT;  Surgeon: Marcheta Grammes, DPM;  Location: [facility];  Service: Orthopedics;  Laterality: Left;  . AMPUTATION Left 07/21/2013   Procedure: PARTIAL AMPUTATION 2ND TOE LEFT FOOT;  Surgeon: Marcheta Grammes, DPM;  Location: [facility];  Service: Podiatry;   Laterality: Left;  . ANTERIOR CERVICAL DECOMP/DISCECTOMY FUSION N/A 12/29/2016   Procedure: Cervical five-six Cervical six-seven Anterior cervical discectomy with fusion and plate fixation;  Surgeon: Ditty, Kevan Ny, MD;  Location: [place];  Service: Neurosurgery;  Laterality: N/A;  . APPENDECTOMY    . BACK SURGERY  1999   x 6 some fusions  . BIOPSY  12/18/2017   Procedure: BIOPSY;  Surgeon: Rogene Houston, MD;  [facility];  Service: Endoscopy;;  gastric and esophagus  . colonscopy    . ESOPHAGOGASTRODUODENOSCOPY (EGD) WITH PROPOFOL N/A 12/18/2017   Procedure: ESOPHAGOGASTRODUODENOSCOPY (EGD) WITH PROPOFOL;  Surgeon: Rogene Houston, MD;  [facility];  Service: Endoscopy;  Laterality: N/A;  9:25  . FOOT ARTHRODESIS Right 01/11/2014   Procedure: ARTHRODESIS INTERPHALANGEAL JOINT HALLUX RIGHT FOOT;  Surgeon: Marcheta Grammes, DPM;  Location: [facility];  Service: Podiatry;  Laterality: Right;  . HARDWARE REMOVAL Right 01/17/2015   Procedure: HARDWARE REMOVAL;  Surgeon: Caprice Beaver, DPM;  Location: [facility];  Service: Podiatry;  Laterality: Right;  . I&D EXTREMITY Left 12/22/2018   Procedure: LEFT PARTIAL CALCANEAL EXCISION;  Surgeon: Newt Minion, MD;  Location: [place];  Service: Orthopedics;  Laterality: Left;  . KNEE ARTHROSCOPY Right 10/2015  . LUMBAR LAMINECTOMY/DECOMPRESSION MICRODISCECTOMY Right 01/12/2013   Procedure: Right Lumbar Three-Four Laminotomy/Foraminotomy;  Surgeon:  Floyce Stakes, MD;  Location: [place] [facility];  Service: Neurosurgery;  Laterality: Right;  Right Lumbar Three-Four Laminotomy/Foraminotomy  . NISSEN FUNDOPLICATION    . SHOULDER ARTHROSCOPY W/ ROTATOR CUFF REPAIR Right   . SHOULDER ARTHROSCOPY WITH DISTAL CLAVICLE RESECTION Right 10/04/2018   Procedure: RIGHT SHOULDER ARTHROSCOPY WITH DISTAL CLAVICLE RESECTION, EXTENSIVE DEBRIDEMENT, SUBACROMIAL PARTIAL ACROMIOPLASTY,;  Surgeon: Earlie Server, MD;  Location: [place];  Service: Orthopedics;  Laterality: Right;  PRE/POST OP SCALENE    Family History  Problem Relation Age of Onset  . Heart attack Father 56  . Hyperlipidemia Father   . Hypertension Father   . Heart attack Mother 81  . Diabetes Mother   . Hypertension Mother   . Hyperlipidemia Mother   . Healthy Brother   . Seizures Brother   . Migraines Neg Hx    Social History:  reports that he quit smoking about 36 years ago. His smoking use included cigarettes. He started smoking about 41 years ago. He has a 15.00 pack-year smoking history. He has never used smokeless tobacco. He reports that he does not drink alcohol or use drugs.  Allergies:  Allergies  Allergen Reactions  . [place] Venom [Bee Venom] Swelling  . Reglan [Metoclopramide] Itching and Rash    Medications Prior to Admission  Medication Sig Dispense Refill  . atorvastatin (LIPITOR) 40 MG tablet Take 1 tablet (40 mg total) by mouth daily. 30 tablet 0  . doxycycline (VIBRA-TABS) 100 MG tablet Take 1 tablet (100 mg total) by mouth 2 (two) times daily. 60 tablet 0  . empagliflozin (JARDIANCE) 25 MG TABS tablet Take 25 mg by mouth daily.    [name] EPINEPHrine 0.3 mg/0.3 mL IJ SOAJ injection Inject 0.3 mg into the muscle as needed for anaphylaxis.    . ferrous sulfate 325 (65 FE) MG tablet Take 325 mg by mouth daily.    [name] gabapentin (NEURONTIN) 600 MG tablet TAKE 4 TABLETS BY MOUTH AT BEDTIME. (Patient taking differently: Take 600-1,800 mg by mouth See admin instructions. Take 600 mg in the morning and 1800 mg at bedtime) 120 tablet 3  . glipiZIDE (GLUCOTROL) 10 MG tablet Take 10 mg by mouth daily.    [name] LEVEMIR 100 UNIT/ML injection INJECT 60 UNITS INTO THE SKIN AT BEDTIME (Patient taking differently: Inject 67 Units into the skin at bedtime. ) 20 mL 2  . omega-3 acid ethyl esters (LOVAZA) 1 g capsule Take 1 g by mouth daily.     [name] oxyCODONE-acetaminophen (PERCOCET) 5-325 MG tablet Take 1 tablet by mouth every 4 (four) hours as  needed for severe pain. 30 tablet 0  . ranolazine (RANEXA) 1000 MG SR tablet TAKE 1 TABLET BY MOUTH DAILY (Patient taking differently: Take 1,000 mg by mouth daily. ) 30 tablet 0  . sodium bicarbonate 650 MG tablet Take 650 mg by mouth 2 (two) times daily.     [name] aspirin EC 81 MG tablet Take 1 tablet (81 mg total) by mouth daily.    . methocarbamol (ROBAXIN) 500 MG tablet Take 1 tablet (500 mg total) by mouth every 6 (six) hours as needed for muscle spasms. (Patient not taking: Reported on 01/10/2019) 60 tablet 0    Results for orders placed or performed during the hospital encounter of 01/10/19 (from the past 48 hour(s))  SARS CORONAVIRUS 2 (TAT 6-24 HRS) Nasopharyngeal Nasopharyngeal Swab     Status: None   Collection Time: 01/10/19  2:22 PM   Specimen: Nasopharyngeal Swab  Result Value Ref Range   SARS Coronavirus 2 NEGATIVE NEGATIVE    Comment: (NOTE) SARS-CoV-2 target nucleic acids are NOT DETECTED. The SARS-CoV-2 RNA is generally detectable in upper and lower respiratory specimens during the acute phase of infection. Negative results do not preclude SARS-CoV-2 infection, do not rule out co-infections with other pathogens, and should not be used as the sole basis for treatment or other patient management decisions. Negative results must be combined with clinical observations, patient history, and epidemiological information. The expected result is Negative. Fact Sheet for Patients: SugarRoll.be Fact Sheet for Healthcare Providers: https://www.woods-mathews.com/ This test is not yet approved or cleared by the [place] FDA and  has been authorized for detection and/or diagnosis of SARS-CoV-2 by FDA under an Emergency Use Authorization (EUA). This EUA will remain  in effect (meaning this test can be used) for the duration of the COVID-19 declaration under Section 56 4(b)(1) of the Act, 21 U.S.C. section 360bbb-3(b)(1), unless the  authorization is terminated or revoked sooner. Performed at [facility], [place] [address], [place], [place] [address]    No results found.  Review of Systems  All other systems reviewed and are negative.   There were no vitals taken for this visit. Physical Exam  Patient is alert, oriented, no adenopathy, well-dressed, normal affect, normal respiratory effort. Left foot nonhealing surgical incision with sutures in place moderate amount of foul-smelling serous drainage he does have a palpable dorsalis pedis pulse but difficulty palpating posterior tibial tendon pulse the wound probes deep to bone Assessment/Plan  Visit Diagnoses: Left foot Gangrene  Plan: The patient was seen today with Dr. Sharol Given.  He discussed with the patient that the best option at this point would be a left below-knee amputation.  He reviewed the procedure as well as surgical outcomes and recovery.  With regards to follow-up the patient has worked with [employer] in the past for a back [place], [place] 01/12/2019, 6:56 AM

## 2019-01-13 LAB — GLUCOSE, CAPILLARY
Glucose-Capillary: 285 mg/dL — ABNORMAL HIGH (ref 70–99)
Glucose-Capillary: 255 mg/dL — ABNORMAL HIGH (ref 70–99)
Glucose-Capillary: 159 mg/dL — ABNORMAL HIGH (ref 70–99)
Glucose-Capillary: 153 mg/dL — ABNORMAL HIGH (ref 70–99)

## 2019-01-13 MED ORDER — INSULIN DETEMIR 100 UNIT/ML ~~LOC~~ SOLN
50.0000 [IU] | Freq: Every day | SUBCUTANEOUS | Status: DC
  Administered 2019-01-13 – 2019-01-16 (×4): 50 [IU] via SUBCUTANEOUS
  Filled 2019-01-13 (×5): qty 0.5

## 2019-01-13 NOTE — Progress Notes (Signed)
Inpatient Diabetes Program Recommendations  AACE/ADA: New Consensus Statement on Inpatient Glycemic Control (2015)  Target Ranges:  Prepandial:   less than 140 mg/dL      Peak postprandial:   less than 180 mg/dL (1-2 hours)      Critically ill patients:  140 - 180 mg/dL   Lab Results  Component Value Date   GLUCAP 285 (H) 01/13/2019   HGBA1C 8.9 (H) 01/12/2019    Review of Glycemic Control  Diabetes history: DM 2 Outpatient Diabetes medications: Jardiance 25 mg Daily, Levemir 67 units qhs, Glipizide 10 mg Daily Current orders for Inpatient glycemic control:  Levemir 34 units Novolog 0-15 units tid Novolog 4 units tid meal coverage Invokana 100 mg Daily Glipizide 10 mg Daily  A1c 8.9% on 12/9  Inpatient Diabetes Program Recommendations:    Consider increasing Levemir to 45 units.  Will see pt tomorrow regarding A1c level.  Spoke with Audrea Muscat, PA. Will increase Levemir to 50 units.  Thanks,  Tama Headings RN, MSN, BC-ADM Inpatient Diabetes Coordinator Team Pager [phone] (8a-5p)

## 2019-01-13 NOTE — Evaluation (Signed)
Occupational Therapy Evaluation Patient Details Name: [name] MRN: [id number] DOB: 1960-05-06 Today's Date: 01/13/2019    History of Present Illness 58 yo admitted for Left BKA due to osteomyelitis and dehiscence of left heel. PMhx: DM, BPH, CKD, HTN, HLD, gastroparesis, CVA, neuropathy   Clinical Impression   Pt with decline in function and safety with ADLs and ADL mobility with impaired balance and endurance. Pt reports that PTA, he lived at home with his wife and was independent with ADLs/selfcare and mobility. Pt currently requires sup with bed mobility mod A with LB ADLs, mod A with toileting and min - min guard A with transfers using RW. Pt would benefit from acute OT services to address impairments to maximize level of function and safety    Follow Up Recommendations  CIR;(HH pending progress)    Equipment Recommendations  Other (comment);3 in 1 bedside commode(reacher)    Recommendations for Other Services Rehab consult     Precautions / Restrictions Precautions Precautions: Fall Restrictions Weight Bearing Restrictions: Yes LLE Weight Bearing: Non weight bearing      Mobility Bed Mobility Overal bed mobility: Independent Bed Mobility: Supine to Sit;Sit to Supine Rolling: Supervision   Supine to sit: Supervision Sit to supine: Supervision   General bed mobility comments: cues for sequence with bed flat and use of rail to roll to right for hip extension. supine to sit with rail  Transfers Overall transfer level: Needs assistance Equipment used: Rolling walker (2 wheeled) Transfers: Sit to/from [facility] Sit to Stand: Min assist Stand pivot transfers: Min guard       General transfer comment: pt able to side step by hopping to [facility] with RW    Balance Overall balance assessment: Needs assistance   Sitting balance-Leahy Scale: Good     Standing balance support: Bilateral upper extremity supported;During functional  activity Standing balance-Leahy Scale: Poor Standing balance comment: bil UE support due to BKA                           ADL either performed or assessed with clinical judgement   ADL Overall ADL's : Needs assistance/impaired Eating/Feeding: Independent;Sitting   Grooming: Wash/dry hands;Wash/dry face;Oral care;Set up;Independent;Sitting;With caregiver independent assisting   Upper Body Bathing: Set up;Sitting;With caregiver independent assisting   Lower Body Bathing: Moderate assistance;Sitting/lateral leans;With caregiver independent assisting   Upper Body Dressing : Set up;Sitting;With caregiver independent assisting   Lower Body Dressing: Moderate assistance;Sitting/lateral leans;With caregiver independent assisting   Toilet Transfer: Minimal assistance;Min guard;RW;Stand-pivot   Toileting- Clothing Manipulation and Hygiene: Moderate assistance;Sit to/from stand;With caregiver independent assisting       Functional mobility during ADLs: Minimal assistance;Min guard;Cueing for safety;Cueing for sequencing;Rolling walker       Vision Baseline Vision/History: Wears glasses Wears Glasses: At all times Patient Visual Report: No change from baseline       Perception     Praxis      Pertinent Vitals/Pain Pain Assessment: 0-10 Pain Score: 4  Pain Location: L LE Pain Descriptors / Indicators: Aching;Sore Pain Intervention(s): Monitored during session;Premedicated before session;Repositioned     Hand Dominance Right   Extremity/Trunk Assessment Upper Extremity Assessment Upper Extremity Assessment: Overall WFL for tasks assessed   Lower Extremity Assessment Lower Extremity Assessment: Defer to PT evaluation LLE Deficits / Details: limited ROM and strength due to pain some noted limb jerking throughout HEP   Cervical / Trunk Assessment Cervical / Trunk Assessment: Normal   Communication Communication  Communication: No difficulties   Cognition  Arousal/Alertness: Awake/alert Behavior During Therapy: WFL for tasks assessed/performed Overall Cognitive Status: Within Functional Limits for tasks assessed                                     General Comments       Exercises    Shoulder Instructions      Home Living Family/patient expects to be discharged to:: Private residence Living Arrangements: Spouse/significant other Available Help at Discharge: Family;Available 24 hours/day Type of Home: House Home Access: Stairs to enter [employer] of Steps: 2 Entrance Stairs-Rails: None Home Layout: One level     Bathroom Shower/Tub: Teacher, early years/pre: Handicapped height     Home Equipment: Crutches          Prior Functioning/Environment Level of Independence: Independent                 OT Problem List: Impaired balance (sitting and/or standing);Pain;Decreased activity tolerance;Decreased knowledge of use of DME or AE      OT Treatment/Interventions: Self-care/ADL training;DME and/or AE instruction;Therapeutic activities;Balance training;Patient/family education    OT Goals(Current goals can be found in the care plan section) Acute Rehab OT Goals 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 OT Goal Formulation: With patient/family Time For Goal Achievement: 01/27/19 Potential to Achieve Goals: Good ADL Goals Pt Will Perform Lower Body Bathing: with min assist;sitting/lateral leans Pt Will Perform Lower Body Dressing: with min assist;sitting/lateral leans Pt Will Transfer to Toilet: with min guard assist;with supervision;ambulating;stand pivot transfer;bedside commode Pt Will Perform Toileting - Clothing Manipulation and hygiene: with min assist;sit to/from stand  OT Frequency: Min 2X/week   Barriers to D/C:    no barriers       Co-evaluation              AM-PAC OT "6 Clicks" Daily Activity     Outcome Measure Help from another person eating meals?:  None Help from another person taking care of personal grooming?: None Help from another person toileting, which includes using toliet, bedpan, or urinal?: A Lot Help from another person bathing (including washing, rinsing, drying)?: A Lot Help from another person to put on and taking off regular upper body clothing?: None Help from another person to put on and taking off regular lower body clothing?: A Lot 6 Click Score: 18   End of Session Equipment Utilized During Treatment: Gait belt;Rolling walker  Activity Tolerance: Patient tolerated treatment well Patient left: in bed;with call bell/phone within reach;with family/visitor present  OT Visit Diagnosis: Unsteadiness on feet (R26.81);Other abnormalities of gait and mobility (R26.89);Pain Pain - Right/Left: Left Pain - part of body: Leg                Time: [id number] OT Time Calculation (min): 32 min Charges:  OT General Charges $OT Visit: 1 Visit OT Evaluation $OT Eval Moderate Complexity: 1 Mod OT Treatments $Self Care/Home Management : 8-22 mins    Emmit [facility] 01/13/2019, 2:04 PM

## 2019-01-13 NOTE — Plan of Care (Signed)

## 2019-01-13 NOTE — Progress Notes (Signed)
Inpatient Rehabilitation Admissions Coordinator  Inpatient rehab consult received. I met with patient at bedside for rehab assessment. He prefers an inpt rehab rather than D/C home with [facility]. I will begin Insurance authorization with [employer] for a possible admit pending insurance approval.  Danne Baxter, RN, MSN Rehab Admissions Coordinator [phone] 01/13/2019 3:44 PM

## 2019-01-13 NOTE — Evaluation (Signed)
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 PMhx: DM, BPH, CKD, HTN, HLD, gastroparesis, CVA, neuropathy  Clinical Impression  Pt very pleasant and reports being bored since TV not working. Pt normally sleeps on his right side and able to roll to right today to perform hip extension. Pt with decreased functional mobility, transfers and activity tolerance who will benefit from acute therapy to maximize mobility, independence and safety. Pt educated for wear and don/doff of limb protector, HEP and limb desensitization. Pt moving well and would be an excellent CIR candidate to achieve mod I level.     Follow Up Recommendations CIR;Home health PT(pending progress)    Equipment Recommendations  Rolling walker with 5" wheels;3in1 (PT);Wheelchair (measurements PT);Wheelchair cushion (measurements PT)    Recommendations for Other Services OT consult;Rehab consult     Precautions / Restrictions Precautions Precautions: Fall      Mobility  Bed Mobility Overal bed mobility: Needs Assistance Bed Mobility: Rolling;Supine to Sit Rolling: Supervision   Supine to sit: Supervision     General bed mobility comments: cues for sequence with bed flat and use of rail to roll to right for hip extension. supine to sit with rail  Transfers Overall transfer level: Needs assistance   Transfers: Sit to/from Stand;Stand Pivot Transfers Sit to Stand: Min assist Stand pivot transfers: Min guard       General transfer comment: pt able to stand from elevated surface with cues for hand placement, sequence and safety x 2 trials. Limb protector falling off on initial stand with pt educated for donning and replaced. Pivot with use of RW and pt able to take short hops to chair but fatigued with limited  hopping.  Ambulation/Gait             General Gait Details: unable this date  Stairs            Wheelchair Mobility    Modified Rankin (Stroke Patients Only)       Balance Overall balance assessment: Needs assistance   Sitting balance-Leahy Scale: Good     Standing balance support: Bilateral upper extremity supported Standing balance-Leahy Scale: Poor Standing balance comment: bil UE support due to BKA                             Pertinent Vitals/Pain      Home Living Family/patient expects to be discharged to:: Private residence Living Arrangements: Spouse/significant other Available Help at Discharge: Family;Available 24 hours/day Type of Home: House Home Access: Stairs to enter Entrance Stairs-Rails: None Entrance Stairs-Number of Steps: 2 Home Layout: One level Home Equipment: Crutches      Prior Function Level of Independence: Independent               Hand Dominance        Extremity/Trunk Assessment   Upper Extremity Assessment Upper Extremity Assessment: Overall WFL for tasks assessed    Lower Extremity Assessment Lower Extremity Assessment: LLE deficits/detail LLE Deficits / Details: limited ROM and strength due to pain some noted limb jerking throughout HEP    Cervical / Trunk Assessment Cervical / Trunk Assessment: Normal  Communication   Communication: No difficulties  Cognition Arousal/Alertness: Awake/alert Behavior During Therapy: WFL for tasks assessed/performed Overall Cognitive Status: Within Functional Limits for tasks assessed  General Comments      Exercises Amputee Exercises Hip Extension: AROM;Sidelying;Left;10 reps Hip ABduction/ADduction: AROM;Left;Supine;10 reps Knee Extension: AROM;Left;Supine;10 reps   Assessment/Plan    PT Assessment Patient needs continued PT services  PT Problem List Decreased strength;Decreased  mobility;Decreased activity tolerance;Decreased balance;Decreased knowledge of use of DME;Pain       PT Treatment Interventions Gait training;Balance training;DME instruction;Therapeutic exercise;Wheelchair mobility training;Functional mobility training;Stair training;Therapeutic activities;Patient/family education    PT Goals (Current goals can be found in the Care Plan section)  Acute Rehab PT Goals 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 PT Goal Formulation: With patient Time For Goal Achievement: 01/27/19 Potential to Achieve Goals: Good    Frequency Min 4X/week   Barriers to discharge        Co-evaluation               AM-PAC PT "6 Clicks" Mobility  Outcome Measure Help needed turning from your back to your side while in a flat bed without using bedrails?: A Little Help needed moving from lying on your back to sitting on the side of a flat bed without using bedrails?: A Little Help needed moving to and from a bed to a chair (including a wheelchair)?: A Little Help needed standing up from a chair using your arms (e.g., wheelchair or bedside chair)?: A Little Help needed to walk in hospital room?: A Lot Help needed climbing 3-5 steps with a railing? : Total 6 Click Score: 15    End of Session Equipment Utilized During Treatment: Gait belt Activity Tolerance: Patient tolerated treatment well Patient left: in chair;with call bell/phone within reach;with chair alarm set Nurse Communication: Mobility status;Precautions PT Visit Diagnosis: Difficulty in walking, not elsewhere classified (R26.2);Other abnormalities of gait and mobility (R26.89)    Time: [id number] PT Time Calculation (min) (ACUTE ONLY): 31 min   Charges:   PT Evaluation $PT Eval Moderate Complexity: 1 Mod PT Treatments $Therapeutic Activity: 8-22 mins        [place]end[place] Wherry P, PT Acute Rehabilitation Services Pager: [phone] Office: [phone]   Sandy Salaam Lachanda Buczek 01/13/2019, 12:04 PM

## 2019-01-13 NOTE — Progress Notes (Signed)
Patient ID: [name], male   DOB: [birth year], 59 y.o.   MRN: [id number] Patient is a 58 year old gentleman who is postoperative day 1 transtibial amputation on the left.  There is no drainage in the wound VAC canister there is one check.  Patient has a limb protector from [employer].  Discharge planning based on therapy recommendations.

## 2019-01-14 LAB — SURGICAL PATHOLOGY

## 2019-01-14 LAB — SARS CORONAVIRUS 2 (TAT 6-24 HRS): SARS Coronavirus 2: NEGATIVE

## 2019-01-14 LAB — GLUCOSE, CAPILLARY
Glucose-Capillary: 92 mg/dL (ref 70–99)
Glucose-Capillary: 177 mg/dL — ABNORMAL HIGH (ref 70–99)
Glucose-Capillary: 124 mg/dL — ABNORMAL HIGH (ref 70–99)
Glucose-Capillary: 178 mg/dL — ABNORMAL HIGH (ref 70–99)

## 2019-01-14 NOTE — Progress Notes (Signed)
S/P BKA. Alert and awake. Pain control fair. 0 CC in Vac VSS. CIR would be best option. Awaiting Insurance approval. May be Monday but if approved today would transfer

## 2019-01-14 NOTE — Progress Notes (Signed)
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 PMhx: DM, BPH, CKD, HTN, HLD, gastroparesis, CVA, neuropathy   OT comments  Pt making steady progress towards OT goals this session. Session focus on functional mobility as precursor to higher level ADLs. Pt complete sit>stand from recliner with RW and min guard assist. Pt unsteady with RW reports fear of RW rolling out in front of him. Pt required [place] for balance initially for sit>stand and assist to steady RW while completing hops towards bathroom. Noted that pt was denied CIR, if pt decides to go home will need HHOT and DME below as recommend by OTR. Will continue to follow acutely per POC and update DC recs as needed.    Follow Up Recommendations  CIR;Other (comment)(HH pending progress)    Equipment Recommendations  Other (comment);3 in 1 bedside commode(reacher)    Recommendations for Other Services      Precautions / Restrictions Precautions Precautions: Fall Restrictions Weight Bearing Restrictions: Yes LLE Weight Bearing: Non weight bearing       Mobility Bed Mobility Overal bed mobility: Needs Assistance Bed Mobility: Supine to Sit     Supine to sit: Supervision     General bed mobility comments: OOB in recliner  Transfers Overall transfer level: Needs assistance Equipment used: Rolling walker (2 wheeled) Transfers: Sit to/from [employer] Sit to Stand: Min guard Stand pivot transfers: Min assist       General transfer comment: good technique, min A for stability with hops towards bathroom. unable to get through threshold    Balance Overall balance assessment: Needs assistance Sitting-balance support: Feet supported Sitting balance-Leahy Scale: Good     Standing balance support: Bilateral upper  extremity supported;During functional activity Standing balance-Leahy Scale: Poor Standing balance comment: bil UE support due to BKA                           ADL either performed or assessed with clinical judgement   ADL Overall ADL's : Needs assistance/impaired                     Lower Body Dressing: Moderate assistance;Sitting/lateral leans;With caregiver independent assisting Lower Body Dressing Details (indicate cue type and reason): reviewed limb guard managment Toilet Transfer: Minimal assistance;Min guard;RW;Ambulation Toilet Transfer Details (indicate cue type and reason): simulated via functional mobility with RW, pt unsteady, reports fear of RW rolling out in front of him.[place] for balance and MINguard for sit>stand from chair       Tub/Shower Transfer Details (indicate cue type and reason): discussed need for tub transfer bench if pt goes home, will need to continue to assess Functional mobility during ADLs: Minimal assistance;Min guard;Cueing for safety;Cueing for sequencing;Rolling walker General ADL Comments: session focus on functional mobility with Min guard- MIN A with RW. pt unsteady on feet needing cues for safety with RW     Vision Baseline Vision/History: Wears glasses Wears Glasses: At all times Patient Visual Report: No change from baseline     Perception     Praxis      Cognition Arousal/Alertness: Awake/alert Behavior During Therapy: WFL for tasks assessed/performed Overall Cognitive Status: Within Functional Limits for tasks assessed  Exercises Exercises: Other exercises;Amputee Amputee Exercises Knee Flexion: AROM;Strengthening;Left;10 reps;Seated Knee Extension: AROM;Strengthening;Left;10 reps;Seated Other Exercises Other Exercises: PT demonstrated and instructed pt in gentle tactile input to distal aspect of residual limb   Shoulder Instructions       General  Comments      Pertinent Vitals/ Pain       Pain Assessment: 0-10 Pain Score: 5  Pain Location: L LE Pain Descriptors / Indicators: Aching;Sore Pain Intervention(s): Limited activity within patient's tolerance;Monitored during session;Repositioned  Home Living                                          Prior Functioning/Environment              Frequency  Min 2X/week        Progress Toward Goals  OT Goals(current goals can now be found in the care plan section)  Progress towards OT goals: Progressing toward goals  Acute Rehab OT Goals 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: None Help from another person to put on and taking off regular lower body clothing?: A Lot 6 Click Score: 18    End of Session Equipment Utilized During Treatment: Gait belt;Rolling walker  OT Visit Diagnosis: Unsteadiness on feet (R26.81);Other abnormalities of gait and mobility (R26.89);Pain Pain - Right/Left: Left Pain - part of body: Leg   Activity Tolerance Patient tolerated treatment well   Patient Left in chair;with call bell/phone within reach;with chair alarm set   Nurse Communication          Time: [id number] OT Time Calculation (min): 19 min  Charges: OT General Charges $OT Visit: 1 Visit OT Treatments $Therapeutic Activity: 8-22 mins  Lanier Clam.,  COTA/L Acute Rehabilitation Services [phone] [place] 01/14/2019, 4:26 PM

## 2019-01-14 NOTE — Progress Notes (Signed)
CSW received message that the patient has been declined for CIR by his insurance company.   Patient will likely need HH. CSW will leave handoff for weekend CSW to follow up.

## 2019-01-14 NOTE — Progress Notes (Signed)
Inpatient Diabetes Program Recommendations  AACE/ADA: New Consensus Statement on Inpatient Glycemic Control (2015)  Target Ranges:  Prepandial:   less than 140 mg/dL      Peak postprandial:   less than 180 mg/dL (1-2 hours)      Critically ill patients:  140 - 180 mg/dL   Lab Results  Component Value Date   GLUCAP 177 (H) 01/14/2019   HGBA1C 8.9 (H) 01/12/2019    Review of Glycemic Control  Diabetes history: DM 2 Outpatient Diabetes medications: Jardiance 25 mg Daily, Levemir 67 units qhs, Glipizide 10 mg Daily Current orders for Inpatient glycemic control:  Levemir 34 units Novolog 0-15 units tid Novolog 4 units tid meal coverage Invokana 100 mg Daily Glipizide 10 mg Daily  A1c 8.9% on 12/9  Glucose trends much better today    Spoke with patient at bedside regarding A1c level, 8.9%. Discussed glucose and A1c goals. Pt said in the mornings his glucose is in the 140's and by the evening he is in the upper 100-200 range. Pt checks his glucose tid. Spoke with patient about lowering his fasting glucose and lowering his trends. Pt reports "oh no, I have been in a diabetic coma before and I feel low when my glucose is at 130."  Discussed importance of glucose control on wound healing. Patient understands and will call his doctor with his glucose trends for possible adjustments.  Thanks,  Tama Headings RN, MSN, BC-ADM Inpatient Diabetes Coordinator Team Pager [phone] (8a-5p)

## 2019-01-14 NOTE — Progress Notes (Signed)
Inpatient Rehabilitation Admissions Coordinator  Insurance has denied approval for an inpt rehab admit. I will inform pt and make SW, Shanita aware. We will sign off at this time.  Danne Baxter, RN, MSN Rehab Admissions Coordinator [phone] 01/14/2019 3:05 PM

## 2019-01-14 NOTE — Progress Notes (Signed)
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 PMhx: DM, BPH, CKD, HTN, HLD, gastroparesis, CVA, neuropathy    PT Comments    Pt seen for mobility progression. He continues to demonstrate instability with transfers but improving in confidence overall. Per CIR AC note from today, pt's insurance has denied approval for CIR; therefore, recommendations have been updated. Pt will require HHPT and below listed DME prior to d/c home. Pt would continue to benefit from skilled physical therapy services at this time while admitted and after d/c to address the below listed limitations in order to improve overall safety and independence with functional mobility.   Follow Up Recommendations  Home health PT;Supervision/Assistance - 24 hour;Other (comment)(was denied for CIR)     Equipment Recommendations  Rolling walker with 5" wheels;3in1 (PT);Wheelchair (measurements PT);Wheelchair cushion (measurements PT)    Recommendations for Other Services       Precautions / Restrictions Precautions Precautions: Fall Restrictions Weight Bearing Restrictions: Yes LLE Weight Bearing: Non weight bearing    Mobility  Bed Mobility Overal bed mobility: Needs Assistance Bed Mobility: Supine to Sit     Supine to sit: Supervision     General bed mobility comments: supervision for safety  Transfers Overall transfer level: Needs assistance Equipment used: Rolling walker (2 wheeled) Transfers: Sit to/from [employer] Transfers Sit to Stand: Min guard Stand pivot transfers: Min assist       General transfer comment: good technique, min A for stability with pivotal hops to recliner chair towards pt's R side  Ambulation/Gait                 Stairs             Wheelchair Mobility     Modified Rankin (Stroke Patients Only)       Balance Overall balance assessment: Needs assistance Sitting-balance support: Feet supported Sitting balance-Leahy Scale: Good     Standing balance support: Bilateral upper extremity supported;During functional activity Standing balance-Leahy Scale: Poor                              Cognition Arousal/Alertness: Awake/alert Behavior During Therapy: WFL for tasks assessed/performed Overall Cognitive Status: Within Functional Limits for tasks assessed                                        Exercises Amputee Exercises Knee Flexion: AROM;Strengthening;Left;10 reps;Seated Knee Extension: AROM;Strengthening;Left;10 reps;Seated Other Exercises Other Exercises: PT demonstrated and instructed pt in gentle tactile input to distal aspect of residual limb    General Comments        Pertinent Vitals/Pain Pain Assessment: 0-10 Pain Score: 7  Pain Location: L LE Pain Descriptors / Indicators: Aching;Sore Pain Intervention(s): Monitored during session;Repositioned    Home Living                      Prior Function            PT Goals (current goals can now be found in the care plan section) Acute Rehab PT Goals PT Goal Formulation: With patient Time For Goal Achievement: 01/27/19 Potential to Achieve Goals: Good Progress towards PT goals: Progressing  toward goals    Frequency    Min 4X/week      PT Plan Current plan remains appropriate    Co-evaluation              AM-PAC PT "6 Clicks" Mobility   Outcome Measure  Help needed turning from your back to your side while in a flat bed without using bedrails?: None Help needed moving from lying on your back to sitting on the side of a flat bed without using bedrails?: None Help needed moving to and from a bed to a chair (including a wheelchair)?: A Little Help needed standing up from a chair using your arms (e.g., wheelchair or  bedside chair)?: A Little Help needed to walk in hospital room?: A Lot Help needed climbing 3-5 steps with a railing? : Total 6 Click Score: 17    End of Session Equipment Utilized During Treatment: Gait belt Activity Tolerance: Patient tolerated treatment well Patient left: in chair;with call bell/phone within reach;with chair alarm set;with family/visitor present Nurse Communication: Mobility status;Precautions PT Visit Diagnosis: Difficulty in walking, not elsewhere classified (R26.2);Other abnormalities of gait and mobility (R26.89)     Time: [id number] PT Time Calculation (min) (ACUTE ONLY): 23 min  Charges:  $Therapeutic Exercise: 8-22 mins $Therapeutic Activity: 8-22 mins                     Anastasio Champion, DPT  Acute Rehabilitation Services Pager [phone] Office [place] 01/14/2019, 4:05 PM

## 2019-01-15 LAB — GLUCOSE, CAPILLARY
Glucose-Capillary: 144 mg/dL — ABNORMAL HIGH (ref 70–99)
Glucose-Capillary: 97 mg/dL (ref 70–99)
Glucose-Capillary: 140 mg/dL — ABNORMAL HIGH (ref 70–99)
Glucose-Capillary: 158 mg/dL — ABNORMAL HIGH (ref 70–99)

## 2019-01-15 NOTE — Progress Notes (Signed)
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 PMhx: DM, BPH, CKD, HTN, HLD, gastroparesis, CVA, neuropathy   OT comments  Pt making steady progress towards OT goals this session. Session focus on functional mobility with RW and toileting tasks. Overall, pt min guard- min A for functional mobility with RW. Pt able to complete bed mobility this session with MIN guard from flat surface. Pt progressed his functional mobility distance this session within his room with RW, able to hop into bathroom to use toilet. Education provided to pt and wife on LB dressing technique with wound vac and use of 3n1 as shower seat. Noted that pt was denied CIR.  Pt is likely to DC  home will need HHOT and DME below as recommend by OTR.Will continue to follow acutely per POC and update DC recs as needed.    Follow Up Recommendations  CIR;Other (comment)(HH pending progress)    Equipment Recommendations  Other (comment);3 in 1 bedside commode    Recommendations for Other Services      Precautions / Restrictions Precautions Precautions: Fall Precaution Comments: wound VAC Restrictions Weight Bearing Restrictions: Yes LLE Weight Bearing: Non weight bearing       Mobility Bed Mobility Overal bed mobility: Needs Assistance Bed Mobility: Rolling;Sidelying to Sit Rolling: Supervision Sidelying to sit: Min guard       General bed mobility comments: MIN guard for safety, good technique noted with HOB flat and no use of rails.  Transfers Overall transfer level: Needs assistance Equipment used: Rolling walker (2 wheeled) Transfers: Sit to/from Stand Sit to Stand: Min guard;From elevated surface         General transfer comment: good technique utilized, min guard to rise from EOB    Balance Overall  balance assessment: Needs assistance Sitting-balance support: Feet supported Sitting balance-Leahy Scale: Good     Standing balance support: Bilateral upper extremity supported;During functional activity Standing balance-Leahy Scale: Poor Standing balance comment: bil UE support due to BKA                           ADL either performed or assessed with clinical judgement   ADL Overall ADL's : Needs assistance/impaired                       Lower Body Dressing Details (indicate cue type and reason): education on dressing with wound vac; visually demoed with pt and wife verbalizing understanding Toilet Transfer: Minimal assistance;Min guard;RW;Ambulation;Regular Toilet;BSC;Grab bars Toilet Transfer Details (indicate cue type and reason): MIN guard- MINA for toilet tranfer via functional mobility, intermittent MINA to navigate uneven surface on threshold. 3n1 over regular toilet, noted use of grab bars throughout transfer. handed off pt to PT once on toilet       Tub/Shower Transfer Details (indicate cue type and reason): education provided on set- up of tub transfer bench at home with pt stating he will most likely do sponge baths for awhile Functional mobility during ADLs: Minimal assistance;Min guard;Cueing for safety;Cueing for sequencing;Rolling walker General ADL Comments: pt progressing well towards OT goals. session focus on bed mobility from flat bed, functional mobility with RW, and toileting tasks     Vision Baseline Vision/History: Wears glasses Wears Glasses: At all times Patient Visual Report: No change  from baseline     Perception     Praxis      Cognition Arousal/Alertness: Awake/alert Behavior During Therapy: WFL for tasks assessed/performed Overall Cognitive Status: Within Functional Limits for tasks assessed                                          Exercises   Shoulder Instructions       General Comments wife present  throughout session. education provided to both on using 3n1 as shower seat in tub shower if needed as well as LB dressing technique with wound vac    Pertinent Vitals/ Pain       Pain Assessment: 0-10 Pain Score: 7  Pain Location: L LE Pain Descriptors / Indicators: Aching;Sore Pain Intervention(s): Limited activity within patient's tolerance;Monitored during session;Repositioned  Home Living                                          Prior Functioning/Environment              Frequency  Min 2X/week        Progress Toward Goals  OT Goals(current goals can now be found in the care plan section)  Progress towards OT goals: Progressing toward goals  Acute Rehab OT Goals 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: None Help from another person to put on and taking off regular lower body clothing?: A Lot 6 Click Score: 18    End of Session Equipment Utilized During Treatment: Gait belt;Rolling walker;Other (comment)(BSC)  OT Visit Diagnosis: Unsteadiness on feet (R26.81);Other abnormalities of gait and mobility (R26.89);Pain Pain - Right/Left: Left Pain - part of body: Leg   Activity Tolerance Patient tolerated treatment well   Patient Left Other (comment)(handed off pt to PT on [facility])   Nurse Communication          [id number] OT Time  Calculation (min): 22 min  Charges: OT General Charges $OT Visit: 1 Visit OT Treatments $Self Care/Home Management : 8-22 mins  Lanier Clam., COTA/L Acute Rehabilitation Services [phone]    [name] 01/15/2019, 1:48 PM

## 2019-01-15 NOTE — Discharge Instructions (Signed)

## 2019-01-15 NOTE — Plan of Care (Addendum)
  RD consulted for nutrition education regarding diabetes.  RD working remotely.  Lab Results  Component Value Date   HGBA1C 8.9 (H) 01/12/2019   Spoke with pt via phone this afternoon. Patient reports usual breakfast of eggs, bacon or sausage, assorted sandwiches with chips for lunch. He stated that his wife prepares dinner and recalls hotdogs, cheeseburgers, spaghetti. Patient reports drinking water, Gatorade, tea, and regular soda.   In discharge summary, RD provided "Heart Healthy Consistent Carbohydrate" handout from the Academy of [employer]. Discussed dietary recall as well as different food groups and their effects on blood sugar, emphasizing carbohydrate-containing foods. Provided list of carbohydrates and recommended serving sizes of common foods.  Discussed importance of controlled and consistent carbohydrate intake throughout the day. Provided examples of ways to balance meals/snacks and encouraged intake of high-fiber, whole grain complex carbohydrates. Teach back method used.  Expect fair compliance.  Body mass index is 31.42 kg/m. Pt meets criteria for obese based on current BMI.  Current diet order is CM, patient is consuming approximately 100% of meals at this time. Labs and medications reviewed. No further nutrition interventions warranted at this time. RD contact information provided. If additional nutrition issues arise, please re-consult RD.  [name], RD, LDN Clinical Nutrition [place] Telephone [phone] After Hours/Weekend Pager: [phone]

## 2019-01-15 NOTE — Progress Notes (Signed)
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 PMhx: DM, BPH, CKD, HTN, HLD, gastroparesis, CVA, neuropathy    PT Comments    Pt continuing to make good progress with functional mobility. He tolerated short distance ambulation within his room today with RW and min guard for safety. Will need stair training prior to d/c home if he is unable to have the ramp built at his house before then. Pt would continue to benefit from skilled physical therapy services at this time while admitted and after d/c to address the below listed limitations in order to improve overall safety and independence with functional mobility.    Follow Up Recommendations  Home health PT;Supervision/Assistance - 24 hour     Equipment Recommendations  Rolling walker with 5" wheels;3in1 (PT);Wheelchair (measurements PT);Wheelchair cushion (measurements PT)    Recommendations for Other Services       Precautions / Restrictions Precautions Precautions: Fall Precaution Comments: wound VAC Restrictions Weight Bearing Restrictions: Yes LLE Weight Bearing: Non weight bearing    Mobility  Bed Mobility               General bed mobility comments: pt OOB with OT present upon arrival  Transfers Overall transfer level: Needs assistance Equipment used: Rolling walker (2 wheeled) Transfers: Sit to/from Stand Sit to Stand: Min guard         General transfer comment: good technique utilized, min guard to rise from [facility] in bathroom  Ambulation/Gait Ambulation/Gait assistance: Counsellor (Feet): 10 Feet(10' x2) Assistive device: Rolling walker (2 wheeled) Gait Pattern/deviations: (hop-to on R LE) Gait velocity: decreased   General Gait Details: pt overall steady with RW with min guard   Stairs             Wheelchair Mobility    Modified Rankin (Stroke Patients Only)       Balance Overall balance assessment: Needs assistance Sitting-balance support: Feet supported Sitting balance-Leahy Scale: Good     Standing balance support: Bilateral upper extremity supported;During functional activity Standing balance-Leahy Scale: Poor Standing balance comment: bil UE support due to BKA                            Cognition Arousal/Alertness: Awake/alert Behavior During Therapy: WFL for tasks assessed/performed Overall Cognitive Status: Within Functional Limits for tasks assessed                                        Exercises Amputee Exercises Quad Sets: AROM;Strengthening;Left;10 reps;Seated Hip ABduction/ADduction: AROM;Strengthening;Left;10 reps;Seated Straight Leg Raises: AROM;Strengthening;Left;10 reps;Seated    General Comments        Pertinent Vitals/Pain Pain Assessment: 0-10 Pain Score: 7  Pain Location: L LE Pain Descriptors / Indicators: Aching;Sore Pain Intervention(s): Monitored during session;Repositioned;Patient requesting pain meds-RN notified    Home Living                      Prior Function            PT Goals (current goals can now be found in the care plan section) Acute Rehab PT Goals PT Goal Formulation: With patient Time For Goal Achievement: 01/27/19 Potential to Achieve  Goals: Good Progress towards PT goals: Progressing toward goals    Frequency    Min 4X/week      PT Plan Current plan remains appropriate    Co-evaluation              AM-PAC PT "6 Clicks" Mobility   Outcome Measure  Help needed turning from your back to your side while in a flat bed without using bedrails?: None Help needed moving from lying on your back to sitting on the side of a flat bed without using bedrails?: None Help needed moving to and from a bed to a chair (including a wheelchair)?: A Little Help needed  standing up from a chair using your arms (e.g., wheelchair or bedside chair)?: A Little Help needed to walk in hospital room?: A Lot Help needed climbing 3-5 steps with a railing? : Total 6 Click Score: 17    End of Session Equipment Utilized During Treatment: Gait belt Activity Tolerance: Patient tolerated treatment well Patient left: in chair;with call bell/phone within reach;with chair alarm set;with family/visitor present Nurse Communication: Mobility status;Precautions PT Visit Diagnosis: Difficulty in walking, not elsewhere classified (R26.2);Other abnormalities of gait and mobility (R26.89)     Time: [id number] PT Time Calculation (min) (ACUTE ONLY): 18 min  Charges:  $Gait Training: 8-22 mins                     Anastasio Champion, DPT  Acute Rehabilitation Services Pager [phone] Office [place] 01/15/2019, 12:39 PM

## 2019-01-15 NOTE — Progress Notes (Signed)
Patient ID: [name], male   DOB: [birth year], 59 y.o.   MRN: [id number] Patient's insurance denied inpatient rehabilitation.  Patient is making good progress with therapy and anticipate discharge to home with home health physical therapy.  Most likely discharge to home on Monday no drainage of the wound VAC canister, [employer] for prosthetic fitting.

## 2019-01-15 NOTE — Plan of Care (Signed)

## 2019-01-16 LAB — GLUCOSE, CAPILLARY
Glucose-Capillary: 133 mg/dL — ABNORMAL HIGH (ref 70–99)
Glucose-Capillary: 174 mg/dL — ABNORMAL HIGH (ref 70–99)
Glucose-Capillary: 244 mg/dL — ABNORMAL HIGH (ref 70–99)
Glucose-Capillary: 147 mg/dL — ABNORMAL HIGH (ref 70–99)

## 2019-01-16 NOTE — Progress Notes (Signed)
Patient ID: [name], male   DOB: 03-29-1960, 58 y.o.   MRN: [id number]  Patient states he is doing well with no complaints.  Prosthetic fitting in place. No drainage in canister. Up with PT. Possible discharge to home tomorrow.

## 2019-01-16 NOTE — Progress Notes (Signed)
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 PMhx: DM, BPH, CKD, HTN, HLD, gastroparesis, CVA, neuropathy    PT Comments    Pt performed gt training and functional mobility during session.  LIMited gt to focus on progression to stair training.  Pt anxious but completed x 3 stairs with RW and backward technique as he does not have railings to enter his home.  Pt educated to have two people to assist with one in the front and one in the back for safety.  Pt has difficulty transitioning RW to next stair.      Follow Up Recommendations  Home health PT;Supervision/Assistance - 24 hour     Equipment Recommendations  Rolling walker with 5" wheels;3in1 (PT);Wheelchair (measurements PT);Wheelchair cushion (measurements PT)    Recommendations for Other Services OT consult;Rehab consult     Precautions / Restrictions Precautions Precautions: Fall Precaution Comments: wound VAC Restrictions Weight Bearing Restrictions: Yes LLE Weight Bearing: Non weight bearing    Mobility  Bed Mobility Overal bed mobility: Needs Assistance   Rolling: Modified independent (Device/Increase time)   Supine to sit: Modified independent (Device/Increase time)     General bed mobility comments: Pt able to come to sitting edge of bed unassisted.  Transfers   Equipment used: Rolling walker (2 wheeled) Transfers: Sit to/from Stand Sit to Stand: Supervision         General transfer comment: Cues for hand placement to and from seated surface.  Ambulation/Gait Ambulation/Gait assistance: Min guard Gait Distance (Feet): 10 Feet(x2) Assistive device: Rolling walker (2 wheeled) Gait Pattern/deviations: Step-to pattern(hop to R LE) Gait velocity: decreased   General Gait Details: pt overall steady with RW with  min guard   Stairs Stairs: Yes Stairs assistance: Min assist Stair Management: Backwards;With walker Number of Stairs: 3(4 in) General stair comments: Pt required cues for sequencing.  Utilized RW to negotiate x 3 stair backwards.   Wheelchair Mobility    Modified Rankin (Stroke Patients Only)       Balance Overall balance assessment: Needs assistance   Sitting balance-Leahy Scale: Good       Standing balance-Leahy Scale: Poor                              Cognition Arousal/Alertness: Awake/alert Behavior During Therapy: WFL for tasks assessed/performed Overall Cognitive Status: Within Functional Limits for tasks assessed                                        Exercises      General Comments        Pertinent Vitals/Pain Pain Assessment: 0-10 Pain Score: 6  Pain Location: L LE Pain Descriptors / Indicators: Aching;Sore Pain Intervention(s): Monitored during session;Repositioned    Home Living                      Prior Function            PT Goals (current goals can now be found in the care plan section) Acute Rehab PT Goals 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 PT Goal Formulation: With patient Potential to Achieve Goals: Good Additional Goals Additional Goal #1:  Pt will propel WC 100' with supervision Progress towards PT goals: Progressing toward goals    Frequency    Min 4X/week      PT Plan Current plan remains appropriate    Co-evaluation              AM-PAC PT "6 Clicks" Mobility   Outcome Measure  Help needed turning from your back to your side while in a flat bed without using bedrails?: None Help needed moving from lying on your back to sitting on the side of a flat bed without using bedrails?: None Help needed moving to and from a bed to a chair (including a wheelchair)?: None Help needed standing up from a chair using your arms (e.g., wheelchair or bedside chair)?: A Little Help  needed to walk in hospital room?: A Little Help needed climbing 3-5 steps with a railing? : A Little 6 Click Score: 21    End of Session Equipment Utilized During Treatment: Gait belt Activity Tolerance: Patient tolerated treatment well Patient left: in chair;with call bell/phone within reach;with chair alarm set;with family/visitor present Nurse Communication: Mobility status;Precautions PT Visit Diagnosis: Difficulty in walking, not elsewhere classified (R26.2);Other abnormalities of gait and mobility (R26.89)     Time: [id number] PT Time Calculation (min) (ACUTE ONLY): 19 min  Charges:  $Gait Training: 8-22 mins                     Erasmo Leventhal , PTA Acute Rehabilitation Services Pager [phone] Office [phone]     Kylie Gros Eli Hose 01/16/2019, 3:50 PM

## 2019-01-17 LAB — GLUCOSE, CAPILLARY
Glucose-Capillary: 137 mg/dL — ABNORMAL HIGH (ref 70–99)
Glucose-Capillary: 157 mg/dL — ABNORMAL HIGH (ref 70–99)

## 2019-01-17 MED ORDER — OXYCODONE-ACETAMINOPHEN 5-325 MG PO TABS: 1.0000 | ORAL_TABLET | ORAL | 0 refills | Status: DC | PRN

## 2019-01-17 NOTE — Care Management Note (Cosign Needed)
Case Management Note  Patient Details  Name: [name] MRN: [id number] Date of Birth: [birth year]     Durable Medical Equipment  (From admission, onward)         Start     Ordered   01/17/19 [phone]  For home use only DME standard manual wheelchair with seat cushion  Once    Comments: Patient suffers from left Below Knee Amputationwhich impairs their ability to perform daily activities like ambulating in the home.  A walking  aid will not resolve issue with performing activities of daily living. A wheelchair will allow patient to safely perform daily activities. Patient can safely propel the wheelchair in the home or has a caregiver who can provide assistance. Length of need 6 months Accessories: elevating leg rests (ELRs), wheel locks, extensions and anti-tippers.   01/17/19 0948   01/17/19 0946  For home use only DME Walker  Once    Comments: 5 in wheels  Question:  Patient needs a walker to treat with the following condition  Answer:  S/P BKA (below knee amputation) ([place])   01/17/19 0946   01/17/19 0945  For home use only DME 3 n 1  Once    Comments: 3 in 1 wheelchair   01/17/19 0944   01/17/19 0945  For home use only DME 3 n 1  Once     01/17/19 0946   01/17/19 0944  For home use only DME Walker rolling  Once    Question:  Patient needs a walker to treat with the following condition  Answer:  Below-knee amputation ([place])   01/17/19 [place] Edmonia Gonser, RN 01/17/2019, 11:37 AM

## 2019-01-17 NOTE — Progress Notes (Signed)
Awake alert. 0cc in vac.   Anticipate DC today if arrangements made for home Health and PT.

## 2019-01-17 NOTE — Progress Notes (Addendum)
Pt is ambulating to the bathroom with the walker, standby assist. Wound vac connected to [place]. Demonstrated to the pt, discharge instructions was given to the pt.  While in the Gym with PT, pt lost balance and assisted to sit down the step by PT. Pt denies pain to his buttocks. V/S was checked by PT. A little bit dizzy but felt better after sitting up in the chair.  Dr Sharol Given notified thru [place]. Will proceed with discharge. Pt is discharged via [facility]. Per SW, pt's walker will be delivered today in his house.

## 2019-01-17 NOTE — Progress Notes (Signed)
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 PMhx: DM, BPH, CKD, HTN, HLD, gastroparesis, CVA, neuropathy   OT comments  Pt progressing well toward stated goals, session focused on discharge and home safety. Pt received on toilet, had completed hygiene with mod I assist. Pt able to transfer off of toilet with RW and min guard assist. He did have one minor LOB to the L due to hand placement, but self corrected without need of cues. Pt then able to complete hopping functional mobility with RW back to recliner. Reviewed all DME and BADL implications with safety in the home. He denied shower transfer practice this date, stating he would rather sponge bathe. HHOT remains appropriate d/c rec. Will continue to follow.   Follow Up Recommendations  Home health OT;Supervision/Assistance - 24 hour    Equipment Recommendations  3 in 1 bedside commode;Wheelchair (measurements OT);Wheelchair cushion (measurements OT)    Recommendations for Other Services      Precautions / Restrictions Precautions Precautions: Fall Precaution Comments: wound VAC Restrictions Weight Bearing Restrictions: Yes LLE Weight Bearing: Non weight bearing       Mobility Bed Mobility               General bed mobility comments: treatment completed all out of chair  Transfers Overall transfer level: Needs assistance Equipment used: Rolling walker (2 wheeled) Transfers: Sit to/from Stand Sit to Stand: Min guard         General transfer comment: min guard for safety,  pt with minor LOB when tranferring off of toilet to L due to hand placement. Pt self corrected without cues and completed successfully    Balance Overall balance assessment: Needs assistance Sitting-balance support: Feet supported Sitting  balance-Leahy Scale: Good     Standing balance support: Bilateral upper extremity supported;During functional activity Standing balance-Leahy Scale: Poor Standing balance comment: reliant on external support mostly on L side                           ADL either performed or assessed with clinical judgement   ADL Overall ADL's : Needs assistance/impaired                         Toilet Transfer: Garment/textile technologist Details (indicate cue type and reason): min guard with RW to hop from toilet, able to transfer off with min guard assist Toileting- Clothing Manipulation and Hygiene: Modified independent;Supervision/safety;Sitting/lateral lean Toileting - Clothing Manipulation Details (indicate cue type and reason): able to complete peri care without OT present   Tub/Shower Transfer Details (indicate cue type and reason): educated on 3:1 in tub shower, pt denied practice stating he plans to sponge bath with wife for now Functional mobility during ADLs: Min guard;Rolling walker General ADL Comments: pt making good progress, able to do toilet transfer and functional mobility at min guard level. Education given on safe discharge     Vision Baseline Vision/History: Wears glasses Wears Glasses: At all times Patient Visual Report: No change from baseline     Perception     Praxis      Cognition Arousal/Alertness: Awake/alert Behavior During Therapy: WFL for tasks assessed/performed Overall Cognitive Status: Within Functional Limits for tasks assessed  Exercises     Shoulder Instructions       General Comments      Pertinent Vitals/ Pain       Pain Assessment: Faces Faces Pain Scale: Hurts little more Pain Location: L LE Pain Descriptors / Indicators: Aching;Sore Pain Intervention(s): Monitored during session;Repositioned;Patient requesting pain meds-RN  notified  Home Living                                          Prior Functioning/Environment              Frequency  Min 2X/week        Progress Toward Goals  OT Goals(current goals can now be found in the care plan section)  Progress towards OT goals: Progressing toward goals  Acute Rehab OT Goals 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: None Help from another person to put on and taking off regular lower body clothing?: A Lot 6 Click Score: 18    End of Session Equipment Utilized During Treatment: Gait belt;Rolling walker  OT Visit Diagnosis: Unsteadiness on feet (R26.81);Other abnormalities of gait and mobility (R26.89);Pain Pain - Right/Left: Left Pain - part of body: Leg   Activity Tolerance Patient tolerated treatment well   Patient Left in chair;with call bell/phone within reach   Nurse Communication Mobility status;Patient requests pain meds        Time: [id number] OT Time Calculation (min): 25 min  Charges: OT General Charges $OT Visit: 1 Visit OT Treatments $Self Care/Home Management : 23-37 mins  Zenovia Jarred, MSOT, OTR/L Behavioral Health OT/ Acute Relief OT [facility] Office: [phone]  Zenovia Jarred 01/17/2019, 9:37 AM

## 2019-01-17 NOTE — TOC Transition Note (Signed)
Transition of Care [facility]) - CM/SW Discharge Note   Patient Details  Name: IVICA RISENHOOVER MRN: [id number] Date of Birth: 1960/10/29  Transition of [facility]) CM/SW Contact:  Atilano Median, LCSW Phone Number: 01/17/2019, 3:35 PM   Clinical Narrative:    Discharged home with home health services. DME will be delivered to patient's home today. Home health has been arranged. No other needs at this time. Case closed to this CSW.    Final next level of care: [place]     Patient Goals and CMS Choice Patient states their goals for this hospitalization and ongoing recovery are:: return home CMS Medicare.gov Compare Post Acute Care list provided to:: Patient Choice offered to / list presented to : Patient  Discharge Placement                Patient to be transferred to facility by: [place] Name of family member notified: patient Patient and family notified of of transfer: 01/17/19  Discharge Plan and Services                DME Arranged: 3-N-1, Walker rolling, Wheelchair manual DME Agency: [employer] Date DME Agency Contacted: 01/17/19 Time DME Agency Contacted: 30 Representative spoke with at DME Agency: [facility] HH Arranged: PT, OT          Social Determinants of Health ([place]) Interventions     Readmission Risk Interventions No flowsheet data found.

## 2019-01-17 NOTE — Progress Notes (Addendum)
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 PMhx: DM, BPH, CKD, HTN, HLD, gastroparesis, CVA, neuropathy    PT Comments    Pt admitted for above. Pt mod I for bed mobility with increased time and min guard for sit<>stand from bed and sit<>stand to chair. Pt stated he felt relatively comfortable with stairs but that it is "scary" going up backwards, so attempted to work on stair navigation. After turning to back up to the step, pt had LOB and sat on the step. Pt was wearing gait belt and required min/mod assist to safely sit. Pt stated that his knee buckled suddenly which caused him to lose his balance. He required mod assist +2 in order to stand back up. He was able to hop to the chair min guard and noted dizziness and began to dry heave. Pt taken back to room in recliner and checked BP (134/78). Pt stated he felt better and was given a cool washcloth to apply to his forehead. He stated that this happens often at home. During LOB, pt did not appear to bump his residual limb. Checked his wound/incision site upon return to room and site was intact with wound vac and dressing in place with no compromise. Limb guard was in place throughout entire session. Pt stated that an ambulance transport has been arranged to get him into his home. Agree with this decision, since getting him into his home via the stairs with walker is a safety concern at this time. Given that his leg gave way, discussed that pt may need to use wheelchair to get to and from the bathroom at home and to avoid long-distance ambulation. Pt states that his church will be building him a ramp. Handouts on how to build a ramp and wheelchair stair navigation distributed to pt. Continue to recommend [facility] PT. Pt would benefit from continued skilled PT  intervention in order to address impairments.   Follow Up Recommendations  Home health PT;Supervision/Assistance - 24 hour     Equipment Recommendations  Rolling walker with 5" wheels;3in1 (PT);Wheelchair (measurements PT);Wheelchair cushion (measurements PT)    Recommendations for Other Services       Precautions / Restrictions Precautions Precautions: Fall Precaution Comments: wound VAC Restrictions Weight Bearing Restrictions: Yes LLE Weight Bearing: Non weight bearing    Mobility  Bed Mobility Overal bed mobility: Needs Assistance Bed Mobility: Supine to Sit     Supine to sit: Modified independent (Device/Increase time)     General bed mobility comments: pt mod I with increased time to sit EOB; no physical assistance required  Transfers Overall transfer level: Needs assistance Equipment used: Rolling walker (2 wheeled) Transfers: Sit to/from Stand Sit to Stand: Min guard;Mod assist;+2 physical assistance         General transfer comment: min guard for safety; no LOB or unsteadiness and pt did not require cues for hand placement; sit to stand from bottom of step required mod A +2 for physical assistance to return to standing  Ambulation/Gait Ambulation/Gait assistance: Min guard Gait Distance (Feet): 10 Feet Assistive device: Rolling walker (2 wheeled) Gait Pattern/deviations: Step-to pattern Gait velocity: decreased   General Gait Details: pt hopped 2 ft to chair without LOB or unsteadiness and min guard for safety; in ortho gym, pt hopped 4 ft to stairs from recliner and  had LOB after turning to get ready to go up stairs backward with walker; min guard for safety back to chair once standing   Stairs          General stair comments: Pt declined practicing stairs following LOB. Afterwards, pt stated to PT that he was getting an ambulance transport. Therefore, SPT assumes he wanted to practice to see if he would do better on the stairs but cannot complete  today due to safety issues.   Wheelchair Mobility    Modified Rankin (Stroke Patients Only)       Balance Overall balance assessment: Needs assistance Sitting-balance support: Feet supported Sitting balance-Leahy Scale: Good Sitting balance - Comments: maintained sitting balance for don/doff of gait belt   Standing balance support: Bilateral upper extremity supported;During functional activity Standing balance-Leahy Scale: Poor Standing balance comment: reliant on external support mostly on L side                            Cognition Arousal/Alertness: Awake/alert Behavior During Therapy: WFL for tasks assessed/performed Overall Cognitive Status: Within Functional Limits for tasks assessed                                        Exercises      General Comments General comments (skin integrity, edema, etc.): once back in room at end of session, checked his incision site and dressing had no compromise      Pertinent Vitals/Pain Pain Assessment: Faces Faces Pain Scale: Hurts a little bit Pain Location: L LE Pain Descriptors / Indicators: Aching;Sore Pain Intervention(s): Limited activity within patient's tolerance;Monitored during session;Repositioned    Home Living                      Prior Function            PT Goals (current goals can now be found in the care plan section) Acute Rehab PT Goals 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 PT Goal Formulation: With patient Time For Goal Achievement: 01/27/19 Potential to Achieve Goals: Good Progress towards PT goals: Progressing toward goals    Frequency    Min 4X/week      PT Plan Current plan remains appropriate    Co-evaluation              AM-PAC PT "6 Clicks" Mobility   Outcome Measure  Help needed turning from your back to your side while in a flat bed without using bedrails?: None Help needed moving from lying on your back to sitting on the side of a  flat bed without using bedrails?: None Help needed moving to and from a bed to a chair (including a wheelchair)?: None Help needed standing up from a chair using your arms (e.g., wheelchair or bedside chair)?: A Little Help needed to walk in hospital room?: A Little Help needed climbing 3-5 steps with a railing? : A Little 6 Click Score: 21    End of Session Equipment Utilized During Treatment: Gait belt Activity Tolerance: Patient limited by fatigue;Patient limited by pain Patient left: in chair;with call bell/phone within reach Nurse Communication: Mobility status PT Visit Diagnosis: Difficulty in walking, not elsewhere classified (R26.2);Other abnormalities of gait and mobility (R26.89)     Time: [id number] PT Time Calculation (min) (ACUTE ONLY): 34 min  Charges:  $Gait Training: 8-22  mins $Therapeutic Activity: 8-22 mins                     Christel Mormon, SPT   Silvana Newness 01/17/2019, 12:08 PM

## 2019-01-17 NOTE — Care Management Important Message (Signed)
Important Message  Patient Details  Name: [name] MRN: [id number] Date of Birth: 1960/08/04   Medicare Important Message Given:  Yes     Memory Argue 01/17/2019, 3:12 PM

## 2019-01-17 NOTE — Discharge Summary (Signed)
Discharge Diagnoses:  Active Problems:   Subacute osteomyelitis, left ankle and foot ([place])   Gangrene of left foot ([place])   Surgeries: Procedure(s): LEFT BELOW KNEE AMPUTATION on 01/12/2019    Consultants:   Discharged Condition: Improved  Hospital Course: [name] is an 58 y.o. male who was admitted 01/12/2019 with a chief complaint of Left Foot Gangrene, with a final diagnosis of Wound Dehiscence Left Foot.  Patient was brought to the operating room on 01/12/2019 and underwent Procedure(s): LEFT BELOW KNEE AMPUTATION.    Patient was given perioperative antibiotics:  Anti-infectives (From admission, onward)   Start     Dose/Rate Route Frequency Ordered Stop   01/12/19 1700  ceFAZolin (ANCEF) IVPB 2g/100 mL premix     2 g 200 mL/hr over 30 Minutes Intravenous Every 6 hours 01/12/19 1247 01/13/19 0543   01/12/19 0600  ceFAZolin (ANCEF) 3 g in dextrose 5 % 50 mL IVPB     3 g 100 mL/hr over 30 Minutes Intravenous On call to O.R. 01/11/19 0810 01/12/19 1045    .  Patient was given sequential compression devices, early ambulation, and aspirin for DVT prophylaxis.  Recent vital signs:  Patient Vitals for the past 24 hrs:  BP Temp Temp src Pulse Resp SpO2  01/17/19 0443 133/77 (!) 97.4 F (36.3 C) Oral (!) 57 18 99 %  01/16/19 2036 116/72 97.7 F (36.5 C) Oral (!) 57 18 97 %  01/16/19 1441 119/69 98.1 F (36.7 C) Oral 65 -- 99 %  01/16/19 0848 129/88 98.2 F (36.8 C) Oral (!) 59 -- 98 %  .  Recent laboratory studies: No results found.  Discharge Medications:   Allergies as of 01/17/2019      Reactions   Yellow Jacket Venom [bee Venom] Swelling   Reglan [metoclopramide] Itching, Rash      Medication List    STOP taking these medications   doxycycline 100 MG tablet Commonly known as: VIBRA-TABS     TAKE these medications   aspirin EC 81 MG tablet Take 1 tablet (81 mg total) by mouth daily.   atorvastatin 40 MG tablet Commonly known as: LIPITOR Take 1 tablet  (40 mg total) by mouth daily.   EPINEPHrine 0.3 mg/0.3 mL Soaj injection Commonly known as: EPI-PEN Inject 0.3 mg into the muscle as needed for anaphylaxis.   ferrous sulfate 325 (65 FE) MG tablet Take 325 mg by mouth daily.   gabapentin 600 MG tablet Commonly known as: NEURONTIN TAKE 4 TABLETS BY MOUTH AT BEDTIME. What changed:   how much to take  when to take this  additional instructions   glipiZIDE 10 MG tablet Commonly known as: GLUCOTROL Take 10 mg by mouth daily.   Jardiance 25 MG Tabs tablet Generic drug: empagliflozin Take 25 mg by mouth daily.   Levemir 100 UNIT/ML injection Generic drug: insulin detemir INJECT 60 UNITS INTO THE SKIN AT BEDTIME What changed: See the new instructions.   methocarbamol 500 MG tablet Commonly known as: Robaxin Take 1 tablet (500 mg total) by mouth every 6 (six) hours as needed for muscle spasms.   omega-3 acid ethyl esters 1 g capsule Commonly known as: LOVAZA Take 1 g by mouth daily.   oxyCODONE-acetaminophen 5-325 MG tablet Commonly known as: Percocet Take 1 tablet by mouth every 4 (four) hours as needed for severe pain.   ranolazine 1000 MG SR tablet Commonly known as: RANEXA TAKE 1 TABLET BY MOUTH DAILY   sodium bicarbonate 650 MG tablet Take  650 mg by mouth 2 (two) times daily.       Diagnostic Studies: No results found.  Patient benefited maximally from their hospital stay and there were no complications.     Disposition: Discharge disposition: 01-Home or Self Care      Discharge Instructions    Call MD / Call 911   Complete by: As directed    If you experience chest pain or shortness of breath, CALL 911 and be transported to the hospital emergency room.  If you develope a fever above 101 F, pus (white drainage) or increased drainage or redness at the wound, or calf pain, call your surgeon's office.   Constipation Prevention   Complete by: As directed    Drink plenty of fluids.  Prune juice may be  helpful.  You may use a stool softener, such as Colace (over the counter) 100 mg twice a day.  Use MiraLax (over the counter) for constipation as needed.   Diet - low sodium heart healthy   Complete by: As directed    Discharge instructions   Complete by: As directed    Keep dressing clean and dry.   Increase activity slowly as tolerated   Complete by: As directed    Neg Press Wound Therapy / Incisional   Complete by: As directed    Show patient how to attach prevena vac   Neg Press Wound Therapy / Incisional   Complete by: As directed    Show patient how to attach prevena pump     Follow-up Information    Newt Minion, MD In 1 week.   Specialty: Orthopedic Surgery Contact information: [address] [place] [address] [phone]            Signed: Bevely Palmer Loveta Dellis 01/17/2019, 7:45 AM

## 2019-01-18 ENCOUNTER — Telehealth: Payer: Self-pay | Admitting: Orthopedic Surgery

## 2019-01-18 DIAGNOSIS — N183 Chronic kidney disease, stage 3 unspecified: Secondary | ICD-10-CM | POA: Diagnosis not present

## 2019-01-18 DIAGNOSIS — M1990 Unspecified osteoarthritis, unspecified site: Secondary | ICD-10-CM | POA: Diagnosis not present

## 2019-01-18 DIAGNOSIS — M797 Fibromyalgia: Secondary | ICD-10-CM | POA: Diagnosis not present

## 2019-01-18 DIAGNOSIS — Z4781 Encounter for orthopedic aftercare following surgical amputation: Secondary | ICD-10-CM | POA: Diagnosis not present

## 2019-01-18 DIAGNOSIS — E1122 Type 2 diabetes mellitus with diabetic chronic kidney disease: Secondary | ICD-10-CM | POA: Diagnosis not present

## 2019-01-18 DIAGNOSIS — E1143 Type 2 diabetes mellitus with diabetic autonomic (poly)neuropathy: Secondary | ICD-10-CM | POA: Diagnosis not present

## 2019-01-18 DIAGNOSIS — E1151 Type 2 diabetes mellitus with diabetic peripheral angiopathy without gangrene: Secondary | ICD-10-CM | POA: Diagnosis not present

## 2019-01-18 DIAGNOSIS — I129 Hypertensive chronic kidney disease with stage 1 through stage 4 chronic kidney disease, or unspecified chronic kidney disease: Secondary | ICD-10-CM | POA: Diagnosis not present

## 2019-01-18 DIAGNOSIS — Z89512 Acquired absence of left leg below knee: Secondary | ICD-10-CM | POA: Diagnosis not present

## 2019-01-18 DIAGNOSIS — I209 Angina pectoris, unspecified: Secondary | ICD-10-CM | POA: Diagnosis not present

## 2019-01-18 NOTE — Telephone Encounter (Signed)
Debbie with [facility] called. She would like verbal orders. 2 wk 4wks and she would like to know what to do with wound vac. Her call back number is [phone]

## 2019-01-19 DIAGNOSIS — E1122 Type 2 diabetes mellitus with diabetic chronic kidney disease: Secondary | ICD-10-CM | POA: Diagnosis not present

## 2019-01-19 DIAGNOSIS — M1990 Unspecified osteoarthritis, unspecified site: Secondary | ICD-10-CM | POA: Diagnosis not present

## 2019-01-19 DIAGNOSIS — I209 Angina pectoris, unspecified: Secondary | ICD-10-CM | POA: Diagnosis not present

## 2019-01-19 DIAGNOSIS — M797 Fibromyalgia: Secondary | ICD-10-CM | POA: Diagnosis not present

## 2019-01-19 DIAGNOSIS — Z89512 Acquired absence of left leg below knee: Secondary | ICD-10-CM | POA: Diagnosis not present

## 2019-01-19 DIAGNOSIS — E1143 Type 2 diabetes mellitus with diabetic autonomic (poly)neuropathy: Secondary | ICD-10-CM | POA: Diagnosis not present

## 2019-01-19 DIAGNOSIS — I129 Hypertensive chronic kidney disease with stage 1 through stage 4 chronic kidney disease, or unspecified chronic kidney disease: Secondary | ICD-10-CM | POA: Diagnosis not present

## 2019-01-19 DIAGNOSIS — N183 Chronic kidney disease, stage 3 unspecified: Secondary | ICD-10-CM | POA: Diagnosis not present

## 2019-01-19 DIAGNOSIS — E1151 Type 2 diabetes mellitus with diabetic peripheral angiopathy without gangrene: Secondary | ICD-10-CM | POA: Diagnosis not present

## 2019-01-19 DIAGNOSIS — Z4781 Encounter for orthopedic aftercare following surgical amputation: Secondary | ICD-10-CM | POA: Diagnosis not present

## 2019-01-19 NOTE — Telephone Encounter (Signed)
Jackelyn Poling was called and given verbal okay for orders needed and also informed to remove wound vac from patient on next scheduled visit since patient rescheduled appt for 01/24/2019. Debbie understood and will remove it.

## 2019-01-20 ENCOUNTER — Inpatient Hospital Stay: Payer: Medicare HMO | Admitting: Physician Assistant

## 2019-01-21 ENCOUNTER — Telehealth: Payer: Self-pay

## 2019-01-21 DIAGNOSIS — Z4781 Encounter for orthopedic aftercare following surgical amputation: Secondary | ICD-10-CM | POA: Diagnosis not present

## 2019-01-21 DIAGNOSIS — N183 Chronic kidney disease, stage 3 unspecified: Secondary | ICD-10-CM | POA: Diagnosis not present

## 2019-01-21 DIAGNOSIS — E1143 Type 2 diabetes mellitus with diabetic autonomic (poly)neuropathy: Secondary | ICD-10-CM | POA: Diagnosis not present

## 2019-01-21 DIAGNOSIS — I129 Hypertensive chronic kidney disease with stage 1 through stage 4 chronic kidney disease, or unspecified chronic kidney disease: Secondary | ICD-10-CM | POA: Diagnosis not present

## 2019-01-21 DIAGNOSIS — I209 Angina pectoris, unspecified: Secondary | ICD-10-CM | POA: Diagnosis not present

## 2019-01-21 DIAGNOSIS — E1151 Type 2 diabetes mellitus with diabetic peripheral angiopathy without gangrene: Secondary | ICD-10-CM | POA: Diagnosis not present

## 2019-01-21 DIAGNOSIS — M797 Fibromyalgia: Secondary | ICD-10-CM | POA: Diagnosis not present

## 2019-01-21 DIAGNOSIS — M1990 Unspecified osteoarthritis, unspecified site: Secondary | ICD-10-CM | POA: Diagnosis not present

## 2019-01-21 DIAGNOSIS — Z89512 Acquired absence of left leg below knee: Secondary | ICD-10-CM | POA: Diagnosis not present

## 2019-01-21 DIAGNOSIS — E1122 Type 2 diabetes mellitus with diabetic chronic kidney disease: Secondary | ICD-10-CM | POA: Diagnosis not present

## 2019-01-21 NOTE — Telephone Encounter (Signed)
[name] with [facility] called concerning wound vac for patient.  Cleora Fleet of previous message in patient's chart concerning wound vac removal.  Otila Kluver voiced that she understands.  Cb# [phone].

## 2019-01-24 ENCOUNTER — Encounter: Payer: Self-pay | Admitting: Physician Assistant

## 2019-01-24 ENCOUNTER — Other Ambulatory Visit: Payer: Self-pay

## 2019-01-24 ENCOUNTER — Ambulatory Visit (INDEPENDENT_AMBULATORY_CARE_PROVIDER_SITE_OTHER): Payer: Medicare HMO | Admitting: Physician Assistant

## 2019-01-24 ENCOUNTER — Ambulatory Visit (INDEPENDENT_AMBULATORY_CARE_PROVIDER_SITE_OTHER): Payer: Medicare HMO

## 2019-01-24 VITALS — Ht 77.0 in | Wt 265.0 lb

## 2019-01-24 DIAGNOSIS — M86172 Other acute osteomyelitis, left ankle and foot: Secondary | ICD-10-CM

## 2019-01-24 DIAGNOSIS — E1142 Type 2 diabetes mellitus with diabetic polyneuropathy: Secondary | ICD-10-CM

## 2019-01-24 MED ORDER — METHOCARBAMOL 500 MG PO TABS: 500.0000 mg | ORAL_TABLET | Freq: Four times a day (QID) | ORAL | 0 refills | Status: DC | PRN

## 2019-01-24 NOTE — Progress Notes (Signed)
Office Visit Note   Patient: [name]           Date of Birth: Jul 01, 1960           MRN: [id number] Visit Date: 01/24/2019              Requested by: Arsenio Katz, NP [place],  [place] [address] PCP: Arsenio Katz, NP  Chief Complaint  Patient presents with  . Left Leg - Routine Post Op    01/12/2019 LBKA      HPI: 11 days s/p Left BKA doing well. Asking for an xray as he fell right before he left the hospital. Not using pain medication  Assessment & Plan: Visit Diagnoses:  1. Acute osteomyelitis of left calcaneus (HCC)   2. Diabetic polyneuropathy associated with type 2 diabetes mellitus ([place])     Plan: Follow up 10 days. I told him that once the staples are removed he should apply the shrinker next to the skin  Follow-Up Instructions: No follow-ups on file.   Ortho Exam  Patient is alert, oriented, no adenopathy, well-dressed, normal affect, normal respiratory effort. Left amputation stump Healing incision. No cellulitis, swelling well controlled healthy wound edges.   Imaging: No results found. No images are attached to the encounter.  Labs: Lab Results  Component Value Date   HGBA1C 8.9 (H) 01/12/2019   HGBA1C 8.4 (H) 03/15/2017   HGBA1C 8.9 (H) 12/24/2016   REPTSTATUS 06/07/2013 FINAL 06/02/2013   REPTSTATUS 06/04/2013 FINAL 06/02/2013   GRAMSTAIN  06/02/2013    RARE WBC PRESENT,BOTH PMN AND MONONUCLEAR NO SQUAMOUS EPITHELIAL CELLS SEEN NO ORGANISMS SEEN Performed at [place]  06/02/2013    RARE WBC PRESENT,BOTH PMN AND MONONUCLEAR NO SQUAMOUS EPITHELIAL CELLS SEEN NO ORGANISMS SEEN Performed at [facility]   CULT  06/02/2013    NO ANAEROBES ISOLATED Performed at [place]  06/02/2013    NO GROWTH 2 DAYS Performed at [facility]     Lab Results  Component Value Date   ALBUMIN 4.0 04/06/2018   ALBUMIN 4.3 10/30/2017   ALBUMIN 4.3 03/13/2017    No results found for:  MG No results found for: VD25OH  No results found for: PREALBUMIN CBC EXTENDED Latest Ref Rng & Units 01/12/2019 12/22/2018 10/04/2018  WBC 4.0 - 10.5 K/uL 6.6 6.7 -  RBC 4.22 - 5.81 MIL/uL 4.24 4.80 -  HGB 13.0 - 17.0 g/dL 10.8(L) 12.7(L) 13.6  HCT 39.0 - 52.0 % 34.7(L) 42.3 40.0  PLT 150 - 400 K/uL 349 236 -  NEUTROABS 1.7 - 7.7 K/uL - - -  LYMPHSABS 0.7 - 4.0 K/uL - - -     Body mass index is 31.42 kg/m.  Orders:  Orders Placed This Encounter  Procedures  . XR Tibia/Fibula Left   No orders of the defined types were placed in this encounter.    Procedures: No procedures performed  Clinical Data: No additional findings.  ROS:  All other systems negative, except as noted in the HPI. Review of Systems  Objective: Vital Signs: Ht 6\' 5"  (1.956 m)   Wt 265 lb (120.2 kg)   BMI 31.42 kg/m   Specialty Comments:  No specialty comments available.  PMFS History: Patient Active Problem List   Diagnosis Date Noted  . Gangrene of left foot ([place]) 01/12/2019  . Subacute osteomyelitis, left ankle and foot ([place])   . Acute osteomyelitis of left calcaneus (HCC)   .  Ulcer of left heel and midfoot with necrosis of bone ([place])   . Gastroesophageal reflux disease without esophagitis 11/09/2017  . Abdominal pain, chronic, epigastric 11/09/2017  . Numbness   . Syncope 03/13/2017  . H/O cervical spine surgery 03/13/2017  . Cervical spondylosis with radiculopathy 12/29/2016  . CVA (cerebral vascular accident) ([place]) 08/30/2014  . Diabetes with skin complication ([place]) [id number]  . Neuropathy 08/30/2014  . AKI (acute kidney injury) ([place]) 08/30/2014  . Bilateral occipital neuralgia 10/30/2013  . Lumbar stenosis without neurogenic claudication 01/12/2013  . Type 2 diabetes mellitus with vascular disease ([place])   . Hypertension   . Hyperlipidemia   . Gastroparesis   . Pulmonary embolus ([place]) 2012   . Obesity   . GERD 01/14/2010  . Chest pain 10/05/2003   Past Medical History:    Diagnosis Date  . Allergic rhinitis, cause unspecified   . Anginal pain ([place])   . Arthritis   . BPH (benign prostatic hyperplasia)   . Cervicalgia   . Chest pain Sept. 2005   multiple caths  /  cath 06/15/2010..normal coronaries,  EF 60%   (false postive nuclear in the past)  . CKD (chronic kidney disease), stage III   . Complication of anesthesia    Hard time waking up patient stated low blood pressures around 2017  . Dyslipidemia    mixed  . Esophageal reflux   . Fibromyalgia   . Gastroparesis   . Headache(784.0)   . History of kidney stones   . Hyperlipidemia   . Hypertension   . Hypoplasia of one kidney   . IDDM (insulin dependent diabetes mellitus)   . Lumbago   . Neuropathy associated with endocrine disorder ([place])   . Overweight(278.02)   . Peripheral vascular disease ([place])   . Pulmonary embolus (HCC)    Hx of small left lower lobe  pulmonary embolus  . Pulmonary embolus ([place]) 2010ish  . Stroke [facility])    mini stroke aug 2016  . Type II or unspecified type diabetes mellitus with neurological manifestations, not stated as uncontrolled(250.60)   . Urticaria, unspecified   . Wears glasses     Family History  Problem Relation Age of Onset  . Heart attack Father 23  . Hyperlipidemia Father   . Hypertension Father   . Heart attack Mother 93  . Diabetes Mother   . Hypertension Mother   . Hyperlipidemia Mother   . Healthy Brother   . Seizures Brother   . Migraines Neg Hx     Past Surgical History:  Procedure Laterality Date  . ACHILLES TENDON REPAIR Left 2013  . AMPUTATION Left 06/02/2013   Procedure: AMPUTATION 1ST TOE LEFT FOOT;  Surgeon: Marcheta Grammes, DPM;  Location: [facility];  Service: Orthopedics;  Laterality: Left;  . AMPUTATION Left 07/21/2013   Procedure: PARTIAL AMPUTATION 2ND TOE LEFT FOOT;  Surgeon: Marcheta Grammes, DPM;  Location: [facility];  Service: Podiatry;  Laterality: Left;  . AMPUTATION Left 01/12/2019   Procedure: LEFT BELOW KNEE  AMPUTATION;  Surgeon: Newt Minion, MD;  Location: [facility];  Service: Orthopedics;  Laterality: Left;  . ANTERIOR CERVICAL DECOMP/DISCECTOMY FUSION N/A 12/29/2016   Procedure: Cervical five-six Cervical six-seven Anterior cervical discectomy with fusion and plate fixation;  Surgeon: Ditty, Kevan Ny, MD;  Location: [facility];  Service: Neurosurgery;  Laterality: N/A;  . APPENDECTOMY    . BACK SURGERY  1999   x 6 some fusions  . BIOPSY  12/18/2017   Procedure: BIOPSY;  Surgeon: Rogene Houston, MD;  [facility];  Service: Endoscopy;;  gastric and esophagus  . colonscopy    . ESOPHAGOGASTRODUODENOSCOPY (EGD) WITH PROPOFOL N/A 12/18/2017   Procedure: ESOPHAGOGASTRODUODENOSCOPY (EGD) WITH PROPOFOL;  Surgeon: Rogene Houston, MD;  [facility];  Service: Endoscopy;  Laterality: N/A;  9:25  . FOOT ARTHRODESIS Right 01/11/2014   Procedure: ARTHRODESIS INTERPHALANGEAL JOINT HALLUX RIGHT FOOT;  Surgeon: Marcheta Grammes, DPM;  Location: [facility];  Service: Podiatry;  Laterality: Right;  . HARDWARE REMOVAL Right 01/17/2015   Procedure: HARDWARE REMOVAL;  Surgeon: Caprice Beaver, DPM;  Location: [facility];  Service: Podiatry;  Laterality: Right;  . I & D EXTREMITY Left 12/22/2018   Procedure: LEFT PARTIAL CALCANEAL EXCISION;  Surgeon: Newt Minion, MD;  Location: [facility];  Service: Orthopedics;  Laterality: Left;  . KNEE ARTHROSCOPY Right 10/2015  . LUMBAR LAMINECTOMY/DECOMPRESSION MICRODISCECTOMY Right 01/12/2013   Procedure: Right Lumbar Three-Four Laminotomy/Foraminotomy;  Surgeon: Floyce Stakes, MD;  [facility];  Service: Neurosurgery;  Laterality: Right;  Right Lumbar Three-Four Laminotomy/Foraminotomy  . NISSEN FUNDOPLICATION    . SHOULDER ARTHROSCOPY W/ ROTATOR CUFF REPAIR Right   . SHOULDER ARTHROSCOPY WITH DISTAL CLAVICLE RESECTION Right 10/04/2018   Procedure: RIGHT SHOULDER ARTHROSCOPY WITH DISTAL CLAVICLE RESECTION, EXTENSIVE DEBRIDEMENT, SUBACROMIAL  PARTIAL ACROMIOPLASTY,;  Surgeon: Earlie Server, MD;  Location: [facility];  Service: Orthopedics;  Laterality: Right;  PRE/POST OP SCALENE   Social History   Occupational History  . Occupation: Retired- Research officer, trade union  Tobacco Use  . Smoking status: Former Smoker    Packs/day: 3.00    Years: 5.00    Pack years: 15.00    Types: Cigarettes    Start date: 06/20/1977    Quit date: 07/20/1982    Years since quitting: 36.5  . Smokeless tobacco: Never Used  . Tobacco comment: quit 1980's  Substance and Sexual Activity  . Alcohol use: No    Alcohol/week: 0.0 standard drinks  . Drug use: No  . Sexual activity: Yes    Birth control/protection: None

## 2019-01-25 ENCOUNTER — Telehealth: Payer: Self-pay | Admitting: Orthopedic Surgery

## 2019-01-25 DIAGNOSIS — E1151 Type 2 diabetes mellitus with diabetic peripheral angiopathy without gangrene: Secondary | ICD-10-CM | POA: Diagnosis not present

## 2019-01-25 DIAGNOSIS — E1143 Type 2 diabetes mellitus with diabetic autonomic (poly)neuropathy: Secondary | ICD-10-CM | POA: Diagnosis not present

## 2019-01-25 DIAGNOSIS — I129 Hypertensive chronic kidney disease with stage 1 through stage 4 chronic kidney disease, or unspecified chronic kidney disease: Secondary | ICD-10-CM | POA: Diagnosis not present

## 2019-01-25 DIAGNOSIS — Z4781 Encounter for orthopedic aftercare following surgical amputation: Secondary | ICD-10-CM | POA: Diagnosis not present

## 2019-01-25 DIAGNOSIS — I209 Angina pectoris, unspecified: Secondary | ICD-10-CM | POA: Diagnosis not present

## 2019-01-25 DIAGNOSIS — M1990 Unspecified osteoarthritis, unspecified site: Secondary | ICD-10-CM | POA: Diagnosis not present

## 2019-01-25 DIAGNOSIS — M797 Fibromyalgia: Secondary | ICD-10-CM | POA: Diagnosis not present

## 2019-01-25 DIAGNOSIS — N183 Chronic kidney disease, stage 3 unspecified: Secondary | ICD-10-CM | POA: Diagnosis not present

## 2019-01-25 DIAGNOSIS — Z89512 Acquired absence of left leg below knee: Secondary | ICD-10-CM | POA: Diagnosis not present

## 2019-01-25 DIAGNOSIS — E1122 Type 2 diabetes mellitus with diabetic chronic kidney disease: Secondary | ICD-10-CM | POA: Diagnosis not present

## 2019-01-25 NOTE — Telephone Encounter (Signed)
Pt is s/p a BKA 01/12/19 and HHN states that we advised the pt to leave a dry dressing on the limb for 2 weeks without removing it. Advised that this was not correct. To apply dry dressing change daily and HHN states that she instructed the pt on how to clean the area and advised to change daily and will call with any other questions.

## 2019-01-25 NOTE — Telephone Encounter (Signed)
[facility] Coo[facility] called in requesting a call back with correct incision care for the pt.   [phone]

## 2019-01-26 DIAGNOSIS — I129 Hypertensive chronic kidney disease with stage 1 through stage 4 chronic kidney disease, or unspecified chronic kidney disease: Secondary | ICD-10-CM | POA: Diagnosis not present

## 2019-01-26 DIAGNOSIS — N183 Chronic kidney disease, stage 3 unspecified: Secondary | ICD-10-CM | POA: Diagnosis not present

## 2019-01-26 DIAGNOSIS — E1151 Type 2 diabetes mellitus with diabetic peripheral angiopathy without gangrene: Secondary | ICD-10-CM | POA: Diagnosis not present

## 2019-01-26 DIAGNOSIS — E1122 Type 2 diabetes mellitus with diabetic chronic kidney disease: Secondary | ICD-10-CM | POA: Diagnosis not present

## 2019-01-26 DIAGNOSIS — Z89512 Acquired absence of left leg below knee: Secondary | ICD-10-CM | POA: Diagnosis not present

## 2019-01-26 DIAGNOSIS — I209 Angina pectoris, unspecified: Secondary | ICD-10-CM | POA: Diagnosis not present

## 2019-01-26 DIAGNOSIS — E1143 Type 2 diabetes mellitus with diabetic autonomic (poly)neuropathy: Secondary | ICD-10-CM | POA: Diagnosis not present

## 2019-01-26 DIAGNOSIS — M797 Fibromyalgia: Secondary | ICD-10-CM | POA: Diagnosis not present

## 2019-01-26 DIAGNOSIS — Z4781 Encounter for orthopedic aftercare following surgical amputation: Secondary | ICD-10-CM | POA: Diagnosis not present

## 2019-01-26 DIAGNOSIS — M1990 Unspecified osteoarthritis, unspecified site: Secondary | ICD-10-CM | POA: Diagnosis not present

## 2019-01-27 DIAGNOSIS — E1122 Type 2 diabetes mellitus with diabetic chronic kidney disease: Secondary | ICD-10-CM | POA: Diagnosis not present

## 2019-01-27 DIAGNOSIS — Z89512 Acquired absence of left leg below knee: Secondary | ICD-10-CM | POA: Diagnosis not present

## 2019-01-27 DIAGNOSIS — E1151 Type 2 diabetes mellitus with diabetic peripheral angiopathy without gangrene: Secondary | ICD-10-CM | POA: Diagnosis not present

## 2019-01-27 DIAGNOSIS — Z4781 Encounter for orthopedic aftercare following surgical amputation: Secondary | ICD-10-CM | POA: Diagnosis not present

## 2019-01-27 DIAGNOSIS — M797 Fibromyalgia: Secondary | ICD-10-CM | POA: Diagnosis not present

## 2019-01-27 DIAGNOSIS — I209 Angina pectoris, unspecified: Secondary | ICD-10-CM | POA: Diagnosis not present

## 2019-01-27 DIAGNOSIS — I129 Hypertensive chronic kidney disease with stage 1 through stage 4 chronic kidney disease, or unspecified chronic kidney disease: Secondary | ICD-10-CM | POA: Diagnosis not present

## 2019-01-27 DIAGNOSIS — N183 Chronic kidney disease, stage 3 unspecified: Secondary | ICD-10-CM | POA: Diagnosis not present

## 2019-01-27 DIAGNOSIS — E1143 Type 2 diabetes mellitus with diabetic autonomic (poly)neuropathy: Secondary | ICD-10-CM | POA: Diagnosis not present

## 2019-01-27 DIAGNOSIS — M1990 Unspecified osteoarthritis, unspecified site: Secondary | ICD-10-CM | POA: Diagnosis not present

## 2019-01-31 DIAGNOSIS — I129 Hypertensive chronic kidney disease with stage 1 through stage 4 chronic kidney disease, or unspecified chronic kidney disease: Secondary | ICD-10-CM | POA: Diagnosis not present

## 2019-01-31 DIAGNOSIS — Z89512 Acquired absence of left leg below knee: Secondary | ICD-10-CM | POA: Diagnosis not present

## 2019-01-31 DIAGNOSIS — E1151 Type 2 diabetes mellitus with diabetic peripheral angiopathy without gangrene: Secondary | ICD-10-CM | POA: Diagnosis not present

## 2019-01-31 DIAGNOSIS — N183 Chronic kidney disease, stage 3 unspecified: Secondary | ICD-10-CM | POA: Diagnosis not present

## 2019-01-31 DIAGNOSIS — E1122 Type 2 diabetes mellitus with diabetic chronic kidney disease: Secondary | ICD-10-CM | POA: Diagnosis not present

## 2019-01-31 DIAGNOSIS — Z4781 Encounter for orthopedic aftercare following surgical amputation: Secondary | ICD-10-CM | POA: Diagnosis not present

## 2019-01-31 DIAGNOSIS — E1143 Type 2 diabetes mellitus with diabetic autonomic (poly)neuropathy: Secondary | ICD-10-CM | POA: Diagnosis not present

## 2019-01-31 DIAGNOSIS — M1990 Unspecified osteoarthritis, unspecified site: Secondary | ICD-10-CM | POA: Diagnosis not present

## 2019-01-31 DIAGNOSIS — M797 Fibromyalgia: Secondary | ICD-10-CM | POA: Diagnosis not present

## 2019-01-31 DIAGNOSIS — I209 Angina pectoris, unspecified: Secondary | ICD-10-CM | POA: Diagnosis not present

## 2019-02-02 DIAGNOSIS — E1151 Type 2 diabetes mellitus with diabetic peripheral angiopathy without gangrene: Secondary | ICD-10-CM | POA: Diagnosis not present

## 2019-02-02 DIAGNOSIS — E1122 Type 2 diabetes mellitus with diabetic chronic kidney disease: Secondary | ICD-10-CM | POA: Diagnosis not present

## 2019-02-02 DIAGNOSIS — I129 Hypertensive chronic kidney disease with stage 1 through stage 4 chronic kidney disease, or unspecified chronic kidney disease: Secondary | ICD-10-CM | POA: Diagnosis not present

## 2019-02-02 DIAGNOSIS — M797 Fibromyalgia: Secondary | ICD-10-CM | POA: Diagnosis not present

## 2019-02-02 DIAGNOSIS — Z4781 Encounter for orthopedic aftercare following surgical amputation: Secondary | ICD-10-CM | POA: Diagnosis not present

## 2019-02-02 DIAGNOSIS — E1143 Type 2 diabetes mellitus with diabetic autonomic (poly)neuropathy: Secondary | ICD-10-CM | POA: Diagnosis not present

## 2019-02-02 DIAGNOSIS — N183 Chronic kidney disease, stage 3 unspecified: Secondary | ICD-10-CM | POA: Diagnosis not present

## 2019-02-02 DIAGNOSIS — M1990 Unspecified osteoarthritis, unspecified site: Secondary | ICD-10-CM | POA: Diagnosis not present

## 2019-02-02 DIAGNOSIS — Z89512 Acquired absence of left leg below knee: Secondary | ICD-10-CM | POA: Diagnosis not present

## 2019-02-02 DIAGNOSIS — I209 Angina pectoris, unspecified: Secondary | ICD-10-CM | POA: Diagnosis not present

## 2019-02-07 ENCOUNTER — Ambulatory Visit (INDEPENDENT_AMBULATORY_CARE_PROVIDER_SITE_OTHER): Payer: Medicare HMO | Admitting: Orthopedic Surgery

## 2019-02-07 ENCOUNTER — Other Ambulatory Visit: Payer: Self-pay

## 2019-02-07 ENCOUNTER — Encounter: Payer: Self-pay | Admitting: Orthopedic Surgery

## 2019-02-07 VITALS — Ht 77.0 in | Wt 265.0 lb

## 2019-02-07 DIAGNOSIS — L97424 Non-pressure chronic ulcer of left heel and midfoot with necrosis of bone: Secondary | ICD-10-CM

## 2019-02-07 NOTE — Progress Notes (Signed)
Office Visit Note   Patient: [name]           Date of Birth: 1960-07-23           MRN: [id number] Visit Date: 02/07/2019              Requested by: Arsenio Katz, NP [place],  [place] [address] PCP: Arsenio Katz, NP  Chief Complaint  Patient presents with  . Left Leg - Routine Post Op    01/12/19 left BKA       HPI: This is a pleasant gentleman who is now 3-1/2 weeks status post left below-knee amputation he is overall doing well he is going to obtain a smaller shrinker today  Assessment & Plan: Visit Diagnoses: No diagnosis found.  Plan: He will follow up in 1 week.  At that time I am hopeful that we can remove his surgical staples  Follow-Up Instructions: No follow-ups on file.   Ortho Exam  Patient is alert, oriented, no adenopathy, well-dressed, normal affect, normal respiratory effort. Overall he is healing well he does have healthy wound edges he does have a small amount of dehiscence and drainage in the very central portion of the wound the dehiscence is fairly small less than half a centimeter there is no foul odor no cellulitis  Imaging: No results found.   Labs: Lab Results  Component Value Date   HGBA1C 8.9 (H) 01/12/2019   HGBA1C 8.4 (H) 03/15/2017   HGBA1C 8.9 (H) 12/24/2016   REPTSTATUS 06/07/2013 FINAL 06/02/2013   REPTSTATUS 06/04/2013 FINAL 06/02/2013   GRAMSTAIN  06/02/2013    RARE WBC PRESENT,BOTH PMN AND MONONUCLEAR NO SQUAMOUS EPITHELIAL CELLS SEEN NO ORGANISMS SEEN Performed at [facility]  06/02/2013    RARE WBC PRESENT,BOTH PMN AND MONONUCLEAR NO SQUAMOUS EPITHELIAL CELLS SEEN NO ORGANISMS SEEN Performed at [facility]   CULT  06/02/2013    NO ANAEROBES ISOLATED Performed at [facility]  06/02/2013    NO GROWTH 2 DAYS Performed at [facility]     Lab Results  Component Value Date   ALBUMIN 4.0 04/06/2018   ALBUMIN 4.3 10/30/2017   ALBUMIN 4.3 03/13/2017      No results found for: MG No results found for: VD25OH  No results found for: PREALBUMIN CBC EXTENDED Latest Ref Rng & Units 01/12/2019 12/22/2018 10/04/2018  WBC 4.0 - 10.5 K/uL 6.6 6.7 -  RBC 4.22 - 5.81 MIL/uL 4.24 4.80 -  HGB 13.0 - 17.0 g/dL 10.8(L) 12.7(L) 13.6  HCT 39.0 - 52.0 % 34.7(L) 42.3 40.0  PLT 150 - 400 K/uL 349 236 -  NEUTROABS 1.7 - 7.7 K/uL - - -  LYMPHSABS 0.7 - 4.0 K/uL - - -     Body mass index is 31.42 kg/m.  Orders:  No orders of the defined types were placed in this encounter.  No orders of the defined types were placed in this encounter.    Procedures: No procedures performed  Clinical Data: No additional findings.  ROS:  All other systems negative, except as noted in the HPI. Review of Systems  Objective: Vital Signs: Ht 6\' 5"  (1.956 m)   Wt 265 lb (120.2 kg)   BMI 31.42 kg/m   Specialty Comments:  No specialty comments available.  PMFS History: Patient Active Problem List   Diagnosis Date Noted  . Gangrene of left foot ([facility]) 01/12/2019  . Subacute osteomyelitis, left ankle and foot (  [place])   . Acute osteomyelitis of left calcaneus ([place])   . Ulcer of left heel and midfoot with necrosis of bone ([place])   . Gastroesophageal reflux disease without esophagitis 11/09/2017  . Abdominal pain, chronic, epigastric 11/09/2017  . Numbness   . Syncope 03/13/2017  . H/O cervical spine surgery 03/13/2017  . Cervical spondylosis with radiculopathy 12/29/2016  . CVA (cerebral vascular accident) ([place]) 08/30/2014  . Diabetes with skin complication ([place]) 123XX123  . Neuropathy 08/30/2014  . AKI (acute kidney injury) ([place]) 08/30/2014  . Bilateral occipital neuralgia 10/30/2013  . Lumbar stenosis without neurogenic claudication 01/12/2013  . Type 2 diabetes mellitus with vascular disease ([place])   . Hypertension   . Hyperlipidemia   . Gastroparesis   . Pulmonary embolus ([place]) 2012   . Obesity   . GERD 01/14/2010  . Chest pain 10/05/2003    Past Medical History:  Diagnosis Date  . Allergic rhinitis, cause unspecified   . Anginal pain ([place])   . Arthritis   . BPH (benign prostatic hyperplasia)   . Cervicalgia   . Chest pain Sept. 2005   multiple caths  /  cath 06/15/2010..normal coronaries,  EF 60%   (false postive nuclear in the past)  . CKD (chronic kidney disease), stage III   . Complication of anesthesia    Hard time waking up patient stated low blood pressures around 2017  . Dyslipidemia    mixed  . Esophageal reflux   . Fibromyalgia   . Gastroparesis   . Headache(784.0)   . History of kidney stones   . Hyperlipidemia   . Hypertension   . Hypoplasia of one kidney   . IDDM (insulin dependent diabetes mellitus)   . Lumbago   . Neuropathy associated with endocrine disorder ([place])   . Overweight(278.02)   . Peripheral vascular disease ([place])   . Pulmonary embolus (H[facility])    Hx of small left lower lobe  pulmonary embolus  . Pulmonary embolus ([place]) 2010ish  . Stroke [facility])    mini stroke aug 2016  . Type II or unspecified type diabetes mellitus with neurological manifestations, not stated as uncontrolled(250.60)   . Urticaria, unspecified   . Wears glasses     Family History  Problem Relation Age of Onset  . Heart attack Father 36  . Hyperlipidemia Father   . Hypertension Father   . Heart attack Mother 16  . Diabetes Mother   . Hypertension Mother   . Hyperlipidemia Mother   . Healthy Brother   . Seizures Brother   . Migraines Neg Hx     Past Surgical History:  Procedure Laterality Date  . ACHILLES TENDON REPAIR Left 2013  . AMPUTATION Left 06/02/2013   Procedure: AMPUTATION 1ST TOE LEFT FOOT;  Surgeon: Marcheta Grammes, DPM;  [facility];  Service: Orthopedics;  Laterality: Left;  . AMPUTATION Left 07/21/2013   Procedure: PARTIAL AMPUTATION 2ND TOE LEFT FOOT;  Surgeon: Marcheta Grammes, DPM;  [facility];  Service: Podiatry;  Laterality: Left;  . AMPUTATION Left 01/12/2019    Procedure: LEFT BELOW KNEE AMPUTATION;  Surgeon: Newt Minion, MD;  Location: [facility];  Service: Orthopedics;  Laterality: Left;  . ANTERIOR CERVICAL DECOMP/DISCECTOMY FUSION N/A 12/29/2016   Procedure: Cervical five-six Cervical six-seven Anterior cervical discectomy with fusion and plate fixation;  Surgeon: Ditty, Kevan Ny, MD;  Location: [facility];  Service: Neurosurgery;  Laterality: N/A;  . APPENDECTOMY    . [place]   x  6 some fusions  . BIOPSY  12/18/2017   Procedure: BIOPSY;  Surgeon: Rogene Houston, MD;  [facility];  Service: Endoscopy;;  gastric and esophagus  . colonscopy    . ESOPHAGOGASTRODUODENOSCOPY (EGD) WITH PROPOFOL N/A 12/18/2017   Procedure: ESOPHAGOGASTRODUODENOSCOPY (EGD) WITH PROPOFOL;  Surgeon: Rogene Houston, MD;  [facility];  Service: Endoscopy;  Laterality: N/A;  9:25  . FOOT ARTHRODESIS Right 01/11/2014   Procedure: ARTHRODESIS INTERPHALANGEAL JOINT HALLUX RIGHT FOOT;  Surgeon: Marcheta Grammes, DPM;  [facility];  Service: Podiatry;  Laterality: Right;  . HARDWARE REMOVAL Right 01/17/2015   Procedure: HARDWARE REMOVAL;  Surgeon: Caprice Beaver, DPM;  Location[facility];  Service: Podiatry;  Laterality: Right;  . I & D EXTREMITY Left 12/22/2018   Procedure: LEFT PARTIAL CALCANEAL EXCISION;  Surgeon: Newt Minion, MD;  Location: [place] [facility];  Service: Orthopedics;  Laterality: Left;  . KNEE ARTHROSCOPY Right 10/2015  . LUMBAR LAMINECTOMY/DECOMPRESSION MICRODISCECTOMY Right 01/12/2013   Procedure: Right Lumbar Three-Four Laminotomy/Foraminotomy;  Surgeon: Floyce Stakes, MD;  [facility];  Service: Neurosurgery;  Laterality: Right;  Right Lumbar Three-Four Laminotomy/Foraminotomy  . NISSEN FUNDOPLICATION    . SHOULDER ARTHROSCOPY W/ ROTATOR CUFF REPAIR Right   . SHOULDER ARTHROSCOPY WITH DISTAL CLAVICLE RESECTION Right 10/04/2018   Procedure: RIGHT SHOULDER ARTHROSCOPY WITH DISTAL CLAVICLE RESECTION,  EXTENSIVE DEBRIDEMENT, SUBACROMIAL PARTIAL ACROMIOPLASTY,;  Surgeon: Earlie Server, MD;  Location: [facility];  Service: Orthopedics;  Laterality: Right;  PRE/POST OP SCALENE   Social History   Occupational History  . Occupation: Retired- Research officer, trade union  Tobacco Use  . Smoking status: Former Smoker    Packs/day: 3.00    Years: 5.00    Pack years: 15.00    Types: Cigarettes    Start date: 06/20/1977    Quit date: 07/20/1982    Years since quitting: 36.5  . Smokeless tobacco: Never Used  . Tobacco comment: quit 1980's  Substance and Sexual Activity  . Alcohol use: No    Alcohol/week: 0.0 standard drinks  . Drug use: No  . Sexual activity: Yes    Birth control/protection: None

## 2019-02-08 ENCOUNTER — Other Ambulatory Visit: Payer: Self-pay | Admitting: Cardiovascular Disease

## 2019-02-08 DIAGNOSIS — I209 Angina pectoris, unspecified: Secondary | ICD-10-CM | POA: Diagnosis not present

## 2019-02-08 DIAGNOSIS — Z89512 Acquired absence of left leg below knee: Secondary | ICD-10-CM | POA: Diagnosis not present

## 2019-02-08 DIAGNOSIS — E1122 Type 2 diabetes mellitus with diabetic chronic kidney disease: Secondary | ICD-10-CM | POA: Diagnosis not present

## 2019-02-08 DIAGNOSIS — Z4781 Encounter for orthopedic aftercare following surgical amputation: Secondary | ICD-10-CM | POA: Diagnosis not present

## 2019-02-08 DIAGNOSIS — I129 Hypertensive chronic kidney disease with stage 1 through stage 4 chronic kidney disease, or unspecified chronic kidney disease: Secondary | ICD-10-CM | POA: Diagnosis not present

## 2019-02-08 DIAGNOSIS — N183 Chronic kidney disease, stage 3 unspecified: Secondary | ICD-10-CM | POA: Diagnosis not present

## 2019-02-08 DIAGNOSIS — M1990 Unspecified osteoarthritis, unspecified site: Secondary | ICD-10-CM | POA: Diagnosis not present

## 2019-02-08 DIAGNOSIS — M797 Fibromyalgia: Secondary | ICD-10-CM | POA: Diagnosis not present

## 2019-02-08 DIAGNOSIS — E1151 Type 2 diabetes mellitus with diabetic peripheral angiopathy without gangrene: Secondary | ICD-10-CM | POA: Diagnosis not present

## 2019-02-08 DIAGNOSIS — E1143 Type 2 diabetes mellitus with diabetic autonomic (poly)neuropathy: Secondary | ICD-10-CM | POA: Diagnosis not present

## 2019-02-10 DIAGNOSIS — N183 Chronic kidney disease, stage 3 unspecified: Secondary | ICD-10-CM | POA: Diagnosis not present

## 2019-02-10 DIAGNOSIS — I129 Hypertensive chronic kidney disease with stage 1 through stage 4 chronic kidney disease, or unspecified chronic kidney disease: Secondary | ICD-10-CM | POA: Diagnosis not present

## 2019-02-10 DIAGNOSIS — I209 Angina pectoris, unspecified: Secondary | ICD-10-CM | POA: Diagnosis not present

## 2019-02-10 DIAGNOSIS — E1151 Type 2 diabetes mellitus with diabetic peripheral angiopathy without gangrene: Secondary | ICD-10-CM | POA: Diagnosis not present

## 2019-02-10 DIAGNOSIS — M797 Fibromyalgia: Secondary | ICD-10-CM | POA: Diagnosis not present

## 2019-02-10 DIAGNOSIS — E1122 Type 2 diabetes mellitus with diabetic chronic kidney disease: Secondary | ICD-10-CM | POA: Diagnosis not present

## 2019-02-10 DIAGNOSIS — E1143 Type 2 diabetes mellitus with diabetic autonomic (poly)neuropathy: Secondary | ICD-10-CM | POA: Diagnosis not present

## 2019-02-10 DIAGNOSIS — Z89512 Acquired absence of left leg below knee: Secondary | ICD-10-CM | POA: Diagnosis not present

## 2019-02-10 DIAGNOSIS — M1990 Unspecified osteoarthritis, unspecified site: Secondary | ICD-10-CM | POA: Diagnosis not present

## 2019-02-10 DIAGNOSIS — Z4781 Encounter for orthopedic aftercare following surgical amputation: Secondary | ICD-10-CM | POA: Diagnosis not present

## 2019-02-11 ENCOUNTER — Telehealth: Payer: Self-pay | Admitting: Orthopedic Surgery

## 2019-02-11 NOTE — Telephone Encounter (Signed)
Debbie from [facility] called. She would like verbal orders to extend PT 1x wk for 5 wks. Her call back number is [phone]

## 2019-02-11 NOTE — Telephone Encounter (Signed)
Called and lm on vm to advise verbal ok to extend physical therapy orders. Pt is s/p a BKA to call with questions.

## 2019-02-14 ENCOUNTER — Ambulatory Visit (INDEPENDENT_AMBULATORY_CARE_PROVIDER_SITE_OTHER): Payer: Medicare HMO | Admitting: Orthopedic Surgery

## 2019-02-14 ENCOUNTER — Encounter: Payer: Self-pay | Admitting: Orthopedic Surgery

## 2019-02-14 ENCOUNTER — Other Ambulatory Visit: Payer: Self-pay

## 2019-02-14 VITALS — Ht 77.0 in | Wt 265.0 lb

## 2019-02-14 DIAGNOSIS — S88112A Complete traumatic amputation at level between knee and ankle, left lower leg, initial encounter: Secondary | ICD-10-CM

## 2019-02-14 DIAGNOSIS — Z89512 Acquired absence of left leg below knee: Secondary | ICD-10-CM

## 2019-02-14 NOTE — Progress Notes (Signed)
Office Visit Note   Patient: [name]           Date of Birth: 11-28-1960           MRN: [id number] Visit Date: 02/14/2019              Requested by: Arsenio Katz, NP [place],  [place] [address] PCP: Arsenio Katz, NP  Chief Complaint  Patient presents with  . Left Leg - Routine Post Op    01/12/19 left BKA       HPI: Patient is a 59 year old gentleman is 4 weeks status post left transtibial amputation currently has a 4 extra-large stump shrinker.  Assessment & Plan: Visit Diagnoses:  1. Below-knee amputation of left lower extremity ([place])     Plan: Harvest the staples today we will follow up with [employer] for a smaller shrinker he is given a prescription for K3 level prosthesis with [employer].  Follow-Up Instructions: Return in about 3 weeks (around 03/07/2019).   Ortho Exam  Patient is alert, oriented, no adenopathy, well-dressed, normal affect, normal respiratory effort. Examination patient has excellent healing of the incision there is no redness no cellulitis no drainage no signs of infection staples are harvested.  Imaging: No results found.   Labs: Lab Results  Component Value Date   HGBA1C 8.9 (H) 01/12/2019   HGBA1C 8.4 (H) 03/15/2017   HGBA1C 8.9 (H) 12/24/2016   REPTSTATUS 06/07/2013 FINAL 06/02/2013   REPTSTATUS 06/04/2013 FINAL 06/02/2013   GRAMSTAIN  06/02/2013    RARE WBC PRESENT,BOTH PMN AND MONONUCLEAR NO SQUAMOUS EPITHELIAL CELLS SEEN NO ORGANISMS SEEN Performed at [facility]  06/02/2013    RARE WBC PRESENT,BOTH PMN AND MONONUCLEAR NO SQUAMOUS EPITHELIAL CELLS SEEN NO ORGANISMS SEEN Performed at [facility]   CULT  06/02/2013    NO ANAEROBES ISOLATED Performed at [facility]  06/02/2013    NO GROWTH 2 DAYS Performed at [facility]     Lab Results  Component Value Date   ALBUMIN 4.0 04/06/2018   ALBUMIN 4.3 10/30/2017   ALBUMIN 4.3 03/13/2017    No results found  for: MG No results found for: VD25OH  No results found for: PREALBUMIN CBC EXTENDED Latest Ref Rng & Units 01/12/2019 12/22/2018 10/04/2018  WBC 4.0 - 10.5 K/uL 6.6 6.7 -  RBC 4.22 - 5.81 MIL/uL 4.24 4.80 -  HGB 13.0 - 17.0 g/dL 10.8(L) 12.7(L) 13.6  HCT 39.0 - 52.0 % 34.7(L) 42.3 40.0  PLT 150 - 400 K/uL 349 236 -  NEUTROABS 1.7 - 7.7 K/uL - - -  LYMPHSABS 0.7 - 4.0 K/uL - - -     Body mass index is 31.42 kg/m.  Orders:  No orders of the defined types were placed in this encounter.  No orders of the defined types were placed in this encounter.    Procedures: No procedures performed  Clinical Data: No additional findings.  ROS:  All other systems negative, except as noted in the HPI. Review of Systems  Objective: Vital Signs: Ht 6\' 5"  (1.956 m)   Wt 265 lb (120.2 kg)   BMI 31.42 kg/m   Specialty Comments:  No specialty comments available.  PMFS History: Patient Active Problem List   Diagnosis Date Noted  . Gangrene of left foot ([place]) 01/12/2019  . Subacute osteomyelitis, left ankle and foot ([place])   . Acute osteomyelitis of left calcaneus ([facility])   . Ulcer of left heel  and midfoot with necrosis of bone ([place])   . Gastroesophageal reflux disease without esophagitis 11/09/2017  . Abdominal pain, chronic, epigastric 11/09/2017  . Numbness   . Syncope 03/13/2017  . H/O cervical spine surgery 03/13/2017  . Cervical spondylosis with radiculopathy 12/29/2016  . CVA (cerebral vascular accident) ([place]) 08/30/2014  . Diabetes with skin complication ([place]) 123XX123  . Neuropathy 08/30/2014  . AKI (acute kidney injury) ([place]) 08/30/2014  . Bilateral occipital neuralgia 10/30/2013  . Lumbar stenosis without neurogenic claudication 01/12/2013  . Type 2 diabetes mellitus with vascular disease ([place])   . Hypertension   . Hyperlipidemia   . Gastroparesis   . Pulmonary embolus ([place]) 2012   . Obesity   . GERD 01/14/2010  . Chest pain 10/05/2003   Past Medical History:    Diagnosis Date  . Allergic rhinitis, cause unspecified   . Anginal pain ([place])   . Arthritis   . BPH (benign prostatic hyperplasia)   . Cervicalgia   . Chest pain Sept. 2005   multiple caths  /  cath 06/15/2010..normal coronaries,  EF 60%   (false postive nuclear in the past)  . CKD (chronic kidney disease), stage III   . Complication of anesthesia    Hard time waking up patient stated low blood pressures around 2017  . Dyslipidemia    mixed  . Esophageal reflux   . Fibromyalgia   . Gastroparesis   . Headache(784.0)   . History of kidney stones   . Hyperlipidemia   . Hypertension   . Hypoplasia of one kidney   . IDDM (insulin dependent diabetes mellitus)   . Lumbago   . Neuropathy associated with endocrine disorder ([place])   . Overweight(278.02)   . Peripheral vascular disease ([place])   . Pulmonary embolus ([facility])    Hx of small left lower lobe  pulmonary embolus  . Pulmonary embolus ([place]) 2010ish  . Stroke [facility])    mini stroke aug 2016  . Type II or unspecified type diabetes mellitus with neurological manifestations, not stated as uncontrolled(250.60)   . Urticaria, unspecified   . Wears glasses     Family History  Problem Relation Age of Onset  . Heart attack Father 53  . Hyperlipidemia Father   . Hypertension Father   . Heart attack Mother 56  . Diabetes Mother   . Hypertension Mother   . Hyperlipidemia Mother   . Healthy Brother   . Seizures Brother   . Migraines Neg Hx     Past Surgical History:  Procedure Laterality Date  . ACHILLES TENDON REPAIR Left 2013  . AMPUTATION Left 06/02/2013   Procedure: AMPUTATION 1ST TOE LEFT FOOT;  Surgeon: Marcheta Grammes, DPM;  [facility];  Service: Orthopedics;  Laterality: Left;  . AMPUTATION Left 07/21/2013   Procedure: PARTIAL AMPUTATION 2ND TOE LEFT FOOT;  Surgeon: Marcheta Grammes, DPM;  [facility];  Service: Podiatry;  Laterality: Left;  . AMPUTATION Left 01/12/2019   Procedure: LEFT BELOW KNEE  AMPUTATION;  Surgeon: Newt Minion, MD;  Location: [place];  Service: Orthopedics;  Laterality: Left;  . ANTERIOR CERVICAL DECOMP/DISCECTOMY FUSION N/A 12/29/2016   Procedure: Cervical five-six Cervical six-seven Anterior cervical discectomy with fusion and plate fixation;  Surgeon: Ditty, Kevan Ny, MD;  Location: [place];  Service: Neurosurgery;  Laterality: N/A;  . APPENDECTOMY    . BACK SURGERY  1999   x 6 some fusions  . BIOPSY  12/18/2017   Procedure: BIOPSY;  Surgeon: Hildred Laser  U, MD;  [facility];  Service: Endoscopy;;  gastric and esophagus  . colonscopy    . ESOPHAGOGASTRODUODENOSCOPY (EGD) WITH PROPOFOL N/A 12/18/2017   Procedure: ESOPHAGOGASTRODUODENOSCOPY (EGD) WITH PROPOFOL;  Surgeon: Rogene Houston, MD;  [facility];  Service: Endoscopy;  Laterality: N/A;  9:25  . FOOT ARTHRODESIS Right 01/11/2014   Procedure: ARTHRODESIS INTERPHALANGEAL JOINT HALLUX RIGHT FOOT;  Surgeon: Marcheta Grammes, DPM;  [facility];  Service: Podiatry;  Laterality: Right;  . HARDWARE REMOVAL Right 01/17/2015   Procedure: HARDWARE REMOVAL;  Surgeon: Caprice Beaver, DPM;  Location[facility];  Service: Podiatry;  Laterality: Right;  . I & D EXTREMITY Left 12/22/2018   Procedure: LEFT PARTIAL CALCANEAL EXCISION;  Surgeon: Newt Minion, MD;  Location: [place];  Service: Orthopedics;  Laterality: Left;  . KNEE ARTHROSCOPY Right 10/2015  . LUMBAR LAMINECTOMY/DECOMPRESSION MICRODISCECTOMY Right 01/12/2013   Procedure: Right Lumbar Three-Four Laminotomy/Foraminotomy;  Surgeon: Floyce Stakes, MD;  [facility];  Service: Neurosurgery;  Laterality: Right;  Right Lumbar Three-Four Laminotomy/Foraminotomy  . NISSEN FUNDOPLICATION    . SHOULDER ARTHROSCOPY W/ ROTATOR CUFF REPAIR Right   . SHOULDER ARTHROSCOPY WITH DISTAL CLAVICLE RESECTION Right 10/04/2018   Procedure: RIGHT SHOULDER ARTHROSCOPY WITH DISTAL CLAVICLE RESECTION, EXTENSIVE DEBRIDEMENT, SUBACROMIAL  PARTIAL ACROMIOPLASTY,;  Surgeon: Earlie Server, MD;  Location: [facility];  Service: Orthopedics;  Laterality: Right;  PRE/POST OP SCALENE   Social History   Occupational History  . Occupation: Retired- Research officer, trade union  Tobacco Use  . Smoking status: Former Smoker    Packs/day: 3.00    Years: 5.00    Pack years: 15.00    Types: Cigarettes    Start date: 06/20/1977    Quit date: 07/20/1982    Years since quitting: 36.5  . Smokeless tobacco: Never Used  . Tobacco comment: quit 1980's  Substance and Sexual Activity  . Alcohol use: No    Alcohol/week: 0.0 standard drinks  . Drug use: No  . Sexual activity: Yes    Birth control/protection: None

## 2019-02-15 DIAGNOSIS — E1122 Type 2 diabetes mellitus with diabetic chronic kidney disease: Secondary | ICD-10-CM | POA: Diagnosis not present

## 2019-02-15 DIAGNOSIS — E1151 Type 2 diabetes mellitus with diabetic peripheral angiopathy without gangrene: Secondary | ICD-10-CM | POA: Diagnosis not present

## 2019-02-15 DIAGNOSIS — E1143 Type 2 diabetes mellitus with diabetic autonomic (poly)neuropathy: Secondary | ICD-10-CM | POA: Diagnosis not present

## 2019-02-15 DIAGNOSIS — Z89512 Acquired absence of left leg below knee: Secondary | ICD-10-CM | POA: Diagnosis not present

## 2019-02-15 DIAGNOSIS — M1990 Unspecified osteoarthritis, unspecified site: Secondary | ICD-10-CM | POA: Diagnosis not present

## 2019-02-15 DIAGNOSIS — N183 Chronic kidney disease, stage 3 unspecified: Secondary | ICD-10-CM | POA: Diagnosis not present

## 2019-02-15 DIAGNOSIS — I129 Hypertensive chronic kidney disease with stage 1 through stage 4 chronic kidney disease, or unspecified chronic kidney disease: Secondary | ICD-10-CM | POA: Diagnosis not present

## 2019-02-15 DIAGNOSIS — I209 Angina pectoris, unspecified: Secondary | ICD-10-CM | POA: Diagnosis not present

## 2019-02-15 DIAGNOSIS — Z4781 Encounter for orthopedic aftercare following surgical amputation: Secondary | ICD-10-CM | POA: Diagnosis not present

## 2019-02-15 DIAGNOSIS — M797 Fibromyalgia: Secondary | ICD-10-CM | POA: Diagnosis not present

## 2019-02-16 DIAGNOSIS — S88112A Complete traumatic amputation at level between knee and ankle, left lower leg, initial encounter: Secondary | ICD-10-CM | POA: Diagnosis not present

## 2019-02-16 DIAGNOSIS — I129 Hypertensive chronic kidney disease with stage 1 through stage 4 chronic kidney disease, or unspecified chronic kidney disease: Secondary | ICD-10-CM | POA: Diagnosis not present

## 2019-02-16 DIAGNOSIS — I739 Peripheral vascular disease, unspecified: Secondary | ICD-10-CM | POA: Diagnosis not present

## 2019-02-16 DIAGNOSIS — E1140 Type 2 diabetes mellitus with diabetic neuropathy, unspecified: Secondary | ICD-10-CM | POA: Diagnosis not present

## 2019-02-16 DIAGNOSIS — E1122 Type 2 diabetes mellitus with diabetic chronic kidney disease: Secondary | ICD-10-CM | POA: Diagnosis not present

## 2019-02-16 DIAGNOSIS — E1165 Type 2 diabetes mellitus with hyperglycemia: Secondary | ICD-10-CM | POA: Diagnosis not present

## 2019-02-16 DIAGNOSIS — Z87891 Personal history of nicotine dependence: Secondary | ICD-10-CM | POA: Diagnosis not present

## 2019-02-16 DIAGNOSIS — E1143 Type 2 diabetes mellitus with diabetic autonomic (poly)neuropathy: Secondary | ICD-10-CM | POA: Diagnosis not present

## 2019-02-16 DIAGNOSIS — M797 Fibromyalgia: Secondary | ICD-10-CM | POA: Diagnosis not present

## 2019-02-16 DIAGNOSIS — I209 Angina pectoris, unspecified: Secondary | ICD-10-CM | POA: Diagnosis not present

## 2019-02-16 DIAGNOSIS — M1990 Unspecified osteoarthritis, unspecified site: Secondary | ICD-10-CM | POA: Diagnosis not present

## 2019-02-16 DIAGNOSIS — Z89512 Acquired absence of left leg below knee: Secondary | ICD-10-CM | POA: Diagnosis not present

## 2019-02-16 DIAGNOSIS — Z4781 Encounter for orthopedic aftercare following surgical amputation: Secondary | ICD-10-CM | POA: Diagnosis not present

## 2019-02-16 DIAGNOSIS — N183 Chronic kidney disease, stage 3 unspecified: Secondary | ICD-10-CM | POA: Diagnosis not present

## 2019-02-16 DIAGNOSIS — Z6832 Body mass index (BMI) 32.0-32.9, adult: Secondary | ICD-10-CM | POA: Diagnosis not present

## 2019-02-16 DIAGNOSIS — Z299 Encounter for prophylactic measures, unspecified: Secondary | ICD-10-CM | POA: Diagnosis not present

## 2019-02-16 DIAGNOSIS — E1151 Type 2 diabetes mellitus with diabetic peripheral angiopathy without gangrene: Secondary | ICD-10-CM | POA: Diagnosis not present

## 2019-02-17 DIAGNOSIS — M86272 Subacute osteomyelitis, left ankle and foot: Secondary | ICD-10-CM | POA: Diagnosis not present

## 2019-02-23 DIAGNOSIS — E1151 Type 2 diabetes mellitus with diabetic peripheral angiopathy without gangrene: Secondary | ICD-10-CM | POA: Diagnosis not present

## 2019-02-23 DIAGNOSIS — I129 Hypertensive chronic kidney disease with stage 1 through stage 4 chronic kidney disease, or unspecified chronic kidney disease: Secondary | ICD-10-CM | POA: Diagnosis not present

## 2019-02-23 DIAGNOSIS — Z4781 Encounter for orthopedic aftercare following surgical amputation: Secondary | ICD-10-CM | POA: Diagnosis not present

## 2019-02-23 DIAGNOSIS — E1143 Type 2 diabetes mellitus with diabetic autonomic (poly)neuropathy: Secondary | ICD-10-CM | POA: Diagnosis not present

## 2019-02-23 DIAGNOSIS — M1990 Unspecified osteoarthritis, unspecified site: Secondary | ICD-10-CM | POA: Diagnosis not present

## 2019-02-23 DIAGNOSIS — M797 Fibromyalgia: Secondary | ICD-10-CM | POA: Diagnosis not present

## 2019-02-23 DIAGNOSIS — E1122 Type 2 diabetes mellitus with diabetic chronic kidney disease: Secondary | ICD-10-CM | POA: Diagnosis not present

## 2019-02-23 DIAGNOSIS — N183 Chronic kidney disease, stage 3 unspecified: Secondary | ICD-10-CM | POA: Diagnosis not present

## 2019-02-23 DIAGNOSIS — I209 Angina pectoris, unspecified: Secondary | ICD-10-CM | POA: Diagnosis not present

## 2019-02-23 DIAGNOSIS — Z89512 Acquired absence of left leg below knee: Secondary | ICD-10-CM | POA: Diagnosis not present

## 2019-03-02 DIAGNOSIS — E1143 Type 2 diabetes mellitus with diabetic autonomic (poly)neuropathy: Secondary | ICD-10-CM | POA: Diagnosis not present

## 2019-03-02 DIAGNOSIS — I129 Hypertensive chronic kidney disease with stage 1 through stage 4 chronic kidney disease, or unspecified chronic kidney disease: Secondary | ICD-10-CM | POA: Diagnosis not present

## 2019-03-02 DIAGNOSIS — Z89512 Acquired absence of left leg below knee: Secondary | ICD-10-CM | POA: Diagnosis not present

## 2019-03-02 DIAGNOSIS — N183 Chronic kidney disease, stage 3 unspecified: Secondary | ICD-10-CM | POA: Diagnosis not present

## 2019-03-02 DIAGNOSIS — Z4781 Encounter for orthopedic aftercare following surgical amputation: Secondary | ICD-10-CM | POA: Diagnosis not present

## 2019-03-02 DIAGNOSIS — E1151 Type 2 diabetes mellitus with diabetic peripheral angiopathy without gangrene: Secondary | ICD-10-CM | POA: Diagnosis not present

## 2019-03-02 DIAGNOSIS — E1122 Type 2 diabetes mellitus with diabetic chronic kidney disease: Secondary | ICD-10-CM | POA: Diagnosis not present

## 2019-03-02 DIAGNOSIS — M797 Fibromyalgia: Secondary | ICD-10-CM | POA: Diagnosis not present

## 2019-03-02 DIAGNOSIS — M1990 Unspecified osteoarthritis, unspecified site: Secondary | ICD-10-CM | POA: Diagnosis not present

## 2019-03-02 DIAGNOSIS — I209 Angina pectoris, unspecified: Secondary | ICD-10-CM | POA: Diagnosis not present

## 2019-03-07 ENCOUNTER — Encounter: Payer: Self-pay | Admitting: Orthopedic Surgery

## 2019-03-07 ENCOUNTER — Other Ambulatory Visit: Payer: Self-pay

## 2019-03-07 ENCOUNTER — Ambulatory Visit (INDEPENDENT_AMBULATORY_CARE_PROVIDER_SITE_OTHER): Payer: Medicare HMO | Admitting: Orthopedic Surgery

## 2019-03-07 VITALS — Ht 77.0 in | Wt 265.0 lb

## 2019-03-07 DIAGNOSIS — M86172 Other acute osteomyelitis, left ankle and foot: Secondary | ICD-10-CM

## 2019-03-07 NOTE — Progress Notes (Signed)
Office Visit Note   Patient: [name]           Date of Birth: [birth year]           MRN: [id number] Visit Date: 03/07/2019              Requested by: Arsenio Katz, NP [place],  [place] [address] PCP: Arsenio Katz, NP  Chief Complaint  Patient presents with  . Left Leg - Routine Post Op    01/12/19 left bka       HPI: This is a pleasant gentleman who is 7 weeks status post left below knee amputation.  He is doing very well and is scheduled to have a remolding for his socket.  He has no complaints  Assessment & Plan: Visit Diagnoses: No diagnosis found.  Plan: He will go forward with having a socket molded for his new leg.  He should follow-up with [place] in about a month to 5 weeks  Follow-Up Instructions: No follow-ups on file.   Ortho Exam  Patient is alert, oriented, no adenopathy, well-dressed, normal affect, normal respiratory effort. Focused examination of the amputation stump is well-healed.  He has 2 small areas of eschar which were debrided today and there was healthy tissue beneath.  He has full knee range of motion.  No cellulitis no fluctuance no drainage  Imaging: No results found. No images are attached to the encounter.  Labs: Lab Results  Component Value Date   HGBA1C 8.9 (H) 01/12/2019   HGBA1C 8.4 (H) 03/15/2017   HGBA1C 8.9 (H) 12/24/2016   REPTSTATUS 06/07/2013 FINAL 06/02/2013   REPTSTATUS 06/04/2013 FINAL 06/02/2013   GRAMSTAIN  06/02/2013    RARE WBC PRESENT,BOTH PMN AND MONONUCLEAR NO SQUAMOUS EPITHELIAL CELLS SEEN NO ORGANISMS SEEN Performed at [facility]  06/02/2013    RARE WBC PRESENT,BOTH PMN AND MONONUCLEAR NO SQUAMOUS EPITHELIAL CELLS SEEN NO ORGANISMS SEEN Performed at [facility]   CULT  06/02/2013    NO ANAEROBES ISOLATED Performed at [facility]  06/02/2013    NO GROWTH 2 DAYS Performed at [facility]     Lab Results  Component Value Date   ALBUMIN  4.0 04/06/2018   ALBUMIN 4.3 10/30/2017   ALBUMIN 4.3 03/13/2017    No results found for: MG No results found for: VD25OH  No results found for: PREALBUMIN CBC EXTENDED Latest Ref Rng & Units 01/12/2019 12/22/2018 10/04/2018  WBC 4.0 - 10.5 K/uL 6.6 6.7 -  RBC 4.22 - 5.81 MIL/uL 4.24 4.80 -  HGB 13.0 - 17.0 g/dL 10.8(L) 12.7(L) 13.6  HCT 39.0 - 52.0 % 34.7(L) 42.3 40.0  PLT 150 - 400 K/uL 349 236 -  NEUTROABS 1.7 - 7.7 K/uL - - -  LYMPHSABS 0.7 - 4.0 K/uL - - -     Body mass index is 31.42 kg/m.  Orders:  No orders of the defined types were placed in this encounter.  No orders of the defined types were placed in this encounter.    Procedures: No procedures performed  Clinical Data: No additional findings.  ROS:  All other systems negative, except as noted in the HPI. Review of Systems  Objective: Vital Signs: Ht 6\' 5"  (1.956 m)   Wt 265 lb (120.2 kg)   BMI 31.42 kg/m   Specialty Comments:  No specialty comments available.  PMFS History: Patient Active Problem List   Diagnosis Date Noted  . Gangrene of  left foot ([place]) 01/12/2019  . Subacute osteomyelitis, left ankle and foot ([place])   . Acute osteomyelitis of left calcaneus (HCC)   . Ulcer of left heel and midfoot with necrosis of bone ([place])   . Gastroesophageal reflux disease without esophagitis 11/09/2017  . Abdominal pain, chronic, epigastric 11/09/2017  . Numbness   . Syncope 03/13/2017  . H/O cervical spine surgery 03/13/2017  . Cervical spondylosis with radiculopathy 12/29/2016  . CVA (cerebral vascular accident) ([place]) 08/30/2014  . Diabetes with skin complication ([place]) 123XX123  . Neuropathy 08/30/2014  . AKI (acute kidney injury) ([place]) 08/30/2014  . Bilateral occipital neuralgia 10/30/2013  . Lumbar stenosis without neurogenic claudication 01/12/2013  . Type 2 diabetes mellitus with vascular disease ([place])   . Hypertension   . Hyperlipidemia   . Gastroparesis   . Pulmonary embolus ([place]) 2012    . Obesity   . GERD 01/14/2010  . Chest pain 10/05/2003   Past Medical History:  Diagnosis Date  . Allergic rhinitis, cause unspecified   . Anginal pain ([place])   . Arthritis   . BPH (benign prostatic hyperplasia)   . Cervicalgia   . Chest pain Sept. 2005   multiple caths  /  cath 06/15/2010..normal coronaries,  EF 60%   (false postive nuclear in the past)  . CKD (chronic kidney disease), stage III   . Complication of anesthesia    Hard time waking up patient stated low blood pressures around 2017  . Dyslipidemia    mixed  . Esophageal reflux   . Fibromyalgia   . Gastroparesis   . Headache(784.0)   . History of kidney stones   . Hyperlipidemia   . Hypertension   . Hypoplasia of one kidney   . IDDM (insulin dependent diabetes mellitus)   . Lumbago   . Neuropathy associated with endocrine disorder ([place])   . Overweight(278.02)   . Peripheral vascular disease ([place])   . Pulmonary embolus (HCC)    Hx of small left lower lobe  pulmonary embolus  . Pulmonary embolus ([place]) 2010ish  . Stroke [facility])    mini stroke aug 2016  . Type II or unspecified type diabetes mellitus with neurological manifestations, not stated as uncontrolled(250.60)   . Urticaria, unspecified   . Wears glasses     Family History  Problem Relation Age of Onset  . Heart attack Father 47  . Hyperlipidemia Father   . Hypertension Father   . Heart attack Mother 48  . Diabetes Mother   . Hypertension Mother   . Hyperlipidemia Mother   . Healthy Brother   . Seizures Brother   . Migraines Neg Hx     Past Surgical History:  Procedure Laterality Date  . ACHILLES TENDON REPAIR Left 2013  . AMPUTATION Left 06/02/2013   Procedure: AMPUTATION 1ST TOE LEFT FOOT;  Surgeon: Marcheta Grammes, DPM;  [facility];  Service: Orthopedics;  Laterality: Left;  . AMPUTATION Left 07/21/2013   Procedure: PARTIAL AMPUTATION 2ND TOE LEFT FOOT;  Surgeon: Marcheta Grammes, DPM;  [facility];  Service:  Podiatry;  Laterality: Left;  . AMPUTATION Left 01/12/2019   Procedure: LEFT BELOW KNEE AMPUTATION;  Surgeon: Newt Minion, MD;  Location: [facility];  Service: Orthopedics;  Laterality: Left;  . ANTERIOR CERVICAL DECOMP/DISCECTOMY FUSION N/A 12/29/2016   Procedure: Cervical five-six Cervical six-seven Anterior cervical discectomy with fusion and plate fixation;  Surgeon: Ditty, Kevan Ny, MD;  Location: [facility];  Service: Neurosurgery;  Laterality: N/A;  .  APPENDECTOMY    . BACK SURGERY  1999   x 6 some fusions  . BIOPSY  12/18/2017   Procedure: BIOPSY;  Surgeon: Rogene Houston, MD;  [facility];  Service: Endoscopy;;  gastric and esophagus  . colonscopy    . ESOPHAGOGASTRODUODENOSCOPY (EGD) WITH PROPOFOL N/A 12/18/2017   Procedure: ESOPHAGOGASTRODUODENOSCOPY (EGD) WITH PROPOFOL;  Surgeon: Rogene Houston, MD;  [facility];  Service: Endoscopy;  Laterality: N/A;  9:25  . FOOT ARTHRODESIS Right 01/11/2014   Procedure: ARTHRODESIS INTERPHALANGEAL JOINT HALLUX RIGHT FOOT;  Surgeon: Marcheta Grammes, DPM;  Location[facility];  Service: Podiatry;  Laterality: Right;  . HARDWARE REMOVAL Right 01/17/2015   Procedure: HARDWARE REMOVAL;  Surgeon: Caprice Beaver, DPM;  Location: [facility];  Service: Podiatry;  Laterality: Right;  . I & D EXTREMITY Left 12/22/2018   Procedure: LEFT PARTIAL CALCANEAL EXCISION;  Surgeon: Newt Minion, MD;  Location: [facility];  Service: Orthopedics;  Laterality: Left;  . KNEE ARTHROSCOPY Right 10/2015  . LUMBAR LAMINECTOMY/DECOMPRESSION MICRODISCECTOMY Right 01/12/2013   Procedure: Right Lumbar Three-Four Laminotomy/Foraminotomy;  Surgeon: Floyce Stakes, MD;  [facility];  Service: Neurosurgery;  Laterality: Right;  Right Lumbar Three-Four Laminotomy/Foraminotomy  . NISSEN FUNDOPLICATION    . SHOULDER ARTHROSCOPY W/ ROTATOR CUFF REPAIR Right   . SHOULDER ARTHROSCOPY WITH DISTAL CLAVICLE RESECTION Right 10/04/2018   Procedure:  RIGHT SHOULDER ARTHROSCOPY WITH DISTAL CLAVICLE RESECTION, EXTENSIVE DEBRIDEMENT, SUBACROMIAL PARTIAL ACROMIOPLASTY,;  Surgeon: Earlie Server, MD;  Location: [facility];  Service: Orthopedics;  Laterality: Right;  PRE/POST OP SCALENE   Social History   Occupational History  . Occupation: Retired- Research officer, trade union  Tobacco Use  . Smoking status: Former Smoker    Packs/day: 3.00    Years: 5.00    Pack years: 15.00    Types: Cigarettes    Start date: 06/20/1977    Quit date: 07/20/1982    Years since quitting: 36.6  . Smokeless tobacco: Never Used  . Tobacco comment: quit 1980's  Substance and Sexual Activity  . Alcohol use: No    Alcohol/week: 0.0 standard drinks  . Drug use: No  . Sexual activity: Yes    Birth control/protection: None

## 2019-03-08 DIAGNOSIS — M1990 Unspecified osteoarthritis, unspecified site: Secondary | ICD-10-CM | POA: Diagnosis not present

## 2019-03-08 DIAGNOSIS — M797 Fibromyalgia: Secondary | ICD-10-CM | POA: Diagnosis not present

## 2019-03-08 DIAGNOSIS — N183 Chronic kidney disease, stage 3 unspecified: Secondary | ICD-10-CM | POA: Diagnosis not present

## 2019-03-08 DIAGNOSIS — I129 Hypertensive chronic kidney disease with stage 1 through stage 4 chronic kidney disease, or unspecified chronic kidney disease: Secondary | ICD-10-CM | POA: Diagnosis not present

## 2019-03-08 DIAGNOSIS — Z89512 Acquired absence of left leg below knee: Secondary | ICD-10-CM | POA: Diagnosis not present

## 2019-03-08 DIAGNOSIS — Z4781 Encounter for orthopedic aftercare following surgical amputation: Secondary | ICD-10-CM | POA: Diagnosis not present

## 2019-03-08 DIAGNOSIS — E1143 Type 2 diabetes mellitus with diabetic autonomic (poly)neuropathy: Secondary | ICD-10-CM | POA: Diagnosis not present

## 2019-03-08 DIAGNOSIS — I209 Angina pectoris, unspecified: Secondary | ICD-10-CM | POA: Diagnosis not present

## 2019-03-08 DIAGNOSIS — E1122 Type 2 diabetes mellitus with diabetic chronic kidney disease: Secondary | ICD-10-CM | POA: Diagnosis not present

## 2019-03-08 DIAGNOSIS — E1151 Type 2 diabetes mellitus with diabetic peripheral angiopathy without gangrene: Secondary | ICD-10-CM | POA: Diagnosis not present

## 2019-03-16 DIAGNOSIS — E559 Vitamin D deficiency, unspecified: Secondary | ICD-10-CM | POA: Diagnosis not present

## 2019-03-16 DIAGNOSIS — N189 Chronic kidney disease, unspecified: Secondary | ICD-10-CM | POA: Diagnosis not present

## 2019-03-16 DIAGNOSIS — E872 Acidosis: Secondary | ICD-10-CM | POA: Diagnosis not present

## 2019-03-16 DIAGNOSIS — Z299 Encounter for prophylactic measures, unspecified: Secondary | ICD-10-CM | POA: Diagnosis not present

## 2019-03-16 DIAGNOSIS — Z6832 Body mass index (BMI) 32.0-32.9, adult: Secondary | ICD-10-CM | POA: Diagnosis not present

## 2019-03-16 DIAGNOSIS — I739 Peripheral vascular disease, unspecified: Secondary | ICD-10-CM | POA: Diagnosis not present

## 2019-03-16 DIAGNOSIS — E1165 Type 2 diabetes mellitus with hyperglycemia: Secondary | ICD-10-CM | POA: Diagnosis not present

## 2019-03-16 DIAGNOSIS — E785 Hyperlipidemia, unspecified: Secondary | ICD-10-CM | POA: Diagnosis not present

## 2019-03-16 DIAGNOSIS — I129 Hypertensive chronic kidney disease with stage 1 through stage 4 chronic kidney disease, or unspecified chronic kidney disease: Secondary | ICD-10-CM | POA: Diagnosis not present

## 2019-03-16 DIAGNOSIS — E1122 Type 2 diabetes mellitus with diabetic chronic kidney disease: Secondary | ICD-10-CM | POA: Diagnosis not present

## 2019-03-20 DIAGNOSIS — M86272 Subacute osteomyelitis, left ankle and foot: Secondary | ICD-10-CM | POA: Diagnosis not present

## 2019-03-23 ENCOUNTER — Other Ambulatory Visit: Payer: Self-pay | Admitting: Cardiovascular Disease

## 2019-03-25 DIAGNOSIS — E559 Vitamin D deficiency, unspecified: Secondary | ICD-10-CM | POA: Diagnosis not present

## 2019-03-25 DIAGNOSIS — I129 Hypertensive chronic kidney disease with stage 1 through stage 4 chronic kidney disease, or unspecified chronic kidney disease: Secondary | ICD-10-CM | POA: Diagnosis not present

## 2019-03-25 DIAGNOSIS — E1122 Type 2 diabetes mellitus with diabetic chronic kidney disease: Secondary | ICD-10-CM | POA: Diagnosis not present

## 2019-03-25 DIAGNOSIS — Z79899 Other long term (current) drug therapy: Secondary | ICD-10-CM | POA: Diagnosis not present

## 2019-03-25 DIAGNOSIS — N189 Chronic kidney disease, unspecified: Secondary | ICD-10-CM | POA: Diagnosis not present

## 2019-03-25 DIAGNOSIS — E872 Acidosis: Secondary | ICD-10-CM | POA: Diagnosis not present

## 2019-04-04 ENCOUNTER — Encounter: Payer: Self-pay | Admitting: Orthopedic Surgery

## 2019-04-04 ENCOUNTER — Other Ambulatory Visit: Payer: Self-pay

## 2019-04-04 ENCOUNTER — Ambulatory Visit (INDEPENDENT_AMBULATORY_CARE_PROVIDER_SITE_OTHER): Payer: Medicare HMO | Admitting: Physician Assistant

## 2019-04-04 VITALS — Ht 77.0 in | Wt 265.0 lb

## 2019-04-04 DIAGNOSIS — M86172 Other acute osteomyelitis, left ankle and foot: Secondary | ICD-10-CM

## 2019-04-04 NOTE — Progress Notes (Signed)
Office Visit Note   Patient: [name]           Date of Birth: 12/12/1960           MRN: [id number] Visit Date: 04/04/2019              Requested by: Arsenio Katz, NP [place],  [place] [address] PCP: Arsenio Katz, NP  Chief Complaint  Patient presents with  . 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  He is doing well.  He is having some superficial scabbing on the amputation stump that the sock debrides.  He has no drainage and is due to be fit for his prosthetic tomorrow  Assessment & Plan: Visit Diagnoses: No diagnosis found.  Plan: He will obtain his prosthetic begin physical therapy.  I noted the importance for him to keep examining his amputation stump especially after he gets the prosthetic.  Follow-up 1 month  Follow-Up Instructions: No follow-ups on file.   Ortho Exam  Patient is alert, oriented, no adenopathy, well-dressed, normal affect, normal respiratory effort. Focused examination demonstrates amputation stump healed.  He has 2 small scabs which are superficial no surrounding erythema or cellulitis swelling is well controlled  Imaging: No results found. No images are attached to the encounter.  Labs: Lab Results  Component Value Date   HGBA1C 8.9 (H) 01/12/2019   HGBA1C 8.4 (H) 03/15/2017   HGBA1C 8.9 (H) 12/24/2016   REPTSTATUS 06/07/2013 FINAL 06/02/2013   REPTSTATUS 06/04/2013 FINAL 06/02/2013   GRAMSTAIN  06/02/2013    RARE WBC PRESENT,BOTH PMN AND MONONUCLEAR NO SQUAMOUS EPITHELIAL CELLS SEEN NO ORGANISMS SEEN Performed at [facility]  06/02/2013    RARE WBC PRESENT,BOTH PMN AND MONONUCLEAR NO SQUAMOUS EPITHELIAL CELLS SEEN NO ORGANISMS SEEN Performed at [facility]   CULT  06/02/2013    NO ANAEROBES ISOLATED Performed at [facility]  06/02/2013    NO GROWTH 2 DAYS Performed at [facility]     Lab  Results  Component Value Date   ALBUMIN 4.0 04/06/2018   ALBUMIN 4.3 10/30/2017   ALBUMIN 4.3 03/13/2017    No results found for: MG No results found for: VD25OH  No results found for: PREALBUMIN CBC EXTENDED Latest Ref Rng & Units 01/12/2019 12/22/2018 10/04/2018  WBC 4.0 - 10.5 K/uL 6.6 6.7 -  RBC 4.22 - 5.81 MIL/uL 4.24 4.80 -  HGB 13.0 - 17.0 g/dL 10.8(L) 12.7(L) 13.6  HCT 39.0 - 52.0 % 34.7(L) 42.3 40.0  PLT 150 - 400 K/uL 349 236 -  NEUTROABS 1.7 - 7.7 K/uL - - -  LYMPHSABS 0.7 - 4.0 K/uL - - -     Body mass index is 31.42 kg/m.  Orders:  No orders of the defined types were placed in this encounter.  No orders of the defined types were placed in this encounter.    Procedures: No procedures performed  Clinical Data: No additional findings.  ROS:  All other systems negative, except as noted in the HPI. Review of Systems  Objective: Vital Signs: Ht 6\' 5"  (1.956 m)   Wt 265 lb (120.2 kg)   BMI 31.42 kg/m   Specialty Comments:  No specialty comments available.  PMFS History: Patient Active Problem List   Diagnosis Date Noted  . Gangrene of left foot ([place]) 01/12/2019  . Subacute osteomyelitis, left ankle and  foot ([place])   . Acute osteomyelitis of left calcaneus (HCC)   . Ulcer of left heel and midfoot with necrosis of bone ([place])   . Gastroesophageal reflux disease without esophagitis 11/09/2017  . Abdominal pain, chronic, epigastric 11/09/2017  . Numbness   . Syncope 03/13/2017  . H/O cervical spine surgery 03/13/2017  . Cervical spondylosis with radiculopathy 12/29/2016  . CVA (cerebral vascular accident) ([place]) 08/30/2014  . Diabetes with skin complication ([place]) 123XX123  . Neuropathy 08/30/2014  . AKI (acute kidney injury) ([place]) 08/30/2014  . Bilateral occipital neuralgia 10/30/2013  . Lumbar stenosis without neurogenic claudication 01/12/2013  . Type 2 diabetes mellitus with vascular disease ([place])   . Hypertension   . Hyperlipidemia   .  Gastroparesis   . Pulmonary embolus ([place]) 2012   . Obesity   . GERD 01/14/2010  . Chest pain 10/05/2003   Past Medical History:  Diagnosis Date  . Allergic rhinitis, cause unspecified   . Anginal pain ([place])   . Arthritis   . BPH (benign prostatic hyperplasia)   . Cervicalgia   . Chest pain Sept. 2005   multiple caths  /  cath 06/15/2010..normal coronaries,  EF 60%   (false postive nuclear in the past)  . CKD (chronic kidney disease), stage III   . Complication of anesthesia    Hard time waking up patient stated low blood pressures around 2017  . Dyslipidemia    mixed  . Esophageal reflux   . Fibromyalgia   . Gastroparesis   . Headache(784.0)   . History of kidney stones   . Hyperlipidemia   . Hypertension   . Hypoplasia of one kidney   . IDDM (insulin dependent diabetes mellitus)   . Lumbago   . Neuropathy associated with endocrine disorder ([place])   . Overweight(278.02)   . Peripheral vascular disease ([place])   . Pulmonary embolus (HCC)    Hx of small left lower lobe  pulmonary embolus  . Pulmonary embolus ([place]) 2010ish  . Stroke [facility])    mini stroke aug 2016  . Type II or unspecified type diabetes mellitus with neurological manifestations, not stated as uncontrolled(250.60)   . Urticaria, unspecified   . Wears glasses     Family History  Problem Relation Age of Onset  . Heart attack Father 49  . Hyperlipidemia Father   . Hypertension Father   . Heart attack Mother 41  . Diabetes Mother   . Hypertension Mother   . Hyperlipidemia Mother   . Healthy Brother   . Seizures Brother   . Migraines Neg Hx     Past Surgical History:  Procedure Laterality Date  . ACHILLES TENDON REPAIR Left 2013  . AMPUTATION Left 06/02/2013   Procedure: AMPUTATION 1ST TOE LEFT FOOT;  Surgeon: Marcheta Grammes, DPM;  [facility];  Service: Orthopedics;  Laterality: Left;  . AMPUTATION Left 07/21/2013   Procedure: PARTIAL AMPUTATION 2ND TOE LEFT FOOT;  Surgeon: Marcheta Grammes, DPM;  Location: [facility];  Service: Podiatry;  Laterality: Left;  . AMPUTATION Left 01/12/2019   Procedure: LEFT BELOW KNEE AMPUTATION;  Surgeon: Newt Minion, MD;  Location: [facility];  Service: Orthopedics;  Laterality: Left;  . ANTERIOR CERVICAL DECOMP/DISCECTOMY FUSION N/A 12/29/2016   Procedure: Cervical five-six Cervical six-seven Anterior cervical discectomy with fusion and plate fixation;  Surgeon: Ditty, Kevan Ny, MD;  Location: [facility];  Service: Neurosurgery;  Laterality: N/A;  . APPENDECTOMY    . [place]  x 6 some fusions  . BIOPSY  12/18/2017   Procedure: BIOPSY;  Surgeon: Rogene Houston, MD;  [facility];  Service: Endoscopy;;  gastric and esophagus  . colonscopy    . ESOPHAGOGASTRODUODENOSCOPY (EGD) WITH PROPOFOL N/A 12/18/2017   Procedure: ESOPHAGOGASTRODUODENOSCOPY (EGD) WITH PROPOFOL;  Surgeon: Rogene Houston, MD;  [facility];  Service: Endoscopy;  Laterality: N/A;  9:25  . FOOT ARTHRODESIS Right 01/11/2014   Procedure: ARTHRODESIS INTERPHALANGEAL JOINT HALLUX RIGHT FOOT;  Surgeon: Marcheta Grammes, DPM;  Location[facility];  Service: Podiatry;  Laterality: Right;  . HARDWARE REMOVAL Right 01/17/2015   Procedure: HARDWARE REMOVAL;  Surgeon: Caprice Beaver, DPM;  [facility];  Service: Podiatry;  Laterality: Right;  . I & D EXTREMITY Left 12/22/2018   Procedure: LEFT PARTIAL CALCANEAL EXCISION;  Surgeon: Newt Minion, MD;  Location: [facility];  Service: Orthopedics;  Laterality: Left;  . KNEE ARTHROSCOPY Right 10/2015  . LUMBAR LAMINECTOMY/DECOMPRESSION MICRODISCECTOMY Right 01/12/2013   Procedure: Right Lumbar Three-Four Laminotomy/Foraminotomy;  Surgeon: Floyce Stakes, MD;  [facility];  Service: Neurosurgery;  Laterality: Right;  Right Lumbar Three-Four Laminotomy/Foraminotomy  . NISSEN FUNDOPLICATION    . SHOULDER ARTHROSCOPY W/ ROTATOR CUFF REPAIR Right   . SHOULDER ARTHROSCOPY WITH DISTAL CLAVICLE  RESECTION Right 10/04/2018   Procedure: RIGHT SHOULDER ARTHROSCOPY WITH DISTAL CLAVICLE RESECTION, EXTENSIVE DEBRIDEMENT, SUBACROMIAL PARTIAL ACROMIOPLASTY,;  Surgeon: Earlie Server, MD;  Location: [facility];  Service: Orthopedics;  Laterality: Right;  PRE/POST OP SCALENE   Social History   Occupational History  . Occupation: Retired- Research officer, trade union  Tobacco Use  . Smoking status: Former Smoker    Packs/day: 3.00    Years: 5.00    Pack years: 15.00    Types: Cigarettes    Start date: 06/20/1977    Quit date: 07/20/1982    Years since quitting: 36.7  . Smokeless tobacco: Never Used  . Tobacco comment: quit 1980's  Substance and Sexual Activity  . Alcohol use: No    Alcohol/week: 0.0 standard drinks  . Drug use: No  . Sexual activity: Yes    Birth control/protection: None

## 2019-04-17 DIAGNOSIS — M86272 Subacute osteomyelitis, left ankle and foot: Secondary | ICD-10-CM | POA: Diagnosis not present

## 2019-04-20 DIAGNOSIS — E1143 Type 2 diabetes mellitus with diabetic autonomic (poly)neuropathy: Secondary | ICD-10-CM | POA: Diagnosis not present

## 2019-04-20 DIAGNOSIS — K3184 Gastroparesis: Secondary | ICD-10-CM | POA: Diagnosis not present

## 2019-04-20 DIAGNOSIS — E782 Mixed hyperlipidemia: Secondary | ICD-10-CM | POA: Diagnosis not present

## 2019-04-20 DIAGNOSIS — Z4781 Encounter for orthopedic aftercare following surgical amputation: Secondary | ICD-10-CM | POA: Diagnosis not present

## 2019-04-20 DIAGNOSIS — I209 Angina pectoris, unspecified: Secondary | ICD-10-CM | POA: Diagnosis not present

## 2019-04-20 DIAGNOSIS — Z89512 Acquired absence of left leg below knee: Secondary | ICD-10-CM | POA: Diagnosis not present

## 2019-04-20 DIAGNOSIS — E1122 Type 2 diabetes mellitus with diabetic chronic kidney disease: Secondary | ICD-10-CM | POA: Diagnosis not present

## 2019-04-20 DIAGNOSIS — E1151 Type 2 diabetes mellitus with diabetic peripheral angiopathy without gangrene: Secondary | ICD-10-CM | POA: Diagnosis not present

## 2019-04-20 DIAGNOSIS — I129 Hypertensive chronic kidney disease with stage 1 through stage 4 chronic kidney disease, or unspecified chronic kidney disease: Secondary | ICD-10-CM | POA: Diagnosis not present

## 2019-04-20 DIAGNOSIS — N183 Chronic kidney disease, stage 3 unspecified: Secondary | ICD-10-CM | POA: Diagnosis not present

## 2019-04-21 ENCOUNTER — Telehealth: Payer: Self-pay | Admitting: Physician Assistant

## 2019-04-21 DIAGNOSIS — K3184 Gastroparesis: Secondary | ICD-10-CM | POA: Diagnosis not present

## 2019-04-21 DIAGNOSIS — Z4781 Encounter for orthopedic aftercare following surgical amputation: Secondary | ICD-10-CM | POA: Diagnosis not present

## 2019-04-21 DIAGNOSIS — E1122 Type 2 diabetes mellitus with diabetic chronic kidney disease: Secondary | ICD-10-CM | POA: Diagnosis not present

## 2019-04-21 DIAGNOSIS — N183 Chronic kidney disease, stage 3 unspecified: Secondary | ICD-10-CM | POA: Diagnosis not present

## 2019-04-21 DIAGNOSIS — E1143 Type 2 diabetes mellitus with diabetic autonomic (poly)neuropathy: Secondary | ICD-10-CM | POA: Diagnosis not present

## 2019-04-21 DIAGNOSIS — Z89512 Acquired absence of left leg below knee: Secondary | ICD-10-CM | POA: Diagnosis not present

## 2019-04-21 DIAGNOSIS — E1151 Type 2 diabetes mellitus with diabetic peripheral angiopathy without gangrene: Secondary | ICD-10-CM | POA: Diagnosis not present

## 2019-04-21 DIAGNOSIS — I129 Hypertensive chronic kidney disease with stage 1 through stage 4 chronic kidney disease, or unspecified chronic kidney disease: Secondary | ICD-10-CM | POA: Diagnosis not present

## 2019-04-21 DIAGNOSIS — E782 Mixed hyperlipidemia: Secondary | ICD-10-CM | POA: Diagnosis not present

## 2019-04-21 DIAGNOSIS — I209 Angina pectoris, unspecified: Secondary | ICD-10-CM | POA: Diagnosis not present

## 2019-04-21 NOTE — Telephone Encounter (Signed)
I called and lm on vm for HHPT to give verbal ok for orders below. To call with questions.

## 2019-04-21 NOTE — Telephone Encounter (Signed)
[facility] called.   Requesting verbal orders for PT 1wk1 2wk3  She also asked that whoever calls to approve the orders leave a voicemail with their first name, last name, and title in the event that she doesn't answer.   Call back: [phone]

## 2019-04-25 ENCOUNTER — Telehealth: Payer: Self-pay | Admitting: Radiology

## 2019-04-25 DIAGNOSIS — I209 Angina pectoris, unspecified: Secondary | ICD-10-CM | POA: Diagnosis not present

## 2019-04-25 DIAGNOSIS — Z89512 Acquired absence of left leg below knee: Secondary | ICD-10-CM | POA: Diagnosis not present

## 2019-04-25 DIAGNOSIS — Z4781 Encounter for orthopedic aftercare following surgical amputation: Secondary | ICD-10-CM | POA: Diagnosis not present

## 2019-04-25 DIAGNOSIS — E1151 Type 2 diabetes mellitus with diabetic peripheral angiopathy without gangrene: Secondary | ICD-10-CM | POA: Diagnosis not present

## 2019-04-25 DIAGNOSIS — E1143 Type 2 diabetes mellitus with diabetic autonomic (poly)neuropathy: Secondary | ICD-10-CM | POA: Diagnosis not present

## 2019-04-25 DIAGNOSIS — E782 Mixed hyperlipidemia: Secondary | ICD-10-CM | POA: Diagnosis not present

## 2019-04-25 DIAGNOSIS — K3184 Gastroparesis: Secondary | ICD-10-CM | POA: Diagnosis not present

## 2019-04-25 DIAGNOSIS — N183 Chronic kidney disease, stage 3 unspecified: Secondary | ICD-10-CM | POA: Diagnosis not present

## 2019-04-25 DIAGNOSIS — E1122 Type 2 diabetes mellitus with diabetic chronic kidney disease: Secondary | ICD-10-CM | POA: Diagnosis not present

## 2019-04-25 DIAGNOSIS — I129 Hypertensive chronic kidney disease with stage 1 through stage 4 chronic kidney disease, or unspecified chronic kidney disease: Secondary | ICD-10-CM | POA: Diagnosis not present

## 2019-04-25 NOTE — Telephone Encounter (Signed)
Debbie with [facility] states she is seeing patient for gait training. She states that patient has scant bloody drainage, which he has had all along. She does not feel that this is getting worse, however, it is getting on his liner and they would like to know if he can use a bandaid. Advised ok to apply bandaid.

## 2019-04-27 DIAGNOSIS — E782 Mixed hyperlipidemia: Secondary | ICD-10-CM | POA: Diagnosis not present

## 2019-04-27 DIAGNOSIS — Z23 Encounter for immunization: Secondary | ICD-10-CM | POA: Diagnosis not present

## 2019-04-27 DIAGNOSIS — I209 Angina pectoris, unspecified: Secondary | ICD-10-CM | POA: Diagnosis not present

## 2019-04-27 DIAGNOSIS — N183 Chronic kidney disease, stage 3 unspecified: Secondary | ICD-10-CM | POA: Diagnosis not present

## 2019-04-27 DIAGNOSIS — K3184 Gastroparesis: Secondary | ICD-10-CM | POA: Diagnosis not present

## 2019-04-27 DIAGNOSIS — E1151 Type 2 diabetes mellitus with diabetic peripheral angiopathy without gangrene: Secondary | ICD-10-CM | POA: Diagnosis not present

## 2019-04-27 DIAGNOSIS — Z89512 Acquired absence of left leg below knee: Secondary | ICD-10-CM | POA: Diagnosis not present

## 2019-04-27 DIAGNOSIS — I129 Hypertensive chronic kidney disease with stage 1 through stage 4 chronic kidney disease, or unspecified chronic kidney disease: Secondary | ICD-10-CM | POA: Diagnosis not present

## 2019-04-27 DIAGNOSIS — E1143 Type 2 diabetes mellitus with diabetic autonomic (poly)neuropathy: Secondary | ICD-10-CM | POA: Diagnosis not present

## 2019-04-27 DIAGNOSIS — E1122 Type 2 diabetes mellitus with diabetic chronic kidney disease: Secondary | ICD-10-CM | POA: Diagnosis not present

## 2019-04-27 DIAGNOSIS — Z4781 Encounter for orthopedic aftercare following surgical amputation: Secondary | ICD-10-CM | POA: Diagnosis not present

## 2019-05-05 ENCOUNTER — Telehealth: Payer: Self-pay | Admitting: Orthopedic Surgery

## 2019-05-05 ENCOUNTER — Encounter: Payer: Self-pay | Admitting: Orthopedic Surgery

## 2019-05-05 ENCOUNTER — Ambulatory Visit (INDEPENDENT_AMBULATORY_CARE_PROVIDER_SITE_OTHER): Payer: Medicare HMO | Admitting: Orthopedic Surgery

## 2019-05-05 ENCOUNTER — Other Ambulatory Visit: Payer: Self-pay

## 2019-05-05 VITALS — Ht 77.0 in | Wt 265.0 lb

## 2019-05-05 DIAGNOSIS — S88112A Complete traumatic amputation at level between knee and ankle, left lower leg, initial encounter: Secondary | ICD-10-CM

## 2019-05-05 NOTE — Telephone Encounter (Signed)
I called Debbie and advised. 

## 2019-05-05 NOTE — Telephone Encounter (Signed)
Please advise on any restrictions for patient.

## 2019-05-05 NOTE — Telephone Encounter (Signed)
Debbie from [facility] called to inform Dr. Sharol Given patient missed [place] PT on today's date 05/05/2019. Debbie asked for a call back about any restrictions or precautions. [facility] phone number is [phone]

## 2019-05-05 NOTE — Telephone Encounter (Signed)
Patie states that he is ambulating well with his prosthetic leg and is not interested in therapy at this time.

## 2019-05-09 ENCOUNTER — Encounter: Payer: Self-pay | Admitting: Orthopedic Surgery

## 2019-05-09 DIAGNOSIS — Z1339 Encounter for screening examination for other mental health and behavioral disorders: Secondary | ICD-10-CM | POA: Diagnosis not present

## 2019-05-09 DIAGNOSIS — Z6832 Body mass index (BMI) 32.0-32.9, adult: Secondary | ICD-10-CM | POA: Diagnosis not present

## 2019-05-09 DIAGNOSIS — Z7189 Other specified counseling: Secondary | ICD-10-CM | POA: Diagnosis not present

## 2019-05-09 DIAGNOSIS — E785 Hyperlipidemia, unspecified: Secondary | ICD-10-CM | POA: Diagnosis not present

## 2019-05-09 DIAGNOSIS — Z1331 Encounter for screening for depression: Secondary | ICD-10-CM | POA: Diagnosis not present

## 2019-05-09 DIAGNOSIS — Z125 Encounter for screening for malignant neoplasm of prostate: Secondary | ICD-10-CM | POA: Diagnosis not present

## 2019-05-09 DIAGNOSIS — Z6828 Body mass index (BMI) 28.0-28.9, adult: Secondary | ICD-10-CM | POA: Diagnosis not present

## 2019-05-09 DIAGNOSIS — E1142 Type 2 diabetes mellitus with diabetic polyneuropathy: Secondary | ICD-10-CM | POA: Diagnosis not present

## 2019-05-09 DIAGNOSIS — Z79899 Other long term (current) drug therapy: Secondary | ICD-10-CM | POA: Diagnosis not present

## 2019-05-09 DIAGNOSIS — E1165 Type 2 diabetes mellitus with hyperglycemia: Secondary | ICD-10-CM | POA: Diagnosis not present

## 2019-05-09 DIAGNOSIS — Z0000 Encounter for general adult medical examination without abnormal findings: Secondary | ICD-10-CM | POA: Diagnosis not present

## 2019-05-09 DIAGNOSIS — Z299 Encounter for prophylactic measures, unspecified: Secondary | ICD-10-CM | POA: Diagnosis not present

## 2019-05-09 NOTE — Progress Notes (Signed)
Office Visit Note   Patient: [name]           Date of Birth: 07-04-1960           MRN: [id number] Visit Date: 05/05/2019              Requested by: Arsenio Katz, NP [place],  [place] [id number] PCP: Arsenio Katz, NP  No chief complaint on file.     HPI: Patient is a 59 year old gentleman who is just over 3 months out from left transtibial amputation.  He is currently ambulating with his prosthesis.  He states he did not do therapy.  Assessment & Plan: Visit Diagnoses:  1. Below-knee amputation of left lower extremity ([place])     Plan: Patient will continue to increase his activities as tolerated he states he is not interested in physical therapy.  Follow-Up Instructions: Return if symptoms worsen or fail to improve.   Ortho Exam  Patient is alert, oriented, no adenopathy, well-dressed, normal affect, normal respiratory effort. Examination patient has full extension of the knee there is no redness no cellulitis no open wounds the residual limb had consolidated nicely.  Imaging: No results found. No images are attached to the encounter.  Labs: Lab Results  Component Value Date   HGBA1C 8.9 (H) 01/12/2019   HGBA1C 8.4 (H) 03/15/2017   HGBA1C 8.9 (H) 12/24/2016   REPTSTATUS 06/07/2013 FINAL 06/02/2013   REPTSTATUS 06/04/2013 FINAL 06/02/2013   GRAMSTAIN  06/02/2013    RARE WBC PRESENT,BOTH PMN AND MONONUCLEAR NO SQUAMOUS EPITHELIAL CELLS SEEN NO ORGANISMS SEEN Performed at [facility]  06/02/2013    RARE WBC PRESENT,BOTH PMN AND MONONUCLEAR NO SQUAMOUS EPITHELIAL CELLS SEEN NO ORGANISMS SEEN Performed at [facility]   CULT  06/02/2013    NO ANAEROBES ISOLATED Performed at [facility]  06/02/2013    NO GROWTH 2 DAYS Performed at [facility]     Lab Results  Component Value Date   ALBUMIN 4.0 04/06/2018   ALBUMIN 4.3 10/30/2017   ALBUMIN 4.3 03/13/2017    No results found for:  MG No results found for: VD25OH  No results found for: PREALBUMIN CBC EXTENDED Latest Ref Rng & Units 01/12/2019 12/22/2018 10/04/2018  WBC 4.0 - 10.5 K/uL 6.6 6.7 -  RBC 4.22 - 5.81 MIL/uL 4.24 4.80 -  HGB 13.0 - 17.0 g/dL 10.8(L) 12.7(L) 13.6  HCT 39.0 - 52.0 % 34.7(L) 42.3 40.0  PLT 150 - 400 K/uL 349 236 -  NEUTROABS 1.7 - 7.7 K/uL - - -  LYMPHSABS 0.7 - 4.0 K/uL - - -     Body mass index is 31.42 kg/m.  Orders:  No orders of the defined types were placed in this encounter.  No orders of the defined types were placed in this encounter.    Procedures: No procedures performed  Clinical Data: No additional findings.  ROS:  All other systems negative, except as noted in the HPI. Review of Systems  Objective: Vital Signs: Ht 6\' 5"  (1.956 m)   Wt 265 lb (120.2 kg)   BMI 31.42 kg/m   Specialty Comments:  No specialty comments available.  PMFS History: Patient Active Problem List   Diagnosis Date Noted  . Gangrene of left foot ([place]) 01/12/2019  . Subacute osteomyelitis, left ankle and foot ([place])   . Acute osteomyelitis of left calcaneus (HCC)   . Ulcer of left heel and midfoot with necrosis of  bone ([place])   . Gastroesophageal reflux disease without esophagitis 11/09/2017  . Abdominal pain, chronic, epigastric 11/09/2017  . Numbness   . Syncope 03/13/2017  . H/O cervical spine surgery 03/13/2017  . Cervical spondylosis with radiculopathy 12/29/2016  . CVA (cerebral vascular accident) ([place]) 08/30/2014  . Diabetes with skin complication ([place]) 09/32/6712  . Neuropathy 08/30/2014  . AKI (acute kidney injury) ([place]) 08/30/2014  . Bilateral occipital neuralgia 10/30/2013  . Lumbar stenosis without neurogenic claudication 01/12/2013  . Type 2 diabetes mellitus with vascular disease ([place])   . Hypertension   . Hyperlipidemia   . Gastroparesis   . Pulmonary embolus ([place]) 2012   . Obesity   . GERD 01/14/2010  . Chest pain 10/05/2003   Past Medical History:   Diagnosis Date  . Allergic rhinitis, cause unspecified   . Anginal pain ([place])   . Arthritis   . BPH (benign prostatic hyperplasia)   . Cervicalgia   . Chest pain Sept. 2005   multiple caths  /  cath 06/15/2010..normal coronaries,  EF 60%   (false postive nuclear in the past)  . CKD (chronic kidney disease), stage III   . Complication of anesthesia    Hard time waking up patient stated low blood pressures around 2017  . Dyslipidemia    mixed  . Esophageal reflux   . Fibromyalgia   . Gastroparesis   . Headache(784.0)   . History of kidney stones   . Hyperlipidemia   . Hypertension   . Hypoplasia of one kidney   . IDDM (insulin dependent diabetes mellitus)   . Lumbago   . Neuropathy associated with endocrine disorder ([place])   . Overweight(278.02)   . Peripheral vascular disease ([place])   . Pulmonary embolus (HCC)    Hx of small left lower lobe  pulmonary embolus  . Pulmonary embolus ([place]) 2010ish  . Stroke [facility])    mini stroke aug 2016  . Type II or unspecified type diabetes mellitus with neurological manifestations, not stated as uncontrolled(250.60)   . Urticaria, unspecified   . Wears glasses     Family History  Problem Relation Age of Onset  . Heart attack Father 36  . Hyperlipidemia Father   . Hypertension Father   . Heart attack Mother 4  . Diabetes Mother   . Hypertension Mother   . Hyperlipidemia Mother   . Healthy Brother   . Seizures Brother   . Migraines Neg Hx     Past Surgical History:  Procedure Laterality Date  . ACHILLES TENDON REPAIR Left 2013  . AMPUTATION Left 06/02/2013   Procedure: AMPUTATION 1ST TOE LEFT FOOT;  Surgeon: Marcheta Grammes, DPM;  [facility];  Service: Orthopedics;  Laterality: Left;  . AMPUTATION Left 07/21/2013   Procedure: PARTIAL AMPUTATION 2ND TOE LEFT FOOT;  Surgeon: Marcheta Grammes, DPM;  [facility];  Service: Podiatry;  Laterality: Left;  . AMPUTATION Left 01/12/2019   Procedure: LEFT BELOW KNEE  AMPUTATION;  Surgeon: Newt Minion, MD;  Location: [facility];  Service: Orthopedics;  Laterality: Left;  . ANTERIOR CERVICAL DECOMP/DISCECTOMY FUSION N/A 12/29/2016   Procedure: Cervical five-six Cervical six-seven Anterior cervical discectomy with fusion and plate fixation;  Surgeon: Ditty, Kevan Ny, MD;  Location: [facility];  Service: Neurosurgery;  Laterality: N/A;  . APPENDECTOMY    . BACK SURGERY  1999   x 6 some fusions  . BIOPSY  12/18/2017   Procedure: BIOPSY;  Surgeon: Rogene Houston, MD;  [facility];  Service: Endoscopy;;  gastric and esophagus  . colonscopy    . ESOPHAGOGASTRODUODENOSCOPY (EGD) WITH PROPOFOL N/A 12/18/2017   Procedure: ESOPHAGOGASTRODUODENOSCOPY (EGD) WITH PROPOFOL;  Surgeon: Rogene Houston, MD;  [facility];  Service: Endoscopy;  Laterality: N/A;  9:25  . FOOT ARTHRODESIS Right 01/11/2014   Procedure: ARTHRODESIS INTERPHALANGEAL JOINT HALLUX RIGHT FOOT;  Surgeon: Marcheta Grammes, DPM;  [facility];  Service: Podiatry;  Laterality: Right;  . HARDWARE REMOVAL Right 01/17/2015   Procedure: HARDWARE REMOVAL;  Surgeon: Caprice Beaver, DPM;  [facility];  Service: Podiatry;  Laterality: Right;  . I & D EXTREMITY Left 12/22/2018   Procedure: LEFT PARTIAL CALCANEAL EXCISION;  Surgeon: Newt Minion, MD;  Location: [facility];  Service: Orthopedics;  Laterality: Left;  . KNEE ARTHROSCOPY Right 10/2015  . LUMBAR LAMINECTOMY/DECOMPRESSION MICRODISCECTOMY Right 01/12/2013   Procedure: Right Lumbar Three-Four Laminotomy/Foraminotomy;  Surgeon: Floyce Stakes, MD;  [facility];  Service: Neurosurgery;  Laterality: Right;  Right Lumbar Three-Four Laminotomy/Foraminotomy  . NISSEN FUNDOPLICATION    . SHOULDER ARTHROSCOPY W/ ROTATOR CUFF REPAIR Right   . SHOULDER ARTHROSCOPY WITH DISTAL CLAVICLE RESECTION Right 10/04/2018   Procedure: RIGHT SHOULDER ARTHROSCOPY WITH DISTAL CLAVICLE RESECTION, EXTENSIVE DEBRIDEMENT, SUBACROMIAL  PARTIAL ACROMIOPLASTY,;  Surgeon: Earlie Server, MD;  Location: [facility];  Service: Orthopedics;  Laterality: Right;  PRE/POST OP SCALENE   Social History   Occupational History  . Occupation: Retired- Research officer, trade union  Tobacco Use  . Smoking status: Former Smoker    Packs/day: 3.00    Years: 5.00    Pack years: 15.00    Types: Cigarettes    Start date: 06/20/1977    Quit date: 07/20/1982    Years since quitting: 36.8  . Smokeless tobacco: Never Used  . Tobacco comment: quit 1980's  Substance and Sexual Activity  . Alcohol use: No    Alcohol/week: 0.0 standard drinks  . Drug use: No  . Sexual activity: Yes    Birth control/protection: None

## 2019-05-12 DIAGNOSIS — R972 Elevated prostate specific antigen [PSA]: Secondary | ICD-10-CM | POA: Diagnosis not present

## 2019-05-12 DIAGNOSIS — Z6832 Body mass index (BMI) 32.0-32.9, adult: Secondary | ICD-10-CM | POA: Diagnosis not present

## 2019-05-12 DIAGNOSIS — Z794 Long term (current) use of insulin: Secondary | ICD-10-CM | POA: Diagnosis not present

## 2019-05-12 DIAGNOSIS — Z299 Encounter for prophylactic measures, unspecified: Secondary | ICD-10-CM | POA: Diagnosis not present

## 2019-05-12 DIAGNOSIS — E1122 Type 2 diabetes mellitus with diabetic chronic kidney disease: Secondary | ICD-10-CM | POA: Diagnosis not present

## 2019-05-12 DIAGNOSIS — N183 Chronic kidney disease, stage 3 unspecified: Secondary | ICD-10-CM | POA: Diagnosis not present

## 2019-05-13 ENCOUNTER — Other Ambulatory Visit: Payer: Self-pay | Admitting: Cardiovascular Disease

## 2019-05-18 DIAGNOSIS — M86272 Subacute osteomyelitis, left ankle and foot: Secondary | ICD-10-CM | POA: Diagnosis not present

## 2019-05-25 DIAGNOSIS — Z23 Encounter for immunization: Secondary | ICD-10-CM | POA: Diagnosis not present

## 2019-05-31 ENCOUNTER — Other Ambulatory Visit: Payer: Self-pay | Admitting: Cardiovascular Disease

## 2019-05-31 MED ORDER — RANOLAZINE ER 1000 MG PO TB12: 1000.0000 mg | ORAL_TABLET | Freq: Every day | ORAL | 0 refills | Status: DC

## 2019-05-31 NOTE — Telephone Encounter (Signed)
Pt was sent 7 day pill with notes for pt to make appt 05/11/2019 (past due for appt) appt made for tomorrow with NP - will send #30 supply

## 2019-05-31 NOTE — Telephone Encounter (Signed)
*  STAT* If patient is at the pharmacy, call can be transferred to refill team.   1. Which medications need to be refilled? Ranexa   2. Which pharmacy/location (including street and city if local pharmacy) is medication to be sent to? [facility]  3. Do they need a 30 day or 90 day supply?  Has been out of medication for days

## 2019-05-31 NOTE — Progress Notes (Addendum)
Cardiology Office Note  Date: 06/01/2019   ID: [name] 1960/08/05, MRN [id number]  PCP:  Dustin Katz, NP  Cardiologist:  Dustin Sable, MD Electrophysiologist:  None   Chief Complaint: Follow-up history of microvascular angina.HTN, TIA, HLD, IDDM, CKD III-IV( Recent Crt 2.11, GFR 33)  History of Present Illness: [name] is a 59 y.o. 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  Cardiac catheterizations in 2005 and 2012 normal coronaries.  History of TIA, HTN, HLD, IDDM.  Last saw Dr. Bronson [name] 04/02/2018.  Denied any symptoms of chest pain palpitations, shortness of breath, lightheadedness dizziness, swelling, orthopnea, PND, or syncope.  His primary complaints were related to neuropathy in bilateral hands and feet.  Patient was continuing his Ranexa for presumed microvascular angina, blood pressure was controlled on current therapy.  He continued his atorvastatin for hyperlipidemia.     He presents today for with no particular complaints.  States he just need refills and has been a while since he has been here.  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  He has a left leg prosthesis.  He denies any recent progressive anginal or exertional symptoms, palpitations or arrhythmias, orthostatic symptoms, stroke or TIA-like symptoms.  He follows with Dr. Theador [name] nephrology in [place].  States he was recently diagnosed with prostatitis and is taking antibiotics currently.  He denies any issues in his right leg.  No open sores or wounds.  States he keeps a close eye on this leg to make sure there are no issues.  States he has periodic lab work from both primary care provider and nephrologist.  Recently had labs at PCP [facility] office.   Past Medical History:  Diagnosis Date  . Allergic  rhinitis, cause unspecified   . Anginal pain ([place])   . Arthritis   . BPH (benign prostatic hyperplasia)   . Cervicalgia   . Chest pain Sept. 2005   multiple caths  /  cath 06/15/2010..normal coronaries,  EF 60%   (false postive nuclear in the past)  . CKD (chronic kidney disease), stage III   . Complication of anesthesia    Hard time waking up patient stated low blood pressures around 2017  . Dyslipidemia    mixed  . Esophageal reflux   . Fibromyalgia   . Gastroparesis   . Headache(784.0)   . History of kidney stones   . Hyperlipidemia   . Hypertension   . Hypoplasia of one kidney   . IDDM (insulin dependent diabetes mellitus)   . Lumbago   . Neuropathy associated with endocrine disorder ([place])   . Overweight(278.02)   . Peripheral vascular disease ([place])   . Pulmonary embolus (HCC)    Hx of small left lower lobe  pulmonary embolus  . Pulmonary embolus ([place]) 2010ish  . Stroke [facility])    mini stroke aug 2016  . Type II or unspecified type diabetes mellitus with neurological manifestations, not stated as uncontrolled(250.60)   . Urticaria, unspecified   . Wears glasses     Past Surgical History:  Procedure Laterality Date  . ACHILLES TENDON REPAIR Left 2013  . AMPUTATION Left 06/02/2013   Procedure: AMPUTATION 1ST TOE LEFT FOOT;  Surgeon: [name], DPM;  [facility];  Service: Orthopedics;  Laterality: Left;  . AMPUTATION Left 07/21/2013   Procedure:  PARTIAL AMPUTATION 2ND TOE LEFT FOOT;  Surgeon: [name], DPM;  [facility];  Service: Podiatry;  Laterality: Left;  . AMPUTATION Left 01/12/2019   Procedure: LEFT BELOW KNEE AMPUTATION;  Surgeon: Dustin Minion, MD;  Location: [employer];  Service: Orthopedics;  Laterality: Left;  . ANTERIOR CERVICAL DECOMP/DISCECTOMY FUSION N/A 12/29/2016   Procedure: Cervical five-six Cervical six-seven Anterior cervical discectomy with fusion and plate fixation;  Surgeon: [name], Dustin Ny, MD;  Location: [facility] [place];   Service: Neurosurgery;  Laterality: N/A;  . APPENDECTOMY    . BACK SURGERY  1999   x 6 some fusions  . BELOW KNEE LEG AMPUTATION Left 01/2019  . BIOPSY  12/18/2017   Procedure: BIOPSY;  Surgeon: Dustin Houston, MD;  [facility];  Service: Endoscopy;;  gastric and esophagus  . colonscopy    . ESOPHAGOGASTRODUODENOSCOPY (EGD) WITH PROPOFOL N/A 12/18/2017   Procedure: ESOPHAGOGASTRODUODENOSCOPY (EGD) WITH PROPOFOL;  Surgeon: Dustin Houston, MD;  [facility];  Service: Endoscopy;  Laterality: N/A;  9:25  . FOOT ARTHRODESIS Right 01/11/2014   Procedure: ARTHRODESIS INTERPHALANGEAL JOINT HALLUX RIGHT FOOT;  Surgeon: [name], DPM;  [facility];  Service: Podiatry;  Laterality: Right;  . HARDWARE REMOVAL Right 01/17/2015   Procedure: HARDWARE REMOVAL;  Surgeon: [name], DPM;  [facility];  Service: Podiatry;  Laterality: Right;  . I & D EXTREMITY Left 12/22/2018   Procedure: LEFT PARTIAL CALCANEAL EXCISION;  Surgeon: Dustin Minion, MD;  Location: [facility];  Service: Orthopedics;  Laterality: Left;  . KNEE ARTHROSCOPY Right 10/2015  . LUMBAR LAMINECTOMY/DECOMPRESSION MICRODISCECTOMY Right 01/12/2013   Procedure: Right Lumbar Three-Four Laminotomy/Foraminotomy;  Surgeon: Dustin Stakes, MD;  [facility];  Service: Neurosurgery;  Laterality: Right;  Right Lumbar Three-Four Laminotomy/Foraminotomy  . NISSEN FUNDOPLICATION    . SHOULDER ARTHROSCOPY W/ ROTATOR CUFF REPAIR Right   . SHOULDER ARTHROSCOPY WITH DISTAL CLAVICLE RESECTION Right 10/04/2018   Procedure: RIGHT SHOULDER ARTHROSCOPY WITH DISTAL CLAVICLE RESECTION, EXTENSIVE DEBRIDEMENT, SUBACROMIAL PARTIAL ACROMIOPLASTY,;  Surgeon: Dustin Server, MD;  Location: [facility];  Service: Orthopedics;  Laterality: Right;  PRE/POST OP SCALENE    Current Outpatient Medications  Medication Sig Dispense Refill  . aspirin EC 81 MG tablet Take 81 mg by mouth daily.     [name] atorvastatin (LIPITOR) 40 MG tablet Take 1 tablet (40 mg total) by mouth daily. 30 tablet 0  . empagliflozin (JARDIANCE) 25 MG TABS tablet Take 25 mg by mouth daily.    [name] EPINEPHrine 0.3 mg/0.3 mL IJ SOAJ injection Inject 0.3 mg into the muscle as needed for anaphylaxis.    . ferrous sulfate 325 (65 FE) MG tablet Take 325 mg by mouth daily.    [name] gabapentin (NEURONTIN) 600 MG tablet 1 tab by mouth every morning & 3 tabs at bedtime    . glipiZIDE (GLUCOTROL) 10 MG tablet Take 10 mg by mouth daily.    . insulin detemir (LEVEMIR) 100 UNIT/ML injection Inject 67 Units into the skin at bedtime.    . methocarbamol (ROBAXIN) 500 MG tablet Take 1 tablet (500 mg total) by mouth every 6 (six) hours as needed for muscle spasms. 30 tablet 0  . omega-3 acid ethyl esters (LOVAZA) 1 g capsule Take 1 g by mouth daily.     . ranolazine (RANEXA) 1000 MG SR tablet Take 1 tablet (1,000 mg total) by mouth daily. 30 tablet 0  . sodium bicarbonate 650 MG tablet Take  650 mg by mouth 2 (two) times daily.      No current facility-administered medications for this visit.   Allergies:  Yellow jacket venom [bee venom] and Reglan [metoclopramide]   Social History: The patient  reports that he quit smoking about 36 years ago. His smoking use included cigarettes. He started smoking about 41 years ago. He has a 15.00 pack-year smoking history. He has never used smokeless tobacco. He reports that he does not drink alcohol or use drugs.   Family History: The patient's family history includes Diabetes in his mother; Healthy in his brother; Heart attack (age of onset: 58) in his father; Heart attack (age of onset: 33) in his mother; Hyperlipidemia in his father and mother; Hypertension in his father and mother; Seizures in his brother.   ROS:  Please see the history of present illness. Otherwise, complete review of systems is positive for none.  All other systems are reviewed and negative.   Physical Exam: VS:  BP 132/84    Pulse 74   Ht 6\' 5"  (1.956 m)   Wt 264 lb 12.8 oz (120.1 kg)   SpO2 97%   BMI 31.40 kg/m , BMI Body mass index is 31.4 kg/m.  Wt Readings from Last 3 Encounters:  06/01/19 264 lb 12.8 oz (120.1 kg)  05/05/19 265 lb (120.2 kg)  04/04/19 265 lb (120.2 kg)    General: Patient appears comfortable at rest. Neck: Supple, no elevated JVP or carotid bruits, no thyromegaly. Lungs: Clear to auscultation, nonlabored breathing at rest. Cardiac: Regular rate and rhythm, no S3 or significant systolic murmur, no pericardial rub. Extremities: No pitting edema, distal pulses 1+ on right.  Recent BKA to left leg secondary to severe osteomyelitis from nonhealing diabetic ulcer. Has a prosthetic leg. Skin: Warm and dry. Musculoskeletal: No kyphosis. Neuropsychiatric: Alert and oriented x3, affect grossly appropriate.  ECG:  An ECG dated 04/06/2018 was personally reviewed today and demonstrated:  Sinus tachycardia rate of 105  Recent Labwork: 01/12/2019: BUN 31; Creatinine, Ser 2.03; Hemoglobin 10.8; Platelets 349; Potassium 4.1; Sodium 139     Component Value Date/Time   CHOL 151 03/14/2017 1658   TRIG 111 03/14/2017 1658   HDL 33 (L) 03/14/2017 1658   CHOLHDL 4.6 03/14/2017 1658   VLDL 22 03/14/2017 1658   LDLCALC 96 03/14/2017 1658    Other Studies Reviewed Today:  Echocardiogram: 03/2017 Study Conclusions  - Left ventricle: Inferobasal hypokinesis The cavity size was normal. There was mild focal basal hypertrophy of the septum. Systolic function was normal. The estimated ejection fraction was in the range of 55% to 60%. Wall motion was normal; there were no regional wall motion abnormalities. Doppler parameters are consistent with abnormal left ventricular relaxation (grade 1 diastolic dysfunction). - Mitral valve: Valve area by pressure half-time: 1.55 cm^2. - Left atrium: The atrium was mildly dilated.  Cardiac Catheterization: 06/2010 ANGIOGRAPHIC DATA: The right  coronary artery arises somewhat anteriorly. It is a small nondominant vessel and is normal.  The left main coronary artery is normal.  The left anterior descending artery is a large vessel and is normal.  There is a moderate ramus intermediate branch which is normal.  Left circumflex coronary artery is a dominant vessel. There is minor narrowing of the third obtuse marginal vessel up to 40%. Otherwise, the left circumflex system appears normal.  Left ventricular angiography performed in RAO view demonstrates normal left ventricular size and contractility with ejection fraction estimated at 60%.  FINAL INTERPRETATION: 1. No significant  obstructive atherosclerotic coronary artery disease. 2. Normal left ventricular function  Assessment and Plan:  1. Chest pain, unspecified type   2. Essential hypertension   3. Hyperlipidemia LDL goal <70   4. Type 2 diabetes mellitus with vascular disease ([place])     1. Chest pain, unspecified type He denies any recent progressive anginal or exertional symptoms.  Continue aspirin 81 mg, ranolazine 1000 mg SR daily.  Please refill both ranolazine and aspirin at current dosages.  2. Essential hypertension Blood pressure is reasonably controlled with a blood pressure of 132/84 today.  Not on antihypertensive medications at this time.  3. Hyperlipidemia LDL goal <70 No recent lipid panels from PCP office.  We will attempt to obtain those results continue atorvastatin 40 mg daily.  Please refill atorvastatin 40 mg . Recent lipid panel from PCP office collected on 05/09/2019 showed total cholesterol 167, triglycerides 237, HDL 31, LDL 96  4. Type 2 diabetes mellitus with vascular disease ([place]) Managed by PCP.  Patient is currently taking glipizide 10 mg daily, insulin detemir 67 units at bedtime, Jardiance 25 mg daily.    Medication Adjustments/Labs and Tests Ordered: Current medicines are reviewed at length with the patient today.  Concerns  regarding medicines are outlined above.   Disposition: Follow-up with Dr. Bronson [name] or APP 1 year  Signed, Levell July, NP 06/01/2019 10:53 AM    [facility] at [place], [place], [place] [address] Phone: [phone]; Fax: [phone]

## 2019-06-01 ENCOUNTER — Ambulatory Visit (INDEPENDENT_AMBULATORY_CARE_PROVIDER_SITE_OTHER): Payer: Medicare HMO | Admitting: Family Medicine

## 2019-06-01 ENCOUNTER — Encounter: Payer: Self-pay | Admitting: Family Medicine

## 2019-06-01 ENCOUNTER — Other Ambulatory Visit: Payer: Self-pay

## 2019-06-01 ENCOUNTER — Encounter: Payer: Self-pay | Admitting: *Deleted

## 2019-06-01 VITALS — BP 132/84 | HR 74 | Ht 77.0 in | Wt 264.8 lb

## 2019-06-01 DIAGNOSIS — I10 Essential (primary) hypertension: Secondary | ICD-10-CM

## 2019-06-01 DIAGNOSIS — R079 Chest pain, unspecified: Secondary | ICD-10-CM | POA: Diagnosis not present

## 2019-06-01 DIAGNOSIS — E1159 Type 2 diabetes mellitus with other circulatory complications: Secondary | ICD-10-CM

## 2019-06-01 DIAGNOSIS — E785 Hyperlipidemia, unspecified: Secondary | ICD-10-CM | POA: Diagnosis not present

## 2019-06-01 MED ORDER — RANOLAZINE ER 1000 MG PO TB12: 1000.0000 mg | ORAL_TABLET | Freq: Every day | ORAL | 11 refills | Status: DC

## 2019-06-01 MED ORDER — ATORVASTATIN CALCIUM 40 MG PO TABS: 40.0000 mg | ORAL_TABLET | Freq: Every day | ORAL | 11 refills | Status: DC

## 2019-06-01 NOTE — Patient Instructions (Addendum)

## 2019-06-10 DIAGNOSIS — E872 Acidosis: Secondary | ICD-10-CM | POA: Diagnosis not present

## 2019-06-10 DIAGNOSIS — E1122 Type 2 diabetes mellitus with diabetic chronic kidney disease: Secondary | ICD-10-CM | POA: Diagnosis not present

## 2019-06-10 DIAGNOSIS — E559 Vitamin D deficiency, unspecified: Secondary | ICD-10-CM | POA: Diagnosis not present

## 2019-06-10 DIAGNOSIS — I129 Hypertensive chronic kidney disease with stage 1 through stage 4 chronic kidney disease, or unspecified chronic kidney disease: Secondary | ICD-10-CM | POA: Diagnosis not present

## 2019-06-10 DIAGNOSIS — N189 Chronic kidney disease, unspecified: Secondary | ICD-10-CM | POA: Diagnosis not present

## 2019-06-10 DIAGNOSIS — Z79899 Other long term (current) drug therapy: Secondary | ICD-10-CM | POA: Diagnosis not present

## 2019-06-16 DIAGNOSIS — Z794 Long term (current) use of insulin: Secondary | ICD-10-CM | POA: Diagnosis not present

## 2019-06-16 DIAGNOSIS — E1142 Type 2 diabetes mellitus with diabetic polyneuropathy: Secondary | ICD-10-CM | POA: Diagnosis not present

## 2019-06-16 DIAGNOSIS — E1165 Type 2 diabetes mellitus with hyperglycemia: Secondary | ICD-10-CM | POA: Diagnosis not present

## 2019-06-16 DIAGNOSIS — Z299 Encounter for prophylactic measures, unspecified: Secondary | ICD-10-CM | POA: Diagnosis not present

## 2019-06-16 DIAGNOSIS — I739 Peripheral vascular disease, unspecified: Secondary | ICD-10-CM | POA: Diagnosis not present

## 2019-06-17 DIAGNOSIS — M86272 Subacute osteomyelitis, left ankle and foot: Secondary | ICD-10-CM | POA: Diagnosis not present

## 2019-06-20 DIAGNOSIS — Z1211 Encounter for screening for malignant neoplasm of colon: Secondary | ICD-10-CM | POA: Diagnosis not present

## 2019-06-24 ENCOUNTER — Other Ambulatory Visit (HOSPITAL_COMMUNITY): Payer: Self-pay | Admitting: Nephrology

## 2019-06-24 ENCOUNTER — Other Ambulatory Visit: Payer: Self-pay | Admitting: Nephrology

## 2019-06-24 DIAGNOSIS — E872 Acidosis: Secondary | ICD-10-CM | POA: Diagnosis not present

## 2019-06-24 DIAGNOSIS — R319 Hematuria, unspecified: Secondary | ICD-10-CM | POA: Diagnosis not present

## 2019-06-24 DIAGNOSIS — E871 Hypo-osmolality and hyponatremia: Secondary | ICD-10-CM | POA: Diagnosis not present

## 2019-06-24 DIAGNOSIS — N390 Urinary tract infection, site not specified: Secondary | ICD-10-CM | POA: Diagnosis not present

## 2019-06-24 DIAGNOSIS — E1122 Type 2 diabetes mellitus with diabetic chronic kidney disease: Secondary | ICD-10-CM | POA: Diagnosis not present

## 2019-06-24 DIAGNOSIS — N200 Calculus of kidney: Secondary | ICD-10-CM | POA: Diagnosis not present

## 2019-06-24 DIAGNOSIS — I129 Hypertensive chronic kidney disease with stage 1 through stage 4 chronic kidney disease, or unspecified chronic kidney disease: Secondary | ICD-10-CM | POA: Diagnosis not present

## 2019-06-24 DIAGNOSIS — N189 Chronic kidney disease, unspecified: Secondary | ICD-10-CM | POA: Diagnosis not present

## 2019-06-30 ENCOUNTER — Ambulatory Visit (HOSPITAL_COMMUNITY)
Admission: RE | Admit: 2019-06-30 | Discharge: 2019-06-30 | Disposition: A | Discharge status: Home / Self Care | Payer: Medicare HMO | Source: Ambulatory Visit | Attending: Nephrology | Admitting: Nephrology

## 2019-06-30 ENCOUNTER — Other Ambulatory Visit: Payer: Self-pay

## 2019-06-30 DIAGNOSIS — E871 Hypo-osmolality and hyponatremia: Secondary | ICD-10-CM

## 2019-06-30 DIAGNOSIS — R319 Hematuria, unspecified: Secondary | ICD-10-CM | POA: Diagnosis not present

## 2019-06-30 DIAGNOSIS — N200 Calculus of kidney: Secondary | ICD-10-CM | POA: Diagnosis not present

## 2019-07-07 ENCOUNTER — Ambulatory Visit (HOSPITAL_COMMUNITY): Payer: Medicare HMO

## 2019-07-15 DIAGNOSIS — E1122 Type 2 diabetes mellitus with diabetic chronic kidney disease: Secondary | ICD-10-CM | POA: Diagnosis not present

## 2019-07-15 DIAGNOSIS — E872 Acidosis: Secondary | ICD-10-CM | POA: Diagnosis not present

## 2019-07-15 DIAGNOSIS — N200 Calculus of kidney: Secondary | ICD-10-CM | POA: Diagnosis not present

## 2019-07-15 DIAGNOSIS — M545 Low back pain: Secondary | ICD-10-CM | POA: Diagnosis not present

## 2019-07-15 DIAGNOSIS — I129 Hypertensive chronic kidney disease with stage 1 through stage 4 chronic kidney disease, or unspecified chronic kidney disease: Secondary | ICD-10-CM | POA: Diagnosis not present

## 2019-07-15 DIAGNOSIS — N189 Chronic kidney disease, unspecified: Secondary | ICD-10-CM | POA: Diagnosis not present

## 2019-07-18 DIAGNOSIS — M86272 Subacute osteomyelitis, left ankle and foot: Secondary | ICD-10-CM | POA: Diagnosis not present

## 2019-08-17 DIAGNOSIS — M86272 Subacute osteomyelitis, left ankle and foot: Secondary | ICD-10-CM | POA: Diagnosis not present

## 2019-08-26 DIAGNOSIS — R809 Proteinuria, unspecified: Secondary | ICD-10-CM | POA: Diagnosis not present

## 2019-08-26 DIAGNOSIS — D649 Anemia, unspecified: Secondary | ICD-10-CM | POA: Diagnosis not present

## 2019-08-26 DIAGNOSIS — I10 Essential (primary) hypertension: Secondary | ICD-10-CM | POA: Diagnosis not present

## 2019-08-26 DIAGNOSIS — Z79899 Other long term (current) drug therapy: Secondary | ICD-10-CM | POA: Diagnosis not present

## 2019-08-31 DIAGNOSIS — E871 Hypo-osmolality and hyponatremia: Secondary | ICD-10-CM | POA: Diagnosis not present

## 2019-08-31 DIAGNOSIS — E559 Vitamin D deficiency, unspecified: Secondary | ICD-10-CM | POA: Diagnosis not present

## 2019-08-31 DIAGNOSIS — N189 Chronic kidney disease, unspecified: Secondary | ICD-10-CM | POA: Diagnosis not present

## 2019-08-31 DIAGNOSIS — E872 Acidosis: Secondary | ICD-10-CM | POA: Diagnosis not present

## 2019-08-31 DIAGNOSIS — E1122 Type 2 diabetes mellitus with diabetic chronic kidney disease: Secondary | ICD-10-CM | POA: Diagnosis not present

## 2019-08-31 DIAGNOSIS — I129 Hypertensive chronic kidney disease with stage 1 through stage 4 chronic kidney disease, or unspecified chronic kidney disease: Secondary | ICD-10-CM | POA: Diagnosis not present

## 2019-09-17 DIAGNOSIS — M86272 Subacute osteomyelitis, left ankle and foot: Secondary | ICD-10-CM | POA: Diagnosis not present

## 2019-10-18 DIAGNOSIS — M86272 Subacute osteomyelitis, left ankle and foot: Secondary | ICD-10-CM | POA: Diagnosis not present

## 2019-10-24 DIAGNOSIS — N186 End stage renal disease: Secondary | ICD-10-CM | POA: Diagnosis not present

## 2019-10-24 DIAGNOSIS — E872 Acidosis: Secondary | ICD-10-CM | POA: Diagnosis not present

## 2019-10-24 DIAGNOSIS — E871 Hypo-osmolality and hyponatremia: Secondary | ICD-10-CM | POA: Diagnosis not present

## 2019-10-24 DIAGNOSIS — N189 Chronic kidney disease, unspecified: Secondary | ICD-10-CM | POA: Diagnosis not present

## 2019-10-24 DIAGNOSIS — I129 Hypertensive chronic kidney disease with stage 1 through stage 4 chronic kidney disease, or unspecified chronic kidney disease: Secondary | ICD-10-CM | POA: Diagnosis not present

## 2019-10-24 DIAGNOSIS — E1122 Type 2 diabetes mellitus with diabetic chronic kidney disease: Secondary | ICD-10-CM | POA: Diagnosis not present

## 2019-10-25 DIAGNOSIS — M79605 Pain in left leg: Secondary | ICD-10-CM | POA: Diagnosis not present

## 2019-10-25 DIAGNOSIS — Z6833 Body mass index (BMI) 33.0-33.9, adult: Secondary | ICD-10-CM | POA: Diagnosis not present

## 2019-10-25 DIAGNOSIS — E1142 Type 2 diabetes mellitus with diabetic polyneuropathy: Secondary | ICD-10-CM | POA: Diagnosis not present

## 2019-10-25 DIAGNOSIS — Z299 Encounter for prophylactic measures, unspecified: Secondary | ICD-10-CM | POA: Diagnosis not present

## 2019-10-25 DIAGNOSIS — E1165 Type 2 diabetes mellitus with hyperglycemia: Secondary | ICD-10-CM | POA: Diagnosis not present

## 2019-10-25 DIAGNOSIS — M545 Low back pain: Secondary | ICD-10-CM | POA: Diagnosis not present

## 2019-11-04 DIAGNOSIS — N189 Chronic kidney disease, unspecified: Secondary | ICD-10-CM | POA: Diagnosis not present

## 2019-11-04 DIAGNOSIS — I129 Hypertensive chronic kidney disease with stage 1 through stage 4 chronic kidney disease, or unspecified chronic kidney disease: Secondary | ICD-10-CM | POA: Diagnosis not present

## 2019-11-04 DIAGNOSIS — E559 Vitamin D deficiency, unspecified: Secondary | ICD-10-CM | POA: Diagnosis not present

## 2019-11-04 DIAGNOSIS — E1122 Type 2 diabetes mellitus with diabetic chronic kidney disease: Secondary | ICD-10-CM | POA: Diagnosis not present

## 2019-11-04 DIAGNOSIS — E871 Hypo-osmolality and hyponatremia: Secondary | ICD-10-CM | POA: Diagnosis not present

## 2019-11-11 ENCOUNTER — Other Ambulatory Visit: Payer: Self-pay | Admitting: Orthopedic Surgery

## 2019-11-11 DIAGNOSIS — M5442 Lumbago with sciatica, left side: Secondary | ICD-10-CM | POA: Diagnosis not present

## 2019-12-03 ENCOUNTER — Other Ambulatory Visit: Payer: Self-pay

## 2019-12-03 ENCOUNTER — Ambulatory Visit
Admission: RE | Admit: 2019-12-03 | Discharge: 2019-12-03 | Disposition: A | Discharge status: Home / Self Care | Payer: Medicare HMO | Source: Ambulatory Visit | Attending: Orthopedic Surgery | Admitting: Orthopedic Surgery

## 2019-12-03 DIAGNOSIS — M545 Low back pain, unspecified: Secondary | ICD-10-CM | POA: Diagnosis not present

## 2019-12-03 DIAGNOSIS — M5442 Lumbago with sciatica, left side: Secondary | ICD-10-CM

## 2019-12-03 DIAGNOSIS — M48061 Spinal stenosis, lumbar region without neurogenic claudication: Secondary | ICD-10-CM | POA: Diagnosis not present

## 2019-12-09 DIAGNOSIS — M5442 Lumbago with sciatica, left side: Secondary | ICD-10-CM | POA: Diagnosis not present

## 2019-12-28 DIAGNOSIS — I129 Hypertensive chronic kidney disease with stage 1 through stage 4 chronic kidney disease, or unspecified chronic kidney disease: Secondary | ICD-10-CM | POA: Diagnosis not present

## 2019-12-28 DIAGNOSIS — N186 End stage renal disease: Secondary | ICD-10-CM | POA: Diagnosis not present

## 2019-12-28 DIAGNOSIS — N189 Chronic kidney disease, unspecified: Secondary | ICD-10-CM | POA: Diagnosis not present

## 2019-12-28 DIAGNOSIS — E1122 Type 2 diabetes mellitus with diabetic chronic kidney disease: Secondary | ICD-10-CM | POA: Diagnosis not present

## 2019-12-28 DIAGNOSIS — E889 Metabolic disorder, unspecified: Secondary | ICD-10-CM | POA: Diagnosis not present

## 2019-12-28 DIAGNOSIS — E871 Hypo-osmolality and hyponatremia: Secondary | ICD-10-CM | POA: Diagnosis not present

## 2020-01-11 DIAGNOSIS — I129 Hypertensive chronic kidney disease with stage 1 through stage 4 chronic kidney disease, or unspecified chronic kidney disease: Secondary | ICD-10-CM | POA: Diagnosis not present

## 2020-01-11 DIAGNOSIS — E871 Hypo-osmolality and hyponatremia: Secondary | ICD-10-CM | POA: Diagnosis not present

## 2020-01-11 DIAGNOSIS — N200 Calculus of kidney: Secondary | ICD-10-CM | POA: Diagnosis not present

## 2020-01-11 DIAGNOSIS — N189 Chronic kidney disease, unspecified: Secondary | ICD-10-CM | POA: Diagnosis not present

## 2020-01-11 DIAGNOSIS — E1122 Type 2 diabetes mellitus with diabetic chronic kidney disease: Secondary | ICD-10-CM | POA: Diagnosis not present

## 2020-01-11 DIAGNOSIS — E6609 Other obesity due to excess calories: Secondary | ICD-10-CM | POA: Diagnosis not present

## 2020-01-26 DIAGNOSIS — E1165 Type 2 diabetes mellitus with hyperglycemia: Secondary | ICD-10-CM | POA: Diagnosis not present

## 2020-01-26 DIAGNOSIS — Z794 Long term (current) use of insulin: Secondary | ICD-10-CM | POA: Diagnosis not present

## 2020-01-26 DIAGNOSIS — E1142 Type 2 diabetes mellitus with diabetic polyneuropathy: Secondary | ICD-10-CM | POA: Diagnosis not present

## 2020-01-26 DIAGNOSIS — Z299 Encounter for prophylactic measures, unspecified: Secondary | ICD-10-CM | POA: Diagnosis not present

## 2020-01-26 DIAGNOSIS — S88912A Complete traumatic amputation of left lower leg, level unspecified, initial encounter: Secondary | ICD-10-CM | POA: Diagnosis not present

## 2020-01-26 DIAGNOSIS — Z23 Encounter for immunization: Secondary | ICD-10-CM | POA: Diagnosis not present

## 2020-01-31 DIAGNOSIS — E162 Hypoglycemia, unspecified: Secondary | ICD-10-CM | POA: Diagnosis not present

## 2020-01-31 DIAGNOSIS — Z299 Encounter for prophylactic measures, unspecified: Secondary | ICD-10-CM | POA: Diagnosis not present

## 2020-01-31 DIAGNOSIS — K529 Noninfective gastroenteritis and colitis, unspecified: Secondary | ICD-10-CM | POA: Diagnosis not present

## 2020-01-31 DIAGNOSIS — I739 Peripheral vascular disease, unspecified: Secondary | ICD-10-CM | POA: Diagnosis not present

## 2020-01-31 DIAGNOSIS — E1165 Type 2 diabetes mellitus with hyperglycemia: Secondary | ICD-10-CM | POA: Diagnosis not present

## 2020-01-31 DIAGNOSIS — J069 Acute upper respiratory infection, unspecified: Secondary | ICD-10-CM | POA: Diagnosis not present

## 2020-02-14 DIAGNOSIS — Z23 Encounter for immunization: Secondary | ICD-10-CM | POA: Diagnosis not present

## 2020-02-22 DIAGNOSIS — E113293 Type 2 diabetes mellitus with mild nonproliferative diabetic retinopathy without macular edema, bilateral: Secondary | ICD-10-CM | POA: Diagnosis not present

## 2020-02-22 DIAGNOSIS — Z7984 Long term (current) use of oral hypoglycemic drugs: Secondary | ICD-10-CM | POA: Diagnosis not present

## 2020-02-22 DIAGNOSIS — H2513 Age-related nuclear cataract, bilateral: Secondary | ICD-10-CM | POA: Diagnosis not present

## 2020-02-22 DIAGNOSIS — H5203 Hypermetropia, bilateral: Secondary | ICD-10-CM | POA: Diagnosis not present

## 2020-02-22 DIAGNOSIS — Z794 Long term (current) use of insulin: Secondary | ICD-10-CM | POA: Diagnosis not present

## 2020-02-22 DIAGNOSIS — H524 Presbyopia: Secondary | ICD-10-CM | POA: Diagnosis not present

## 2020-02-29 DIAGNOSIS — Z0100 Encounter for examination of eyes and vision without abnormal findings: Secondary | ICD-10-CM | POA: Diagnosis not present

## 2020-03-07 DIAGNOSIS — E871 Hypo-osmolality and hyponatremia: Secondary | ICD-10-CM | POA: Diagnosis not present

## 2020-03-07 DIAGNOSIS — E1122 Type 2 diabetes mellitus with diabetic chronic kidney disease: Secondary | ICD-10-CM | POA: Diagnosis not present

## 2020-03-07 DIAGNOSIS — N189 Chronic kidney disease, unspecified: Secondary | ICD-10-CM | POA: Diagnosis not present

## 2020-03-07 DIAGNOSIS — N186 End stage renal disease: Secondary | ICD-10-CM | POA: Diagnosis not present

## 2020-03-07 DIAGNOSIS — I129 Hypertensive chronic kidney disease with stage 1 through stage 4 chronic kidney disease, or unspecified chronic kidney disease: Secondary | ICD-10-CM | POA: Diagnosis not present

## 2020-03-07 DIAGNOSIS — N200 Calculus of kidney: Secondary | ICD-10-CM | POA: Diagnosis not present

## 2020-03-14 DIAGNOSIS — I739 Peripheral vascular disease, unspecified: Secondary | ICD-10-CM | POA: Diagnosis not present

## 2020-03-14 DIAGNOSIS — E1142 Type 2 diabetes mellitus with diabetic polyneuropathy: Secondary | ICD-10-CM | POA: Diagnosis not present

## 2020-03-14 DIAGNOSIS — Z299 Encounter for prophylactic measures, unspecified: Secondary | ICD-10-CM | POA: Diagnosis not present

## 2020-03-14 DIAGNOSIS — E1165 Type 2 diabetes mellitus with hyperglycemia: Secondary | ICD-10-CM | POA: Diagnosis not present

## 2020-03-14 DIAGNOSIS — Z794 Long term (current) use of insulin: Secondary | ICD-10-CM | POA: Diagnosis not present

## 2020-03-21 DIAGNOSIS — N200 Calculus of kidney: Secondary | ICD-10-CM | POA: Diagnosis not present

## 2020-03-21 DIAGNOSIS — Z719 Counseling, unspecified: Secondary | ICD-10-CM | POA: Diagnosis not present

## 2020-03-21 DIAGNOSIS — N189 Chronic kidney disease, unspecified: Secondary | ICD-10-CM | POA: Diagnosis not present

## 2020-03-21 DIAGNOSIS — I129 Hypertensive chronic kidney disease with stage 1 through stage 4 chronic kidney disease, or unspecified chronic kidney disease: Secondary | ICD-10-CM | POA: Diagnosis not present

## 2020-03-21 DIAGNOSIS — E1122 Type 2 diabetes mellitus with diabetic chronic kidney disease: Secondary | ICD-10-CM | POA: Diagnosis not present

## 2020-03-21 DIAGNOSIS — E6609 Other obesity due to excess calories: Secondary | ICD-10-CM | POA: Diagnosis not present

## 2020-03-26 DIAGNOSIS — Z89512 Acquired absence of left leg below knee: Secondary | ICD-10-CM | POA: Diagnosis not present

## 2020-03-27 ENCOUNTER — Encounter (HOSPITAL_COMMUNITY): Payer: Self-pay | Admitting: *Deleted

## 2020-03-27 ENCOUNTER — Emergency Department (HOSPITAL_COMMUNITY): Payer: Medicare HMO

## 2020-03-27 ENCOUNTER — Emergency Department (HOSPITAL_COMMUNITY)
Admission: EM | Admit: 2020-03-27 | Discharge: 2020-03-27 | Disposition: A | Discharge status: Home / Self Care | Payer: Medicare HMO | Attending: Emergency Medicine | Admitting: Emergency Medicine

## 2020-03-27 ENCOUNTER — Other Ambulatory Visit: Payer: Self-pay

## 2020-03-27 DIAGNOSIS — R0789 Other chest pain: Secondary | ICD-10-CM | POA: Diagnosis not present

## 2020-03-27 DIAGNOSIS — Z87891 Personal history of nicotine dependence: Secondary | ICD-10-CM | POA: Insufficient documentation

## 2020-03-27 DIAGNOSIS — N183 Chronic kidney disease, stage 3 unspecified: Secondary | ICD-10-CM | POA: Diagnosis not present

## 2020-03-27 DIAGNOSIS — Z79899 Other long term (current) drug therapy: Secondary | ICD-10-CM | POA: Diagnosis not present

## 2020-03-27 DIAGNOSIS — R079 Chest pain, unspecified: Secondary | ICD-10-CM | POA: Diagnosis not present

## 2020-03-27 DIAGNOSIS — Z7984 Long term (current) use of oral hypoglycemic drugs: Secondary | ICD-10-CM | POA: Insufficient documentation

## 2020-03-27 DIAGNOSIS — I129 Hypertensive chronic kidney disease with stage 1 through stage 4 chronic kidney disease, or unspecified chronic kidney disease: Secondary | ICD-10-CM | POA: Diagnosis not present

## 2020-03-27 DIAGNOSIS — Z794 Long term (current) use of insulin: Secondary | ICD-10-CM | POA: Diagnosis not present

## 2020-03-27 DIAGNOSIS — Z7982 Long term (current) use of aspirin: Secondary | ICD-10-CM | POA: Insufficient documentation

## 2020-03-27 DIAGNOSIS — E1122 Type 2 diabetes mellitus with diabetic chronic kidney disease: Secondary | ICD-10-CM | POA: Diagnosis not present

## 2020-03-27 LAB — BASIC METABOLIC PANEL
Anion gap: 9 (ref 5–15)
BUN: 28 mg/dL — ABNORMAL HIGH (ref 6–20)
CO2: 20 mmol/L — ABNORMAL LOW (ref 22–32)
Calcium: 8.7 mg/dL — ABNORMAL LOW (ref 8.9–10.3)
Chloride: 107 mmol/L (ref 98–111)
Creatinine, Ser: 1.78 mg/dL — ABNORMAL HIGH (ref 0.61–1.24)
GFR, Estimated: 43 mL/min — ABNORMAL LOW (ref >60)
Glucose, Bld: 204 mg/dL — ABNORMAL HIGH (ref 70–99)
Potassium: 4.1 mmol/L (ref 3.5–5.1)
Sodium: 136 mmol/L (ref 135–145)

## 2020-03-27 LAB — HEPATIC FUNCTION PANEL
ALT: 34 U/L (ref 0–44)
AST: 35 U/L (ref 15–41)
Albumin: 3.6 g/dL (ref 3.5–5.0)
Alkaline Phosphatase: 77 U/L (ref 38–126)
Bilirubin, Direct: 0.1 mg/dL (ref 0.0–0.2)
Indirect Bilirubin: 0.3 mg/dL (ref 0.3–0.9)
Total Bilirubin: 0.4 mg/dL (ref 0.3–1.2)
Total Protein: 7.1 g/dL (ref 6.5–8.1)

## 2020-03-27 LAB — CBC
HCT: 44.6 % (ref 39.0–52.0)
Hemoglobin: 14.9 g/dL (ref 13.0–17.0)
MCH: 29.9 pg (ref 26.0–34.0)
MCHC: 33.4 g/dL (ref 30.0–36.0)
MCV: 89.6 fL (ref 80.0–100.0)
Platelets: 143 10*3/uL — ABNORMAL LOW (ref 150–400)
RBC: 4.98 MIL/uL (ref 4.22–5.81)
RDW: 14.1 % (ref 11.5–15.5)
WBC: 6.2 10*3/uL (ref 4.0–10.5)
nRBC: 0 % (ref 0.0–0.2)

## 2020-03-27 LAB — D-DIMER, QUANTITATIVE: D-Dimer, Quant: 0.8 ug/mL-FEU — ABNORMAL HIGH (ref 0.00–0.50)

## 2020-03-27 LAB — LIPASE, BLOOD: Lipase: 42 U/L (ref 11–51)

## 2020-03-27 LAB — TROPONIN I (HIGH SENSITIVITY)
Troponin I (High Sensitivity): 6 ng/L (ref <18)
Troponin I (High Sensitivity): 6 ng/L (ref <18)

## 2020-03-27 MED ORDER — IOHEXOL 350 MG/ML SOLN
75.0000 mL | Freq: Once | INTRAVENOUS | Status: AC | PRN
  Administered 2020-03-27: 75 mL via INTRAVENOUS

## 2020-03-27 NOTE — ED Notes (Signed)
ED Provider at bedside. 

## 2020-03-27 NOTE — ED Notes (Signed)
Patient transported to X-ray 

## 2020-03-27 NOTE — Discharge Instructions (Addendum)
Please follow-up with your cardiologist.  Please call tomorrow morning to make an appointment.  Your CT PE study was negative for pulmonary embolism.  Your cardiac enzymes were within normal limits.  I recommend drinking plenty of water, take Tylenol for pain he may use [phone] mg every 6 hours.  Have also prescribed a muscle relaxer.  Please return to the ER for any new or concerning symptoms.

## 2020-03-27 NOTE — ED Notes (Signed)
Discharge paperwork provided to patient. All questions answered at this time. Pt ambulated off unit without difficulty and appears to be in NAD. VSS.

## 2020-03-27 NOTE — ED Provider Notes (Signed)
[facility] EMERGENCY DEPARTMENT Provider Note   CSN: [id number] Arrival date & time: 03/27/20  1423     History Chief Complaint  Patient presents with  . Chest Pain    BRETON ROMINE is a 60 y.o. male.  HPI Patient is a 60 year old male with a past medical history significant for microvascular angina with history of multiple catheterizations in the past [name], 2012 (no sign obstructive atherosclerosis/CAD). Last echo 03/2017 - EF 55-60% no regional wall motion abn.  Also has history of hyperlipidemia/dyslipidemia, HTN, diabetes, obesity, TIA, PE, CKD 3, neuropathy with past family history significant heart attack  Prescribed Ranexa  Patient states that Saturday at approximately noon when he was burying one of his horses that he states that he is very close to "like the family dog ". He states that he noticed that he was having some chest pain since that time.  He states that it is not exertional states that it is not pleuritic.  It that he describes as sharp and relatively constant.  It seems to be worse occasionally with movement.  He denies any shortness of breath nausea or vomiting.  He does have a history of pulmonary embolism and is not on anticoagulation.  He states that this was many many years ago he states more than 10.  He states that he does not recall what caused him to have a blood clot--however he was left on long-term anticoagulation.  He states that he does not exactly recall his symptoms that he had with his pulmonary embolism although did not include chest pain.  No recent travel, no hemoptysis, cough, fevers, chills.  No recent surgeries or immobilization.  He denies any unilateral or bilateral leg pain or swelling.    Past Medical History:  Diagnosis Date  . Allergic rhinitis, cause unspecified   . Anginal pain ([place])   . Arthritis   . BPH (benign prostatic hyperplasia)   . Cervicalgia   . Chest pain Sept. [name]   multiple caths  /  cath 06/15/2010..normal  coronaries,  EF 60%   (false postive nuclear in the past)  . CKD (chronic kidney disease), stage III ([place])   . Complication of anesthesia    Hard time waking up patient stated low blood pressures around 2017  . Dyslipidemia    mixed  . Esophageal reflux   . Fibromyalgia   . Gastroparesis   . Headache(784.0)   . History of kidney stones   . Hyperlipidemia   . Hypertension   . Hypoplasia of one kidney   . IDDM (insulin dependent diabetes mellitus)   . Lumbago   . Neuropathy associated with endocrine disorder ([name])   . Overweight(278.02)   . Peripheral vascular disease ([place])   . Pulmonary embolus (HCC)    Hx of small left lower lobe  pulmonary embolus  . Pulmonary embolus ([place]) 2010ish  . Stroke [facility])    mini stroke aug 2016  . Type II or unspecified type diabetes mellitus with neurological manifestations, not stated as uncontrolled(250.60)   . Urticaria, unspecified   . Wears glasses     Patient Active Problem List   Diagnosis Date Noted  . Gangrene of left foot ([place]) 01/12/2019  . Subacute osteomyelitis, left ankle and foot ([place])   . Acute osteomyelitis of left calcaneus (HCC)   . Ulcer of left heel and midfoot with necrosis of bone ([place])   . Gastroesophageal reflux disease without esophagitis 11/09/2017  . Abdominal pain, chronic, epigastric 11/09/2017  .  Numbness   . Syncope 03/13/2017  . H/O cervical spine surgery 03/13/2017  . Cervical spondylosis with radiculopathy 12/29/2016  . CVA (cerebral vascular accident) ([place]) 08/30/2014  . Diabetes with skin complication ([place]) 123XX123  . Neuropathy 08/30/2014  . AKI (acute kidney injury) ([place]) 08/30/2014  . Bilateral occipital neuralgia 10/30/2013  . Lumbar stenosis without neurogenic claudication 01/12/2013  . Type 2 diabetes mellitus with vascular disease ([place])   . Hypertension   . Hyperlipidemia   . Gastroparesis   . Pulmonary embolus ([place]) 2012   . Obesity   . GERD 01/14/2010  . Chest pain 09/01/[name]    Past  Surgical History:  Procedure Laterality Date  . ACHILLES TENDON REPAIR Left 2013  . AMPUTATION Left 06/02/2013   Procedure: AMPUTATION 1ST TOE LEFT FOOT;  Surgeon: Marcheta Grammes, DPM;  Location: [facility];  Service: Orthopedics;  Laterality: Left;  . AMPUTATION Left 07/21/2013   Procedure: PARTIAL AMPUTATION 2ND TOE LEFT FOOT;  Surgeon: Marcheta Grammes, DPM;  Location: [facility];  Service: Podiatry;  Laterality: Left;  . AMPUTATION Left 01/12/2019   Procedure: LEFT BELOW KNEE AMPUTATION;  Surgeon: Newt Minion, MD;  Location: [place];  Service: Orthopedics;  Laterality: Left;  . ANTERIOR CERVICAL DECOMP/DISCECTOMY FUSION N/A 12/29/2016   Procedure: Cervical five-six Cervical six-seven Anterior cervical discectomy with fusion and plate fixation;  Surgeon: Ditty, Kevan Ny, MD;  Location: [place];  Service: Neurosurgery;  Laterality: N/A;  . APPENDECTOMY    . BACK SURGERY  1999   x 6 some fusions  . BELOW KNEE LEG AMPUTATION Left 01/2019  . BIOPSY  12/18/2017   Procedure: BIOPSY;  Surgeon: Rogene Houston, MD;  [facility];  Service: Endoscopy;;  gastric and esophagus  . colonscopy    . ESOPHAGOGASTRODUODENOSCOPY (EGD) WITH PROPOFOL N/A 12/18/2017   Procedure: ESOPHAGOGASTRODUODENOSCOPY (EGD) WITH PROPOFOL;  Surgeon: Rogene Houston, MD;  [facility];  Service: Endoscopy;  Laterality: N/A;  9:25  . FOOT ARTHRODESIS Right 01/11/2014   Procedure: ARTHRODESIS INTERPHALANGEAL JOINT HALLUX RIGHT FOOT;  Surgeon: Marcheta Grammes, DPM;  Location: [facility];  Service: Podiatry;  Laterality: Right;  . HARDWARE REMOVAL Right 01/17/2015   Procedure: HARDWARE REMOVAL;  Surgeon: Caprice Beaver, DPM;  Location: [facility];  Service: Podiatry;  Laterality: Right;  . I & D EXTREMITY Left 12/22/2018   Procedure: LEFT PARTIAL CALCANEAL EXCISION;  Surgeon: Newt Minion, MD;  Location: [place];  Service: Orthopedics;  Laterality: Left;  . KNEE ARTHROSCOPY Right 10/2015  .  LUMBAR LAMINECTOMY/DECOMPRESSION MICRODISCECTOMY Right 01/12/2013   Procedure: Right Lumbar Three-Four Laminotomy/Foraminotomy;  Surgeon: Floyce Stakes, MD;  [facility];  Service: Neurosurgery;  Laterality: Right;  Right Lumbar Three-Four Laminotomy/Foraminotomy  . NISSEN FUNDOPLICATION    . SHOULDER ARTHROSCOPY W/ ROTATOR CUFF REPAIR Right   . SHOULDER ARTHROSCOPY WITH DISTAL CLAVICLE RESECTION Right 10/04/2018   Procedure: RIGHT SHOULDER ARTHROSCOPY WITH DISTAL CLAVICLE RESECTION, EXTENSIVE DEBRIDEMENT, SUBACROMIAL PARTIAL ACROMIOPLASTY,;  Surgeon: Earlie Server, MD;  Location: [place];  Service: Orthopedics;  Laterality: Right;  PRE/POST OP SCALENE       Family History  Problem Relation Age of Onset  . Heart attack Father 41  . Hyperlipidemia Father   . Hypertension Father   . Heart attack Mother 37  . Diabetes Mother   . Hypertension Mother   . Hyperlipidemia Mother   . Healthy Brother   . Seizures Brother   . Migraines Neg Hx  Social History   Tobacco Use  . Smoking status: Former Smoker    Packs/day: 3.00    Years: 5.00    Pack years: 15.00    Types: Cigarettes    Start date: 06/20/1977    Quit date: 07/20/1982    Years since quitting: 37.7  . Smokeless tobacco: Never Used  . Tobacco comment: quit 1980's  Vaping Use  . Vaping Use: Never used  Substance Use Topics  . Alcohol use: No    Alcohol/week: 0.0 standard drinks  . Drug use: No    Home Medications Prior to Admission medications   Medication Sig Start Date End Date Taking? Authorizing Provider  aspirin EC 81 MG tablet Take 81 mg by mouth daily.    [provider]  atorvastatin (LIPITOR) 40 MG tablet Take 1 tablet (40 mg total) by mouth daily. 06/01/19   Verta Ellen., NP  empagliflozin (JARDIANCE) 25 MG TABS tablet Take 25 mg by mouth daily.    [provider]  EPINEPHrine 0.3 mg/0.3 mL IJ SOAJ injection Inject 0.3 mg into the muscle as needed for  anaphylaxis.    [provider]  ferrous sulfate 325 (65 FE) MG tablet Take 325 mg by mouth daily.    [provider]  gabapentin (NEURONTIN) 600 MG tablet 1 tab by mouth every morning & 3 tabs at bedtime    [provider]  glipiZIDE (GLUCOTROL) 10 MG tablet Take 10 mg by mouth daily.    [provider]  insulin detemir (LEVEMIR) 100 UNIT/ML injection Inject 67 Units into the skin at bedtime.    [provider]  methocarbamol (ROBAXIN) 500 MG tablet Take 1 tablet (500 mg total) by mouth every 6 (six) hours as needed for muscle spasms. 01/24/19   Persons, Bevely Palmer, PA  omega-3 acid ethyl esters (LOVAZA) 1 g capsule Take 1 g by mouth daily.  03/10/18   [provider]  ranolazine (RANEXA) 1000 MG SR tablet Take 1 tablet (1,000 mg total) by mouth daily. 06/01/19   Verta Ellen., NP  sodium bicarbonate 650 MG tablet Take 650 mg by mouth 2 (two) times daily.     [provider]    Allergies    Yellow jacket venom [bee venom] and Reglan [metoclopramide]  Review of Systems   Review of Systems  Constitutional: Negative for chills and fever.  HENT: Negative for congestion.   Eyes: Negative for pain.  Respiratory: Negative for cough and shortness of breath.   Cardiovascular: Positive for chest pain. Negative for leg swelling.  Gastrointestinal: Negative for abdominal pain and vomiting.  Genitourinary: Negative for dysuria.  Musculoskeletal: Negative for myalgias.  Skin: Negative for rash.  Neurological: Negative for dizziness and headaches.    Physical Exam Updated Vital Signs BP (!) 162/91   Pulse (!) 53   Temp 97.9 F (36.6 C) (Oral)   Resp 17   SpO2 100%   Physical Exam Vitals and nursing note reviewed.  Constitutional:      General: He is not in acute distress. HENT:     Head: Normocephalic and atraumatic.     Nose: Nose normal.     Mouth/Throat:     Mouth: Mucous membranes are moist.  Eyes:     General: No  scleral icterus. Cardiovascular:     Rate and Rhythm: Normal rate and regular rhythm.     Pulses: Normal pulses.     Heart sounds: Normal heart sounds.  Comments: Pulses symmetric and 3+ bilateral radial and DP No chest wall tenderness to palpation Pulmonary:     Effort: Pulmonary effort is normal. No respiratory distress.     Breath sounds: No wheezing.  Abdominal:     Palpations: Abdomen is soft.     Tenderness: There is no abdominal tenderness. There is no right CVA tenderness, left CVA tenderness, guarding or rebound.     Comments: Soft nontender abdomen.  No epigastric tenderness to palpation.  Musculoskeletal:     Cervical back: Normal range of motion.     Right lower leg: No edema.     Left lower leg: No edema.     Comments: S/p left BKA prosthesis in place.  Stump examined with no evidence of infection. No right calf tenderness.  Skin:    General: Skin is warm and dry.     Capillary Refill: Capillary refill takes less than 2 seconds.  Neurological:     Mental Status: He is alert. Mental status is at baseline.  Psychiatric:        Mood and Affect: Mood normal.        Behavior: Behavior normal.     ED Results / Procedures / Treatments   Labs (all labs ordered are listed, but only abnormal results are displayed) Labs Reviewed  BASIC METABOLIC PANEL - Abnormal; Notable for the following components:      Result Value   CO2 20 (*)    Glucose, Bld 204 (*)    BUN 28 (*)    Creatinine, Ser 1.78 (*)    Calcium 8.7 (*)    GFR, Estimated 43 (*)    All other components within normal limits  CBC - Abnormal; Notable for the following components:   Platelets 143 (*)    All other components within normal limits  D-DIMER, QUANTITATIVE - Abnormal; Notable for the following components:   D-Dimer, Quant 0.80 (*)    All other components within normal limits  HEPATIC FUNCTION PANEL  LIPASE, BLOOD  TROPONIN I (HIGH SENSITIVITY)  TROPONIN I (HIGH SENSITIVITY)    EKG EKG  Interpretation  Date/Time:  Tuesday March 27 2020 14:31:01 EST Ventricular Rate:  69 PR Interval:  192 QRS Duration: 78 QT Interval:  380 QTC Calculation: 407 R Axis:   13 Text Interpretation: Normal sinus rhythm Normal ECG Since last tracing rate slower Confirmed by Dorie Rank [phone]) on 03/27/2020 2:44:08 PM   Radiology No results found.  Procedures Procedures   Medications Ordered in ED Medications  iohexol (OMNIPAQUE) 350 MG/ML injection 75 mL (75 mLs Intravenous Contrast Given 03/27/20 1753)    ED Course  I have reviewed the triage vital signs and the nursing notes.  Pertinent labs & imaging results that were available during my care of the patient were reviewed by me and considered in my medical decision making (see chart for details).  Patient is 60 year old male with past medical history detailed in HPI presented today for chest pain.  It is very atypical for ACS.  He does have a history of PE and is not on long-term anticoagulation.  I did review patient's chart and see that he has an elevated dimer in the past.  I therefore discussed dimer as a tool for restratification will find any physician who recommended dimer.  Physical exam is unremarkable.   Clinical Course as of 03/30/20 [id number]  Tue Mar 27, 2020  1623 Discussed with Dr. Domenic Polite of cardiology who reviewed chest x-ray and EKG and agrees with  plan to discharge with delta troponin is without any significant delta. [WF]  1853 Troponin x2 flat at 6  Lipase within normal with no pancreatit back function panel shows no LFT abnormalities.  BMP with mildly elevated blood sugar 204 BUN and creatinine actually improved from baseline.  CO2 marginally low no anion gap.  CBC without leukocytosis or anemia  Dimer elevated.  CT PE study obtained and pending read at this time. [WF]    Clinical Course User Index [WF] Tedd Sias, PA   EKG is nonischemic  CT PE study without evidence of PE.  Chest x-ray without  abnormality.  Doubt ACS. Patient's CP is atypical for ACS. Is non-radiating, substernal, not described as chest pressure and is not precipitated/worsened by exertion.  However did talk with patient's cardiologist and he will have close follow-up.  Given strict return precautions.  Doubt thoracic aortic dissection as pain lacks tearing or severe quality and does not radiate to back. Patient denies nausea, diaphoresis. Symmetric pulses, no murmur, no neurologic deficit. CXR without widened mediastinum.  Doubt pericarditis as no recent illness, PE without muffled heart sounds or friction rub, CP is non-pleuritic, EKG without global STE or PR depression.   MDM Rules/Calculators/A&P                          PE ruled out.  Patient has close follow-up with cardiologist for evaluation of patient's chest pain.  Return precautions given.  He is agreeable to plan.  Able to teach back plan.  All questions answered best my ability.  Patient is eager for discharge at this time.  Final Clinical Impression(s) / ED Diagnoses Final diagnoses:  Chest pain, unspecified type    Rx / DC Orders ED Discharge Orders    None       Tedd Sias, [place] 03/30/20 [id number]    Milton Ferguson, MD 03/30/20 [phone]

## 2020-03-27 NOTE — ED Notes (Signed)
Patient transported to CT 

## 2020-04-02 DIAGNOSIS — E1165 Type 2 diabetes mellitus with hyperglycemia: Secondary | ICD-10-CM | POA: Diagnosis not present

## 2020-04-02 NOTE — Progress Notes (Signed)
Cardiology Office Note  Date: 04/03/2020   ID: [name], DOB 14-Nov-1960, MRN [id number]  PCP:  Dustin Leyden, FNP  Cardiologist:  No primary care provider on file. Electrophysiologist:  None   Chief Complaint: ER follow-up [name] for chest pain   History of Present Illness: [name] is a 60 y.o. 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  Cardiac catheterizations in 2005 and 2012 normal coronaries.  History of TIA, HTN, HLD, IDDM.  Last saw Dr. Bronson Ing 04/02/2018.  Denied any symptoms of chest pain palpitations, shortness of breath, lightheadedness dizziness, swelling, orthopnea, PND, or syncope.  His primary complaints were related to neuropathy in bilateral hands and feet.  Patient was continuing his Ranexa for presumed microvascular angina, blood pressure was controlled on current therapy.  He continued his atorvastatin for hyperlipidemia.     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  He has a left leg prosthesis.  Following with Dr. Theador Hawthorne nephrology in [place].  He has periodic lab work from both primary care provider and nephrologist.    Recent emergency room visit on 03/27/2020.  Experienced some some chest pain.  Described as sharp and relatively constant.  Worse with movement.  No shortness of breath, nausea, no vomiting.  Case was discussed with Dr. Domenic Polite cardiology.  Reviewed chest x-ray and EKG and agreed with plan to discharge.  Troponin was flat x2.  Lipase was normal.  BMP with mildly elevated.  blood sugar.  BUN/creatinine improved from baseline.  CBC without leukocytosis or anemia.  D-dimer elevated.  CT showed no evidence of pulmonary embolism.   He is here for follow-up from recent emergency room visit.  He continues to have chest pain with some mild exertion.  States sometimes the chest pain radiates up to his left shoulder and down his left  arm.  He denies any associated nausea, vomiting, or diaphoresis.  He denies any increase in dyspnea or shortness of breath with exertion.  He has significant issues with diabetes with left BKA.  Blood pressure is elevated today at 142/90.  He states his blood pressure is usually elevated to doctors offices.  States it is usually better at home.  He denies any palpitations or arrhythmias, orthostatic symptoms, CVA or TIA-like symptoms.  No blood in stool or urine.  He is concerned it may be his heart and states he has not had a stress test in quite some time.  Currently on Ranexa only.  No sublingual nitroglycerin or Imdur.   Past Medical History:  Diagnosis Date  . Allergic rhinitis, cause unspecified   . Anginal pain ([place])   . Arthritis   . BPH (benign prostatic hyperplasia)   . Cervicalgia   . Chest pain Sept. 2005   multiple caths  /  cath 06/15/2010..normal coronaries,  EF 60%   (false postive nuclear in the past)  . CKD (chronic kidney disease), stage III ([place])   . Complication of anesthesia    Hard time waking up patient stated low blood pressures around 2017  . Dyslipidemia    mixed  . Esophageal reflux   . Fibromyalgia   . Gastroparesis   . Headache(784.0)   . History of kidney stones   . Hyperlipidemia   . Hypertension   . Hypoplasia of one kidney   . IDDM (insulin dependent diabetes mellitus)   . Lumbago   . Neuropathy associated with endocrine disorder ([place])   .  Overweight(278.02)   . Peripheral vascular disease ([place])   . Pulmonary embolus ([facility])    Hx of small left lower lobe  pulmonary embolus  . Pulmonary embolus ([place]) 2010ish  . Stroke [facility])    mini stroke aug 2016  . Type II or unspecified type diabetes mellitus with neurological manifestations, not stated as uncontrolled(250.60)   . Urticaria, unspecified   . Wears glasses     Past Surgical History:  Procedure Laterality Date  . ACHILLES TENDON REPAIR Left 2013  . AMPUTATION Left 06/02/2013   Procedure:  AMPUTATION 1ST TOE LEFT FOOT;  Surgeon: Marcheta Grammes, DPM;  Location: [facility];  Service: Orthopedics;  Laterality: Left;  . AMPUTATION Left 07/21/2013   Procedure: PARTIAL AMPUTATION 2ND TOE LEFT FOOT;  Surgeon: Marcheta Grammes, DPM;  Location: [facility];  Service: Podiatry;  Laterality: Left;  . AMPUTATION Left 01/12/2019   Procedure: LEFT BELOW KNEE AMPUTATION;  Surgeon: Newt Minion, MD;  Location: [facility];  Service: Orthopedics;  Laterality: Left;  . ANTERIOR CERVICAL DECOMP/DISCECTOMY FUSION N/A 12/29/2016   Procedure: Cervical five-six Cervical six-seven Anterior cervical discectomy with fusion and plate fixation;  Surgeon: Ditty, Kevan Ny, MD;  Location: [facility];  Service: Neurosurgery;  Laterality: N/A;  . APPENDECTOMY    . BACK SURGERY  1999   x 6 some fusions  . BELOW KNEE LEG AMPUTATION Left 01/2019  . BIOPSY  12/18/2017   Procedure: BIOPSY;  Surgeon: Rogene Houston, MD;  [facility];  Service: Endoscopy;;  gastric and esophagus  . colonscopy    . ESOPHAGOGASTRODUODENOSCOPY (EGD) WITH PROPOFOL N/A 12/18/2017   Procedure: ESOPHAGOGASTRODUODENOSCOPY (EGD) WITH PROPOFOL;  Surgeon: Rogene Houston, MD;  [facility];  Service: Endoscopy;  Laterality: N/A;  9:25  . FOOT ARTHRODESIS Right 01/11/2014   Procedure: ARTHRODESIS INTERPHALANGEAL JOINT HALLUX RIGHT FOOT;  Surgeon: Marcheta Grammes, DPM;  Location: [facility];  Service: Podiatry;  Laterality: Right;  . HARDWARE REMOVAL Right 01/17/2015   Procedure: HARDWARE REMOVAL;  Surgeon: Caprice Beaver, DPM;  Location: [facility];  Service: Podiatry;  Laterality: Right;  . I & D EXTREMITY Left 12/22/2018   Procedure: LEFT PARTIAL CALCANEAL EXCISION;  Surgeon: Newt Minion, MD;  Location: [facility];  Service: Orthopedics;  Laterality: Left;  . KNEE ARTHROSCOPY Right 10/2015  . LUMBAR LAMINECTOMY/DECOMPRESSION MICRODISCECTOMY Right 01/12/2013   Procedure: Right Lumbar Three-Four Laminotomy/Foraminotomy;   Surgeon: Floyce Stakes, MD;  [facility];  Service: Neurosurgery;  Laterality: Right;  Right Lumbar Three-Four Laminotomy/Foraminotomy  . NISSEN FUNDOPLICATION    . SHOULDER ARTHROSCOPY W/ ROTATOR CUFF REPAIR Right   . SHOULDER ARTHROSCOPY WITH DISTAL CLAVICLE RESECTION Right 10/04/2018   Procedure: RIGHT SHOULDER ARTHROSCOPY WITH DISTAL CLAVICLE RESECTION, EXTENSIVE DEBRIDEMENT, SUBACROMIAL PARTIAL ACROMIOPLASTY,;  Surgeon: Earlie Server, MD;  Location: [facility];  Service: Orthopedics;  Laterality: Right;  PRE/POST OP SCALENE    Current Outpatient Medications  Medication Sig Dispense Refill  . atorvastatin (LIPITOR) 40 MG tablet Take 1 tablet (40 mg total) by mouth daily. 30 tablet 11  . EPINEPHrine 0.3 mg/0.3 mL IJ SOAJ injection Inject 0.3 mg into the muscle as needed for anaphylaxis.    [name] FARXIGA 10 MG TABS tablet Take 10 mg by mouth daily.    [name] gabapentin (NEURONTIN) 600 MG tablet 1 tab by mouth every morning & 3 tabs at bedtime    . glipiZIDE (GLUCOTROL) 10 MG tablet Take 10 mg by mouth daily.    [name]  insulin detemir (LEVEMIR) 100 UNIT/ML injection Inject 67 Units into the skin at bedtime.    [name] omega-3 acid ethyl esters (LOVAZA) 1 g capsule Take 1 g by mouth daily.     . ranolazine (RANEXA) 1000 MG SR tablet Take 1 tablet (1,000 mg total) by mouth daily. 30 tablet 11  . sodium bicarbonate 650 MG tablet Take 650 mg by mouth 2 (two) times daily.      No current facility-administered medications for this visit.   Allergies:  Yellow jacket venom [bee venom] and Reglan [metoclopramide]   Social History: The patient  reports that he quit smoking about 37 years ago. His smoking use included cigarettes. He started smoking about 42 years ago. He has a 15.00 pack-year smoking history. He has never used smokeless tobacco. He reports that he does not drink alcohol and does not use drugs.   Family History: The patient's family history includes Diabetes in his mother;  Healthy in his brother; Heart attack (age of onset: 87) in his father; Heart attack (age of onset: 49) in his mother; Hyperlipidemia in his father and mother; Hypertension in his father and mother; Seizures in his brother.   ROS:  Please see the history of present illness. Otherwise, complete review of systems is positive for none.  All other systems are reviewed and negative.   Physical Exam: VS:  BP (!) 142/90   Pulse 66   Ht '6\' 5"'$  (1.956 m)   Wt 279 lb (126.6 kg)   SpO2 96%   BMI 33.08 kg/m , BMI Body mass index is 33.08 kg/m.  Wt Readings from Last 3 Encounters:  04/03/20 279 lb (126.6 kg)  06/01/19 264 lb 12.8 oz (120.1 kg)  05/05/19 265 lb (120.2 kg)    General: Patient appears comfortable at rest. Neck: Supple, no elevated JVP or carotid bruits, no thyromegaly. Lungs: Clear to auscultation, nonlabored breathing at rest. Cardiac: Regular rate and rhythm, no S3 or significant systolic murmur, no pericardial rub. Extremities: No pitting edema, distal pulses 1+ on right.  Recent BKA to left leg secondary to severe osteomyelitis from nonhealing diabetic ulcer. Has a prosthetic leg. Skin: Warm and dry. Musculoskeletal: No kyphosis. Neuropsychiatric: Alert and oriented x3, affect grossly appropriate.  ECG:  An ECG dated 04/06/2018 was personally reviewed today and demonstrated:  Sinus tachycardia rate of 105  Recent Labwork: 03/27/2020: ALT 34; AST 35; BUN 28; Creatinine, Ser 1.78; Hemoglobin 14.9; Platelets 143; Potassium 4.1; Sodium 136     Component Value Date/Time   CHOL 151 03/14/2017 1658   TRIG 111 03/14/2017 1658   HDL 33 (L) 03/14/2017 1658   CHOLHDL 4.6 03/14/2017 1658   VLDL 22 03/14/2017 1658   LDLCALC 96 03/14/2017 1658    Other Studies Reviewed Today:  Echocardiogram: 03/2017 Study Conclusions  - Left ventricle: Inferobasal hypokinesis The cavity size was normal. There was mild focal basal hypertrophy of the septum. Systolic function was normal. The  estimated ejection fraction was in the range of 55% to 60%. Wall motion was normal; there were no regional wall motion abnormalities. Doppler parameters are consistent with abnormal left ventricular relaxation (grade 1 diastolic dysfunction). - Mitral valve: Valve area by pressure half-time: 1.55 cm^2. - Left atrium: The atrium was mildly dilated.  Cardiac Catheterization: 06/2010 ANGIOGRAPHIC DATA: The right coronary artery arises somewhat anteriorly. It is a small nondominant vessel and is normal.  The left main coronary artery is normal.  The left anterior descending artery is a large vessel and is  normal.  There is a moderate ramus intermediate branch which is normal.  Left circumflex coronary artery is a dominant vessel. There is minor narrowing of the third obtuse marginal vessel up to 40%. Otherwise, the left circumflex system appears normal.  Left ventricular angiography performed in RAO view demonstrates normal left ventricular size and contractility with ejection fraction estimated at 60%.  FINAL INTERPRETATION: 1. No significant obstructive atherosclerotic coronary artery disease. 2. Normal left ventricular function  Assessment and Plan:   1. Chest pain, unspecified type He denies any recent progressive anginal or exertional symptoms.  Continue aspirin 81 mg, ranolazine 1000 mg SR daily.  Please refill both ranolazine and aspirin at current dosages.  Continue to list chest pain after recent hospital visit.  Please start Imdur 15 mg p.o. daily and get a Lexiscan stress test.  2. Essential hypertension Blood pressure is elevated today at 142/90.  States his blood pressure is always up at the doctor's office.  Not on antihypertensive medications at this time.  3. Hyperlipidemia LDL goal <70 No recent lipid panels from PCP office.  We will attempt to obtain those results continue atorvastatin 40 mg daily.  Please refill atorvastatin 40 mg .  Continue  omega-3 1 g daily.  Recent lipid panel from PCP office collected on 05/09/2019 showed total cholesterol 167, triglycerides 237, HDL 31, LDL 96  4. Type 2 diabetes mellitus with vascular disease ([place]) Managed by PCP.  Patient is currently taking glipizide 10 mg daily, insulin detemir 67 units at bedtime. States he was recently started on Farxiga 10 mg by primary care.    Medication Adjustments/Labs and Tests Ordered: Current medicines are reviewed at length with the patient today.  Concerns regarding medicines are outlined above.   Disposition: Follow-up with Dr. Dr. Harl Bowie or APP 4 to 6 weeks  Signed, Levell July, NP 04/03/2020 10:06 AM    [facility] [address], [place], [place] [address] Phone: [phone]; Fax: [phone]

## 2020-04-03 ENCOUNTER — Encounter: Payer: Self-pay | Admitting: *Deleted

## 2020-04-03 ENCOUNTER — Encounter: Payer: Self-pay | Admitting: Family Medicine

## 2020-04-03 ENCOUNTER — Ambulatory Visit: Payer: Medicare HMO | Admitting: Family Medicine

## 2020-04-03 VITALS — BP 142/90 | HR 66 | Ht 77.0 in | Wt 279.0 lb

## 2020-04-03 DIAGNOSIS — R0789 Other chest pain: Secondary | ICD-10-CM

## 2020-04-03 DIAGNOSIS — R079 Chest pain, unspecified: Secondary | ICD-10-CM | POA: Diagnosis not present

## 2020-04-03 MED ORDER — ISOSORBIDE MONONITRATE ER 30 MG PO TB24: 15.0000 mg | ORAL_TABLET | Freq: Every day | ORAL | 6 refills | Status: DC

## 2020-04-03 NOTE — Patient Instructions (Signed)
Medication Instructions:   Begin Imdur '15mg'$  daily.   Continue all other medications.    Labwork: none  Testing/Procedures:  Your physician has requested that you have a lexiscan myoview. For further information please visit [email or web address]. Please follow instruction sheet, as given.  Office will contact with results via phone or letter.    Follow-Up: 4-6 weeks   Any Other Special Instructions Will Be Listed Below (If Applicable).  If you need a refill on your cardiac medications before your next appointment, please call your pharmacy.

## 2020-04-10 ENCOUNTER — Ambulatory Visit (HOSPITAL_COMMUNITY)
Admission: RE | Admit: 2020-04-10 | Discharge: 2020-04-10 | Disposition: A | Discharge status: Home / Self Care | Payer: Medicare HMO | Source: Ambulatory Visit | Attending: Family Medicine | Admitting: Family Medicine

## 2020-04-10 ENCOUNTER — Encounter (HOSPITAL_COMMUNITY): Payer: Self-pay

## 2020-04-10 ENCOUNTER — Encounter (HOSPITAL_COMMUNITY)
Admission: RE | Admit: 2020-04-10 | Discharge: 2020-04-10 | Disposition: A | Discharge status: Home / Self Care | Payer: Medicare HMO | Source: Ambulatory Visit | Attending: Family Medicine | Admitting: Family Medicine

## 2020-04-10 DIAGNOSIS — R0789 Other chest pain: Secondary | ICD-10-CM | POA: Diagnosis not present

## 2020-04-10 DIAGNOSIS — R079 Chest pain, unspecified: Secondary | ICD-10-CM | POA: Diagnosis not present

## 2020-04-10 HISTORY — DX: Disorder of kidney and ureter, unspecified: N28.9

## 2020-04-10 LAB — NM MYOCAR MULTI W/SPECT W/WALL MOTION / EF
LV dias vol: 128 mL (ref 62–150)
LV sys vol: 55 mL
Peak HR: 80 {beats}/min
RATE: 0.32
Rest HR: 65 {beats}/min
SDS: 1
SRS: 1
SSS: 2
TID: 1.11

## 2020-04-10 MED ORDER — TECHNETIUM TC 99M TETROFOSMIN IV KIT
30.0000 | PACK | Freq: Once | INTRAVENOUS | Status: AC | PRN
  Administered 2020-04-10: 31.2 via INTRAVENOUS

## 2020-04-10 MED ORDER — TECHNETIUM TC 99M TETROFOSMIN IV KIT
10.0000 | PACK | Freq: Once | INTRAVENOUS | Status: AC | PRN
  Administered 2020-04-10: 10.9 via INTRAVENOUS

## 2020-04-10 MED ORDER — SODIUM CHLORIDE FLUSH 0.9 % IV SOLN
INTRAVENOUS | Status: AC
  Administered 2020-04-10: 10 mL via INTRAVENOUS
  Filled 2020-04-10: qty 10

## 2020-04-10 MED ORDER — REGADENOSON 0.4 MG/5ML IV SOLN
INTRAVENOUS | Status: AC
  Administered 2020-04-10: 0.4 mg via INTRAVENOUS
  Filled 2020-04-10: qty 5

## 2020-04-12 ENCOUNTER — Telehealth: Payer: Self-pay | Admitting: Family Medicine

## 2020-04-12 NOTE — Telephone Encounter (Signed)
-----   Message from Verta Ellen., NP sent at 04/10/2020  4:58 PM EST ----- Please call the patient and let him know the stress test did not show any evidence of lack of blood flow through the heart.  The interpreting physician stated it was a low risk study.  Thank you

## 2020-04-12 NOTE — Telephone Encounter (Signed)
Patient called requesting to speak with Jonni Sanger in regards to his recent test results.

## 2020-04-12 NOTE — Telephone Encounter (Signed)
Patient informed. Copy sent to PCP °

## 2020-04-30 DIAGNOSIS — E1165 Type 2 diabetes mellitus with hyperglycemia: Secondary | ICD-10-CM | POA: Diagnosis not present

## 2020-04-30 DIAGNOSIS — S88112A Complete traumatic amputation at level between knee and ankle, left lower leg, initial encounter: Secondary | ICD-10-CM | POA: Diagnosis not present

## 2020-04-30 DIAGNOSIS — Z299 Encounter for prophylactic measures, unspecified: Secondary | ICD-10-CM | POA: Diagnosis not present

## 2020-04-30 DIAGNOSIS — E1142 Type 2 diabetes mellitus with diabetic polyneuropathy: Secondary | ICD-10-CM | POA: Diagnosis not present

## 2020-04-30 DIAGNOSIS — Z794 Long term (current) use of insulin: Secondary | ICD-10-CM | POA: Diagnosis not present

## 2020-04-30 NOTE — Progress Notes (Signed)
Cardiology Office Note  Date: 05/01/2020   ID: [name], [name] March 22, 1960, MRN [id number]  PCP:  Ralph Leyden, FNP  Cardiologist:  No primary care provider on file. Electrophysiologist:  None   Chief Complaint: ER follow-up [name] for chest pain   History of Present Illness: [name] is a 60 y.o. 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  Cardiac catheterizations in 2005 and 2012 normal coronaries.  History of TIA, HTN, HLD, IDDM.  Last saw Dr. Bronson Ing 04/02/2018.  Denied any symptoms of chest pain palpitations, shortness of breath, lightheadedness dizziness, swelling, orthopnea, PND, or syncope.  His primary complaints were related to neuropathy in bilateral hands and feet.  Patient was continuing his Ranexa for presumed microvascular angina, blood pressure was controlled on current therapy.  He continued his atorvastatin for hyperlipidemia.     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  He has a left leg prosthesis.  Following with Dr. Theador Hawthorne nephrology in [place].  He has periodic lab work from both primary care provider and nephrologist.    Recent emergency room visit on 03/27/2020.  Experienced some some chest pain.  Described as sharp and relatively constant.  Worse with movement.  No shortness of breath, nausea, no vomiting.  Case was discussed with Dr. Domenic Polite cardiology.  Reviewed chest x-ray and EKG and agreed with plan to discharge.  Troponin was flat x2.  Lipase was normal.  BMP with mildly elevated.  blood sugar.  BUN/creatinine improved from baseline.  CBC without leukocytosis or anemia.  D-dimer elevated.  CT showed no evidence of pulmonary embolism.   He is here today for follow-up status post recent Lexiscan stress test for continuation of chest pain..The study was considered normal.  There was an inferior defect most consistent with  subdiaphragmatic attenuation.  Cannot completely exclude per my inferior infarct.  Either finding would support low risk.  This was considered a low risk study.  Patient today states he believes most of his pain is due to his neck he has had neck surgeries and multiple back surgeries before.  He denies any anginal or exertional symptoms, palpitations or arrhythmias, orthostatic symptoms, PND, orthopnea, bleeding.  Denies any claudication in the right leg.  He has a prosthetic left leg.  Blood pressure is elevated today however recent blood pressure at another physician's office was 122/83.  He states his hemoglobin A1c is improving.  States previously was 8.2% and at recent clinic visit it had improved to 7.3%.   Past Medical History:  Diagnosis Date  . Allergic rhinitis, cause unspecified   . Anginal pain ([place])   . Arthritis   . BPH (benign prostatic hyperplasia)   . Cervicalgia   . Chest pain Sept. 2005   multiple caths  /  cath 06/15/2010..normal coronaries,  EF 60%   (false postive nuclear in the past)  . CKD (chronic kidney disease), stage III ([place])   . Complication of anesthesia    Hard time waking up patient stated low blood pressures around 2017  . Dyslipidemia    mixed  . Esophageal reflux   . Fibromyalgia   . Gastroparesis   . Headache(784.0)   . History of kidney stones   . Hyperlipidemia   . Hypertension   . Hypoplasia of one kidney   . IDDM (insulin dependent diabetes mellitus)   . Lumbago   . Neuropathy associated with endocrine disorder ([place])   .  Overweight(278.02)   . Peripheral vascular disease ([place])   . Pulmonary embolus ([facility])    Hx of small left lower lobe  pulmonary embolus  . Pulmonary embolus ([place]) 2010ish  . Renal insufficiency   . Stroke [facility])    mini stroke aug 2016  . Type II or unspecified type diabetes mellitus with neurological manifestations, not stated as uncontrolled(250.60)   . Urticaria, unspecified   . Wears glasses     Past Surgical History:   Procedure Laterality Date  . ACHILLES TENDON REPAIR Left 2013  . AMPUTATION Left 06/02/2013   Procedure: AMPUTATION 1ST TOE LEFT FOOT;  Surgeon: Marcheta Grammes, DPM;  Location: [facility];  Service: Orthopedics;  Laterality: Left;  . AMPUTATION Left 07/21/2013   Procedure: PARTIAL AMPUTATION 2ND TOE LEFT FOOT;  Surgeon: Marcheta Grammes, DPM;  Location: [facility];  Service: Podiatry;  Laterality: Left;  . AMPUTATION Left 01/12/2019   Procedure: LEFT BELOW KNEE AMPUTATION;  Surgeon: Newt Minion, MD;  Location: [facility];  Service: Orthopedics;  Laterality: Left;  . ANTERIOR CERVICAL DECOMP/DISCECTOMY FUSION N/A 12/29/2016   Procedure: Cervical five-six Cervical six-seven Anterior cervical discectomy with fusion and plate fixation;  Surgeon: Ditty, Kevan Ny, MD;  Location: [facility];  Service: Neurosurgery;  Laterality: N/A;  . APPENDECTOMY    . BACK SURGERY  1999   x 6 some fusions  . BELOW KNEE LEG AMPUTATION Left 01/2019  . BIOPSY  12/18/2017   Procedure: BIOPSY;  Surgeon: Rogene Houston, MD;  [facility];  Service: Endoscopy;;  gastric and esophagus  . colonscopy    . ESOPHAGOGASTRODUODENOSCOPY (EGD) WITH PROPOFOL N/A 12/18/2017   Procedure: ESOPHAGOGASTRODUODENOSCOPY (EGD) WITH PROPOFOL;  Surgeon: Rogene Houston, MD;  [facility];  Service: Endoscopy;  Laterality: N/A;  9:25  . FOOT ARTHRODESIS Right 01/11/2014   Procedure: ARTHRODESIS INTERPHALANGEAL JOINT HALLUX RIGHT FOOT;  Surgeon: Marcheta Grammes, DPM;  Location: [facility];  Service: Podiatry;  Laterality: Right;  . HARDWARE REMOVAL Right 01/17/2015   Procedure: HARDWARE REMOVAL;  Surgeon: Caprice Beaver, DPM;  Location: [facility];  Service: Podiatry;  Laterality: Right;  . I & D EXTREMITY Left 12/22/2018   Procedure: LEFT PARTIAL CALCANEAL EXCISION;  Surgeon: Newt Minion, MD;  Location: [facility];  Service: Orthopedics;  Laterality: Left;  . KNEE ARTHROSCOPY Right 10/2015  . LUMBAR  LAMINECTOMY/DECOMPRESSION MICRODISCECTOMY Right 01/12/2013   Procedure: Right Lumbar Three-Four Laminotomy/Foraminotomy;  Surgeon: Floyce Stakes, MD;  [facility];  Service: Neurosurgery;  Laterality: Right;  Right Lumbar Three-Four Laminotomy/Foraminotomy  . NISSEN FUNDOPLICATION    . SHOULDER ARTHROSCOPY W/ ROTATOR CUFF REPAIR Right   . SHOULDER ARTHROSCOPY WITH DISTAL CLAVICLE RESECTION Right 10/04/2018   Procedure: RIGHT SHOULDER ARTHROSCOPY WITH DISTAL CLAVICLE RESECTION, EXTENSIVE DEBRIDEMENT, SUBACROMIAL PARTIAL ACROMIOPLASTY,;  Surgeon: Earlie Server, MD;  Location: [facility];  Service: Orthopedics;  Laterality: Right;  PRE/POST OP SCALENE    Current Outpatient Medications  Medication Sig Dispense Refill  . atorvastatin (LIPITOR) 40 MG tablet Take 1 tablet (40 mg total) by mouth daily. 30 tablet 11  . EPINEPHrine 0.3 mg/0.3 mL IJ SOAJ injection Inject 0.3 mg into the muscle as needed for anaphylaxis.    [name] FARXIGA 10 MG TABS tablet Take 10 mg by mouth daily.    [name] gabapentin (NEURONTIN) 600 MG tablet 1 tab by mouth every morning & 3 tabs at bedtime    . glipiZIDE (GLUCOTROL) 10 MG tablet Take 10 mg by  mouth daily.    . insulin detemir (LEVEMIR) 100 UNIT/ML injection Inject 67 Units into the skin at bedtime.    [name] omega-3 acid ethyl esters (LOVAZA) 1 g capsule Take 1 g by mouth daily.     . ranolazine (RANEXA) 1000 MG SR tablet Take 1 tablet (1,000 mg total) by mouth daily. 30 tablet 11  . sodium bicarbonate 650 MG tablet Take 650 mg by mouth 2 (two) times daily.     . isosorbide mononitrate (IMDUR) 30 MG 24 hr tablet Take 0.5 tablets (15 mg total) by mouth daily. (Patient not taking: Reported on 05/01/2020) 15 tablet 6   No current facility-administered medications for this visit.   Allergies:  Yellow jacket venom [bee venom] and Reglan [metoclopramide]   Social History: The patient  reports that he quit smoking about 37 years ago. His smoking use included  cigarettes. He started smoking about 42 years ago. He has a 15.00 pack-year smoking history. He has never used smokeless tobacco. He reports that he does not drink alcohol and does not use drugs.   Family History: The patient's family history includes Diabetes in his mother; Healthy in his brother; Heart attack (age of onset: 59) in his father; Heart attack (age of onset: 69) in his mother; Hyperlipidemia in his father and mother; Hypertension in his father and mother; Seizures in his brother.   ROS:  Please see the history of present illness. Otherwise, complete review of systems is positive for none.  All other systems are reviewed and negative.   Physical Exam: VS:  BP 140/88   Pulse 64   Ht '6\' 5"'$  (1.956 m)   Wt 282 lb 6.4 oz (128.1 kg)   SpO2 95%   BMI 33.49 kg/m , BMI Body mass index is 33.49 kg/m.  Wt Readings from Last 3 Encounters:  05/01/20 282 lb 6.4 oz (128.1 kg)  04/03/20 279 lb (126.6 kg)  06/01/19 264 lb 12.8 oz (120.1 kg)    General: Patient appears comfortable at rest. Neck: Supple, no elevated JVP or carotid bruits, no thyromegaly. Lungs: Clear to auscultation, nonlabored breathing at rest. Cardiac: Regular rate and rhythm, no S3 or significant systolic murmur, no pericardial rub. Extremities: No pitting edema, distal pulses 1+ on right.  Recent BKA to left leg secondary to severe osteomyelitis from nonhealing diabetic ulcer. Has a prosthetic leg. Skin: Warm and dry. Musculoskeletal: No kyphosis. Neuropsychiatric: Alert and oriented x3, affect grossly appropriate.  ECG:  An ECG dated 04/06/2018 was personally reviewed today and demonstrated:  Sinus tachycardia rate of 105  Recent Labwork: 03/27/2020: ALT 34; AST 35; BUN 28; Creatinine, Ser 1.78; Hemoglobin 14.9; Platelets 143; Potassium 4.1; Sodium 136     Component Value Date/Time   CHOL 151 03/14/2017 1658   TRIG 111 03/14/2017 1658   HDL 33 (L) 03/14/2017 1658   CHOLHDL 4.6 03/14/2017 1658   VLDL 22 03/14/2017  1658   LDLCALC 96 03/14/2017 1658    Other Studies Reviewed Today:  Lexiscan Myoview 04/10/2020 Study Result  Narrative & Impression   There was no ST segment deviation noted during stress.  The study is normal. Inferior defect most consistent with subdiaphragmatic attenuation. Cannot completely exclude prior mild inferior infarct. Either finding would support low risk.  This is a low risk study.  The left ventricular ejection fraction is normal (55-65%).      Echocardiogram: 03/2017 Study Conclusions  - Left ventricle: Inferobasal hypokinesis The cavity size was normal. There was mild focal basal hypertrophy  of the septum. Systolic function was normal. The estimated ejection fraction was in the range of 55% to 60%. Wall motion was normal; there were no regional wall motion abnormalities. Doppler parameters are consistent with abnormal left ventricular relaxation (grade 1 diastolic dysfunction). - Mitral valve: Valve area by pressure half-time: 1.55 cm^2. - Left atrium: The atrium was mildly dilated.  Cardiac Catheterization: 06/2010 ANGIOGRAPHIC DATA: The right coronary artery arises somewhat anteriorly. It is a small nondominant vessel and is normal.  The left main coronary artery is normal.  The left anterior descending artery is a large vessel and is normal.  There is a moderate ramus intermediate branch which is normal.  Left circumflex coronary artery is a dominant vessel. There is minor narrowing of the third obtuse marginal vessel up to 40%. Otherwise, the left circumflex system appears normal.  Left ventricular angiography performed in RAO view demonstrates normal left ventricular size and contractility with ejection fraction estimated at 60%.  FINAL INTERPRETATION: 1. No significant obstructive atherosclerotic coronary artery disease. 2. Normal left ventricular function  Assessment and Plan:   1. Chest pain, unspecified  type He denies any recent progressive anginal or exertional symptoms.  Continue aspirin 81 mg, ranolazine 1000 mg SR daily.    2. Essential hypertension Blood pressure is elevated today at 140/88.  However he states that her recent PCP visit it was 122/83.  States his blood pressure is always up at the doctor's office.  Not on antihypertensive medications at this time.  3. Hyperlipidemia LDL goal <70 No recent lipid panels from PCP office.  We will attempt to obtain those results continue atorvastatin 40 mg daily.  Please refill atorvastatin 40 mg .  Continue omega-3 1 g daily.  Recent lipid panel from PCP office collected on 05/09/2019 showed total cholesterol 167, triglycerides 237, HDL 31, LDL 96  4. Type 2 diabetes mellitus with vascular disease ([place]) Managed by PCP.  Patient is currently taking glipizide 10 mg daily, insulin detemir 67 units at bedtime. States he was recently started on Farxiga 10 mg by primary care.  States his recent hemoglobin A1c has improved from 8.2% down to 7.3%.    Medication Adjustments/Labs and Tests Ordered: Current medicines are reviewed at length with the patient today.  Concerns regarding medicines are outlined above.   Disposition: Follow-up with Dr. Harl Bowie or APP 6 months.  Signed, Levell July, NP 05/01/2020 8:31 AM     [facility], [place], [place] [address] Phone: [phone]; Fax: [phone]

## 2020-05-01 ENCOUNTER — Ambulatory Visit (INDEPENDENT_AMBULATORY_CARE_PROVIDER_SITE_OTHER): Payer: Medicare HMO | Admitting: Family Medicine

## 2020-05-01 ENCOUNTER — Encounter: Payer: Self-pay | Admitting: Family Medicine

## 2020-05-01 VITALS — BP 140/88 | HR 64 | Ht 77.0 in | Wt 282.4 lb

## 2020-05-01 DIAGNOSIS — Z794 Long term (current) use of insulin: Secondary | ICD-10-CM

## 2020-05-01 DIAGNOSIS — E119 Type 2 diabetes mellitus without complications: Secondary | ICD-10-CM | POA: Diagnosis not present

## 2020-05-01 DIAGNOSIS — R079 Chest pain, unspecified: Secondary | ICD-10-CM

## 2020-05-01 DIAGNOSIS — I10 Essential (primary) hypertension: Secondary | ICD-10-CM

## 2020-05-01 DIAGNOSIS — E782 Mixed hyperlipidemia: Secondary | ICD-10-CM

## 2020-05-01 NOTE — Patient Instructions (Signed)
Medication Instructions:  Continue all current medications.   Labwork: none  Testing/Procedures: none  Follow-Up: 6 months   Any Other Special Instructions Will Be Listed Below (If Applicable).   If you need a refill on your cardiac medications before your next appointment, please call your pharmacy.  

## 2020-05-03 DIAGNOSIS — E1165 Type 2 diabetes mellitus with hyperglycemia: Secondary | ICD-10-CM | POA: Diagnosis not present

## 2020-05-09 DIAGNOSIS — E1122 Type 2 diabetes mellitus with diabetic chronic kidney disease: Secondary | ICD-10-CM | POA: Diagnosis not present

## 2020-05-09 DIAGNOSIS — N186 End stage renal disease: Secondary | ICD-10-CM | POA: Diagnosis not present

## 2020-05-09 DIAGNOSIS — N200 Calculus of kidney: Secondary | ICD-10-CM | POA: Diagnosis not present

## 2020-05-09 DIAGNOSIS — I129 Hypertensive chronic kidney disease with stage 1 through stage 4 chronic kidney disease, or unspecified chronic kidney disease: Secondary | ICD-10-CM | POA: Diagnosis not present

## 2020-05-09 DIAGNOSIS — E6609 Other obesity due to excess calories: Secondary | ICD-10-CM | POA: Diagnosis not present

## 2020-05-09 DIAGNOSIS — N189 Chronic kidney disease, unspecified: Secondary | ICD-10-CM | POA: Diagnosis not present

## 2020-05-12 ENCOUNTER — Other Ambulatory Visit: Payer: Self-pay

## 2020-05-12 ENCOUNTER — Emergency Department (HOSPITAL_COMMUNITY)
Admission: EM | Admit: 2020-05-12 | Discharge: 2020-05-12 | Disposition: A | Discharge status: Home / Self Care | Payer: Medicare HMO | Attending: Emergency Medicine | Admitting: Emergency Medicine

## 2020-05-12 ENCOUNTER — Encounter (HOSPITAL_COMMUNITY): Payer: Self-pay | Admitting: Emergency Medicine

## 2020-05-12 ENCOUNTER — Emergency Department (HOSPITAL_COMMUNITY): Payer: Medicare HMO

## 2020-05-12 DIAGNOSIS — Z981 Arthrodesis status: Secondary | ICD-10-CM | POA: Diagnosis not present

## 2020-05-12 DIAGNOSIS — M545 Low back pain, unspecified: Secondary | ICD-10-CM | POA: Diagnosis not present

## 2020-05-12 DIAGNOSIS — I129 Hypertensive chronic kidney disease with stage 1 through stage 4 chronic kidney disease, or unspecified chronic kidney disease: Secondary | ICD-10-CM | POA: Diagnosis not present

## 2020-05-12 DIAGNOSIS — Z794 Long term (current) use of insulin: Secondary | ICD-10-CM | POA: Insufficient documentation

## 2020-05-12 DIAGNOSIS — Z7984 Long term (current) use of oral hypoglycemic drugs: Secondary | ICD-10-CM | POA: Insufficient documentation

## 2020-05-12 DIAGNOSIS — I700 Atherosclerosis of aorta: Secondary | ICD-10-CM | POA: Diagnosis not present

## 2020-05-12 DIAGNOSIS — Z79899 Other long term (current) drug therapy: Secondary | ICD-10-CM | POA: Diagnosis not present

## 2020-05-12 DIAGNOSIS — Z87891 Personal history of nicotine dependence: Secondary | ICD-10-CM | POA: Diagnosis not present

## 2020-05-12 DIAGNOSIS — N200 Calculus of kidney: Secondary | ICD-10-CM | POA: Diagnosis not present

## 2020-05-12 DIAGNOSIS — N183 Chronic kidney disease, stage 3 unspecified: Secondary | ICD-10-CM | POA: Diagnosis not present

## 2020-05-12 DIAGNOSIS — K529 Noninfective gastroenteritis and colitis, unspecified: Secondary | ICD-10-CM | POA: Diagnosis not present

## 2020-05-12 DIAGNOSIS — R109 Unspecified abdominal pain: Secondary | ICD-10-CM | POA: Insufficient documentation

## 2020-05-12 DIAGNOSIS — M546 Pain in thoracic spine: Secondary | ICD-10-CM | POA: Diagnosis not present

## 2020-05-12 DIAGNOSIS — E1122 Type 2 diabetes mellitus with diabetic chronic kidney disease: Secondary | ICD-10-CM | POA: Diagnosis not present

## 2020-05-12 LAB — CBC WITH DIFFERENTIAL/PLATELET
Abs Immature Granulocytes: 0.03 10*3/uL (ref 0.00–0.07)
Basophils Absolute: 0.1 10*3/uL (ref 0.0–0.1)
Basophils Relative: 1 %
Eosinophils Absolute: 0.4 10*3/uL (ref 0.0–0.5)
Eosinophils Relative: 4 %
HCT: 46.4 % (ref 39.0–52.0)
Hemoglobin: 15.8 g/dL (ref 13.0–17.0)
Immature Granulocytes: 0 %
Lymphocytes Relative: 33 %
Lymphs Abs: 3 10*3/uL (ref 0.7–4.0)
MCH: 30 pg (ref 26.0–34.0)
MCHC: 34.1 g/dL (ref 30.0–36.0)
MCV: 88 fL (ref 80.0–100.0)
Monocytes Absolute: 0.9 10*3/uL (ref 0.1–1.0)
Monocytes Relative: 10 %
Neutro Abs: 4.7 10*3/uL (ref 1.7–7.7)
Neutrophils Relative %: 52 %
Platelets: 157 10*3/uL (ref 150–400)
RBC: 5.27 MIL/uL (ref 4.22–5.81)
RDW: 13.9 % (ref 11.5–15.5)
WBC: 9 10*3/uL (ref 4.0–10.5)
nRBC: 0 % (ref 0.0–0.2)

## 2020-05-12 LAB — BASIC METABOLIC PANEL
Anion gap: 12 (ref 5–15)
BUN: 41 mg/dL — ABNORMAL HIGH (ref 6–20)
CO2: 20 mmol/L — ABNORMAL LOW (ref 22–32)
Calcium: 9.1 mg/dL (ref 8.9–10.3)
Chloride: 106 mmol/L (ref 98–111)
Creatinine, Ser: 2.48 mg/dL — ABNORMAL HIGH (ref 0.61–1.24)
GFR, Estimated: 29 mL/min — ABNORMAL LOW (ref >60)
Glucose, Bld: 179 mg/dL — ABNORMAL HIGH (ref 70–99)
Potassium: 4 mmol/L (ref 3.5–5.1)
Sodium: 138 mmol/L (ref 135–145)

## 2020-05-12 MED ORDER — ACETAMINOPHEN 325 MG PO TABS: 650.0000 mg | ORAL_TABLET | Freq: Four times a day (QID) | ORAL | 0 refills | Status: DC | PRN

## 2020-05-12 MED ORDER — HYDROMORPHONE HCL 1 MG/ML IJ SOLN
1.0000 mg | Freq: Once | INTRAMUSCULAR | Status: AC
Start: 2020-05-12 — End: 2020-05-12
  Administered 2020-05-12: 1 mg via INTRAVENOUS
  Filled 2020-05-12: qty 1

## 2020-05-12 MED ORDER — KETOROLAC TROMETHAMINE 30 MG/ML IJ SOLN
30.0000 mg | Freq: Once | INTRAMUSCULAR | Status: AC
  Administered 2020-05-12: 30 mg via INTRAVENOUS
  Filled 2020-05-12: qty 1

## 2020-05-12 MED ORDER — OXYCODONE HCL 5 MG PO TABS: 5.0000 mg | ORAL_TABLET | Freq: Four times a day (QID) | ORAL | 0 refills | Status: DC | PRN

## 2020-05-12 MED ORDER — MORPHINE SULFATE (PF) 4 MG/ML IV SOLN
4.0000 mg | Freq: Once | INTRAVENOUS | Status: AC
  Administered 2020-05-12: 4 mg via INTRAVENOUS
  Filled 2020-05-12: qty 1

## 2020-05-12 NOTE — Discharge Instructions (Signed)
You may have a herniated disc or a problem in your spine or mid back, with a muscle spasm.  I prescribed some pain pills she can use at home for the next several days.  I would advise that you start with Tylenol.  If your pain is severe you can take 5 to 10 mg of oxycodone, once every 6 hours.  Do not mix this medication with muscle relaxers or any other sedating medicines.  It will make you tired.  Do not drive after taking oxycodone.  Please call and schedule follow-up appoint with your primary care doctor.  We do not prescribe long-term pain medicine from the emergency department.  Your doctor can help you set up with pain management plan to refer you to a specialist if they feel you do need to see one.  You can also use over-the-counter muscle rubs on your back, which can help with the spasms.

## 2020-05-12 NOTE — ED Triage Notes (Signed)
Pt c/o right flank pain that began today at 1400. Denies urinary symptoms.

## 2020-05-12 NOTE — ED Provider Notes (Signed)
[facility] EMERGENCY DEPARTMENT Provider Note   CSN: [id number] Arrival date & time: 05/12/20  1828     History CC:  Back pain  [name] is a 60 y.o. male with history of diabetes, kidney disease, presenting to the emergency department with acute onset of right lower back pain.  He reports it began around 2 PM while at a [place].  He denies any falls or trauma preceding this.  He describes severe pain in his right flank and lower back.  It is 10/10.  He has never had this before.  The pain has been constant.  He does feel mildly nauseous, no vomiting.  The pain is worse with movement, better when sitting still.    He denies any prior history of kidney stones.  He reports a history of gastric surgery many years ago, no other abdominal surgeries.  He does report a history of "five back surgeries," including L spine, but none recently.   No fevers, chills. No radiculopathy into legs.  HPI     Past Medical History:  Diagnosis Date  . Allergic rhinitis, cause unspecified   . Anginal pain ([place])   . Arthritis   . BPH (benign prostatic hyperplasia)   . Cervicalgia   . Chest pain Sept. 2005   multiple caths  /  cath 06/15/2010..normal coronaries,  EF 60%   (false postive nuclear in the past)  . CKD (chronic kidney disease), stage III ([place])   . Complication of anesthesia    Hard time waking up patient stated low blood pressures around 2017  . Dyslipidemia    mixed  . Esophageal reflux   . Fibromyalgia   . Gastroparesis   . Headache(784.0)   . History of kidney stones   . Hyperlipidemia   . Hypertension   . Hypoplasia of one kidney   . IDDM (insulin dependent diabetes mellitus)   . Lumbago   . Neuropathy associated with endocrine disorder ([place])   . Overweight(278.02)   . Peripheral vascular disease ([place])   . Pulmonary embolus (HCC)    Hx of small left lower lobe  pulmonary embolus  . Pulmonary embolus ([place]) 2010ish  . Renal insufficiency   . Stroke [facility])     mini stroke aug 2016  . Type II or unspecified type diabetes mellitus with neurological manifestations, not stated as uncontrolled(250.60)   . Urticaria, unspecified   . Wears glasses     Patient Active Problem List   Diagnosis Date Noted  . Gangrene of left foot ([place]) 01/12/2019  . Subacute osteomyelitis, left ankle and foot ([place])   . Acute osteomyelitis of left calcaneus (HCC)   . Ulcer of left heel and midfoot with necrosis of bone ([place])   . Gastroesophageal reflux disease without esophagitis 11/09/2017  . Abdominal pain, chronic, epigastric 11/09/2017  . Numbness   . Syncope 03/13/2017  . H/O cervical spine surgery 03/13/2017  . Cervical spondylosis with radiculopathy 12/29/2016  . CVA (cerebral vascular accident) ([place]) 08/30/2014  . Diabetes with skin complication ([place]) 123XX123  . Neuropathy 08/30/2014  . AKI (acute kidney injury) ([place]) 08/30/2014  . Bilateral occipital neuralgia 10/30/2013  . Lumbar stenosis without neurogenic claudication 01/12/2013  . Type 2 diabetes mellitus with vascular disease ([place])   . Hypertension   . Hyperlipidemia   . Gastroparesis   . Pulmonary embolus ([place]) 2012   . Obesity   . GERD 01/14/2010  . Chest pain 10/05/2003    Past Surgical History:  Procedure Laterality  Date  . ACHILLES TENDON REPAIR Left 2013  . AMPUTATION Left 06/02/2013   Procedure: AMPUTATION 1ST TOE LEFT FOOT;  Surgeon: Marcheta Grammes, DPM;  Location: [facility];  Service: Orthopedics;  Laterality: Left;  . AMPUTATION Left 07/21/2013   Procedure: PARTIAL AMPUTATION 2ND TOE LEFT FOOT;  Surgeon: Marcheta Grammes, DPM;  Location: [facility];  Service: Podiatry;  Laterality: Left;  . AMPUTATION Left 01/12/2019   Procedure: LEFT BELOW KNEE AMPUTATION;  Surgeon: Newt Minion, MD;  Location: [place];  Service: Orthopedics;  Laterality: Left;  . ANTERIOR CERVICAL DECOMP/DISCECTOMY FUSION N/A 12/29/2016   Procedure: Cervical five-six Cervical six-seven Anterior cervical  discectomy with fusion and plate fixation;  Surgeon: Ditty, Kevan Ny, MD;  Location: [place];  Service: Neurosurgery;  Laterality: N/A;  . APPENDECTOMY    . BACK SURGERY  1999   x 6 some fusions  . BELOW KNEE LEG AMPUTATION Left 01/2019  . BIOPSY  12/18/2017   Procedure: BIOPSY;  Surgeon: Rogene Houston, MD;  [facility];  Service: Endoscopy;;  gastric and esophagus  . colonscopy    . ESOPHAGOGASTRODUODENOSCOPY (EGD) WITH PROPOFOL N/A 12/18/2017   Procedure: ESOPHAGOGASTRODUODENOSCOPY (EGD) WITH PROPOFOL;  Surgeon: Rogene Houston, MD;  [facility];  Service: Endoscopy;  Laterality: N/A;  9:25  . FOOT ARTHRODESIS Right 01/11/2014   Procedure: ARTHRODESIS INTERPHALANGEAL JOINT HALLUX RIGHT FOOT;  Surgeon: Marcheta Grammes, DPM;  Location: [facility];  Service: Podiatry;  Laterality: Right;  . HARDWARE REMOVAL Right 01/17/2015   Procedure: HARDWARE REMOVAL;  Surgeon: Caprice Beaver, DPM;  Location: [facility];  Service: Podiatry;  Laterality: Right;  . I & D EXTREMITY Left 12/22/2018   Procedure: LEFT PARTIAL CALCANEAL EXCISION;  Surgeon: Newt Minion, MD;  Location: [place];  Service: Orthopedics;  Laterality: Left;  . KNEE ARTHROSCOPY Right 10/2015  . LUMBAR LAMINECTOMY/DECOMPRESSION MICRODISCECTOMY Right 01/12/2013   Procedure: Right Lumbar Three-Four Laminotomy/Foraminotomy;  Surgeon: Floyce Stakes, MD;  [facility];  Service: Neurosurgery;  Laterality: Right;  Right Lumbar Three-Four Laminotomy/Foraminotomy  . NISSEN FUNDOPLICATION    . SHOULDER ARTHROSCOPY W/ ROTATOR CUFF REPAIR Right   . SHOULDER ARTHROSCOPY WITH DISTAL CLAVICLE RESECTION Right 10/04/2018   Procedure: RIGHT SHOULDER ARTHROSCOPY WITH DISTAL CLAVICLE RESECTION, EXTENSIVE DEBRIDEMENT, SUBACROMIAL PARTIAL ACROMIOPLASTY,;  Surgeon: Earlie Server, MD;  Location: [place];  Service: Orthopedics;  Laterality: Right;  PRE/POST OP SCALENE       Family History   Problem Relation Age of Onset  . Heart attack Father 42  . Hyperlipidemia Father   . Hypertension Father   . Heart attack Mother 50  . Diabetes Mother   . Hypertension Mother   . Hyperlipidemia Mother   . Healthy Brother   . Seizures Brother   . Migraines Neg Hx     Social History   Tobacco Use  . Smoking status: Former Smoker    Packs/day: 3.00    Years: 5.00    Pack years: 15.00    Types: Cigarettes    Start date: 06/20/1977    Quit date: 07/20/1982    Years since quitting: 37.8  . Smokeless tobacco: Never Used  . Tobacco comment: quit 1980's  Vaping Use  . Vaping Use: Never used  Substance Use Topics  . Alcohol use: No    Alcohol/week: 0.0 standard drinks  . Drug use: No    Home Medications Prior to Admission medications   Medication Sig Start Date End Date  Taking? Authorizing Provider  acetaminophen (TYLENOL) 325 MG tablet Take 2 tablets (650 mg total) by mouth every 6 (six) hours as needed for up to 30 doses for mild pain or moderate pain. 05/12/20  Yes Shawne Eskelson, Carola Rhine, MD  oxyCODONE (ROXICODONE) 5 MG immediate release tablet Take 1 tablet (5 mg total) by mouth every 6 (six) hours as needed for up to 20 doses for severe pain. 05/12/20  Yes Wyvonnia Dusky, MD  atorvastatin (LIPITOR) 40 MG tablet Take 1 tablet (40 mg total) by mouth daily. 06/01/19   Verta Ellen., NP  EPINEPHrine 0.3 mg/0.3 mL IJ SOAJ injection Inject 0.3 mg into the muscle as needed for anaphylaxis.    [provider]  FARXIGA 10 MG TABS tablet Take 10 mg by mouth daily. 03/13/20   [provider]  gabapentin (NEURONTIN) 600 MG tablet 1 tab by mouth every morning & 3 tabs at bedtime    [provider]  glipiZIDE (GLUCOTROL) 10 MG tablet Take 10 mg by mouth daily.    [provider]  insulin detemir (LEVEMIR) 100 UNIT/ML injection Inject 67 Units into the skin at bedtime.    [provider]  isosorbide mononitrate (IMDUR) 30 MG 24 hr tablet Take 0.5  tablets (15 mg total) by mouth daily. Patient not taking: Reported on 05/01/2020 04/03/20   Verta Ellen., NP  omega-3 acid ethyl esters (LOVAZA) 1 g capsule Take 1 g by mouth daily.  03/10/18   [provider]  ranolazine (RANEXA) 1000 MG SR tablet Take 1 tablet (1,000 mg total) by mouth daily. 06/01/19   Verta Ellen., NP  sodium bicarbonate 650 MG tablet Take 650 mg by mouth 2 (two) times daily.     [provider]    Allergies    Yellow jacket venom [bee venom] and Reglan [metoclopramide]  Review of Systems   Review of Systems  Constitutional: Negative for chills and fever.  Respiratory: Negative for cough and shortness of breath.   Cardiovascular: Negative for chest pain and palpitations.  Gastrointestinal: Positive for nausea. Negative for vomiting.  Genitourinary: Positive for flank pain. Negative for dysuria and hematuria.  Musculoskeletal: Positive for back pain. Negative for arthralgias.  Skin: Negative for color change and rash.  Neurological: Negative for weakness, numbness and headaches.  Psychiatric/Behavioral: Negative for agitation and confusion.  All other systems reviewed and are negative.   Physical Exam Updated Vital Signs BP 123/80 (BP Location: Right Arm)   Pulse 69   Temp 98.4 F (36.9 C) (Oral)   Resp 18   Ht '6\' 5"'$  (1.956 m)   Wt 120.2 kg   SpO2 98%   BMI 31.42 kg/m   Physical Exam Constitutional:      General: He is not in acute distress.    Appearance: He is obese.  HENT:     Head: Normocephalic and atraumatic.  Eyes:     Conjunctiva/sclera: Conjunctivae normal.     Pupils: Pupils are equal, round, and reactive to light.  Cardiovascular:     Rate and Rhythm: Normal rate and regular rhythm.  Pulmonary:     Effort: Pulmonary effort is normal. No respiratory distress.  Abdominal:     General: There is no distension.     Tenderness: There is no abdominal tenderness. Negative signs include Murphy's sign and McBurney's  sign.  Musculoskeletal:     Comments: Right BKA Paraspinal back muscle tenderness and spasm, right sided, mid-thoracic back  Skin:  General: Skin is warm and dry.  Neurological:     General: No focal deficit present.     Mental Status: He is alert and oriented to person, place, and time. Mental status is at baseline.     Sensory: No sensory deficit.     Motor: No weakness.  Psychiatric:        Mood and Affect: Mood normal.        Behavior: Behavior normal.     ED Results / Procedures / Treatments   Labs (all labs ordered are listed, but only abnormal results are displayed) Labs Reviewed  BASIC METABOLIC PANEL - Abnormal; Notable for the following components:      Result Value   CO2 20 (*)    Glucose, Bld 179 (*)    BUN 41 (*)    Creatinine, Ser 2.48 (*)    GFR, Estimated 29 (*)    All other components within normal limits  CBC WITH DIFFERENTIAL/PLATELET    EKG None  Radiology CT Renal Stone Study  Result Date: 05/12/2020 CLINICAL DATA:  Right-sided flank pain EXAM: CT ABDOMEN AND PELVIS WITHOUT CONTRAST TECHNIQUE: Multidetector CT imaging of the abdomen and pelvis was performed following the standard protocol without IV contrast. COMPARISON:  CT 06/30/2019 FINDINGS: Lower chest: Lung bases demonstrate no acute consolidation or effusion. Normal cardiac size. Hepatobiliary: No focal liver abnormality is seen. No gallstones, gallbladder wall thickening, or biliary dilatation. Pancreas: Unremarkable. No pancreatic ductal dilatation or surrounding inflammatory changes. Spleen: Normal in size without focal abnormality. Adrenals/Urinary Tract: Adrenal glands are normal. Punctate intrarenal stones on the right. No hydronephrosis or ureteral stone. The bladder is normal. Stomach/Bowel: Stomach is within normal limits. Appendix not seen but no right lower quadrant inflammatory process. No evidence of bowel wall thickening, distention, or inflammatory changes. Vascular/Lymphatic: Mild  aortic atherosclerosis. No aneurysm. No suspicious nodes. Reproductive: Prostate is unremarkable. Other: Negative for free air or free fluid. Musculoskeletal: Post fusion changes L4 and L5. No acute osseous abnormality. IMPRESSION: 1. Punctate intrarenal stones on the right. No hydronephrosis or ureteral stone. 2. No CT evidence for acute intra-abdominal or pelvic abnormality. Aortic Atherosclerosis (ICD10-I70.0). Electronically Signed   By: Donavan Foil M.D.   On: 05/12/2020 21:50    Procedures Procedures   Medications Ordered in ED Medications  morphine 4 MG/ML injection 4 mg (4 mg Intravenous Given 05/12/20 1935)  ketorolac (TORADOL) 30 MG/ML injection 30 mg (30 mg Intravenous Given 05/12/20 1936)  HYDROmorphone (DILAUDID) injection 1 mg (1 mg Intravenous Given 05/12/20 2225)    ED Course  I have reviewed the triage vital signs and the nursing notes.  Pertinent labs & imaging results that were available during my care of the patient were reviewed by me and considered in my medical decision making (see chart for details).  This patient presents to the Emergency Department with complaint of abdominal pain. This involves an extensive number of treatment options, and is a complaint that carries with it a high risk of complications and morbidity.  The differential diagnosis includes, but is not limited to, T/L spine disc herniation/radiculopathy vs kidney stone/ureteral colic vs UTI vs colitis vs other  No immediate risk factors, signs or symptoms for spinal epidural abscess or infection, meningitis, or cauda equina syndrome.  I ordered, reviewed, and interpreted labs, including BMP and CBC.  There were no immediate, life-threatening emergencies found in this labwork.  Cr close to baseline with CKD, minor worsening - he has upcoming nephro appointment.  WBC normal. Hgb  normal. I ordered medication IV morphine, IV toradol (single dose) for pain I ordered imaging studies which included CT renal stone  study I independently visualized and interpreted imaging which showed ureteral stones, no hydronephrosis or perinephric stranding and the monitor tracing which showed NSR  After the interventions stated above, I reevaluated the patient and found that they remained clinically stable.  Based on the patient's clinical exam, vital signs, risk factors, and ED testing, I felt that the patient's overall risk of life-threatening emergency such as bowel perforation, surgical emergency, or sepsis was quite low.  I suspect this clinical presentation is most consistent with T/L spine disc herniation with paraspinal muscle spasm, but explained to the patient that this evaluation was not a definitive diagnostic workup.   I discussed return precautions with the patient. I felt the patient was clinically stable for discharge.   Clinical Course as of 05/13/20 [id number]  Sat May 12, 2020  2125 We discussed his kidney disease.  He has an upcoming appointment with his nephrologist.  Will need to avoid NSAIDs ongoing at home. [MT]  2215 On reassessment, his pain continues to be reproducible with movement and palpation.  I now suspect that he may have a disc herniation or facet pain of his mid thoracic back, with associated paraspinal muscle tenderness and spasm.  No evidence of kidney stone on his CT scan.  Her pain options are limited.  Discussed with him and his wife a course of treatment with oxycodone and Tylenol at home.  They will need to follow-up with his PCP.  Okay for discharge. [MT]    Clinical Course User Index [MT] Wyvonnia Dusky, MD   Final Clinical Impression(s) / ED Diagnoses Final diagnoses:  Acute thoracic back pain, unspecified back pain laterality    Rx / DC Orders ED Discharge Orders         Ordered    oxyCODONE (ROXICODONE) 5 MG immediate release tablet  Every 6 hours PRN        05/12/20 2217    acetaminophen (TYLENOL) 325 MG tablet  Every 6 hours PRN        05/12/20 2217            Wyvonnia Dusky, MD 05/13/20 [phone]

## 2020-05-21 DIAGNOSIS — Z299 Encounter for prophylactic measures, unspecified: Secondary | ICD-10-CM | POA: Diagnosis not present

## 2020-05-21 DIAGNOSIS — Z794 Long term (current) use of insulin: Secondary | ICD-10-CM | POA: Diagnosis not present

## 2020-05-21 DIAGNOSIS — G8929 Other chronic pain: Secondary | ICD-10-CM | POA: Diagnosis not present

## 2020-05-21 DIAGNOSIS — E1165 Type 2 diabetes mellitus with hyperglycemia: Secondary | ICD-10-CM | POA: Diagnosis not present

## 2020-05-21 DIAGNOSIS — M549 Dorsalgia, unspecified: Secondary | ICD-10-CM | POA: Diagnosis not present

## 2020-05-30 DIAGNOSIS — I129 Hypertensive chronic kidney disease with stage 1 through stage 4 chronic kidney disease, or unspecified chronic kidney disease: Secondary | ICD-10-CM | POA: Diagnosis not present

## 2020-05-30 DIAGNOSIS — E6609 Other obesity due to excess calories: Secondary | ICD-10-CM | POA: Diagnosis not present

## 2020-05-30 DIAGNOSIS — E211 Secondary hyperparathyroidism, not elsewhere classified: Secondary | ICD-10-CM | POA: Diagnosis not present

## 2020-05-30 DIAGNOSIS — N189 Chronic kidney disease, unspecified: Secondary | ICD-10-CM | POA: Diagnosis not present

## 2020-05-30 DIAGNOSIS — E1122 Type 2 diabetes mellitus with diabetic chronic kidney disease: Secondary | ICD-10-CM | POA: Diagnosis not present

## 2020-05-30 DIAGNOSIS — E872 Acidosis: Secondary | ICD-10-CM | POA: Diagnosis not present

## 2020-05-31 ENCOUNTER — Encounter: Payer: Self-pay | Admitting: Cardiology

## 2020-05-31 DIAGNOSIS — R972 Elevated prostate specific antigen [PSA]: Secondary | ICD-10-CM | POA: Diagnosis not present

## 2020-05-31 DIAGNOSIS — E785 Hyperlipidemia, unspecified: Secondary | ICD-10-CM | POA: Diagnosis not present

## 2020-05-31 DIAGNOSIS — Z7189 Other specified counseling: Secondary | ICD-10-CM | POA: Diagnosis not present

## 2020-05-31 DIAGNOSIS — Z1339 Encounter for screening examination for other mental health and behavioral disorders: Secondary | ICD-10-CM | POA: Diagnosis not present

## 2020-05-31 DIAGNOSIS — Z0000 Encounter for general adult medical examination without abnormal findings: Secondary | ICD-10-CM | POA: Diagnosis not present

## 2020-05-31 DIAGNOSIS — Z299 Encounter for prophylactic measures, unspecified: Secondary | ICD-10-CM | POA: Diagnosis not present

## 2020-05-31 DIAGNOSIS — Z1331 Encounter for screening for depression: Secondary | ICD-10-CM | POA: Diagnosis not present

## 2020-05-31 DIAGNOSIS — Z125 Encounter for screening for malignant neoplasm of prostate: Secondary | ICD-10-CM | POA: Diagnosis not present

## 2020-05-31 DIAGNOSIS — Z87891 Personal history of nicotine dependence: Secondary | ICD-10-CM | POA: Diagnosis not present

## 2020-05-31 DIAGNOSIS — Z6834 Body mass index (BMI) 34.0-34.9, adult: Secondary | ICD-10-CM | POA: Diagnosis not present

## 2020-05-31 DIAGNOSIS — R5383 Other fatigue: Secondary | ICD-10-CM | POA: Diagnosis not present

## 2020-05-31 DIAGNOSIS — Z1211 Encounter for screening for malignant neoplasm of colon: Secondary | ICD-10-CM | POA: Diagnosis not present

## 2020-05-31 DIAGNOSIS — Z79899 Other long term (current) drug therapy: Secondary | ICD-10-CM | POA: Diagnosis not present

## 2020-06-05 ENCOUNTER — Other Ambulatory Visit: Payer: Self-pay | Admitting: Family Medicine

## 2020-06-26 DIAGNOSIS — J40 Bronchitis, not specified as acute or chronic: Secondary | ICD-10-CM | POA: Diagnosis not present

## 2020-06-26 DIAGNOSIS — Z6834 Body mass index (BMI) 34.0-34.9, adult: Secondary | ICD-10-CM | POA: Diagnosis not present

## 2020-06-26 DIAGNOSIS — E1165 Type 2 diabetes mellitus with hyperglycemia: Secondary | ICD-10-CM | POA: Diagnosis not present

## 2020-06-26 DIAGNOSIS — Z794 Long term (current) use of insulin: Secondary | ICD-10-CM | POA: Diagnosis not present

## 2020-06-26 DIAGNOSIS — Z87891 Personal history of nicotine dependence: Secondary | ICD-10-CM | POA: Diagnosis not present

## 2020-06-26 DIAGNOSIS — Z299 Encounter for prophylactic measures, unspecified: Secondary | ICD-10-CM | POA: Diagnosis not present

## 2020-07-11 DIAGNOSIS — Z299 Encounter for prophylactic measures, unspecified: Secondary | ICD-10-CM | POA: Diagnosis not present

## 2020-07-11 DIAGNOSIS — E1165 Type 2 diabetes mellitus with hyperglycemia: Secondary | ICD-10-CM | POA: Diagnosis not present

## 2020-07-11 DIAGNOSIS — Z87891 Personal history of nicotine dependence: Secondary | ICD-10-CM | POA: Diagnosis not present

## 2020-07-11 DIAGNOSIS — R972 Elevated prostate specific antigen [PSA]: Secondary | ICD-10-CM | POA: Diagnosis not present

## 2020-07-11 DIAGNOSIS — Z6835 Body mass index (BMI) 35.0-35.9, adult: Secondary | ICD-10-CM | POA: Diagnosis not present

## 2020-07-19 DIAGNOSIS — R972 Elevated prostate specific antigen [PSA]: Secondary | ICD-10-CM | POA: Diagnosis not present

## 2020-08-07 DIAGNOSIS — Z299 Encounter for prophylactic measures, unspecified: Secondary | ICD-10-CM | POA: Diagnosis not present

## 2020-08-07 DIAGNOSIS — R972 Elevated prostate specific antigen [PSA]: Secondary | ICD-10-CM | POA: Diagnosis not present

## 2020-08-07 DIAGNOSIS — R030 Elevated blood-pressure reading, without diagnosis of hypertension: Secondary | ICD-10-CM | POA: Diagnosis not present

## 2020-08-07 DIAGNOSIS — E1140 Type 2 diabetes mellitus with diabetic neuropathy, unspecified: Secondary | ICD-10-CM | POA: Diagnosis not present

## 2020-08-07 DIAGNOSIS — Z6834 Body mass index (BMI) 34.0-34.9, adult: Secondary | ICD-10-CM | POA: Diagnosis not present

## 2020-08-07 DIAGNOSIS — N183 Chronic kidney disease, stage 3 unspecified: Secondary | ICD-10-CM | POA: Diagnosis not present

## 2020-08-07 DIAGNOSIS — E1122 Type 2 diabetes mellitus with diabetic chronic kidney disease: Secondary | ICD-10-CM | POA: Diagnosis not present

## 2020-08-07 DIAGNOSIS — E1165 Type 2 diabetes mellitus with hyperglycemia: Secondary | ICD-10-CM | POA: Diagnosis not present

## 2020-08-27 DIAGNOSIS — N189 Chronic kidney disease, unspecified: Secondary | ICD-10-CM | POA: Diagnosis not present

## 2020-08-30 DIAGNOSIS — I129 Hypertensive chronic kidney disease with stage 1 through stage 4 chronic kidney disease, or unspecified chronic kidney disease: Secondary | ICD-10-CM | POA: Diagnosis not present

## 2020-08-30 DIAGNOSIS — E6609 Other obesity due to excess calories: Secondary | ICD-10-CM | POA: Diagnosis not present

## 2020-08-30 DIAGNOSIS — E1122 Type 2 diabetes mellitus with diabetic chronic kidney disease: Secondary | ICD-10-CM | POA: Diagnosis not present

## 2020-08-30 DIAGNOSIS — N189 Chronic kidney disease, unspecified: Secondary | ICD-10-CM | POA: Diagnosis not present

## 2020-08-30 DIAGNOSIS — E211 Secondary hyperparathyroidism, not elsewhere classified: Secondary | ICD-10-CM | POA: Diagnosis not present

## 2020-09-27 DIAGNOSIS — R972 Elevated prostate specific antigen [PSA]: Secondary | ICD-10-CM | POA: Diagnosis not present

## 2020-10-04 DIAGNOSIS — R972 Elevated prostate specific antigen [PSA]: Secondary | ICD-10-CM | POA: Diagnosis not present

## 2020-10-09 ENCOUNTER — Other Ambulatory Visit: Payer: Self-pay | Admitting: Podiatry

## 2020-10-09 ENCOUNTER — Ambulatory Visit: Payer: Medicare HMO | Admitting: Podiatry

## 2020-10-09 ENCOUNTER — Other Ambulatory Visit: Payer: Self-pay

## 2020-10-09 ENCOUNTER — Ambulatory Visit (INDEPENDENT_AMBULATORY_CARE_PROVIDER_SITE_OTHER): Payer: Medicare HMO

## 2020-10-09 DIAGNOSIS — I739 Peripheral vascular disease, unspecified: Secondary | ICD-10-CM | POA: Diagnosis not present

## 2020-10-09 DIAGNOSIS — L97511 Non-pressure chronic ulcer of other part of right foot limited to breakdown of skin: Secondary | ICD-10-CM

## 2020-10-09 DIAGNOSIS — M7989 Other specified soft tissue disorders: Secondary | ICD-10-CM

## 2020-10-09 MED ORDER — SILVER SULFADIAZINE 1 % EX CREA: TOPICAL_CREAM | CUTANEOUS | 0 refills | Status: AC

## 2020-10-09 MED ORDER — DOXYCYCLINE HYCLATE 100 MG PO TABS: 100.0000 mg | ORAL_TABLET | Freq: Two times a day (BID) | ORAL | 0 refills | Status: DC

## 2020-10-09 NOTE — Progress Notes (Signed)
  Subjective:  Patient ID: [name], male    DOB: 11-11-1960,  MRN: [id number]  Chief Complaint  Patient presents with   Toe Pain    Pt states his right great toe has had swelling. Pt states toe got very swollen and skin begin to peel off big toe. Pt states he had lots of blood and  he wrapped toe for treatment.     60 y.o. male presents with the above complaint. History confirmed with patient. History of BKA left lower extremity.  Objective:  Physical Exam: warm, good capillary refill, no trophic changes or ulcerative lesions, normal DP and PT pulses, and normal sensory exam. DP pulse palpable, PT pulse non-palpable  Left: BKA Right Foot: right great toe wound plantar medial hallux with surrounding bulla with entrapped blood. Upon debridement small 0.3cm diameter granular wound with mild periwound erythema. No probe to bone.    No images are attached to the encounter.  Radiographs: X-ray of the right foot: no fracture, dislocation,noted; and no soft tissue emphysema, no signs of osteomyelitis Assessment:   1. Ulcer of toe of right foot, limited to breakdown of skin ([place])   2. PAD (peripheral artery disease) ([place])    Plan:  Patient was evaluated and treated and all questions answered.  Ulcer toe right foot, breakdown of skin -XR reviewed with patient. -Gentle debridement of wound -Rx doxycycline -Rx silvadene -Surgical shoe dispensed for offloading  PAD -Non-invasive vascular studies ordered.  Return in about 2 weeks (around 10/23/2020) for Wound Care.

## 2020-10-15 ENCOUNTER — Ambulatory Visit (HOSPITAL_COMMUNITY)
Admission: RE | Admit: 2020-10-15 | Discharge: 2020-10-15 | Disposition: A | Discharge status: Home / Self Care | Payer: Medicare HMO | Source: Ambulatory Visit | Attending: Cardiology | Admitting: Cardiology

## 2020-10-15 ENCOUNTER — Other Ambulatory Visit: Payer: Self-pay

## 2020-10-15 DIAGNOSIS — L97511 Non-pressure chronic ulcer of other part of right foot limited to breakdown of skin: Secondary | ICD-10-CM | POA: Diagnosis not present

## 2020-10-22 ENCOUNTER — Other Ambulatory Visit: Payer: Self-pay | Admitting: Podiatry

## 2020-10-22 DIAGNOSIS — L97511 Non-pressure chronic ulcer of other part of right foot limited to breakdown of skin: Secondary | ICD-10-CM

## 2020-10-23 ENCOUNTER — Other Ambulatory Visit: Payer: Self-pay

## 2020-10-23 ENCOUNTER — Ambulatory Visit: Payer: Medicare HMO | Admitting: Podiatry

## 2020-10-23 DIAGNOSIS — I739 Peripheral vascular disease, unspecified: Secondary | ICD-10-CM

## 2020-10-23 DIAGNOSIS — L97511 Non-pressure chronic ulcer of other part of right foot limited to breakdown of skin: Secondary | ICD-10-CM | POA: Diagnosis not present

## 2020-10-23 NOTE — Progress Notes (Signed)
Subjective:  Patient ID: [name], male    DOB: [birth year],  MRN: [id number]  Chief Complaint  Patient presents with   Diabetic Ulcer    Wound care, right foot diabetic ulcer    60 y.o. male presents with the above complaint. History confirmed with patient. History of BKA left lower extremity. Has been wearing his sneaker despite being put into the surgical shoe.  Objective:  Physical Exam: warm, good capillary refill, no trophic changes or ulcerative lesions, normal DP and PT pulses, and normal sensory exam. DP pulse palpable, PT pulse non-palpable  Left: BKA Right Foot: right great toe wound plantar medial hallux with surrounding bulla with entrapped blood. Upon debridement small 0.2cm diameter granular wound without warmth or periwound erythema. No probe to bone.      Study Result    LOWER EXTREMITY DOPPLER STUDY   Patient Name:  [name]  Date of Exam:   10/15/2020  Medical Rec #: [id number]         Accession #:    [id number]  Date of Birth: 08-Jan-1961         Patient Gender: M  Patient Age:   47 years  Exam Location:  [facility]  Procedure:      VAS [place] LOWER EXT ART SEG MULTI (SEGMENTALS & LE RAYNAUDS)  Referring Phys: Corwyn Vora    ---------------------------------------------------------------------------  -----     Indications: Left BKA. Patient referred from wound care for small  ulceration on               the right great toe. Previous left BKA in 123456 for  complications               from diabetic ulcer and osteomyelitis. Patient denies any               claudication symptoms in the right leg, says he does get  cramps in               the leg at night.   High Risk Factors: Hyperlipidemia, Diabetes, past history of smoking.     Vascular Interventions: Left BKA in 12/20.   Performing Technologist: Mariane Masters RVT      Examination Guidelines: A complete evaluation includes at minimum, Doppler  waveform signals and systolic blood  pressure reading at the level of  bilateral  brachial, anterior tibial, and posterior tibial arteries, when vessel  segments  are accessible. Bilateral testing is considered an integral part of a  complete  examination. Photoelectric Plethysmograph (PPG) waveforms and toe systolic  pressure readings are included as required and additional duplex testing  as  needed. Limited examinations for reoccurring indications may be performed  as  noted.      ABI Findings:  +---------+------------------+-----+---------+--------+  Right    Rt Pressure (mmHg)IndexWaveform Comment   +---------+------------------+-----+---------+--------+  Brachial 152                                       +---------+------------------+-----+---------+--------+  CFA                             triphasic          +---------+------------------+-----+---------+--------+  Popliteal                       triphasic          +---------+------------------+-----+---------+--------+  PTA      196               1.29 triphasic          +---------+------------------+-----+---------+--------+  PERO     189               1.24 triphasic          +---------+------------------+-----+---------+--------+  DP       185               1.22 triphasic          +---------+------------------+-----+---------+--------+  Great Toe                       Normal             +---------+------------------+-----+---------+--------+   +--------+------------------+-----+---------+-------+  Left    Lt Pressure (mmHg)IndexWaveform Comment  +--------+------------------+-----+---------+-------+  Brachial150                                      +--------+------------------+-----+---------+-------+  CFA                            triphasic         +--------+------------------+-----+---------+-------+        TOES Findings:  +----------+---------------+--------+-------+  Right  ToesPressure (mmHg)WaveformComment  +----------+---------------+--------+-------+  1st Digit                Normal           +----------+---------------+--------+-------+  2nd Digit                Normal           +----------+---------------+--------+-------+  3rd Digit                Normal           +----------+---------------+--------+-------+  4th Digit                Normal           +----------+---------------+--------+-------+  5th Digit                Normal           +----------+---------------+--------+-------+               Right toe PPG tracings display appropriate pulsatility. Unable to obtain  great toe pressure due to toe girth     Summary:  Right: Resting right ankle-brachial index is within normal range. No  evidence of significant right lower extremity arterial disease.  Left below-knee amputation.       Assessment:   1. Ulcer of toe of right foot, limited to breakdown of skin [facility]    Plan:  Patient was evaluated and treated and all questions answered.  Ulcer toe right foot, breakdown of skin -Improving, gently debrided today. Dressed with silvadene, gauze, kerlix, tape. Continue dressing daily. Should the wound be healed in two weeks he can trial getting into his normal shoegear. We may have to modify his insert. No active erythema today, much improved. No need for additional abx.  PAD -Reviewed arterial studies. Should be sufficient for healing.  No follow-ups on file.

## 2020-10-25 DIAGNOSIS — N189 Chronic kidney disease, unspecified: Secondary | ICD-10-CM | POA: Diagnosis not present

## 2020-10-25 DIAGNOSIS — N186 End stage renal disease: Secondary | ICD-10-CM | POA: Diagnosis not present

## 2020-10-25 DIAGNOSIS — E1122 Type 2 diabetes mellitus with diabetic chronic kidney disease: Secondary | ICD-10-CM | POA: Diagnosis not present

## 2020-10-25 DIAGNOSIS — E6609 Other obesity due to excess calories: Secondary | ICD-10-CM | POA: Diagnosis not present

## 2020-10-25 DIAGNOSIS — E211 Secondary hyperparathyroidism, not elsewhere classified: Secondary | ICD-10-CM | POA: Diagnosis not present

## 2020-10-31 DIAGNOSIS — E211 Secondary hyperparathyroidism, not elsewhere classified: Secondary | ICD-10-CM | POA: Diagnosis not present

## 2020-10-31 DIAGNOSIS — I129 Hypertensive chronic kidney disease with stage 1 through stage 4 chronic kidney disease, or unspecified chronic kidney disease: Secondary | ICD-10-CM | POA: Diagnosis not present

## 2020-10-31 DIAGNOSIS — N189 Chronic kidney disease, unspecified: Secondary | ICD-10-CM | POA: Diagnosis not present

## 2020-10-31 DIAGNOSIS — E1122 Type 2 diabetes mellitus with diabetic chronic kidney disease: Secondary | ICD-10-CM | POA: Diagnosis not present

## 2020-10-31 DIAGNOSIS — E872 Acidosis: Secondary | ICD-10-CM | POA: Diagnosis not present

## 2020-10-31 DIAGNOSIS — E6609 Other obesity due to excess calories: Secondary | ICD-10-CM | POA: Diagnosis not present

## 2020-11-01 ENCOUNTER — Encounter: Payer: Self-pay | Admitting: *Deleted

## 2020-11-01 ENCOUNTER — Ambulatory Visit (INDEPENDENT_AMBULATORY_CARE_PROVIDER_SITE_OTHER): Payer: Medicare HMO | Admitting: Cardiology

## 2020-11-01 ENCOUNTER — Encounter: Payer: Self-pay | Admitting: Cardiology

## 2020-11-01 VITALS — BP 140/88 | HR 60 | Ht 77.0 in | Wt 272.0 lb

## 2020-11-01 DIAGNOSIS — Z8673 Personal history of transient ischemic attack (TIA), and cerebral infarction without residual deficits: Secondary | ICD-10-CM

## 2020-11-01 DIAGNOSIS — R079 Chest pain, unspecified: Secondary | ICD-10-CM

## 2020-11-01 MED ORDER — RANOLAZINE ER 1000 MG PO TB12
1000.0000 mg | ORAL_TABLET | Freq: Two times a day (BID) | ORAL | 6 refills | Status: DC
Start: 2020-11-01 — End: 2021-06-14

## 2020-11-01 NOTE — Progress Notes (Signed)
Clinical Summary [name] is a 60 y.o.male former patient of Dr Bronson Ing, this is our first visit together. 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 2005 and 2012 caths without epicardial disease - 04/2020 nuclear stress: no ischemia  - no significant chest pains.  - compliant with meds   2. HTN - normal bp yesterday with neprhology   3. Hyperlipidemia - labs followed by pcp  4. History of TIA - off ASA per renal according to patient -   5. DM2   Past Medical History:  Diagnosis Date   Allergic rhinitis, cause unspecified    Anginal pain ([place])    Arthritis    BPH (benign prostatic hyperplasia)    Cervicalgia    Chest pain Sept. 2005   multiple caths  /  cath 06/15/2010..normal coronaries,  EF 60%   (false postive nuclear in the past)   CKD (chronic kidney disease), stage III (HCC)    Complication of anesthesia    Hard time waking up patient stated low blood pressures around 2017   Dyslipidemia    mixed   Esophageal reflux    Fibromyalgia    Gastroparesis    Headache(784.0)    History of kidney stones    Hyperlipidemia    Hypertension    Hypoplasia of one kidney    IDDM (insulin dependent diabetes mellitus)    Lumbago    Neuropathy associated with endocrine disorder ([place])    Overweight(278.02)    Peripheral vascular disease ([place])    Pulmonary embolus (HCC)    Hx of small left lower lobe  pulmonary embolus   Pulmonary embolus ([place]) 2010ish   Renal insufficiency    Stroke [facility])    mini stroke aug 2016   Type II or unspecified type diabetes mellitus with neurological manifestations, not stated as uncontrolled(250.60)    Urticaria, unspecified    Wears glasses      Allergies  Allergen Reactions   Bee Venom Swelling   Reglan [Metoclopramide] Itching and Rash     Current Outpatient Medications  Medication Sig Dispense Refill   acetaminophen (TYLENOL) 325 MG tablet Take 2 tablets (650 mg  total) by mouth every 6 (six) hours as needed for up to 30 doses for mild pain or moderate pain. 30 tablet 0   atorvastatin (LIPITOR) 40 MG tablet TAKE 1 TABLET BY MOUTH EVERY DAY 30 tablet 11   ciprofloxacin (CIPRO) 500 MG tablet Take 500 mg by mouth 2 (two) times daily.     dapagliflozin propanediol (FARXIGA) 10 MG TABS tablet Take by mouth.     doxycycline (VIBRA-TABS) 100 MG tablet Take 1 tablet (100 mg total) by mouth 2 (two) times daily. 14 tablet 0   EPINEPHrine 0.3 mg/0.3 mL IJ SOAJ injection Inject 0.3 mg into the muscle as needed for anaphylaxis.     EPINEPHrine 0.3 mg/0.3 mL IJ SOAJ injection Inject into the muscle.     gabapentin (NEURONTIN) 600 MG tablet 1 tab by mouth every morning & 3 tabs at bedtime     glipiZIDE (GLUCOTROL) 10 MG tablet Take 10 mg by mouth daily.     insulin detemir (LEVEMIR) 100 UNIT/ML injection Inject into the skin.     isosorbide mononitrate (IMDUR) 30 MG 24 hr tablet Take 0.5 tablets (15 mg total) by mouth daily. (Patient not taking: Reported on 05/01/2020) 15 tablet 6   Lancets (ONETOUCH DELICA PLUS 123XX123) MISC daily.     levofloxacin (LEVAQUIN)  750 MG tablet Take 750 mg by mouth every morning.     Multiple Vitamin (MULTIVITAMIN) capsule Take by mouth.     Omega 3 1000 MG CAPS Take by mouth.     omega-3 acid ethyl esters (LOVAZA) 1 g capsule Take 1 g by mouth daily.      ONETOUCH ULTRA test strip USE TO TEST BLOOD SUGAR THREE TIMES DAILY     oxyCODONE (ROXICODONE) 5 MG immediate release tablet Take 1 tablet (5 mg total) by mouth every 6 (six) hours as needed for up to 20 doses for severe pain. 20 tablet 0   ranolazine (RANEXA) 1000 MG SR tablet Take by mouth.     silver sulfADIAZINE (SILVADENE) 1 % cream Apply pea-sized amount to wound daily. 50 g 0   sodium bicarbonate 650 MG tablet Take 650 mg by mouth 2 (two) times daily.      sodium bicarbonate 650 MG tablet Take 1 tablet by mouth 3 (three) times daily.     No current facility-administered  medications for this visit.     Past Surgical History:  Procedure Laterality Date   ACHILLES TENDON REPAIR Left 2013   AMPUTATION Left 06/02/2013   Procedure: AMPUTATION 1ST TOE LEFT FOOT;  Surgeon: Marcheta Grammes, DPM;  Location: [facility];  Service: Orthopedics;  Laterality: Left;   AMPUTATION Left 07/21/2013   Procedure: PARTIAL AMPUTATION 2ND TOE LEFT FOOT;  Surgeon: Marcheta Grammes, DPM;  Location: [facility];  Service: Podiatry;  Laterality: Left;   AMPUTATION Left 01/12/2019   Procedure: LEFT BELOW KNEE AMPUTATION;  Surgeon: Newt Minion, MD;  Location: [facility];  Service: Orthopedics;  Laterality: Left;   ANTERIOR CERVICAL DECOMP/DISCECTOMY FUSION N/A 12/29/2016   Procedure: Cervical five-six Cervical six-seven Anterior cervical discectomy with fusion and plate fixation;  Surgeon: Ditty, Kevan Ny, MD;  Location: [facility];  Service: Neurosurgery;  Laterality: N/A;   APPENDECTOMY     BACK SURGERY  1999   x 6 some fusions   BELOW KNEE LEG AMPUTATION Left 01/2019   BIOPSY  12/18/2017   Procedure: BIOPSY;  Surgeon: Rogene Houston, MD;  [facility];  Service: Endoscopy;;  gastric and esophagus   colonscopy     ESOPHAGOGASTRODUODENOSCOPY (EGD) WITH PROPOFOL N/A 12/18/2017   Procedure: ESOPHAGOGASTRODUODENOSCOPY (EGD) WITH PROPOFOL;  Surgeon: Rogene Houston, MD;  [facility];  Service: Endoscopy;  Laterality: N/A;  9:25   FOOT ARTHRODESIS Right 01/11/2014   Procedure: ARTHRODESIS INTERPHALANGEAL JOINT HALLUX RIGHT FOOT;  Surgeon: Marcheta Grammes, DPM;  Location: [facility];  Service: Podiatry;  Laterality: Right;   HARDWARE REMOVAL Right 01/17/2015   Procedure: HARDWARE REMOVAL;  Surgeon: Caprice Beaver, DPM;  Location: [facility];  Service: Podiatry;  Laterality: Right;   I & D EXTREMITY Left 12/22/2018   Procedure: LEFT PARTIAL CALCANEAL EXCISION;  Surgeon: Newt Minion, MD;  Location: [facility];  Service: Orthopedics;  Laterality: Left;   KNEE  ARTHROSCOPY Right 10/2015   LUMBAR LAMINECTOMY/DECOMPRESSION MICRODISCECTOMY Right 01/12/2013   Procedure: Right Lumbar Three-Four Laminotomy/Foraminotomy;  Surgeon: Floyce Stakes, MD;  [facility];  Service: Neurosurgery;  Laterality: Right;  Right Lumbar Three-Four Laminotomy/Foraminotomy   NISSEN FUNDOPLICATION     SHOULDER ARTHROSCOPY W/ ROTATOR CUFF REPAIR Right    SHOULDER ARTHROSCOPY WITH DISTAL CLAVICLE RESECTION Right 10/04/2018   Procedure: RIGHT SHOULDER ARTHROSCOPY WITH DISTAL CLAVICLE RESECTION, EXTENSIVE DEBRIDEMENT, SUBACROMIAL PARTIAL ACROMIOPLASTY,;  Surgeon: Earlie Server, MD;  Location: [facility];  Service: Orthopedics;  Laterality: Right;  PRE/POST OP SCALENE     Allergies  Allergen Reactions   Bee Venom Swelling   Reglan [Metoclopramide] Itching and Rash      Family History  Problem Relation Age of Onset   Heart attack Father 59   Hyperlipidemia Father    Hypertension Father    Heart attack Mother 81   Diabetes Mother    Hypertension Mother    Hyperlipidemia Mother    Healthy Brother    Seizures Brother    Migraines Neg Hx      Social History Mr. Trosper reports that he quit smoking about 38 years ago. His smoking use included cigarettes. He started smoking about 43 years ago. He has a 15.00 pack-year smoking history. He has never used smokeless tobacco. Mr. Gaster reports no history of alcohol use.   Review of Systems CONSTITUTIONAL: No weight loss, fever, chills, weakness or fatigue.  HEENT: Eyes: No visual loss, blurred vision, double vision or yellow sclerae.No hearing loss, sneezing, congestion, runny nose or sore throat.  SKIN: No rash or itching.  CARDIOVASCULAR: per hpi RESPIRATORY: No shortness of breath, cough or sputum.  GASTROINTESTINAL: No anorexia, nausea, vomiting or diarrhea. No abdominal pain or blood.  GENITOURINARY: No burning on urination, no polyuria NEUROLOGICAL: No headache, dizziness,  syncope, paralysis, ataxia, numbness or tingling in the extremities. No change in bowel or bladder control.  MUSCULOSKELETAL: No muscle, back pain, joint pain or stiffness.  LYMPHATICS: No enlarged nodes. No history of splenectomy.  PSYCHIATRIC: No history of depression or anxiety.  ENDOCRINOLOGIC: No reports of sweating, cold or heat intolerance. No polyuria or polydipsia.  [name]   Physical Examination Today's Vitals   11/01/20 0839  BP: 140/88  Pulse: 60  SpO2: 98%  Weight: 272 lb (123.4 kg)  Height: '6\' 5"'$  (1.956 m)   Body mass index is 32.25 kg/m.  Gen: resting comfortably, no acute distress HEENT: no scleral icterus, pupils equal round and reactive, no palptable cervical adenopathy,  CV: RRR, no m/r/g no jvd Resp: Clear to auscultation bilaterally GI: abdomen is soft, non-tender, non-distended, normal bowel sounds, no hepatosplenomegaly MSK: extremities are warm, no edema.  Skin: warm, no rash Neuro:  no focal deficits Psych: appropriate affect   Diagnostic Studies  Echocardiogram: 03/2017 Study Conclusions   - Left ventricle: Inferobasal hypokinesis The cavity size was   normal. There was mild focal basal hypertrophy of the septum.   Systolic function was normal. The estimated ejection fraction was   in the range of 55% to 60%. Wall motion was normal; there were no   regional wall motion abnormalities. Doppler parameters are   consistent with abnormal left ventricular relaxation (grade 1   diastolic dysfunction). - Mitral valve: Valve area by pressure half-time: 1.55 cm^2. - Left atrium: The atrium was mildly dilated.   Cardiac Catheterization: 06/2010 ANGIOGRAPHIC DATA:  The right coronary artery arises somewhat anteriorly.  It is a small nondominant vessel and is normal.   The left main coronary artery is normal.   The left anterior descending artery is a large vessel and is normal.   There is a moderate ramus intermediate Kamon Fahr which is normal.   Left  circumflex coronary artery is a dominant vessel.  There is minor narrowing of the third obtuse marginal vessel up to 40%.  Otherwise, the left circumflex system appears normal.   Left ventricular angiography performed in RAO view demonstrates normal left ventricular size and contractility with ejection fraction estimated at 60%.  FINAL INTERPRETATION: 1. No significant obstructive atherosclerotic coronary artery disease. 2. Normal left ventricular function.   04/2020 nuclear stress There was no ST segment deviation noted during stress. The study is normal. Inferior defect most consistent with subdiaphragmatic attenuation. Cannot completely exclude prior mild inferior infarct. Either finding would support low risk. This is a low risk study. The left ventricular ejection fraction is normal (55-65%).  Assessment and Plan   1.Chest pain -long history of symptoms with benign caths and more recent nuclear stress test - has been treated for possible microvascular disease with ranexa. He is no longer on imdur. Ranexa should be '1000mg'$  bid. - no recent symptoms, continue to monitor  2. History of TIA - appears he is off ASA, he reports his nephrologist had advised to stop due to his kidney function. Will confirm with Dr Theador Hawthorne, if cannot be on ASA would use plavix as alternative       Arnoldo Lenis, M.D

## 2020-11-01 NOTE — Patient Instructions (Addendum)
Medication Instructions:  Ranexa refilled today.  Continue all other medications.     Labwork: none  Testing/Procedures: none  Follow-Up: 6 months   Any Other Special Instructions Will Be Listed Below (If Applicable).  11/02/2020 - per Dr. Harl Bowie - after his discussion with Dr. Theador Hawthorne - patient may restart his ASA at '81mg'$  daily.  --  11:28 --agh - Patient notified and verbalized understanding.   If you need a refill on your cardiac medications before your next appointment, please call your pharmacy.

## 2020-11-02 MED ORDER — ASPIRIN EC 81 MG PO TBEC: 81.0000 mg | DELAYED_RELEASE_TABLET | Freq: Every day | ORAL | Status: AC

## 2020-11-02 NOTE — Addendum Note (Signed)
Addended by: Laurine Blazer on: 11/02/2020 11:40 AM   Modules accepted: Orders

## 2020-11-09 DIAGNOSIS — J40 Bronchitis, not specified as acute or chronic: Secondary | ICD-10-CM | POA: Diagnosis not present

## 2020-11-09 DIAGNOSIS — E1165 Type 2 diabetes mellitus with hyperglycemia: Secondary | ICD-10-CM | POA: Diagnosis not present

## 2020-11-09 DIAGNOSIS — L918 Other hypertrophic disorders of the skin: Secondary | ICD-10-CM | POA: Diagnosis not present

## 2020-11-09 DIAGNOSIS — Z6834 Body mass index (BMI) 34.0-34.9, adult: Secondary | ICD-10-CM | POA: Diagnosis not present

## 2020-11-09 DIAGNOSIS — M549 Dorsalgia, unspecified: Secondary | ICD-10-CM | POA: Diagnosis not present

## 2020-11-09 DIAGNOSIS — Z789 Other specified health status: Secondary | ICD-10-CM | POA: Diagnosis not present

## 2020-11-09 DIAGNOSIS — Z299 Encounter for prophylactic measures, unspecified: Secondary | ICD-10-CM | POA: Diagnosis not present

## 2020-11-16 ENCOUNTER — Ambulatory Visit: Payer: Medicare HMO | Admitting: Podiatry

## 2020-11-16 ENCOUNTER — Other Ambulatory Visit: Payer: Self-pay

## 2020-11-16 DIAGNOSIS — L97511 Non-pressure chronic ulcer of other part of right foot limited to breakdown of skin: Secondary | ICD-10-CM

## 2020-11-16 DIAGNOSIS — L600 Ingrowing nail: Secondary | ICD-10-CM

## 2020-11-16 NOTE — Progress Notes (Signed)
  Subjective:  Patient ID: [name], male    DOB: Sep 29, 1960,  MRN: [id number]  Chief Complaint  Patient presents with   Wound Check    Denies fever/chills/nausea/vomiting. No new concerns.    60 y.o. male presents with the above complaint. History confirmed with patient. History of BKA left lower extremity. Would like to go back to his sneaker and not the surgical shoe.  Objective:  Physical Exam: warm, good capillary refill, no trophic changes or ulcerative lesions, normal DP and PT pulses, and normal sensory exam. DP pulse palpable, PT pulse non-palpable  Left: BKA Right Foot: right great toe wound plantar medial hallux without bulla. Upon debridement small 0.4cm diameter granular wound without warmth or periwound erythema. No probe to bone.  Ingrowing nail noted without warmth, erythema right hallux Assessment:   1. Ulcer of toe of right foot, limited to breakdown of skin ([place])   2. Ingrown nail     Plan:  Patient was evaluated and treated and all questions answered.  Ulcer toe right foot, breakdown of skin -Wound about the same as previous, slightly larger. The wound was debrided today. I gave him an off the shelf diabetic insole that I modified to offload the hallux. Will trial this with normal sneaker. Advised if wound worsens he needs to let us know promptly and return to the surgical shoe.  Ingrown nail -Right hallux nail debrided in slant back fashion. No paronychia, no sign of acute infection.  Return in about 4 weeks (around 12/14/2020) for Wound Care.

## 2020-11-19 ENCOUNTER — Encounter: Payer: Self-pay | Admitting: Podiatry

## 2020-11-19 ENCOUNTER — Ambulatory Visit (INDEPENDENT_AMBULATORY_CARE_PROVIDER_SITE_OTHER): Payer: Medicare HMO | Admitting: Podiatry

## 2020-11-19 ENCOUNTER — Other Ambulatory Visit: Payer: Self-pay

## 2020-11-19 DIAGNOSIS — L97511 Non-pressure chronic ulcer of other part of right foot limited to breakdown of skin: Secondary | ICD-10-CM

## 2020-11-19 DIAGNOSIS — I739 Peripheral vascular disease, unspecified: Secondary | ICD-10-CM | POA: Diagnosis not present

## 2020-11-19 NOTE — Progress Notes (Signed)
  Subjective:  Patient ID: [name], male    DOB: 1960-09-17,  MRN: [id number]  Chief Complaint  Patient presents with   Foot Ulcer    Follow up ulcer hallux right   "It was bleeding this morning, so I was concerned about it"     60 y.o. male presents for follow-up of right toe ulcer. Relates he was seen on Friday by Dr. March Rummage but had a lot of bleeding through the bandage and was concerned. [name] History confirmed with patient. History of BKA left lower extremity. Would like to go back to his sneaker and not the surgical shoe.  Objective:  Physical Exam: warm, good capillary refill, no trophic changes or ulcerative lesions, normal DP and PT pulses, and normal sensory exam. DP pulse palpable, PT pulse non-palpable  Left: BKA Right Foot: right great toe wound plantar medial hallux without bulla. Upon debridement small 0.4cm diameter granular wound without warmth or periwound erythema. No probe to bone.  No erythema or edema noted or excessive bleeding.  Assessment:   1. Ulcer of toe of right foot, limited to breakdown of skin ([place])   2. PAD (peripheral artery disease) ([place])      Plan:  Patient was evaluated and treated and all questions answered.  Ulcer toe right foot, breakdown of skin -Wound about the same as previous. The wound was debrided today.Advised to go back into surgical shoe. Dressed ulcer up and advised to keep DSD.  Advised if wound worsens he needs to let us know promptly and return . Will follow-up with Dr. March Rummage in 4 weeks.  No follow-ups on file.   Lorenda Peck, DPM

## 2020-12-07 DIAGNOSIS — J40 Bronchitis, not specified as acute or chronic: Secondary | ICD-10-CM | POA: Diagnosis not present

## 2020-12-07 DIAGNOSIS — M549 Dorsalgia, unspecified: Secondary | ICD-10-CM | POA: Diagnosis not present

## 2020-12-07 DIAGNOSIS — Z6833 Body mass index (BMI) 33.0-33.9, adult: Secondary | ICD-10-CM | POA: Diagnosis not present

## 2020-12-07 DIAGNOSIS — E1165 Type 2 diabetes mellitus with hyperglycemia: Secondary | ICD-10-CM | POA: Diagnosis not present

## 2020-12-07 DIAGNOSIS — Z299 Encounter for prophylactic measures, unspecified: Secondary | ICD-10-CM | POA: Diagnosis not present

## 2020-12-07 DIAGNOSIS — L918 Other hypertrophic disorders of the skin: Secondary | ICD-10-CM | POA: Diagnosis not present

## 2020-12-13 DIAGNOSIS — L608 Other nail disorders: Secondary | ICD-10-CM | POA: Diagnosis not present

## 2020-12-13 DIAGNOSIS — M7989 Other specified soft tissue disorders: Secondary | ICD-10-CM | POA: Diagnosis not present

## 2020-12-13 DIAGNOSIS — Z713 Dietary counseling and surveillance: Secondary | ICD-10-CM | POA: Diagnosis not present

## 2020-12-13 DIAGNOSIS — M79642 Pain in left hand: Secondary | ICD-10-CM | POA: Diagnosis not present

## 2020-12-13 DIAGNOSIS — R519 Headache, unspecified: Secondary | ICD-10-CM | POA: Diagnosis not present

## 2020-12-13 DIAGNOSIS — Z87891 Personal history of nicotine dependence: Secondary | ICD-10-CM | POA: Diagnosis not present

## 2020-12-13 DIAGNOSIS — Z299 Encounter for prophylactic measures, unspecified: Secondary | ICD-10-CM | POA: Diagnosis not present

## 2020-12-14 ENCOUNTER — Ambulatory Visit (INDEPENDENT_AMBULATORY_CARE_PROVIDER_SITE_OTHER): Payer: Medicare HMO

## 2020-12-14 ENCOUNTER — Ambulatory Visit (INDEPENDENT_AMBULATORY_CARE_PROVIDER_SITE_OTHER): Payer: Medicare HMO | Admitting: Podiatry

## 2020-12-14 DIAGNOSIS — L97511 Non-pressure chronic ulcer of other part of right foot limited to breakdown of skin: Secondary | ICD-10-CM

## 2020-12-14 DIAGNOSIS — I739 Peripheral vascular disease, unspecified: Secondary | ICD-10-CM

## 2020-12-14 MED ORDER — DOXYCYCLINE HYCLATE 100 MG PO TABS: 100.0000 mg | ORAL_TABLET | Freq: Two times a day (BID) | ORAL | 0 refills | Status: DC

## 2020-12-17 ENCOUNTER — Ambulatory Visit (INDEPENDENT_AMBULATORY_CARE_PROVIDER_SITE_OTHER): Payer: Medicare HMO

## 2020-12-17 ENCOUNTER — Encounter: Payer: Self-pay | Admitting: Orthopedic Surgery

## 2020-12-17 ENCOUNTER — Ambulatory Visit (INDEPENDENT_AMBULATORY_CARE_PROVIDER_SITE_OTHER): Payer: Medicare HMO | Admitting: Orthopedic Surgery

## 2020-12-17 VITALS — BP 152/98 | HR 72 | Ht 77.0 in | Wt 272.0 lb

## 2020-12-17 DIAGNOSIS — M79645 Pain in left finger(s): Secondary | ICD-10-CM | POA: Diagnosis not present

## 2020-12-17 DIAGNOSIS — S6992XA Unspecified injury of left wrist, hand and finger(s), initial encounter: Secondary | ICD-10-CM | POA: Insufficient documentation

## 2020-12-17 DIAGNOSIS — L97511 Non-pressure chronic ulcer of other part of right foot limited to breakdown of skin: Secondary | ICD-10-CM | POA: Diagnosis not present

## 2020-12-17 NOTE — Progress Notes (Signed)
  Subjective:  Patient ID: [name], male    DOB: 02/16/1960,  MRN: [id number]  Chief Complaint  Patient presents with   Wound Care    Patient presents for wound care, he has no new changes to report.    60 y.o. male presents with the above complaint. History confirmed with patient. History of BKA left lower extremity. Thinks the toe is doing worse, with some swelling and redness to the digit.  Objective:  Physical Exam: warm, good capillary refill, no trophic changes or ulcerative lesions, normal DP and PT pulses, and normal sensory exam. DP pulse palpable, PT pulse non-palpable  Left: BKA Right Foot: right great toe wound plantar medial hallux without bulla. Upon debridement 0.5 diameter granular wound without warmth or periwound erythema. No probe to bone.  Toe slightly edematous and red. Assessment:   1. Ulcer of toe of right foot, limited to breakdown of skin ([place])   2. PAD (peripheral artery disease) ([place])    Plan:  Patient was evaluated and treated and all questions answered.  Ulcer toe right foot, breakdown of skin -Repeat XR without definite erosion, however given surgical changes at this area cannot rule out on plain films. -Order infectious labs  -Rx Bactrim -Dress with betadine WTD, continue daily. -Advised to return to surgical shoe for the time being -Depending upon labwork may benefit from MRI  Return in about 2 weeks (around 12/28/2020) for Wound Care.

## 2020-12-17 NOTE — Progress Notes (Signed)
Office Visit Note   Patient: [name]           Date of Birth: 08-Jun-1960           MRN: [id number] Visit Date: 12/17/2020              Requested by: Ralph Leyden, [place],  [place] [address] PCP: Ralph Leyden, FNP   Assessment & Plan: Visit Diagnoses:  1. Pain in left finger(s)   2. Injury of nail bed of finger of left hand, initial encounter     Plan: We discussed surgical versus nonsurgical management of nail bed injuries.  We discussed the increased risk of nail plate deformity with nonoperative management.  He would like to proceed with removal of the nail plate with nail bed repair.  We discussed the post procedure wound care and expected recovery.  We will plan on doing this in the office on Wednesday.   Follow-Up Instructions: No follow-ups on file.   Orders:  Orders Placed This Encounter  Procedures   XR Finger Middle Left   No orders of the defined types were placed in this encounter.     Procedures: No procedures performed   Clinical Data: No additional findings.   Subjective: Chief Complaint  Patient presents with   Left Middle Finger - Injury    DOI: 12/12/20, got finger caught in wood splitter trying to help someone else.+ numb, Pain: 7-8/10, RIGHT Handed, took dressing off this morning, + bruising of nail bed with redness at the base of the nail bed,     This is a 60 yo RHD M who presents with an injury to his left middle finger.  He got the finger caught in a wood splitter on 11/9.  He describes pain at the distal aspect of this finger.  He has a subungual hematoma involving the entire nail.  There is slight swelling of the proximal nail fold.  He is able to flex and extend at the middle finger DIP joint.   Injury   Review of Systems   Objective: Vital Signs: BP (!) 152/98 (BP Location: Left Arm, Patient Position: Sitting)   Pulse 72   Ht 6\' 5"  (1.956 m)   Wt 272 lb (123.4 kg)   BMI 32.25 kg/m   Physical  Exam Constitutional:      Appearance: Normal appearance.  Cardiovascular:     Rate and Rhythm: Normal rate.     Pulses: Normal pulses.  Pulmonary:     Effort: Pulmonary effort is normal.  Skin:    General: Skin is warm and dry.     Capillary Refill: Capillary refill takes less than 2 seconds.  Neurological:     Mental Status: He is alert.    Right Hand Exam   Tenderness  Right hand tenderness location: TTP at distal aspect of middle finger.  Other  Sensation: normal Pulse: present  Comments:  Subungual hematoma involving entire nail.  Mild swelling of proximal nail fold.  No lift off or changes in nail plate.      Specialty Comments:  No specialty comments available.  Imaging: 3V of the left middle finger taken today are reviewed and interpreted by me.  They do not demonstrate any obvious fracture of the tuft or distal phalanx.    PMFS History: Patient Active Problem List   Diagnosis Date Noted   Injury of nail bed of finger of left hand 12/17/2020   Gangrene of left foot (  [place]) 01/12/2019   Subacute osteomyelitis, left ankle and foot ([facility])    Acute osteomyelitis of left calcaneus (HCC)    Ulcer of left heel and midfoot with necrosis of bone ([place])    Gastroesophageal reflux disease without esophagitis 11/09/2017   Abdominal pain, chronic, epigastric 11/09/2017   Numbness    Syncope 03/13/2017   H/O cervical spine surgery 03/13/2017   Cervical spondylosis with radiculopathy 12/29/2016   CVA (cerebral vascular accident) ([facility] [facility]) 08/30/2014   Diabetes with skin complication ([facility]) 65/78/4696   Neuropathy 08/30/2014   AKI (acute kidney injury) ([facility]) 08/30/2014   Bilateral occipital neuralgia 10/30/2013   Lumbar stenosis without neurogenic claudication 01/12/2013   Type 2 diabetes mellitus with vascular disease ([facility])    Hypertension    Hyperlipidemia    Gastroparesis    Pulmonary embolus ([facility]) 2012    Obesity    GERD 01/14/2010   Chest pain 10/05/2003   Past  Medical History:  Diagnosis Date   Allergic rhinitis, cause unspecified    Anginal pain ([place])    Arthritis    BPH (benign prostatic hyperplasia)    Cervicalgia    Chest pain Sept. 2005   multiple caths  /  cath 06/15/2010..normal coronaries,  EF 60%   (false postive nuclear in the past)   CKD (chronic kidney disease), stage III (HCC)    Complication of anesthesia    Hard time waking up patient stated low blood pressures around 2017   Dyslipidemia    mixed   Esophageal reflux    Fibromyalgia    Gastroparesis    Headache(784.0)    History of kidney stones    Hyperlipidemia    Hypertension    Hypoplasia of one kidney    IDDM (insulin dependent diabetes mellitus)    Lumbago    Neuropathy associated with endocrine disorder (HCC)    Overweight(278.02)    Peripheral vascular disease (HCC)    Pulmonary embolus (HCC)    Hx of small left lower lobe  pulmonary embolus   Pulmonary embolus (HCC) 2010ish   Renal insufficiency    Stroke ([place])    mini stroke aug 2016   Type II or unspecified type diabetes mellitus with neurological manifestations, not stated as uncontrolled(250.60)    Urticaria, unspecified    Wears glasses     Family History  Problem Relation Age of Onset   Heart attack Father 32   Hyperlipidemia Father    Hypertension Father    Heart attack Mother 55   Diabetes Mother    Hypertension Mother    Hyperlipidemia Mother    Healthy Brother    Seizures Brother    Migraines Neg Hx     Past Surgical History:  Procedure Laterality Date   ACHILLES TENDON REPAIR Left 2013   AMPUTATION Left 06/02/2013   Procedure: AMPUTATION 1ST TOE LEFT FOOT;  Surgeon: Marcheta Grammes, DPM;  Location: [facility];  Service: Orthopedics;  Laterality: Left;   AMPUTATION Left 07/21/2013   Procedure: PARTIAL AMPUTATION 2ND TOE LEFT FOOT;  Surgeon: Marcheta Grammes, DPM;  Location: [facility];  Service: Podiatry;  Laterality: Left;   AMPUTATION Left 01/12/2019   Procedure: LEFT BELOW KNEE  AMPUTATION;  Surgeon: Newt Minion, MD;  Location: [facility];  Service: Orthopedics;  Laterality: Left;   ANTERIOR CERVICAL DECOMP/DISCECTOMY FUSION N/A 12/29/2016   Procedure: Cervical five-six Cervical six-seven Anterior cervical discectomy with fusion and plate fixation;  Surgeon: Ditty, Kevan Ny, MD;  Location: [facility];  Service: Neurosurgery;  Laterality: N/A;   APPENDECTOMY     BACK SURGERY  1999   x 6 some fusions   BELOW KNEE LEG AMPUTATION Left 01/2019   BIOPSY  12/18/2017   Procedure: BIOPSY;  Surgeon: Rogene Houston, MD;  [facility];  Service: Endoscopy;;  gastric and esophagus   colonscopy     ESOPHAGOGASTRODUODENOSCOPY (EGD) WITH PROPOFOL N/A 12/18/2017   Procedure: ESOPHAGOGASTRODUODENOSCOPY (EGD) WITH PROPOFOL;  Surgeon: Rogene Houston, MD;  [facility];  Service: Endoscopy;  Laterality: N/A;  9:25   FOOT ARTHRODESIS Right 01/11/2014   Procedure: ARTHRODESIS INTERPHALANGEAL JOINT HALLUX RIGHT FOOT;  Surgeon: Marcheta Grammes, DPM;  Location: [facility];  Service: Podiatry;  Laterality: Right;   HARDWARE REMOVAL Right 01/17/2015   Procedure: HARDWARE REMOVAL;  Surgeon: Caprice Beaver, DPM;  Location: [facility];  Service: Podiatry;  Laterality: Right;   I & D EXTREMITY Left 12/22/2018   Procedure: LEFT PARTIAL CALCANEAL EXCISION;  Surgeon: Newt Minion, MD;  Location: [facility];  Service: Orthopedics;  Laterality: Left;   KNEE ARTHROSCOPY Right 10/2015   LUMBAR LAMINECTOMY/DECOMPRESSION MICRODISCECTOMY Right 01/12/2013   Procedure: Right Lumbar Three-Four Laminotomy/Foraminotomy;  Surgeon: Floyce Stakes, MD;  [facility];  Service: Neurosurgery;  Laterality: Right;  Right Lumbar Three-Four Laminotomy/Foraminotomy   NISSEN FUNDOPLICATION     SHOULDER ARTHROSCOPY W/ ROTATOR CUFF REPAIR Right    SHOULDER ARTHROSCOPY WITH DISTAL CLAVICLE RESECTION Right 10/04/2018   Procedure: RIGHT SHOULDER ARTHROSCOPY WITH DISTAL CLAVICLE RESECTION,  EXTENSIVE DEBRIDEMENT, SUBACROMIAL PARTIAL ACROMIOPLASTY,;  Surgeon: Earlie Server, MD;  Location: [facility];  Service: Orthopedics;  Laterality: Right;  PRE/POST OP SCALENE   Social History   Occupational History   Occupation: Retired- Research officer, trade union  Tobacco Use   Smoking status: Former    Packs/day: 3.00    Years: 5.00    Pack years: 15.00    Types: Cigarettes    Start date: 06/20/1977    Quit date: 07/20/1982    Years since quitting: 38.4   Smokeless tobacco: Never   Tobacco comments:    quit 1980's  Vaping Use   Vaping Use: Never used  Substance and Sexual Activity   Alcohol use: No    Alcohol/week: 0.0 standard drinks   Drug use: No   Sexual activity: Yes    Birth control/protection: None

## 2020-12-18 LAB — BASIC METABOLIC PANEL
BUN/Creatinine Ratio: 12 (calc) (ref 6–22)
BUN: 28 mg/dL — ABNORMAL HIGH (ref 7–25)
CO2: 19 mmol/L — ABNORMAL LOW (ref 20–32)
Calcium: 9.7 mg/dL (ref 8.6–10.3)
Chloride: 106 mmol/L (ref 98–110)
Creat: 2.27 mg/dL — ABNORMAL HIGH (ref 0.70–1.35)
Glucose, Bld: 289 mg/dL — ABNORMAL HIGH (ref 65–99)
Potassium: 4.5 mmol/L (ref 3.5–5.3)
Sodium: 138 mmol/L (ref 135–146)

## 2020-12-18 LAB — SEDIMENTATION RATE: Sed Rate: 6 mm/h (ref 0–20)

## 2020-12-18 LAB — CBC WITH DIFFERENTIAL/PLATELET
Absolute Monocytes: 526 cells/uL (ref 200–950)
Basophils Absolute: 140 cells/uL (ref 0–200)
Basophils Relative: 2.5 %
Eosinophils Absolute: 319 cells/uL (ref 15–500)
Eosinophils Relative: 5.7 %
HCT: 49.2 % (ref 38.5–50.0)
Hemoglobin: 16.3 g/dL (ref 13.2–17.1)
Lymphs Abs: 1534 cells/uL (ref 850–3900)
MCH: 28.5 pg (ref 27.0–33.0)
MCHC: 33.1 g/dL (ref 32.0–36.0)
MCV: 86.2 fL (ref 80.0–100.0)
MPV: 12.2 fL (ref 7.5–12.5)
Monocytes Relative: 9.4 %
Neutro Abs: 3080 cells/uL (ref 1500–7800)
Neutrophils Relative %: 55 %
Platelets: 158 10*3/uL (ref 140–400)
RBC: 5.71 10*6/uL (ref 4.20–5.80)
RDW: 13 % (ref 11.0–15.0)
Total Lymphocyte: 27.4 %
WBC: 5.6 10*3/uL (ref 3.8–10.8)

## 2020-12-18 LAB — C-REACTIVE PROTEIN: CRP: 6 mg/L (ref <8.0)

## 2020-12-31 DIAGNOSIS — N189 Chronic kidney disease, unspecified: Secondary | ICD-10-CM | POA: Diagnosis not present

## 2021-01-01 ENCOUNTER — Ambulatory Visit (INDEPENDENT_AMBULATORY_CARE_PROVIDER_SITE_OTHER): Payer: Medicare HMO | Admitting: Podiatry

## 2021-01-01 ENCOUNTER — Other Ambulatory Visit: Payer: Self-pay

## 2021-01-01 DIAGNOSIS — I739 Peripheral vascular disease, unspecified: Secondary | ICD-10-CM | POA: Diagnosis not present

## 2021-01-01 DIAGNOSIS — M2011 Hallux valgus (acquired), right foot: Secondary | ICD-10-CM | POA: Diagnosis not present

## 2021-01-01 DIAGNOSIS — L97511 Non-pressure chronic ulcer of other part of right foot limited to breakdown of skin: Secondary | ICD-10-CM

## 2021-01-02 NOTE — Progress Notes (Signed)
Subjective:  Patient ID: [name], male    DOB: Jun 22, 1960,  MRN: [id number]  Chief Complaint  Patient presents with   Diabetic Ulcer    2 week follow up ulcer check. Pt believes that he is healing well.     60 y.o. male presents with the above complaint. History confirmed with patient. Had his labs done due for review today.  Objective:  Physical Exam: warm, good capillary refill, no trophic changes or ulcerative lesions, normal DP and PT pulses, and normal sensory exam. DP pulse palpable, PT pulse non-palpable  Left: BKA Right Foot: right great toe wound plantar medial hallux without bulla. Upon debridement 0.3 diameter granular wound without warmth or periwound erythema. No probe to bone.  Toe slightly edematous, no redness today.  Recent Results (from the past 2160 hour(s))  CBC with Differential/Platelet     Status: None   Collection Time: 12/17/20  1:53 PM  Result Value Ref Range   WBC 5.6 3.8 - 10.8 Thousand/uL   RBC 5.71 4.20 - 5.80 Million/uL   Hemoglobin 16.3 13.2 - 17.1 g/dL   HCT 49.2 38.5 - 50.0 %   MCV 86.2 80.0 - 100.0 fL   MCH 28.5 27.0 - 33.0 pg   MCHC 33.1 32.0 - 36.0 g/dL   RDW 13.0 11.0 - 15.0 %   Platelets 158 140 - 400 Thousand/uL   MPV 12.2 7.5 - 12.5 fL   Neutro Abs 3,080 1,500 - 7,800 cells/uL   Lymphs Abs 1,534 850 - 3,900 cells/uL   Absolute Monocytes 526 200 - 950 cells/uL   Eosinophils Absolute 319 15 - 500 cells/uL   Basophils Absolute 140 0 - 200 cells/uL   Neutrophils Relative % 55 %   Total Lymphocyte 27.4 %   Monocytes Relative 9.4 %   Eosinophils Relative 5.7 %   Basophils Relative 2.5 %  Basic Metabolic Panel     Status: Abnormal   Collection Time: 12/17/20  1:53 PM  Result Value Ref Range   Glucose, Bld 289 (H) 65 - 99 mg/dL    Comment: .            Fasting reference interval . For someone without known diabetes, a glucose value >125 mg/dL indicates that they may have diabetes and this should be confirmed with  a follow-up test. .    BUN 28 (H) 7 - 25 mg/dL   Creat 2.27 (H) 0.70 - 1.35 mg/dL   BUN/Creatinine Ratio 12 6 - 22 (calc)   Sodium 138 135 - 146 mmol/L   Potassium 4.5 3.5 - 5.3 mmol/L   Chloride 106 98 - 110 mmol/L   CO2 19 (L) 20 - 32 mmol/L   Calcium 9.7 8.6 - 10.3 mg/dL  Sedimentation Rate     Status: None   Collection Time: 12/17/20  1:53 PM  Result Value Ref Range   Sed Rate 6 0 - 20 mm/h  C-reactive protein     Status: None   Collection Time: 12/17/20  1:53 PM  Result Value Ref Range   CRP 6.0 <8.0 mg/L   Assessment:   1. Ulcer of toe of right foot, limited to breakdown of skin (HCC)   2. Hallux valgus, right   3. PAD (peripheral artery disease) ([place])     Plan:  Patient was evaluated and treated and all questions answered.  Ulcer toe right foot, breakdown of skin -Labs reviewed, infection markers normal. -Will continue to monitor clinically. -Minimally debrided ulceration (non-procedural), dressed with silvadene  and band-aid -Continue the surgical shoe.  No follow-ups on file.

## 2021-01-10 DIAGNOSIS — N189 Chronic kidney disease, unspecified: Secondary | ICD-10-CM | POA: Diagnosis not present

## 2021-01-10 DIAGNOSIS — E211 Secondary hyperparathyroidism, not elsewhere classified: Secondary | ICD-10-CM | POA: Diagnosis not present

## 2021-01-10 DIAGNOSIS — E1122 Type 2 diabetes mellitus with diabetic chronic kidney disease: Secondary | ICD-10-CM | POA: Diagnosis not present

## 2021-01-10 DIAGNOSIS — I129 Hypertensive chronic kidney disease with stage 1 through stage 4 chronic kidney disease, or unspecified chronic kidney disease: Secondary | ICD-10-CM | POA: Diagnosis not present

## 2021-01-10 DIAGNOSIS — E6609 Other obesity due to excess calories: Secondary | ICD-10-CM | POA: Diagnosis not present

## 2021-01-17 DIAGNOSIS — Z6833 Body mass index (BMI) 33.0-33.9, adult: Secondary | ICD-10-CM | POA: Diagnosis not present

## 2021-01-17 DIAGNOSIS — E1165 Type 2 diabetes mellitus with hyperglycemia: Secondary | ICD-10-CM | POA: Diagnosis not present

## 2021-01-17 DIAGNOSIS — Z2821 Immunization not carried out because of patient refusal: Secondary | ICD-10-CM | POA: Diagnosis not present

## 2021-01-17 DIAGNOSIS — J069 Acute upper respiratory infection, unspecified: Secondary | ICD-10-CM | POA: Diagnosis not present

## 2021-01-17 DIAGNOSIS — Z299 Encounter for prophylactic measures, unspecified: Secondary | ICD-10-CM | POA: Diagnosis not present

## 2021-01-17 DIAGNOSIS — E669 Obesity, unspecified: Secondary | ICD-10-CM | POA: Diagnosis not present

## 2021-01-17 DIAGNOSIS — Z87891 Personal history of nicotine dependence: Secondary | ICD-10-CM | POA: Diagnosis not present

## 2021-01-18 ENCOUNTER — Ambulatory Visit (INDEPENDENT_AMBULATORY_CARE_PROVIDER_SITE_OTHER): Payer: Medicare HMO | Admitting: Podiatry

## 2021-01-18 DIAGNOSIS — Z91199 Patient's noncompliance with other medical treatment and regimen due to unspecified reason: Secondary | ICD-10-CM

## 2021-01-18 DIAGNOSIS — R972 Elevated prostate specific antigen [PSA]: Secondary | ICD-10-CM | POA: Diagnosis not present

## 2021-01-18 NOTE — Progress Notes (Signed)
No show for wound follow-up

## 2021-01-25 ENCOUNTER — Ambulatory Visit (INDEPENDENT_AMBULATORY_CARE_PROVIDER_SITE_OTHER): Payer: Medicare HMO | Admitting: Podiatry

## 2021-01-25 DIAGNOSIS — L97511 Non-pressure chronic ulcer of other part of right foot limited to breakdown of skin: Secondary | ICD-10-CM | POA: Diagnosis not present

## 2021-01-30 DIAGNOSIS — Z87891 Personal history of nicotine dependence: Secondary | ICD-10-CM | POA: Diagnosis not present

## 2021-01-30 DIAGNOSIS — Z299 Encounter for prophylactic measures, unspecified: Secondary | ICD-10-CM | POA: Diagnosis not present

## 2021-01-30 DIAGNOSIS — J069 Acute upper respiratory infection, unspecified: Secondary | ICD-10-CM | POA: Diagnosis not present

## 2021-01-31 DIAGNOSIS — Z713 Dietary counseling and surveillance: Secondary | ICD-10-CM | POA: Diagnosis not present

## 2021-01-31 DIAGNOSIS — J069 Acute upper respiratory infection, unspecified: Secondary | ICD-10-CM | POA: Diagnosis not present

## 2021-01-31 DIAGNOSIS — Z6833 Body mass index (BMI) 33.0-33.9, adult: Secondary | ICD-10-CM | POA: Diagnosis not present

## 2021-01-31 DIAGNOSIS — Z299 Encounter for prophylactic measures, unspecified: Secondary | ICD-10-CM | POA: Diagnosis not present

## 2021-02-01 DIAGNOSIS — J069 Acute upper respiratory infection, unspecified: Secondary | ICD-10-CM | POA: Diagnosis not present

## 2021-02-01 DIAGNOSIS — E1165 Type 2 diabetes mellitus with hyperglycemia: Secondary | ICD-10-CM | POA: Diagnosis not present

## 2021-02-01 DIAGNOSIS — Z299 Encounter for prophylactic measures, unspecified: Secondary | ICD-10-CM | POA: Diagnosis not present

## 2021-02-01 DIAGNOSIS — Z87891 Personal history of nicotine dependence: Secondary | ICD-10-CM | POA: Diagnosis not present

## 2021-02-01 DIAGNOSIS — Z6833 Body mass index (BMI) 33.0-33.9, adult: Secondary | ICD-10-CM | POA: Diagnosis not present

## 2021-02-01 NOTE — Progress Notes (Signed)
°  Subjective:  Patient ID: [name], male    DOB: Oct 05, 1960,  MRN: [id number]  No chief complaint on file.   60 y.o. male presents with the above complaint. History confirmed with patient. Denies new pedal issues.   Objective:  Physical Exam: warm, good capillary refill, no trophic changes or ulcerative lesions, normal DP and PT pulses, and normal sensory exam. DP pulse palpable, PT pulse non-palpable  Left: BKA Right Foot: right great toe wound 0.2x0.3 wound without warmth or periwound erythema. No probe to bone.  Toe slightly edematous, no redness today. Assessment:   1. Ulcer of toe of right foot, limited to breakdown of skin [facility])      Plan:  Patient was evaluated and treated and all questions answered.  Ulcer toe right foot, breakdown of skin  -Will continue to monitor clinically. -Again minimally debrided ulceration (non-procedural), dressed with silvadene and band-aid -Continue the surgical shoe.  No follow-ups on file.

## 2021-02-06 DIAGNOSIS — M19041 Primary osteoarthritis, right hand: Secondary | ICD-10-CM | POA: Diagnosis not present

## 2021-02-06 DIAGNOSIS — E1165 Type 2 diabetes mellitus with hyperglycemia: Secondary | ICD-10-CM | POA: Diagnosis not present

## 2021-02-06 DIAGNOSIS — Z6833 Body mass index (BMI) 33.0-33.9, adult: Secondary | ICD-10-CM | POA: Diagnosis not present

## 2021-02-06 DIAGNOSIS — E1140 Type 2 diabetes mellitus with diabetic neuropathy, unspecified: Secondary | ICD-10-CM | POA: Diagnosis not present

## 2021-02-06 DIAGNOSIS — M19042 Primary osteoarthritis, left hand: Secondary | ICD-10-CM | POA: Diagnosis not present

## 2021-02-06 DIAGNOSIS — Z789 Other specified health status: Secondary | ICD-10-CM | POA: Diagnosis not present

## 2021-02-06 DIAGNOSIS — Z299 Encounter for prophylactic measures, unspecified: Secondary | ICD-10-CM | POA: Diagnosis not present

## 2021-02-28 DIAGNOSIS — E785 Hyperlipidemia, unspecified: Secondary | ICD-10-CM | POA: Diagnosis not present

## 2021-02-28 DIAGNOSIS — E1142 Type 2 diabetes mellitus with diabetic polyneuropathy: Secondary | ICD-10-CM | POA: Diagnosis not present

## 2021-02-28 DIAGNOSIS — Z89512 Acquired absence of left leg below knee: Secondary | ICD-10-CM | POA: Diagnosis not present

## 2021-02-28 DIAGNOSIS — M1990 Unspecified osteoarthritis, unspecified site: Secondary | ICD-10-CM | POA: Diagnosis not present

## 2021-02-28 DIAGNOSIS — E1169 Type 2 diabetes mellitus with other specified complication: Secondary | ICD-10-CM | POA: Diagnosis not present

## 2021-02-28 DIAGNOSIS — Z794 Long term (current) use of insulin: Secondary | ICD-10-CM | POA: Diagnosis not present

## 2021-02-28 DIAGNOSIS — E669 Obesity, unspecified: Secondary | ICD-10-CM | POA: Diagnosis not present

## 2021-02-28 DIAGNOSIS — E1165 Type 2 diabetes mellitus with hyperglycemia: Secondary | ICD-10-CM | POA: Diagnosis not present

## 2021-02-28 DIAGNOSIS — D8481 Immunodeficiency due to conditions classified elsewhere: Secondary | ICD-10-CM | POA: Diagnosis not present

## 2021-02-28 DIAGNOSIS — E1151 Type 2 diabetes mellitus with diabetic peripheral angiopathy without gangrene: Secondary | ICD-10-CM | POA: Diagnosis not present

## 2021-02-28 DIAGNOSIS — I208 Other forms of angina pectoris: Secondary | ICD-10-CM | POA: Diagnosis not present

## 2021-02-28 DIAGNOSIS — E1122 Type 2 diabetes mellitus with diabetic chronic kidney disease: Secondary | ICD-10-CM | POA: Diagnosis not present

## 2021-03-06 ENCOUNTER — Telehealth: Payer: Self-pay | Admitting: Cardiology

## 2021-03-06 NOTE — Telephone Encounter (Signed)
Appointment given to see Branch 03/07/2021 @8 :20 am at the [place] office. Advised to be there at 8:00 am wit medications.

## 2021-03-06 NOTE — Telephone Encounter (Signed)
Pt came into the office stating he's been having a low heart rate. Dr. Theador Hawthorne checked it and a nurse that came out to his home for his ins and both times it was in the 53's.   States that he has occasional chest pain off and on like a fluttering feeling   No shortness of breath or dizziness   Please give pt a call at [phone]

## 2021-03-07 ENCOUNTER — Ambulatory Visit (INDEPENDENT_AMBULATORY_CARE_PROVIDER_SITE_OTHER): Payer: HMO

## 2021-03-07 ENCOUNTER — Other Ambulatory Visit: Payer: Self-pay

## 2021-03-07 ENCOUNTER — Ambulatory Visit (INDEPENDENT_AMBULATORY_CARE_PROVIDER_SITE_OTHER): Payer: HMO | Admitting: Cardiology

## 2021-03-07 ENCOUNTER — Other Ambulatory Visit: Payer: Self-pay | Admitting: Cardiology

## 2021-03-07 ENCOUNTER — Encounter: Payer: Self-pay | Admitting: Cardiology

## 2021-03-07 VITALS — BP 140/86 | HR 74 | Ht 77.0 in | Wt 262.0 lb

## 2021-03-07 DIAGNOSIS — E782 Mixed hyperlipidemia: Secondary | ICD-10-CM | POA: Diagnosis not present

## 2021-03-07 DIAGNOSIS — R001 Bradycardia, unspecified: Secondary | ICD-10-CM

## 2021-03-07 DIAGNOSIS — R079 Chest pain, unspecified: Secondary | ICD-10-CM | POA: Diagnosis not present

## 2021-03-07 DIAGNOSIS — Z8673 Personal history of transient ischemic attack (TIA), and cerebral infarction without residual deficits: Secondary | ICD-10-CM

## 2021-03-07 NOTE — Progress Notes (Signed)
Clinical Summary [name] is a 61 y.o.male seen today for follow up of the following medical problems.   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 2005 and 2012 caths without epicardial disease - 04/2020 nuclear stress: no ischemia   - mild chest pains at times, similar to prior symptoms     2. HTN - normal bp yesterday with neprhology - home bp 120s/80s, checks twice a week.      3. Hyperlipidemia - labs followed by pcp - he is on atrovastatin.    4. History of TIA - off ASA per renal according to patient - just messaged Dr Theador Hawthorne in clinic today, he is ok being of ASA.     5. DM2  6. Low heart rates - reports home nurse rate 53, at other provider 54 - denies any lightheadedness, dizziness, or syncope Past Medical History:  Diagnosis Date   Allergic rhinitis, cause unspecified    Anginal pain (HCC)    Arthritis    BPH (benign prostatic hyperplasia)    Cervicalgia    Chest pain Sept. 2005   multiple caths  /  cath 06/15/2010..normal coronaries,  EF 60%   (false postive nuclear in the past)   CKD (chronic kidney disease), stage III (HCC)    Complication of anesthesia    Hard time waking up patient stated low blood pressures around 2017   Dyslipidemia    mixed   Esophageal reflux    Fibromyalgia    Gastroparesis    Headache(784.0)    History of kidney stones    Hyperlipidemia    Hypertension    Hypoplasia of one kidney    IDDM (insulin dependent diabetes mellitus)    Lumbago    Neuropathy associated with endocrine disorder ([place])    Overweight(278.02)    Peripheral vascular disease ([place])    Pulmonary embolus (HCC)    Hx of small left lower lobe  pulmonary embolus   Pulmonary embolus ([place]) 2010ish   Renal insufficiency    Stroke [facility])    mini stroke aug 2016   Type II or unspecified type diabetes mellitus with neurological manifestations, not stated as uncontrolled(250.60)    Urticaria, unspecified    Wears glasses      Allergies   Allergen Reactions   Bee Venom Swelling   Reglan [Metoclopramide] Itching and Rash     Current Outpatient Medications  Medication Sig Dispense Refill   acetaminophen (TYLENOL) 325 MG tablet Take 2 tablets (650 mg total) by mouth every 6 (six) hours as needed for up to 30 doses for mild pain or moderate pain. 30 tablet 0   aspirin EC 81 MG tablet Take 1 tablet (81 mg total) by mouth daily. Swallow whole.     atorvastatin (LIPITOR) 40 MG tablet TAKE 1 TABLET BY MOUTH EVERY DAY 30 tablet 11   ciprofloxacin (CIPRO) 500 MG tablet Take 500 mg by mouth 2 (two) times daily.     dapagliflozin propanediol (FARXIGA) 10 MG TABS tablet Take by mouth.     doxycycline (VIBRA-TABS) 100 MG tablet Take 1 tablet (100 mg total) by mouth 2 (two) times daily. 14 tablet 0   EPINEPHrine 0.3 mg/0.3 mL IJ SOAJ injection Inject 0.3 mg into the muscle as needed for anaphylaxis.     EPINEPHrine 0.3 mg/0.3 mL IJ SOAJ injection Inject into the muscle.     gabapentin (NEURONTIN) 600 MG tablet 1 tab by mouth every morning & 3 tabs at bedtime     glipiZIDE (  GLUCOTROL) 10 MG tablet Take 10 mg by mouth daily.     insulin detemir (LEVEMIR) 100 UNIT/ML injection Inject into the skin.     isosorbide mononitrate (IMDUR) 30 MG 24 hr tablet Take 0.5 tablets (15 mg total) by mouth daily. 15 tablet 6   Lancets (ONETOUCH DELICA PLUS [id number]) MISC daily.     levofloxacin (LEVAQUIN) 750 MG tablet Take 750 mg by mouth every morning.     Multiple Vitamin (MULTIVITAMIN) capsule Take by mouth.     Omega 3 1000 MG CAPS Take by mouth.     omega-3 acid ethyl esters (LOVAZA) 1 g capsule Take 1 g by mouth daily.     ONETOUCH ULTRA test strip USE TO TEST BLOOD SUGAR THREE TIMES DAILY     oxyCODONE (ROXICODONE) 5 MG immediate release tablet Take 1 tablet (5 mg total) by mouth every 6 (six) hours as needed for up to 20 doses for severe pain. 20 tablet 0   ranolazine (RANEXA) 1000 MG SR tablet Take 1 tablet (1,000 mg total) by mouth 2 (two)  times daily. 60 tablet 6   silver sulfADIAZINE (SILVADENE) 1 % cream Apply pea-sized amount to wound daily. 50 g 0   sodium bicarbonate 650 MG tablet Take 650 mg by mouth 2 (two) times daily.      sodium bicarbonate 650 MG tablet Take 1 tablet by mouth 3 (three) times daily.     No current facility-administered medications for this visit.     Past Surgical History:  Procedure Laterality Date   ACHILLES TENDON REPAIR Left 2013   AMPUTATION Left 06/02/2013   Procedure: AMPUTATION 1ST TOE LEFT FOOT;  Surgeon: Marcheta Grammes, DPM;  [facility];  Service: Orthopedics;  Laterality: Left;   AMPUTATION Left 07/21/2013   Procedure: PARTIAL AMPUTATION 2ND TOE LEFT FOOT;  Surgeon: Marcheta Grammes, DPM;  [facility];  Service: Podiatry;  Laterality: Left;   AMPUTATION Left 01/12/2019   Procedure: LEFT BELOW KNEE AMPUTATION;  Surgeon: Newt Minion, MD;  Location: [place];  Service: Orthopedics;  Laterality: Left;   ANTERIOR CERVICAL DECOMP/DISCECTOMY FUSION N/A 12/29/2016   Procedure: Cervical five-six Cervical six-seven Anterior cervical discectomy with fusion and plate fixation;  Surgeon: Ditty, Kevan Ny, MD;  Location: [place];  Service: Neurosurgery;  Laterality: N/A;   APPENDECTOMY     BACK SURGERY  1999   x 6 some fusions   BELOW KNEE LEG AMPUTATION Left 01/2019   BIOPSY  12/18/2017   Procedure: BIOPSY;  Surgeon: Rogene Houston, MD;  [facility];  Service: Endoscopy;;  gastric and esophagus   colonscopy     ESOPHAGOGASTRODUODENOSCOPY (EGD) WITH PROPOFOL N/A 12/18/2017   Procedure: ESOPHAGOGASTRODUODENOSCOPY (EGD) WITH PROPOFOL;  Surgeon: Rogene Houston, MD;  [facility];  Service: Endoscopy;  Laterality: N/A;  9:25   FOOT ARTHRODESIS Right 01/11/2014   Procedure: ARTHRODESIS INTERPHALANGEAL JOINT HALLUX RIGHT FOOT;  Surgeon: Marcheta Grammes, DPM;  [facility];  Service: Podiatry;  Laterality: Right;   HARDWARE REMOVAL Right  01/17/2015   Procedure: HARDWARE REMOVAL;  Surgeon: Caprice Beaver, DPM;  [facility];  Service: Podiatry;  Laterality: Right;   I & D EXTREMITY Left 12/22/2018   Procedure: LEFT PARTIAL CALCANEAL EXCISION;  Surgeon: Newt Minion, MD;  Location: [place];  Service: Orthopedics;  Laterality: Left;   KNEE ARTHROSCOPY Right 10/2015   LUMBAR LAMINECTOMY/DECOMPRESSION MICRODISCECTOMY Right 01/12/2013   Procedure: Right Lumbar Three-Four Laminotomy/Foraminotomy;  Surgeon: Stann Mainland  Andres Shad, MD;  Location: [place] [facility];  Service: Neurosurgery;  Laterality: Right;  Right Lumbar Three-Four Laminotomy/Foraminotomy   NISSEN FUNDOPLICATION     SHOULDER ARTHROSCOPY W/ ROTATOR CUFF REPAIR Right    SHOULDER ARTHROSCOPY WITH DISTAL CLAVICLE RESECTION Right 10/04/2018   Procedure: RIGHT SHOULDER ARTHROSCOPY WITH DISTAL CLAVICLE RESECTION, EXTENSIVE DEBRIDEMENT, SUBACROMIAL PARTIAL ACROMIOPLASTY,;  Surgeon: Earlie Server, MD;  Location: [place];  Service: Orthopedics;  Laterality: Right;  PRE/POST OP SCALENE     Allergies  Allergen Reactions   Bee Venom Swelling   Reglan [Metoclopramide] Itching and Rash      Family History  Problem Relation Age of Onset   Heart attack Father 36   Hyperlipidemia Father    Hypertension Father    Heart attack Mother 17   Diabetes Mother    Hypertension Mother    Hyperlipidemia Mother    Healthy Brother    Seizures Brother    Migraines Neg Hx      Social History Mr. Odaniel reports that he quit smoking about 38 years ago. His smoking use included cigarettes. He started smoking about 43 years ago. He has a 15.00 pack-year smoking history. He has never used smokeless tobacco. Mr. Okuda reports no history of alcohol use.   Review of Systems CONSTITUTIONAL: No weight loss, fever, chills, weakness or fatigue.  HEENT: Eyes: No visual loss, blurred vision, double vision or yellow sclerae.No hearing loss, sneezing, congestion, runny nose  or sore throat.  SKIN: No rash or itching.  CARDIOVASCULAR: per hpi RESPIRATORY: No shortness of breath, cough or sputum.  GASTROINTESTINAL: No anorexia, nausea, vomiting or diarrhea. No abdominal pain or blood.  GENITOURINARY: No burning on urination, no polyuria NEUROLOGICAL: No headache, dizziness, syncope, paralysis, ataxia, numbness or tingling in the extremities. No change in bowel or bladder control.  MUSCULOSKELETAL: No muscle, back pain, joint pain or stiffness.  LYMPHATICS: No enlarged nodes. No history of splenectomy.  PSYCHIATRIC: No history of depression or anxiety.  ENDOCRINOLOGIC: No reports of sweating, cold or heat intolerance. No polyuria or polydipsia.  [name]   Physical Examination Today's Vitals   03/07/21 0805  BP: 140/86  Pulse: 74  SpO2: 99%  Weight: 262 lb (118.8 kg)  Height: 6\' 5"  (1.956 m)   Body mass index is 31.07 kg/m.  Gen: resting comfortably, no acute distress HEENT: no scleral icterus, pupils equal round and reactive, no palptable cervical adenopathy,  CV: RRR, no m/rg, no jvd Resp: Clear to auscultation bilaterally GI: abdomen is soft, non-tender, non-distended, normal bowel sounds, no hepatosplenomegaly MSK: extremities are warm, no edema.  Skin: warm, no rash Neuro:  no focal deficits Psych: appropriate affect   Diagnostic Studies  Echocardiogram: 03/2017 Study Conclusions   - Left ventricle: Inferobasal hypokinesis The cavity size was   normal. There was mild focal basal hypertrophy of the septum.   Systolic function was normal. The estimated ejection fraction was   in the range of 55% to 60%. Wall motion was normal; there were no   regional wall motion abnormalities. Doppler parameters are   consistent with abnormal left ventricular relaxation (grade 1   diastolic dysfunction). - Mitral valve: Valve area by pressure half-time: 1.55 cm^2. - Left atrium: The atrium was mildly dilated.   Cardiac Catheterization: 06/2010 ANGIOGRAPHIC  DATA:  The right coronary artery arises somewhat anteriorly.  It is a small nondominant vessel and is normal.   The left main coronary artery is normal.   The left anterior descending artery is  a large vessel and is normal.   There is a moderate ramus intermediate Anayely Constantine which is normal.   Left circumflex coronary artery is a dominant vessel.  There is minor narrowing of the third obtuse marginal vessel up to 40%.  Otherwise, the left circumflex system appears normal.   Left ventricular angiography performed in RAO view demonstrates normal left ventricular size and contractility with ejection fraction estimated at 60%.   FINAL INTERPRETATION: 1. No significant obstructive atherosclerotic coronary artery disease. 2. Normal left ventricular function.   04/2020 nuclear stress There was no ST segment deviation noted during stress. The study is normal. Inferior defect most consistent with subdiaphragmatic attenuation. Cannot completely exclude prior mild inferior infarct. Either finding would support low risk. This is a low risk study. The left ventricular ejection fraction is normal (55-65%).   Assessment and Plan  1.Chest pain -long history of symptoms with benign caths and more recent nuclear stress test - has been treated for possible microvascular disease with ranexa. He is no longer on imdur.  - chronic stable mild symptoms, continue to monitor at this time.    2. History of TIA - confirmed with Dr Theador Hawthorne today that ok for patient to be on aspirin, restart 81mg  daily  3. Hyperlipidemia - request labs from pcp, continue atorvatatin  4. Bradycardia - reports HRs in 50s at times, no specifici symptoms - obtain 48 hour holter to better evaluate hearts, eval for any bradyarrhythmias - EKG today NSR 75     F/u 6 months   Arnoldo Lenis, M.D.

## 2021-03-07 NOTE — Patient Instructions (Addendum)
Medication Instructions:  RESTART Aspirin 81 mg tablets daily  Follow-Up: Follow up with Dr. Harl Bowie in 6 months.   Any Other Special Instructions Will Be Listed Below (If Applicable).     If you need a refill on your cardiac medications before your next appointment, please call your pharmacy.  ZIO XT- Long Term Monitor Instructions   Your physician has requested you wear your ZIO patch monitor for 48 HOURS.   This is a single patch monitor.  [employer] supplies one patch monitor per enrollment.  Additional stickers are not available.   Please do not apply patch if you will be having a Nuclear Stress Test, Echocardiogram, Cardiac CT, MRI, or Chest Xray during the time frame you would be wearing the monitor. The patch cannot be worn during these tests.  You cannot remove and re-apply the ZIO XT patch monitor.   Your ZIO patch monitor will be sent USPS Priority mail from [facility] directly to your home address. The monitor may also be mailed to a PO BOX if home delivery is not available.   It may take 3-5 days to receive your monitor after you have been enrolled.   Once you have received you monitor, please review enclosed instructions.  Your monitor has already been registered assigning a specific monitor serial # to you.   Applying the monitor   Shave hair from upper left chest.   Hold abrader disc by orange tab.  Rub abrader in 40 strokes over left upper chest as indicated in your monitor instructions.   Clean area with 4 enclosed alcohol pads .  Use all pads to assure are is cleaned thoroughly.  Let dry.   Apply patch as indicated in monitor instructions.  Patch will be place under collarbone on left side of chest with arrow pointing upward.   Rub patch adhesive wings for 2 minutes.Remove white label marked "1".  Remove white label marked "2".  Rub patch adhesive wings for 2 additional minutes.   While looking in a mirror, press and release button in center of patch.  A  small green light will flash 3-4 times .  This will be your only indicator the monitor has been turned on.     Do not shower for the first 24 hours.  You may shower after the first 24 hours.   Press button if you feel a symptom. You will hear a small click.  Record Date, Time and Symptom in the Patient Log Book.   When you are ready to remove patch, follow instructions on last 2 pages of Patient Log Book.  Stick patch monitor onto last page of Patient Log Book.   Place Patient Log Book in [place] box.  Use locking tab on box and tape box closed securely.  The [employer] has [employer] on it.  Please place in mailbox as soon as possible.  Your physician should have your test results approximately 7 days after the monitor has been mailed back to [facility].   Call [place] at [phone] if you have questions regarding your ZIO XT patch monitor.  Call them immediately if you see an orange light blinking on your monitor.   If your monitor falls off in less than 4 days contact our Monitor department at [phone].  If your monitor becomes loose or falls off after 4 days call [employer] at [phone] for suggestions on securing your monitor.

## 2021-03-09 DIAGNOSIS — R001 Bradycardia, unspecified: Secondary | ICD-10-CM | POA: Diagnosis not present

## 2021-03-12 DIAGNOSIS — R001 Bradycardia, unspecified: Secondary | ICD-10-CM | POA: Diagnosis not present

## 2021-03-13 DIAGNOSIS — N189 Chronic kidney disease, unspecified: Secondary | ICD-10-CM | POA: Diagnosis not present

## 2021-03-18 ENCOUNTER — Other Ambulatory Visit: Payer: Self-pay

## 2021-03-18 ENCOUNTER — Ambulatory Visit (INDEPENDENT_AMBULATORY_CARE_PROVIDER_SITE_OTHER): Payer: HMO | Admitting: Orthopedic Surgery

## 2021-03-18 DIAGNOSIS — S88112A Complete traumatic amputation at level between knee and ankle, left lower leg, initial encounter: Secondary | ICD-10-CM

## 2021-03-18 DIAGNOSIS — Z89512 Acquired absence of left leg below knee: Secondary | ICD-10-CM | POA: Diagnosis not present

## 2021-03-20 ENCOUNTER — Other Ambulatory Visit: Payer: Self-pay | Admitting: Nephrology

## 2021-03-20 ENCOUNTER — Encounter: Payer: Self-pay | Admitting: Orthopedic Surgery

## 2021-03-20 ENCOUNTER — Other Ambulatory Visit (HOSPITAL_COMMUNITY): Payer: Self-pay | Admitting: Nephrology

## 2021-03-20 DIAGNOSIS — N170 Acute kidney failure with tubular necrosis: Secondary | ICD-10-CM

## 2021-03-20 DIAGNOSIS — I129 Hypertensive chronic kidney disease with stage 1 through stage 4 chronic kidney disease, or unspecified chronic kidney disease: Secondary | ICD-10-CM | POA: Diagnosis not present

## 2021-03-20 DIAGNOSIS — N2581 Secondary hyperparathyroidism of renal origin: Secondary | ICD-10-CM

## 2021-03-20 DIAGNOSIS — E1122 Type 2 diabetes mellitus with diabetic chronic kidney disease: Secondary | ICD-10-CM | POA: Diagnosis not present

## 2021-03-20 DIAGNOSIS — E211 Secondary hyperparathyroidism, not elsewhere classified: Secondary | ICD-10-CM | POA: Diagnosis not present

## 2021-03-20 DIAGNOSIS — N189 Chronic kidney disease, unspecified: Secondary | ICD-10-CM | POA: Diagnosis not present

## 2021-03-20 NOTE — Progress Notes (Signed)
Office Visit Note   Patient: [name]           Date of Birth: February 06, 1960           MRN: [id number] Visit Date: 03/18/2021              Requested by: Ralph Leyden, [place],  [place] [id number] PCP: Ralph Leyden, FNP  Chief Complaint  Patient presents with   Left Leg - Follow-up    HX left BKA 01/12/2019      HPI: Patient is a 61 year old gentleman who is status post a left transtibial amputation 2 years ago.  He has been wearing his prosthesis and has been having an baring pain and ulceration.  Patient states he noticed bleeding Saturday with the open wound.  Assessment & Plan: Visit Diagnoses:  1. Below-knee amputation of left lower extremity ([place])     Plan: Patient is given a new prescription for socket liner materials and supplies.  Patient states he needs revision of his cervical spine surgery and recommended follow-up with Dr. Lorin Mercy.  Follow-Up Instructions: Return if symptoms worsen or fail to improve.   Ortho Exam  Patient is alert, oriented, no adenopathy, well-dressed, normal affect, normal respiratory effort. Examination patient has an baring callus and ulceration over the residual limb on the left secondary to subsiding in his socket.  There is no cellulitis no exposed bone no purulent drainage.  Patient is an existing left transtibial  amputee.  Patient's current comorbidities are not expected to impact the ability to function with the prescribed prosthesis. Patient verbally communicates a strong desire to use a prosthesis. Patient currently requires mobility aids to ambulate without a prosthesis.  Expects not to use mobility aids with a new prosthesis.  Patient is a K3 level ambulator that spends a lot of time walking around on uneven terrain over obstacles, up and down stairs, and ambulates with a variable cadence.     Imaging: No results found. No images are attached to the encounter.  Labs: Lab Results  Component Value  Date   HGBA1C 8.9 (H) 01/12/2019   HGBA1C 8.4 (H) 03/15/2017   HGBA1C 8.9 (H) 12/24/2016   ESRSEDRATE 6 12/17/2020   CRP 6.0 12/17/2020   REPTSTATUS 06/07/2013 FINAL 06/02/2013   REPTSTATUS 06/04/2013 FINAL 06/02/2013   GRAMSTAIN  06/02/2013    RARE WBC PRESENT,BOTH PMN AND MONONUCLEAR NO SQUAMOUS EPITHELIAL CELLS SEEN NO ORGANISMS SEEN Performed at [place]  06/02/2013    RARE WBC PRESENT,BOTH PMN AND MONONUCLEAR NO SQUAMOUS EPITHELIAL CELLS SEEN NO ORGANISMS SEEN Performed at [place]  06/02/2013    NO ANAEROBES ISOLATED Performed at [place]  06/02/2013    NO GROWTH 2 DAYS Performed at [facility]     Lab Results  Component Value Date   ALBUMIN 3.6 03/27/2020   ALBUMIN 4.0 04/06/2018   ALBUMIN 4.3 10/30/2017    No results found for: MG No results found for: VD25OH  No results found for: PREALBUMIN CBC EXTENDED Latest Ref Rng & Units 12/17/2020 05/12/2020 03/27/2020  WBC 3.8 - 10.8 Thousand/uL 5.6 9.0 6.2  RBC 4.20 - 5.80 Million/uL 5.71 5.27 4.98  HGB 13.2 - 17.1 g/dL 16.3 15.8 14.9  HCT 38.5 - 50.0 % 49.2 46.4 44.6  PLT 140 - 400 Thousand/uL 158 157 143(L)  NEUTROABS 1,500 - 7,800 cells/uL 3,080 4.7 -  LYMPHSABS 850 - 3,900 cells/uL  1,534 3.0 -     There is no height or weight on file to calculate BMI.  Orders:  No orders of the defined types were placed in this encounter.  No orders of the defined types were placed in this encounter.    Procedures: No procedures performed  Clinical Data: No additional findings.  ROS:  All other systems negative, except as noted in the HPI. Review of Systems  Objective: Vital Signs: There were no vitals taken for this visit.  Specialty Comments:  No specialty comments available.  PMFS History: Patient Active Problem List   Diagnosis Date Noted   Injury of nail bed of finger of left hand 12/17/2020   Gangrene of left foot ([place])  01/12/2019   Subacute osteomyelitis, left ankle and foot ([place])    Acute osteomyelitis of left calcaneus (HCC)    Ulcer of left heel and midfoot with necrosis of bone (HCC)    Gastroesophageal reflux disease without esophagitis 11/09/2017   Abdominal pain, chronic, epigastric 11/09/2017   Numbness    Syncope 03/13/2017   H/O cervical spine surgery 03/13/2017   Cervical spondylosis with radiculopathy 12/29/2016   CVA (cerebral vascular accident) ([place]) 08/30/2014   Diabetes with skin complication ([place]) 67/01/4579   Neuropathy 08/30/2014   AKI (acute kidney injury) ([place]) 08/30/2014   Bilateral occipital neuralgia 10/30/2013   Lumbar stenosis without neurogenic claudication 01/12/2013   Type 2 diabetes mellitus with vascular disease ([place])    Hypertension    Hyperlipidemia    Gastroparesis    Pulmonary embolus ([place]) 2012    Obesity    GERD 01/14/2010   Chest pain 10/05/2003   Past Medical History:  Diagnosis Date   Allergic rhinitis, cause unspecified    Anginal pain ([place])    Arthritis    BPH (benign prostatic hyperplasia)    Cervicalgia    Chest pain Sept. 2005   multiple caths  /  cath 06/15/2010..normal coronaries,  EF 60%   (false postive nuclear in the past)   CKD (chronic kidney disease), stage III (HCC)    Complication of anesthesia    Hard time waking up patient stated low blood pressures around 2017   Dyslipidemia    mixed   Esophageal reflux    Fibromyalgia    Gastroparesis    Headache(784.0)    History of kidney stones    Hyperlipidemia    Hypertension    Hypoplasia of one kidney    IDDM (insulin dependent diabetes mellitus)    Lumbago    Neuropathy associated with endocrine disorder (HCC)    Overweight(278.02)    Peripheral vascular disease (HCC)    Pulmonary embolus (HCC)    Hx of small left lower lobe  pulmonary embolus   Pulmonary embolus (HCC) 2010ish   Renal insufficiency    Stroke ([place])    mini stroke aug 2016   Type II or unspecified type diabetes  mellitus with neurological manifestations, not stated as uncontrolled(250.60)    Urticaria, unspecified    Wears glasses     Family History  Problem Relation Age of Onset   Heart attack Father 48   Hyperlipidemia Father    Hypertension Father    Heart attack Mother 70   Diabetes Mother    Hypertension Mother    Hyperlipidemia Mother    Healthy Brother    Seizures Brother    Migraines Neg Hx     Past Surgical History:  Procedure Laterality Date   ACHILLES TENDON REPAIR Left 2013  AMPUTATION Left 06/02/2013   Procedure: AMPUTATION 1ST TOE LEFT FOOT;  Surgeon: Marcheta Grammes, DPM;  [facility];  Service: Orthopedics;  Laterality: Left;   AMPUTATION Left 07/21/2013   Procedure: PARTIAL AMPUTATION 2ND TOE LEFT FOOT;  Surgeon: Marcheta Grammes, DPM;  [facility];  Service: Podiatry;  Laterality: Left;   AMPUTATION Left 01/12/2019   Procedure: LEFT BELOW KNEE AMPUTATION;  Surgeon: Newt Minion, MD;  Location: [place];  Service: Orthopedics;  Laterality: Left;   ANTERIOR CERVICAL DECOMP/DISCECTOMY FUSION N/A 12/29/2016   Procedure: Cervical five-six Cervical six-seven Anterior cervical discectomy with fusion and plate fixation;  Surgeon: Ditty, Kevan Ny, MD;  Location: [place];  Service: Neurosurgery;  Laterality: N/A;   APPENDECTOMY     BACK SURGERY  1999   x 6 some fusions   BELOW KNEE LEG AMPUTATION Left 01/2019   BIOPSY  12/18/2017   Procedure: BIOPSY;  Surgeon: Rogene Houston, MD;  [facility];  Service: Endoscopy;;  gastric and esophagus   colonscopy     ESOPHAGOGASTRODUODENOSCOPY (EGD) WITH PROPOFOL N/A 12/18/2017   Procedure: ESOPHAGOGASTRODUODENOSCOPY (EGD) WITH PROPOFOL;  Surgeon: Rogene Houston, MD;  [facility];  Service: Endoscopy;  Laterality: N/A;  9:25   FOOT ARTHRODESIS Right 01/11/2014   Procedure: ARTHRODESIS INTERPHALANGEAL JOINT HALLUX RIGHT FOOT;  Surgeon: Marcheta Grammes, DPM;  [facility];  Service:  Podiatry;  Laterality: Right;   HARDWARE REMOVAL Right 01/17/2015   Procedure: HARDWARE REMOVAL;  Surgeon: Caprice Beaver, DPM;  [facility];  Service: Podiatry;  Laterality: Right;   I & D EXTREMITY Left 12/22/2018   Procedure: LEFT PARTIAL CALCANEAL EXCISION;  Surgeon: Newt Minion, MD;  Location: [place];  Service: Orthopedics;  Laterality: Left;   KNEE ARTHROSCOPY Right 10/2015   LUMBAR LAMINECTOMY/DECOMPRESSION MICRODISCECTOMY Right 01/12/2013   Procedure: Right Lumbar Three-Four Laminotomy/Foraminotomy;  Surgeon: Floyce Stakes, MD;  [facility];  Service: Neurosurgery;  Laterality: Right;  Right Lumbar Three-Four Laminotomy/Foraminotomy   NISSEN FUNDOPLICATION     SHOULDER ARTHROSCOPY W/ ROTATOR CUFF REPAIR Right    SHOULDER ARTHROSCOPY WITH DISTAL CLAVICLE RESECTION Right 10/04/2018   Procedure: RIGHT SHOULDER ARTHROSCOPY WITH DISTAL CLAVICLE RESECTION, EXTENSIVE DEBRIDEMENT, SUBACROMIAL PARTIAL ACROMIOPLASTY,;  Surgeon: Earlie Server, MD;  Location: [place];  Service: Orthopedics;  Laterality: Right;  PRE/POST OP SCALENE   Social History   Occupational History   Occupation: Retired- Research officer, trade union  Tobacco Use   Smoking status: Former    Packs/day: 3.00    Years: 5.00    Pack years: 15.00    Types: Cigarettes    Start date: 06/20/1977    Quit date: 07/20/1982    Years since quitting: 38.6   Smokeless tobacco: Never   Tobacco comments:    quit 1980's  Vaping Use   Vaping Use: Never used  Substance and Sexual Activity   Alcohol use: No    Alcohol/week: 0.0 standard drinks   Drug use: No   Sexual activity: Yes    Birth control/protection: None

## 2021-03-26 ENCOUNTER — Other Ambulatory Visit: Payer: Self-pay

## 2021-03-26 ENCOUNTER — Telehealth: Payer: Self-pay | Admitting: Cardiology

## 2021-03-26 ENCOUNTER — Ambulatory Visit (HOSPITAL_COMMUNITY)
Admission: RE | Admit: 2021-03-26 | Discharge: 2021-03-26 | Disposition: A | Discharge status: Home / Self Care | Payer: HMO | Source: Ambulatory Visit | Attending: Nephrology | Admitting: Nephrology

## 2021-03-26 DIAGNOSIS — E1122 Type 2 diabetes mellitus with diabetic chronic kidney disease: Secondary | ICD-10-CM | POA: Insufficient documentation

## 2021-03-26 DIAGNOSIS — I129 Hypertensive chronic kidney disease with stage 1 through stage 4 chronic kidney disease, or unspecified chronic kidney disease: Secondary | ICD-10-CM | POA: Diagnosis not present

## 2021-03-26 DIAGNOSIS — N170 Acute kidney failure with tubular necrosis: Secondary | ICD-10-CM | POA: Diagnosis not present

## 2021-03-26 DIAGNOSIS — N179 Acute kidney failure, unspecified: Secondary | ICD-10-CM | POA: Diagnosis not present

## 2021-03-26 DIAGNOSIS — N2581 Secondary hyperparathyroidism of renal origin: Secondary | ICD-10-CM | POA: Insufficient documentation

## 2021-03-26 NOTE — Telephone Encounter (Signed)
Just resulted to Irving Copas MD

## 2021-03-26 NOTE — Telephone Encounter (Signed)
Laurine Blazer, LPN  3/73/4287  6:81 PM EST Back to Top    Notified via detailed voice message at patient's request.  Copy to pcp.     Arnoldo Lenis, MD  03/26/2021  4:38 PM EST     Heart monitor shows some occasional extra heart beats, at times can have a few in row. This is nothing dangerous but sometimes can cause feeling of heart skipping. Occasional low heart rates but not to the level of concern, his average heart rate for the entire time was normal at 63. NO additional testing is indicated     Zandra Abts MD

## 2021-03-26 NOTE — Telephone Encounter (Signed)
Patient is requesting a call to discuss his monitor results. He states he is working until 12:00 PM and requested a voice message if he is not available when his call is returned.

## 2021-03-27 DIAGNOSIS — E1122 Type 2 diabetes mellitus with diabetic chronic kidney disease: Secondary | ICD-10-CM | POA: Diagnosis not present

## 2021-03-27 DIAGNOSIS — E211 Secondary hyperparathyroidism, not elsewhere classified: Secondary | ICD-10-CM | POA: Diagnosis not present

## 2021-03-27 DIAGNOSIS — I129 Hypertensive chronic kidney disease with stage 1 through stage 4 chronic kidney disease, or unspecified chronic kidney disease: Secondary | ICD-10-CM | POA: Diagnosis not present

## 2021-03-27 DIAGNOSIS — N170 Acute kidney failure with tubular necrosis: Secondary | ICD-10-CM | POA: Diagnosis not present

## 2021-03-27 DIAGNOSIS — N186 End stage renal disease: Secondary | ICD-10-CM | POA: Diagnosis not present

## 2021-03-27 DIAGNOSIS — N189 Chronic kidney disease, unspecified: Secondary | ICD-10-CM | POA: Diagnosis not present

## 2021-04-03 DIAGNOSIS — N170 Acute kidney failure with tubular necrosis: Secondary | ICD-10-CM | POA: Diagnosis not present

## 2021-04-03 DIAGNOSIS — E211 Secondary hyperparathyroidism, not elsewhere classified: Secondary | ICD-10-CM | POA: Diagnosis not present

## 2021-04-03 DIAGNOSIS — I5032 Chronic diastolic (congestive) heart failure: Secondary | ICD-10-CM | POA: Diagnosis not present

## 2021-04-03 DIAGNOSIS — I129 Hypertensive chronic kidney disease with stage 1 through stage 4 chronic kidney disease, or unspecified chronic kidney disease: Secondary | ICD-10-CM | POA: Diagnosis not present

## 2021-04-03 DIAGNOSIS — E1122 Type 2 diabetes mellitus with diabetic chronic kidney disease: Secondary | ICD-10-CM | POA: Diagnosis not present

## 2021-04-03 DIAGNOSIS — N189 Chronic kidney disease, unspecified: Secondary | ICD-10-CM | POA: Diagnosis not present

## 2021-04-19 ENCOUNTER — Ambulatory Visit (INDEPENDENT_AMBULATORY_CARE_PROVIDER_SITE_OTHER): Payer: HMO

## 2021-04-19 ENCOUNTER — Telehealth: Payer: Self-pay | Admitting: Podiatry

## 2021-04-19 ENCOUNTER — Other Ambulatory Visit: Payer: Self-pay

## 2021-04-19 ENCOUNTER — Ambulatory Visit (INDEPENDENT_AMBULATORY_CARE_PROVIDER_SITE_OTHER): Payer: HMO | Admitting: Podiatry

## 2021-04-19 DIAGNOSIS — Z89512 Acquired absence of left leg below knee: Secondary | ICD-10-CM | POA: Diagnosis not present

## 2021-04-19 DIAGNOSIS — L03031 Cellulitis of right toe: Secondary | ICD-10-CM | POA: Diagnosis not present

## 2021-04-19 DIAGNOSIS — L97511 Non-pressure chronic ulcer of other part of right foot limited to breakdown of skin: Secondary | ICD-10-CM | POA: Diagnosis not present

## 2021-04-19 DIAGNOSIS — L97512 Non-pressure chronic ulcer of other part of right foot with fat layer exposed: Secondary | ICD-10-CM | POA: Diagnosis not present

## 2021-04-19 DIAGNOSIS — L02611 Cutaneous abscess of right foot: Secondary | ICD-10-CM | POA: Diagnosis not present

## 2021-04-19 NOTE — Telephone Encounter (Signed)
error 

## 2021-04-20 LAB — CBC WITH DIFFERENTIAL/PLATELET
Absolute Monocytes: 561 cells/uL (ref 200–950)
Basophils Absolute: 101 cells/uL (ref 0–200)
Basophils Relative: 1.6 %
Eosinophils Absolute: 372 cells/uL (ref 15–500)
Eosinophils Relative: 5.9 %
HCT: 46.9 % (ref 38.5–50.0)
Hemoglobin: 15.7 g/dL (ref 13.2–17.1)
Lymphs Abs: 2331 cells/uL (ref 850–3900)
MCH: 28.5 pg (ref 27.0–33.0)
MCHC: 33.5 g/dL (ref 32.0–36.0)
MCV: 85.1 fL (ref 80.0–100.0)
MPV: 12.6 fL — ABNORMAL HIGH (ref 7.5–12.5)
Monocytes Relative: 8.9 %
Neutro Abs: 2936 cells/uL (ref 1500–7800)
Neutrophils Relative %: 46.6 %
Platelets: 144 10*3/uL (ref 140–400)
RBC: 5.51 10*6/uL (ref 4.20–5.80)
RDW: 13.8 % (ref 11.0–15.0)
Total Lymphocyte: 37 %
WBC: 6.3 10*3/uL (ref 3.8–10.8)

## 2021-04-20 LAB — COMPREHENSIVE METABOLIC PANEL
AG Ratio: 1.4 (calc) (ref 1.0–2.5)
ALT: 24 U/L (ref 9–46)
AST: 27 U/L (ref 10–35)
Albumin: 4.2 g/dL (ref 3.6–5.1)
Alkaline phosphatase (APISO): 100 U/L (ref 35–144)
BUN/Creatinine Ratio: 14 (calc) (ref 6–22)
BUN: 31 mg/dL — ABNORMAL HIGH (ref 7–25)
CO2: 23 mmol/L (ref 20–32)
Calcium: 9.4 mg/dL (ref 8.6–10.3)
Chloride: 105 mmol/L (ref 98–110)
Creat: 2.29 mg/dL — ABNORMAL HIGH (ref 0.70–1.35)
Globulin: 3 g/dL (calc) (ref 1.9–3.7)
Glucose, Bld: 186 mg/dL — ABNORMAL HIGH (ref 65–139)
Potassium: 4.6 mmol/L (ref 3.5–5.3)
Sodium: 139 mmol/L (ref 135–146)
Total Bilirubin: 0.7 mg/dL (ref 0.2–1.2)
Total Protein: 7.2 g/dL (ref 6.1–8.1)

## 2021-04-20 LAB — BASIC METABOLIC PANEL WITH GFR
BUN/Creatinine Ratio: 14 (calc) (ref 6–22)
BUN: 31 mg/dL — ABNORMAL HIGH (ref 7–25)
CO2: 23 mmol/L (ref 20–32)
Calcium: 9.4 mg/dL (ref 8.6–10.3)
Chloride: 105 mmol/L (ref 98–110)
Creat: 2.29 mg/dL — ABNORMAL HIGH (ref 0.70–1.35)
Glucose, Bld: 186 mg/dL — ABNORMAL HIGH (ref 65–139)
Potassium: 4.6 mmol/L (ref 3.5–5.3)
Sodium: 139 mmol/L (ref 135–146)
eGFR: 32 mL/min/{1.73_m2} — ABNORMAL LOW (ref >=60)

## 2021-04-20 LAB — SEDIMENTATION RATE: Sed Rate: 6 mm/h (ref 0–20)

## 2021-04-20 LAB — C-REACTIVE PROTEIN: CRP: 3.5 mg/L (ref <8.0)

## 2021-04-23 MED ORDER — DOXYCYCLINE HYCLATE 100 MG PO TABS: 100.0000 mg | ORAL_TABLET | Freq: Two times a day (BID) | ORAL | 0 refills | Status: DC

## 2021-04-24 NOTE — Progress Notes (Signed)
Subjective: ?61 year old male presents the office today for follow-up evaluation of right foot hallux ulceration.  He was last seen in the office on January 26, 2019 with Dr. March Rummage.  He presents here with his wife.  They state that the toe will get swollen at times and become red at times but then it comes down fluctuates.  Denies any drainage or pus or any red streaks.  No fevers or chills that he reports. ? ?He previously had a hallux IPJ fusion.  He is asked about other surgery as he is not able to bend the toe. ? ?History of left below-knee amputation ? ?Previous arterial studies October 15, 2020 on the right side revealed they were within normal limits.  No significant lower extremity arterial disease. ? ?Objective: ?AAO x3, NAD ?DP/PT pulses palpable, CRT less than 3 seconds ?Left below-knee amputation ?Hyperkeratotic lesion on the plantar medial aspect of the right hallux and after debridement is a granular wound is present with any probing to bone or tunneling and this is to the level of the fat layer.  There is no fluctuation or crepitation.  No malodor.  There is localized edema and erythema present dorsal aspect just proximal to the nail fold.  His wife states that at times it gets much worse than this but at times is also more normal in color. ?No pain with calf compression, swelling, warmth, erythema ? ? ? ? ? ? ?Assessment: ?Right hallux ulceration with erythema ? ?Plan: ?-All treatment options discussed with the patient including all alternatives, risks, complications.  ?-X-rays obtained reviewed.  Hallux IPJ arthrodesis noted.  No evidence of acute fracture or cortical changes suggestive of osteomyelitis. ?-Debridement of the hyperkeratotic lesion, wound without any complications down to healthy, granular tissue utilizing #312 with scalpel. ?-Concerned about ongoing nature of the wound as well as fluctuation of the cellulitis.  I ordered blood work today including CBC, BMP, sed rate, CRP.  He is  getting an updated A1c this month with his doctor.  I have also ordered an MRI to further evaluate. ?-Doxycycline ?-Unfortunately he is at risk of toe loss. ? ?Trula Slade DPM ? ?-Patient encouraged to call the office with any questions, concerns, change in symptoms.  ? ?

## 2021-04-26 ENCOUNTER — Ambulatory Visit: Payer: Medicare HMO | Admitting: Podiatry

## 2021-04-30 ENCOUNTER — Ambulatory Visit (INDEPENDENT_AMBULATORY_CARE_PROVIDER_SITE_OTHER): Payer: HMO | Admitting: Podiatry

## 2021-04-30 ENCOUNTER — Ambulatory Visit: Payer: Medicare HMO | Admitting: Cardiology

## 2021-04-30 ENCOUNTER — Other Ambulatory Visit: Payer: Self-pay

## 2021-04-30 DIAGNOSIS — L02611 Cutaneous abscess of right foot: Secondary | ICD-10-CM

## 2021-04-30 DIAGNOSIS — L03031 Cellulitis of right toe: Secondary | ICD-10-CM

## 2021-04-30 DIAGNOSIS — L97512 Non-pressure chronic ulcer of other part of right foot with fat layer exposed: Secondary | ICD-10-CM

## 2021-04-30 MED ORDER — DOXYCYCLINE HYCLATE 100 MG PO TABS: 100.0000 mg | ORAL_TABLET | Freq: Two times a day (BID) | ORAL | 0 refills | Status: DC

## 2021-05-02 ENCOUNTER — Ambulatory Visit: Payer: HMO | Admitting: Podiatry

## 2021-05-05 NOTE — Progress Notes (Signed)
Subjective: ?61 year old male presents the office today for follow-up evaluation of right foot hallux ulceration.  He did not pick up the doxycycline.  He has reported less swelling or redness and again states that it fluctuates as far as the severity goes.  Denies any fevers or chills.  He has no other concerns today. ? ?He previously had a hallux IPJ fusion.  ? ?History of left below-knee amputation ? ?Previous arterial studies October 15, 2020 on the right side revealed they were within normal limits.  No significant lower extremity arterial disease. ? ?Objective: ?AAO x3, NAD ?DP/PT pulses palpable, CRT less than 3 seconds ?Left below-knee amputation ?Hyperkeratotic lesion on the plantar medial aspect of the right hallux and after debridement is a granular wound is present.  The wound is somewhat smaller compared to last appointment in the same and shape when compared to the last pictures.  There is still some mild edema and erythema present mostly to the dorsal aspect of the toe but does appear to be somewhat improved.  No drainage or pus.  No fluctuation or crepitation.  No malodor. ?No pain with calf compression, swelling, warmth, erythema ? ? ?Assessment: ?Right hallux ulceration with erythema ? ?Plan: ?-All treatment options discussed with the patient including all alternatives, risks, complications.  ?-Sharply debrided the hyperkeratotic lesion, wound without any complications down to healthy, granular tissue utilizing #312 with scalpel.  I debrided the wound down to healthy, granular tissue.  There is no bleeding during the debridement.  Clean the area with saline.  Silvadene was applied followed by dressing. ?-Await MRI. ?-Doxycycline-refilled ?-Unfortunately he is at risk of toe loss. Monitor for any clinical signs or symptoms of infection and directed to call the office immediately should any occur or go to the ER. ? ?Trula Slade DPM ? ? ?

## 2021-05-06 ENCOUNTER — Telehealth: Payer: Self-pay | Admitting: *Deleted

## 2021-05-06 NOTE — Telephone Encounter (Signed)
Patient has been scheduled for MRI on April 12th @ 7:00 am,arrival time @ 6:30([facility]), patient has been notified of this appointment and given their number if he has to reschedule. ?

## 2021-05-06 NOTE — Telephone Encounter (Signed)
-----   Message from Trula Slade, DPM sent at 05/05/2021  5:14 PM EDT ----- ?Can you follow up on the MRI that I ordered? THANKS! ? ?

## 2021-05-09 DIAGNOSIS — N183 Chronic kidney disease, stage 3 unspecified: Secondary | ICD-10-CM | POA: Diagnosis not present

## 2021-05-09 DIAGNOSIS — N170 Acute kidney failure with tubular necrosis: Secondary | ICD-10-CM | POA: Diagnosis not present

## 2021-05-09 DIAGNOSIS — Z6833 Body mass index (BMI) 33.0-33.9, adult: Secondary | ICD-10-CM | POA: Diagnosis not present

## 2021-05-09 DIAGNOSIS — S88912A Complete traumatic amputation of left lower leg, level unspecified, initial encounter: Secondary | ICD-10-CM | POA: Diagnosis not present

## 2021-05-09 DIAGNOSIS — N189 Chronic kidney disease, unspecified: Secondary | ICD-10-CM | POA: Diagnosis not present

## 2021-05-09 DIAGNOSIS — E1165 Type 2 diabetes mellitus with hyperglycemia: Secondary | ICD-10-CM | POA: Diagnosis not present

## 2021-05-09 DIAGNOSIS — Z299 Encounter for prophylactic measures, unspecified: Secondary | ICD-10-CM | POA: Diagnosis not present

## 2021-05-09 DIAGNOSIS — E1122 Type 2 diabetes mellitus with diabetic chronic kidney disease: Secondary | ICD-10-CM | POA: Diagnosis not present

## 2021-05-14 ENCOUNTER — Ambulatory Visit (INDEPENDENT_AMBULATORY_CARE_PROVIDER_SITE_OTHER): Payer: HMO | Admitting: Podiatry

## 2021-05-14 DIAGNOSIS — L97512 Non-pressure chronic ulcer of other part of right foot with fat layer exposed: Secondary | ICD-10-CM | POA: Diagnosis not present

## 2021-05-15 ENCOUNTER — Ambulatory Visit (HOSPITAL_COMMUNITY)
Admission: RE | Admit: 2021-05-15 | Discharge: 2021-05-15 | Disposition: A | Discharge status: Home / Self Care | Payer: HMO | Source: Ambulatory Visit | Attending: Podiatry | Admitting: Podiatry

## 2021-05-15 DIAGNOSIS — L97512 Non-pressure chronic ulcer of other part of right foot with fat layer exposed: Secondary | ICD-10-CM | POA: Diagnosis not present

## 2021-05-15 DIAGNOSIS — S91301A Unspecified open wound, right foot, initial encounter: Secondary | ICD-10-CM | POA: Diagnosis not present

## 2021-05-15 NOTE — Progress Notes (Signed)
Subjective: ?61 year old male presents the office today for follow-up evaluation of right foot hallux ulceration.  He states that he is completing doxycycline scheduled for MRI tomorrow.  He states he been doing well without any drainage.  Denies any fevers, chills.  No chest pain or shortness of breath.  No other concerns. ? ?He previously had a hallux IPJ fusion.  He has been having intermittent swelling since the surgery. ? ?History of left below-knee amputation ? ?Previous arterial studies October 15, 2020 on the right side revealed they were within normal limits.  No significant lower extremity arterial disease. ? ?Objective: ?AAO x3, NAD ?DP/PT pulses palpable, CRT less than 3 seconds ?Left below-knee amputation ?Hyperkeratotic lesion on the plantar medial aspect of the right hallux and after debridement is a granular wound is present.  After debridement the wound measures 0.6 x 0.1 cm and is a superficial skin fissure type lesion.  There is no probing to bone or tunneling.  There is no surrounding erythema, ascending cellulitis.  No fluctuation or crepitation.  There is no malodor. ?No pain with calf compression, swelling, warmth, erythema ? ? ? ? ? ?Assessment: ?Right hallux ulceration with intermittent erythema ? ?Plan: ?-All treatment options discussed with the patient including all alternatives, risks, complications.  ?-Sharply debrided the hyperkeratotic lesion, wound without any complications down to healthy, granular tissue utilizing #312 with scalpel.  I debrided the wound down to healthy, granular tissue.  There is no bleeding during the debridement.  Clean the area with saline.  Silvadene was applied followed by dressing. ?-Await MR-scheduled for tomorrow ?-Doxycycline-completed ?-Unfortunately he is at risk of toe loss. Monitor for any clinical signs or symptoms of infection and directed to call the office immediately should any occur or go to the ER. ? ?Trula Slade DPM ? ? ?

## 2021-05-16 DIAGNOSIS — I5032 Chronic diastolic (congestive) heart failure: Secondary | ICD-10-CM | POA: Diagnosis not present

## 2021-05-16 DIAGNOSIS — E6609 Other obesity due to excess calories: Secondary | ICD-10-CM | POA: Diagnosis not present

## 2021-05-16 DIAGNOSIS — E1122 Type 2 diabetes mellitus with diabetic chronic kidney disease: Secondary | ICD-10-CM | POA: Diagnosis not present

## 2021-05-16 DIAGNOSIS — E211 Secondary hyperparathyroidism, not elsewhere classified: Secondary | ICD-10-CM | POA: Diagnosis not present

## 2021-05-16 DIAGNOSIS — I129 Hypertensive chronic kidney disease with stage 1 through stage 4 chronic kidney disease, or unspecified chronic kidney disease: Secondary | ICD-10-CM | POA: Diagnosis not present

## 2021-05-16 DIAGNOSIS — N189 Chronic kidney disease, unspecified: Secondary | ICD-10-CM | POA: Diagnosis not present

## 2021-05-17 ENCOUNTER — Other Ambulatory Visit: Payer: Self-pay | Admitting: Podiatry

## 2021-05-17 DIAGNOSIS — L97512 Non-pressure chronic ulcer of other part of right foot with fat layer exposed: Secondary | ICD-10-CM

## 2021-05-17 MED ORDER — DOXYCYCLINE HYCLATE 100 MG PO TABS: 100.0000 mg | ORAL_TABLET | Freq: Two times a day (BID) | ORAL | 0 refills | Status: DC

## 2021-05-28 ENCOUNTER — Ambulatory Visit: Payer: HMO

## 2021-05-28 ENCOUNTER — Other Ambulatory Visit: Payer: Self-pay | Admitting: Podiatry

## 2021-05-28 ENCOUNTER — Ambulatory Visit (INDEPENDENT_AMBULATORY_CARE_PROVIDER_SITE_OTHER): Payer: HMO | Admitting: Podiatry

## 2021-05-28 DIAGNOSIS — L97512 Non-pressure chronic ulcer of other part of right foot with fat layer exposed: Secondary | ICD-10-CM | POA: Diagnosis not present

## 2021-05-28 NOTE — Progress Notes (Signed)
Subjective: ?61 year old male presents the office today for follow-up evaluation of right foot hallux ulceration.  States he is still been having some bleeding present but no pus that he is noted.  No swelling or redness otherwise.  Denies any fevers or chills.  Also recently MRI performed.  No other concerns.  ? ?He previously had a hallux IPJ fusion.  He has been having intermittent swelling since the surgery. ? ?History of left below-knee amputation ? ?Previous arterial studies October 15, 2020 on the right side revealed they were within normal limits.  No significant lower extremity arterial disease. ? ?Objective: ?AAO x3, NAD ?DP/PT pulses palpable, CRT less than 3 seconds ?Left below-knee amputation ?Hyperkeratotic lesion on the plantar medial aspect of the right hallux and after debridement is a granular wound is present.  After debridement the wound measures 0.6 x 0.3 cm somewhat increased in size and there is no probing, undermining or tunneling.  No fluctuance or crepitation but there is no malodor. ?No pain with calf compression, swelling, warmth, erythema ? ? ? ? ? ? ?Assessment: ?Right hallux ulceration with intermittent erythema ? ?Plan: ?-All treatment options discussed with the patient including all alternatives, risks, complications.  ?-Sharply debrided the hyperkeratotic lesion, wound without any complications down to healthy, granular tissue utilizing #312 with scalpel.  I debrided the wound down to healthy, granular tissue.  There is no bleeding during the debridement.  Clean the area with saline.  Silvadene was applied followed by dressing. ?-Reviewed MRI.  No evidence of acute ulcers but there is no swelling noted which could be reflective of early osteomyelitis.  Given this I have ordered blood work to recheck CBC, CRP, sed rate. ?-Unfortunately he is at risk of toe loss.  Consider surgery to help decrease pressure off the toe.  This did not start till after the IPJ fusion.  Monitor for any  clinical signs or symptoms of infection and directed to call the office immediately should any occur or go to the ER. ? ?Trula Slade DPM ? ? ?

## 2021-05-28 NOTE — Progress Notes (Signed)
Patient was seen for offloading of custom insole and did not bring his insole today for offloading. Patient is to reschedule in conjunction with his next appointment with Dr. Jacqualyn Posey. All questions answered and concerns addressed. ?

## 2021-05-29 LAB — CBC WITH DIFFERENTIAL/PLATELET
Absolute Monocytes: 510 cells/uL (ref 200–950)
Basophils Absolute: 120 cells/uL (ref 0–200)
Basophils Relative: 2.3 %
Eosinophils Absolute: 520 cells/uL — ABNORMAL HIGH (ref 15–500)
Eosinophils Relative: 10 %
HCT: 44.1 % (ref 38.5–50.0)
Hemoglobin: 15.1 g/dL (ref 13.2–17.1)
Lymphs Abs: 1732 cells/uL (ref 850–3900)
MCH: 29.7 pg (ref 27.0–33.0)
MCHC: 34.2 g/dL (ref 32.0–36.0)
MCV: 86.6 fL (ref 80.0–100.0)
MPV: 12.5 fL (ref 7.5–12.5)
Monocytes Relative: 9.8 %
Neutro Abs: 2319 cells/uL (ref 1500–7800)
Neutrophils Relative %: 44.6 %
Platelets: 154 10*3/uL (ref 140–400)
RBC: 5.09 10*6/uL (ref 4.20–5.80)
RDW: 13.1 % (ref 11.0–15.0)
Total Lymphocyte: 33.3 %
WBC: 5.2 10*3/uL (ref 3.8–10.8)

## 2021-05-29 LAB — C-REACTIVE PROTEIN: CRP: 3.7 mg/L (ref <8.0)

## 2021-05-29 LAB — SEDIMENTATION RATE: Sed Rate: 6 mm/h (ref 0–20)

## 2021-06-11 ENCOUNTER — Ambulatory Visit (INDEPENDENT_AMBULATORY_CARE_PROVIDER_SITE_OTHER): Payer: HMO | Admitting: Podiatry

## 2021-06-11 ENCOUNTER — Other Ambulatory Visit: Payer: Self-pay | Admitting: Cardiology

## 2021-06-11 ENCOUNTER — Ambulatory Visit: Payer: HMO

## 2021-06-11 DIAGNOSIS — L97512 Non-pressure chronic ulcer of other part of right foot with fat layer exposed: Secondary | ICD-10-CM

## 2021-06-11 DIAGNOSIS — E1159 Type 2 diabetes mellitus with other circulatory complications: Secondary | ICD-10-CM

## 2021-06-11 NOTE — Progress Notes (Signed)
SITUATION ?Reason for Consult: Follow-up with right wound healing shoe ?Patient / Caregiver Report: Patient did not bring the shoe with him today ? ?OBJECTIVE DATA ?History / Diagnosis:  ?  ICD-10-CM   ?1. Type 2 diabetes mellitus with vascular disease (HCC)  E11.59   ?  ?2. Ulcer of toe of right foot, with fat layer exposed ([place])  L97.512   ?  ? ? ?Change in Pathology: None ? ?ACTIONS PERFORMED ?Recommended patient return and see me after his 2 wk follow up with Dr. Jacqualyn Posey where we will adjust his wound healing sandal and eval for diabetic shoes. All questions answered and concerns addressed. ? ?PLAN ?Follow-up as needed (PRN). Plan of care discussed with and agreed upon by patient / caregiver. ? ?

## 2021-06-13 NOTE — Progress Notes (Signed)
Subjective: ?61 year old male presents the office today for follow-up evaluation of right foot hallux ulceration.  States he is doing well but still gets some bleeding.  He is continue to wear regular shoe.  He feels the inserts that he has to offload it causes his heel to slip.  Denies any increase in swelling or redness.  No drainage or pus otherwise.  No fevers or chills that he reports.  No other concerns.  ? ?He previously had a hallux IPJ fusion.  He has been having intermittent swelling since the surgery. ? ?History of left below-knee amputation ? ?Previous arterial studies October 15, 2020 on the right side revealed they were within normal limits.  No significant lower extremity arterial disease. ? ?Objective: ?AAO x3, NAD ?DP/PT pulses palpable, CRT less than 3 seconds ?Left below-knee amputation ?Hyperkeratotic lesion on the plantar medial aspect of the right hallux and after debridement is a granular wound is present.  After debridement the wound still measures 0.6 x 0.3 cm.  There is no probing, undermining or tunneling.  No surrounding erythema, ascending cellulitis.  Hyperkeratotic periwound.  No fluctuance or crepitation but there is no malodor. ?No pain with calf compression, swelling, warmth, erythema ? ? ? ? ? ? ? ?Assessment: ?Right hallux ulceration with intermittent erythema ? ?Plan: ?-All treatment options discussed with the patient including all alternatives, risks, complications.  ?-Sharply debrided the hyperkeratotic lesion, wound without any complications down to healthy, granular tissue utilizing #312 with scalpel.  I debrided the wound down to healthy, granular tissue.  There is no bleeding during the debridement.  Clean the area with saline.  Silvadene was applied followed by dressing. ?-We need to do better job of offloading the wound.  I have followed with Aaron Edelman but not able to do any other instructions other than his current shoes.  He is in a brace inserts and that he has at home  and will work on changing shoes as well. ?-Unfortunately he is at risk of toe loss.  Consider surgery to help decrease pressure off the toe.  This did not start till after the IPJ fusion.  Monitor for any clinical signs or symptoms of infection and directed to call the office immediately should any occur or go to the ER. ? ?Trula Slade DPM ? ? ?

## 2021-06-14 ENCOUNTER — Other Ambulatory Visit: Payer: Self-pay | Admitting: Cardiology

## 2021-06-24 ENCOUNTER — Ambulatory Visit (INDEPENDENT_AMBULATORY_CARE_PROVIDER_SITE_OTHER): Payer: HMO | Admitting: Podiatry

## 2021-06-24 ENCOUNTER — Ambulatory Visit (INDEPENDENT_AMBULATORY_CARE_PROVIDER_SITE_OTHER): Payer: HMO

## 2021-06-24 ENCOUNTER — Ambulatory Visit: Payer: HMO

## 2021-06-24 DIAGNOSIS — E1159 Type 2 diabetes mellitus with other circulatory complications: Secondary | ICD-10-CM

## 2021-06-24 DIAGNOSIS — M2011 Hallux valgus (acquired), right foot: Secondary | ICD-10-CM

## 2021-06-24 DIAGNOSIS — Z89512 Acquired absence of left leg below knee: Secondary | ICD-10-CM

## 2021-06-24 DIAGNOSIS — L97512 Non-pressure chronic ulcer of other part of right foot with fat layer exposed: Secondary | ICD-10-CM

## 2021-06-24 NOTE — Progress Notes (Signed)
SITUATION Reason for Consult: Evaluation for Prefabricated Diabetic Shoes and Custom Diabetic Inserts. Patient / Caregiver Report: Patient would like well fitting shoes  OBJECTIVE DATA: Patient History / Diagnosis:    ICD-10-CM   1. Type 2 diabetes mellitus with vascular disease (HCC)  E11.59     2. Acquired absence of left leg below knee [facility])  L07.867       Physician Treating Diabetes:  Dr. Weldon Picking C. Manuella Ghazi, MD  Current or Previous Devices:   Historical user  In-Person Foot Examination: Ulcers & Callousing:   Historical Deformities:    Left BKA Sensation:    Compromsied  Shoe Size:     13W  ORTHOTIC RECOMMENDATION Recommended Devices: - 1x pair prefabricated PDAC approved diabetic shoes; Patient Selected Orthofeet 28 Grey Size 13W - 3x pair custom-to-patient PDAC approved vacuum formed diabetic insoles.  GOALS OF SHOES AND INSOLES - Reduce shear and pressure - Reduce / Prevent callus formation - Reduce / Prevent ulceration - Protect the fragile healing compromised diabetic foot.  Patient would benefit from diabetic shoes and inserts as patient has diabetes mellitus and the patient has one or more of the following conditions: - History of partial or complete amputation of the foot - History of previous foot ulceration. - History of pre-ulcerative callus - Peripheral neuropathy with evidence of callus formation - Foot deformity - Poor circulation  ACTIONS PERFORMED Potential out of pocket cost was communicated to patient. Patient understood and consented to measurement and casting. Patient was casted for insoles via crush box and measured for shoes via brannock device. Procedure was explained and patient tolerated procedure well. All questions were answered and concerns addressed. CMN submitted to treating physician. Casts were shipped to central fabrication for HOLD until Certificate of Medical Necessity or otherwise necessary authorization from insurance is  obtained.  PLAN Shoes are to be ordered and casts released from hold once all appropriate paperwork is complete. Patient is to be contacted and scheduled for fitting once shoes and insoles have been fabricated and received.

## 2021-06-25 NOTE — Progress Notes (Signed)
Subjective: 61 year old male presents the office today for follow-up evaluation of right foot hallux ulceration.  He states that he is doing great.  He has minimal bloody drainage but no pus.  He gets some occasional discomfort of the toe.  No increase in swelling or redness.  No recent fevers or chills.  He has no other concerns today.   He previously had a hallux IPJ fusion.  He has been having intermittent swelling since the surgery.  History of left below-knee amputation  Previous arterial studies October 15, 2020 on the right side revealed they were within normal limits.  No significant lower extremity arterial disease.  Objective: AAO x3, NAD DP/PT pulses palpable, CRT less than 3 seconds Left below-knee amputation Hyperkeratotic lesion on the plantar medial aspect of the right hallux and after debridement is a granular wound is present.  After debridement the wound still measures 0.6 x 0.2 x 0.1 cm.  There is no probing, undermining or tunneling.  No surrounding erythema, ascending cellulitis.  Hyperkeratotic periwound.  No fluctuance or crepitation but there is no malodor. Upon weightbearing evaluation he does have slight valgus deformity noted to the hallux which is likely contributed to the wound given the pressure. No pain with calf compression, swelling, warmth, erythema         Assessment: Right hallux ulceration with intermittent erythema  Plan: -All treatment options discussed with the patient including all alternatives, risks, complications.  -Sharply debrided the hyperkeratotic lesion, wound without any complications down to healthy, granular tissue utilizing #312 with scalpel.  I debrided the wound down to healthy, granular tissue.  There is no bleeding during the debridement.  Clean the area with saline.  Silvadene was applied followed by dressing.  Continue daily dressing changes. -We discussed other treatment options given ongoing nature of the wound.  I do think  this is due to the pressure given the valgus deformity of the hallux.  I discussed with him return to surgery versus conservative care.  He has not been wearing inserts that may not been fitting appropriately.  He was measured for new inserts today and diabetic shoes.  Hope this will help eliminate the pressure to facilitate wound healing.  Also discussed return to the OR for surgery.  However we discussed shaving the bone versus revision of the arthrodesis given the varus deformity.  I am hesitant to proceed with revision of the surgery and hardware placement given the open wound however if were not able to get the wound to heal would likely start with the shaving the bone underneath it. -Monitor for any clinical signs or symptoms of infection and directed to call the office immediately should any occur or go to the ER.  Return in about 2 weeks (around 07/08/2021).  Trula Slade DPM

## 2021-07-08 ENCOUNTER — Ambulatory Visit (INDEPENDENT_AMBULATORY_CARE_PROVIDER_SITE_OTHER): Payer: HMO | Admitting: Podiatry

## 2021-07-08 DIAGNOSIS — E1159 Type 2 diabetes mellitus with other circulatory complications: Secondary | ICD-10-CM | POA: Diagnosis not present

## 2021-07-08 DIAGNOSIS — L97512 Non-pressure chronic ulcer of other part of right foot with fat layer exposed: Secondary | ICD-10-CM | POA: Diagnosis not present

## 2021-07-09 DIAGNOSIS — S88912A Complete traumatic amputation of left lower leg, level unspecified, initial encounter: Secondary | ICD-10-CM | POA: Diagnosis not present

## 2021-07-09 DIAGNOSIS — Z1331 Encounter for screening for depression: Secondary | ICD-10-CM | POA: Diagnosis not present

## 2021-07-09 DIAGNOSIS — Z6833 Body mass index (BMI) 33.0-33.9, adult: Secondary | ICD-10-CM | POA: Diagnosis not present

## 2021-07-09 DIAGNOSIS — Z299 Encounter for prophylactic measures, unspecified: Secondary | ICD-10-CM | POA: Diagnosis not present

## 2021-07-09 DIAGNOSIS — Z7189 Other specified counseling: Secondary | ICD-10-CM | POA: Diagnosis not present

## 2021-07-09 DIAGNOSIS — Z0000 Encounter for general adult medical examination without abnormal findings: Secondary | ICD-10-CM | POA: Diagnosis not present

## 2021-07-09 DIAGNOSIS — R5383 Other fatigue: Secondary | ICD-10-CM | POA: Diagnosis not present

## 2021-07-09 DIAGNOSIS — E1165 Type 2 diabetes mellitus with hyperglycemia: Secondary | ICD-10-CM | POA: Diagnosis not present

## 2021-07-11 ENCOUNTER — Telehealth: Payer: Self-pay

## 2021-07-11 DIAGNOSIS — E7800 Pure hypercholesterolemia, unspecified: Secondary | ICD-10-CM | POA: Diagnosis not present

## 2021-07-11 DIAGNOSIS — R5383 Other fatigue: Secondary | ICD-10-CM | POA: Diagnosis not present

## 2021-07-11 DIAGNOSIS — Z125 Encounter for screening for malignant neoplasm of prostate: Secondary | ICD-10-CM | POA: Diagnosis not present

## 2021-07-11 DIAGNOSIS — Z79899 Other long term (current) drug therapy: Secondary | ICD-10-CM | POA: Diagnosis not present

## 2021-07-11 NOTE — Telephone Encounter (Signed)
CMN Received - Shoes ordered and casts released from fabrication hold.  

## 2021-07-15 NOTE — Progress Notes (Signed)
Subjective: 61 year old male presents the office today for follow-up evaluation of right foot hallux ulceration.  He states that he is doing better and he feels that the wound is almost healed.  He has not seen any drainage or pus.  No swelling or redness.  Denies any fevers or chills.   He previously had a hallux IPJ fusion.  He has been having intermittent swelling since the surgery.  History of left below-knee amputation  Previous arterial studies October 15, 2020 on the right side revealed they were within normal limits.  No significant lower extremity arterial disease.  Objective: AAO x3, NAD DP/PT pulses palpable, CRT less than 3 seconds Left below-knee amputation Hyperkeratotic lesion on the plantar medial aspect of the right hallux and after debridement is a granular wound is present.  Wound measures somewhat smaller at 0.5 x 0.2 x 0.1 cm without any probing, or tunneling.  Discussed with the patient the wound is still present.  There is no edema, erythema or signs of infection.  No fluctuance or crepitation.  No malodor. Upon weightbearing evaluation he does have slight valgus deformity noted to the hallux which is likely contributed to the wound given the pressure. No pain with calf compression, swelling, warmth, erythema       Assessment: Right hallux ulceration with intermittent erythema  Plan: -All treatment options discussed with the patient including all alternatives, risks, complications.  -There is, she should debride today.  He feels that the wound is healed discussed that still open.  Discussed continue daily dressing changes, offloading at all times.  Can discuss surgical intervention if needed however decided to hold off on that for now. -Monitor for any clinical signs or symptoms of infection and directed to call the office immediately should any occur or go to the ER.  Return in about 4 weeks (around 08/05/2021).  Trula Slade DPM

## 2021-07-16 DIAGNOSIS — N189 Chronic kidney disease, unspecified: Secondary | ICD-10-CM | POA: Diagnosis not present

## 2021-07-16 DIAGNOSIS — N186 End stage renal disease: Secondary | ICD-10-CM | POA: Diagnosis not present

## 2021-07-16 DIAGNOSIS — E1122 Type 2 diabetes mellitus with diabetic chronic kidney disease: Secondary | ICD-10-CM | POA: Diagnosis not present

## 2021-07-19 DIAGNOSIS — N183 Chronic kidney disease, stage 3 unspecified: Secondary | ICD-10-CM | POA: Diagnosis not present

## 2021-07-19 DIAGNOSIS — L720 Epidermal cyst: Secondary | ICD-10-CM | POA: Diagnosis not present

## 2021-07-19 DIAGNOSIS — E1122 Type 2 diabetes mellitus with diabetic chronic kidney disease: Secondary | ICD-10-CM | POA: Diagnosis not present

## 2021-07-19 DIAGNOSIS — I208 Other forms of angina pectoris: Secondary | ICD-10-CM | POA: Diagnosis not present

## 2021-07-19 DIAGNOSIS — Z299 Encounter for prophylactic measures, unspecified: Secondary | ICD-10-CM | POA: Diagnosis not present

## 2021-07-22 IMAGING — CT CT RENAL STONE PROTOCOL
2 of 4 series · 16 of 46 positions shown, 18 images · non-contrast
Comparison: CT 06/30/2019

CLINICAL DATA: Right-sided flank pain

EXAM:
CT ABDOMEN AND PELVIS WITHOUT CONTRAST
TECHNIQUE: Multidetector CT imaging of the abdomen and pelvis was performed
following the standard protocol without IV contrast.

[Series 2: axial st · axial · 0.90mm/px · z∈[+866,+1346]mm · 13 of 110 slices shown, 15 images]
[im 7/110  soft-tissue]
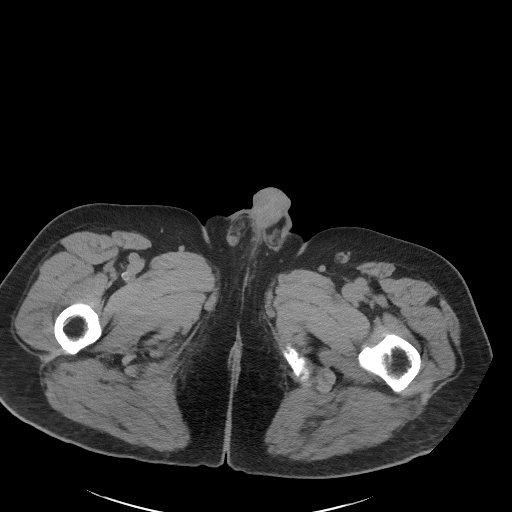
[im 7/110  bone]
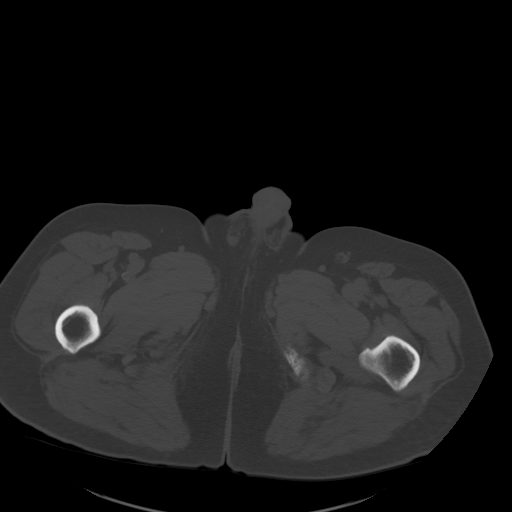
[im 13/110  soft-tissue]
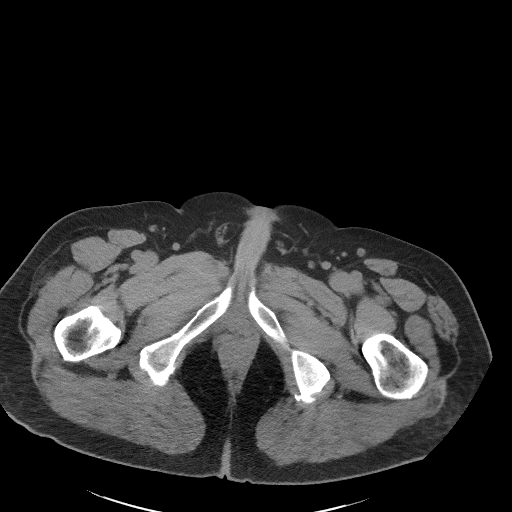
[im 25/110  soft-tissue]
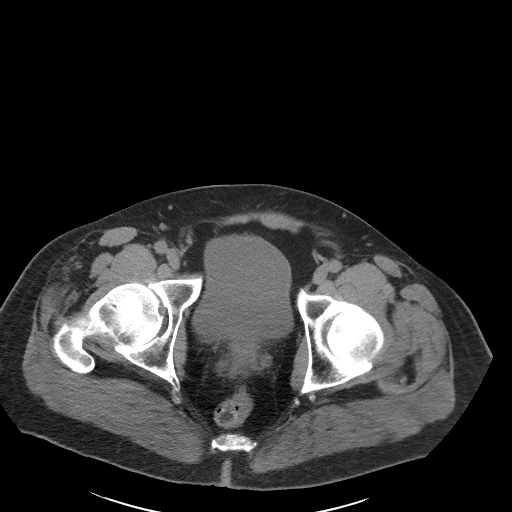
[im 31/110  soft-tissue]
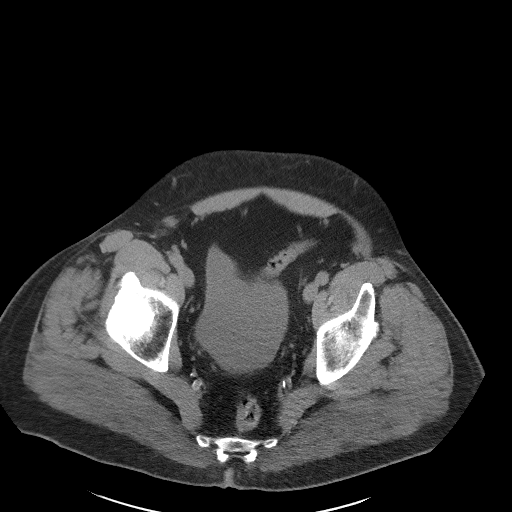
[im 37/110  soft-tissue]
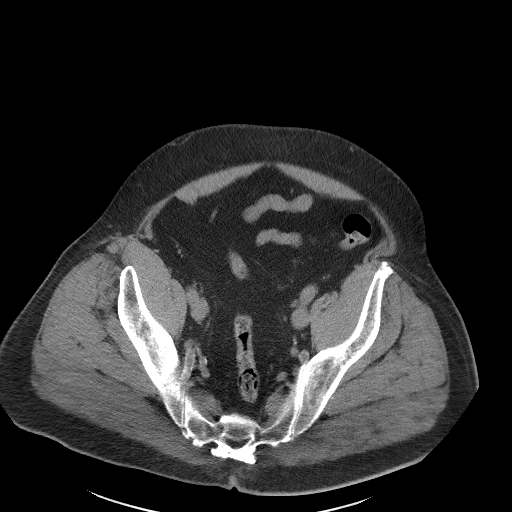
[im 49/110  soft-tissue]
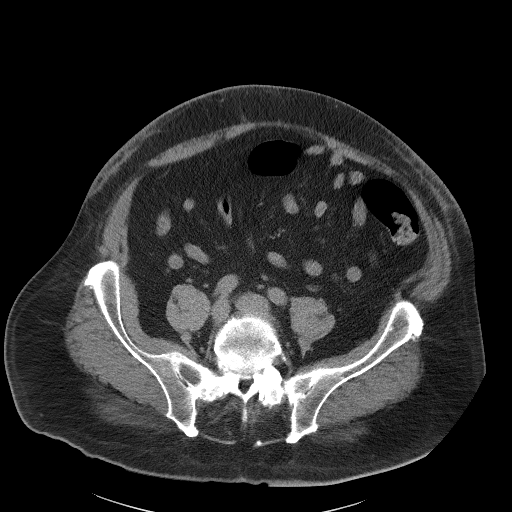
[im 55/110  soft-tissue]
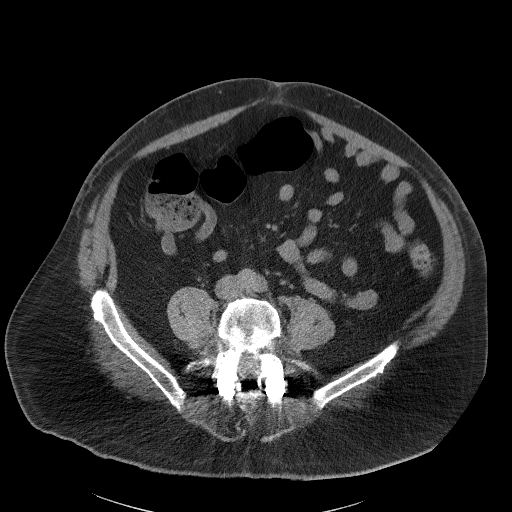
[im 61/110  soft-tissue]
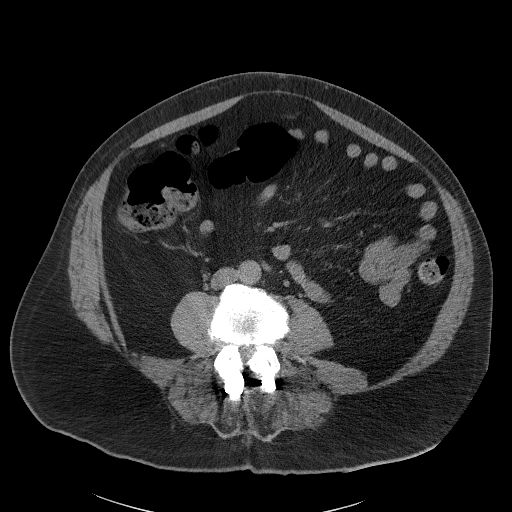
[im 73/110  soft-tissue]
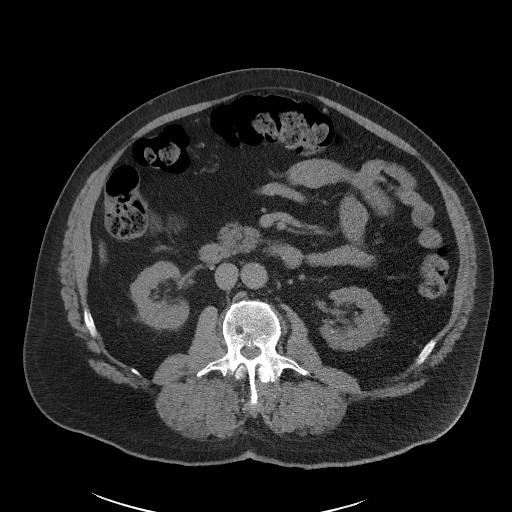
[im 73/110  bone]
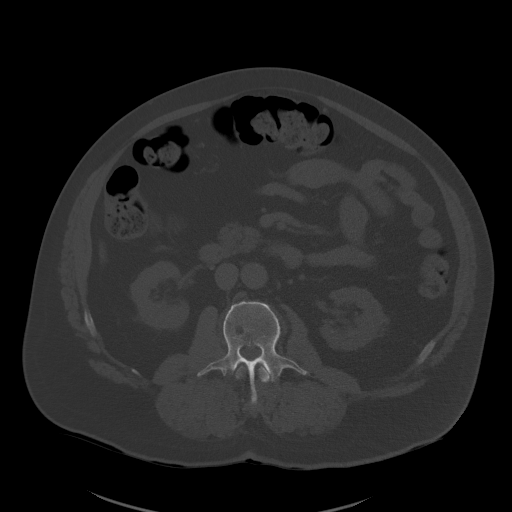
[im 79/110  soft-tissue]
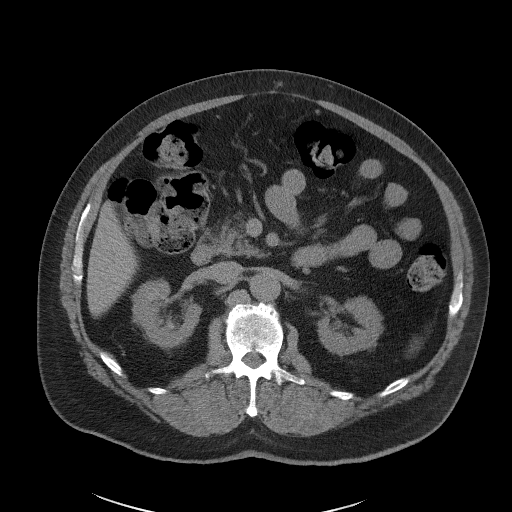
[im 85/110  soft-tissue]
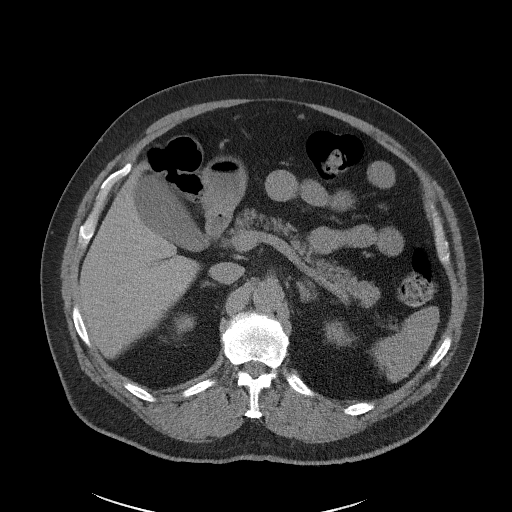
[im 97/110  soft-tissue]
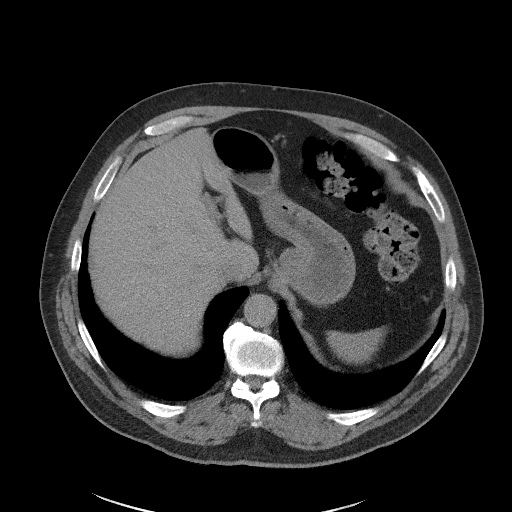
[im 103/110  soft-tissue]
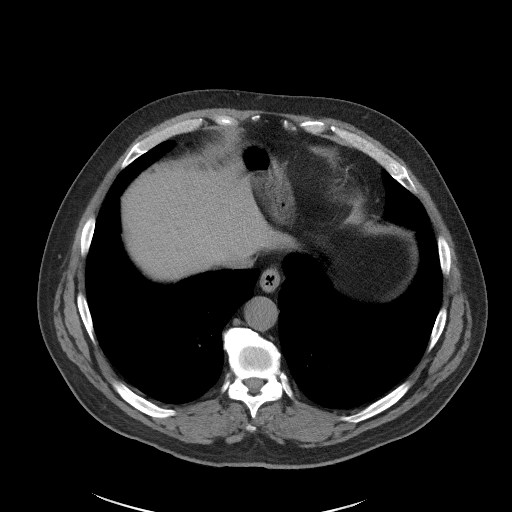

[Series 5: coronal st · coronal · 0.92mm/px · 3 of 151 slices shown]
[im 51/151  soft-tissue]
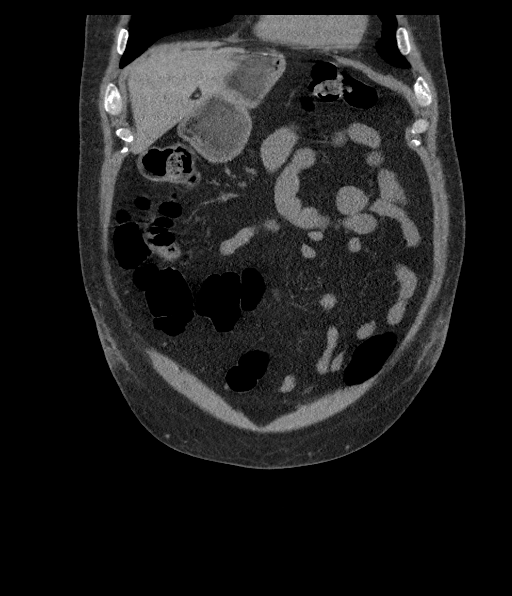
[im 67/151  soft-tissue]
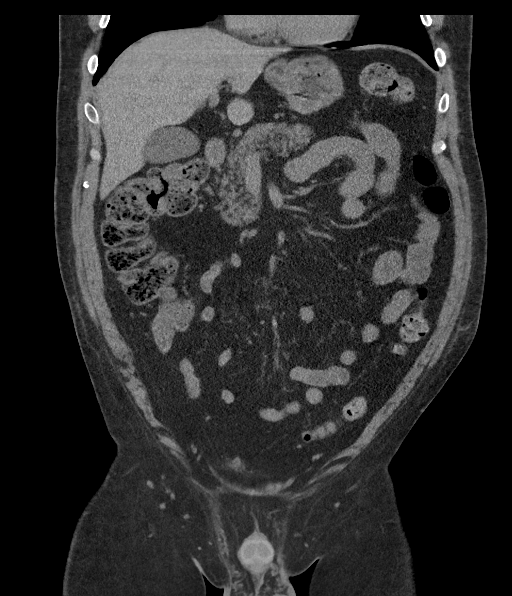
[im 84/151  soft-tissue]
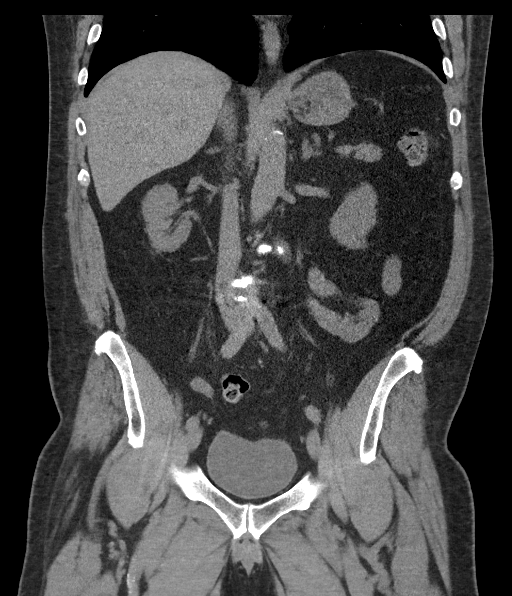

[16 of 46 positions shown; findings below may reference images not displayed]

FINDINGS: Lower chest: Lung bases demonstrate no acute consolidation or
effusion. Normal cardiac size.

Hepatobiliary: No focal liver abnormality is seen. No gallstones,
gallbladder wall thickening, or biliary dilatation.

Pancreas: Unremarkable. No pancreatic ductal dilatation or
surrounding inflammatory changes.

Spleen: Normal in size without focal abnormality.

Adrenals/Urinary Tract: Adrenal glands are normal. Punctate
intrarenal stones on the right. No hydronephrosis or ureteral stone.
The bladder is normal.

Stomach/Bowel: Stomach is within normal limits. Appendix not seen
but no right lower quadrant inflammatory process. No evidence of
bowel wall thickening, distention, or inflammatory changes.

Vascular/Lymphatic: Mild aortic atherosclerosis. No aneurysm. No
suspicious nodes.

Reproductive: Prostate is unremarkable.

Other: Negative for free air or free fluid.

Musculoskeletal: Post fusion changes L4 and L5. No acute osseous
abnormality.
IMPRESSION: 1. Punctate intrarenal stones on the right. No hydronephrosis or
ureteral stone.
2. No CT evidence for acute intra-abdominal or pelvic abnormality.

Aortic Atherosclerosis ([id number]).

## 2021-07-24 DIAGNOSIS — I5032 Chronic diastolic (congestive) heart failure: Secondary | ICD-10-CM | POA: Diagnosis not present

## 2021-07-24 DIAGNOSIS — N189 Chronic kidney disease, unspecified: Secondary | ICD-10-CM | POA: Diagnosis not present

## 2021-07-24 DIAGNOSIS — I129 Hypertensive chronic kidney disease with stage 1 through stage 4 chronic kidney disease, or unspecified chronic kidney disease: Secondary | ICD-10-CM | POA: Diagnosis not present

## 2021-07-24 DIAGNOSIS — E211 Secondary hyperparathyroidism, not elsewhere classified: Secondary | ICD-10-CM | POA: Diagnosis not present

## 2021-07-24 DIAGNOSIS — E1122 Type 2 diabetes mellitus with diabetic chronic kidney disease: Secondary | ICD-10-CM | POA: Diagnosis not present

## 2021-08-01 DIAGNOSIS — L720 Epidermal cyst: Secondary | ICD-10-CM | POA: Diagnosis not present

## 2021-08-12 ENCOUNTER — Ambulatory Visit (INDEPENDENT_AMBULATORY_CARE_PROVIDER_SITE_OTHER): Payer: HMO | Admitting: Podiatry

## 2021-08-12 ENCOUNTER — Ambulatory Visit (INDEPENDENT_AMBULATORY_CARE_PROVIDER_SITE_OTHER): Payer: HMO

## 2021-08-12 DIAGNOSIS — Z89512 Acquired absence of left leg below knee: Secondary | ICD-10-CM

## 2021-08-12 DIAGNOSIS — E1159 Type 2 diabetes mellitus with other circulatory complications: Secondary | ICD-10-CM

## 2021-08-12 DIAGNOSIS — L97512 Non-pressure chronic ulcer of other part of right foot with fat layer exposed: Secondary | ICD-10-CM

## 2021-08-12 NOTE — Progress Notes (Signed)
Reason for Visit:                   Fitting of Diabetic Shoes & Insoles Patient / Caregiver Report:     Patient is satisfied with fit and function of shoes and insoles.  ACTIONS PERFORMED Shoes and insoles were verified for structural integrity and safety. Patient wore shoes and insoles in office. Skin was inspected and free of areas of concern after wearing shoes and inserts. Shoes and inserts fit properly.  Patient / Caregiver provided with verbal instruction and demonstration regarding wear, care, proper fit, function, purpose, cleaning, and use of shoes and insoles and in all related precautions and risks and benefits regarding shoes and insoles. Patient / Caregiver was instructed to wear properly fitting socks with shoes at all times. Patient was also provided with verbal instruction regarding how to report any failures or malfunctions of shoes or inserts, and necessary follow up care. Patient / Caregiver was also instructed to contact physician regarding change in status that may affect function of shoes and inserts.    Patient / Caregiver verbalized undersatnding of instruction provided. Patient / Caregiver demonstrated independence with proper donning and doffing of shoes and inserts.   PLAN Patient is to follow up in one week or as necessary (PRN). All questions were answered and concerns addressed.  

## 2021-08-13 NOTE — Progress Notes (Signed)
Subjective: 61 year old male presents the office today for follow-up evaluation of right foot hallux ulceration.  He states that he does get some bleeding when he wears his Sunday shoes or certain shoes but overall doing well.  No pain or any current drainage or pus.  Denies any fevers or chills.  He previously had a hallux IPJ fusion.  He has been having intermittent swelling since the surgery.  History of left below-knee amputation  Previous arterial studies October 15, 2020 on the right side revealed they were within normal limits.  No significant lower extremity arterial disease.  Objective: AAO x3, NAD DP/PT pulses palpable, CRT less than 3 seconds Left below-knee amputation Hyperkeratotic lesion on the plantar medial aspect of the right hallux and after debridement is a granular wound is present.  Wound measures somewhat smaller at 0.4 x 0.2 x 0.1 cm without any probing, or tunneling.  Wound is slightly smaller but still present.  There is hyperkeratotic tissue covering some of the wound.  Minimal chronic edema and mild erythema which has been intermittent.  Has been ongoing for the hallux IPJ fusion.  There is no fluctuation or crepitation but there is no malodor. No pain with calf compression, swelling, warmth, erythema          Assessment: Right hallux ulceration with intermittent erythema  Plan: -All treatment options discussed with the patient including all alternatives, risks, complications. -Patient debrided the wound utilizing a #312 with scalpel down to healthy, bleeding, granular tissue in order to promote wound healing.  I was able to debride the nonviable tissue in order to promote wound healing.  There is no significant blood loss.  She tolerated the procedure well any complications.  Cleansed the area with saline.  Antibiotic ointment and bandage applied.  Continue daily dressing changes and offloading.  Discussed that even when he wears other shoes he needs to  properly offload it in order to promote wound healing.  Discussed surgical intervention again today.  Trula Slade DPM

## 2021-08-14 ENCOUNTER — Encounter: Payer: Self-pay | Admitting: Cardiology

## 2021-08-14 DIAGNOSIS — E1165 Type 2 diabetes mellitus with hyperglycemia: Secondary | ICD-10-CM | POA: Diagnosis not present

## 2021-08-14 DIAGNOSIS — Z6833 Body mass index (BMI) 33.0-33.9, adult: Secondary | ICD-10-CM | POA: Diagnosis not present

## 2021-08-14 DIAGNOSIS — Z299 Encounter for prophylactic measures, unspecified: Secondary | ICD-10-CM | POA: Diagnosis not present

## 2021-08-14 DIAGNOSIS — Z713 Dietary counseling and surveillance: Secondary | ICD-10-CM | POA: Diagnosis not present

## 2021-08-14 DIAGNOSIS — Z794 Long term (current) use of insulin: Secondary | ICD-10-CM | POA: Diagnosis not present

## 2021-08-21 DIAGNOSIS — E1165 Type 2 diabetes mellitus with hyperglycemia: Secondary | ICD-10-CM | POA: Diagnosis not present

## 2021-08-21 DIAGNOSIS — Z794 Long term (current) use of insulin: Secondary | ICD-10-CM | POA: Diagnosis not present

## 2021-08-21 DIAGNOSIS — Z6833 Body mass index (BMI) 33.0-33.9, adult: Secondary | ICD-10-CM | POA: Diagnosis not present

## 2021-08-21 DIAGNOSIS — J069 Acute upper respiratory infection, unspecified: Secondary | ICD-10-CM | POA: Diagnosis not present

## 2021-08-21 DIAGNOSIS — Z299 Encounter for prophylactic measures, unspecified: Secondary | ICD-10-CM | POA: Diagnosis not present

## 2021-09-02 ENCOUNTER — Ambulatory Visit (INDEPENDENT_AMBULATORY_CARE_PROVIDER_SITE_OTHER): Payer: PPO | Admitting: Podiatry

## 2021-09-02 DIAGNOSIS — L309 Dermatitis, unspecified: Secondary | ICD-10-CM

## 2021-09-02 DIAGNOSIS — L97512 Non-pressure chronic ulcer of other part of right foot with fat layer exposed: Secondary | ICD-10-CM

## 2021-09-02 DIAGNOSIS — E7800 Pure hypercholesterolemia, unspecified: Secondary | ICD-10-CM | POA: Diagnosis not present

## 2021-09-02 DIAGNOSIS — I10 Essential (primary) hypertension: Secondary | ICD-10-CM | POA: Diagnosis not present

## 2021-09-02 MED ORDER — DOXYCYCLINE HYCLATE 100 MG PO TABS: 100.0000 mg | ORAL_TABLET | Freq: Two times a day (BID) | ORAL | 0 refills | Status: DC

## 2021-09-02 NOTE — Progress Notes (Unsigned)
Subjective: Started bleeding yesterday.  Still mowing and cannot do surgery until after Denies any systemic complaints such as fevers, chills, nausea, vomiting. No acute changes since last appointment, and no other complaints at this time.   Objective: AAO x3, NAD DP/PT pulses palpable bilaterally, CRT less than 3 seconds Multiple red bumps on lesser toes and anterior ankle No pain with calf compression, swelling, warmth, erythema  Assessment:  Plan: -All treatment options discussed with the patient including all alternatives, risks, complications.   -Patient encouraged to call the office with any questions, concerns, change in symptoms.

## 2021-09-04 DIAGNOSIS — Z713 Dietary counseling and surveillance: Secondary | ICD-10-CM | POA: Diagnosis not present

## 2021-09-04 DIAGNOSIS — E1165 Type 2 diabetes mellitus with hyperglycemia: Secondary | ICD-10-CM | POA: Diagnosis not present

## 2021-09-04 DIAGNOSIS — Z299 Encounter for prophylactic measures, unspecified: Secondary | ICD-10-CM | POA: Diagnosis not present

## 2021-09-04 DIAGNOSIS — Z6833 Body mass index (BMI) 33.0-33.9, adult: Secondary | ICD-10-CM | POA: Diagnosis not present

## 2021-09-04 DIAGNOSIS — Z794 Long term (current) use of insulin: Secondary | ICD-10-CM | POA: Diagnosis not present

## 2021-09-05 ENCOUNTER — Other Ambulatory Visit: Payer: Self-pay | Admitting: Podiatry

## 2021-09-05 DIAGNOSIS — L97512 Non-pressure chronic ulcer of other part of right foot with fat layer exposed: Secondary | ICD-10-CM | POA: Diagnosis not present

## 2021-09-06 LAB — BASIC METABOLIC PANEL WITH GFR
BUN/Creatinine Ratio: 14 (calc) (ref 6–22)
BUN: 34 mg/dL — ABNORMAL HIGH (ref 7–25)
CO2: 20 mmol/L (ref 20–32)
Calcium: 9.6 mg/dL (ref 8.6–10.3)
Chloride: 108 mmol/L (ref 98–110)
Creat: 2.37 mg/dL — ABNORMAL HIGH (ref 0.70–1.35)
Glucose, Bld: 178 mg/dL — ABNORMAL HIGH (ref 65–139)
Potassium: 4.5 mmol/L (ref 3.5–5.3)
Sodium: 139 mmol/L (ref 135–146)
eGFR: 30 mL/min/{1.73_m2} — ABNORMAL LOW (ref >=60)

## 2021-09-06 LAB — CBC WITH DIFFERENTIAL/PLATELET
Absolute Monocytes: 573 cells/uL (ref 200–950)
Basophils Absolute: 124 cells/uL (ref 0–200)
Basophils Relative: 1.8 %
Eosinophils Absolute: 331 cells/uL (ref 15–500)
Eosinophils Relative: 4.8 %
HCT: 43.3 % (ref 38.5–50.0)
Hemoglobin: 15 g/dL (ref 13.2–17.1)
Lymphs Abs: 2360 cells/uL (ref 850–3900)
MCH: 30.3 pg (ref 27.0–33.0)
MCHC: 34.6 g/dL (ref 32.0–36.0)
MCV: 87.5 fL (ref 80.0–100.0)
MPV: 12.5 fL (ref 7.5–12.5)
Monocytes Relative: 8.3 %
Neutro Abs: 3512 cells/uL (ref 1500–7800)
Neutrophils Relative %: 50.9 %
Platelets: 157 10*3/uL (ref 140–400)
RBC: 4.95 10*6/uL (ref 4.20–5.80)
RDW: 13.3 % (ref 11.0–15.0)
Total Lymphocyte: 34.2 %
WBC: 6.9 10*3/uL (ref 3.8–10.8)

## 2021-09-06 LAB — SEDIMENTATION RATE: Sed Rate: 9 mm/h (ref 0–20)

## 2021-09-06 LAB — C-REACTIVE PROTEIN: CRP: 2.8 mg/L (ref <8.0)

## 2021-09-06 NOTE — Progress Notes (Signed)
Called patient to give lab results,verbalized understanding and has been scheduled for [place] 08/08@9 :30

## 2021-09-10 ENCOUNTER — Ambulatory Visit (INDEPENDENT_AMBULATORY_CARE_PROVIDER_SITE_OTHER): Payer: HMO

## 2021-09-10 DIAGNOSIS — L97512 Non-pressure chronic ulcer of other part of right foot with fat layer exposed: Secondary | ICD-10-CM | POA: Diagnosis not present

## 2021-09-11 ENCOUNTER — Ambulatory Visit: Payer: HMO | Admitting: Cardiology

## 2021-09-16 ENCOUNTER — Encounter: Payer: Self-pay | Admitting: Cardiology

## 2021-09-16 ENCOUNTER — Ambulatory Visit (INDEPENDENT_AMBULATORY_CARE_PROVIDER_SITE_OTHER): Payer: HMO | Admitting: Cardiology

## 2021-09-16 VITALS — BP 110/72 | HR 60 | Ht 77.0 in | Wt 260.0 lb

## 2021-09-16 DIAGNOSIS — E782 Mixed hyperlipidemia: Secondary | ICD-10-CM

## 2021-09-16 DIAGNOSIS — I208 Other forms of angina pectoris: Secondary | ICD-10-CM | POA: Diagnosis not present

## 2021-09-16 DIAGNOSIS — Z794 Long term (current) use of insulin: Secondary | ICD-10-CM | POA: Diagnosis not present

## 2021-09-16 DIAGNOSIS — Z6833 Body mass index (BMI) 33.0-33.9, adult: Secondary | ICD-10-CM | POA: Diagnosis not present

## 2021-09-16 DIAGNOSIS — Z87891 Personal history of nicotine dependence: Secondary | ICD-10-CM | POA: Diagnosis not present

## 2021-09-16 DIAGNOSIS — Z299 Encounter for prophylactic measures, unspecified: Secondary | ICD-10-CM | POA: Diagnosis not present

## 2021-09-16 DIAGNOSIS — E1165 Type 2 diabetes mellitus with hyperglycemia: Secondary | ICD-10-CM | POA: Diagnosis not present

## 2021-09-16 DIAGNOSIS — R079 Chest pain, unspecified: Secondary | ICD-10-CM

## 2021-09-16 MED ORDER — RANOLAZINE ER 1000 MG PO TB12: 1000.0000 mg | ORAL_TABLET | Freq: Two times a day (BID) | ORAL | 1 refills | Status: DC

## 2021-09-16 NOTE — Patient Instructions (Addendum)
Medication Instructions:  Your physician has recommended you make the following change in your medication:  Start taking ranolazine 1000 mg twice a day Continue all other medications as directed  Labwork: none  Testing/Procedures: none  Follow-Up:  Your physician recommends that you schedule a follow-up appointment in: 6 months  Any Other Special Instructions Will Be Listed Below (If Applicable).  If you need a refill on your cardiac medications before your next appointment, please call your pharmacy.

## 2021-09-16 NOTE — Progress Notes (Signed)
Clinical Summary [name] is a 61 y.o.male seen today for follow up of the following medical problems.    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 2005 and 2012 caths without epicardial disease - 04/2020 nuclear stress: no ischemia    - some chest pains at times. Pressure midchest, can be up to 10/10. Some assocaited SOB. Similar to his chronic chest pain.  - only taking ranexa once daily.     2. HTN -compliant with meds - home bp's 120/70s     3. Hyperlipidemia - labs followed by pcp - he is on atrovastatin.   05/2020 TC 167 TG 125 HDL 35 LDL 109   4. History of TIA - off ASA per renal according to patient - just messaged Dr Theador Hawthorne in clinic today, he is ok being of ASA.     5. DM2   6. Low heart rates - reports home nurse rate 53, at other provider 54 - denies any lightheadedness, dizziness, or syncope  03/2021 monitor rare ectopy, 3 runs SVT longest 8 beats. 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  Grandaughter turns 6 today, lives in [place].  Past Medical History:  Diagnosis Date   Allergic rhinitis, cause unspecified    Anginal pain ([place])    Arthritis    BPH (benign prostatic hyperplasia)    Cervicalgia    Chest pain Sept. 2005   multiple caths  /  cath 06/15/2010..normal coronaries,  EF 60%   (false postive nuclear in the past)   CKD (chronic kidney disease), stage III (HCC)    Complication of anesthesia    Hard time waking up patient stated low blood pressures around 2017   Dyslipidemia    mixed   Esophageal reflux    Fibromyalgia    Gastroparesis    Headache(784.0)    History of kidney stones    Hyperlipidemia    Hypertension    Hypoplasia of one kidney    IDDM (insulin dependent diabetes mellitus)    Lumbago    Neuropathy associated with endocrine disorder ([place])    Overweight(278.02)    Peripheral vascular disease ([place])    Pulmonary embolus (HCC)    Hx of small left lower lobe  pulmonary embolus    Pulmonary embolus ([place]) 2010ish   Renal insufficiency    Stroke [facility])    mini stroke aug 2016   Type II or unspecified type diabetes mellitus with neurological manifestations, not stated as uncontrolled(250.60)    Urticaria, unspecified    Wears glasses      Allergies  Allergen Reactions   Bee Venom Swelling   Reglan [Metoclopramide] Itching and Rash     Current Outpatient Medications  Medication Sig Dispense Refill   acetaminophen (TYLENOL) 325 MG tablet Take 2 tablets (650 mg total) by mouth every 6 (six) hours as needed for up to 30 doses for mild pain or moderate pain. 30 tablet 0   aspirin EC 81 MG tablet Take 1 tablet (81 mg total) by mouth daily. Swallow whole. (Patient not taking: Reported on 03/07/2021)     atorvastatin (LIPITOR) 40 MG tablet TAKE 1 TABLET BY MOUTH EVERY DAY 90 tablet 3   dapagliflozin propanediol (FARXIGA) 10 MG TABS tablet Take 10 mg by mouth daily.     doxycycline (VIBRA-TABS) 100 MG tablet Take 1 tablet (100 mg total) by mouth 2 (two) times daily. 20 tablet 0   doxycycline (VIBRA-TABS) 100  MG tablet Take 1 tablet (100 mg total) by mouth 2 (two) times daily. 20 tablet 0   EPINEPHrine 0.3 mg/0.3 mL IJ SOAJ injection Inject 0.3 mg into the muscle as needed for anaphylaxis.     EPINEPHrine 0.3 mg/0.3 mL IJ SOAJ injection Inject into the muscle.     gabapentin (NEURONTIN) 600 MG tablet 1 tab by mouth every morning & 3 tabs at bedtime     glipiZIDE (GLUCOTROL) 10 MG tablet Take 10 mg by mouth daily.     insulin detemir (LEVEMIR) 100 UNIT/ML injection Inject into the skin.     Lancets (ONETOUCH DELICA PLUS [id number]) MISC daily.     ONETOUCH ULTRA test strip USE TO TEST BLOOD SUGAR THREE TIMES DAILY     ranolazine (RANEXA) 1000 MG SR tablet TAKE 1 TABLET BY MOUTH EVERY DAY 30 tablet 11   silver sulfADIAZINE (SILVADENE) 1 % cream Apply pea-sized amount to wound daily. 50 g 0   sodium bicarbonate 650 MG tablet Take 650 mg by mouth 2 (two) times daily.      sodium  bicarbonate 650 MG tablet Take 1 tablet by mouth 3 (three) times daily. (Patient not taking: Reported on 03/07/2021)     No current facility-administered medications for this visit.     Past Surgical History:  Procedure Laterality Date   ACHILLES TENDON REPAIR Left 2013   AMPUTATION Left 06/02/2013   Procedure: AMPUTATION 1ST TOE LEFT FOOT;  Surgeon: Marcheta Grammes, DPM;  Location: [facility];  Service: Orthopedics;  Laterality: Left;   AMPUTATION Left 07/21/2013   Procedure: PARTIAL AMPUTATION 2ND TOE LEFT FOOT;  Surgeon: Marcheta Grammes, DPM;  Location: [facility];  Service: Podiatry;  Laterality: Left;   AMPUTATION Left 01/12/2019   Procedure: LEFT BELOW KNEE AMPUTATION;  Surgeon: Newt Minion, MD;  Location: [place];  Service: Orthopedics;  Laterality: Left;   ANTERIOR CERVICAL DECOMP/DISCECTOMY FUSION N/A 12/29/2016   Procedure: Cervical five-six Cervical six-seven Anterior cervical discectomy with fusion and plate fixation;  Surgeon: Ditty, Kevan Ny, MD;  Location: [facility];  Service: Neurosurgery;  Laterality: N/A;   APPENDECTOMY     BACK SURGERY  1999   x 6 some fusions   BELOW KNEE LEG AMPUTATION Left 01/2019   BIOPSY  12/18/2017   Procedure: BIOPSY;  Surgeon: Rogene Houston, MD;  [facility];  Service: Endoscopy;;  gastric and esophagus   colonscopy     ESOPHAGOGASTRODUODENOSCOPY (EGD) WITH PROPOFOL N/A 12/18/2017   Procedure: ESOPHAGOGASTRODUODENOSCOPY (EGD) WITH PROPOFOL;  Surgeon: Rogene Houston, MD;  [facility];  Service: Endoscopy;  Laterality: N/A;  9:25   FOOT ARTHRODESIS Right 01/11/2014   Procedure: ARTHRODESIS INTERPHALANGEAL JOINT HALLUX RIGHT FOOT;  Surgeon: Marcheta Grammes, DPM;  Location: [facility];  Service: Podiatry;  Laterality: Right;   HARDWARE REMOVAL Right 01/17/2015   Procedure: HARDWARE REMOVAL;  Surgeon: Caprice Beaver, DPM;  Location: [facility];  Service: Podiatry;  Laterality: Right;   I & D EXTREMITY Left  12/22/2018   Procedure: LEFT PARTIAL CALCANEAL EXCISION;  Surgeon: Newt Minion, MD;  Location: [place];  Service: Orthopedics;  Laterality: Left;   KNEE ARTHROSCOPY Right 10/2015   LUMBAR LAMINECTOMY/DECOMPRESSION MICRODISCECTOMY Right 01/12/2013   Procedure: Right Lumbar Three-Four Laminotomy/Foraminotomy;  Surgeon: Floyce Stakes, MD;  [facility];  Service: Neurosurgery;  Laterality: Right;  Right Lumbar Three-Four Laminotomy/Foraminotomy   NISSEN FUNDOPLICATION     SHOULDER ARTHROSCOPY W/ ROTATOR CUFF REPAIR Right  SHOULDER ARTHROSCOPY WITH DISTAL CLAVICLE RESECTION Right 10/04/2018   Procedure: RIGHT SHOULDER ARTHROSCOPY WITH DISTAL CLAVICLE RESECTION, EXTENSIVE DEBRIDEMENT, SUBACROMIAL PARTIAL ACROMIOPLASTY,;  Surgeon: Earlie Server, MD;  Location: [place];  Service: Orthopedics;  Laterality: Right;  PRE/POST OP SCALENE     Allergies  Allergen Reactions   Bee Venom Swelling   Reglan [Metoclopramide] Itching and Rash      Family History  Problem Relation Age of Onset   Heart attack Father 41   Hyperlipidemia Father    Hypertension Father    Heart attack Mother 16   Diabetes Mother    Hypertension Mother    Hyperlipidemia Mother    Healthy Brother    Seizures Brother    Migraines Neg Hx      Social History [name] reports that he quit smoking about 39 years ago. His smoking use included cigarettes. He started smoking about 44 years ago. He has a 15.00 pack-year smoking history. He has never used smokeless tobacco. [name] reports no history of alcohol use.   Review of Systems CONSTITUTIONAL: No weight loss, fever, chills, weakness or fatigue.  HEENT: Eyes: No visual loss, blurred vision, double vision or yellow sclerae.No hearing loss, sneezing, congestion, runny nose or sore throat.  SKIN: No rash or itching.  CARDIOVASCULAR: per hpi RESPIRATORY: No shortness of breath, cough or sputum.  GASTROINTESTINAL: No anorexia,  nausea, vomiting or diarrhea. No abdominal pain or blood.  GENITOURINARY: No burning on urination, no polyuria NEUROLOGICAL: No headache, dizziness, syncope, paralysis, ataxia, numbness or tingling in the extremities. No change in bowel or bladder control.  MUSCULOSKELETAL: No muscle, back pain, joint pain or stiffness.  LYMPHATICS: No enlarged nodes. No history of splenectomy.  PSYCHIATRIC: No history of depression or anxiety.  ENDOCRINOLOGIC: No reports of sweating, cold or heat intolerance. No polyuria or polydipsia.  [name]   Physical Examination Today's Vitals   09/16/21 0856  BP: 110/72  Pulse: 60  SpO2: 96%  Weight: 260 lb (117.9 kg)  Height: 6\' 5"  (1.956 m)   Body mass index is 30.83 kg/m.  Gen: resting comfortably, no acute distress HEENT: no scleral icterus, pupils equal round and reactive, no palptable cervical adenopathy,  CV: RRR, no m/r/g no jvd Resp: Clear to auscultation bilaterally GI: abdomen is soft, non-tender, non-distended, normal bowel sounds, no hepatosplenomegaly MSK: extremities are warm, no edema.  Skin: warm, no rash Neuro:  no focal deficits Psych: appropriate affect   Diagnostic Studies Echocardiogram: 03/2017 Study Conclusions   - Left ventricle: Inferobasal hypokinesis The cavity size was   normal. There was mild focal basal hypertrophy of the septum.   Systolic function was normal. The estimated ejection fraction was   in the range of 55% to 60%. Wall motion was normal; there were no   regional wall motion abnormalities. Doppler parameters are   consistent with abnormal left ventricular relaxation (grade 1   diastolic dysfunction). - Mitral valve: Valve area by pressure half-time: 1.55 cm^2. - Left atrium: The atrium was mildly dilated.   Cardiac Catheterization: 06/2010 ANGIOGRAPHIC DATA:  The right coronary artery arises somewhat anteriorly.  It is a small nondominant vessel and is normal.   The left main coronary artery is normal.    The left anterior descending artery is a large vessel and is normal.   There is a moderate ramus intermediate Claudio Mondry which is normal.   Left circumflex coronary artery is a dominant vessel.  There is minor narrowing of the third obtuse marginal  vessel up to 40%.  Otherwise, the left circumflex system appears normal.   Left ventricular angiography performed in RAO view demonstrates normal left ventricular size and contractility with ejection fraction estimated at 60%.   FINAL INTERPRETATION: 1. No significant obstructive atherosclerotic coronary artery disease. 2. Normal left ventricular function.   04/2020 nuclear stress There was no ST segment deviation noted during stress. The study is normal. Inferior defect most consistent with subdiaphragmatic attenuation. Cannot completely exclude prior mild inferior infarct. Either finding would support low risk. This is a low risk study. The left ventricular ejection fraction is normal (55-65%).    03/2021 monitor 2 day monitor Rare supraventricular ectopy in the form of isolated PACs, couplets, triplets. 3 runs of SVT longest 8 beats Rare ventricular ectopy in the form of isolated PVCs No symptoms reported  Assessment and Plan   1.Chest pain -long history of symptoms with benign caths and more recent nuclear stress test - has been treated for possible microvascular disease with ranexa. He is no longer on imdur.  - chronic symptoms ongoing, only taking ranexa once daily will correct to 1000mg  bid.      2. Hyperlipidemia - continue atorvastatin, request labs from pcp, Goal LDL would be at least <70 given prior TIAs        Arnoldo Lenis, M.D.

## 2021-09-17 ENCOUNTER — Ambulatory Visit: Payer: PPO | Admitting: Podiatry

## 2021-09-27 ENCOUNTER — Ambulatory Visit (INDEPENDENT_AMBULATORY_CARE_PROVIDER_SITE_OTHER): Payer: HMO | Admitting: Podiatry

## 2021-09-27 DIAGNOSIS — L97512 Non-pressure chronic ulcer of other part of right foot with fat layer exposed: Secondary | ICD-10-CM

## 2021-09-27 NOTE — Progress Notes (Unsigned)
Subjective: Chief Complaint  Patient presents with   Wound Check    Wound check, no pain, numbness or tingling    61 year old male with above concerns.  States the wound is doing at the same.  No swelling redness or drainage.  No fevers or chills.  No other concerns.  Objective: AAO x3, NAD DP/PT pulses palpable bilaterally, CRT less than 3 seconds On the plantar aspect of the right hallux is a granular wound with hyperkeratotic periwound.  Measures the same measuring 0.5 x 0.1 x 0.2 cm and was a skin fissure type lesion. Clinically appears to be improved from the last appointment.  There is no probing, no tunneling.  No fluctuance or crepitation but there is no malodor. There is a dermatitis that resolved. No pain with calf compression, swelling, warmth, erythema  Assessment: Ulceration right foot  Plan: -All treatment options discussed with the patient including all alternatives, risks, complications.  -Patient debrided the wound utilizing for 312 with scalpel any complications of bleeding, healthy, granular tissue.  There is no purulence.  I was able to debride nonviable devitalized tissue to promote wound healing.  Pre and post wound measurements were the same as noted above.  Minimal blood loss.  Hemostasis achieved with manual compression.  Prisma applied and discussed with him every other day dressing changes.  Continue daily dressing changes. -Given ongoing nature of the wound we can discuss surgical intervention however he states he is not able to do any type of surgery until he is done nothing months for the season.  Discussed that he needs to stay off the foot and limit activity.  I will refer him to the wound care center given ongoing nature of the wound. -Updated arterial studies within normal limits. -Patient encouraged to call the office with any questions, concerns, change in symptoms.   *Patient was upset today scheduled appointment time was apparently messed up which I was  not aware of until I entered the room today.  I apologize for the inconvenience.  Trula Slade DPM

## 2021-10-03 DIAGNOSIS — K219 Gastro-esophageal reflux disease without esophagitis: Secondary | ICD-10-CM | POA: Diagnosis not present

## 2021-10-03 DIAGNOSIS — E1122 Type 2 diabetes mellitus with diabetic chronic kidney disease: Secondary | ICD-10-CM | POA: Diagnosis not present

## 2021-10-15 ENCOUNTER — Telehealth: Payer: Self-pay | Admitting: *Deleted

## 2021-10-15 NOTE — Telephone Encounter (Signed)
Patient is calling for status of his wound care referral.  [facility], and he is scheduled for Oct. 5th @ 8:45,arrival time is : 8:30  Patient has been notified, giving their phone number if he needs to reschedule.

## 2021-10-21 DIAGNOSIS — N186 End stage renal disease: Secondary | ICD-10-CM | POA: Diagnosis not present

## 2021-10-21 DIAGNOSIS — E1122 Type 2 diabetes mellitus with diabetic chronic kidney disease: Secondary | ICD-10-CM | POA: Diagnosis not present

## 2021-10-23 DIAGNOSIS — E1165 Type 2 diabetes mellitus with hyperglycemia: Secondary | ICD-10-CM | POA: Diagnosis not present

## 2021-10-23 DIAGNOSIS — Z299 Encounter for prophylactic measures, unspecified: Secondary | ICD-10-CM | POA: Diagnosis not present

## 2021-10-23 DIAGNOSIS — I739 Peripheral vascular disease, unspecified: Secondary | ICD-10-CM | POA: Diagnosis not present

## 2021-10-23 DIAGNOSIS — L03039 Cellulitis of unspecified toe: Secondary | ICD-10-CM | POA: Diagnosis not present

## 2021-10-23 DIAGNOSIS — F17210 Nicotine dependence, cigarettes, uncomplicated: Secondary | ICD-10-CM | POA: Diagnosis not present

## 2021-10-23 DIAGNOSIS — Z6832 Body mass index (BMI) 32.0-32.9, adult: Secondary | ICD-10-CM | POA: Diagnosis not present

## 2021-10-28 DIAGNOSIS — E119 Type 2 diabetes mellitus without complications: Secondary | ICD-10-CM | POA: Diagnosis not present

## 2021-10-30 DIAGNOSIS — E211 Secondary hyperparathyroidism, not elsewhere classified: Secondary | ICD-10-CM | POA: Diagnosis not present

## 2021-10-30 DIAGNOSIS — E8722 Chronic metabolic acidosis: Secondary | ICD-10-CM | POA: Diagnosis not present

## 2021-10-30 DIAGNOSIS — N189 Chronic kidney disease, unspecified: Secondary | ICD-10-CM | POA: Diagnosis not present

## 2021-10-30 DIAGNOSIS — I129 Hypertensive chronic kidney disease with stage 1 through stage 4 chronic kidney disease, or unspecified chronic kidney disease: Secondary | ICD-10-CM | POA: Diagnosis not present

## 2021-10-30 DIAGNOSIS — I5032 Chronic diastolic (congestive) heart failure: Secondary | ICD-10-CM | POA: Diagnosis not present

## 2021-10-30 DIAGNOSIS — E6609 Other obesity due to excess calories: Secondary | ICD-10-CM | POA: Diagnosis not present

## 2021-10-30 DIAGNOSIS — E1122 Type 2 diabetes mellitus with diabetic chronic kidney disease: Secondary | ICD-10-CM | POA: Diagnosis not present

## 2021-11-07 ENCOUNTER — Encounter: Payer: HMO | Attending: Physician Assistant | Admitting: Physician Assistant

## 2021-11-07 DIAGNOSIS — L97512 Non-pressure chronic ulcer of other part of right foot with fat layer exposed: Secondary | ICD-10-CM | POA: Diagnosis not present

## 2021-11-07 DIAGNOSIS — E11621 Type 2 diabetes mellitus with foot ulcer: Secondary | ICD-10-CM | POA: Diagnosis not present

## 2021-11-07 DIAGNOSIS — I2510 Atherosclerotic heart disease of native coronary artery without angina pectoris: Secondary | ICD-10-CM | POA: Insufficient documentation

## 2021-11-07 DIAGNOSIS — I129 Hypertensive chronic kidney disease with stage 1 through stage 4 chronic kidney disease, or unspecified chronic kidney disease: Secondary | ICD-10-CM | POA: Insufficient documentation

## 2021-11-07 DIAGNOSIS — E1122 Type 2 diabetes mellitus with diabetic chronic kidney disease: Secondary | ICD-10-CM | POA: Diagnosis not present

## 2021-11-07 DIAGNOSIS — N183 Chronic kidney disease, stage 3 unspecified: Secondary | ICD-10-CM | POA: Diagnosis not present

## 2021-11-08 NOTE — Progress Notes (Signed)
[name] ([id number]) Visit Report for 11/07/2021 Chief Complaint Document Details Patient Name: [name], [name] Date of Service: 11/07/2021 8:45 AM Medical Record Number: [id number] Patient Account Number: [id number] Date of Birth/Sex: 1961/01/16 (61 y.o. M) Treating RN: Carlene Coria Primary Care Provider: Bernardo Heater Other Clinician: Referring Provider: Celesta Gentile Treating Provider/Extender: Skipper Cliche in Treatment: 0 Information Obtained from: Patient Chief Complaint Right great toe ulcer Electronic Signature(s) Signed: 11/07/2021 9:20:45 AM By: Worthy Keeler PA-C Entered By: Worthy Keeler on 11/07/2021 09:20:45 [place], Reynolds Bowl ([id number]) -------------------------------------------------------------------------------- Debridement Details Patient Name: [name] Date of Service: 11/07/2021 8:45 AM Medical Record Number: [id number] Patient Account Number: [id number] Date of Birth/Sex: 1960-09-29 (61 y.o. M) Treating RN: Carlene Coria Primary Care Provider: Bernardo Heater Other Clinician: Referring Provider: Celesta Gentile Treating Provider/Extender: Jeri Cos Weeks in Treatment: 0 Debridement Performed for Wound #1 Right,Plantar Toe Great Assessment: Performed By: Physician Tommie Sams., PA-C Debridement Type: Debridement Severity of Tissue Pre Debridement: Fat layer exposed Level of Consciousness (Pre- Awake and Alert procedure): Pre-procedure Verification/Time Out Yes - 09:30 Taken: Start Time: 09:30 Total Area Debrided (L x W): 1.5 (cm) x 1.5 (cm) = 2.25 (cm) Tissue and other material Viable, Non-Viable, Callus, Slough, Subcutaneous, Skin: Dermis , Skin: Epidermis, Slough debrided: Level: Skin/Subcutaneous Tissue Debridement Description: Excisional Instrument: Curette Bleeding: Moderate Hemostasis Achieved: Pressure End Time: 09:34 Procedural Pain: 0 Post Procedural Pain: 0 Response to Treatment: Procedure was  tolerated well Level of Consciousness (Post- Awake and Alert procedure): Post Debridement Measurements of Total Wound Length: (cm) 1.1 Width: (cm) 0.4 Depth: (cm) 0.3 Volume: (cm) 0.104 Character of Wound/Ulcer Post Debridement: Improved Severity of Tissue Post Debridement: Fat layer exposed Post Procedure Diagnosis Same as Pre-procedure Electronic Signature(s) Signed: 11/07/2021 2:50:44 PM By: Worthy Keeler PA-C Signed: 11/08/2021 12:45:55 PM By: Carlene Coria RN Entered By: Carlene Coria on 11/07/2021 09:45:55 [place], Jesten Carlean Jews ([id number]) -------------------------------------------------------------------------------- HPI Details Patient Name: [name] Date of Service: 11/07/2021 8:45 AM Medical Record Number: [id number] Patient Account Number: [id number] Date of Birth/Sex: 09-26-1960 (61 y.o. M) Treating RN: Carlene Coria Primary Care Provider: Bernardo Heater Other Clinician: Referring Provider: Celesta Gentile Treating Provider/Extender: Skipper Cliche in Treatment: 0 History of Present Illness HPI Description: 11-07-2021 upon evaluation today patient appears to be doing somewhat poorly in regard to a wound on his right plantar great toe. Fortunately there does not appear to be any evidence of active infection locally or systemically at this time which is great news. No fevers, chills, nausea, vomiting, or diarrhea. With that being said he tells me that he really has not been noticing a lot of improvement either with regard to the treatment. He has been under the care of Dr. Earleen Newport up to this point for roughly a year. More recently he did have an MRI which revealed the potential for possible osteomyelitis. With that being said I do believe that he definitely should probably be monitored for this but to be honest the Elesa Hacker that he was given probably did a good job. Considering there was not much being seen on MRI I do not think there is a high likelihood of severe  osteomyelitis at this point we will definitely keep an eye on things however. Nonetheless right now has been utilizing Silvadene cream I definitely think we can find something better to help get this moving in the right direction. Patient does have a history of diabetes mellitus type 2, chronic kidney disease stage III, hypertension, and again  he has had an amputation of the left leg currently. The right leg is the 1 that we are actually treating the great toe wound on. He has a below-knee amputation. Electronic Signature(s) Signed: 11/07/2021 2:15:33 PM By: Worthy Keeler PA-C Entered By: Worthy Keeler on 11/07/2021 14:15:33 [place], Reynolds Bowl ([id number]) -------------------------------------------------------------------------------- Physical Exam Details Patient Name: [name] Date of Service: 11/07/2021 8:45 AM Medical Record Number: [id number] Patient Account Number: [id number] Date of Birth/Sex: 07-31-1960 (61 y.o. M) Treating RN: Carlene Coria Primary Care Provider: Bernardo Heater Other Clinician: Referring Provider: Celesta Gentile Treating Provider/Extender: Skipper Cliche in Treatment: 0 Constitutional patient is hypertensive.. pulse regular and within target range for patient.[name] respirations regular, non-labored and within target range for patient.[name] temperature within target range for patient.. Well-nourished and well-hydrated in no acute distress. Eyes conjunctiva clear no eyelid edema noted. pupils equal round and reactive to light and accommodation. Ears, Nose, Mouth, and Throat no gross abnormality of ear auricles or external auditory canals. normal hearing noted during conversation. mucus membranes moist. Respiratory normal breathing without difficulty. Cardiovascular 2+ dorsalis pedis/posterior tibialis pulses. no clubbing, cyanosis, significant edema, <3 sec cap refill. Musculoskeletal Patient has a left LE prosthesis. no significant deformity or arthritic  changes, no loss or range of motion, no clubbing. Psychiatric this patient is able to make decisions and demonstrates good insight into disease process. Alert and Oriented x 3. pleasant and cooperative. Notes Upon inspection patient's wound actually did have some necrotic tissue noted at this point and I did actually perform some debridement to clearway the necrotic tissue today. He tolerated that without complication and postdebridement the wound bed actually appears to be doing much better it did not extend to bone and overall I think that we are on a right track here as far as healing is concerned. Electronic Signature(s) Signed: 11/07/2021 2:18:56 PM By: Worthy Keeler PA-C Entered By: Worthy Keeler on 11/07/2021 14:18:55 [place], Reynolds Bowl ([id number]) -------------------------------------------------------------------------------- Physician Orders Details Patient Name: [name] Date of Service: 11/07/2021 8:45 AM Medical Record Number: [id number] Patient Account Number: [id number] Date of Birth/Sex: [birth year] (61 y.o. M) Treating RN: Carlene Coria Primary Care Provider: Bernardo Heater Other Clinician: Referring Provider: Celesta Gentile Treating Provider/Extender: Skipper Cliche in Treatment: 0 Verbal / Phone Orders: No Diagnosis Coding ICD-10 Coding Code Description E11.621 Type 2 diabetes mellitus with foot ulcer L97.512 Non-pressure chronic ulcer of other part of right foot with fat layer exposed N18.30 Chronic kidney disease, stage 3 unspecified I10 Essential (primary) hypertension Follow-up Appointments o Return Appointment in 1 week. Bathing/ Shower/ Hygiene o May shower; gently cleanse wound with antibacterial soap, rinse and pat dry prior to dressing wounds o No tub bath. Anesthetic (Use 'Patient Medications' Section for Anesthetic Order Entry) o Lidocaine applied to wound bed Edema Control - Lymphedema / Segmental Compressive Device /  Other o Elevate, Exercise Daily and Avoid Standing for Long Periods of Time. o Elevate legs to the level of the heart and pump ankles as often as possible o Elevate leg(s) parallel to the floor when sitting. Wound Treatment Wound #1 - Toe Great Wound Laterality: Plantar, Right Cleanser: Soap and Water 3 x Per Week Discharge Instructions: Gently cleanse wound with antibacterial soap, rinse and pat dry prior to dressing wounds Cleanser: Wound Cleanser 3 x Per Week Discharge Instructions: Wash your hands with soap and water. Remove old dressing, discard into plastic bag and place into trash. Cleanse the wound with Wound Cleanser prior to applying  a clean dressing using gauze sponges, not tissues or cotton balls. Do not scrub or use excessive force. Pat dry using gauze sponges, not tissue or cotton balls. Primary Dressing: Prisma 4.34 (in) 3 x Per Week Discharge Instructions: Moisten w/normal saline or sterile water; Cover wound as directed. Do not remove from wound bed. Secondary Dressing: Gauze 3 x Per Week Secured With: Medipore Tape - 43M Medipore H Soft Cloth Surgical Tape, 2x2 (in/yd) 3 x Per Week Electronic Signature(s) Signed: 11/07/2021 2:50:44 PM By: Worthy Keeler PA-C Signed: 11/08/2021 12:45:55 PM By: Carlene Coria RN Entered By: Carlene Coria on 11/07/2021 09:45:32 [place], Reynolds Bowl ([id number]) -------------------------------------------------------------------------------- Problem List Details Patient Name: [name] Date of Service: 11/07/2021 8:45 AM Medical Record Number: [id number] Patient Account Number: [id number] Date of Birth/Sex: [birth year] (61 y.o. M) Treating RN: Carlene Coria Primary Care Provider: Bernardo Heater Other Clinician: Referring Provider: Celesta Gentile Treating Provider/Extender: Jeri Cos Weeks in Treatment: 0 Active Problems ICD-10 Encounter Code Description Active Date MDM Diagnosis E11.621 Type 2 diabetes mellitus with foot  ulcer 11/07/2021 No Yes L97.512 Non-pressure chronic ulcer of other part of right foot with fat layer 11/07/2021 No Yes exposed N18.30 Chronic kidney disease, stage 3 unspecified 11/07/2021 No Yes I10 Essential (primary) hypertension 11/07/2021 No Yes Inactive Problems Resolved Problems Electronic Signature(s) Signed: 11/07/2021 9:19:09 AM By: Worthy Keeler PA-C Entered By: Worthy Keeler on 11/07/2021 09:19:09 [place], Reynolds Bowl ([id number]) -------------------------------------------------------------------------------- Progress Note Details Patient Name: [name] Date of Service: 11/07/2021 8:45 AM Medical Record Number: [id number] Patient Account Number: [id number] Date of Birth/Sex: 1960-12-04 (61 y.o. M) Treating RN: Carlene Coria Primary Care Provider: Bernardo Heater Other Clinician: Referring Provider: Celesta Gentile Treating Provider/Extender: Skipper Cliche in Treatment: 0 Subjective Chief Complaint Information obtained from Patient Right great toe ulcer History of Present Illness (HPI) 11-07-2021 upon evaluation today patient appears to be doing somewhat poorly in regard to a wound on his right plantar great toe. Fortunately there does not appear to be any evidence of active infection locally or systemically at this time which is great news. No fevers, chills, nausea, vomiting, or diarrhea. With that being said he tells me that he really has not been noticing a lot of improvement either with regard to the treatment. He has been under the care of Dr. Earleen Newport up to this point for roughly a year. More recently he did have an MRI which revealed the potential for possible osteomyelitis. With that being said I do believe that he definitely should probably be monitored for this but to be honest the Elesa Hacker that he was given probably did a good job. Considering there was not much being seen on MRI I do not think there is a high likelihood of severe osteomyelitis at this  point we will definitely keep an eye on things however. Nonetheless right now has been utilizing Silvadene cream I definitely think we can find something better to help get this moving in the right direction. Patient does have a history of diabetes mellitus type 2, chronic kidney disease stage III, hypertension, and again he has had an amputation of the left leg currently. The right leg is the 1 that we are actually treating the great toe wound on. He has a below-knee amputation. Patient History Information obtained from Patient. Allergies bee venom protein (honey bee), Reglan Social History Never smoker, Marital Status - Married, Alcohol Use - Never, Drug Use - No History, Caffeine Use - Rarely. Medical History Cardiovascular Patient has history  of Coronary Artery Disease, Hypertension, Peripheral Venous Disease Endocrine Patient has history of Type II Diabetes Patient is treated with Insulin, Oral Agents. Blood sugar results noted at the following times: Breakfast - 125. Review of Systems (ROS) Eyes Complains or has symptoms of Glasses / Contacts. Genitourinary Denies complaints or symptoms of Kidney failure/ Dialysis, Incontinence/dribbling. Integumentary (Skin) Complains or has symptoms of Wounds, Swelling. Objective Constitutional patient is hypertensive.. pulse regular and within target range for patient.[name] respirations regular, non-labored and within target range for patient.[name] temperature within target range for patient.. Well-nourished and well-hydrated in no acute distress. Vitals Time Taken: 8:50 AM, Height: 77 in, Source: Stated, Weight: 262 lbs, Source: Stated, BMI: 31.1, Temperature: 97.7 F, Pulse: 52 bpm, Respiratory Rate: 18 breaths/min, Blood Pressure: 173/89 mmHg. General Notes: patient reports being anxious about being here, does not take blood pressure medcaitions, denies lightheadedness, dizziness, blurred vision, ring of ears. alert and oreinted times 3, notified  H. Stone PA Grussing, Antwan L. ([id number]) Eyes conjunctiva clear no eyelid edema noted. pupils equal round and reactive to light and accommodation. Ears, Nose, Mouth, and Throat no gross abnormality of ear auricles or external auditory canals. normal hearing noted during conversation. mucus membranes moist. Respiratory normal breathing without difficulty. Cardiovascular 2+ dorsalis pedis/posterior tibialis pulses. no clubbing, cyanosis, significant edema, Musculoskeletal Patient has a left LE prosthesis. no significant deformity or arthritic changes, no loss or range of motion, no clubbing. Psychiatric this patient is able to make decisions and demonstrates good insight into disease process. Alert and Oriented x 3. pleasant and cooperative. General Notes: Upon inspection patient's wound actually did have some necrotic tissue noted at this point and I did actually perform some debridement to clearway the necrotic tissue today. He tolerated that without complication and postdebridement the wound bed actually appears to be doing much better it did not extend to bone and overall I think that we are on a right track here as far as healing is concerned. Integumentary (Hair, Skin) Wound #1 status is Open. Original cause of wound was Gradually Appeared. The date acquired was: 02/15/2020. The wound is located on the [employer]. The wound measures 1.1cm length x 0.4cm width x 0.3cm depth; 0.346cm^2 area and 0.104cm^3 volume. There is Fat Layer (Subcutaneous Tissue) exposed. There is no tunneling or undermining noted. There is a medium amount of serosanguineous drainage noted. There is small (1-33%) pink granulation within the wound bed. There is a large (67-100%) amount of necrotic tissue within the wound bed. Assessment Active Problems ICD-10 Type 2 diabetes mellitus with foot ulcer Non-pressure chronic ulcer of other part of right foot with fat layer exposed Chronic kidney disease,  stage 3 unspecified Essential (primary) hypertension Procedures Wound #1 Pre-procedure diagnosis of Wound #1 is a Diabetic Wound/Ulcer of the Lower Extremity located on the Right,Plantar Toe Great .Severity of Tissue Pre Debridement is: Fat layer exposed. There was a Excisional Skin/Subcutaneous Tissue Debridement with a total area of 2.25 sq cm performed by Tommie Sams., PA-C. With the following instrument(s): Curette to remove Viable and Non-Viable tissue/material. Material removed includes Callus, Subcutaneous Tissue, Slough, Skin: Dermis, and Skin: Epidermis. No specimens were taken. A time out was conducted at 09:30, prior to the start of the procedure. A Moderate amount of bleeding was controlled with Pressure. The procedure was tolerated well with a pain level of 0 throughout and a pain level of 0 following the procedure. Post Debridement Measurements: 1.1cm length x 0.4cm width x 0.3cm depth; 0.104cm^3 volume. Character  of Wound/Ulcer Post Debridement is improved. Severity of Tissue Post Debridement is: Fat layer exposed. Post procedure Diagnosis Wound #1: Same as Pre-Procedure Plan Follow-up Appointments: Return Appointment in 1 week. Bathing/ Shower/ Hygiene: May shower; gently cleanse wound with antibacterial soap, rinse and pat dry prior to dressing wounds No tub bath. Anesthetic (Use 'Patient Medications' Section for Anesthetic Order Entry): Lidocaine applied to wound bed Kipp, Nisaiah L. ([id number]) Edema Control - Lymphedema / Segmental Compressive Device / Other: Elevate, Exercise Daily and Avoid Standing for Long Periods of Time. Elevate legs to the level of the heart and pump ankles as often as possible Elevate leg(s) parallel to the floor when sitting. WOUND #1: - Toe Great Wound Laterality: Plantar, Right Cleanser: Soap and Water 3 x Per Week/ Discharge Instructions: Gently cleanse wound with antibacterial soap, rinse and pat dry prior to dressing  wounds Cleanser: Wound Cleanser 3 x Per Week/ Discharge Instructions: Wash your hands with soap and water. Remove old dressing, discard into plastic bag and place into trash. Cleanse the wound with Wound Cleanser prior to applying a clean dressing using gauze sponges, not tissues or cotton balls. Do not scrub or use excessive force. Pat dry using gauze sponges, not tissue or cotton balls. Primary Dressing: Prisma 4.34 (in) 3 x Per Week/ Discharge Instructions: Moisten w/normal saline or sterile water; Cover wound as directed. Do not remove from wound bed. Secondary Dressing: Gauze 3 x Per Week/ Secured With: Medipore Tape - 3M Medipore H Soft Cloth Surgical Tape, 2x2 (in/yd) 3 x Per Week/ 1. I am good recommend based on what I am seeing currently that we go ahead and have the patient continue to monitor for any signs of worsening or infection. Obviously I do believe based on what I am seeing that he seems to be doing quite well in that respect but we will keep a close eye on things. 2. I am also going to recommend that we have the patient continue with the silver collagen dressing as initiation of treatment today as I feel like this is probably going to do the best for him. He is in agreement with that plan. 3. I would also recommend that we have him utilize gauze to cover followed by roll gauze to secure in place using tape. Again this would give a little bit of a padding. 4. Unfortunately the biggest issue is going to be offloading he does cut grass for a living and therefore is Georgina Peer be up on this including having to weed whack he has no choice. With that being said I think that that time is nearing completion and hopefully we can keep things moving at least to some degree in a good direction until he gets to the point where that is over with that he does not have to do any more yards and then hopefully if nothing else by that point we can get him completely closed. He is in agreement with this  plan. We will see patient back for reevaluation in 1 week here in the clinic. If anything worsens or changes patient will contact our office for additional recommendations. Electronic Signature(s) Signed: 11/07/2021 2:20:14 PM By: Worthy Keeler PA-C Entered By: Worthy Keeler on 11/07/2021 14:20:13 [place], Reynolds Bowl ([id number]) -------------------------------------------------------------------------------- ROS/PFSH Details Patient Name: [name] Date of Service: 11/07/2021 8:45 AM Medical Record Number: [id number] Patient Account Number: [id number] Date of Birth/Sex: 1960/03/02 (61 y.o. M) Treating RN: Carlene Coria Primary Care Provider: Bernardo Heater Other Clinician: Referring Provider:  Celesta Gentile Treating Provider/Extender: Jeri Cos Weeks in Treatment: 0 Information Obtained From Patient Eyes Complaints and Symptoms: Positive for: Glasses / Contacts Genitourinary Complaints and Symptoms: Negative for: Kidney failure/ Dialysis; Incontinence/dribbling Integumentary (Skin) Complaints and Symptoms: Positive for: Wounds; Swelling Cardiovascular Medical History: Positive for: Coronary Artery Disease; Hypertension; Peripheral Venous Disease Endocrine Medical History: Positive for: Type II Diabetes Time with diabetes: 20 years Treated with: Insulin, Oral agents Blood sugar testing results: Breakfast: 125 Immunizations Pneumococcal Vaccine: Received Pneumococcal Vaccination: No Implantable Devices None Family and Social History Never smoker; Marital Status - Married; Alcohol Use: Never; Drug Use: No History; Caffeine Use: Rarely Electronic Signature(s) Signed: 11/07/2021 2:50:44 PM By: Worthy Keeler PA-C Signed: 11/08/2021 12:45:55 PM By: Carlene Coria RN Entered By: Carlene Coria on 11/07/2021 08:56:51 [place], Reynolds Bowl ([id number]) -------------------------------------------------------------------------------- SuperBill Details Patient Name:  [name] Date of Service: 11/07/2021 Medical Record Number: [id number] Patient Account Number: [id number] Date of Birth/Sex: [birth year] (61 y.o. M) Treating RN: Carlene Coria Primary Care Provider: Bernardo Heater Other Clinician: Referring Provider: Celesta Gentile Treating Provider/Extender: Skipper Cliche in Treatment: 0 Diagnosis Coding ICD-10 Codes Code Description [phone] Type 2 diabetes mellitus with foot ulcer L97.512 Non-pressure chronic ulcer of other part of right foot with fat layer exposed N18.30 Chronic kidney disease, stage 3 unspecified I10 Essential (primary) hypertension Facility Procedures CPT4 Code: [id number] Description: [place] VISIT-LEV 3 EST PT Modifier: Quantity: 1 CPT4 Code: [id number] Description: 11042 - DEB SUBQ TISSUE 20 SQ CM/< Modifier: Quantity: 1 CPT4 Code: Description: ICD-10 Diagnosis Description L97.512 Non-pressure chronic ulcer of other part of right foot with fat layer ex Modifier: posed Quantity: Physician Procedures CPT4 Code: [id number] Description: WC PHYS LEVEL 3 o NEW PT Modifier: 25 Quantity: 1 CPT4 Code: Description: ICD-10 Diagnosis Description E11.621 Type 2 diabetes mellitus with foot ulcer L97.512 Non-pressure chronic ulcer of other part of right foot with fat layer ex N18.30 Chronic kidney disease, stage 3 unspecified I10 Essential (primary)  hypertension Modifier: posed Quantity: CPT4 Code: [id number] Description: 11042 - WC PHYS SUBQ TISS 20 SQ CM Modifier: Quantity: 1 CPT4 Code: Description: ICD-10 Diagnosis Description L97.512 Non-pressure chronic ulcer of other part of right foot with fat layer ex Modifier: posed Quantity: Electronic Signature(s) Signed: 11/07/2021 2:20:31 PM By: Worthy Keeler PA-C Previous Signature: 11/07/2021 9:51:18 AM Version By: Carlene Coria RN Entered By: Worthy Keeler on 11/07/2021 14:20:30

## 2021-11-08 NOTE — Progress Notes (Signed)
DODGER, SINNING ([id number]) Visit Report for 11/07/2021 Abuse Risk Screen Details Patient Name: [name], [name] Date of Service: 11/07/2021 8:45 AM Medical Record Number: [id number] Patient Account Number: [id number] Date of Birth/Sex: 06/08/1960 (61 y.o. M) Treating RN: Carlene Coria Primary Care Cherissa Hook: Bernardo Heater Other Clinician: Referring Zaylie Gisler: Celesta Gentile Treating Jaydien Panepinto/Extender: Skipper Cliche in Treatment: 0 Abuse Risk Screen Items Answer ABUSE RISK SCREEN: Has anyone close to you tried to hurt or harm you recentlyo No Do you feel uncomfortable with anyone in your familyo No Has anyone forced you do things that you didnot want to doo No Electronic Signature(s) Signed: 11/08/2021 12:45:55 PM By: Carlene Coria RN Entered By: Carlene Coria on 11/07/2021 08:56:57 [place], [place]. ([id number]) -------------------------------------------------------------------------------- Activities of Daily Living Details Patient Name: [name] Date of Service: 11/07/2021 8:45 AM Medical Record Number: [id number] Patient Account Number: [id number] Date of Birth/Sex: 02-10-1960 (61 y.o. M) Treating RN: Carlene Coria Primary Care Mally Gavina: Bernardo Heater Other Clinician: Referring Annemarie Sebree: Celesta Gentile Treating Lealon Vanputten/Extender: Skipper Cliche in Treatment: 0 Activities of Daily Living Items Answer Activities of Daily Living (Please select one for each item) Drive Automobile Completely Able Take Medications Completely Able Use Telephone Completely Able Care for Appearance Completely Able Use Toilet Completely Able Bath / Shower Completely Able Dress Self Completely Able Feed Self Completely Able Walk Completely Able Get In / Out Bed Completely Able Housework Completely Able Prepare Meals Completely Belva for Self Completely Able Electronic Signature(s) Signed: 11/08/2021 12:45:55 PM By: Carlene Coria RN Entered  By: Carlene Coria on 11/07/2021 08:57:19 [place], [place] ([id number]) -------------------------------------------------------------------------------- Education Screening Details Patient Name: [name] Date of Service: 11/07/2021 8:45 AM Medical Record Number: [id number] Patient Account Number: [id number] Date of Birth/Sex: 27-Jul-1960 (61 y.o. M) Treating RN: Carlene Coria Primary Care Bill Mcvey: Bernardo Heater Other Clinician: Referring Cray Monnin: Celesta Gentile Treating Jakaylah Schlafer/Extender: Skipper Cliche in Treatment: 0 Primary Learner Assessed: Patient Learning Preferences/Education Level/Primary Language Learning Preference: Explanation Highest Education Level: High School Preferred Language: English Cognitive Barrier Language Barrier: No Translator Needed: No Memory Deficit: No Emotional Barrier: No Cultural/Religious Beliefs Affecting Medical Care: No Physical Barrier Impaired Vision: Yes Glasses Impaired Hearing: No Decreased Hand dexterity: No Knowledge/Comprehension Knowledge Level: Medium Comprehension Level: Medium Ability to understand written instructions: Medium Ability to understand verbal instructions: Medium Motivation Anxiety Level: Anxious Cooperation: Cooperative Education Importance: Acknowledges Need Interest in Health Problems: Asks Questions Perception: Coherent Willingness to Engage in Self-Management High Activities: Readiness to Engage in Self-Management High Activities: Electronic Signature(s) Signed: 11/08/2021 12:45:55 PM By: Carlene Coria RN Entered By: Carlene Coria on 11/07/2021 08:57:50 [place], [place] ([id number]) -------------------------------------------------------------------------------- Fall Risk Assessment Details Patient Name: [name] Date of Service: 11/07/2021 8:45 AM Medical Record Number: [id number] Patient Account Number: [id number] Date of Birth/Sex: [birth year] (61 y.o. M) Treating RN: Carlene Coria Primary Care Delphine Sizemore: Bernardo Heater Other Clinician: Referring Adrien Dietzman: Celesta Gentile Treating Ada Woodbury/Extender: Skipper Cliche in Treatment: 0 Fall Risk Assessment Items Have you had 2 or more falls in the last 12 monthso 0 No Have you had any fall that resulted in injury in the last 12 monthso 0 No FALLS RISK SCREEN History of falling - immediate or within 3 months 0 No Secondary diagnosis (Do you have 2 or more medical diagnoseso) 0 No Ambulatory aid None/bed rest/wheelchair/nurse 0 No Crutches/cane/walker 0 No Furniture 0 No Intravenous therapy Access/Saline/Heparin Lock 0 No Gait/Transferring Normal/ bed rest/ wheelchair 0 No Weak (short steps with  or without shuffle, stooped but able to lift head while walking, may 0 No seek support from furniture) Impaired (short steps with shuffle, may have difficulty arising from chair, head down, impaired 0 No balance) Mental Status Oriented to own ability 0 Yes Electronic Signature(s) Signed: 11/08/2021 12:45:55 PM By: Carlene Coria RN Entered By: Carlene Coria on 11/07/2021 08:58:08 [place], [place] ([id number]) -------------------------------------------------------------------------------- Foot Assessment Details Patient Name: [name] Date of Service: 11/07/2021 8:45 AM Medical Record Number: [id number] Patient Account Number: [id number] Date of Birth/Sex: [birth year] (61 y.o. M) Treating RN: Carlene Coria Primary Care Braniyah Besse: Bernardo Heater Other Clinician: Referring Mishti Swanton: Celesta Gentile Treating Emiliano Welshans/Extender: Skipper Cliche in Treatment: 0 Foot Assessment Items Site Locations + = Sensation present, - = Sensation absent, C = Callus, U = Ulcer R = Redness, W = Warmth, M = Maceration, PU = Pre-ulcerative lesion F = Fissure, S = Swelling, D = Dryness Assessment Right: Left: Other Deformity: No No Prior Foot Ulcer: No No Prior Amputation: No No Charcot Joint: No No Ambulatory  Status: Ambulatory Without Help Gait: Steady Electronic Signature(s) Signed: 11/08/2021 12:45:55 PM By: Carlene Coria RN Entered By: Carlene Coria on 11/07/2021 [place], [place] ([id number]) -------------------------------------------------------------------------------- Nutrition Risk Screening Details Patient Name: [name] Date of Service: 11/07/2021 8:45 AM Medical Record Number: [id number] Patient Account Number: [id number] Date of Birth/Sex: February 15, 1960 (61 y.o. M) Treating RN: Carlene Coria Primary Care Findley Vi: Bernardo Heater Other Clinician: Referring Keyondre Hepburn: Celesta Gentile Treating Shelonda Saxe/Extender: Skipper Cliche in Treatment: 0 Height (in): 77 Weight (lbs): 262 Body Mass Index (BMI): 31.1 Nutrition Risk Screening Items Score Screening NUTRITION RISK SCREEN: I have an illness or condition that made me change the kind and/or amount of food I eat 0 No I eat fewer than two meals per day 0 No I eat few fruits and vegetables, or milk products 0 No I have three or more drinks of beer, liquor or wine almost every day 0 No I have tooth or mouth problems that make it hard for me to eat 0 No I don't always have enough money to buy the food I need 0 No I eat alone most of the time 0 No I take three or more different prescribed or over-the-counter drugs a day 1 Yes Without wanting to, I have lost or gained 10 pounds in the last six months 0 No I am not always physically able to shop, cook and/or feed myself 0 No Nutrition Protocols Good Risk Protocol 0 No interventions needed Moderate Risk Protocol High Risk Proctocol Risk Level: Good Risk Score: 1 Electronic Signature(s) Signed: 11/08/2021 12:45:55 PM By: Carlene Coria RN Entered By: Carlene Coria on 11/07/2021 08:58:23

## 2021-11-08 NOTE — Progress Notes (Signed)
[name] ([id number]) Visit Report for 11/07/2021 Allergy List Details Patient Name: [name], [name] Date of Service: 11/07/2021 8:45 AM Medical Record Number: [id number] Patient Account Number: [id number] Date of Birth/Sex: Jun 12, 1960 (61 y.o. M) Treating RN: [name] Primary Care [name]: [name] Other Clinician: Referring [name]: [name] Treating [name]/Extender: [name] in Treatment: 0 Allergies Active Allergies bee venom protein (honey bee) Reglan Allergy Notes Electronic Signature(s) Signed: 11/08/2021 12:45:55 PM By: Dustin Coria RN Entered By: [name] on 11/07/2021 08:53:11 [place], [name] ([id number]) -------------------------------------------------------------------------------- Arrival Information Details Patient Name: [name] Date of Service: 11/07/2021 8:45 AM Medical Record Number: [id number] Patient Account Number: [id number] Date of Birth/Sex: 24-May-1960 (61 y.o. M) Treating RN: [name] Primary Care [name]: [name] Other Clinician: Referring [name]: [name] Treating [name]/Extender: [name] in Treatment: 0 Visit Information Patient Arrived: Ambulatory Arrival Time: 08:45 Accompanied By: self Transfer Assistance: None Patient Identification Verified: Yes Secondary Verification Process Completed: Yes Patient Requires Transmission-Based Precautions: No Patient Has Alerts: Yes Patient Alerts: 09/05/21 right 1.26 09/05/21 left 1.10 Electronic Signature(s) Signed: 11/08/2021 12:45:55 PM By: Dustin Coria RN Entered By: [name] on 11/07/2021 09:10:50 [place], [name] ([id number]) -------------------------------------------------------------------------------- Clinic Level of Care Assessment Details Patient Name: [name], [name] Date of Service: 11/07/2021 8:45 AM Medical Record Number: [id number] Patient Account Number: [id number] Date of Birth/Sex: 1960/05/16  (61 y.o. M) Treating RN: [name] Primary Care [name]: [name] Other Clinician: Referring [name]: [name] Treating [name]/Extender: [name] in Treatment: 0 Clinic Level of Care Assessment Items TOOL 1 Quantity Score X - Use when EandM and Procedure is performed on INITIAL visit 1 0 ASSESSMENTS - Nursing Assessment / Reassessment X - General Physical Exam (combine w/ comprehensive assessment (listed just below) when performed on new 1 20 pt. evals) X- 1 25 Comprehensive Assessment (HX, ROS, Risk Assessments, Wounds Hx, etc.) ASSESSMENTS - Wound and Skin Assessment / Reassessment []  - Dermatologic / Skin Assessment (not related to wound area) 0 ASSESSMENTS - Ostomy and/or Continence Assessment and Care []  - Incontinence Assessment and Management 0 []  - 0 Ostomy Care Assessment and Management (repouching, etc.) PROCESS - Coordination of Care X - Simple Patient / Family Education for ongoing care 1 15 []  - 0 Complex (extensive) Patient / Family Education for ongoing care X- 1 10 Staff obtains Consents, Records, Test Results / Process Orders []  - 0 Staff telephones HHA, Nursing Homes / Clarify orders / etc []  - 0 Routine Transfer to another Facility (non-emergent condition) []  - 0 Routine Hospital Admission (non-emergent condition) X- 1 15 New Admissions / Biomedical engineer / Ordering NPWT, Apligraf, etc. []  - 0 Emergency Hospital Admission (emergent condition) PROCESS - Special Needs []  - Pediatric / Minor Patient Management 0 []  - 0 Isolation Patient Management []  - 0 Hearing / Language / Visual special needs []  - 0 Assessment of Community assistance (transportation, D/C planning, etc.) []  - 0 Additional assistance / Altered mentation []  - 0 Support Surface(s) Assessment (bed, cushion, seat, etc.) INTERVENTIONS - Miscellaneous []  - External ear exam 0 []  - 0 Patient Transfer (multiple staff / Civil Service fast streamer / Similar  devices) []  - 0 Simple Staple / Suture removal (25 or less) []  - 0 Complex Staple / Suture removal (26 or more) []  - 0 Hypo/Hyperglycemic Management (do not check if billed separately) []  - 0 Ankle / Brachial Index (ABI) - do not check if billed separately Has the patient been seen at the hospital within  the last three years: Yes Total Score: 85 Level Of Care: New/Established - Level 3 Bessey, Ramzi L. ([id number]) Electronic Signature(s) Signed: 11/08/2021 12:45:55 PM By: Dustin Coria RN Entered By: [name] on 11/07/2021 09:51:04 [place], [name] ([id number]) -------------------------------------------------------------------------------- Encounter Discharge Information Details Patient Name: [name] Date of Service: 11/07/2021 8:45 AM Medical Record Number: [id number] Patient Account Number: [id number] Date of Birth/Sex: 01/03/1961 (61 y.o. M) Treating RN: [name] Primary Care Florie [name]: [name] Other Clinician: Referring [name]: [name] Treating [name]/Extender: [name] in Treatment: 0 Encounter Discharge Information Items Post Procedure Vitals Discharge Condition: Stable Temperature (F): 97.7 Ambulatory Status: Ambulatory Pulse (bpm): 52 Discharge Destination: Home Respiratory Rate (breaths/min): 18 Transportation: Private Auto Blood Pressure (mmHg): 173/89 Accompanied By: self Schedule Follow-up Appointment: No Clinical Summary of Care: Electronic Signature(s) Signed: 11/07/2021 9:52:34 AM By: Dustin Coria RN Entered By: [name] on 11/07/2021 09:52:34 Hailes, [name] ([id number]) -------------------------------------------------------------------------------- Lower Extremity Assessment Details Patient Name: [name] Date of Service: 11/07/2021 8:45 AM Medical Record Number: [id number] Patient Account Number: [id number] Date of Birth/Sex: 09-24-1960 (61 y.o. M) Treating RN: [name] Primary Care  Gussie Towson: [name] Other Clinician: Referring [name]: [name] Treating [name]/Extender: [name] in Treatment: 0 Edema Assessment Assessed: [Left: No] [Right: No] Edema: [Left: Ye] [Right: s] Calf Left: Right: Point of Measurement: 38 cm From Medial Instep 37 cm Ankle Left: Right: Point of Measurement: 12 cm From Medial Instep 21.5 cm Knee To Floor Left: Right: From Medial Instep 50 cm Vascular Assessment Pulses: Dorsalis Pedis Palpable: [Right:Yes] Notes outside results from 09/10/21 Right 1.26 left 1.10 Electronic Signature(s) Signed: 11/08/2021 12:45:55 PM By: Dustin Coria RN Entered By: [name] on 11/07/2021 09:10:04 [place], [name] ([id number]) -------------------------------------------------------------------------------- Multi Wound Chart Details Patient Name: [name] Date of Service: 11/07/2021 8:45 AM Medical Record Number: [id number] Patient Account Number: [id number] Date of Birth/Sex: 11/22/1960 (61 y.o. M) Treating RN: [name] Primary Care Shaniah Baltes: [name] Other Clinician: Referring Yosselin Zoeller: [name] Treating Reagen Haberman/Extender: [name] in Treatment: 0 Vital Signs Height(in): 77 Pulse(bpm): 52 Weight(lbs): 262 Blood Pressure(mmHg): 173/89 Body Mass Index(BMI): 31.1 Temperature(F): 97.7 Respiratory Rate(breaths/min): 18 Photos: [N/A:N/A] Wound Location: Right, Posterior Toe Great N/A N/A Wounding Event: Gradually Appeared N/A N/A Primary Etiology: Diabetic Wound/Ulcer of the Lower N/A N/A Extremity Comorbid History: Coronary Artery Disease, N/A N/A Hypertension, Peripheral Venous Disease, Type II Diabetes Date Acquired: 02/15/2020 N/A N/A [name] of Treatment: 0 N/A N/A Wound Status: Open N/A N/A Wound Recurrence: No N/A N/A Measurements L x W x D (cm) 0.9x0.2x0.2 N/A N/A Area (cm) : 0.141 N/A N/A Volume (cm) : 0.028 N/A N/A Classification: Grade 2 N/A N/A Exudate  Amount: Medium N/A N/A Exudate Type: Serosanguineous N/A N/A Exudate Color: red, brown N/A N/A Granulation Amount: Small (1-33%) N/A N/A Granulation Quality: Pink N/A N/A Necrotic Amount: Large (67-100%) N/A N/A Exposed Structures: Fat Layer (Subcutaneous Tissue): N/A N/A Yes Fascia: No Tendon: No Muscle: No Joint: No Bone: No Epithelialization: None N/A N/A Treatment Notes Electronic Signature(s) Signed: 11/08/2021 12:45:55 PM By: Dustin Coria RN Entered By: [name] on 11/07/2021 09:32:22 [place], [name] ([id number]) -------------------------------------------------------------------------------- [place] Details Patient Name: [name] Date of Service: 11/07/2021 8:45 AM Medical Record Number: [id number] Patient Account Number: [id number] Date of Birth/Sex: December 17, 1960 (61 y.o. M) Treating RN: [name] Primary Care Jayni Prescher: [name] Other Clinician: Referring Keeanna Villafranca: [name] Treating Donzell Coller/Extender: [name] in Treatment: 0 Active Inactive Nutrition Nursing Diagnoses:  Potential for alteratiion in Nutrition/Potential for imbalanced nutrition Goals: Patient/caregiver verbalizes understanding of need to maintain therapeutic glucose control per primary care physician Date Initiated: 11/07/2021 Target Resolution Date: 12/08/2021 Goal Status: Active Interventions: Assess HgA1c results as ordered upon admission and as needed Assess patient nutrition upon admission and as needed per policy Provide education on elevated blood sugars and impact on wound healing Provide education on nutrition Notes: Wound/Skin Impairment Nursing Diagnoses: Knowledge deficit related to ulceration/compromised skin integrity Goals: Patient/caregiver will verbalize understanding of skin care regimen Date Initiated: 11/07/2021 Target Resolution Date: 12/08/2021 Goal Status: Active Ulcer/skin breakdown will have a volume reduction of  30% by week 4 Date Initiated: 11/07/2021 Target Resolution Date: 01/07/2022 Goal Status: Active Ulcer/skin breakdown will have a volume reduction of 50% by week 8 Date Initiated: 11/07/2021 Target Resolution Date: 02/07/2022 Goal Status: Active Ulcer/skin breakdown will have a volume reduction of 80% by week 12 Date Initiated: 11/07/2021 Target Resolution Date: 03/10/2022 Goal Status: Active Ulcer/skin breakdown will heal within 14 [name] Date Initiated: 11/07/2021 Target Resolution Date: 04/15/2022 Goal Status: Active Interventions: Assess patient/caregiver ability to obtain necessary supplies Assess patient/caregiver ability to perform ulcer/skin care regimen upon admission and as needed Assess ulceration(s) every visit Notes: Electronic Signature(s) Signed: 11/08/2021 12:45:55 PM By: Dustin Coria RN Entered By: [name] on 11/07/2021 09:32:03 [place], [place]. ([id number]) -------------------------------------------------------------------------------- Pain Assessment Details Patient Name: [name] Date of Service: 11/07/2021 8:45 AM Medical Record Number: [id number] Patient Account Number: [id number] Date of Birth/Sex: 16-Apr-1960 (61 y.o. M) Treating RN: [name] Primary Care Roise Emert: [name] Other Clinician: Referring Wen Merced: [name] Treating Kanani Mowbray/Extender: [name] in Treatment: 0 Active Problems Location of Pain Severity and Description of Pain Patient Has Paino No Site Locations Pain Management and Medication Current Pain Management: Electronic Signature(s) Signed: 11/08/2021 12:45:55 PM By: Dustin Coria RN Entered By: [name] on 11/07/2021 08:50:48 [place], [name] ([id number]) -------------------------------------------------------------------------------- Patient/Caregiver Education Details Patient Name: [name] Date of Service: 11/07/2021 8:45 AM Medical Record Number: [id number] Patient Account Number:  [id number] Date of Birth/Gender: [birth year] (61 y.o. M) Treating RN: [name] Primary Care Physician: [name] Other Clinician: Referring Physician: Celesta [name] Treating Physician/Extender: [name] in Treatment: 0 Education Assessment Education Provided To: Patient Education Topics Provided Elevated Blood Sugar/ Impact on Healing: Methods: Explain/Verbal Responses: State content correctly Nutrition: Methods: Explain/Verbal Responses: State content correctly Electronic Signature(s) Signed: 11/08/2021 12:45:55 PM By: Dustin Coria RN Entered By: [name] on 11/07/2021 09:51:36 [place], [name] ([id number]) -------------------------------------------------------------------------------- Wound Assessment Details Patient Name: [name] Date of Service: 11/07/2021 8:45 AM Medical Record Number: [id number] Patient Account Number: [id number] Date of Birth/Sex: Feb 26, 1960 (61 y.o. M) Treating RN: [name] Primary Care Jaquarious Grey: [name] Other Clinician: Referring Clarkson Rosselli: [name] Treating Thia Olesen/Extender: [name] in Treatment: 0 Wound Status Wound Number: 1 Primary Diabetic Wound/Ulcer of the Lower Extremity Etiology: Wound Location: Right, Plantar Toe Great Wound Open Wounding Event: Gradually Appeared Status: Date Acquired: 02/15/2020 Comorbid Coronary Artery Disease, Hypertension, Peripheral [name] Of Treatment: 0 History: Venous Disease, Type II Diabetes Clustered Wound: No Photos Wound Measurements Length: (cm) 1.1 Width: (cm) 0.4 Depth: (cm) 0.3 Area: (cm) 0.346 Volume: (cm) 0.104 % Reduction in Area: % Reduction in Volume: Epithelialization: None Tunneling: No Undermining: No Wound Description Classification: Grade 2 Exudate Amount: Medium Exudate Type: Serosanguineous Exudate Color: red, brown Foul Odor After Cleansing: No Slough/Fibrino Yes Wound Bed Granulation Amount: Small (1-33%)  Exposed Structure Granulation Quality: Pink Fascia Exposed:  No Necrotic Amount: Large (67-100%) Fat Layer (Subcutaneous Tissue) Exposed: Yes Tendon Exposed: No Muscle Exposed: No Joint Exposed: No Bone Exposed: No Treatment Notes Wound #1 (Toe Great) Wound Laterality: Plantar, Right Cleanser Soap and Water Discharge Instruction: Gently cleanse wound with antibacterial soap, rinse and pat dry prior to dressing wounds Wound Cleanser Discharge Instruction: Wash your hands with soap and water. Remove old dressing, discard into plastic bag and place into trash. Cleanse the wound with Wound Cleanser prior to applying a clean dressing using gauze sponges, not tissues or cotton balls. Do not Grochowski, Jaydrian L. ([id number]) scrub or use excessive force. Pat dry using gauze sponges, not tissue or cotton balls. Peri-Wound Care Topical Primary Dressing Prisma 4.34 (in) Discharge Instruction: Moisten w/normal saline or sterile water; Cover wound as directed. Do not remove from wound bed. Secondary Dressing Gauze Secured With Medipore Tape - 44M Medipore H Soft Cloth Surgical Tape, 2x2 (in/yd) Compression Wrap Compression Stockings Add-Ons Electronic Signature(s) Signed: 11/08/2021 12:45:55 PM By: Dustin Coria RN Entered By: [name] on 11/07/2021 09:46:19 [place], [name] ([id number]) -------------------------------------------------------------------------------- Vitals Details Patient Name: [name] Date of Service: 11/07/2021 8:45 AM Medical Record Number: [id number] Patient Account Number: [id number] Date of Birth/Sex: September 03, 1960 (61 y.o. M) Treating RN: [name] Primary Care Infinity Jeffords: [name] Other Clinician: Referring Halley Shepheard: [name] Treating Armaan Pond/Extender: [name] in Treatment: 0 Vital Signs Time Taken: 08:50 Temperature (F): 97.7 Height (in): 77 Pulse (bpm): 52 Source: Stated Respiratory Rate (breaths/min): 18 Weight  (lbs): 262 Blood Pressure (mmHg): 173/89 Source: Stated Reference Range: 80 - 120 mg / dl Body Mass Index (BMI): 31.1 Notes patient reports being anxious about being here, does not take blood pressure medcaitions, denies lightheadedness, dizziness, blurred vision, ring of ears. alert and oreinted times 3, notified Kelli Churn PA Electronic Signature(s) Signed: 11/08/2021 12:45:55 PM By: Dustin Coria RN Entered By: [name] on 11/07/2021 08:52:51

## 2021-11-11 ENCOUNTER — Encounter: Payer: HMO | Admitting: Physician Assistant

## 2021-11-11 DIAGNOSIS — L97512 Non-pressure chronic ulcer of other part of right foot with fat layer exposed: Secondary | ICD-10-CM | POA: Diagnosis not present

## 2021-11-11 DIAGNOSIS — E11621 Type 2 diabetes mellitus with foot ulcer: Secondary | ICD-10-CM | POA: Diagnosis not present

## 2021-11-11 NOTE — Progress Notes (Signed)
ANSLEY, STANWOOD ([id number]) Visit Report for 11/11/2021 Chief Complaint Document Details Patient Name: [name], [name] Date of Service: 11/11/2021 9:15 AM Medical Record Number: [id number] Patient Account Number: [id number] Date of Birth/Sex: [birth year] (61 y.o. Male) Treating RN: Cornell Barman Primary Care Provider: Bernardo Heater Other Clinician: Referring Provider: Celesta Gentile Treating Provider/Extender: Skipper Cliche in Treatment: 0 Information Obtained from: Patient Chief Complaint Right great toe ulcer Electronic Signature(s) Signed: 11/11/2021 10:06:40 AM By: Worthy Keeler PA-C Entered By: Worthy Keeler on 11/11/2021 10:06:40 Daise, Reynolds Bowl ([id number]) -------------------------------------------------------------------------------- Debridement Details Patient Name: [name] Date of Service: 11/11/2021 9:15 AM Medical Record Number: [id number] Patient Account Number: [id number] Date of Birth/Sex: 01-Jan-1961 (61 y.o. Male) Treating RN: Cornell Barman Primary Care Provider: Bernardo Heater Other Clinician: Referring Provider: Celesta Gentile Treating Provider/Extender: Skipper Cliche in Treatment: 0 Debridement Performed for Wound #1 Right,Plantar Toe Great Assessment: Performed By: Physician Tommie Sams., PA-C Debridement Type: Debridement Severity of Tissue Pre Debridement: Fat layer exposed Level of Consciousness (Pre- Awake and Alert procedure): Pre-procedure Verification/Time Out Yes - 10:08 Taken: Total Area Debrided (L x W): 0.7 (cm) x 0.3 (cm) = 0.21 (cm) Tissue and other material Viable, Non-Viable, Slough, Subcutaneous, Slough debrided: Level: Skin/Subcutaneous Tissue Debridement Description: Excisional Instrument: Curette Bleeding: Minimum Hemostasis Achieved: Pressure Response to Treatment: Procedure was tolerated well Level of Consciousness (Post- Awake and Alert procedure): Post Debridement Measurements of Total  Wound Length: (cm) 0.7 Width: (cm) 0.5 Depth: (cm) 0.4 Volume: (cm) 0.11 Character of Wound/Ulcer Post Debridement: Stable Severity of Tissue Post Debridement: Fat layer exposed Post Procedure Diagnosis Same as Pre-procedure Electronic Signature(s) Signed: 11/11/2021 6:29:15 PM By: Gretta Cool, BSN, RN, CWS, Kim RN, BSN Signed: 11/12/2021 6:10:21 PM By: Worthy Keeler PA-C Entered By: Gretta Cool, BSN, RN, CWS, Kim on 11/11/2021 10:09:36 Doom, Reynolds Bowl ([id number]) -------------------------------------------------------------------------------- HPI Details Patient Name: [name] Date of Service: 11/11/2021 9:15 AM Medical Record Number: [id number] Patient Account Number: [id number] Date of Birth/Sex: 02-04-1961 (61 y.o. Male) Treating RN: Cornell Barman Primary Care Provider: Bernardo Heater Other Clinician: Referring Provider: Celesta Gentile Treating Provider/Extender: Skipper Cliche in Treatment: 0 History of Present Illness HPI Description: 11-07-2021 upon evaluation today patient appears to be doing somewhat poorly in regard to a wound on his right plantar great toe. Fortunately there does not appear to be any evidence of active infection locally or systemically at this time which is great news. No fevers, chills, nausea, vomiting, or diarrhea. With that being said he tells me that he really has not been noticing a lot of improvement either with regard to the treatment. He has been under the care of Dr. Earleen Newport up to this point for roughly a year. More recently he did have an MRI which revealed the potential for possible osteomyelitis. With that being said I do believe that he definitely should probably be monitored for this but to be honest the Elesa Hacker that he was given probably did a good job. Considering there was not much being seen on MRI I do not think there is a high likelihood of severe osteomyelitis at this point we will definitely keep an eye on things however. Nonetheless  right now has been utilizing Silvadene cream I definitely think we can find something better to help get this moving in the right direction. Patient does have a history of diabetes mellitus type 2, chronic kidney disease stage III, hypertension, and again he has had an amputation of the left leg currently. The right  leg is the 1 that we are actually treating the great toe wound on. He has a below-knee amputation. 11-11-2021 upon evaluation today patient's wound actually is showing signs of excellent improvement. I am actually very pleased even compared to last week this is already better. He is very happy as well. Electronic Signature(s) Signed: 11/12/2021 5:59:26 PM By: Worthy Keeler PA-C Entered By: Worthy Keeler on 11/12/2021 17:59:26 Schertzer, Reynolds Bowl ([id number]) -------------------------------------------------------------------------------- Physical Exam Details Patient Name: [name] Date of Service: 11/11/2021 9:15 AM Medical Record Number: [id number] Patient Account Number: [id number] Date of Birth/Sex: May 04, 1960 (61 y.o. Male) Treating RN: Cornell Barman Primary Care Provider: Bernardo Heater Other Clinician: Referring Provider: Celesta Gentile Treating Provider/Extender: Jeri Cos Weeks in Treatment: 0 Constitutional Well-nourished and well-hydrated in no acute distress. Respiratory normal breathing without difficulty. Psychiatric this patient is able to make decisions and demonstrates good insight into disease process. Alert and Oriented x 3. pleasant and cooperative. Notes Patient's wound did require sharp debridement of clearway some of the necrotic debris tolerated that today without complication postdebridement wound bed appears to be doing much better which is great news and overall I am extremely pleased with where we stand. Electronic Signature(s) Signed: 11/12/2021 5:59:39 PM By: Worthy Keeler PA-C Entered By: Worthy Keeler on 11/12/2021  17:59:39 Russum, Reynolds Bowl ([id number]) -------------------------------------------------------------------------------- Physician Orders Details Patient Name: [name] Date of Service: 11/11/2021 9:15 AM Medical Record Number: [id number] Patient Account Number: [id number] Date of Birth/Sex: [birth year] (61 y.o. Male) Treating RN: Cornell Barman Primary Care Provider: Bernardo Heater Other Clinician: Referring Provider: Celesta Gentile Treating Provider/Extender: Skipper Cliche in Treatment: 0 Verbal / Phone Orders: No Diagnosis Coding ICD-10 Coding Code Description E11.621 Type 2 diabetes mellitus with foot ulcer L97.512 Non-pressure chronic ulcer of other part of right foot with fat layer exposed N18.30 Chronic kidney disease, stage 3 unspecified I10 Essential (primary) hypertension Follow-up Appointments o Return Appointment in 1 week. Bathing/ Shower/ Hygiene o May shower; gently cleanse wound with antibacterial soap, rinse and pat dry prior to dressing wounds o No tub bath. Anesthetic (Use 'Patient Medications' Section for Anesthetic Order Entry) o Lidocaine applied to wound bed Edema Control - Lymphedema / Segmental Compressive Device / Other o Elevate, Exercise Daily and Avoid Standing for Long Periods of Time. o Elevate legs to the level of the heart and pump ankles as often as possible o Elevate leg(s) parallel to the floor when sitting. Wound Treatment Wound #1 - Toe Great Wound Laterality: Plantar, Right Cleanser: Wound Cleanser 3 x Per Week Discharge Instructions: Wash your hands with soap and water. Remove old dressing, discard into plastic bag and place into trash. Cleanse the wound with Wound Cleanser prior to applying a clean dressing using gauze sponges, not tissues or cotton balls. Do not scrub or use excessive force. Pat dry using gauze sponges, not tissue or cotton balls. Primary Dressing: Prisma 4.34 (in) 3 x Per Week Discharge  Instructions: Moisten w/normal saline or sterile water; Cover wound as directed. Do not remove from wound bed. Secondary Dressing: Gauze 3 x Per Week Secured With: Medipore Tape - 33M Medipore H Soft Cloth Surgical Tape, 2x2 (in/yd) 3 x Per Week Electronic Signature(s) Signed: 11/11/2021 6:29:15 PM By: Gretta Cool, BSN, RN, CWS, Kim RN, BSN Signed: 11/12/2021 6:10:21 PM By: Worthy Keeler PA-C Entered By: Gretta Cool, BSN, RN, CWS, Kim on 11/11/2021 10:10:05 Pantaleo, Reynolds Bowl ([id number]) -------------------------------------------------------------------------------- Problem List Details Patient Name: [name] Date of Service: 11/11/2021 9:15  AM Medical Record Number: [id number] Patient Account Number: [id number] Date of Birth/Sex: [birth year] (61 y.o. Male) Treating RN: Cornell Barman Primary Care Provider: Bernardo Heater Other Clinician: Referring Provider: Celesta Gentile Treating Provider/Extender: Jeri Cos Weeks in Treatment: 0 Active Problems ICD-10 Encounter Code Description Active Date MDM Diagnosis E11.621 Type 2 diabetes mellitus with foot ulcer 11/07/2021 No Yes L97.512 Non-pressure chronic ulcer of other part of right foot with fat layer 11/07/2021 No Yes exposed N18.30 Chronic kidney disease, stage 3 unspecified 11/07/2021 No Yes I10 Essential (primary) hypertension 11/07/2021 No Yes Inactive Problems Resolved Problems Electronic Signature(s) Signed: 11/11/2021 10:06:37 AM By: Worthy Keeler PA-C Entered By: Worthy Keeler on 11/11/2021 10:06:37 Jimenez, Reynolds Bowl ([id number]) -------------------------------------------------------------------------------- Progress Note Details Patient Name: [name] Date of Service: 11/11/2021 9:15 AM Medical Record Number: [id number] Patient Account Number: [id number] Date of Birth/Sex: [birth year] (61 y.o. Male) Treating RN: Cornell Barman Primary Care Provider: Bernardo Heater Other Clinician: Referring Provider: Celesta Gentile Treating Provider/Extender: Skipper Cliche in Treatment: 0 Subjective Chief Complaint Information obtained from Patient Right great toe ulcer History of Present Illness (HPI) 11-07-2021 upon evaluation today patient appears to be doing somewhat poorly in regard to a wound on his right plantar great toe. Fortunately there does not appear to be any evidence of active infection locally or systemically at this time which is great news. No fevers, chills, nausea, vomiting, or diarrhea. With that being said he tells me that he really has not been noticing a lot of improvement either with regard to the treatment. He has been under the care of Dr. Earleen Newport up to this point for roughly a year. More recently he did have an MRI which revealed the potential for possible osteomyelitis. With that being said I do believe that he definitely should probably be monitored for this but to be honest the Elesa Hacker that he was given probably did a good job. Considering there was not much being seen on MRI I do not think there is a high likelihood of severe osteomyelitis at this point we will definitely keep an eye on things however. Nonetheless right now has been utilizing Silvadene cream I definitely think we can find something better to help get this moving in the right direction. Patient does have a history of diabetes mellitus type 2, chronic kidney disease stage III, hypertension, and again he has had an amputation of the left leg currently. The right leg is the 1 that we are actually treating the great toe wound on. He has a below-knee amputation. 11-11-2021 upon evaluation today patient's wound actually is showing signs of excellent improvement. I am actually very pleased even compared to last week this is already better. He is very happy as well. Objective Constitutional Well-nourished and well-hydrated in no acute distress. Vitals Time Taken: 9:28 AM, Height: 77 in, Weight: 262 lbs, BMI: 31.1,  Temperature: 97.8 F, Pulse: 83 bpm, Respiratory Rate: 18 breaths/min, Blood Pressure: 166/90 mmHg. General Notes: Patient instructed to follow-up with PCP for BP. Patient has visit scheduled tomorrow. Respiratory normal breathing without difficulty. Psychiatric this patient is able to make decisions and demonstrates good insight into disease process. Alert and Oriented x 3. pleasant and cooperative. General Notes: Patient's wound did require sharp debridement of clearway some of the necrotic debris tolerated that today without complication postdebridement wound bed appears to be doing much better which is great news and overall I am extremely pleased with where we stand. Integumentary (Hair, Skin) Wound #1 status is Open. Original  cause of wound was Gradually Appeared. The date acquired was: 02/15/2020. The wound is located on the [employer]. The wound measures 0.7cm length x 0.3cm width x 0.3cm depth; 0.165cm^2 area and 0.049cm^3 volume. There is Fat Layer (Subcutaneous Tissue) exposed. There is undermining starting at 12:00 and ending at 6:00 with a maximum distance of 0.5cm. There is a large amount of sanguinous drainage noted. There is medium (34-66%) pink granulation within the wound bed. There is a medium (34-66%) amount of necrotic tissue within the wound bed including Adherent Slough. General Notes: Callus surrounding wound Assessment Polcyn, Kolbe L. ([id number]) Active Problems ICD-10 Type 2 diabetes mellitus with foot ulcer Non-pressure chronic ulcer of other part of right foot with fat layer exposed Chronic kidney disease, stage 3 unspecified Essential (primary) hypertension Procedures Wound #1 Pre-procedure diagnosis of Wound #1 is a Diabetic Wound/Ulcer of the Lower Extremity located on the Right,Plantar Toe Great .Severity of Tissue Pre Debridement is: Fat layer exposed. There was a Excisional Skin/Subcutaneous Tissue Debridement with a total area of 0.21 sq  cm performed by Tommie Sams., PA-C. With the following instrument(s): Curette to remove Viable and Non-Viable tissue/material. Material removed includes Subcutaneous Tissue and Slough and. No specimens were taken. A time out was conducted at 10:08, prior to the start of the procedure. A Minimum amount of bleeding was controlled with Pressure. The procedure was tolerated well. Post Debridement Measurements: 0.7cm length x 0.5cm width x 0.4cm depth; 0.11cm^3 volume. Character of Wound/Ulcer Post Debridement is stable. Severity of Tissue Post Debridement is: Fat layer exposed. Post procedure Diagnosis Wound #1: Same as Pre-Procedure Plan Follow-up Appointments: Return Appointment in 1 week. Bathing/ Shower/ Hygiene: May shower; gently cleanse wound with antibacterial soap, rinse and pat dry prior to dressing wounds No tub bath. Anesthetic (Use 'Patient Medications' Section for Anesthetic Order Entry): Lidocaine applied to wound bed Edema Control - Lymphedema / Segmental Compressive Device / Other: Elevate, Exercise Daily and Avoid Standing for Long Periods of Time. Elevate legs to the level of the heart and pump ankles as often as possible Elevate leg(s) parallel to the floor when sitting. WOUND #1: - Toe Great Wound Laterality: Plantar, Right Cleanser: Wound Cleanser 3 x Per Week/ Discharge Instructions: Wash your hands with soap and water. Remove old dressing, discard into plastic bag and place into trash. Cleanse the wound with Wound Cleanser prior to applying a clean dressing using gauze sponges, not tissues or cotton balls. Do not scrub or use excessive force. Pat dry using gauze sponges, not tissue or cotton balls. Primary Dressing: Prisma 4.34 (in) 3 x Per Week/ Discharge Instructions: Moisten w/normal saline or sterile water; Cover wound as directed. Do not remove from wound bed. Secondary Dressing: Gauze 3 x Per Week/ Secured With: Medipore Tape - 75M Medipore H Soft Cloth Surgical  Tape, 2x2 (in/yd) 3 x Per Week/ 1. I am going to suggest that we have the patient continue with the collagen which I think is doing a great job here. 2. I am also can recommend that we have him continue with the gauze to cover and roll gauze to secure in place. 3. He should also continue with appropriate offloading right now he has been doing the offloading. 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 He tells me that his last chart he is doing this today and that he is going. He is not able to use really a good offloading shoe due to having a prosthesis due to a BKA on the  left leg. For that reason wants it done with the mowing he tells me that he is going to just basically stay off of this is much as he can which should help it heal effectively. We will see patient back for reevaluation in 1 week here in the clinic. If anything worsens or changes patient will contact our office for additional recommendations. Electronic Signature(s) Signed: 11/12/2021 6:00:39 PM By: Worthy Keeler PA-C Entered By: Worthy Keeler on 11/12/2021 18:00:38 Ciullo, Reynolds Bowl ([id number]) -------------------------------------------------------------------------------- SuperBill Details Patient Name: [name] Date of Service: 11/11/2021 Medical Record Number: [id number] Patient Account Number: [id number] Date of Birth/Sex: 10/24/1960 (61 y.o. Male) Treating RN: Cornell Barman Primary Care Provider: Bernardo Heater Other Clinician: Referring Provider: Celesta Gentile Treating Provider/Extender: Jeri Cos Weeks in Treatment: 0 Diagnosis Coding ICD-10 Codes Code Description [phone] Type 2 diabetes mellitus with foot ulcer L97.512 Non-pressure chronic ulcer of other part of right foot with fat layer exposed N18.30 Chronic kidney disease, stage 3 unspecified I10 Essential (primary) hypertension Facility Procedures CPT4 Code: [id number] Description: 89842 - DEB SUBQ TISSUE 20 SQ CM/< Modifier: Quantity: 1 CPT4  Code: Description: ICD-10 Diagnosis Description L97.512 Non-pressure chronic ulcer of other part of right foot with fat layer ex Modifier: posed Quantity: Physician Procedures CPT4 Code: [id number] Description: 11042 - WC PHYS SUBQ TISS 20 SQ CM Modifier: Quantity: 1 CPT4 Code: Description: ICD-10 Diagnosis Description L97.512 Non-pressure chronic ulcer of other part of right foot with fat layer ex Modifier: posed Quantity: Electronic Signature(s) Signed: 11/12/2021 6:00:49 PM By: Worthy Keeler PA-C Entered By: Worthy Keeler on 11/12/2021 18:00:48

## 2021-11-11 NOTE — Progress Notes (Signed)
[name], [name] ([id number]) Visit Report for 11/11/2021 Arrival Information Details Patient Name: [name] Date of Service: 11/11/2021 9:15 AM Medical Record Number: [id number] Patient Account Number: [id number] Date of Birth/Sex: Jun 09, 1960 (61 y.o. M) Treating [name]: [name] Primary Care [name]: [name] Other Clinician: Referring [name]: [name] Treating [name]/Extender: [name] in Treatment: 0 Visit Information History Since Last Visit Added or deleted any medications: No Patient Arrived: Ambulatory Has Dressing in Place as Prescribed: Yes Arrival Time: 09:19 Pain Present Now: No Accompanied By: self Transfer Assistance: None Patient Identification Verified: Yes Secondary Verification Process Completed: Yes Patient Requires Transmission-Based Precautions: No Patient Has Alerts: Yes Patient Alerts: 09/05/21 right 1.26 09/05/21 left 1.10 Electronic Signature(s) Signed: 11/11/2021 6:29:15 PM By: [name] Entered By: [name], BSN, [name], CWS, Dustin on 11/11/2021 09:28:43 [place], [name] ([id number]) -------------------------------------------------------------------------------- Encounter Discharge Information Details Patient Name: [name] Date of Service: 11/11/2021 9:15 AM Medical Record Number: [id number] Patient Account Number: [id number] Date of Birth/Sex: 08-29-1960 (61 y.o. M) Treating [name]: [name] Primary Care [name]: [name] Other Clinician: Referring [name]: [name] Treating Kalaoa [name]/Extender: [name] in Treatment: 0 Encounter Discharge Information Items Post Procedure Vitals Discharge Condition: Stable Temperature (F): 97.8 Ambulatory Status: Ambulatory Pulse (bpm): 53 Discharge Destination: Home Respiratory Rate (breaths/min): 16 Transportation: Private Auto Blood Pressure (mmHg): 166/90 Accompanied By: self Schedule Follow-up Appointment: Yes Clinical Summary  of Care: Electronic Signature(s) Signed: 11/11/2021 6:29:15 PM By: [name] Entered By: [name], BSN, [name], CWS, Dustin on 11/11/2021 10:21:49 [place], [name] ([id number]) -------------------------------------------------------------------------------- Lower Extremity Assessment Details Patient Name: [name] Date of Service: 11/11/2021 9:15 AM Medical Record Number: [id number] Patient Account Number: [id number] Date of Birth/Sex: [birth year] (61 y.o. M) Treating [name]: [name] Primary Care [name]: [name] Other Clinician: Referring [name]: [name] Treating Kerrington Greenhalgh/Extender: [name] in Treatment: 0 Vascular Assessment Pulses: Dorsalis Pedis Palpable: [Right:Yes] Electronic Signature(s) Signed: 11/11/2021 6:29:15 PM By: [name] Entered By: [name], BSN, [name], CWS, Dustin on 11/11/2021 09:36:02 [place], [name] ([id number]) -------------------------------------------------------------------------------- Multi Wound Chart Details Patient Name: [name] Date of Service: 11/11/2021 9:15 AM Medical Record Number: [id number] Patient Account Number: [id number] Date of Birth/Sex: 1960-04-04 (61 y.o. M) Treating [name]: [name] Primary Care [name]: [name] Other Clinician: Referring [name]: [name] Treating [name]/Extender: [name] in Treatment: 0 Vital Signs Height(in): 77 Pulse(bpm): 34 Weight(lbs): 262 Blood Pressure(mmHg): 166/90 Body Mass Index(BMI): 31.1 Temperature(F): 97.8 Respiratory Rate(breaths/min): 18 Photos: [N/A:N/A] Wound Location: Right, Plantar Toe Great N/A N/A Wounding Event: Gradually Appeared N/A N/A Primary Etiology: Diabetic Wound/Ulcer of the Lower N/A N/A Extremity Comorbid History: Coronary Artery Disease, N/A N/A Hypertension, Peripheral Venous Disease, Type II Diabetes Date Acquired: 02/15/2020 N/A N/A Weeks of Treatment: 0 N/A  N/A Wound Status: Open N/A N/A Wound Recurrence: No N/A N/A Measurements L x W x D (cm) 0.7x0.3x0.3 N/A N/A Area (cm) : 0.165 N/A N/A Volume (cm) : 0.049 N/A N/A % Reduction in Area: 52.30% N/A N/A % Reduction in Volume: 52.90% N/A N/A Starting Position 1 (o'clock): 12 Ending Position 1 (o'clock): 6 Maximum Distance 1 (cm): 0.5 Undermining: Yes N/A N/A Classification: Grade 2 N/A N/A Exudate Amount: Large N/A N/A Exudate Type: Sanguinous N/A N/A Exudate Color: red N/A N/A Granulation Amount: Medium (34-66%) N/A N/A Granulation Quality: Pink N/A N/A Necrotic Amount: Medium (34-66%) N/A N/A Exposed Structures: Fat Layer (Subcutaneous Tissue): N/A  N/A Yes Fascia: No Tendon: No Muscle: No Joint: No Bone: No Epithelialization: None N/A N/A Assessment Notes: Callus surrounding wound N/A N/A Treatment Notes Electronic Signature(s) [name] ([id number]) Signed: 11/11/2021 6:29:15 PM By: [name] Entered By: [name], BSN, [name], CWS, Dustin on 11/11/2021 09:36:55 [place], [name] ([id number]) -------------------------------------------------------------------------------- [place] Details Patient Name: [name] Date of Service: 11/11/2021 9:15 AM Medical Record Number: [id number] Patient Account Number: [id number] Date of Birth/Sex: 22-Apr-1960 (61 y.o. M) Treating [name]: [name] Primary Care [name]: [name] Other Clinician: Referring [name]: [name] Treating [name]/Extender: [name] in Treatment: 0 Active Inactive Necrotic Tissue Nursing Diagnoses: Impaired tissue integrity related to necrotic/devitalized tissue Knowledge deficit related to management of necrotic/devitalized tissue Goals: Necrotic/devitalized tissue will be minimized in the wound bed Date Initiated: 11/11/2021 Target Resolution Date: 11/11/2021 Goal Status: Active Patient/caregiver will verbalize understanding of reason  and process for debridement of necrotic tissue Date Initiated: 11/11/2021 Target Resolution Date: 11/11/2021 Goal Status: Active Interventions: Assess patient pain level pre-, during and post procedure and prior to discharge Provide education on necrotic tissue and debridement process Treatment Activities: Excisional debridement : 11/11/2021 Notes: Nutrition Nursing Diagnoses: Potential for alteratiion in Nutrition/Potential for imbalanced nutrition Goals: Patient/caregiver verbalizes understanding of need to maintain therapeutic glucose control per primary care physician Date Initiated: 11/07/2021 Target Resolution Date: 12/08/2021 Goal Status: Active Interventions: Assess HgA1c results as ordered upon admission and as needed Assess patient nutrition upon admission and as needed per policy Provide education on elevated blood sugars and impact on wound healing Provide education on nutrition Treatment Activities: Education provided on Nutrition : 11/07/2021 Notes: Wound/Skin Impairment Nursing Diagnoses: Knowledge deficit related to ulceration/compromised skin integrity Goals: Patient/caregiver will verbalize understanding of skin care regimen Date Initiated: 11/07/2021 Target Resolution Date: 12/08/2021 Goal Status: Active Ulcer/skin breakdown will have a volume reduction of 30% by week 4 Lundquist, Vaishnav L. ([id number]) Date Initiated: 11/07/2021 Target Resolution Date: 01/07/2022 Goal Status: Active Ulcer/skin breakdown will have a volume reduction of 50% by week 8 Date Initiated: 11/07/2021 Target Resolution Date: 02/07/2022 Goal Status: Active Ulcer/skin breakdown will have a volume reduction of 80% by week 12 Date Initiated: 11/07/2021 Target Resolution Date: 03/10/2022 Goal Status: Active Ulcer/skin breakdown will heal within 14 weeks Date Initiated: 11/07/2021 Target Resolution Date: 04/15/2022 Goal Status: Active Interventions: Assess patient/caregiver ability to obtain  necessary supplies Assess patient/caregiver ability to perform ulcer/skin care regimen upon admission and as needed Assess ulceration(s) every visit Notes: Electronic Signature(s) Signed: 11/11/2021 6:29:15 PM By: [name] Entered By: [name], BSN, [name], CWS, Dustin on 11/11/2021 09:36:28 [place], [name] ([id number]) -------------------------------------------------------------------------------- Pain Assessment Details Patient Name: [name] Date of Service: 11/11/2021 9:15 AM Medical Record Number: [id number] Patient Account Number: [id number] Date of Birth/Sex: 1960/07/17 (61 y.o. M) Treating [name]: [name] Primary Care Breawna Montenegro: [name] Other Clinician: Referring Adrian Dinovo: [name] Treating Jermichael Belmares/Extender: [name] in Treatment: 0 Active Problems Location of Pain Severity and Description of Pain Patient Has Paino No Site Locations With Dressing Change: No Pain Management and Medication Current Pain Management: Notes Patient denies pain at this time. Electronic Signature(s) Signed: 11/11/2021 6:29:15 PM By: [name] Entered By: [name], BSN, [name], CWS, Dustin on 11/11/2021 09:30:05 [place], [name] ([id number]) -------------------------------------------------------------------------------- Wound Assessment Details Patient Name: [name] Date of Service: 11/11/2021 9:15 AM Medical Record Number: [id number] Patient Account Number: [id number] Date of Birth/Sex: March 24, 1960 (  61 y.o. M) Treating [name]: [name] Primary Care Tajon Moring: [name] Other Clinician: Referring Sadarius Norman: [name] Treating Jnae Thomaston/Extender: [name] in Treatment: 0 Wound Status Wound Number: 1 Primary Diabetic Wound/Ulcer of the Lower Extremity Etiology: Wound Location: Right, Plantar Toe Great Wound Open Wounding Event: Gradually Appeared Status: Date Acquired: 02/15/2020 Comorbid Coronary Artery  Disease, Hypertension, Peripheral Weeks Of Treatment: 0 History: Venous Disease, Type II Diabetes Clustered Wound: No Photos Wound Measurements Length: (cm) 0.7 % Red Width: (cm) 0.3 % Red Depth: (cm) 0.3 Epith Area: (cm) 0.165 Unde Volume: (cm) 0.049 S En Ma uction in Area: 52.3% uction in Volume: 52.9% elialization: None rmining: Yes tarting Position (o'clock): 12 ding Position (o'clock): 6 ximum Distance: (cm) 0.5 Wound Description Classification: Grade 2 Foul Exudate Amount: Large Sloug Exudate Type: Sanguinous Exudate Color: red Odor After Cleansing: No h/Fibrino Yes Wound Bed Granulation Amount: Medium (34-66%) Exposed Structure Granulation Quality: Pink Fascia Exposed: No Necrotic Amount: Medium (34-66%) Fat Layer (Subcutaneous Tissue) Exposed: Yes Necrotic Quality: Adherent Slough Tendon Exposed: No Muscle Exposed: No Joint Exposed: No Bone Exposed: No Assessment Notes Callus surrounding wound Treatment Notes Wound #1 (Toe Great) Wound Laterality: Plantar, Right Tacey, Lewie L. ([id number]) Cleanser Wound Cleanser Discharge Instruction: Wash your hands with soap and water. Remove old dressing, discard into plastic bag and place into trash. Cleanse the wound with Wound Cleanser prior to applying a clean dressing using gauze sponges, not tissues or cotton balls. Do not scrub or use excessive force. Pat dry using gauze sponges, not tissue or cotton balls. Peri-Wound Care Topical Primary Dressing Prisma 4.34 (in) Discharge Instruction: Moisten w/normal saline or sterile water; Cover wound as directed. Do not remove from wound bed. Secondary Dressing Gauze Secured With Medipore Tape - 23M Medipore H Soft Cloth Surgical Tape, 2x2 (in/yd) Compression Wrap Compression Stockings Add-Ons Electronic Signature(s) Signed: 11/11/2021 6:29:15 PM By: [name] Entered By: [name], BSN, [name], CWS, Dustin on 11/11/2021 09:35:39 [place], [name]  ([id number]) -------------------------------------------------------------------------------- [place] Details Patient Name: [name] Date of Service: 11/11/2021 9:15 AM Medical Record Number: [id number] Patient Account Number: [id number] Date of Birth/Sex: 1961-01-17 (61 y.o. M) Treating [name]: [name] Primary Care Milt Coye: [name] Other Clinician: Referring Diany Formosa: [name] Treating Kinisha Soper/Extender: [name] in Treatment: 0 Vital Signs Time Taken: 09:28 Temperature (F): 97.8 Height (in): 77 Pulse (bpm): 83 Weight (lbs): 262 Respiratory Rate (breaths/min): 18 Body Mass Index (BMI): 31.1 Blood Pressure (mmHg): 166/90 Reference Range: 80 - 120 mg / dl Notes Patient instructed to follow-up with PCP for BP. Patient has visit scheduled tomorrow. Electronic Signature(s) Signed: 11/11/2021 6:29:15 PM By: [name] Entered By: [name], BSN, [name], CWS, Dustin on 11/11/2021 09:30:20

## 2021-11-25 ENCOUNTER — Ambulatory Visit: Payer: HMO | Admitting: Physician Assistant

## 2021-12-02 ENCOUNTER — Encounter: Payer: HMO | Admitting: Physician Assistant

## 2021-12-02 DIAGNOSIS — E11621 Type 2 diabetes mellitus with foot ulcer: Secondary | ICD-10-CM | POA: Diagnosis not present

## 2021-12-02 NOTE — Progress Notes (Addendum)
COLIN, ELLERS ([id number]) [id number] Page 1 of 9 Visit Report for 12/02/2021 Arrival Information Details Patient Name: Date of Service: [name], [name] 12/02/2021 10:00 A M Medical Record Number: [id number] Patient Account Number: [id number] Date of Birth/Sex: Treating RN: 25-Sep-1960 (61 y.o. [name] Primary Care [name]: [name] Other Clinician: Referring [name]: Treating [name]/Extender: [name]: 3 Visit Information History Since Last Visit Added or deleted any medications: No Patient Arrived: Ambulatory Any new allergies or adverse reactions: No Arrival Time: 10:28 Had a fall or experienced change in No Accompanied By: self activities of daily living that may affect Transfer Assistance: None risk of falls: Patient Requires Transmission-Based Precautions: No Hospitalized since last visit: No Patient Has Alerts: Yes Pain Present Now: No Patient Alerts: 09/05/21 right 1.26 09/05/21 left 1.10 Electronic Signature(s) Signed: 12/02/2021 2:10:55 PM By: Dustin Loud MSN RN CNS WTA Entered By: [name] on 12/02/2021 10:28:50 -------------------------------------------------------------------------------- Clinic Level of Care Assessment Details Patient Name: Date of Service: [name], [name] 12/02/2021 10:00 A M Medical Record Number: [id number] Patient Account Number: [id number] Date of Birth/Sex: Treating RN: September 07, 1960 (61 y.o. [name] Primary Care [name]: [name] Other Clinician: Referring [name]: Treating [name]/Extender: [name]: 3 Clinic Level of Care Assessment Items TOOL 1 Quantity Score _0  - 0 Use when EandM and Procedure is performed on INITIAL visit ASSESSMENTS - Nursing Assessment / Reassessment _1  - 0 General Physical Exam (combine w/ comprehensive assessment (listed just below) when performed on new pt.  evals) _2  - 0 Comprehensive Assessment (HX, ROS, Risk Assessments, Wounds Hx, etc.) ASSESSMENTS - Wound and Skin Assessment / Reassessment _3  - 0 Dermatologic / Skin Assessment (not related to wound area) ASSESSMENTS - Ostomy and/or Continence Assessment and Care _4  - 0 Incontinence Assessment and Management [name], [name] ([id number]) [id number] Page 2 of 9 _5  - 0 Ostomy Care Assessment and Management (repouching, etc.) PROCESS - Coordination of Care _6  - 0 Simple Patient / Family Education for ongoing care _7  - 0 Complex (extensive) Patient / Family Education for ongoing care _8  - 0 Staff obtains Programmer, systems, Records, T Results / Process Orders est _9  - 0 Staff telephones HHA, Nursing Homes / Clarify orders / etc _10  - 0 Routine Transfer to another Facility (non-emergent condition) _11  - 0 Routine Hospital Admission (non-emergent condition) _12  - 0 New Admissions / Biomedical engineer / Ordering NPWT Apligraf, etc. , _13  - 0 Emergency Hospital Admission (emergent condition) PROCESS - Special Needs _14  - 0 Pediatric / Minor Patient Management _15  - 0 Isolation Patient Management _16  - 0 Hearing / Language / Visual special needs _17  - 0 Assessment of Community assistance (transportation, D/C planning, etc.) _18  - 0 Additional assistance / Altered mentation _19  - 0 Support Surface(s) Assessment (bed, cushion, seat, etc.) INTERVENTIONS - Miscellaneous _20  - 0 External ear exam _21  - 0 Patient Transfer (multiple staff / Civil Service fast streamer / Similar devices) _22  - 0 Simple Staple / Suture removal (25 or less) _23  - 0 Complex Staple / Suture removal (26 or more) _24  - 0 Hypo/Hyperglycemic Management (do not check if billed separately) _25  - 0 Ankle / Brachial Index (ABI) - do not check if billed separately Has the patient been seen at the hospital within the last three years: Yes Total Score: 0 Level Of Care: ____ Electronic Signature(s) Signed:  12/02/2021 2:10:55 PM By: Dustin Loud MSN RN CNS WTA Entered By: [name] on 12/02/2021 10:50:59 -------------------------------------------------------------------------------- Encounter  Discharge Information Details Patient Name: Date of Service: [name], [name] 12/02/2021 10:00 A M Medical Record Number: [id number] Patient Account Number: [id number] Date of Birth/Sex: Treating RN: [birth year] (61 y.o. [name] Primary Care Karsin Pesta: [name] Other Clinician: Referring Korryn Pancoast: Treating Haniya Fern/Extender: [name]: 3 Encounter Discharge Information Items Discharge Condition: Stable Ambulatory Status: [place], [place] ([id number]) [id number] Page 3 of 9 Discharge Destination: Home Transportation: Private Auto Schedule Follow-up Appointment: Yes Clinical Summary of Care: Electronic Signature(s) Signed: 12/02/2021 2:10:55 PM By: Dustin Loud MSN RN CNS WTA Entered By: [name] on 12/02/2021 10:52:09 -------------------------------------------------------------------------------- Lower Extremity Assessment Details Patient Name: Date of Service: Dustin Oxford 12/02/2021 10:00 A M Medical Record Number: [id number] Patient Account Number: [id number] Date of Birth/Sex: Treating RN: 06-01-1960 (61 y.o. [name] Primary Care Crystina Borrayo: [name] Other Clinician: Referring Lenzi Marmo: Treating Tayshun Gappa/Extender: [name]: 3 Vascular Assessment Pulses: Dorsalis Pedis Palpable: [Right:Yes] Electronic Signature(s) Signed: 12/02/2021 2:10:55 PM By: Dustin Loud MSN RN CNS WTA Entered By: [name] on 12/02/2021 10:54:39 -------------------------------------------------------------------------------- Multi Wound Chart Details Patient Name: Date of Service: Dustin Oxford. 12/02/2021 10:00 A M Medical Record Number:  [id number] Patient Account Number: [id number] Date of Birth/Sex: Treating RN: [birth year] (61 y.o. 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: Extremity Coronary Artery Disease, N/A N/A Comorbid History: Hypertension, Peripheral Venous Disease, Type II Diabetes 02/15/2020 N/A N/A Date Acquired: 3 N/A N/A Weeks of [name]: Open N/A N/A Wound Status: No N/A N/A Wound Recurrence: 0.5x0.3x0.5 N/A N/A Measurements [name] x W x D (cm) 0.118 N/A N/A A (cm) : rea 0.059 N/A N/A Volume (cm) : 65.90% N/A N/A % Reduction in A rea: 43.30% N/A N/A % Reduction in Volume: Grade 2 N/A N/A Classification: Large N/A N/A Exudate A mount: Sanguinous N/A N/A Exudate Type: red N/A N/A Exudate Color: Medium (34-66%) N/A N/A Granulation A mount: Pink N/A N/A Granulation Quality: Medium (34-66%) N/A N/A Necrotic A mount: Fat Layer (Subcutaneous Tissue): Yes N/A N/A Exposed Structures: Fascia: No Tendon: No Muscle: No Joint: No Bone: No None N/A N/A Epithelialization: [name] Notes Wound #1 (Toe Great) Wound Laterality: Plantar, Right Cleanser Wound Cleanser Discharge Instruction: Wash your hands with soap and water. Remove old dressing, discard into plastic bag and place into trash. Cleanse the wound with Wound Cleanser prior to applying a clean dressing using gauze sponges, not tissues or cotton balls. Do not scrub or  use excessive force. Pat dry using gauze sponges, not tissue or cotton balls. Peri-Wound Care Topical Primary Dressing Prisma 4.34 (in) Discharge Instruction: Moisten w/normal saline or sterile water; Cover wound as directed. Do not remove from wound bed. Secondary Dressing Gauze Secured With Medipore T - 61M Medipore H Soft Cloth Surgical T ape ape, 2x2 (in/yd) Compression Wrap Compression Stockings Add-Ons Electronic Signature(s) Signed: 12/02/2021 2:10:55 PM By: Dustin Loud MSN RN CNS WTA Entered By: [name] on 12/02/2021 10:56:33 [name], [name] ([id number]) [id number] Page 5 of 9 -------------------------------------------------------------------------------- Multi-Disciplinary Care Plan Details Patient Name: Date of  Service: [name], [name] 12/02/2021 10:00 A M Medical Record Number: [id number] Patient Account Number: [id number] Date of Birth/Sex: Treating RN: 10/18/1960 (61 y.o. [name] Primary Care Humbert Morozov: [name] Other Clinician: Referring Sulamita Lafountain: Treating Gargi Berch/Extender: [name]: 3 Active Inactive Necrotic Tissue Nursing Diagnoses: Impaired tissue integrity related to necrotic/devitalized tissue Knowledge deficit related to management of necrotic/devitalized tissue Goals: Necrotic/devitalized tissue will be minimized in the wound bed Date Initiated: 11/11/2021 Target Resolution Date: 11/11/2021 Goal Status: Active Patient/caregiver will verbalize understanding of reason and process for debridement of necrotic tissue Date Initiated: 11/11/2021 Date Inactivated: 12/02/2021 Target Resolution Date: 11/11/2021 Goal Status: Met Interventions: Assess patient pain level pre-, during and post procedure and prior to discharge Provide education on necrotic tissue and debridement process [name] Activities: Excisional debridement : 11/11/2021 Notes: Nutrition Nursing  Diagnoses: Potential for alteratiion in Nutrition/Potential for imbalanced nutrition Goals: Patient/caregiver verbalizes understanding of need to maintain therapeutic glucose control per primary care physician Date Initiated: 11/07/2021 Target Resolution Date: 12/08/2021 Goal Status: Active Interventions: Assess HgA1c results as ordered upon admission and as needed Assess patient nutrition upon admission and as needed per policy Provide education on elevated blood sugars and impact on wound healing Provide education on nutrition [name] Activities: Education provided on Nutrition : 12/02/2021 Notes: Wound/Skin Impairment Nursing Diagnoses: Knowledge deficit related to ulceration/compromised skin integrity Goals: Patient/caregiver will verbalize understanding of skin care regimen Date Initiated: 11/07/2021 Target Resolution Date: 12/08/2021 Goal Status: Active [name], [name] ([id number]) [id number] Page 6 of 9 Ulcer/skin breakdown will have a volume reduction of 30% by week 4 Date Initiated: 11/07/2021 Target Resolution Date: 01/07/2022 Goal Status: Active Ulcer/skin breakdown will have a volume reduction of 50% by week 8 Date Initiated: 11/07/2021 Target Resolution Date: 02/07/2022 Goal Status: Active Ulcer/skin breakdown will have a volume reduction of 80% by week 12 Date Initiated: 11/07/2021 Target Resolution Date: 03/10/2022 Goal Status: Active Ulcer/skin breakdown will heal within 14 weeks Date Initiated: 11/07/2021 Target Resolution Date: 04/15/2022 Goal Status: Active Interventions: Assess patient/caregiver ability to obtain necessary supplies Assess patient/caregiver ability to perform ulcer/skin care regimen upon admission and as needed Assess ulceration(s) every visit Notes: Electronic Signature(s) Signed: 12/02/2021 2:10:55 PM By: Dustin Loud MSN RN CNS WTA Entered By: [name] on 12/02/2021  10:56:23 -------------------------------------------------------------------------------- Pain Assessment Details Patient Name: Date of Service: Dustin Oxford. 12/02/2021 10:00 A M Medical Record Number: [id number] Patient Account Number: [id number] Date of Birth/Sex: Treating RN: 11/23/1960 (61 y.o. [name] Primary Care Axel Meas: [name] Other Clinician: Referring Kurk Corniel: Treating Shakala Marlatt/Extender: [name]: 3 Active Problems Location of Pain Severity and Description of Pain Patient Has Paino No Site Locations Pain Management and Medication Current Pain Management: [name], [name] ([id number]) [id number] Page 7 of 9 Electronic Signature(s) Signed: 12/02/2021 2:10:55 PM By: Dustin Loud MSN RN CNS WTA Entered By: [name] on 12/02/2021 10:32:39 -------------------------------------------------------------------------------- Patient/Caregiver Education Details Patient Name: Date of Service: Dustin Oxford 10/30/2023andnbsp10:00 A M Medical Record Number: [id number] Patient Account Number: [id number] Date of Birth/Gender: Treating RN: [birth year] (61 y.o. [name] Primary Care Physician: [name] Other Clinician: Referring Physician: Treating Physician/Extender: Massie Kluver in [name]: 3 Education Assessment Education Provided To: Patient Education Topics Provided Elevated Blood Sugar/ Impact on Healing: Handouts: Elevated Blood Sugars: How Do They Affect Wound Healing Methods: Explain/Verbal Responses: State content correctly Nutrition: Handouts: Elevated Blood Sugars: How Do They Affect Wound Healing Methods: Explain/Verbal Responses:  State content correctly Wound Debridement: Handouts: Wound Debridement Methods: Explain/Verbal Responses: State content correctly Electronic Signature(s) Signed: 12/02/2021 2:10:55 PM By: Dustin Loud  MSN RN CNS WTA Entered By: [name] on 12/02/2021 10:51:34 -------------------------------------------------------------------------------- Wound Assessment Details Patient Name: Date of Service: Dustin Oxford. 12/02/2021 10:00 A M Medical Record Number: [id number] Patient Account Number: [id number] Date of Birth/Sex: Treating RN: Sep 05, 1960 (61 y.o. [name] Primary Care Jayline Kilburg: [name] Other Clinician: Referring Nikko Quast: Treating Minetta Krisher/Extender: [name] [name], [name] ([id number]) [phone].pdf Page 8 of 9 Weeks in [name]: 3 Wound Status Wound Number: 1 Primary Diabetic Wound/Ulcer of the Lower Extremity Etiology: Wound Location: Right, Plantar T Great oe Wound Open Wounding Event: Gradually Appeared Status: Date Acquired: 02/15/2020 Comorbid Coronary Artery Disease, Hypertension, Peripheral Venous Weeks Of [name]: 3 History: Disease, Type II Diabetes Clustered Wound: No Photos Wound Measurements Length: (cm) 0.5 Width: (cm) 0.3 Depth: (cm) 0.5 Area: (cm) 0.118 Volume: (cm) 0.059 % Reduction in Area: 65.9% % Reduction in Volume: 43.3% Epithelialization: None Wound Description Classification: Grade 2 Exudate Amount: Large Exudate Type: Sanguinous Exudate Color: red Foul Odor After Cleansing: No Slough/Fibrino Yes Wound Bed Granulation Amount: Medium (34-66%) Exposed Structure Granulation Quality: Pink Fascia Exposed: No Necrotic Amount: Medium (34-66%) Fat Layer (Subcutaneous Tissue) Exposed: Yes Necrotic Quality: Adherent Slough Tendon Exposed: No Muscle Exposed: No Joint Exposed: No Bone Exposed: No [name] Notes Wound #1 (Toe Great) Wound Laterality: Plantar, Right Cleanser Wound Cleanser Discharge Instruction: Wash your hands with soap and water. Remove old dressing, discard into plastic bag and place into trash. Cleanse the wound with Wound Cleanser prior to applying a  clean dressing using gauze sponges, not tissues or cotton balls. Do not scrub or use excessive force. Pat dry using gauze sponges, not tissue or cotton balls. Peri-Wound Care Topical Primary Dressing Prisma 4.34 (in) Discharge Instruction: Moisten w/normal saline or sterile water; Cover wound as directed. Do not remove from wound bed. Secondary Dressing Gauze Secured With Medipore T - 34M Medipore H Soft Cloth Surgical T ape ape, 2x2 (in/yd) Compression Wrap Compression Stockings Add-Ons [name], [name] ([id number]) [id number] Page 9 of 9 Electronic Signature(s) Signed: 12/02/2021 2:10:55 PM By: Dustin Loud MSN RN CNS WTA Entered By: [name] on 12/02/2021 10:54:21 -------------------------------------------------------------------------------- Vitals Details Patient Name: Date of Service: Dustin Oxford. 12/02/2021 10:00 A M Medical Record Number: [id number] Patient Account Number: [id number] Date of Birth/Sex: Treating RN: 11-16-1960 (61 y.o. [name] Primary Care Rechel Delosreyes: [name] Other Clinician: Referring Leani Myron: Treating Eldridge Marcott/Extender: [name]: 3 Vital Signs Time Taken: 10:30 Temperature (F): 97.8 Height (in): 77 Pulse (bpm): 61 Weight (lbs): 262 Respiratory Rate (breaths/min): 18 Body Mass Index (BMI): 31.1 Blood Pressure (mmHg): 179/85 Reference Range: 80 - 120 mg / dl Electronic Signature(s) Signed: 12/02/2021 2:10:55 PM By: Dustin Loud MSN RN CNS WTA Entered By: [name] on 12/02/2021 10:32:29

## 2021-12-02 NOTE — Progress Notes (Addendum)
[name] ([id number]) [id number] Page 1 of 7 Visit Report for 12/02/2021 Chief Complaint Document Details Patient Name: Date of Service: [name], [name] 12/02/2021 10:00 A M Medical Record Number: [id number] Patient Account Number: [id number] Date of Birth/Sex: Treating RN: 1960/10/19 (62 y.o. [name] Primary Care Provider: Bernardo Heater Other Clinician: Referring Provider: Treating Provider/Extender: [name]: 3 Information Obtained from: Patient Chief Complaint Right great toe ulcer Electronic Signature(s) Signed: 12/02/2021 10:05:32 AM By: Worthy Keeler PA-C Entered By: Worthy Keeler on 12/02/2021 10:05:32 -------------------------------------------------------------------------------- Debridement Details Patient Name: Date of Service: [name] 12/02/2021 10:00 A M Medical Record Number: [id number] Patient Account Number: [id number] Date of Birth/Sex: Treating RN: 09/13/1960 (61 y.o. [name] Primary Care Provider: Bernardo Heater Other Clinician: Referring Provider: Treating Provider/Extender: [name]: 3 Debridement Performed for Assessment: Wound #1 Right,Plantar T Great oe Performed By: Physician Tommie Sams., PA-C Debridement Type: Debridement Severity of Tissue Pre Debridement: Fat layer exposed Level of Consciousness (Pre-procedure): Awake and Alert Pre-procedure Verification/Time Out Yes - 10:10 Taken: Start Time: 10:10 T Area Debrided ([name] x W): otal 0.5 (cm) x 0.3 (cm) = 0.15 (cm) Tissue and other material debrided: Viable, Non-Viable, Callus, Slough, Subcutaneous, Slough Level: Skin/Subcutaneous Tissue Debridement Description: Excisional Instrument: Curette Bleeding: Moderate Hemostasis Achieved: Silver Nitrate Response to Treatment: Procedure was tolerated well Level of Consciousness (Post- Awake and  Alert procedure): [name] ([id number]) [id number] Page 2 of 7 Post Debridement Measurements of Total Wound Length: (cm) 1 Width: (cm) 0.5 Depth: (cm) 0.5 Volume: (cm) 0.196 Character of Wound/Ulcer Post Debridement: Stable Severity of Tissue Post Debridement: Fat layer exposed Post Procedure Diagnosis Same as Pre-procedure Electronic Signature(s) Signed: 12/02/2021 2:10:55 PM By: Rosalio Loud MSN RN CNS WTA Signed: 12/02/2021 4:57:47 PM By: Worthy Keeler PA-C Entered By: Rosalio Loud on 12/02/2021 11:10:09 -------------------------------------------------------------------------------- HPI Details Patient Name: Date of Service: [name]. 12/02/2021 10:00 A M Medical Record Number: [id number] Patient Account Number: [id number] Date of Birth/Sex: Treating RN: 10-Apr-1960 (61 y.o. [name] Primary Care Provider: Bernardo Heater Other Clinician: Referring Provider: Treating Provider/Extender: [name]: 3 History of Present Illness HPI Description: 11-07-2021 upon evaluation today patient appears to be doing somewhat poorly in regard to a wound on his right plantar great toe. Fortunately there does not appear to be any evidence of active infection locally or systemically at this time which is great news. No fevers, chills, nausea, vomiting, or diarrhea. With that being said he tells me that he really has not been noticing a lot of improvement either with regard to the treatment. He has been under the care of Dr. Earleen Newport up to this point for roughly a year. More recently he did have an MRI which revealed the potential for possible osteomyelitis. With that being said I do believe that he definitely should probably be monitored for this but to be honest the Elesa Hacker that he was given probably did a good job. Considering there was not much being seen on MRI I do not think there is a high likelihood of  severe osteomyelitis at this point we will definitely keep an eye on things however. Nonetheless right now has been utilizing Silvadene cream I definitely think we can find something better to help get this moving in the right direction. Patient does have a history of diabetes mellitus type 2, chronic kidney disease stage III, hypertension,  and again he has had an amputation of the left leg currently. The right leg is the 1 that we are actually treating the great toe wound on. He has a below-knee amputation. 11-11-2021 upon evaluation today patient's wound actually is showing signs of excellent improvement. I am actually very pleased even compared to last week this is already better. He is very happy as well. 12-02-2021 upon evaluation today patient appears to be doing pretty well in regard to his toe ulcer this been several weeks since I saw him part of it was our scheduling with me being out of town and part of it was he actually missed an appointment secondary to his trunk the will actually fell off and he had some issues there getting it fixed on Monday so he missed his appointment last Monday. Nonetheless the good news is in today things appear to be doing well no signs of infection though he does have a pretty good need for sharp debridement to try to clear some of this away. Electronic Signature(s) Signed: 12/02/2021 10:59:57 AM By: Worthy Keeler PA-C Entered By: Worthy Keeler on 12/02/2021 10:59:57 [name], [name] ([id number]) [id number] Page 3 of 7 -------------------------------------------------------------------------------- Physical Exam Details Patient Name: Date of Service: [name], [name] 12/02/2021 10:00 A M Medical Record Number: [id number] Patient Account Number: [id number] Date of Birth/Sex: Treating RN: May 18, 1960 (61 y.o. [name] Primary Care Provider: Bernardo Heater Other Clinician: Referring Provider: Treating  Provider/Extender: [name]: 3 Constitutional Well-nourished and well-hydrated in no acute distress. Respiratory normal breathing without difficulty. Psychiatric this patient is able to make decisions and demonstrates good insight into disease process. Alert and Oriented x 3. pleasant and cooperative. Notes Upon inspection patient's wound bed actually showed signs of good granulation and epithelization at this point. Fortunately there does not appear to be any signs of infection which is great news and overall I am extremely pleased with where we stand. No fevers, chills, nausea, vomiting, or diarrhea. Electronic Signature(s) Signed: 12/02/2021 11:00:14 AM By: Worthy Keeler PA-C Entered By: Worthy Keeler on 12/02/2021 11:00:13 -------------------------------------------------------------------------------- Physician Orders Details Patient Name: Date of Service: [name] 12/02/2021 10:00 A M Medical Record Number: [id number] Patient Account Number: [id number] Date of Birth/Sex: Treating RN: August 29, 1960 (61 y.o. [name] Primary Care Provider: Bernardo Heater Other Clinician: Referring Provider: Treating Provider/Extender: [name]: 3 Verbal / Phone Orders: No Diagnosis Coding ICD-10 Coding Code Description E11.621 Type 2 diabetes mellitus with foot ulcer L97.512 Non-pressure chronic ulcer of other part of right foot with fat layer exposed N18.30 Chronic kidney disease, stage 3 unspecified I10 Essential (primary) hypertension Follow-up Appointments Return Appointment in 1 week. [name], [name] ([id number]) [id number] Page 4 of 7 Bathing/ Shower/ Hygiene May shower; gently cleanse wound with antibacterial soap, rinse and pat dry prior to dressing wounds No tub bath. Anesthetic (Use 'Patient Medications' Section for Anesthetic Order Entry) Lidocaine  applied to wound bed Edema Control - Lymphedema / Segmental Compressive Device / Other Elevate, Exercise Daily and A void Standing for Long Periods of Time. Elevate legs to the level of the heart and pump ankles as often as possible Elevate leg(s) parallel to the floor when sitting. Wound Treatment Wound #1 - T Great oe Wound Laterality: Plantar, Right Cleanser: Wound Cleanser 3 x Per Week Discharge Instructions: Wash your hands with soap and water. Remove old dressing, discard into plastic bag and place into trash.  Cleanse the wound with Wound Cleanser prior to applying a clean dressing using gauze sponges, not tissues or cotton balls. Do not scrub or use excessive force. Pat dry using gauze sponges, not tissue or cotton balls. Prim Dressing: Prisma 4.34 (in) 3 x Per Week ary Discharge Instructions: Moisten w/normal saline or sterile water; Cover wound as directed. Do not remove from wound bed. Secondary Dressing: Gauze 3 x Per Week Secured With: Medipore T - 15M Medipore H Soft Cloth Surgical T ape ape, 2x2 (in/yd) 3 x Per Week Electronic Signature(s) Signed: 12/02/2021 2:10:55 PM By: Rosalio Loud MSN RN CNS WTA Signed: 12/02/2021 4:57:47 PM By: Worthy Keeler PA-C Entered By: Rosalio Loud on 12/02/2021 10:50:50 -------------------------------------------------------------------------------- Problem List Details Patient Name: Date of Service: [name]. 12/02/2021 10:00 A M Medical Record Number: [id number] Patient Account Number: [id number] Date of Birth/Sex: Treating RN: 1960/12/04 (61 y.o. [name] Primary Care Provider: Bernardo Heater Other Clinician: Referring Provider: Treating Provider/Extender: [name]: 3 Active Problems ICD-10 Encounter Code Description Active Date MDM Diagnosis E11.621 Type 2 diabetes mellitus with foot ulcer 11/07/2021 No Yes L97.512 Non-pressure chronic ulcer of other part of right foot with  fat layer exposed 11/07/2021 No Yes N18.30 Chronic kidney disease, stage 3 unspecified 11/07/2021 No Yes I10 Essential (primary) hypertension 11/07/2021 No Yes [name], [name] ([id number]) [id number] Page 5 of 7 Inactive Problems Resolved Problems Electronic Signature(s) Signed: 12/02/2021 10:05:26 AM By: Worthy Keeler PA-C Entered By: Worthy Keeler on 12/02/2021 10:05:25 -------------------------------------------------------------------------------- Progress Note Details Patient Name: Date of Service: [name] 12/02/2021 10:00 A M Medical Record Number: [id number] Patient Account Number: [id number] Date of Birth/Sex: Treating RN: 01/01/1961 (61 y.o. [name] Primary Care Provider: Bernardo Heater Other Clinician: Referring Provider: Treating Provider/Extender: [name]: 3 Subjective Chief Complaint Information obtained from Patient Right great toe ulcer History of Present Illness (HPI) 11-07-2021 upon evaluation today patient appears to be doing somewhat poorly in regard to a wound on his right plantar great toe. Fortunately there does not appear to be any evidence of active infection locally or systemically at this time which is great news. No fevers, chills, nausea, vomiting, or diarrhea. With that being said he tells me that he really has not been noticing a lot of improvement either with regard to the treatment. He has been under the care of Dr. Earleen Newport up to this point for roughly a year. More recently he did have an MRI which revealed the potential for possible osteomyelitis. With that being said I do believe that he definitely should probably be monitored for this but to be honest the Elesa Hacker that he was given probably did a good job. Considering there was not much being seen on MRI I do not think there is a high likelihood of severe osteomyelitis at this point we will definitely keep an eye on  things however. Nonetheless right now has been utilizing Silvadene cream I definitely think we can find something better to help get this moving in the right direction. Patient does have a history of diabetes mellitus type 2, chronic kidney disease stage III, hypertension, and again he has had an amputation of the left leg currently. The right leg is the 1 that we are actually treating the great toe wound on. He has a below-knee amputation. 11-11-2021 upon evaluation today patient's wound actually is showing signs of excellent improvement. I am actually very pleased even compared  to last week this is already better. He is very happy as well. 12-02-2021 upon evaluation today patient appears to be doing pretty well in regard to his toe ulcer this been several weeks since I saw him part of it was our scheduling with me being out of town and part of it was he actually missed an appointment secondary to his trunk the will actually fell off and he had some issues there getting it fixed on Monday so he missed his appointment last Monday. Nonetheless the good news is in today things appear to be doing well no signs of infection though he does have a pretty good need for sharp debridement to try to clear some of this away. Objective Constitutional Well-nourished and well-hydrated in no acute distress. Vitals Time Taken: 10:30 AM, Height: 77 in, Weight: 262 lbs, BMI: 31.1, Temperature: 97.8 F, Pulse: 61 bpm, Respiratory Rate: 18 breaths/min, Blood Pressure: 179/85 mmHg. Respiratory normal breathing without difficulty. Psychiatric this patient is able to make decisions and demonstrates good insight into disease process. Alert and Oriented x 3. pleasant and cooperative. [name], [name] ([id number]) [id number] Page 6 of 7 General Notes: Upon inspection patient's wound bed actually showed signs of good granulation and epithelization at this point. Fortunately there does  not appear to be any signs of infection which is great news and overall I am extremely pleased with where we stand. No fevers, chills, nausea, vomiting, or diarrhea. Integumentary (Hair, Skin) Wound #1 status is Open. Original cause of wound was Gradually Appeared. The date acquired was: 02/15/2020. The wound has been in treatment 3 weeks. The wound is located on the [employer]. The wound measures 0.5cm length x 0.3cm width x 0.5cm depth; 0.118cm^2 area and 0.059cm^3 volume. oe There is Fat Layer (Subcutaneous Tissue) exposed. There is a large amount of sanguinous drainage noted. There is medium (34-66%) pink granulation within the wound bed. There is a medium (34-66%) amount of necrotic tissue within the wound bed including Adherent Slough. Assessment Active Problems ICD-10 Type 2 diabetes mellitus with foot ulcer Non-pressure chronic ulcer of other part of right foot with fat layer exposed Chronic kidney disease, stage 3 unspecified Essential (primary) hypertension Procedures Wound #1 Pre-procedure diagnosis of Wound #1 is a Diabetic Wound/Ulcer of the Lower Extremity located on the Right,Plantar T Great .Severity of Tissue Pre oe Debridement is: Fat layer exposed. There was a Excisional Skin/Subcutaneous Tissue Debridement with a total area of 0.15 sq cm performed by Tommie Sams., PA-C. With the following instrument(s): Curette to remove Viable and Non-Viable tissue/material. Material removed includes Callus, Subcutaneous Tissue, and Slough. No specimens were taken. A time out was conducted at 10:10, prior to the start of the procedure. A Moderate amount of bleeding was controlled with Silver Nitrate. The procedure was tolerated well. Post Debridement Measurements: 1cm length x 0.5cm width x 0.5cm depth; 0.196cm^3 volume. Character of Wound/Ulcer Post Debridement is stable. Severity of Tissue Post Debridement is: Fat layer exposed. Post procedure Diagnosis Wound #1: Same as  Pre-Procedure Plan Follow-up Appointments: Return Appointment in 1 week. Bathing/ Shower/ Hygiene: May shower; gently cleanse wound with antibacterial soap, rinse and pat dry prior to dressing wounds No tub bath. Anesthetic (Use 'Patient Medications' Section for Anesthetic Order Entry): Lidocaine applied to wound bed Edema Control - Lymphedema / Segmental Compressive Device / Other: Elevate, Exercise Daily and Avoid Standing for Long Periods of Time. Elevate legs to the level of the heart and pump ankles as often as possible Elevate  leg(s) parallel to the floor when sitting. WOUND #1: - T Great Wound Laterality: Plantar, Right oe Cleanser: Wound Cleanser 3 x Per Week/ Discharge Instructions: Wash your hands with soap and water. Remove old dressing, discard into plastic bag and place into trash. Cleanse the wound with Wound Cleanser prior to applying a clean dressing using gauze sponges, not tissues or cotton balls. Do not scrub or use excessive force. Pat dry using gauze sponges, not tissue or cotton balls. Prim Dressing: Prisma 4.34 (in) 3 x Per Week/ ary Discharge Instructions: Moisten w/normal saline or sterile water; Cover wound as directed. Do not remove from wound bed. Secondary Dressing: Gauze 3 x Per Week/ Secured With: Medipore T - 40M Medipore H Soft Cloth Surgical T ape ape, 2x2 (in/yd) 3 x Per Week/ 1. I am going to suggest that we have the patient continue to monitor for worsening infection. T oday I did perform a fairly extensive debridement clearway the callus he tolerated that well postdebridement this appears to be doing much better. 2. Recommend continue with the collagen currently which I think is doing a good job. 3. I am going to suggest that we have the patient continue with the gauze to cover and roll gauze to secure in place. 4. He should also continue to try and offload is much as possible. Obviously I know this is not an easy thing to do being that he does have a  prosthesis on the left leg and therefore with him having to drive we have not put him in a cast or boot on the right leg. Nonetheless he tells me that he is about done with his long work and that he will be able to stay off of this much more. We will see patient back for reevaluation in 1 week here in the clinic. If anything worsens or changes patient will contact our office for additional recommendations. Electronic Signature(s) Signed: 12/02/2021 11:11:39 AM By: Worthy Keeler PA-C Previous Signature: 12/02/2021 11:01:19 AM Version By: Irean Hong [name], [name] ([id number]) [id number] Page 7 of 7 Previous Signature: 12/02/2021 11:01:19 AM Version By: Worthy Keeler PA-C Entered By: Worthy Keeler on 12/02/2021 11:11:39 -------------------------------------------------------------------------------- SuperBill Details Patient Name: Date of Service: [name], [name] 12/02/2021 Medical Record Number: [id number] Patient Account Number: [id number] Date of Birth/Sex: Treating RN: May 04, 1960 (61 y.o. [name] Primary Care Provider: Bernardo Heater Other Clinician: Referring Provider: Treating Provider/Extender: [name]: 3 Diagnosis Coding ICD-10 Codes Code Description [phone] Type 2 diabetes mellitus with foot ulcer L97.512 Non-pressure chronic ulcer of other part of right foot with fat layer exposed N18.30 Chronic kidney disease, stage 3 unspecified I10 Essential (primary) hypertension Facility Procedures : CPT4 Code: [id number] Description: 70623 - DEB SUBQ TISSUE 20 SQ CM/< ICD-10 Diagnosis Description L97.512 Non-pressure chronic ulcer of other part of right foot with fat layer exposed Modifier: Quantity: 1 Physician Procedures : CPT4 Code Description Modifier [id number] - WC PHYS SUBQ TISS 20 SQ CM ICD-10 Diagnosis Description L97.512 Non-pressure chronic ulcer of other part of right foot  with fat layer exposed Quantity: 1 Electronic Signature(s) Signed: 12/02/2021 11:13:26 AM By: Worthy Keeler PA-C Entered By: Worthy Keeler on 12/02/2021 11:13:26

## 2021-12-09 ENCOUNTER — Encounter: Payer: HMO | Attending: Physician Assistant | Admitting: Internal Medicine

## 2021-12-09 DIAGNOSIS — E11621 Type 2 diabetes mellitus with foot ulcer: Secondary | ICD-10-CM | POA: Insufficient documentation

## 2021-12-09 DIAGNOSIS — N183 Chronic kidney disease, stage 3 unspecified: Secondary | ICD-10-CM | POA: Diagnosis not present

## 2021-12-09 DIAGNOSIS — E1122 Type 2 diabetes mellitus with diabetic chronic kidney disease: Secondary | ICD-10-CM | POA: Diagnosis not present

## 2021-12-09 DIAGNOSIS — L97512 Non-pressure chronic ulcer of other part of right foot with fat layer exposed: Secondary | ICD-10-CM | POA: Insufficient documentation

## 2021-12-09 DIAGNOSIS — I129 Hypertensive chronic kidney disease with stage 1 through stage 4 chronic kidney disease, or unspecified chronic kidney disease: Secondary | ICD-10-CM | POA: Diagnosis not present

## 2021-12-09 DIAGNOSIS — Z89512 Acquired absence of left leg below knee: Secondary | ICD-10-CM | POA: Insufficient documentation

## 2021-12-10 NOTE — Progress Notes (Signed)
[name] ([id number]) [id number] Page 1 of 7 Visit Report for 12/09/2021 Debridement Details Patient Name: Date of Service: [name], MOTTA 12/09/2021 10:15 A M Medical Record Number: [id number] Patient Account Number: [id number] Date of Birth/Sex: Treating RN: 1960-08-22 (61 y.o. [name]) Carlene Coria Primary Care Provider: Bernardo Heater Other Clinician: Referring Provider: Treating Provider/Extender: RO BSO N, MICHA EL Octavia Bruckner in Treatment: 4 Debridement Performed for Assessment: Wound #1 Right,Plantar T Great oe Performed By: Physician Ricard Dillon, MD Debridement Type: Debridement Severity of Tissue Pre Debridement: Fat layer exposed Level of Consciousness (Pre-procedure): Awake and Alert Pre-procedure Verification/Time Out Yes - 10:37 Taken: Start Time: 10:37 Level: Skin/Subcutaneous Tissue T Area Debrided (L x W): otal 1 (cm) x 1 (cm) = 1 (cm) Debridement Description: Excisional Instrument: Curette Bleeding: Minimum Hemostasis Achieved: Pressure End Time: 10:40 Procedural Pain: 0 Post Procedural Pain: 0 Response to Treatment: Procedure was tolerated well Level of Consciousness (Post- Awake and Alert procedure): Post Debridement Measurements of Total Wound Length: (cm) 0.4 Width: (cm) 0.8 Depth: (cm) 0.2 Volume: (cm) 0.05 Character of Wound/Ulcer Post Debridement: Improved Severity of Tissue Post Debridement: Fat layer exposed Post Procedure Diagnosis Same as Pre-procedure Electronic Signature(s) Signed: 12/09/2021 4:14:14 PM By: Linton Ham MD Signed: 12/10/2021 8:00:00 AM By: Carlene Coria RN Entered By: Carlene Coria on 12/09/2021 10:39:52 Hohman, Reynolds Bowl ([id number]) [id number] Page 2 of 7 -------------------------------------------------------------------------------- HPI Details Patient Name: Date of Service: [name] 12/09/2021 10:15 A M Medical Record  Number: [id number] Patient Account Number: [id number] Date of Birth/Sex: Treating RN: 03/03/1960 (61 y.o. [name]) Carlene Coria Primary Care Provider: Bernardo Heater Other Clinician: Referring Provider: Treating Provider/Extender: RO BSO N, [place] Woodson, Kristian Covey in Treatment: 4 History of Present Illness HPI Description: 11-07-2021 upon evaluation today patient appears to be doing somewhat poorly in regard to a wound on his right plantar great toe. Fortunately there does not appear to be any evidence of active infection locally or systemically at this time which is great news. No fevers, chills, nausea, vomiting, or diarrhea. With that being said he tells me that he really has not been noticing a lot of improvement either with regard to the treatment. He has been under the care of Dr. Earleen Newport up to this point for roughly a year. More recently he did have an MRI which revealed the potential for possible osteomyelitis. With that being said I do believe that he definitely should probably be monitored for this but to be honest the Elesa Hacker that he was given probably did a good job. Considering there was not much being seen on MRI I do not think there is a high likelihood of severe osteomyelitis at this point we will definitely keep an eye on things however. Nonetheless right now has been utilizing Silvadene cream I definitely think we can find something better to help get this moving in the right direction. Patient does have a history of diabetes mellitus type 2, chronic kidney disease stage III, hypertension, and again he has had an amputation of the left leg currently. The right leg is the 1 that we are actually treating the great toe wound on. He has a below-knee amputation. 11-11-2021 upon evaluation today patient's wound actually is showing signs of excellent improvement. I am actually very pleased even compared to last week this is already better. He is very happy as well. 12-02-2021 upon  evaluation today patient appears to be doing pretty well in regard to his toe ulcer  this been several weeks since I saw him part of it was our scheduling with me being out of town and part of it was he actually missed an appointment secondary to his trunk the will actually fell off and he had some issues there getting it fixed on Monday so he missed his appointment last Monday. Nonetheless the good news is in today things appear to be doing well no signs of infection though he does have a pretty good need for sharp debridement to try to clear some of this away. 11/6; right first toes ulcer complicated by his left-sided BKA.Marland KitchenNo major change from last visit Electronic Signature(s) Signed: 12/09/2021 4:14:14 PM By: Linton Ham MD Entered By: Linton Ham on 12/09/2021 11:20:57 -------------------------------------------------------------------------------- Physical Exam Details Patient Name: Date of Service: [name] 12/09/2021 10:15 A M Medical Record Number: [id number] Patient Account Number: [id number] Date of Birth/Sex: Treating RN: 1960-03-22 (61 y.o. Oval Linsey Primary Care Provider: Bernardo Heater Other Clinician: Referring Provider: Treating Provider/Extender: RO BSO N, MICHA EL Sheppard Plumber Weeks in Treatment: 4 Constitutional Patient is hypertensive.. Pulse regular and within target range for patient.[name] Respirations regular, non-labored and within target range.. Temperature is normal and within the target range for the patient.[name] appears in no distress. Notes Wound exam; small area on the medial aspect of the right first toe. Again thick skin and callus around the edges removed with a #3 curette. No evidence of infection. This does not probe deeply Electronic Signature(s) Signed: 12/09/2021 4:14:14 PM By: Linton Ham MD Entered By: Linton Ham on 12/09/2021 11:22:16 Beier, Reynolds Bowl ([id number]) [id number] Page 3 of  7 -------------------------------------------------------------------------------- Physician Orders Details Patient Name: Date of Service: SEQUOIA, MINCEY 12/09/2021 10:15 A M Medical Record Number: [id number] Patient Account Number: [id number] Date of Birth/Sex: Treating RN: 01-28-1961 (61 y.o. [name]) Carlene Coria Primary Care Provider: Bernardo Heater Other Clinician: Referring Provider: Treating Provider/Extender: RO BSO N, MICHA EL Octavia Bruckner in Treatment: 4 Verbal / Phone Orders: No Diagnosis Coding Follow-up Appointments Return Appointment in 1 week. Bathing/ Shower/ Hygiene May shower; gently cleanse wound with antibacterial soap, rinse and pat dry prior to dressing wounds No tub bath. Anesthetic (Use 'Patient Medications' Section for Anesthetic Order Entry) Lidocaine applied to wound bed Edema Control - Lymphedema / Segmental Compressive Device / Other Elevate, Exercise Daily and A void Standing for Long Periods of Time. Elevate legs to the level of the heart and pump ankles as often as possible Elevate leg(s) parallel to the floor when sitting. Off-Loading Offloading felt to foot. - right great toe Wound Treatment Wound #1 - T Great oe Wound Laterality: Plantar, Right Cleanser: Wound Cleanser 3 x Per Week Discharge Instructions: Wash your hands with soap and water. Remove old dressing, discard into plastic bag and place into trash. Cleanse the wound with Wound Cleanser prior to applying a clean dressing using gauze sponges, not tissues or cotton balls. Do not scrub or use excessive force. Pat dry using gauze sponges, not tissue or cotton balls. Prim Dressing: Prisma 4.34 (in) 3 x Per Week ary Discharge Instructions: Moisten w/normal saline or sterile water; Cover wound as directed. Do not remove from wound bed. Secondary Dressing: Gauze 3 x Per Week Secured With: Medipore T - 63M Medipore H Soft Cloth Surgical T ape ape, 2x2 (in/yd) 3 x Per Week Electronic  Signature(s) Signed: 12/09/2021 4:14:14 PM By: Linton Ham MD Signed: 12/10/2021 8:00:00 AM By: Carlene Coria RN Entered By: Carlene Coria on 12/09/2021  10:40:25 SMITH, POTENZA ([id number]) [id number] Page 4 of 7 -------------------------------------------------------------------------------- Problem List Details Patient Name: Date of Service: JERONIMO, HELLBERG 12/09/2021 10:15 A M Medical Record Number: [id number] Patient Account Number: [id number] Date of Birth/Sex: Treating RN: 03-26-1960 (61 y.o. [name]) Carlene Coria Primary Care Provider: Bernardo Heater Other Clinician: Referring Provider: Treating Provider/Extender: RO BSO N, [place] Woodson, Kristian Covey in Treatment: 4 Active Problems ICD-10 Encounter Code Description Active Date MDM Diagnosis E11.621 Type 2 diabetes mellitus with foot ulcer 11/07/2021 No Yes L97.512 Non-pressure chronic ulcer of other part of right foot with fat layer exposed 11/07/2021 No Yes N18.30 Chronic kidney disease, stage 3 unspecified 11/07/2021 No Yes I10 Essential (primary) hypertension 11/07/2021 No Yes Inactive Problems Resolved Problems Electronic Signature(s) Signed: 12/09/2021 4:14:14 PM By: Linton Ham MD Entered By: Linton Ham on 12/09/2021 11:19:43 -------------------------------------------------------------------------------- Progress Note Details Patient Name: Date of Service: [name]. 12/09/2021 10:15 A M Medical Record Number: [id number] Patient Account Number: [id number] Date of Birth/Sex: Treating RN: 1960-07-26 (61 y.o. Oval Linsey Primary Care Provider: Bernardo Heater Other Clinician: Referring Provider: Treating Provider/Extender: RO BSO N, [place] EL Octavia Bruckner in Treatment: 4 Subjective History of Present Illness (HPI) 11-07-2021 upon evaluation today patient appears to be doing somewhat poorly in regard to a wound on his right plantar great toe. Fortunately  there does not appear to be any evidence of active infection locally or systemically at this time which is great news. No fevers, chills, nausea, vomiting, or diarrhea. With that being said he tells me that he really has not been noticing a lot of improvement either with regard to the treatment. He has been under the care of Dr. Earleen Newport up to this point for roughly a year. More recently he did have an MRI which revealed the potential for possible osteomyelitis. With that being said I do believe that he definitely should probably be monitored for this but to be honest the Elesa Hacker that he was given probably did a good job. Considering there was not much being seen on MRI I do not think there is a high likelihood of severe osteomyelitis at this point we will definitely keep an eye on things however. Nonetheless right now has been utilizing Silvadene cream I definitely think we can find something better to help get this moving in the right direction. Patient does have a history of diabetes mellitus type 2, chronic kidney disease stage III, hypertension, and again he has had an amputation of the left leg currently. The right leg is the 1 that we are actually treating the great toe wound on. He has a below-knee amputation. DAVIUS, GOUDEAU ([id number]) [id number] Page 5 of 7 11-11-2021 upon evaluation today patient's wound actually is showing signs of excellent improvement. I am actually very pleased even compared to last week this is already better. He is very happy as well. 12-02-2021 upon evaluation today patient appears to be doing pretty well in regard to his toe ulcer this been several weeks since I saw him part of it was our scheduling with me being out of town and part of it was he actually missed an appointment secondary to his trunk the will actually fell off and he had some issues there getting it fixed on Monday so he missed his appointment last Monday. Nonetheless the  good news is in today things appear to be doing well no signs of infection though he does have a pretty good need for sharp debridement  to try to clear some of this away. 11/6; right first toes ulcer complicated by his left-sided BKA.Marland KitchenNo major change from last visit Objective Constitutional Patient is hypertensive.. Pulse regular and within target range for patient.[name] Respirations regular, non-labored and within target range.. Temperature is normal and within the target range for the patient.[name] appears in no distress. Vitals Time Taken: 10:22 AM, Height: 77 in, Weight: 262 lbs, BMI: 31.1, Temperature: 97.5 F, Pulse: 56 bpm, Respiratory Rate: 16 breaths/min, Blood Pressure: 174/61 mmHg. General Notes: Wound exam; small area on the medial aspect of the right first toe. Again thick skin and callus around the edges removed with a #3 curette. No evidence of infection. This does not probe deeply Integumentary (Hair, Skin) Wound #1 status is Open. Original cause of wound was Gradually Appeared. The date acquired was: 02/15/2020. The wound has been in treatment 4 weeks. The wound is located on the [employer]. The wound measures 0.4cm length x 0.8cm width x 0.2cm depth; 0.251cm^2 area and 0.05cm^3 volume. There oe is Fat Layer (Subcutaneous Tissue) exposed. There is no tunneling or undermining noted. There is a medium amount of serosanguineous drainage noted. There is medium (34-66%) pink granulation within the wound bed. There is a medium (34-66%) amount of necrotic tissue within the wound bed including Adherent Slough. Assessment Active Problems ICD-10 Type 2 diabetes mellitus with foot ulcer Non-pressure chronic ulcer of other part of right foot with fat layer exposed Chronic kidney disease, stage 3 unspecified Essential (primary) hypertension Procedures Wound #1 Pre-procedure diagnosis of Wound #1 is a Diabetic Wound/Ulcer of the Lower Extremity located on the Right,Plantar T Great  .Severity of Tissue Pre oe Debridement is: Fat layer exposed. There was a Excisional Skin/Subcutaneous Tissue Debridement ([id number]) with a total area of 1 sq cm performed by Ricard Dillon, MD. With the following instrument(s): Curette to remove Viable and Non-Viable tissue/material. Material removed includes Subcutaneous Tissue, Skin: Dermis, and Skin: Epidermis. A time out was conducted at 10:37, prior to the start of the procedure. A Minimum amount of bleeding was controlled with Pressure. The procedure was tolerated well with a pain level of 0 throughout and a pain level of 0 following the procedure. Post Debridement Measurements: 0.4cm length x 0.8cm width x 0.2cm depth; 0.05cm^3 volume. Character of Wound/Ulcer Post Debridement is improved. Severity of Tissue Post Debridement is: Fat layer exposed. Post procedure Diagnosis Wound #1: Same as Pre-Procedure Plan Follow-up Appointments: Return Appointment in 1 week. Bathing/ Shower/ Hygiene: May shower; gently cleanse wound with antibacterial soap, rinse and pat dry prior to dressing wounds No tub bath. Anesthetic (Use 'Patient Medications' Section for Anesthetic Order Entry): Lidocaine applied to wound bed Edema Control - Lymphedema / Segmental Compressive Device / Other: Elevate, Exercise Daily and Avoid Standing for Long Periods of Time. Elevate legs to the level of the heart and pump ankles as often as possible Elevate leg(s) parallel to the floor when sitting. ARCHER, MOIST ([id number]) [id number] Page 6 of 7 Off-Loading: Offloading felt to foot. - right great toe WOUND #1: - T Great Wound Laterality: Plantar, Right oe Cleanser: Wound Cleanser 3 x Per Week/ Discharge Instructions: Wash your hands with soap and water. Remove old dressing, discard into plastic bag and place into trash. Cleanse the wound with Wound Cleanser prior to applying a clean dressing using gauze sponges, not tissues or  cotton balls. Do not scrub or use excessive force. Pat dry using gauze sponges, not tissue or cotton balls. Prim Dressing: Prisma 4.34 (  in) 3 x Per Week/ ary Discharge Instructions: Moisten w/normal saline or sterile water; Cover wound as directed. Do not remove from wound bed. Secondary Dressing: Gauze 3 x Per Week/ Secured With: Medipore T - 37M Medipore H Soft Cloth Surgical T ape ape, 2x2 (in/yd) 3 x Per Week/ 1. Continuing with Prisma. Again my debridement is limited to trying to obtain a viable skin circumference without callus. 2. 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 I am not certain that his schedule will allow a total contact cast. Otherwise he is in a diabetic shoe which is the mate of the running shoe on his left foot. 3. We are going to try to construct a felt donut to put additional padding on this area Electronic Signature(s) Signed: 12/09/2021 4:14:14 PM By: Linton Ham MD Entered By: Linton Ham on 12/09/2021 11:32:33 -------------------------------------------------------------------------------- SuperBill Details Patient Name: Date of Service: [name] 12/09/2021 Medical Record Number: [id number] Patient Account Number: [id number] Date of Birth/Sex: Treating RN: [birth year] (61 y.o. Oval Linsey Primary Care Provider: Bernardo Heater Other Clinician: Referring Provider: Treating Provider/Extender: RO BSO N, MICHA EL Octavia Bruckner in Treatment: 4 Diagnosis Coding ICD-10 Codes Code Description E11.621 Type 2 diabetes mellitus with foot ulcer L97.512 Non-pressure chronic ulcer of other part of right foot with fat layer exposed N18.30 Chronic kidney disease, stage 3 unspecified I10 Essential (primary) hypertension Facility Procedures : CPT4 Code: [id number] Description: 22482 - DEB SUBQ TISSUE 20 SQ CM/< ICD-10 Diagnosis  Description L97.512 Non-pressure chronic ulcer of other part of right foot with fat layer exposed E11.621 Type 2 diabetes mellitus with foot ulcer Modifier: Quantity: 1 Physician Procedures : CPT4 Code Description Modifier [id number] - WC PHYS SUBQ TISS 20 SQ CM ICD-10 Diagnosis Description L97.512 Non-pressure chronic ulcer of other part of right foot with fat layer exposed E11.621 Type 2 diabetes mellitus with foot ulcer Lupu,  Rockwell L ([id number]) [id number] Page 7 o Quantity: 1 f 7 Electronic Signature(s) Signed: 12/09/2021 4:14:14 PM By: Linton Ham MD Entered By: Linton Ham on 12/09/2021 11:24:00

## 2021-12-10 NOTE — Progress Notes (Signed)
ROLEN, CONGER ([id number]) [id number] Page 1 of 9 Visit Report for 12/09/2021 Arrival Information Details Patient Name: Date of Service: [name], [name] 12/09/2021 10:15 A M Medical Record Number: [id number] Patient Account Number: [id number] Date of Birth/Sex: Treating RN: [birth year] (61 y.o. Jerilynn Mages) Carlene Coria Primary Care Brynden Thune: Bernardo Heater Other Clinician: Referring Tynan Boesel: Treating Amberlea Spagnuolo/Extender: RO BSO N, MICHA EL Octavia Bruckner in Treatment: 4 Visit Information History Since Last Visit All ordered tests and consults were completed: No Patient Arrived: Ambulatory Added or deleted any medications: No Arrival Time: 10:20 Any new allergies or adverse reactions: No Accompanied By: self Had a fall or experienced change in No Transfer Assistance: None activities of daily living that may affect Patient Identification Verified: Yes risk of falls: Secondary Verification Process Completed: Yes Signs or symptoms of abuse/neglect since last visito No Patient Requires Transmission-Based Precautions: No Hospitalized since last visit: No Patient Has Alerts: Yes Implantable device outside of the clinic excluding No Patient Alerts: 09/05/21 right 1.26 cellular tissue based products placed in the center 09/05/21 left 1.10 since last visit: Has Dressing in Place as Prescribed: Yes Pain Present Now: No Electronic Signature(s) Signed: 12/10/2021 8:00:00 AM By: Carlene Coria RN Entered By: Carlene Coria on 12/09/2021 10:22:51 -------------------------------------------------------------------------------- Clinic Level of Care Assessment Details Patient Name: Date of Service: [name], [name] 12/09/2021 10:15 A M Medical Record Number: [id number] Patient Account Number: [id number] Date of Birth/Sex: Treating RN: July 06, 1960 (61 y.o. Jerilynn Mages) Carlene Coria Primary Care Zae Kirtz: Bernardo Heater Other Clinician: Referring Konner Saiz: Treating  Mykell Rawl/Extender: RO BSO N, MICHA EL Octavia Bruckner in Treatment: 4 Clinic Level of Care Assessment Items TOOL 1 Quantity Score _0  - 0 Use when EandM and Procedure is performed on INITIAL visit ASSESSMENTS - Nursing Assessment / Reassessment _1  - 0 General Physical Exam (combine w/ comprehensive assessment (listed just below) when performed on new pt. evals) _2  - 0 Comprehensive Assessment (HX, ROS, Risk Assessments, Wounds Hx, etc.) Schauer, Feliz L ([id number]) [id number] Page 2 of 9 ASSESSMENTS - Wound and Skin Assessment / Reassessment _3  - 0 Dermatologic / Skin Assessment (not related to wound area) ASSESSMENTS - Ostomy and/or Continence Assessment and Care _4  - 0 Incontinence Assessment and Management _5  - 0 Ostomy Care Assessment and Management (repouching, etc.) PROCESS - Coordination of Care _6  - 0 Simple Patient / Family Education for ongoing care _7  - 0 Complex (extensive) Patient / Family Education for ongoing care _8  - 0 Staff obtains Programmer, systems, Records, T Results / Process Orders est _9  - 0 Staff telephones HHA, Nursing Homes / Clarify orders / etc _10  - 0 Routine Transfer to another Facility (non-emergent condition) _11  - 0 Routine Hospital Admission (non-emergent condition) _12  - 0 New Admissions / Biomedical engineer / Ordering NPWT Apligraf, etc. , _13  - 0 Emergency Hospital Admission (emergent condition) PROCESS - Special Needs _14  - 0 Pediatric / Minor Patient Management _15  - 0 Isolation Patient Management _16  - 0 Hearing / Language / Visual special needs _17  - 0 Assessment of Community assistance (transportation, D/C planning, etc.) _18  - 0 Additional assistance / Altered mentation _19  - 0 Support Surface(s) Assessment (bed, cushion, seat, etc.) INTERVENTIONS - Miscellaneous _20  - 0 External ear exam _21  - 0 Patient Transfer (multiple staff / Civil Service fast streamer / Similar devices) _22  - 0 Simple Staple / Suture  removal (25 or less) _23  - 0 Complex Staple / Suture removal (26 or more) _24  - 0 Hypo/Hyperglycemic Management (do not check if billed separately) _25  -  0 Ankle / Brachial Index (ABI) - do not check if billed separately Has the patient been seen at the hospital within the last three years: Yes Total Score: 0 Level Of Care: ____ Electronic Signature(s) Signed: 12/10/2021 8:00:00 AM By: Carlene Coria RN Entered By: Carlene Coria on 12/09/2021 10:40:31 -------------------------------------------------------------------------------- Encounter Discharge Information Details Patient Name: Date of Service: Dustin Oxford. 12/09/2021 10:15 A M Medical Record Number: [id number] Patient Account Number: [id number] Date of Birth/Sex: Treating RN: 09/09/1960 (61 y.o. Oval Linsey Primary Care Candas Deemer: Bernardo Heater Other Clinician: Referring Claribel Sachs: Treating Denee Boeder/Extender: Eldridge Dace, [place], [place], [place] ([id number]) [phone].pdf Page 3 of 9 Weeks in Treatment: 4 Encounter Discharge Information Items Post Procedure Vitals Discharge Condition: Stable Temperature (F): 97.5 Ambulatory Status: Ambulatory Pulse (bpm): 56 Discharge Destination: Home Respiratory Rate (breaths/min): 16 Transportation: Private Auto Blood Pressure (mmHg): 174/61 Accompanied By: self Schedule Follow-up Appointment: Yes Clinical Summary of Care: Electronic Signature(s) Signed: 12/10/2021 8:00:00 AM By: Carlene Coria RN Entered By: Carlene Coria on 12/09/2021 10:41:27 -------------------------------------------------------------------------------- Lower Extremity Assessment Details Patient Name: Date of Service: [name], [name] 12/09/2021 10:15 A M Medical Record Number: [id number] Patient Account Number: [id number] Date of Birth/Sex: Treating RN: [birth year] (61 y.o. Jerilynn Mages) Carlene Coria Primary Care Charrise Lardner: Bernardo Heater Other Clinician: Referring  Larren Copes: Treating Jefferey Lippmann/Extender: RO BSO N, [place], [place] Weeks in Treatment: 4 Electronic Signature(s) Signed: 12/10/2021 8:00:00 AM By: Carlene Coria RN Entered By: Carlene Coria on 12/09/2021 10:26:37 -------------------------------------------------------------------------------- Multi Wound Chart Details Patient Name: Date of Service: Dustin Oxford. 12/09/2021 10:15 A M Medical Record Number: [id number] Patient Account Number: [id number] Date of Birth/Sex: Treating RN: 1960/10/29 (61 y.o. Oval Linsey Primary Care Dhruvan Gullion: Bernardo Heater Other Clinician: Referring Gerianne Simonet: Treating Muhsin Doris/Extender: RO BSO N, [address] Wood[place], [place] Weeks in Treatment: 4 Vital Signs Height(in): 77 Pulse(bpm): 56 Weight(lbs): 262 Blood Pressure(mmHg): 174/61 Body Mass Index(BMI): 31.1 Temperature(F): 97.5 Respiratory Rate(breaths/min): 16 [Newman, Selestino L ([id number]):Photos:] [[id number].pdf Page 4 of 9:1 N/A N/A N/A N/A] Right, Plantar T Great oe N/A N/A Wound Location: Gradually Appeared N/A N/A Wounding Event: Diabetic Wound/Ulcer of the Lower N/A N/A Primary Etiology: Extremity Coronary Artery Disease, N/A N/A Comorbid History: Hypertension, Peripheral Venous Disease, Type II Diabetes 02/15/2020 N/A N/A Date Acquired: 4 N/A N/A Weeks of Treatment: Open N/A N/A Wound Status: No N/A N/A Wound Recurrence: 0.4x0.8x0.2 N/A N/A Measurements L x W x D (cm) 0.251 N/A N/A A (cm) : rea 0.05 N/A N/A Volume (cm) : 27.50% N/A N/A % Reduction in A rea: 51.90% N/A N/A % Reduction in Volume: Grade 2 N/A N/A Classification: Medium N/A N/A Exudate A mount: Serosanguineous N/A N/A Exudate Type: red, brown N/A N/A Exudate Color: Medium (34-66%) N/A N/A Granulation A mount: Pink N/A N/A Granulation Quality: Medium (34-66%) N/A N/A Necrotic A mount: Fat Layer (Subcutaneous Tissue): Yes N/A N/A Exposed  Structures: Fascia: No Tendon: No Muscle: No Joint: No Bone: No None N/A N/A Epithelialization: Treatment Notes Electronic Signature(s) Signed: 12/10/2021 8:00:00 AM By: Carlene Coria RN Entered By: Carlene Coria on 12/09/2021 10:26:53 -------------------------------------------------------------------------------- Multi-Disciplinary Care Plan Details Patient Name: Date of Service: Dustin Oxford. 12/09/2021 10:15 A M Medical Record Number: [id number] Patient Account Number: [id number] Date of Birth/Sex: Treating RN: Jan 02, 1961 (61 y.o. Oval Linsey Primary Care Sheetal Lyall: Bernardo Heater Other Clinician: Referring Monterius Rolf: Treating Daily Doe/Extender: RO BSO N, [place] EL Octavia Bruckner in Treatment: 4 Active Inactive Necrotic Tissue Nursing Diagnoses:  Impaired tissue integrity related to necrotic/devitalized tissue Miguez, Choya L ([id number]) [id number] Page 5 of 9 Knowledge deficit related to management of necrotic/devitalized tissue Goals: Necrotic/devitalized tissue will be minimized in the wound bed Date Initiated: 11/11/2021 Target Resolution Date: 11/11/2021 Goal Status: Active Patient/caregiver will verbalize understanding of reason and process for debridement of necrotic tissue Date Initiated: 11/11/2021 Date Inactivated: 12/02/2021 Target Resolution Date: 11/11/2021 Goal Status: Met Interventions: Assess patient pain level pre-, during and post procedure and prior to discharge Provide education on necrotic tissue and debridement process Treatment Activities: Excisional debridement : 11/11/2021 Notes: Nutrition Nursing Diagnoses: Potential for alteratiion in Nutrition/Potential for imbalanced nutrition Goals: Patient/caregiver verbalizes understanding of need to maintain therapeutic glucose control per primary care physician Date Initiated: 11/07/2021 Target Resolution Date: 12/08/2021 Goal Status:  Active Interventions: Assess HgA1c results as ordered upon admission and as needed Assess patient nutrition upon admission and as needed per policy Provide education on elevated blood sugars and impact on wound healing Provide education on nutrition Treatment Activities: Education provided on Nutrition : 11/07/2021 Notes: Wound/Skin Impairment Nursing Diagnoses: Knowledge deficit related to ulceration/compromised skin integrity Goals: Patient/caregiver will verbalize understanding of skin care regimen Date Initiated: 11/07/2021 Target Resolution Date: 12/08/2021 Goal Status: Active Ulcer/skin breakdown will have a volume reduction of 30% by week 4 Date Initiated: 11/07/2021 Target Resolution Date: 01/07/2022 Goal Status: Active Ulcer/skin breakdown will have a volume reduction of 50% by week 8 Date Initiated: 11/07/2021 Target Resolution Date: 02/07/2022 Goal Status: Active Ulcer/skin breakdown will have a volume reduction of 80% by week 12 Date Initiated: 11/07/2021 Target Resolution Date: 03/10/2022 Goal Status: Active Ulcer/skin breakdown will heal within 14 weeks Date Initiated: 11/07/2021 Target Resolution Date: 04/15/2022 Goal Status: Active Interventions: Assess patient/caregiver ability to obtain necessary supplies Assess patient/caregiver ability to perform ulcer/skin care regimen upon admission and as needed Assess ulceration(s) every visit Notes: Electronic Signature(s) Signed: 12/10/2021 8:00:00 AM By: Carlene Coria RN Entered By: Carlene Coria on 12/09/2021 10:26:46 Polimeni, Reynolds Bowl ([id number]) [id number] Page 6 of 9 -------------------------------------------------------------------------------- Pain Assessment Details Patient Name: Date of Service: [name], [name] 12/09/2021 10:15 A M Medical Record Number: [id number] Patient Account Number: [id number] Date of Birth/Sex: Treating RN: 24-May-1960 (61 y.o. Jerilynn Mages) Carlene Coria Primary Care  Neiko Trivedi: Bernardo Heater Other Clinician: Referring Erial Fikes: Treating Taylin Mans/Extender: RO BSO N, [place] Woodson, Taylor Weeks in Treatment: 4 Active Problems Location of Pain Severity and Description of Pain Patient Has Paino No Site Locations Pain Management and Medication Current Pain Management: Electronic Signature(s) Signed: 12/10/2021 8:00:00 AM By: Carlene Coria RN Entered By: Carlene Coria on 12/09/2021 10:23:19 -------------------------------------------------------------------------------- Patient/Caregiver Education Details Patient Name: Date of Service: Dustin Oxford 11/6/2023andnbsp10:15 Wall Record Number: [id number] Patient Account Number: [id number] Date of Birth/Gender: Treating RN: 1960/09/05 (61 y.o. Oval Linsey Primary Care Physician: Bernardo Heater Other Clinician: Referring Physician: Treating Physician/Extender: Eldridge Dace, MICHA EL Octavia Bruckner in Treatment: [place], [place] ([id number]) [id number] Page 7 of 9 Education Assessment Education Provided To: Patient Education Topics Provided Elevated Blood Sugar/ Impact on Healing: Methods: Explain/Verbal Responses: State content correctly Electronic Signature(s) Signed: 12/10/2021 8:00:00 AM By: Carlene Coria RN Entered By: Carlene Coria on 12/09/2021 10:40:45 -------------------------------------------------------------------------------- Wound Assessment Details Patient Name: Date of Service: Dustin Oxford 12/09/2021 10:15 A M Medical Record Number: [id number] Patient Account Number: [id number] Date of Birth/Sex: Treating RN: 1960-07-19 (61 y.o. Oval Linsey Primary Care Hargun Spurling: Bernardo Heater Other Clinician: Referring Muriel Wilber: Treating Geraline Halberstadt/Extender: RO BSO  N, MICHA EL G Wood[place], [place] Weeks in Treatment: 4 Wound Status Wound Number: 1 Primary Diabetic Wound/Ulcer of the Lower Extremity Etiology: Wound Location: Right,  Plantar T Great oe Wound Open Wounding Event: Gradually Appeared Status: Date Acquired: 02/15/2020 Comorbid Coronary Artery Disease, Hypertension, Peripheral Venous Weeks Of Treatment: 4 History: Disease, Type II Diabetes Clustered Wound: No Photos Wound Measurements Length: (cm) 0.4 Width: (cm) 0.8 Depth: (cm) 0.2 Area: (cm) 0.251 Volume: (cm) 0.05 % Reduction in Area: 27.5% % Reduction in Volume: 51.9% Epithelialization: None Tunneling: No Undermining: No Wound Description Classification: Grade 2 Exudate Amount: Medium Exudate Type: Serosanguineous Exudate Color: red, brown Mofield, Gustaf L ([id number]) Foul Odor After Cleansing: No Slough/Fibrino Yes [id number] Page 8 of 9 Wound Bed Granulation Amount: Medium (34-66%) Exposed Structure Granulation Quality: Pink Fascia Exposed: No Necrotic Amount: Medium (34-66%) Fat Layer (Subcutaneous Tissue) Exposed: Yes Necrotic Quality: Adherent Slough Tendon Exposed: No Muscle Exposed: No Joint Exposed: No Bone Exposed: No Treatment Notes Wound #1 (Toe Great) Wound Laterality: Plantar, Right Cleanser Wound Cleanser Discharge Instruction: Wash your hands with soap and water. Remove old dressing, discard into plastic bag and place into trash. Cleanse the wound with Wound Cleanser prior to applying a clean dressing using gauze sponges, not tissues or cotton balls. Do not scrub or use excessive force. Pat dry using gauze sponges, not tissue or cotton balls. Peri-Wound Care Topical Primary Dressing Prisma 4.34 (in) Discharge Instruction: Moisten w/normal saline or sterile water; Cover wound as directed. Do not remove from wound bed. Secondary Dressing Gauze Secured With Medipore T - 87M Medipore H Soft Cloth Surgical T ape ape, 2x2 (in/yd) Compression Wrap Compression Stockings Add-Ons Electronic Signature(s) Signed: 12/10/2021 8:00:00 AM By: Carlene Coria RN Entered By: Carlene Coria on  12/09/2021 10:26:25 -------------------------------------------------------------------------------- Vitals Details Patient Name: Date of Service: Dustin Oxford. 12/09/2021 10:15 A M Medical Record Number: [id number] Patient Account Number: [id number] Date of Birth/Sex: Treating RN: 11-04-1960 (61 y.o. Jerilynn Mages) Carlene Coria Primary Care Damyah Gugel: Bernardo Heater Other Clinician: Referring Cephas Revard: Treating Lorenda Grecco/Extender: RO BSO N, MICHA EL [place] Plumber Weeks in Treatment: 4 Vital Signs Time Taken: 10:22 Temperature (F): 97.5 Height (in): 77 Pulse (bpm): 56 Weight (lbs): 262 Respiratory Rate (breaths/min): 16 Body Mass Index (BMI): 31.1 Blood Pressure (mmHg): 174/61 Reference Range: 80 - 120 mg / dl Electronic Signature(s) Signed: 12/10/2021 8:00:00 AM By: Carlene Coria RN Dexter, Maher L ([id number]) By: Carlene Coria RN [phone].pdf Page 9 of 9 Signed: 12/10/2021 8:00:00 AM Entered By: Carlene Coria on 12/09/2021 10:23:07

## 2021-12-16 ENCOUNTER — Encounter: Payer: HMO | Admitting: Physician Assistant

## 2021-12-16 DIAGNOSIS — E11621 Type 2 diabetes mellitus with foot ulcer: Secondary | ICD-10-CM | POA: Diagnosis not present

## 2021-12-16 NOTE — Progress Notes (Addendum)
WOODWARD, KLEM ([id number]) [id number] Page 1 of 9 Visit Report for 12/16/2021 Arrival Information Details Patient Name: Date of Service: SHOURYA, MACPHERSON 12/16/2021 9:45 A M Medical Record Number: [id number] Patient Account Number: [id number] Date of Birth/Sex: Treating RN: May 18, 1960 (61 y.o. [name], [name] Primary Care Lorraine Terriquez: Bernardo Heater Other Clinician: Massie Kluver Referring Sandria Mcenroe: Treating Norlene Lanes/Extender: [name]: 5 Visit Information History Since Last Visit All ordered tests and consults were completed: No Patient Arrived: Ambulatory Added or deleted any medications: No Arrival Time: 10:17 Any new allergies or adverse reactions: No Transfer Assistance: None Had a fall or experienced change in No Patient Requires Transmission-Based Precautions: No activities of daily living that may affect Patient Has Alerts: Yes risk of falls: Patient Alerts: 09/05/21 right 1.26 Signs or symptoms of abuse/neglect since last visito No 09/05/21 left 1.10 Hospitalized since last visit: No Implantable device outside of the clinic excluding No cellular tissue based products placed in the center since last visit: Pain Present Now: No Electronic Signature(s) Signed: 12/17/2021 7:52:55 AM By: Massie Kluver Entered By: Massie Kluver on 12/16/2021 10:19:59 -------------------------------------------------------------------------------- Clinic Level of Care Assessment Details Patient Name: Date of Service: JAESHAWN, SILVIO 12/16/2021 9:45 A M Medical Record Number: [id number] Patient Account Number: [id number] Date of Birth/Sex: Treating RN: [birth year] (61 y.o. Verl Blalock Primary Care Analyce Tavares: Bernardo Heater Other Clinician: Massie Kluver Referring Collins Dimaria: Treating Kenden Brandt/Extender: [name]: 5 Clinic Level of Care Assessment Items TOOL 1 Quantity Score _0  -  0 Use when EandM and Procedure is performed on INITIAL visit ASSESSMENTS - Nursing Assessment / Reassessment _1  - 0 General Physical Exam (combine w/ comprehensive assessment (listed just below) when performed on new pt. evals) _2  - 0 Comprehensive Assessment (HX, ROS, Risk Assessments, Wounds Hx, etc.) Bacigalupo, Wojciech L ([id number]) [id number] Page 2 of 9 ASSESSMENTS - Wound and Skin Assessment / Reassessment _3  - 0 Dermatologic / Skin Assessment (not related to wound area) ASSESSMENTS - Ostomy and/or Continence Assessment and Care _4  - 0 Incontinence Assessment and Management _5  - 0 Ostomy Care Assessment and Management (repouching, etc.) PROCESS - Coordination of Care _6  - 0 Simple Patient / Family Education for ongoing care _7  - 0 Complex (extensive) Patient / Family Education for ongoing care _8  - 0 Staff obtains Programmer, systems, Records, T Results / Process Orders est _9  - 0 Staff telephones HHA, Nursing Homes / Clarify orders / etc _10  - 0 Routine Transfer to another Facility (non-emergent condition) _11  - 0 Routine Hospital Admission (non-emergent condition) _12  - 0 New Admissions / Biomedical engineer / Ordering NPWT Apligraf, etc. , _13  - 0 Emergency Hospital Admission (emergent condition) PROCESS - Special Needs _14  - 0 Pediatric / Minor Patient Management _15  - 0 Isolation Patient Management _16  - 0 Hearing / Language / Visual special needs _17  - 0 Assessment of Community assistance (transportation, D/C planning, etc.) _18  - 0 Additional assistance / Altered mentation _19  - 0 Support Surface(s) Assessment (bed, cushion, seat, etc.) INTERVENTIONS - Miscellaneous _20  - 0 External ear exam _21  - 0 Patient Transfer (multiple staff / Civil Service fast streamer / Similar devices) _22  - 0 Simple Staple / Suture removal (25 or less) _23  - 0 Complex Staple / Suture removal (26 or more) _24  - 0 Hypo/Hyperglycemic Management (do not check if billed separately) _25   - 0 Ankle / Brachial Index (ABI) - do not check if billed separately Has the patient been seen at the hospital within the  last three years: Yes Total Score: 0 Level Of Care: ____ Electronic Signature(s) Signed: 12/17/2021 7:52:55 AM By: Massie Kluver Entered By: Massie Kluver on 12/16/2021 10:42:06 -------------------------------------------------------------------------------- Encounter Discharge Information Details Patient Name: Date of Service: [name] 12/16/2021 9:45 A M Medical Record Number: [id number] Patient Account Number: [id number] Date of Birth/Sex: Treating RN: 11/17/1960 (61 y.o. Verl Blalock Primary Care Elasha Tess: Bernardo Heater Other Clinician: Massie Kluver Referring Gricel Copen: Treating Brewster Wolters/Extender: [name]: [place], Reynolds Bowl ([id number]) [id number] Page 3 of 9 Encounter Discharge Information Items Post Procedure Vitals Discharge Condition: Stable Temperature (F): 97.9 Ambulatory Status: Cane Pulse (bpm): 57 Discharge Destination: Home Respiratory Rate (breaths/min): 16 Transportation: Private Auto Blood Pressure (mmHg): 176/85 Accompanied By: self Schedule Follow-up Appointment: Yes Clinical Summary of Care: Electronic Signature(s) Signed: 12/17/2021 7:52:55 AM By: Massie Kluver Entered By: Massie Kluver on 12/16/2021 13:25:58 -------------------------------------------------------------------------------- Lower Extremity Assessment Details Patient Name: Date of Service: LARWENCE, TU 12/16/2021 9:45 A M Medical Record Number: [id number] Patient Account Number: [id number] Date of Birth/Sex: Treating RN: 1960-03-15 (61 y.o. Verl Blalock Primary Care Quiera Diffee: Bernardo Heater Other Clinician: Massie Kluver Referring Sharra Cayabyab: Treating Makani Seckman/Extender: [name]: 5 Electronic Signature(s) Signed: 12/17/2021 7:52:55 AM  By: Massie Kluver Signed: 12/17/2021 5:24:20 PM By: Gretta Cool BSN, RN, CWS, Kim RN, BSN Entered By: Massie Kluver on 12/16/2021 10:30:20 -------------------------------------------------------------------------------- Multi Wound Chart Details Patient Name: Date of Service: [name] 12/16/2021 9:45 A M Medical Record Number: [id number] Patient Account Number: [id number] Date of Birth/Sex: Treating RN: [birth year] (61 y.o. [name], [name] Primary Care Loomis Anacker: Bernardo Heater Other Clinician: Massie Kluver Referring Anessia Oakland: Treating Analysse Quinonez/Extender: [name]: 5 Vital Signs Height(in): 77 Pulse(bpm): 68 Weight(lbs): 262 Blood Pressure(mmHg): 176/85 Body Mass Index(BMI): 31.1 Temperature(F): 97.9 Respiratory Rate(breaths/min): 16 Carreno, Kippy L ([id number]) [1:Photos:] [N/A:N/A] Right, Plantar T Great oe N/A N/A Wound Location: Gradually Appeared N/A N/A Wounding Event: Diabetic Wound/Ulcer of the Lower N/A N/A Primary Etiology: Extremity Coronary Artery Disease, N/A N/A Comorbid History: Hypertension, Peripheral Venous Disease, Type II Diabetes 02/15/2020 N/A N/A Date Acquired: 5 N/A N/A Weeks of Treatment: Open N/A N/A Wound Status: No N/A N/A Wound Recurrence: 0.6x0.5x0.2 N/A N/A Measurements L x W x D (cm) 0.236 N/A N/A A (cm) : rea 0.047 N/A N/A Volume (cm) : 31.80% N/A N/A % Reduction in A rea: 54.80% N/A N/A % Reduction in Volume: Grade 2 N/A N/A Classification: Medium N/A N/A Exudate A mount: Serosanguineous N/A N/A Exudate Type: red, brown N/A N/A Exudate Color: Medium (34-66%) N/A N/A Granulation A mount: Pink N/A N/A Granulation Quality: Medium (34-66%) N/A N/A Necrotic A mount: Fat Layer (Subcutaneous Tissue): Yes N/A N/A Exposed Structures: Fascia: No Tendon: No Muscle: No Joint: No Bone: No None N/A N/A Epithelialization: Treatment Notes Electronic Signature(s) Signed:  12/17/2021 7:52:55 AM By: Massie Kluver Entered By: Massie Kluver on 12/16/2021 10:30:38 -------------------------------------------------------------------------------- Multi-Disciplinary Care Plan Details Patient Name: Date of Service: [name] 12/16/2021 9:45 A M Medical Record Number: [id number] Patient Account Number: [id number] Date of Birth/Sex: Treating RN: 25-Dec-1960 (61 y.o. Verl Blalock Primary Care Shirlean Berman: Bernardo Heater Other Clinician: Massie Kluver Referring Nikholas Geffre: Treating Raylen Ken/Extender: [name]: 5 Active Inactive Necrotic Tissue Nursing Diagnoses: Impaired tissue integrity related to necrotic/devitalized tissue Dubree, Windell L ([id number]) [id number] Page 5 of 9 Knowledge deficit related to management of necrotic/devitalized tissue Goals: Necrotic/devitalized tissue will be minimized  in the wound bed Date Initiated: 11/11/2021 Target Resolution Date: 11/11/2021 Goal Status: Active Patient/caregiver will verbalize understanding of reason and process for debridement of necrotic tissue Date Initiated: 11/11/2021 Date Inactivated: 12/02/2021 Target Resolution Date: 11/11/2021 Goal Status: Met Interventions: Assess patient pain level pre-, during and post procedure and prior to discharge Provide education on necrotic tissue and debridement process Treatment Activities: Excisional debridement : 11/11/2021 Notes: Nutrition Nursing Diagnoses: Potential for alteratiion in Nutrition/Potential for imbalanced nutrition Goals: Patient/caregiver verbalizes understanding of need to maintain therapeutic glucose control per primary care physician Date Initiated: 11/07/2021 Target Resolution Date: 12/08/2021 Goal Status: Active Interventions: Assess HgA1c results as ordered upon admission and as needed Assess patient nutrition upon admission and as needed per policy Provide education on  elevated blood sugars and impact on wound healing Provide education on nutrition Treatment Activities: Education provided on Nutrition : 12/02/2021 Notes: Wound/Skin Impairment Nursing Diagnoses: Knowledge deficit related to ulceration/compromised skin integrity Goals: Patient/caregiver will verbalize understanding of skin care regimen Date Initiated: 11/07/2021 Target Resolution Date: 12/08/2021 Goal Status: Active Ulcer/skin breakdown will have a volume reduction of 30% by week 4 Date Initiated: 11/07/2021 Target Resolution Date: 01/07/2022 Goal Status: Active Ulcer/skin breakdown will have a volume reduction of 50% by week 8 Date Initiated: 11/07/2021 Target Resolution Date: 02/07/2022 Goal Status: Active Ulcer/skin breakdown will have a volume reduction of 80% by week 12 Date Initiated: 11/07/2021 Target Resolution Date: 03/10/2022 Goal Status: Active Ulcer/skin breakdown will heal within 14 weeks Date Initiated: 11/07/2021 Target Resolution Date: 04/15/2022 Goal Status: Active Interventions: Assess patient/caregiver ability to obtain necessary supplies Assess patient/caregiver ability to perform ulcer/skin care regimen upon admission and as needed Assess ulceration(s) every visit Notes: Electronic Signature(s) Signed: 12/17/2021 7:52:55 AM By: Massie Kluver Signed: 12/17/2021 5:24:20 PM By: Gretta Cool, BSN, RN, CWS, Kim RN, BSN Entered By: Massie Kluver on 12/16/2021 10:30:28 Gar[place], Reynolds Bowl ([id number]) [id number] Page 6 of 9 -------------------------------------------------------------------------------- Pain Assessment Details Patient Name: Date of Service: DERRY, KASSEL 12/16/2021 9:45 A M Medical Record Number: [id number] Patient Account Number: [id number] Date of Birth/Sex: Treating RN: [birth year] (61 y.o. Verl Blalock Primary Care Thedore Pickel: Bernardo Heater Other Clinician: Massie Kluver Referring Obediah Welles: Treating Trebor Galdamez/Extender:  [name]: 5 Active Problems Location of Pain Severity and Description of Pain Patient Has Paino No Site Locations Pain Management and Medication Current Pain Management: Electronic Signature(s) Signed: 12/17/2021 7:52:55 AM By: Massie Kluver Signed: 12/17/2021 5:24:20 PM By: Gretta Cool, BSN, RN, CWS, Kim RN, BSN Entered By: Massie Kluver on 12/16/2021 10:23:46 -------------------------------------------------------------------------------- Patient/Caregiver Education Details Patient Name: Date of Service: [name] 11/13/2023andnbsp9:45 A M Medical Record Number: [id number] Patient Account Number: [id number] Date of Birth/Gender: Treating RN: Sep 28, 1960 (61 y.o. Verl Blalock Primary Care Physician: Bernardo Heater Other Clinician: Massie Kluver Referring Physician: Treating Physician/Extender: [name]: Mina, Reynolds Bowl ([id number]) [id number] Page 7 of 9 Education Assessment Education Provided To: Patient Education Topics Provided Offloading: Handouts: Other: discussed TCC placement after Thanksgiving Methods: Explain/Verbal Responses: State content correctly Electronic Signature(s) Signed: 12/17/2021 7:52:55 AM By: Massie Kluver Entered By: Massie Kluver on 12/16/2021 13:25:15 -------------------------------------------------------------------------------- Wound Assessment Details Patient Name: Date of Service: [name] 12/16/2021 9:45 A M Medical Record Number: [id number] Patient Account Number: [id number] Date of Birth/Sex: Treating RN: 1960-12-31 (62 y.o. Verl Blalock Primary Care Daviana Haymaker: Bernardo Heater Other Clinician: Massie Kluver Referring Dariana Garbett: Treating Kendrah Lovern/Extender: [name]: 5 Wound Status Wound  Number: 1 Primary Diabetic Wound/Ulcer of the Lower Extremity Etiology: Wound  Location: Right, Plantar T Great oe Wound Open Wounding Event: Gradually Appeared Status: Date Acquired: 02/15/2020 Comorbid Coronary Artery Disease, Hypertension, Peripheral Venous Weeks Of Treatment: 5 History: Disease, Type II Diabetes Clustered Wound: No Photos Wound Measurements Length: (cm) 0.6 Width: (cm) 0.5 Depth: (cm) 0.2 Area: (cm) 0.236 Volume: (cm) 0.047 % Reduction in Area: 31.8% % Reduction in Volume: 54.8% Epithelialization: None Tunneling: No Undermining: No Wound Description Classification: Grade 2 Exudate Amount: Medium Exudate Type: Serosanguineous [place], Shawn L ([id number]) Exudate Color: red, brown Foul Odor After Cleansing: No Slough/Fibrino Yes [id number] Page 8 of 9 Wound Bed Granulation Amount: Medium (34-66%) Exposed Structure Granulation Quality: Pink Fascia Exposed: No Necrotic Amount: Medium (34-66%) Fat Layer (Subcutaneous Tissue) Exposed: Yes Necrotic Quality: Adherent Slough Tendon Exposed: No Muscle Exposed: No Joint Exposed: No Bone Exposed: No Treatment Notes Wound #1 (Toe Great) Wound Laterality: Plantar, Right Cleanser Wound Cleanser Discharge Instruction: Wash your hands with soap and water. Remove old dressing, discard into plastic bag and place into trash. Cleanse the wound with Wound Cleanser prior to applying a clean dressing using gauze sponges, not tissues or cotton balls. Do not scrub or use excessive force. Pat dry using gauze sponges, not tissue or cotton balls. Peri-Wound Care Topical Primary Dressing Prisma 4.34 (in) Discharge Instruction: Moisten w/normal saline or sterile water; Cover wound as directed. Do not remove from wound bed. Secondary Dressing Gauze Secured With Medipore T - 58M Medipore H Soft Cloth Surgical T ape ape, 2x2 (in/yd) Compression Wrap Compression Stockings Add-Ons Electronic Signature(s) Signed: 12/17/2021 7:52:55 AM By: Massie Kluver Signed:  12/17/2021 5:24:20 PM By: Gretta Cool, BSN, RN, CWS, Kim RN, BSN Entered By: Massie Kluver on 12/16/2021 10:29:44 -------------------------------------------------------------------------------- Vitals Details Patient Name: Date of Service: [name] 12/16/2021 9:45 A M Medical Record Number: [id number] Patient Account Number: [id number] Date of Birth/Sex: Treating RN: 01-26-1961 (61 y.o. [name], [name] Primary Care Avrom Robarts: Bernardo Heater Other Clinician: Massie Kluver Referring Cierra Rothgeb: Treating Auther Lyerly/Extender: [name]: 5 Vital Signs Time Taken: 10:20 Temperature (F): 97.9 Height (in): 77 Pulse (bpm): 57 Weight (lbs): 262 Respiratory Rate (breaths/min): 16 Body Mass Index (BMI): 31.1 Blood Pressure (mmHg): 176/85 Reference Range: 80 - 120 mg / dl Revoir, Chason L ([id number]) [id number] Page 9 of 9 Electronic Signature(s) Signed: 12/17/2021 7:52:55 AM By: Massie Kluver Entered By: Massie Kluver on 12/16/2021 10:23:34

## 2021-12-16 NOTE — Progress Notes (Signed)
DAMIEON, ARMENDARIZ ([id number]) [id number] Page 1 of 2 Visit Report for 12/16/2021 Chief Complaint Document Details Patient Name: Date of Service: LONNELL, CHAPUT 12/16/2021 9:45 A M Medical Record Number: [id number] Patient Account Number: [id number] Date of Birth/Sex: Treating RN: 18-Dec-1960 (61 y.o. Verl Blalock Primary Care Provider: Bernardo Heater Other Clinician: Massie Kluver Referring Provider: Treating Provider/Extender: [name]: 5 Information Obtained from: Patient Chief Complaint Right great toe ulcer Electronic Signature(s) Signed: 12/16/2021 9:41:54 AM By: Worthy Keeler PA-C Entered By: Worthy Keeler on 12/16/2021 09:41:54 -------------------------------------------------------------------------------- Problem List Details Patient Name: Date of Service: [name] 12/16/2021 9:45 A M Medical Record Number: [id number] Patient Account Number: [id number] Date of Birth/Sex: Treating RN: 22-Jun-1960 (61 y.o. 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

## 2021-12-30 ENCOUNTER — Encounter: Payer: HMO | Admitting: Physician Assistant

## 2021-12-30 DIAGNOSIS — E11621 Type 2 diabetes mellitus with foot ulcer: Secondary | ICD-10-CM | POA: Diagnosis not present

## 2021-12-30 NOTE — Progress Notes (Signed)
[name] ([id number]) [id number] Page 1 of 8 Visit Report for 12/30/2021 Chief Complaint Document Details Patient Name: Date of Service: [name] 12/30/2021 9:45 A M Medical Record Number: [id number] Patient Account Number: [id number] Date of Birth/Sex: Treating RN: [birth year] (61 y.o. [name] Primary Care Provider: Bernardo Heater Other Clinician: Massie Kluver Referring Provider: Treating Provider/Extender: [name]: 7 Information Obtained from: Patient Chief Complaint Right great toe ulcer Electronic Signature(s) Signed: 12/30/2021 9:53:35 AM By: Worthy Keeler PA-C Entered By: Worthy Keeler on 12/30/2021 09:53:35 -------------------------------------------------------------------------------- Debridement Details Patient Name: Date of Service: [name] 12/30/2021 9:45 A M Medical Record Number: [id number] Patient Account Number: [id number] Date of Birth/Sex: Treating RN: 12/26/1960 (61 y.o. [name], [name] Primary Care Provider: Bernardo Heater Other Clinician: Massie Kluver Referring Provider: Treating Provider/Extender: [name]: 7 Debridement Performed for Assessment: Wound #1 Right,Plantar T Great oe Performed By: Physician Tommie Sams., PA-C Debridement Type: Debridement Severity of Tissue Pre Debridement: Fat layer exposed Level of Consciousness (Pre-procedure): Awake and Alert Pre-procedure Verification/Time Out Yes - 10:41 Taken: Start Time: 10:41 T Area Debrided ([name] x W): otal 1 (cm) x 1 (cm) = 1 (cm) Tissue and other material debrided: Viable, Non-Viable, Callus, Slough, Subcutaneous, Slough Level: Skin/Subcutaneous Tissue Debridement Description: Excisional Instrument: Curette Bleeding: Minimum Hemostasis Achieved: Pressure Response to Treatment: Procedure was tolerated well Level of Consciousness (Post- Awake  and Alert procedure): [name], [name] ([id number]) [id number] Page 2 of 8 Post Debridement Measurements of Total Wound Length: (cm) 0.5 Width: (cm) 0.5 Depth: (cm) 0.2 Volume: (cm) 0.039 Character of Wound/Ulcer Post Debridement: Stable Severity of Tissue Post Debridement: Fat layer exposed Post Procedure Diagnosis Same as Pre-procedure Electronic Signature(s) Signed: 12/30/2021 4:14:29 PM By: Worthy Keeler PA-C Signed: 12/30/2021 5:11:01 PM By: Massie Kluver Signed: 12/31/2021 1:12:02 PM By: Gretta Cool, BSN, RN, CWS, Kim RN, BSN Entered By: Massie Kluver on 12/30/2021 10:43:17 -------------------------------------------------------------------------------- HPI Details Patient Name: Date of Service: [name]. 12/30/2021 9:45 A M Medical Record Number: [id number] Patient Account Number: [id number] Date of Birth/Sex: Treating RN: Mar 02, 1960 (61 y.o. [name] Primary Care Provider: Bernardo Heater Other Clinician: Massie Kluver Referring Provider: Treating Provider/Extender: [name]: 7 History of Present Illness HPI Description: 11-07-2021 upon evaluation today patient appears to be doing somewhat poorly in regard to a wound on his right plantar great toe. Fortunately there does not appear to be any evidence of active infection locally or systemically at this time which is great news. No fevers, chills, nausea, vomiting, or diarrhea. With that being said he tells me that he really has not been noticing a lot of improvement either with regard to the treatment. He has been under the care of Dr. Earleen Newport up to this point for roughly a year. More recently he did have an MRI which revealed the potential for possible osteomyelitis. With that being said I do believe that he definitely should probably be monitored for this but to be honest the Elesa Hacker that he was given probably did a good job. Considering there  was not much being seen on MRI I do not think there is a high likelihood of severe osteomyelitis at this point we will definitely keep an eye on things however. Nonetheless right now has been utilizing Silvadene cream I definitely think we can find something better to help get this moving in the right direction. Patient does have  a history of diabetes mellitus type 2, chronic kidney disease stage III, hypertension, and again he has had an amputation of the left leg currently. The right leg is the 1 that we are actually treating the great toe wound on. He has a below-knee amputation. 11-11-2021 upon evaluation today patient's wound actually is showing signs of excellent improvement. I am actually very pleased even compared to last week this is already better. He is very happy as well. 12-02-2021 upon evaluation today patient appears to be doing pretty well in regard to his toe ulcer this been several weeks since I saw him part of it was our scheduling with me being out of town and part of it was he actually missed an appointment secondary to his trunk the will actually fell off and he had some issues there getting it fixed on Monday so he missed his appointment last Monday. Nonetheless the good news is in today things appear to be doing well no signs of infection though he does have a pretty good need for sharp debridement to try to clear some of this away. 11/6; right first toes ulcer complicated by his left-sided BKA.Marland KitchenNo major change from last visit 12-16-2021 upon evaluation today patient appears to be doing well currently in regard to his toe ulcer that was not making a good amount of improvement yet I do believe the total contact cast is really good to be necessary. I talked about that before he tells me his wife can drive him around he is done with working now also he can actually look towards doing this. 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 12-30-2021 upon evaluation today patient appears to be doing well currently in regard to his foot ulcer on the toe although he is still building up quite a bit of callus. Will actually get the total contact cast on him today and started which I think is good to be very beneficial. Electronic Signature(s) Signed: 12/30/2021 1:40:28 PM By: Worthy Keeler PA-C Entered By: Worthy Keeler on 12/30/2021 13:40:27 [name], [name] ([id number]) [id number] Page 3 of 8 -------------------------------------------------------------------------------- Physical Exam Details Patient Name: Date of Service: [name], [name] 12/30/2021 9:45 A M Medical Record Number: [id number] Patient Account Number: [id number] Date of Birth/Sex: Treating RN: 23-Dec-1960 (61 y.o. [name] Primary Care Provider: Bernardo Heater Other Clinician: Massie Kluver Referring Provider: Treating Provider/Extender: [name]: 7 Constitutional Well-nourished and well-hydrated in no acute distress. Respiratory normal breathing without difficulty. Psychiatric this patient is able to make decisions and demonstrates good insight into disease process. Alert and Oriented x 3. pleasant and cooperative. Notes Upon inspection patient's wound bed actually showed signs of good granulation epithelization at this point. Fortunately there does not appear to be any evidence of active infection I did perform debridement clearway callus and subcutaneous tissue down to healthy tissue and he tolerated this without complication. Postdebridement I then went ahead forward to pad and applied a total contact casting which does seem to be doing extremely well for him. I am extremely pleased with where we stand currently. Electronic Signature(s) Signed: 12/30/2021 1:41:02 PM By: Worthy Keeler PA-C Entered By: Worthy Keeler on 12/30/2021 13:41:01 -------------------------------------------------------------------------------- Physician Orders Details Patient Name: Date of Service: [name] 12/30/2021 9:45 A M Medical Record Number: [id number] Patient Account  Number: [id number] Date of Birth/Sex: Treating RN: [birth year] (61 y.o. [name] Primary Care Provider: Bernardo Heater Other Clinician: Massie Kluver Referring Provider: Treating Provider/Extender: [name]: 7 Verbal / Phone Orders: No Diagnosis Coding ICD-10 Coding Code Description E11.621 Type 2 diabetes mellitus with foot ulcer L97.512 Non-pressure chronic ulcer of other part of right foot with fat layer exposed N18.30 Chronic kidney disease, stage 3 unspecified I10 Essential (primary) hypertension [name], [name] ([id number]) [id number] Page 4 of 8 Follow-up Appointments Return Appointment in 1 week. Bathing/ Shower/ Hygiene May shower; gently cleanse wound with antibacterial soap, rinse and pat dry prior to dressing wounds No tub bath. - Don't get cast wet Anesthetic (Use 'Patient Medications' Section for Anesthetic Order Entry) Lidocaine applied to wound bed Edema Control - Lymphedema / Segmental Compressive Device / Other Elevate, Exercise Daily and A void Standing for Long Periods of Time. Elevate legs to the level of the heart and pump ankles as often as possible Elevate leg(s) parallel to the floor when sitting. Off-Loading Total Contact Cast to Right Lower Extremity - TCC #1 applied Removable cast walker boot Other: - foam pad to cover toe Wound Treatment Wound #1 - T Great oe Wound Laterality: Plantar, Right Cleanser: Wound Cleanser 3 x Per Week Discharge Instructions: Wash your hands with soap and water. Remove old dressing, discard into plastic bag and place into trash. Cleanse the wound with Wound Cleanser prior to applying a clean  dressing using gauze sponges, not tissues or cotton balls. Do not scrub or use excessive force. Pat dry using gauze sponges, not tissue or cotton balls. Prim Dressing: Prisma 4.34 (in) 3 x Per Week ary Discharge Instructions: Moisten w/normal saline or sterile water; Cover wound as directed. Do not remove from wound bed. Secondary Dressing: Gauze 3 x Per Week Secured With: Medipore T - 13M Medipore H Soft Cloth Surgical T ape ape, 2x2 (in/yd) 3 x Per Week Electronic Signature(s) Signed: 12/30/2021 4:14:29 PM By: Worthy Keeler PA-C Signed: 12/30/2021 5:11:01 PM By: Massie Kluver Entered By: Massie Kluver on 12/30/2021 11:03:44 -------------------------------------------------------------------------------- Problem List Details Patient Name: Date of Service: [name]. 12/30/2021 9:45 A M Medical Record Number: [id number] Patient Account Number: [id number] Date of Birth/Sex: Treating RN: October 08, 1960 (61 y.o. [name] Primary Care Provider: Bernardo Heater Other Clinician: Massie Kluver Referring Provider: Treating Provider/Extender: [name]: 7 Active Problems ICD-10 Encounter Code Description Active Date MDM Diagnosis E11.621 Type 2 diabetes mellitus with foot ulcer 11/07/2021 No Yes L97.512 Non-pressure chronic ulcer of other part of right foot with fat layer exposed 11/07/2021 No Yes [name], [name] ([id number]) [id number] Page 5 of 8 N18.30 Chronic kidney disease, stage 3 unspecified 11/07/2021 No Yes I10 Essential (primary) hypertension 11/07/2021 No Yes Inactive Problems Resolved Problems Electronic Signature(s) Signed: 12/30/2021 9:53:30 AM By: Worthy Keeler PA-C Entered By: Worthy Keeler on 12/30/2021 09:53:29 -------------------------------------------------------------------------------- Progress Note Details Patient Name: Date of Service: [name]. 12/30/2021 9:45 A  M Medical Record Number: [id number] Patient Account Number: [id number] Date of Birth/Sex: Treating RN: Jul 01, 1960 (61 y.o. [name] Primary Care Provider: Bernardo Heater Other Clinician: Massie Kluver Referring Provider: Treating Provider/Extender: [name]: 7 Subjective Chief Complaint Information obtained from Patient Right great toe ulcer History of Present Illness (HPI) 11-07-2021 upon evaluation today patient appears to be doing somewhat poorly in regard to a wound on his right plantar great toe.  Fortunately there does not appear to be any evidence of active infection locally or systemically at this time which is great news. No fevers, chills, nausea, vomiting, or diarrhea. With that being said he tells me that he really has not been noticing a lot of improvement either with regard to the treatment. He has been under the care of Dr. Earleen Newport up to this point for roughly a year. More recently he did have an MRI which revealed the potential for possible osteomyelitis. With that being said I do believe that he definitely should probably be monitored for this but to be honest the Elesa Hacker that he was given probably did a good job. Considering there was not much being seen on MRI I do not think there is a high likelihood of severe osteomyelitis at this point we will definitely keep an eye on things however. Nonetheless right now has been utilizing Silvadene cream I definitely think we can find something better to help get this moving in the right direction. Patient does have a history of diabetes mellitus type 2, chronic kidney disease stage III, hypertension, and again he has had an amputation of the left leg currently. The right leg is the 1 that we are actually treating the great toe wound on. He has a below-knee amputation. 11-11-2021 upon evaluation today patient's wound actually is showing signs of excellent improvement. I am actually very pleased  even compared to last week this is already better. He is very happy as well. 12-02-2021 upon evaluation today patient appears to be doing pretty well in regard to his toe ulcer this been several weeks since I saw him part of it was our scheduling with me being out of town and part of it was he actually missed an appointment secondary to his trunk the will actually fell off and he had some issues there getting it fixed on Monday so he missed his appointment last Monday. Nonetheless the good news is in today things appear to be doing well no signs of infection though he does have a pretty good need for sharp debridement to try to clear some of this away. 11/6; right first toes ulcer complicated by his left-sided BKA.Marland KitchenNo major change from last visit 12-16-2021 upon evaluation today patient appears to be doing well currently in regard to his toe ulcer that was not making a good amount of improvement yet I do believe the total contact cast is really good to be necessary. I talked about that before he tells me his wife can drive him around he is done with working now also he can actually look towards doing this. 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 12-30-2021 upon evaluation today patient appears to be doing well currently in regard to his foot ulcer on the toe although he is still building up quite a bit of callus. Will actually get the total contact cast on him today and started which I think is good to be very beneficial. [name], [name] ([id number]) [phone].pdf Page 6 of 8 Objective Constitutional Well-nourished and well-hydrated in no acute distress. Vitals Time Taken: 10:11 AM, Height: 77 in, Weight: 262 lbs, BMI: 31.1, Temperature: 97.6 F, Pulse: 60 bpm, Respiratory Rate: 18 breaths/min, Blood Pressure: 164/84 mmHg. Respiratory normal breathing  without difficulty. Psychiatric this patient is able to make decisions and demonstrates good insight into disease process. Alert and  Oriented x 3. pleasant and cooperative. General Notes: Upon inspection patient's wound bed actually showed signs of good granulation epithelization at this point. Fortunately there does not appear to be any evidence of active infection I did perform debridement clearway callus and subcutaneous tissue down to healthy tissue and he tolerated this without complication. Postdebridement I then went ahead forward to pad and applied a total contact casting which does seem to be doing extremely well for him. I am extremely pleased with where we stand currently. Integumentary (Hair, Skin) Wound #1 status is Open. Original cause of wound was Gradually Appeared. The date acquired was: 02/15/2020. The wound has been in treatment 7 weeks. The wound is located on the [employer]. The wound measures 0.4cm length x 0.4cm width x 0.1cm depth; 0.126cm^2 area and 0.013cm^3 volume. oe There is Fat Layer (Subcutaneous Tissue) exposed. There is a medium amount of serosanguineous drainage noted. There is medium (34-66%) pink granulation within the wound bed. There is a medium (34-66%) amount of necrotic tissue within the wound bed including Adherent Slough. Assessment Active Problems ICD-10 Type 2 diabetes mellitus with foot ulcer Non-pressure chronic ulcer of other part of right foot with fat layer exposed Chronic kidney disease, stage 3 unspecified Essential (primary) hypertension Procedures Wound #1 Pre-procedure diagnosis of Wound #1 is a Diabetic Wound/Ulcer of the Lower Extremity located on the Right,Plantar T Great .Severity of Tissue Pre oe Debridement is: Fat layer exposed. There was a Excisional Skin/Subcutaneous Tissue Debridement with a total area of 1 sq cm performed by Tommie Sams., PA-C. With the following instrument(s): Curette to remove Viable and  Non-Viable tissue/material. Material removed includes Callus, Subcutaneous Tissue, and Slough. A time out was conducted at 10:41, prior to the start of the procedure. A Minimum amount of bleeding was controlled with Pressure. The procedure was tolerated well. Post Debridement Measurements: 0.5cm length x 0.5cm width x 0.2cm depth; 0.039cm^3 volume. Character of Wound/Ulcer Post Debridement is stable. Severity of Tissue Post Debridement is: Fat layer exposed. Post procedure Diagnosis Wound #1: Same as Pre-Procedure Pre-procedure diagnosis of Wound #1 is a Diabetic Wound/Ulcer of the Lower Extremity located on the Right,Plantar T Great . There was a T Contact oe otal Cast Procedure by Tommie Sams., PA-C. Post procedure Diagnosis Wound #1: Same as Pre-Procedure Notes: TCC #1 applied. Plan Follow-up Appointments: Return Appointment in 1 week. Bathing/ Shower/ Hygiene: May shower; gently cleanse wound with antibacterial soap, rinse and pat dry prior to dressing wounds No tub bath. - Don't get cast wet Anesthetic (Use 'Patient Medications' Section for Anesthetic Order Entry): Lidocaine applied to wound bed Edema Control - Lymphedema / Segmental Compressive Device / Other: Elevate, Exercise Daily and Avoid Standing for Long Periods of Time. Elevate legs to the level of the heart and pump ankles as often as possible Elevate leg(s) parallel to the floor when sitting. Off-Loading: T Contact Cast to Right Lower Extremity - TCC #1 applied otal Removable cast walker boot Other: - foam pad to cover toe WOUND #1: - T Great Wound Laterality: Plantar, Right oe [name], [name] ([id number]) [id number] Page 7 of 8 Cleanser: Wound Cleanser 3 x Per Week/ Discharge Instructions: Wash your hands with soap and water. Remove old dressing, discard into plastic bag and place into trash. Cleanse the wound with Wound Cleanser prior to applying a clean dressing using gauze sponges,  not tissues or cotton balls. Do not scrub or use excessive force. Pat dry using gauze sponges, not tissue or cotton  balls. Prim Dressing: Prisma 4.34 (in) 3 x Per Week/ ary Discharge Instructions: Moisten w/normal saline or sterile water; Cover wound as directed. Do not remove from wound bed. Secondary Dressing: Gauze 3 x Per Week/ Secured With: Medipore T - 70M Medipore H Soft Cloth Surgical T ape ape, 2x2 (in/yd) 3 x Per Week/ 1. Based on what I see I do believe that the patient should continue with the wound care measures as before using the total contact cast we will be seeing him on Thursday in the meantime we did stick with the Prisma which I think is still a good option. 2. I am also can recommend that we have the patient continue to monitor for any signs of infection. Obviously we will keep a close eye on things since he is in the cast that we will be change the cast out that we look at where things stand. We will see patient back for reevaluation in 1 week here in the clinic. If anything worsens or changes patient will contact our office for additional recommendations. Electronic Signature(s) Signed: 12/30/2021 1:41:34 PM By: Worthy Keeler PA-C Entered By: Worthy Keeler on 12/30/2021 13:41:34 -------------------------------------------------------------------------------- Total Contact Cast Details Patient Name: Date of Service: [name], [name] 12/30/2021 9:45 A M Medical Record Number: [id number] Patient Account Number: [id number] Date of Birth/Sex: Treating RN: October 30, 1960 (61 y.o. [name] Primary Care Provider: Bernardo Heater Other Clinician: Massie Kluver Referring Provider: Treating Provider/Extender: [name]: 7 T Contact Cast Applied for Wound Assessment: otal Wound #1 Right,Plantar T Great oe Performed By: Physician Tommie Sams., PA-C Post Procedure Diagnosis Same as Pre-procedure Notes TCC #1  applied Electronic Signature(s) Signed: 12/30/2021 4:14:29 PM By: Worthy Keeler PA-C Signed: 12/30/2021 5:11:01 PM By: Massie Kluver Entered By: Massie Kluver on 12/30/2021 10:44:15 -------------------------------------------------------------------------------- SuperBill Details Patient Name: Date of Service: [name] 12/30/2021 [name], [name] ([id number]) [id number] Page 8 of 8 Medical Record Number: [id number] Patient Account Number: [id number] Date of Birth/Sex: Treating RN: 04/05/1960 (61 y.o. [name] Primary Care Provider: Bernardo Heater Other Clinician: Massie Kluver Referring Provider: Treating Provider/Extender: [name]: 7 Diagnosis Coding ICD-10 Codes Code Description E11.621 Type 2 diabetes mellitus with foot ulcer L97.512 Non-pressure chronic ulcer of other part of right foot with fat layer exposed N18.30 Chronic kidney disease, stage 3 unspecified I10 Essential (primary) hypertension Facility Procedures : CPT4 Code: [id number] Description: 95638 - DEB SUBQ TISSUE 20 SQ CM/< ICD-10 Diagnosis Description L97.512 Non-pressure chronic ulcer of other part of right foot with fat layer exposed Modifier: Quantity: 1 Physician Procedures : CPT4 Code Description Modifier [id number] - WC PHYS SUBQ TISS 20 SQ CM ICD-10 Diagnosis Description L97.512 Non-pressure chronic ulcer of other part of right foot with fat layer exposed Quantity: 1 Electronic Signature(s) Signed: 12/30/2021 1:46:18 PM By: Worthy Keeler PA-C Entered By: Worthy Keeler on 12/30/2021 13:46:17

## 2021-12-30 NOTE — Progress Notes (Addendum)
[name] ([id number]) [id number] Page 1 of 9 Visit Report for 12/30/2021 Arrival Information Details Patient Name: Date of Service: [name], [name] 12/30/2021 9:45 A M Medical Record Number: [id number] Patient Account Number: [id number] Date of Birth/Sex: Treating RN: 09-03-1960 (61 y.o. [name], [name] Primary Care [name]: Bernardo Heater Other Clinician: Massie Kluver Referring Nianna Igo: Treating [name]/Extender: [name]: 7 Visit Information History Since Last Visit All ordered tests and consults were completed: No Patient Arrived: Ambulatory Added or deleted any medications: No Arrival Time: 10:09 Any new allergies or adverse reactions: No Transfer Assistance: None Had a fall or experienced change in No Patient Identification Verified: Yes activities of daily living that may affect Secondary Verification Process Completed: Yes risk of falls: Patient Requires Transmission-Based Precautions: No Signs or symptoms of abuse/neglect since last visito No Patient Has Alerts: Yes Hospitalized since last visit: No Patient Alerts: 09/05/21 right 1.26 Implantable device outside of the clinic excluding No 09/05/21 left 1.10 cellular tissue based products placed in the center since last visit: Has Dressing in Place as Prescribed: Yes Pain Present Now: No Electronic Signature(s) Signed: 12/30/2021 5:11:01 PM By: Massie Kluver Entered By: Massie Kluver on 12/30/2021 10:10:03 -------------------------------------------------------------------------------- Clinic Level of Care Assessment Details Patient Name: Date of Service: [name], [name] 12/30/2021 9:45 A M Medical Record Number: [id number] Patient Account Number: [id number] Date of Birth/Sex: Treating RN: November 03, 1960 (61 y.o. [name] Primary Care Zakye Baby: Bernardo Heater Other Clinician: Massie Kluver Referring [name]: Treating  [name]/Extender: [name]: 7 Clinic Level of Care Assessment Items TOOL 1 Quantity Score _0  - 0 Use when EandM and Procedure is performed on INITIAL visit ASSESSMENTS - Nursing Assessment / Reassessment _1  - 0 General Physical Exam (combine w/ comprehensive assessment (listed just below) when performed on new pt. evals) _2  - 0 Comprehensive Assessment (HX, ROS, Risk Assessments, Wounds Hx, etc.) [name] ([id number]) [id number] Page 2 of 9 ASSESSMENTS - Wound and Skin Assessment / Reassessment _3  - 0 Dermatologic / Skin Assessment (not related to wound area) ASSESSMENTS - Ostomy and/or Continence Assessment and Care _4  - 0 Incontinence Assessment and Management _5  - 0 Ostomy Care Assessment and Management (repouching, etc.) PROCESS - Coordination of Care _6  - 0 Simple Patient / Family Education for ongoing care _7  - 0 Complex (extensive) Patient / Family Education for ongoing care _8  - 0 Staff obtains Programmer, systems, Records, T Results / Process Orders est _9  - 0 Staff telephones HHA, Nursing Homes / Clarify orders / etc _10  - 0 Routine Transfer to another Facility (non-emergent condition) _11  - 0 Routine Hospital Admission (non-emergent condition) _12  - 0 New Admissions / Biomedical engineer / Ordering NPWT Apligraf, etc. , _13  - 0 Emergency Hospital Admission (emergent condition) PROCESS - Special Needs _14  - 0 Pediatric / Minor Patient Management _15  - 0 Isolation Patient Management _16  - 0 Hearing / Language / Visual special needs _17  - 0 Assessment of Community assistance (transportation, D/C planning, etc.) _18  - 0 Additional assistance / Altered mentation _19  - 0 Support Surface(s) Assessment (bed, cushion, seat, etc.) INTERVENTIONS - Miscellaneous _20  - 0 External ear exam _21  - 0 Patient Transfer (multiple staff / Civil Service fast streamer / Similar devices) _22  - 0 Simple Staple / Suture removal (25 or  less) _23  - 0 Complex Staple / Suture removal (26 or more) _24  - 0 Hypo/Hyperglycemic Management (do not check if billed separately) _25  - 0 Ankle / Brachial Index (ABI) -  do not check if billed separately Has the patient been seen at the hospital within the last three years: Yes Total Score: 0 Level Of Care: ____ Electronic Signature(s) Signed: 12/30/2021 5:11:01 PM By: Massie Kluver Entered By: Massie Kluver on 12/30/2021 11:02:27 -------------------------------------------------------------------------------- Encounter Discharge Information Details Patient Name: Date of Service: [name]. 12/30/2021 9:45 A M Medical Record Number: [id number] Patient Account Number: [id number] Date of Birth/Sex: Treating RN: [birth year] (61 y.o. [name] Primary Care Sidney Silberman: Bernardo Heater Other Clinician: Massie Kluver Referring Avleen Bordwell: Treating [name]/Extender: Elbert Ewings [place], [place] ([id number]) [id number] Page 3 of 9 Weeks in Treatment: 7 Encounter Discharge Information Items Post Procedure Vitals Discharge Condition: Stable Temperature (F): 97.6 Ambulatory Status: Ambulatory Pulse (bpm): 60 Discharge Destination: Home Respiratory Rate (breaths/min): 18 Transportation: Private Auto Blood Pressure (mmHg): 164/84 Accompanied By: self Schedule Follow-up Appointment: Yes Clinical Summary of Care: Electronic Signature(s) Signed: 12/30/2021 5:11:01 PM By: Massie Kluver Entered By: Massie Kluver on 12/30/2021 11:04:56 -------------------------------------------------------------------------------- Lower Extremity Assessment Details Patient Name: Date of Service: [name], [name] 12/30/2021 9:45 A M Medical Record Number: [id number] Patient Account Number: [id number] Date of Birth/Sex: Treating RN: 1960/06/26 (61 y.o. [name] Primary Care Alyze Lauf: Bernardo Heater Other Clinician: Massie Kluver Referring  Tvisha Schwoerer: Treating Amahri Dengel/Extender: [name]: 7 Electronic Signature(s) Signed: 12/30/2021 5:11:01 PM By: Massie Kluver Signed: 12/31/2021 1:12:02 PM By: Gretta Cool BSN, RN, CWS, Kim RN, BSN Entered By: Massie Kluver on 12/30/2021 10:18:03 -------------------------------------------------------------------------------- Multi Wound Chart Details Patient Name: Date of Service: [name] 12/30/2021 9:45 A M Medical Record Number: [id number] Patient Account Number: [id number] Date of Birth/Sex: Treating RN: [birth year] (61 y.o. [name] Primary Care Boysie Bonebrake: Bernardo Heater Other Clinician: Massie Kluver Referring Ociel Retherford: Treating Theophil Thivierge/Extender: [name]: 7 Vital Signs Height(in): 77 Pulse(bpm): 60 Weight(lbs): 262 Blood Pressure(mmHg): 164/84 Body Mass Index(BMI): 31.1 Temperature(F): 97.6 Respiratory Rate(breaths/min): 18 [[name], [name] ([id number]):Photos:] [[id number] Page 4 of 9:1 N/A N/A N/A N/A] Right, Plantar T Great oe N/A N/A Wound Location: Gradually Appeared N/A N/A Wounding Event: Diabetic Wound/Ulcer of the Lower N/A N/A Primary Etiology: Extremity Coronary Artery Disease, N/A N/A Comorbid History: Hypertension, Peripheral Venous Disease, Type II Diabetes 02/15/2020 N/A N/A Date Acquired: 7 N/A N/A Weeks of Treatment: Open N/A N/A Wound Status: No N/A N/A Wound Recurrence: 0.4x0.4x0.1 N/A N/A Measurements [name] x W x D (cm) 0.126 N/A N/A A (cm) : rea 0.013 N/A N/A Volume (cm) : 63.60% N/A N/A % Reduction in A rea: 87.50% N/A N/A % Reduction in Volume: Grade 2 N/A N/A Classification: Medium N/A N/A Exudate A mount: Serosanguineous N/A N/A Exudate Type: red, brown N/A N/A Exudate Color: Medium (34-66%) N/A N/A Granulation A mount: Pink N/A N/A Granulation Quality: Medium (34-66%) N/A N/A Necrotic A mount: Fat  Layer (Subcutaneous Tissue): Yes N/A N/A Exposed Structures: Fascia: No Tendon: No Muscle: No Joint: No Bone: No None N/A N/A Epithelialization: Treatment Notes Electronic Signature(s) Signed: 12/30/2021 5:11:01 PM By: Massie Kluver Entered By: Massie Kluver on 12/30/2021 10:18:23 -------------------------------------------------------------------------------- Multi-Disciplinary Care Plan Details Patient Name: Date of Service: [name]. 12/30/2021 9:45 A M Medical Record Number: [id number] Patient Account Number: [id number] Date of Birth/Sex: Treating RN: [birth year] (61 y.o. [name], [name] Primary Care Martese Vanatta: Bernardo Heater Other Clinician: Massie Kluver Referring Kelliann Pendergraph: Treating Kinsleigh Ludolph/Extender: [name]: 7 Active Inactive Necrotic Tissue Nursing Diagnoses: BRENAN, MODESTO ([id number]) [phone].[id number] Page 5  of 9 Impaired tissue integrity related to necrotic/devitalized tissue Knowledge deficit related to management of necrotic/devitalized tissue Goals: Necrotic/devitalized tissue will be minimized in the wound bed Date Initiated: 11/11/2021 Target Resolution Date: 11/11/2021 Goal Status: Active Patient/caregiver will verbalize understanding of reason and process for debridement of necrotic tissue Date Initiated: 11/11/2021 Date Inactivated: 12/02/2021 Target Resolution Date: 11/11/2021 Goal Status: Met Interventions: Assess patient pain level pre-, during and post procedure and prior to discharge Provide education on necrotic tissue and debridement process Treatment Activities: Excisional debridement : 11/11/2021 Notes: Nutrition Nursing Diagnoses: Potential for alteratiion in Nutrition/Potential for imbalanced nutrition Goals: Patient/caregiver verbalizes understanding of need to maintain therapeutic glucose control per primary care physician Date Initiated: 11/07/2021 Target Resolution  Date: 12/08/2021 Goal Status: Active Interventions: Assess HgA1c results as ordered upon admission and as needed Assess patient nutrition upon admission and as needed per policy Provide education on elevated blood sugars and impact on wound healing Provide education on nutrition Treatment Activities: Education provided on Nutrition : 11/07/2021 Notes: Wound/Skin Impairment Nursing Diagnoses: Knowledge deficit related to ulceration/compromised skin integrity Goals: Patient/caregiver will verbalize understanding of skin care regimen Date Initiated: 11/07/2021 Target Resolution Date: 12/08/2021 Goal Status: Active Ulcer/skin breakdown will have a volume reduction of 30% by week 4 Date Initiated: 11/07/2021 Target Resolution Date: 01/07/2022 Goal Status: Active Ulcer/skin breakdown will have a volume reduction of 50% by week 8 Date Initiated: 11/07/2021 Target Resolution Date: 02/07/2022 Goal Status: Active Ulcer/skin breakdown will have a volume reduction of 80% by week 12 Date Initiated: 11/07/2021 Target Resolution Date: 03/10/2022 Goal Status: Active Ulcer/skin breakdown will heal within 14 weeks Date Initiated: 11/07/2021 Target Resolution Date: 04/15/2022 Goal Status: Active Interventions: Assess patient/caregiver ability to obtain necessary supplies Assess patient/caregiver ability to perform ulcer/skin care regimen upon admission and as needed Assess ulceration(s) every visit Notes: Electronic Signature(s) Signed: 12/30/2021 5:11:01 PM By: Massie Kluver Signed: 12/31/2021 1:12:02 PM By: Gretta Cool, BSN, RN, CWS, Kim RN, BSN Entered By: Massie Kluver on 12/30/2021 10:18:14 [name], [name] ([id number]) [id number] Page 6 of 9 -------------------------------------------------------------------------------- Pain Assessment Details Patient Name: Date of Service: [name], [name] 12/30/2021 9:45 A M Medical Record Number: [id number] Patient Account Number:  [id number] Date of Birth/Sex: Treating RN: [birth year] (61 y.o. [name] Primary Care Erisha Paugh: Bernardo Heater Other Clinician: Massie Kluver Referring Vernel Langenderfer: Treating Dwon Sky/Extender: [name]: 7 Active Problems Location of Pain Severity and Description of Pain Patient Has Paino No Site Locations Pain Management and Medication Current Pain Management: Electronic Signature(s) Signed: 12/30/2021 5:11:01 PM By: Massie Kluver Signed: 12/31/2021 1:12:02 PM By: Gretta Cool, BSN, RN, CWS, Kim RN, BSN Entered By: Massie Kluver on 12/30/2021 10:12:07 -------------------------------------------------------------------------------- Patient/Caregiver Education Details Patient Name: Date of Service: [name] 11/27/2023andnbsp9:45 [place] Record Number: [id number] Patient Account Number: [id number] Date of Birth/Gender: Treating RN: February 08, 1960 (61 y.o. [name] Primary Care Physician: Bernardo Heater Other Clinician: Massie Kluver Referring Physician: Treating Physician/Extender: [name]: [place], [place] ([id number]) [id number] Page 7 of 9 Education Assessment Education Provided To: Patient Education Topics Provided Wound/Skin Impairment: Handouts: Other: continue wound care as directed Methods: Explain/Verbal Responses: State content correctly Electronic Signature(s) Signed: 12/30/2021 5:11:01 PM By: Massie Kluver Entered By: Massie Kluver on 12/30/2021 11:02:50 -------------------------------------------------------------------------------- Wound Assessment Details Patient Name: Date of Service: [name], [name] 12/30/2021 9:45 A M Medical Record Number: [id number] Patient Account Number: [id number] Date of Birth/Sex: Treating RN: November 23, 1960 (61 y.o. [name], [name]  Primary Care Deylan Canterbury: Bernardo Heater Other Clinician: Massie Kluver Referring Conor Lata: Treating Tiphany Fayson/Extender: [name]: 7 Wound Status Wound Number: 1 Primary Diabetic Wound/Ulcer of the Lower Extremity Etiology: Wound Location: Right, Plantar T Great oe Wound Open Wounding Event: Gradually Appeared Status: Date Acquired: 02/15/2020 Comorbid Coronary Artery Disease, Hypertension, Peripheral Venous Weeks Of Treatment: 7 History: Disease, Type II Diabetes Clustered Wound: No Photos Wound Measurements Length: (cm) 0.4 Width: (cm) 0.4 Depth: (cm) 0.1 Area: (cm) 0.126 Volume: (cm) 0.013 % Reduction in Area: 63.6% % Reduction in Volume: 87.5% Epithelialization: None Wound Description Classification: Grade 2 Exudate Amount: Medium Exudate Type: Serosanguineous [name], [name] ([id number]) Exudate Color: red, brown Foul Odor After Cleansing: No Slough/Fibrino Yes [id number] Page 8 of 9 Wound Bed Granulation Amount: Medium (34-66%) Exposed Structure Granulation Quality: Pink Fascia Exposed: No Necrotic Amount: Medium (34-66%) Fat Layer (Subcutaneous Tissue) Exposed: Yes Necrotic Quality: Adherent Slough Tendon Exposed: No Muscle Exposed: No Joint Exposed: No Bone Exposed: No Treatment Notes Wound #1 (Toe Great) Wound Laterality: Plantar, Right Cleanser Wound Cleanser Discharge Instruction: Wash your hands with soap and water. Remove old dressing, discard into plastic bag and place into trash. Cleanse the wound with Wound Cleanser prior to applying a clean dressing using gauze sponges, not tissues or cotton balls. Do not scrub or use excessive force. Pat dry using gauze sponges, not tissue or cotton balls. Peri-Wound Care Topical Primary Dressing Prisma 4.34 (in) Discharge Instruction: Moisten w/normal saline or sterile water; Cover wound as directed. Do not remove from wound bed. Secondary Dressing Gauze Secured With Medipore T - 50M Medipore H Soft  Cloth Surgical T ape ape, 2x2 (in/yd) Compression Wrap Compression Stockings Add-Ons Electronic Signature(s) Signed: 12/30/2021 5:11:01 PM By: Massie Kluver Signed: 12/31/2021 1:12:02 PM By: Gretta Cool, BSN, RN, CWS, Kim RN, BSN Entered By: Massie Kluver on 12/30/2021 10:17:55 -------------------------------------------------------------------------------- Vitals Details Patient Name: Date of Service: [name]. 12/30/2021 9:45 A M Medical Record Number: [id number] Patient Account Number: [id number] Date of Birth/Sex: Treating RN: December 11, 1960 (61 y.o. [name], [name] Primary Care Sapphira Harjo: Bernardo Heater Other Clinician: Massie Kluver Referring Kinzly Pierrelouis: Treating Jocelynne Duquette/Extender: [name]: 7 Vital Signs Time Taken: 10:11 Temperature (F): 97.6 Height (in): 77 Pulse (bpm): 60 Weight (lbs): 262 Respiratory Rate (breaths/min): 18 Body Mass Index (BMI): 31.1 Blood Pressure (mmHg): 164/84 Reference Range: 80 - 120 mg / dl [name], [name] ([id number]) [id number] Page 9 of 9 Electronic Signature(s) Signed: 12/30/2021 5:11:01 PM By: Massie Kluver Entered By: Massie Kluver on 12/30/2021 10:12:03

## 2022-01-02 ENCOUNTER — Encounter: Payer: HMO | Admitting: Physician Assistant

## 2022-01-02 DIAGNOSIS — E11621 Type 2 diabetes mellitus with foot ulcer: Secondary | ICD-10-CM | POA: Diagnosis not present

## 2022-01-02 NOTE — Progress Notes (Addendum)
[name], [name] ([id number]) [id number] Page 1 of 9 Visit Report for 01/02/2022 Arrival Information Details Patient Name: Date of Service: [name], [name] 01/02/2022 9:45 A M Medical Record Number: [id number] Patient Account Number: [id number] Date of Birth/Sex: Treating RN: [birth year] (61 y.o. [name], [name] Primary Care Mychal Durio: Bernardo Heater Other Clinician: Massie Kluver Referring Shayan Bramhall: Treating Briseida Gittings/Extender: [name]: 8 Visit Information History Since Last Visit All ordered tests and consults were completed: No Patient Arrived: Ambulatory Added or deleted any medications: No Arrival Time: 09:48 Any new allergies or adverse reactions: No Transfer Assistance: None Had a fall or experienced change in No Patient Identification Verified: Yes activities of daily living that may affect Secondary Verification Process Completed: Yes risk of falls: Patient Requires Transmission-Based Precautions: No Signs or symptoms of abuse/neglect since last visito No Patient Has Alerts: Yes Hospitalized since last visit: No Patient Alerts: 09/05/21 right 1.26 Implantable device outside of the clinic excluding No 09/05/21 left 1.10 cellular tissue based products placed in the center since last visit: Pain Present Now: No Electronic Signature(s) Signed: 01/02/2022 4:33:44 PM By: Massie Kluver Entered By: Massie Kluver on 01/02/2022 09:54:54 -------------------------------------------------------------------------------- Clinic Level of Care Assessment Details Patient Name: Date of Service: [name], [name] 01/02/2022 9:45 A M Medical Record Number: [id number] Patient Account Number: [id number] Date of Birth/Sex: Treating RN: [birth year] (61 y.o. Verl Blalock Primary Care Fawnda Vitullo: Bernardo Heater Other Clinician: Massie Kluver Referring Biagio Snelson: Treating Gesselle Fitzsimons/Extender: Elbert Ewings Weeks  in Treatment: 8 Clinic Level of Care Assessment Items TOOL 1 Quantity Score _0  - 0 Use when EandM and Procedure is performed on INITIAL visit ASSESSMENTS - Nursing Assessment / Reassessment _1  - 0 General Physical Exam (combine w/ comprehensive assessment (listed just below) when performed on new pt. evals) _2  - 0 Comprehensive Assessment (HX, ROS, Risk Assessments, Wounds Hx, etc.) Touch, Uthman [name] ([id number]) [phone].pdf Page 2 of 9 ASSESSMENTS - Wound and Skin Assessment / Reassessment _3  - 0 Dermatologic / Skin Assessment (not related to wound area) ASSESSMENTS - Ostomy and/or Continence Assessment and Care _4  - 0 Incontinence Assessment and Management _5  - 0 Ostomy Care Assessment and Management (repouching, etc.) PROCESS - Coordination of Care _6  - 0 Simple Patient / Family Education for ongoing care _7  - 0 Complex (extensive) Patient / Family Education for ongoing care _8  - 0 Staff obtains Programmer, systems, Records, T Results / Process Orders est _9  - 0 Staff telephones HHA, Nursing Homes / Clarify orders / etc _10  - 0 Routine Transfer to another Facility (non-emergent condition) _11  - 0 Routine Hospital Admission (non-emergent condition) _12  - 0 New Admissions / Biomedical engineer / Ordering NPWT Apligraf, etc. , _13  - 0 Emergency Hospital Admission (emergent condition) PROCESS - Special Needs _14  - 0 Pediatric / Minor Patient Management _15  - 0 Isolation Patient Management _16  - 0 Hearing / Language / Visual special needs _17  - 0 Assessment of Community assistance (transportation, D/C planning, etc.) _18  - 0 Additional assistance / Altered mentation _19  - 0 Support Surface(s) Assessment (bed, cushion, seat, etc.) INTERVENTIONS - Miscellaneous _20  - 0 External ear exam _21  - 0 Patient Transfer (multiple staff / Civil Service fast streamer / Similar devices) _22  - 0 Simple Staple / Suture removal (25 or less) _23  - 0 Complex Staple / Suture removal (26 or  more) _24  - 0 Hypo/Hyperglycemic Management (do not check if billed separately) _25  - 0 Ankle / Brachial Index (ABI) - do not check if billed separately Has  the patient been seen at the hospital within the last three years: Yes Total Score: 0 Level Of Care: ____ Electronic Signature(s) Signed: 01/02/2022 4:33:44 PM By: Massie Kluver Entered By: Massie Kluver on 01/02/2022 10:36:46 -------------------------------------------------------------------------------- Encounter Discharge Information Details Patient Name: Date of Service: [name]. 01/02/2022 9:45 A M Medical Record Number: [id number] Patient Account Number: [id number] Date of Birth/Sex: Treating RN: [birth year] (61 y.o. Verl Blalock Primary Care Jeslie Lowe: Bernardo Heater Other Clinician: Massie Kluver Referring Morse Brueggemann: Treating Eliott Amparan/Extender: [name]: Silex, [name] ([id number]) [id number] Page 3 of 9 Encounter Discharge Information Items Discharge Condition: Stable Ambulatory Status: Ambulatory Discharge Destination: Home Transportation: Private Auto Accompanied By: wife Schedule Follow-up Appointment: Yes Clinical Summary of Care: Electronic Signature(s) Signed: 01/02/2022 4:33:44 PM By: Massie Kluver Entered By: Massie Kluver on 01/02/2022 10:38:03 -------------------------------------------------------------------------------- Lower Extremity Assessment Details Patient Name: Date of Service: [name], [name] 01/02/2022 9:45 A M Medical Record Number: [id number] Patient Account Number: [id number] Date of Birth/Sex: Treating RN: 1960/11/19 (61 y.o. Verl Blalock Primary Care Marykathryn Carboni: Bernardo Heater Other Clinician: Massie Kluver Referring Jinx Gilden: Treating Stanton Kissoon/Extender: [name]: 8 Electronic Signature(s) Signed: 01/02/2022 4:33:44 PM By: Massie Kluver Signed: 01/03/2022  2:41:51 PM By: Gretta Cool, BSN, RN, CWS, Kim RN, BSN Entered By: Massie Kluver on 01/02/2022 10:08:25 -------------------------------------------------------------------------------- Multi Wound Chart Details Patient Name: Date of Service: [name] 01/02/2022 9:45 A M Medical Record Number: [id number] Patient Account Number: [id number] Date of Birth/Sex: Treating RN: 04-16-1960 (61 y.o. [name], [name] Primary Care Adilynn Bessey: Bernardo Heater Other Clinician: Massie Kluver Referring Ryley Teater: Treating Jerrie Schussler/Extender: [name]: 8 Vital Signs Height(in): 77 Pulse(bpm): 14 Weight(lbs): 262 Blood Pressure(mmHg): 179/95 Body Mass Index(BMI): 31.1 Temperature(F): 97.7 Respiratory Rate(breaths/min): 18 [name], [name] ([id number]) [1:Photos:] [N/A:N/A] Right, Plantar T Great oe N/A N/A Wound Location: Gradually Appeared N/A N/A Wounding Event: Diabetic Wound/Ulcer of the Lower N/A N/A Primary Etiology: Extremity Coronary Artery Disease, N/A N/A Comorbid History: Hypertension, Peripheral Venous Disease, Type II Diabetes 02/15/2020 N/A N/A Date Acquired: 8 N/A N/A Weeks of Treatment: Open N/A N/A Wound Status: No N/A N/A Wound Recurrence: 0.3x0.2x0.2 N/A N/A Measurements [name] x W x D (cm) 0.047 N/A N/A A (cm) : rea 0.009 N/A N/A Volume (cm) : 86.40% N/A N/A % Reduction in A rea: 91.30% N/A N/A % Reduction in Volume: Grade 2 N/A N/A Classification: Medium N/A N/A Exudate A mount: Serosanguineous N/A N/A Exudate Type: red, brown N/A N/A Exudate Color: Medium (34-66%) N/A N/A Granulation A mount: Pink N/A N/A Granulation Quality: Medium (34-66%) N/A N/A Necrotic A mount: Fat Layer (Subcutaneous Tissue): Yes N/A N/A Exposed Structures: Fascia: No Tendon: No Muscle: No Joint: No Bone: No None N/A N/A Epithelialization: Treatment Notes Electronic Signature(s) Signed: 01/02/2022 4:33:44 PM By: Massie Kluver Entered By: Massie Kluver on 01/02/2022 10:35:33 -------------------------------------------------------------------------------- Multi-Disciplinary Care Plan Details Patient Name: Date of Service: [name]. 01/02/2022 9:45 A M Medical Record Number: [id number] Patient Account Number: [id number] Date of Birth/Sex: Treating RN: 04-03-1960 (61 y.o. Verl Blalock Primary Care Deondra Labrador: Bernardo Heater Other Clinician: Massie Kluver Referring Lenor Provencher: Treating Lanasia Porras/Extender: [name]: 8 Active Inactive Necrotic Tissue Nursing Diagnoses: Impaired tissue integrity related to necrotic/devitalized tissue [name], [name] ([id number]) [phone].pdf Page 5 of 9 Knowledge deficit related to management of necrotic/devitalized tissue Goals: Necrotic/devitalized tissue will be minimized in the wound bed Date Initiated: 11/11/2021 Target  Resolution Date: 11/11/2021 Goal Status: Active Patient/caregiver will verbalize understanding of reason and process for debridement of necrotic tissue Date Initiated: 11/11/2021 Date Inactivated: 12/02/2021 Target Resolution Date: 11/11/2021 Goal Status: Met Interventions: Assess patient pain level pre-, during and post procedure and prior to discharge Provide education on necrotic tissue and debridement process Treatment Activities: Excisional debridement : 11/11/2021 Notes: Nutrition Nursing Diagnoses: Potential for alteratiion in Nutrition/Potential for imbalanced nutrition Goals: Patient/caregiver verbalizes understanding of need to maintain therapeutic glucose control per primary care physician Date Initiated: 11/07/2021 Target Resolution Date: 12/08/2021 Goal Status: Active Interventions: Assess HgA1c results as ordered upon admission and as needed Assess patient nutrition upon admission and as needed per policy Provide education on elevated blood sugars and impact on  wound healing Provide education on nutrition Treatment Activities: Education provided on Nutrition : 12/02/2021 Notes: Wound/Skin Impairment Nursing Diagnoses: Knowledge deficit related to ulceration/compromised skin integrity Goals: Patient/caregiver will verbalize understanding of skin care regimen Date Initiated: 11/07/2021 Target Resolution Date: 12/08/2021 Goal Status: Active Ulcer/skin breakdown will have a volume reduction of 30% by week 4 Date Initiated: 11/07/2021 Target Resolution Date: 01/07/2022 Goal Status: Active Ulcer/skin breakdown will have a volume reduction of 50% by week 8 Date Initiated: 11/07/2021 Target Resolution Date: 02/07/2022 Goal Status: Active Ulcer/skin breakdown will have a volume reduction of 80% by week 12 Date Initiated: 11/07/2021 Target Resolution Date: 03/10/2022 Goal Status: Active Ulcer/skin breakdown will heal within 14 weeks Date Initiated: 11/07/2021 Target Resolution Date: 04/15/2022 Goal Status: Active Interventions: Assess patient/caregiver ability to obtain necessary supplies Assess patient/caregiver ability to perform ulcer/skin care regimen upon admission and as needed Assess ulceration(s) every visit Notes: Electronic Signature(s) Signed: 01/02/2022 4:33:44 PM By: Massie Kluver Signed: 01/03/2022 2:41:51 PM By: Gretta Cool, BSN, RN, CWS, Kim RN, BSN Entered By: Massie Kluver on 01/02/2022 10:08:29 [name], [name] ([id number]) [id number] Page 6 of 9 -------------------------------------------------------------------------------- Pain Assessment Details Patient Name: Date of Service: [name], [name] 01/02/2022 9:45 A M Medical Record Number: [id number] Patient Account Number: [id number] Date of Birth/Sex: Treating RN: 23-Jun-1960 (61 y.o. Verl Blalock Primary Care Verl Kitson: Bernardo Heater Other Clinician: Massie Kluver Referring Eustace Hur: Treating Oda Placke/Extender: Elbert Ewings Weeks in  Treatment: 8 Active Problems Location of Pain Severity and Description of Pain Patient Has Paino No Site Locations Pain Management and Medication Current Pain Management: Electronic Signature(s) Signed: 01/02/2022 4:33:44 PM By: Massie Kluver Signed: 01/03/2022 2:41:51 PM By: Gretta Cool, BSN, RN, CWS, Kim RN, BSN Entered By: Massie Kluver on 01/02/2022 09:57:15 -------------------------------------------------------------------------------- Patient/Caregiver Education Details Patient Name: Date of Service: [name] 11/30/2023andnbsp9:45 [place] Record Number: [id number] Patient Account Number: [id number] Date of Birth/Gender: Treating RN: 1960/05/13 (61 y.o. Verl Blalock Primary Care Physician: Bernardo Heater Other Clinician: Massie Kluver Referring Physician: Treating Physician/Extender: [name]: [name], [name] ([id number]) [id number] Page 7 of 9 Education Assessment Education Provided To: Patient Education Topics Provided Wound/Skin Impairment: Handouts: Other: continue wound care as directed Methods: Explain/Verbal Responses: State content correctly Electronic Signature(s) Signed: 01/02/2022 4:33:44 PM By: Massie Kluver Entered By: Massie Kluver on 01/02/2022 10:37:23 -------------------------------------------------------------------------------- Wound Assessment Details Patient Name: Date of Service: [name] 01/02/2022 9:45 A M Medical Record Number: [id number] Patient Account Number: [id number] Date of Birth/Sex: Treating RN: 1960-03-17 (61 y.o. Verl Blalock Primary Care Taylor Spilde: Bernardo Heater Other Clinician: Massie Kluver Referring Ankit Degregorio: Treating Gionna Polak/Extender: [name]: 8 Wound Status Wound Number: 1 Primary Diabetic Wound/Ulcer of the  Lower Extremity Etiology: Wound Location: Right, Plantar T Great oe Wound  Open Wounding Event: Gradually Appeared Status: Date Acquired: 02/15/2020 Comorbid Coronary Artery Disease, Hypertension, Peripheral Venous Weeks Of Treatment: 8 History: Disease, Type II Diabetes Clustered Wound: No Photos Wound Measurements Length: (cm) 0.3 Width: (cm) 0.2 Depth: (cm) 0.2 Area: (cm) 0.047 Volume: (cm) 0.009 % Reduction in Area: 86.4% % Reduction in Volume: 91.3% Epithelialization: None Tunneling: No Undermining: No Wound Description Classification: Grade 2 Exudate Amount: Medium Exudate Type: Serosanguineous [name], [name] ([id number]) Exudate Color: red, brown Foul Odor After Cleansing: No Slough/Fibrino Yes [id number].pdf Page 8 of 9 Wound Bed Granulation Amount: Medium (34-66%) Exposed Structure Granulation Quality: Pink Fascia Exposed: No Necrotic Amount: Medium (34-66%) Fat Layer (Subcutaneous Tissue) Exposed: Yes Necrotic Quality: Adherent Slough Tendon Exposed: No Muscle Exposed: No Joint Exposed: No Bone Exposed: No Treatment Notes Wound #1 (Toe Great) Wound Laterality: Plantar, Right Cleanser Wound Cleanser Discharge Instruction: Wash your hands with soap and water. Remove old dressing, discard into plastic bag and place into trash. Cleanse the wound with Wound Cleanser prior to applying a clean dressing using gauze sponges, not tissues or cotton balls. Do not scrub or use excessive force. Pat dry using gauze sponges, not tissue or cotton balls. Peri-Wound Care Topical Primary Dressing Prisma 4.34 (in) Discharge Instruction: Moisten w/normal saline or sterile water; Cover wound as directed. Do not remove from wound bed. Secondary Dressing Gauze Secured With Medipore T - 31M Medipore H Soft Cloth Surgical T ape ape, 2x2 (in/yd) Compression Wrap Compression Stockings Add-Ons Electronic Signature(s) Signed: 01/02/2022 4:33:44 PM By: Massie Kluver Signed: 01/03/2022 2:41:51 PM By: Gretta Cool, BSN, RN, CWS, Kim  RN, BSN Entered By: Massie Kluver on 01/02/2022 10:08:05 -------------------------------------------------------------------------------- Vitals Details Patient Name: Date of Service: [name]. 01/02/2022 9:45 A M Medical Record Number: [id number] Patient Account Number: [id number] Date of Birth/Sex: Treating RN: 20-Nov-1960 (61 y.o. [name], [name] Primary Care Jaikob Borgwardt: Bernardo Heater Other Clinician: Massie Kluver Referring Rashaad Hallstrom: Treating Roxy Mastandrea/Extender: [name]: 8 Vital Signs Time Taken: 09:55 Temperature (F): 97.7 Height (in): 77 Pulse (bpm): 59 Weight (lbs): 262 Respiratory Rate (breaths/min): 18 Body Mass Index (BMI): 31.1 Blood Pressure (mmHg): 179/95 Reference Range: 80 - 120 mg / dl [name], [name] ([id number]) [id number] Page 9 of 9 Notes Patient states "If my blood pressure is high, its due to my wifes driving" Electronic Signature(s) Signed: 01/02/2022 4:33:44 PM By: Massie Kluver Entered By: Massie Kluver on 01/02/2022 09:57:10

## 2022-01-02 NOTE — Progress Notes (Addendum)
[name] ([id number]) [id number] Page 1 of 7 Visit Report for 01/02/2022 Chief Complaint Document Details Patient Name: Date of Service: [name], [name] 01/02/2022 9:45 A M Medical Record Number: [id number] Patient Account Number: [id number] Date of Birth/Sex: Treating RN: October 06, 1960 (61 y.o. Verl Blalock Primary Care Provider: Bernardo Heater Other Clinician: Massie Kluver Referring Provider: Treating Provider/Extender: [name]: 8 Information Obtained from: Patient Chief Complaint Right great toe ulcer Electronic Signature(s) Signed: 01/02/2022 9:27:43 AM By: Worthy Keeler PA-C Entered By: Worthy Keeler on 01/02/2022 09:27:43 -------------------------------------------------------------------------------- HPI Details Patient Name: Date of Service: [name] 01/02/2022 9:45 A M Medical Record Number: [id number] Patient Account Number: [id number] Date of Birth/Sex: Treating RN: [birth year] (61 y.o. Verl Blalock Primary Care Provider: Bernardo Heater Other Clinician: Massie Kluver Referring Provider: Treating Provider/Extender: [name]: 8 History of Present Illness HPI Description: 11-07-2021 upon evaluation today patient appears to be doing somewhat poorly in regard to a wound on his right plantar great toe. Fortunately there does not appear to be any evidence of active infection locally or systemically at this time which is great news. No fevers, chills, nausea, vomiting, or diarrhea. With that being said he tells me that he really has not been noticing a lot of improvement either with regard to the treatment. He has been under the care of Dr. Earleen Newport up to this point for roughly a year. More recently he did have an MRI which revealed the potential for possible osteomyelitis. With that being said I do believe that he definitely should probably be  monitored for this but to be honest the Elesa Hacker that he was given probably did a good job. Considering there was not much being seen on MRI I do not think there is a high likelihood of severe osteomyelitis at this point we will definitely keep an eye on things however. Nonetheless right now has been utilizing Silvadene cream I definitely think we can find something better to help get this moving in the right direction. Patient does have a history of diabetes mellitus type 2, chronic kidney disease stage III, hypertension, and again he has had an amputation of the left leg currently. The right leg is the 1 that we are actually treating the great toe wound on. He has a below-knee amputation. 11-11-2021 upon evaluation today patient's wound actually is showing signs of excellent improvement. I am actually very pleased even compared to last week this is already better. He is very happy as well. 12-02-2021 upon evaluation today patient appears to be doing pretty well in regard to his toe ulcer this been several weeks since I saw him part of it was our scheduling with me being out of town and part of it was he actually missed an appointment secondary to his trunk the will actually fell off and he had some Wenz, Briant L ([id number]) [id number] Page 2 of 7 issues there getting it fixed on Monday so he missed his appointment last Monday. Nonetheless the good news is in today things appear to be doing well no signs of infection though he does have a pretty good need for sharp debridement to try to clear some of this away. 11/6; right first toes ulcer complicated by his left-sided BKA.Marland KitchenNo major change from last visit 12-16-2021 upon evaluation today patient appears to be doing well currently in regard to his toe ulcer that was not making a good amount of improvement yet  I do believe the total contact cast is really good to be necessary. I talked about that before he tells me his  wife can drive him around he is done with working now also he can actually look towards doing this. 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 12-30-2021 upon evaluation today patient appears to be doing well currently in regard to his foot ulcer on the toe although he is still building up quite a bit of callus. Will actually get the total contact cast on him today and started which I think is good to be very beneficial. 01-02-2022 upon evaluation today patient appears to be doing much better in regard to his wound. The cast does seem to do excellent for him. Fortunately there does not appear to be any evidence of active infections at this time. Electronic Signature(s) Signed: 01/02/2022 10:42:33 AM By: Worthy Keeler PA-C Entered By: Worthy Keeler on 01/02/2022 10:42:33 -------------------------------------------------------------------------------- Physical Exam Details Patient Name: Date of Service: [name] 01/02/2022 9:45 A M Medical Record Number: [id number] Patient Account Number: [id number] Date of Birth/Sex: Treating RN: 08/05/1960 (61 y.o. Verl Blalock Primary Care Provider: Bernardo Heater Other Clinician: Massie Kluver Referring Provider: Treating Provider/Extender: [name]: 8 Constitutional Well-nourished and well-hydrated in no acute distress. Respiratory normal breathing without difficulty. Psychiatric this patient is able to make decisions and demonstrates good insight into disease process. Alert and Oriented x 3. pleasant and cooperative. Notes Upon inspection patient's wound bed actually showed signs of good granulation epithelization at this point. There did not appear to be any need for sharp debridement substantially I think that we are going to put the total contact cast back in place. Electronic  Signature(s) Signed: 01/02/2022 10:42:48 AM By: Worthy Keeler PA-C Entered By: Worthy Keeler on 01/02/2022 10:42:47 -------------------------------------------------------------------------------- Physician Orders Details Patient Name: Date of Service: KASSIUS, BATTISTE 01/02/2022 9:45 A Myer Haff, Janoah L ([id number]) [id number] Page 3 of 7 Medical Record Number: [id number] Patient Account Number: [id number] Date of Birth/Sex: Treating RN: 02/14/1960 (61 y.o. Verl Blalock Primary Care Provider: Bernardo Heater Other Clinician: Massie Kluver Referring Provider: Treating Provider/Extender: [name]: 8 Verbal / Phone Orders: No Diagnosis Coding ICD-10 Coding Code Description E11.621 Type 2 diabetes mellitus with foot ulcer L97.512 Non-pressure chronic ulcer of other part of right foot with fat layer exposed N18.30 Chronic kidney disease, stage 3 unspecified I10 Essential (primary) hypertension Follow-up Appointments Return Appointment in 1 week. Bathing/ Shower/ Hygiene May shower; gently cleanse wound with antibacterial soap, rinse and pat dry prior to dressing wounds No tub bath. - Don't get cast wet Anesthetic (Use 'Patient Medications' Section for Anesthetic Order Entry) Lidocaine applied to wound bed Edema Control - Lymphedema / Segmental Compressive Device / Other Elevate, Exercise Daily and A void Standing for Long Periods of Time. Elevate legs to the level of the heart and pump ankles as often as possible Elevate leg(s) parallel to the floor when sitting. Off-Loading Total Contact Cast to Right Lower Extremity - TCC #2 applied Removable cast walker boot Other: - foam pad to cover toe Wound Treatment Wound #1 - T Great oe Wound Laterality: Plantar, Right Cleanser: Wound Cleanser 3 x Per Week Discharge Instructions: Wash your hands with soap and water. Remove old dressing, discard into plastic  bag  and place into trash. Cleanse the wound with Wound Cleanser prior to applying a clean dressing using gauze sponges, not tissues or cotton balls. Do not scrub or use excessive force. Pat dry using gauze sponges, not tissue or cotton balls. Prim Dressing: Prisma 4.34 (in) 3 x Per Week ary Discharge Instructions: Moisten w/normal saline or sterile water; Cover wound as directed. Do not remove from wound bed. Secondary Dressing: Gauze 3 x Per Week Secured With: Medipore T - 13M Medipore H Soft Cloth Surgical T ape ape, 2x2 (in/yd) 3 x Per Week Electronic Signature(s) Signed: 01/02/2022 4:33:44 PM By: Massie Kluver Signed: 01/02/2022 5:46:39 PM By: Worthy Keeler PA-C Entered By: Massie Kluver on 01/02/2022 10:36:40 -------------------------------------------------------------------------------- Problem List Details Patient Name: Date of Service: [name]. 01/02/2022 9:45 A M Medical Record Number: [id number] Patient Account Number: [id number] Date of Birth/Sex: Treating RN: Nov 25, 1960 (61 y.o. Verl Blalock [place], [place] ([id number]) [phone].pdf Page 4 of 7 Primary Care Provider: Bernardo Heater Other Clinician: Massie Kluver Referring Provider: Treating Provider/Extender: [name]: 8 Active Problems ICD-10 Encounter Code Description Active Date MDM Diagnosis E11.621 Type 2 diabetes mellitus with foot ulcer 11/07/2021 No Yes L97.512 Non-pressure chronic ulcer of other part of right foot with fat layer exposed 11/07/2021 No Yes N18.30 Chronic kidney disease, stage 3 unspecified 11/07/2021 No Yes I10 Essential (primary) hypertension 11/07/2021 No Yes Inactive Problems Resolved Problems Electronic Signature(s) Signed: 01/02/2022 9:27:41 AM By: Worthy Keeler PA-C Entered By: Worthy Keeler on 01/02/2022 09:27:40 -------------------------------------------------------------------------------- Progress Note  Details Patient Name: Date of Service: [name]. 01/02/2022 9:45 A M Medical Record Number: [id number] Patient Account Number: [id number] Date of Birth/Sex: Treating RN: 24-Jan-1961 (61 y.o. Verl Blalock Primary Care Provider: Bernardo Heater Other Clinician: Massie Kluver Referring Provider: Treating Provider/Extender: [name]: 8 Subjective Chief Complaint Information obtained from Patient Right great toe ulcer History of Present Illness (HPI) 11-07-2021 upon evaluation today patient appears to be doing somewhat poorly in regard to a wound on his right plantar great toe. Fortunately there does not appear to be any evidence of active infection locally or systemically at this time which is great news. No fevers, chills, nausea, vomiting, or diarrhea. With that being said he tells me that he really has not been noticing a lot of improvement either with regard to the treatment. He has been under the care of Dr. Earleen Newport up to this point for roughly a year. More recently he did have an MRI which revealed the potential for possible osteomyelitis. With that being said I do believe that he definitely should probably be monitored for this but to be honest the Elesa Hacker that he was given probably did a good job. Considering there was not much being seen on MRI I do not think there is a high likelihood of severe osteomyelitis at this point we will definitely keep an eye on things however. Nonetheless right now has been utilizing Silvadene cream I definitely think we can find something better to help get this moving in the right direction. Patient does have a history of diabetes mellitus type 2, chronic kidney disease stage III, hypertension, and again he has had an amputation of the left leg currently. The right leg is the 1 that we are actually treating the great toe wound on. He has a below-knee amputation. 11-11-2021 upon evaluation today patient's wound  actually is showing signs of excellent improvement. I  am actually very pleased even compared to last week CARMIN, DIBARTOLO ([id number]) [id number] Page 5 of 7 this is already better. He is very happy as well. 12-02-2021 upon evaluation today patient appears to be doing pretty well in regard to his toe ulcer this been several weeks since I saw him part of it was our scheduling with me being out of town and part of it was he actually missed an appointment secondary to his trunk the will actually fell off and he had some issues there getting it fixed on Monday so he missed his appointment last Monday. Nonetheless the good news is in today things appear to be doing well no signs of infection though he does have a pretty good need for sharp debridement to try to clear some of this away. 11/6; right first toes ulcer complicated by his left-sided BKA.Marland KitchenNo major change from last visit 12-16-2021 upon evaluation today patient appears to be doing well currently in regard to his toe ulcer that was not making a good amount of improvement yet I do believe the total contact cast is really good to be necessary. I talked about that before he tells me his wife can drive him around he is done with working now also he can actually look towards doing this. 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 12-30-2021 upon evaluation today patient appears to be doing well currently in regard to his foot ulcer on the toe although he is still building up quite a bit of callus. Will actually get the total contact cast on him today and started which I think is good to be very beneficial. 01-02-2022 upon evaluation today patient appears to be doing much better in regard to his wound. The cast does seem to do excellent for him. Fortunately there does not appear to be any evidence of active  infections at this time. Objective Constitutional Well-nourished and well-hydrated in no acute distress. Vitals Time Taken: 9:55 AM, Height: 77 in, Weight: 262 lbs, BMI: 31.1, Temperature: 97.7 F, Pulse: 59 bpm, Respiratory Rate: 18 breaths/min, Blood Pressure: 179/95 mmHg. General Notes: Patient states "If my blood pressure is high, its due to my wifes driving" Respiratory normal breathing without difficulty. Psychiatric this patient is able to make decisions and demonstrates good insight into disease process. Alert and Oriented x 3. pleasant and cooperative. General Notes: Upon inspection patient's wound bed actually showed signs of good granulation epithelization at this point. There did not appear to be any need for sharp debridement substantially I think that we are going to put the total contact cast back in place. Integumentary (Hair, Skin) Wound #1 status is Open. Original cause of wound was Gradually Appeared. The date acquired was: 02/15/2020. The wound has been in treatment 8 weeks. The wound is located on the [employer]. The wound measures 0.3cm length x 0.2cm width x 0.2cm depth; 0.047cm^2 area and 0.009cm^3 volume. oe There is Fat Layer (Subcutaneous Tissue) exposed. There is no tunneling or undermining noted. There is a medium amount of serosanguineous drainage noted. There is medium (34-66%) pink granulation within the wound bed. There is a medium (34-66%) amount of necrotic tissue within the wound bed including Adherent Slough. Assessment Active Problems ICD-10 Type 2 diabetes mellitus with foot ulcer Non-pressure chronic ulcer of other part of right foot with fat layer exposed Chronic kidney disease, stage  3 unspecified Essential (primary) hypertension Procedures Wound #1 Pre-procedure diagnosis of Wound #1 is a Diabetic Wound/Ulcer of the Lower Extremity located on the Right,Plantar T Great . There was a T Contact oe otal Cast Procedure by Tommie Sams.,  PA-C. Post procedure Diagnosis Wound #1: Same as Pre-Procedure Plan CLEOPHUS, MENDONSA ([id number]) [id number] Page 6 of 7 Follow-up Appointments: Return Appointment in 1 week. Bathing/ Shower/ Hygiene: May shower; gently cleanse wound with antibacterial soap, rinse and pat dry prior to dressing wounds No tub bath. - Don't get cast wet Anesthetic (Use 'Patient Medications' Section for Anesthetic Order Entry): Lidocaine applied to wound bed Edema Control - Lymphedema / Segmental Compressive Device / Other: Elevate, Exercise Daily and Avoid Standing for Long Periods of Time. Elevate legs to the level of the heart and pump ankles as often as possible Elevate leg(s) parallel to the floor when sitting. Off-Loading: T Contact Cast to Right Lower Extremity - TCC #2 applied otal Removable cast walker boot Other: - foam pad to cover toe WOUND #1: - T Great Wound Laterality: Plantar, Right oe Cleanser: Wound Cleanser 3 x Per Week/ Discharge Instructions: Wash your hands with soap and water. Remove old dressing, discard into plastic bag and place into trash. Cleanse the wound with Wound Cleanser prior to applying a clean dressing using gauze sponges, not tissues or cotton balls. Do not scrub or use excessive force. Pat dry using gauze sponges, not tissue or cotton balls. Prim Dressing: Prisma 4.34 (in) 3 x Per Week/ ary Discharge Instructions: Moisten w/normal saline or sterile water; Cover wound as directed. Do not remove from wound bed. Secondary Dressing: Gauze 3 x Per Week/ Secured With: Medipore T - 38M Medipore H Soft Cloth Surgical T ape ape, 2x2 (in/yd) 3 x Per Week/ 1. I would recommend that we have the patient going continue with total contact casting. We will get this going for him again today it seems to be doing excellent and very pleased with where things stand. 2. I am good recommend as well that we have the patient continue to monitor for any evidence  of infection or worsening in general obviously we are using the silver collagen right now which I think is doing a good job we will keep a close eye on things. We will see patient back for reevaluation in 1 week here in the clinic. If anything worsens or changes patient will contact our office for additional recommendations. Electronic Signature(s) Signed: 01/02/2022 10:43:21 AM By: Worthy Keeler PA-C Entered By: Worthy Keeler on 01/02/2022 10:43:21 -------------------------------------------------------------------------------- Total Contact Cast Details Patient Name: Date of Service: BRANDY, KABAT 01/02/2022 9:45 A M Medical Record Number: [id number] Patient Account Number: [id number] Date of Birth/Sex: Treating RN: [birth year] (61 y.o. [name], [name] Primary Care Provider: Bernardo Heater Other Clinician: Massie Kluver Referring Provider: Treating Provider/Extender: [name]: 8 T Contact Cast Applied for Wound Assessment: otal Wound #1 Right,Plantar T Great oe Performed By: Physician Tommie Sams., PA-C Post Procedure Diagnosis Same as Pre-procedure Electronic Signature(s) Signed: 01/02/2022 4:33:44 PM By: Massie Kluver Signed: 01/02/2022 5:46:39 PM By: Worthy Keeler PA-C Entered By: Massie Kluver on 01/02/2022 10:36:04 Romain, Reynolds Bowl ([id number]) [id number] Page 7 of 7 -------------------------------------------------------------------------------- SuperBill Details Patient Name: Date of Service: JEEVAN, KALLA 01/02/2022 Medical Record Number: [id number] Patient Account Number: [id number] Date of Birth/Sex: Treating RN: 1960/03/14 (61 y.o. Verl Blalock Primary Care Provider: Bernardo Heater Other Clinician: Massie Kluver Referring  Provider: Treating Provider/Extender: [name]: 8 Diagnosis Coding ICD-10 Codes Code Description E11.621 Type 2  diabetes mellitus with foot ulcer L97.512 Non-pressure chronic ulcer of other part of right foot with fat layer exposed N18.30 Chronic kidney disease, stage 3 unspecified I10 Essential (primary) hypertension Facility Procedures : CPT4 Code: [id number] Description: [phone] - APPLY TOTAL CONTACT LEG CAST ICD-10 Diagnosis Description E11.621 Type 2 diabetes mellitus with foot ulcer Modifier: Quantity: 1 Physician Procedures : CPT4 Code Description Modifier [id number] - WC PHYS APPLY TOTAL CONTACT CAST ICD-10 Diagnosis Description E11.621 Type 2 diabetes mellitus with foot ulcer Quantity: 1 Electronic Signature(s) Signed: 01/02/2022 10:43:28 AM By: Worthy Keeler PA-C Entered By: Worthy Keeler on 01/02/2022 10:43:28

## 2022-01-09 ENCOUNTER — Encounter: Payer: HMO | Attending: Physician Assistant | Admitting: Physician Assistant

## 2022-01-09 DIAGNOSIS — L97512 Non-pressure chronic ulcer of other part of right foot with fat layer exposed: Secondary | ICD-10-CM | POA: Diagnosis not present

## 2022-01-09 DIAGNOSIS — I129 Hypertensive chronic kidney disease with stage 1 through stage 4 chronic kidney disease, or unspecified chronic kidney disease: Secondary | ICD-10-CM | POA: Insufficient documentation

## 2022-01-09 DIAGNOSIS — E1151 Type 2 diabetes mellitus with diabetic peripheral angiopathy without gangrene: Secondary | ICD-10-CM | POA: Insufficient documentation

## 2022-01-09 DIAGNOSIS — E1122 Type 2 diabetes mellitus with diabetic chronic kidney disease: Secondary | ICD-10-CM | POA: Insufficient documentation

## 2022-01-09 DIAGNOSIS — Z89512 Acquired absence of left leg below knee: Secondary | ICD-10-CM | POA: Insufficient documentation

## 2022-01-09 DIAGNOSIS — N183 Chronic kidney disease, stage 3 unspecified: Secondary | ICD-10-CM | POA: Insufficient documentation

## 2022-01-09 DIAGNOSIS — E11621 Type 2 diabetes mellitus with foot ulcer: Secondary | ICD-10-CM | POA: Insufficient documentation

## 2022-01-09 NOTE — Progress Notes (Addendum)
[name] ([id number]) [id number] Page 1 of 8 Visit Report for 01/09/2022 Chief Complaint Document Details Patient Name: Date of Service: [name], [name] 01/09/2022 9:45 A M Medical Record Number: [id number] Patient Account Number: [id number] Date of Birth/Sex: Treating RN: [birth year] (61 y.o. Verl Blalock Primary Care Provider: Bernardo Heater Other Clinician: Massie Kluver Referring Provider: Treating Provider/Extender: [name]: 9 Information Obtained from: Patient Chief Complaint Right great toe ulcer Electronic Signature(s) Signed: 01/09/2022 9:32:14 AM By: Worthy Keeler PA-C Entered By: Worthy Keeler on 01/09/2022 09:32:13 -------------------------------------------------------------------------------- Debridement Details Patient Name: Date of Service: [name] 01/09/2022 9:45 A M Medical Record Number: [id number] Patient Account Number: [id number] Date of Birth/Sex: Treating RN: [birth year] (61 y.o. [name], [name] Primary Care Provider: Bernardo Heater Other Clinician: Massie Kluver Referring Provider: Treating Provider/Extender: [name]: 9 Debridement Performed for Assessment: Wound #1 Right,Plantar T Great oe Performed By: Physician Tommie Sams., PA-C Debridement Type: Debridement Severity of Tissue Pre Debridement: Fat layer exposed Level of Consciousness (Pre-procedure): Awake and Alert Pre-procedure Verification/Time Out Yes - 10:10 Taken: Start Time: 10:10 T Area Debrided (L x W): otal 0.2 (cm) x 0.2 (cm) = 0.04 (cm) Tissue and other material debrided: Non-Viable, Eschar, Skin: Dermis , Skin: Epidermis Level: Skin/Epidermis Debridement Description: Selective/Open Wound Instrument: Curette Bleeding: Moderate Hemostasis Achieved: Silver Nitrate Response to Treatment: Procedure was tolerated well Level of Consciousness (Post-  Awake and Alert procedure): Zeiss, Jordyn L ([id number]) [id number] Page 2 of 8 Post Debridement Measurements of Total Wound Length: (cm) 0.1 Width: (cm) 0.1 Depth: (cm) 0.1 Volume: (cm) 0.001 Character of Wound/Ulcer Post Debridement: Stable Severity of Tissue Post Debridement: Fat layer exposed Post Procedure Diagnosis Same as Pre-procedure Electronic Signature(s) Signed: 01/09/2022 4:35:46 PM By: Worthy Keeler PA-C Signed: 01/10/2022 10:21:17 AM By: Massie Kluver Signed: 01/10/2022 11:32:10 AM By: Gretta Cool, BSN, RN, CWS, Kim RN, BSN Entered By: Massie Kluver on 01/09/2022 10:13:36 -------------------------------------------------------------------------------- HPI Details Patient Name: Date of Service: [name]. 01/09/2022 9:45 A M Medical Record Number: [id number] Patient Account Number: [id number] Date of Birth/Sex: Treating RN: 11/11/1960 (61 y.o. Verl Blalock Primary Care Provider: Bernardo Heater Other Clinician: Massie Kluver Referring Provider: Treating Provider/Extender: [name]: 9 History of Present Illness HPI Description: 11-07-2021 upon evaluation today patient appears to be doing somewhat poorly in regard to a wound on his right plantar great toe. Fortunately there does not appear to be any evidence of active infection locally or systemically at this time which is great news. No fevers, chills, nausea, vomiting, or diarrhea. With that being said he tells me that he really has not been noticing a lot of improvement either with regard to the treatment. He has been under the care of Dr. Earleen Newport up to this point for roughly a year. More recently he did have an MRI which revealed the potential for possible osteomyelitis. With that being said I do believe that he definitely should probably be monitored for this but to be honest the Elesa Hacker that he was given probably did a good job. Considering  there was not much being seen on MRI I do not think there is a high likelihood of severe osteomyelitis at this point we will definitely keep an eye on things however. Nonetheless right now has been utilizing Silvadene cream I definitely think we can find something better to help get this moving in the right direction. Patient  does have a history of diabetes mellitus type 2, chronic kidney disease stage III, hypertension, and again he has had an amputation of the left leg currently. The right leg is the 1 that we are actually treating the great toe wound on. He has a below-knee amputation. 11-11-2021 upon evaluation today patient's wound actually is showing signs of excellent improvement. I am actually very pleased even compared to last week this is already better. He is very happy as well. 12-02-2021 upon evaluation today patient appears to be doing pretty well in regard to his toe ulcer this been several weeks since I saw him part of it was our scheduling with me being out of town and part of it was he actually missed an appointment secondary to his trunk the will actually fell off and he had some issues there getting it fixed on Monday so he missed his appointment last Monday. Nonetheless the good news is in today things appear to be doing well no signs of infection though he does have a pretty good need for sharp debridement to try to clear some of this away. 11/6; right first toes ulcer complicated by his left-sided BKA.Marland KitchenNo major change from last visit 12-16-2021 upon evaluation today patient appears to be doing well currently in regard to his toe ulcer that was not making a good amount of improvement yet I do believe the total contact cast is really good to be necessary. I talked about that before he tells me his wife can drive him around he is done with working now also he can actually look towards doing this. 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 12-30-2021 upon evaluation today patient appears to be doing well currently in regard to his foot ulcer on the toe although he is still building up quite a bit of callus. Will actually get the total contact cast on him today and started which I think is good to be very beneficial. 01-02-2022 upon evaluation today patient appears to be doing much better in regard to his wound. The cast does seem to do excellent for him. Fortunately there does not appear to be any evidence of active infections at this time. 01-09-2022 upon evaluation today patient appears to be doing excellent in regard to his wound. He is actually showing signs of excellent improvement and in general I think were very close to complete resolution. In fact there might be just a really tiny area, 0.1, still open but again this is much better than where we were prior to put the cast on. Its only been a little over a week since we put the first cast in place. JAECEON, MICHELIN ([id number]) [id number] Page 3 of 8 Electronic Signature(s) Signed: 01/09/2022 10:26:32 AM By: Worthy Keeler PA-C Entered By: Worthy Keeler on 01/09/2022 10:26:32 -------------------------------------------------------------------------------- Physical Exam Details Patient Name: Date of Service: AREK, SPADAFORE 01/09/2022 9:45 A M Medical Record Number: [id number] Patient Account Number: [id number] Date of Birth/Sex: Treating RN: [birth year] (61 y.o. Verl Blalock Primary Care Provider: Bernardo Heater Other Clinician: Massie Kluver Referring Provider: Treating Provider/Extender: [name]: 9 Constitutional Well-nourished and well-hydrated in no acute distress. Respiratory normal breathing without difficulty. Psychiatric this patient is able to make decisions and demonstrates good insight into disease  process. Alert and Oriented x 3. pleasant and cooperative. Notes  Upon inspection patient's wound did require debridement just clearway some of the callus once removed there was just a tiny pinpoint opening remaining otherwise this appears to be pretty much healed and I think by next week he will be ready for discharge and to be out of the casting. Electronic Signature(s) Signed: 01/09/2022 10:26:48 AM By: Worthy Keeler PA-C Entered By: Worthy Keeler on 01/09/2022 10:26:48 -------------------------------------------------------------------------------- Physician Orders Details Patient Name: Date of Service: [name] 01/09/2022 9:45 A M Medical Record Number: [id number] Patient Account Number: [id number] Date of Birth/Sex: Treating RN: January 01, 1961 (61 y.o. Verl Blalock Primary Care Provider: Bernardo Heater Other Clinician: Massie Kluver Referring Provider: Treating Provider/Extender: [name]: 9 Verbal / Phone Orders: No Diagnosis Coding ICD-10 Coding Code Description E11.621 Type 2 diabetes mellitus with foot ulcer L97.512 Non-pressure chronic ulcer of other part of right foot with fat layer exposed Klimowicz, Liberty ([id number]) [id number] Page 4 of 8 N18.30 Chronic kidney disease, stage 3 unspecified I10 Essential (primary) hypertension Follow-up Appointments Return Appointment in 1 week. Bathing/ Shower/ Hygiene May shower; gently cleanse wound with antibacterial soap, rinse and pat dry prior to dressing wounds No tub bath. - Don't get cast wet Anesthetic (Use 'Patient Medications' Section for Anesthetic Order Entry) Lidocaine applied to wound bed Edema Control - Lymphedema / Segmental Compressive Device / Other Elevate, Exercise Daily and A void Standing for Long Periods of Time. Elevate legs to the level of the heart and pump ankles as often as possible Elevate leg(s) parallel to the floor when  sitting. Off-Loading Total Contact Cast to Right Lower Extremity - TCC #3 applied Removable cast walker boot Other: - foam pad to cover toe Wound Treatment Wound #1 - T Great oe Wound Laterality: Plantar, Right Cleanser: Wound Cleanser 3 x Per Week Discharge Instructions: Wash your hands with soap and water. Remove old dressing, discard into plastic bag and place into trash. Cleanse the wound with Wound Cleanser prior to applying a clean dressing using gauze sponges, not tissues or cotton balls. Do not scrub or use excessive force. Pat dry using gauze sponges, not tissue or cotton balls. Prim Dressing: Prisma 4.34 (in) 3 x Per Week ary Discharge Instructions: Moisten w/normal saline or sterile water; Cover wound as directed. Do not remove from wound bed. Secondary Dressing: Gauze 3 x Per Week Secured With: Medipore T - 48M Medipore H Soft Cloth Surgical T ape ape, 2x2 (in/yd) 3 x Per Week Electronic Signature(s) Signed: 01/09/2022 4:35:46 PM By: Worthy Keeler PA-C Signed: 01/10/2022 10:21:17 AM By: Massie Kluver Entered By: Massie Kluver on 01/09/2022 10:28:04 -------------------------------------------------------------------------------- Problem List Details Patient Name: Date of Service: [name] 01/09/2022 9:45 A M Medical Record Number: [id number] Patient Account Number: [id number] Date of Birth/Sex: Treating RN: 1960-09-26 (61 y.o. Verl Blalock Primary Care Provider: Bernardo Heater Other Clinician: Massie Kluver Referring Provider: Treating Provider/Extender: [name]: 9 Active Problems ICD-10 Encounter Code Description Active Date MDM Diagnosis E11.621 Type 2 diabetes mellitus with foot ulcer 11/07/2021 No Yes Mode, Cole L ([id number]) [id number] Page 5 of 8 [phone] Non-pressure chronic ulcer of other part of right foot with fat layer exposed 11/07/2021 No Yes N18.30 Chronic kidney  disease, stage 3 unspecified 11/07/2021 No Yes I10 Essential (primary) hypertension 11/07/2021 No Yes Inactive Problems Resolved Problems Electronic Signature(s) Signed: 01/09/2022 9:32:08 AM By: Worthy Keeler PA-C Entered By: Worthy Keeler on 01/09/2022 09:32:08 -------------------------------------------------------------------------------- Progress Note  Details Patient Name: Date of Service: EFSTATHIOS, SAWIN 01/09/2022 9:45 A M Medical Record Number: [id number] Patient Account Number: [id number] Date of Birth/Sex: Treating RN: 1960-02-12 (61 y.o. Verl Blalock Primary Care Provider: Bernardo Heater Other Clinician: Massie Kluver Referring Provider: Treating Provider/Extender: [name]: 9 Subjective Chief Complaint Information obtained from Patient Right great toe ulcer History of Present Illness (HPI) 11-07-2021 upon evaluation today patient appears to be doing somewhat poorly in regard to a wound on his right plantar great toe. Fortunately there does not appear to be any evidence of active infection locally or systemically at this time which is great news. No fevers, chills, nausea, vomiting, or diarrhea. With that being said he tells me that he really has not been noticing a lot of improvement either with regard to the treatment. He has been under the care of Dr. Earleen Newport up to this point for roughly a year. More recently he did have an MRI which revealed the potential for possible osteomyelitis. With that being said I do believe that he definitely should probably be monitored for this but to be honest the Elesa Hacker that he was given probably did a good job. Considering there was not much being seen on MRI I do not think there is a high likelihood of severe osteomyelitis at this point we will definitely keep an eye on things however. Nonetheless right now has been utilizing Silvadene cream I definitely think we can find something better to help  get this moving in the right direction. Patient does have a history of diabetes mellitus type 2, chronic kidney disease stage III, hypertension, and again he has had an amputation of the left leg currently. The right leg is the 1 that we are actually treating the great toe wound on. He has a below-knee amputation. 11-11-2021 upon evaluation today patient's wound actually is showing signs of excellent improvement. I am actually very pleased even compared to last week this is already better. He is very happy as well. 12-02-2021 upon evaluation today patient appears to be doing pretty well in regard to his toe ulcer this been several weeks since I saw him part of it was our scheduling with me being out of town and part of it was he actually missed an appointment secondary to his trunk the will actually fell off and he had some issues there getting it fixed on Monday so he missed his appointment last Monday. Nonetheless the good news is in today things appear to be doing well no signs of infection though he does have a pretty good need for sharp debridement to try to clear some of this away. 11/6; right first toes ulcer complicated by his left-sided BKA.Marland KitchenNo major change from last visit 12-16-2021 upon evaluation today patient appears to be doing well currently in regard to his toe ulcer that was not making a good amount of improvement yet I do believe the total contact cast is really good to be necessary. I talked about that before he tells me his wife can drive him around he is done with working now also he can actually look towards doing this. 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 12-30-2021 upon evaluation today patient appears to be doing well currently in regard to his foot ulcer on the toe although he is still building up quite a  bit of callus. Will actually get the total contact  cast on him today and started which I think is good to be very beneficial. Sarinana, Sedrick L ([id number]) [id number] Page 6 of 8 01-02-2022 upon evaluation today patient appears to be doing much better in regard to his wound. The cast does seem to do excellent for him. Fortunately there does not appear to be any evidence of active infections at this time. 01-09-2022 upon evaluation today patient appears to be doing excellent in regard to his wound. He is actually showing signs of excellent improvement and in general I think were very close to complete resolution. In fact there might be just a really tiny area, 0.1, still open but again this is much better than where we were prior to put the cast on. Its only been a little over a week since we put the first cast in place. Objective Constitutional Well-nourished and well-hydrated in no acute distress. Vitals Time Taken: 9:55 AM, Height: 77 in, Weight: 262 lbs, BMI: 31.1, Temperature: 97.8 F, Pulse: 54 bpm, Respiratory Rate: 18 breaths/min, Blood Pressure: 160/87 mmHg. Respiratory normal breathing without difficulty. Psychiatric this patient is able to make decisions and demonstrates good insight into disease process. Alert and Oriented x 3. pleasant and cooperative. General Notes: Upon inspection patient's wound did require debridement just clearway some of the callus once removed there was just a tiny pinpoint opening remaining otherwise this appears to be pretty much healed and I think by next week he will be ready for discharge and to be out of the casting. Integumentary (Hair, Skin) Wound #1 status is Open. Original cause of wound was Gradually Appeared. The date acquired was: 02/15/2020. The wound has been in treatment 9 weeks. The wound is located on the [employer]. The wound measures 0.1cm length x 0.1cm width x 0.1cm depth; 0.008cm^2 area and 0.001cm^3 volume. oe There is Fat Layer (Subcutaneous  Tissue) exposed. There is no tunneling or undermining noted. There is a none present amount of drainage noted. There is no granulation within the wound bed. There is no necrotic tissue within the wound bed. Assessment Active Problems ICD-10 Type 2 diabetes mellitus with foot ulcer Non-pressure chronic ulcer of other part of right foot with fat layer exposed Chronic kidney disease, stage 3 unspecified Essential (primary) hypertension Procedures Wound #1 Pre-procedure diagnosis of Wound #1 is a Diabetic Wound/Ulcer of the Lower Extremity located on the Right,Plantar T Great .Severity of Tissue Pre oe Debridement is: Fat layer exposed. There was a Selective/Open Wound Skin/Epidermis Debridement with a total area of 0.04 sq cm performed by Tommie Sams., PA-C. With the following instrument(s): Curette to remove Non-Viable tissue/material. Material removed includes Eschar, Skin: Dermis, and Skin: Epidermis. A time out was conducted at 10:10, prior to the start of the procedure. A Moderate amount of bleeding was controlled with Silver Nitrate. The procedure was tolerated well. Post Debridement Measurements: 0.1cm length x 0.1cm width x 0.1cm depth; 0.001cm^3 volume. Character of Wound/Ulcer Post Debridement is stable. Severity of Tissue Post Debridement is: Fat layer exposed. Post procedure Diagnosis Wound #1: Same as Pre-Procedure Pre-procedure diagnosis of Wound #1 is a Diabetic Wound/Ulcer of the Lower Extremity located on the Right,Plantar T Great . There was a T Contact oe otal Cast Procedure by Tommie Sams., PA-C. Post procedure Diagnosis Wound #1: Same as Pre-Procedure Plan 1. I did go ahead and reapply the total contact cast today I think that this is still going to be the right  thing to do at this point. 2. I am also can recommend that he continue to not drive obviously I think that that would be dangerous for him. He voiced understanding. We will see patient back for reevaluation in  1 week here in the clinic. If anything worsens or changes patient will contact our office for additional recommendations. I feel that he is likely to be healed by next week. ZAXTON, ANGERER ([id number]) [id number] Page 7 of 8 Electronic Signature(s) Signed: 01/09/2022 10:27:47 AM By: Worthy Keeler PA-C Entered By: Worthy Keeler on 01/09/2022 10:27:47 -------------------------------------------------------------------------------- Total Contact Cast Details Patient Name: Date of Service: IMANOL, BIHL 01/09/2022 9:45 A M Medical Record Number: [id number] Patient Account Number: [id number] Date of Birth/Sex: Treating RN: [birth year] (61 y.o. Verl Blalock Primary Care Provider: Bernardo Heater Other Clinician: Massie Kluver Referring Provider: Treating Provider/Extender: [name]: 9 T Contact Cast Applied for Wound Assessment: otal Wound #1 Right,Plantar T Great oe Performed By: Physician Tommie Sams., PA-C Post Procedure Diagnosis Same as Pre-procedure Electronic Signature(s) Signed: 01/09/2022 4:35:46 PM By: Worthy Keeler PA-C Signed: 01/10/2022 10:21:17 AM By: Massie Kluver Entered By: Massie Kluver on 01/09/2022 10:13:53 -------------------------------------------------------------------------------- SuperBill Details Patient Name: Date of Service: [name] 01/09/2022 Medical Record Number: [id number] Patient Account Number: [id number] Date of Birth/Sex: Treating RN: [birth year] (60 y.o. Verl Blalock Primary Care Provider: Bernardo Heater Other Clinician: Massie Kluver Referring Provider: Treating Provider/Extender: [name]: 9 Diagnosis Coding ICD-10 Codes Code Description E11.621 Type 2 diabetes mellitus with foot ulcer L97.512 Non-pressure chronic ulcer of other part of right foot with fat layer exposed N18.30 Chronic kidney disease, stage 3  unspecified I10 Essential (primary) hypertension Facility Procedures : [facility] CPT4 CodeJhace, Fennell ([id number] Description: 3) [id number] - DEBRIDE WOUND 1ST 20 SQ CM OR < ICD-10 Diagnosis Description L97.512 Non-pressure chronic ulcer of other part of right foot with fat layer expos Modifier: [id number] Quantity: [id number] Page 8 of 8 1 Physician Procedures : CPT4 Code Description Modifier [id number] - WC PHYS DEBR WO ANESTH 20 SQ CM ICD-10 Diagnosis Description L97.512 Non-pressure chronic ulcer of other part of right foot with fat layer exposed Quantity: 1 Electronic Signature(s) Signed: 01/09/2022 10:27:53 AM By: Worthy Keeler PA-C Entered By: Worthy Keeler on 01/09/2022 10:27:53

## 2022-01-09 NOTE — Progress Notes (Addendum)
MUHAMED, LUECKE ([id number]) [id number] Page 1 of 7 Visit Report for 01/09/2022 Arrival Information Details Patient Name: Date of Service: [name], [name] 01/09/2022 9:45 A M Medical Record Number: [id number] Patient Account Number: [id number] Date of Birth/Sex: Treating RN: 02/20/1960 (61 y.o. [name], [name] Primary Care Provider: Bernardo Heater Other Clinician: Massie Kluver Referring Provider: Treating Provider/Extender: [name]: 9 Visit Information History Since Last Visit All ordered tests and consults were completed: No Patient Arrived: Ambulatory Added or deleted any medications: No Arrival Time: 09:50 Any new allergies or adverse reactions: No Transfer Assistance: None Had a fall or experienced change in No Patient Identification Verified: Yes activities of daily living that may affect Secondary Verification Process Completed: Yes risk of falls: Patient Requires Transmission-Based Precautions: No Signs or symptoms of abuse/neglect since last visito No Patient Has Alerts: Yes Hospitalized since last visit: No Patient Alerts: 09/05/21 right 1.26 Implantable device outside of the clinic excluding No 09/05/21 left 1.10 cellular tissue based products placed in the center since last visit: Has Dressing in Place as Prescribed: Yes Has Footwear/Offloading in Place as Prescribed: Yes Left: T Contact Cast otal Pain Present Now: No Electronic Signature(s) Signed: 01/10/2022 10:21:17 AM By: Massie Kluver Entered By: Massie Kluver on 01/09/2022 09:54:39 -------------------------------------------------------------------------------- Encounter Discharge Information Details Patient Name: Date of Service: [name] 01/09/2022 9:45 A M Medical Record Number: [id number] Patient Account Number: [id number] Date of Birth/Sex: Treating RN: [birth year] (61 y.o. Verl Blalock Primary Care Provider: Bernardo Heater Other Clinician: Massie Kluver Referring Provider: Treating Provider/Extender: [name]: 9 Encounter Discharge Information Items Post Procedure Vitals Discharge Condition: Stable Temperature (F): 97.7 Ambulatory Status: Ambulatory Pulse (bpm): 59 Discharge Destination: Home Respiratory Rate (breaths/min): 16 Transportation: Private Auto Blood Pressure (mmHg): 160/87 Accompanied By: self [name] ([id number]) [id number] Page 2 of 7 Schedule Follow-up Appointment: Yes Clinical Summary of Care: Electronic Signature(s) Signed: 01/10/2022 10:21:17 AM By: Massie Kluver Entered By: Massie Kluver on 01/09/2022 11:41:48 -------------------------------------------------------------------------------- Lower Extremity Assessment Details Patient Name: Date of Service: KENDON, SEDENO 01/09/2022 9:45 A M Medical Record Number: [id number] Patient Account Number: [id number] Date of Birth/Sex: Treating RN: 1960/02/12 (61 y.o. Verl Blalock Primary Care Provider: Bernardo Heater Other Clinician: Massie Kluver Referring Provider: Treating Provider/Extender: [name]: 9 Vascular Assessment Pulses: Dorsalis Pedis Palpable: [Left:Yes] Electronic Signature(s) Signed: 01/10/2022 10:21:17 AM By: Massie Kluver Signed: 01/10/2022 11:32:10 AM By: Gretta Cool, BSN, RN, CWS, Kim RN, BSN Entered By: Massie Kluver on 01/09/2022 10:28:19 -------------------------------------------------------------------------------- Multi Wound Chart Details Patient Name: Date of Service: [name] 01/09/2022 9:45 A M Medical Record Number: [id number] Patient Account Number: [id number] Date of Birth/Sex: Treating RN: Sep 02, 1960 (61 y.o. Verl Blalock Primary Care Provider: Bernardo Heater Other Clinician: Massie Kluver Referring Provider: Treating Provider/Extender: [name]: 9 Vital Signs Height(in): 77 Pulse(bpm): 54 Weight(lbs): 262 Blood Pressure(mmHg): 160/87 Body Mass Index(BMI): 31.1 Temperature(F): 97.8 Respiratory Rate(breaths/min): 18 Matthews, Mckinnon L ([id number]) Electronic Signature(s) Signed: 01/10/2022 10:21:17 AM By: Massie Kluver Entered By: Massie Kluver on 01/09/2022 10:10:22 -------------------------------------------------------------------------------- Multi-Disciplinary Care Plan Details Patient Name: Date of Service: [name] 01/09/2022 9:45 A M Medical Record Number: [id number] Patient Account Number: [id number] Date of Birth/Sex: Treating RN: 09/27/1960 (61 y.o. Verl Blalock Primary Care Provider: Bernardo Heater Other Clinician: Massie Kluver Referring Provider: Treating Provider/Extender: [name]: 9 Active  Inactive Necrotic Tissue Nursing Diagnoses: Impaired tissue integrity related to necrotic/devitalized tissue Knowledge deficit related to management of necrotic/devitalized tissue Goals: Necrotic/devitalized tissue will be minimized in the wound bed Date Initiated: 11/11/2021 Target Resolution Date: 11/11/2021 Goal Status: Active Patient/caregiver will verbalize understanding of reason and process for debridement of necrotic tissue Date Initiated: 11/11/2021 Date Inactivated: 12/02/2021 Target Resolution Date: 11/11/2021 Goal Status: Met Interventions: Assess patient pain level pre-, during and post procedure and prior to discharge Provide education on necrotic tissue and debridement process Treatment Activities: Excisional debridement : 11/11/2021 Notes: Nutrition Nursing Diagnoses: Potential for alteratiion in Nutrition/Potential for imbalanced nutrition Goals: Patient/caregiver verbalizes understanding of need to maintain therapeutic glucose control per primary care physician Date Initiated: 11/07/2021 Target Resolution Date:  12/08/2021 Goal Status: Active Interventions: Assess HgA1c results as ordered upon admission and as needed Assess patient nutrition upon admission and as needed per policy Provide education on elevated blood sugars and impact on wound healing Provide education on nutrition Treatment Activities: Education provided on Nutrition : 11/07/2021 Notes: AUGIE, VANE ([id number]) [id number] Page 4 of 7 Wound/Skin Impairment Nursing Diagnoses: Knowledge deficit related to ulceration/compromised skin integrity Goals: Patient/caregiver will verbalize understanding of skin care regimen Date Initiated: 11/07/2021 Target Resolution Date: 12/08/2021 Goal Status: Active Ulcer/skin breakdown will have a volume reduction of 30% by week 4 Date Initiated: 11/07/2021 Target Resolution Date: 01/07/2022 Goal Status: Active Ulcer/skin breakdown will have a volume reduction of 50% by week 8 Date Initiated: 11/07/2021 Target Resolution Date: 02/07/2022 Goal Status: Active Ulcer/skin breakdown will have a volume reduction of 80% by week 12 Date Initiated: 11/07/2021 Target Resolution Date: 03/10/2022 Goal Status: Active Ulcer/skin breakdown will heal within 14 weeks Date Initiated: 11/07/2021 Target Resolution Date: 04/15/2022 Goal Status: Active Interventions: Assess patient/caregiver ability to obtain necessary supplies Assess patient/caregiver ability to perform ulcer/skin care regimen upon admission and as needed Assess ulceration(s) every visit Notes: Electronic Signature(s) Signed: 01/10/2022 10:21:17 AM By: Massie Kluver Signed: 01/10/2022 11:32:10 AM By: Gretta Cool, BSN, RN, CWS, Kim RN, BSN Entered By: Massie Kluver on 01/09/2022 10:29:06 -------------------------------------------------------------------------------- Pain Assessment Details Patient Name: Date of Service: [name] 01/09/2022 9:45 A M Medical Record Number: [id number] Patient Account Number:  [id number] Date of Birth/Sex: Treating RN: Dec 16, 1960 (61 y.o. Verl Blalock Primary Care Mailynn Everly: Bernardo Heater Other Clinician: Massie Kluver Referring Gurneet Matarese: Treating Clayborn Milnes/Extender: [name]: 9 Active Problems Location of Pain Severity and Description of Pain Patient Has Paino No Site Locations [place], [place] ([id number]) [phone].pdf Page 5 of 7 Pain Management and Medication Current Pain Management: Electronic Signature(s) Signed: 01/10/2022 10:21:17 AM By: Massie Kluver Signed: 01/10/2022 11:32:10 AM By: Gretta Cool, BSN, RN, CWS, Kim RN, BSN Entered By: Massie Kluver on 01/09/2022 09:57:22 -------------------------------------------------------------------------------- Patient/Caregiver Education Details Patient Name: Date of Service: [name] 12/7/2023andnbsp9:45 A M Medical Record Number: [id number] Patient Account Number: [id number] Date of Birth/Gender: Treating RN: 1960/09/21 (61 y.o. Verl Blalock Primary Care Physician: Bernardo Heater Other Clinician: Massie Kluver Referring Physician: Treating Physician/Extender: Massie Kluver in Treatment: 9 Education Assessment Education Provided To: Patient Education Topics Provided Wound/Skin Impairment: Handouts: Other: continue wound care as directed Methods: Explain/Verbal Responses: State content correctly Electronic Signature(s) Signed: 01/10/2022 10:21:17 AM By: Massie Kluver Entered By: Massie Kluver on 01/09/2022 10:28:52 Crisman, Oriel L ([id number]) [id number] Page 6 of 7 -------------------------------------------------------------------------------- Wound Assessment Details Patient Name: Date of Service: ADVAY, VOLANTE 01/09/2022 9:45 A M Medical Record Number: [id number] Patient Account Number:  [id number] Date of Birth/Sex: Treating RN: 02/27/1960 (61 y.o. [name],  [name] Primary Care Ahmir Bracken: Bernardo Heater Other Clinician: Massie Kluver Referring Rayshaun Needle: Treating Loi Rennaker/Extender: [name]: 9 Wound Status Wound Number: 1 Primary Diabetic Wound/Ulcer of the Lower Extremity Etiology: Wound Location: Right, Plantar T Great oe Wound Open Wounding Event: Gradually Appeared Status: Date Acquired: 02/15/2020 Comorbid Coronary Artery Disease, Hypertension, Peripheral Venous Weeks Of Treatment: 9 History: Disease, Type II Diabetes Clustered Wound: No Wound Measurements Length: (cm) 0.1 Width: (cm) 0.1 Depth: (cm) 0.1 Area: (cm) 0.008 Volume: (cm) 0.001 % Reduction in Area: 97.7% % Reduction in Volume: 99% Epithelialization: Large (67-100%) Tunneling: No Undermining: No Wound Description Classification: Grade 2 Exudate Amount: None Present Foul Odor After Cleansing: No Slough/Fibrino No Wound Bed Granulation Amount: None Present (0%) Exposed Structure Necrotic Amount: None Present (0%) Fascia Exposed: No Fat Layer (Subcutaneous Tissue) Exposed: Yes Tendon Exposed: No Muscle Exposed: No Joint Exposed: No Bone Exposed: No Treatment Notes Wound #1 (Toe Great) Wound Laterality: Plantar, Right Cleanser Wound Cleanser Discharge Instruction: Wash your hands with soap and water. Remove old dressing, discard into plastic bag and place into trash. Cleanse the wound with Wound Cleanser prior to applying a clean dressing using gauze sponges, not tissues or cotton balls. Do not scrub or use excessive force. Pat dry using gauze sponges, not tissue or cotton balls. Peri-Wound Care Topical Primary Dressing Prisma 4.34 (in) Discharge Instruction: Moisten w/normal saline or sterile water; Cover wound as directed. Do not remove from wound bed. Secondary Dressing Gauze Secured With Medipore T - 67M Medipore H Soft Cloth Surgical T ape ape, 2x2 (in/yd) Compression Wrap Compression Stockings Ramos,  Declin L ([id number]) [id number] Page 7 of 7 Add-Ons Electronic Signature(s) Signed: 01/10/2022 10:21:17 AM By: Massie Kluver Signed: 01/10/2022 11:32:10 AM By: Gretta Cool, BSN, RN, CWS, Kim RN, BSN Entered By: Massie Kluver on 01/09/2022 10:12:10 -------------------------------------------------------------------------------- Vitals Details Patient Name: Date of Service: [name] 01/09/2022 9:45 A M Medical Record Number: [id number] Patient Account Number: [id number] Date of Birth/Sex: Treating RN: Feb 28, 1960 (61 y.o. [name], [name] Primary Care Naseer Hearn: Bernardo Heater Other Clinician: Massie Kluver Referring Kimra Kantor: Treating Ahniyah Giancola/Extender: [name]: 9 Vital Signs Time Taken: 09:55 Temperature (F): 97.8 Height (in): 77 Pulse (bpm): 54 Weight (lbs): 262 Respiratory Rate (breaths/min): 18 Body Mass Index (BMI): 31.1 Blood Pressure (mmHg): 160/87 Reference Range: 80 - 120 mg / dl Electronic Signature(s) Signed: 01/10/2022 10:21:17 AM By: Massie Kluver Entered By: Massie Kluver on 01/09/2022 09:57:17

## 2022-01-17 ENCOUNTER — Encounter: Payer: HMO | Admitting: Physician Assistant

## 2022-01-17 DIAGNOSIS — E11621 Type 2 diabetes mellitus with foot ulcer: Secondary | ICD-10-CM | POA: Diagnosis not present

## 2022-01-17 NOTE — Progress Notes (Signed)
AARAV, BURGETT ([id number]) [id number] Page 1 of 6 Visit Report for 01/17/2022 Chief Complaint Document Details Patient Name: Date of Service: [name], [name] 01/17/2022 9:30 A M Medical Record Number: [id number] Patient Account Number: [id number] Date of Birth/Sex: Treating RN: 1960/08/23 (61 y.o. Jerilynn Mages) Carlene Coria Primary Care Provider: Bernardo Heater Other Clinician: Massie Kluver Referring Provider: Treating Provider/Extender: [name]: 10 Information Obtained from: Patient Chief Complaint Right great toe ulcer Electronic Signature(s) Signed: 01/17/2022 9:43:02 AM By: Worthy Keeler PA-C Entered By: Worthy Keeler on 01/17/2022 09:43:02 -------------------------------------------------------------------------------- HPI Details Patient Name: Date of Service: [name] 01/17/2022 9:30 A M Medical Record Number: [id number] Patient Account Number: [id number] Date of Birth/Sex: Treating RN: 08/06/1960 (61 y.o. Oval Linsey Primary Care Provider: Bernardo Heater Other Clinician: Massie Kluver Referring Provider: Treating Provider/Extender: [name]: 10 History of Present Illness HPI Description: 11-07-2021 upon evaluation today patient appears to be doing somewhat poorly in regard to a wound on his right plantar great toe. Fortunately there does not appear to be any evidence of active infection locally or systemically at this time which is great news. No fevers, chills, nausea, vomiting, or diarrhea. With that being said he tells me that he really has not been noticing a lot of improvement either with regard to the treatment. He has been under the care of Dr. Earleen Newport up to this point for roughly a year. More recently he did have an MRI which revealed the potential for possible osteomyelitis. With that being said I do believe that he definitely should probably  be monitored for this but to be honest the Elesa Hacker that he was given probably did a good job. Considering there was not much being seen on MRI I do not think there is a high likelihood of severe osteomyelitis at this point we will definitely keep an eye on things however. Nonetheless right now has been utilizing Silvadene cream I definitely think we can find something better to help get this moving in the right direction. Patient does have a history of diabetes mellitus type 2, chronic kidney disease stage III, hypertension, and again he has had an amputation of the left leg currently. The right leg is the 1 that we are actually treating the great toe wound on. He has a below-knee amputation. 11-11-2021 upon evaluation today patient's wound actually is showing signs of excellent improvement. I am actually very pleased even compared to last week this is already better. He is very happy as well. 12-02-2021 upon evaluation today patient appears to be doing pretty well in regard to his toe ulcer this been several weeks since I saw him part of it was our scheduling with me being out of town and part of it was he actually missed an appointment secondary to his trunk the will actually fell off and he had some Heuberger, Roen L ([id number]) [phone].pdf Page 2 of 6 issues there getting it fixed on Monday so he missed his appointment last Monday. Nonetheless the good news is in today things appear to be doing well no signs of infection though he does have a pretty good need for sharp debridement to try to clear some of this away. 11/6; right first toes ulcer complicated by his left-sided BKA.Marland KitchenNo major change from last visit 12-16-2021 upon evaluation today patient appears to be doing well currently in regard to his toe ulcer that was not making a good amount of improvement yet  I do believe the total contact cast is really good to be necessary. I talked about that before he tells me his  wife can drive him around he is done with working now also he can actually look towards doing this. 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 12-30-2021 upon evaluation today patient appears to be doing well currently in regard to his foot ulcer on the toe although he is still building up quite a bit of callus. Will actually get the total contact cast on him today and started which I think is good to be very beneficial. 01-02-2022 upon evaluation today patient appears to be doing much better in regard to his wound. The cast does seem to do excellent for him. Fortunately there does not appear to be any evidence of active infections at this time. 01-09-2022 upon evaluation today patient appears to be doing excellent in regard to his wound. He is actually showing signs of excellent improvement and in general I think were very close to complete resolution. In fact there might be just a really tiny area, 0.1, still open but again this is much better than where we were prior to put the cast on. Its only been a little over a week since we put the first cast in place. 01-17-2022 upon 11 valuation today patient appears to be doing well currently in regard to his toe ulcer. In fact he appears to be completely healed based on what I am seeing at this point. I do not see any evidence of anything open. Electronic Signature(s) Signed: 01/17/2022 10:02:31 AM By: Worthy Keeler PA-C Entered By: Worthy Keeler on 01/17/2022 10:02:31 -------------------------------------------------------------------------------- Physical Exam Details Patient Name: Date of Service: RASHAWN, RAYMAN 01/17/2022 9:30 A M Medical Record Number: [id number] Patient Account Number: [id number] Date of Birth/Sex: Treating RN: 1960/05/29 (61 y.o. Oval Linsey Primary Care Provider: Bernardo Heater Other Clinician:  Massie Kluver Referring Provider: Treating Provider/Extender: [name]: 49 Constitutional Well-nourished and well-hydrated in no acute distress. Respiratory normal breathing without difficulty. Psychiatric this patient is able to make decisions and demonstrates good insight into disease process. Alert and Oriented x 3. pleasant and cooperative. Notes Upon evaluation patient's wound bed actually showed signs of good granulation epithelization at this point. Fortunately there does not appear to be any signs of active infection at this time which is great news and overall I am extremely pleased with where we stand today. No fevers, chills, nausea, vomiting, or diarrhea. Electronic Signature(s) Signed: 01/17/2022 10:05:24 AM By: Worthy Keeler PA-C Entered By: Worthy Keeler on 01/17/2022 10:05:24 Sanker, Reynolds Bowl ([id number]) [id number] Page 3 of 6 -------------------------------------------------------------------------------- Physician Orders Details Patient Name: Date of Service: DEVAUGHN, SAVANT 01/17/2022 9:30 A M Medical Record Number: [id number] Patient Account Number: [id number] Date of Birth/Sex: Treating RN: 23-Jul-1960 (61 y.o. Jerilynn Mages) Carlene Coria Primary Care Provider: Bernardo Heater Other Clinician: Massie Kluver Referring Provider: Treating Provider/Extender: [name]: 10 Verbal / Phone Orders: No Diagnosis Coding ICD-10 Coding Code Description E11.621 Type 2 diabetes mellitus with foot ulcer L97.512 Non-pressure chronic ulcer of other part of right foot with fat layer exposed N18.30 Chronic kidney disease, stage 3 unspecified I10 Essential (primary) hypertension Discharge From [facility] Discharge from [place]! Your wound has healed.  Please call if any further issues arise Follow-up Appointments Other: -  follow up as needed Off-Loading Other: - use double padding around big toe with tape Electronic Signature(s) Unsigned Entered By: Massie Kluver on 01/17/2022 10:09:49 -------------------------------------------------------------------------------- Problem List Details Patient Name: Date of Service: KHAMRON, GELLERT 01/17/2022 9:30 A M Medical Record Number: [id number] Patient Account Number: [id number] Date of Birth/Sex: Treating RN: Aug 30, 1960 (61 y.o. Jerilynn Mages) Carlene Coria Primary Care Provider: Bernardo Heater Other Clinician: Massie Kluver Referring Provider: Treating Provider/Extender: Massie Kluver in Treatment: 10 Active Problems ICD-10 Encounter Code Description Active Date MDM DEKLYN, GIBBON ([id number]) [phone].pdf Page 4 of 6 Code Description Active Date MDM Diagnosis E11.621 Type 2 diabetes mellitus with foot ulcer 11/07/2021 No Yes L97.512 Non-pressure chronic ulcer of other part of right foot with fat layer exposed 11/07/2021 No Yes N18.30 Chronic kidney disease, stage 3 unspecified 11/07/2021 No Yes I10 Essential (primary) hypertension 11/07/2021 No Yes Inactive Problems Resolved Problems Electronic Signature(s) Signed: 01/17/2022 9:42:59 AM By: Worthy Keeler PA-C Entered By: Worthy Keeler on 01/17/2022 09:42:58 -------------------------------------------------------------------------------- Progress Note Details Patient Name: Date of Service: [name] 01/17/2022 9:30 A M Medical Record Number: [id number] Patient Account Number: [id number] Date of Birth/Sex: Treating RN: [birth year] (61 y.o. Oval Linsey Primary Care Provider: Bernardo Heater Other Clinician: Massie Kluver Referring Provider: Treating Provider/Extender: [name]: 10 Subjective Chief Complaint Information obtained from Patient Right great toe ulcer History of Present Illness  (HPI) 11-07-2021 upon evaluation today patient appears to be doing somewhat poorly in regard to a wound on his right plantar great toe. Fortunately there does not appear to be any evidence of active infection locally or systemically at this time which is great news. No fevers, chills, nausea, vomiting, or diarrhea. With that being said he tells me that he really has not been noticing a lot of improvement either with regard to the treatment. He has been under the care of Dr. Earleen Newport up to this point for roughly a year. More recently he did have an MRI which revealed the potential for possible osteomyelitis. With that being said I do believe that he definitely should probably be monitored for this but to be honest the Elesa Hacker that he was given probably did a good job. Considering there was not much being seen on MRI I do not think there is a high likelihood of severe osteomyelitis at this point we will definitely keep an eye on things however. Nonetheless right now has been utilizing Silvadene cream I definitely think we can find something better to help get this moving in the right direction. Patient does have a history of diabetes mellitus type 2, chronic kidney disease stage III, hypertension, and again he has had an amputation of the left leg currently. The right leg is the 1 that we are actually treating the great toe wound on. He has a below-knee amputation. 11-11-2021 upon evaluation today patient's wound actually is showing signs of excellent improvement. I am actually very pleased even compared to last week this is already better. He is very happy as well. 12-02-2021 upon evaluation today patient appears to be doing pretty well in regard to his toe ulcer this been several weeks since I saw him part of it was our scheduling with me being out of town and part of it was he actually missed an appointment secondary to his trunk the will actually fell off and he had some issues there getting  it fixed on  Monday so he missed his appointment last Monday. Nonetheless the good news is in today things appear to be doing well no signs of infection though he does have a pretty good need for sharp debridement to try to clear some of this away. 11/6; right first toes ulcer complicated by his left-sided BKA.Marland KitchenNo major change from last visit 12-16-2021 upon evaluation today patient appears to be doing well currently in regard to his toe ulcer that was not making a good amount of improvement yet I do believe the total contact cast is really good to be necessary. I talked about that before he tells me his wife can drive him around he is done with working Slinger, TELFORD ARCHAMBEAU ([id number]) [id number] Page 5 of 6 now also he can actually look towards doing this. 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 12-30-2021 upon evaluation today patient appears to be doing well currently in regard to his foot ulcer on the toe although he is still building up quite a bit of callus. Will actually get the total contact cast on him today and started which I think is good to be very beneficial. 01-02-2022 upon evaluation today patient appears to be doing much better in regard to his wound. The cast does seem to do excellent for him. Fortunately there does not appear to be any evidence of active infections at this time. 01-09-2022 upon evaluation today patient appears to be doing excellent in regard to his wound. He is actually showing signs of excellent improvement and in general I think were very close to complete resolution. In fact there might be just a really tiny area, 0.1, still open but again this is much better than where we were prior to put the cast on. Its only been a little over a week since we put the first cast in place. 01-17-2022 upon 11 valuation today patient appears to be  doing well currently in regard to his toe ulcer. In fact he appears to be completely healed based on what I am seeing at this point. I do not see any evidence of anything open. Objective Constitutional Well-nourished and well-hydrated in no acute distress. Vitals Time Taken: 9:32 AM, Height: 77 in, Weight: 262 lbs, BMI: 31.1, Temperature: 97.3 F, Pulse: 56 bpm, Respiratory Rate: 18 breaths/min, Blood Pressure: 187/92 mmHg. Respiratory normal breathing without difficulty. Psychiatric this patient is able to make decisions and demonstrates good insight into disease process. Alert and Oriented x 3. pleasant and cooperative. General Notes: Upon evaluation patient's wound bed actually showed signs of good granulation epithelization at this point. Fortunately there does not appear to be any signs of active infection at this time which is great news and overall I am extremely pleased with where we stand today. No fevers, chills, nausea, vomiting, or diarrhea. Integumentary (Hair, Skin) Wound #1 status is Healed - Epithelialized. Original cause of wound was Gradually Appeared. The date acquired was: 02/15/2020. The wound has been in treatment 10 weeks. The wound is located on the [employer]. The wound measures 0cm length x 0cm width x 0cm depth; 0cm^2 area and 0cm^3 oe volume. There is Fat Layer (Subcutaneous Tissue) exposed. There is no tunneling or undermining noted. There is a none present amount of drainage noted. There is no granulation within the wound bed. There is no necrotic tissue within the wound  bed. Assessment Active Problems ICD-10 Type 2 diabetes mellitus with foot ulcer Non-pressure chronic ulcer of other part of right foot with fat layer exposed Chronic kidney disease, stage 3 unspecified Essential (primary) hypertension Plan Discharge From [facility]: Discharge from [place]! Your wound has healed. Please call if any  further issues arise Follow-up Appointments: Other: - follow up as needed Off-Loading: Other: - use double padding around big toe with tape 1. Based on what I am seeing I do believe that the patient needs to continue to protect this area just to allow it to toughen up. We do not want anything to reopen. Again he has had an extra week with the cast on and things are looking excellent but I still think he needs to work his way back into getting around more easily. I do not want to do anything too aggressive and I told this to him and his wife but overall I am extremely happy with where we stand. 2. Based on what I see I think using some foam padding doubled over the big toe will be a good option here for him. Also if he stays off of this for the most part over the next week I think he should be able to ease back into putting his shoe on and then walking around more little by little to build up tolerance. He is in agreement with that plan. Patient will contact me if he has any trouble going forward. Hopefully he will not need me but we are here if so. JOHNANTHONY, WILDEN ([id number]) [id number] Page 6 of 6 Electronic Signature(s) Signed: 01/17/2022 10:06:29 AM By: Worthy Keeler PA-C Entered By: Worthy Keeler on 01/17/2022 10:06:29 -------------------------------------------------------------------------------- SuperBill Details Patient Name: Date of Service: [name] 01/17/2022 Medical Record Number: [id number] Patient Account Number: [id number] Date of Birth/Sex: Treating RN: 1960/05/24 (61 y.o. Jerilynn Mages) Carlene Coria Primary Care Provider: Bernardo Heater Other Clinician: Massie Kluver Referring Provider: Treating Provider/Extender: [name]: 10 Diagnosis Coding ICD-10 Codes Code Description E11.621 Type 2 diabetes mellitus with foot ulcer L97.512 Non-pressure chronic ulcer of other part of right foot with fat  layer exposed N18.30 Chronic kidney disease, stage 3 unspecified I10 Essential (primary) hypertension Facility Procedures : CPT4 Code: [id number] Description: 10272 - WOUND CARE VISIT-LEV 2 EST PT Modifier: Quantity: 1 Physician Procedures : CPT4 Code Description Modifier [id number] - WC PHYS LEVEL 3 - EST PT ICD-10 Diagnosis Description E11.621 Type 2 diabetes mellitus with foot ulcer L97.512 Non-pressure chronic ulcer of other part of right foot with fat layer exposed N18.30 Chronic  kidney disease, stage 3 unspecified I10 Essential (primary) hypertension Quantity: 1 Electronic Signature(s) Unsigned Previous Signature: 01/17/2022 10:07:00 AM Version By: Worthy Keeler PA-C Entered By: Massie Kluver on 01/17/2022 10:10:00 Signature(s): Date(s):

## 2022-01-21 NOTE — Progress Notes (Signed)
[name] ([id number]) [id number] Page 1 of 8 Visit Report for 01/17/2022 Arrival Information Details Patient Name: Date of Service: [name], [name] 01/17/2022 9:30 A M Medical Record Number: [id number] Patient Account Number: [id number] Date of Birth/Sex: Treating RN: 1960/04/08 (61 y.o. Jerilynn Mages) Carlene Coria Primary Care Meshulem Onorato: Bernardo Heater Other Clinician: Massie Kluver Referring Tacoya Altizer: Treating Katana Berthold/Extender: Massie Kluver in Treatment: 10 Visit Information History Since Last Visit All ordered tests and consults were completed: No Patient Arrived: Ambulatory Added or deleted any medications: No Arrival Time: 09:30 Any new allergies or adverse reactions: No Transfer Assistance: None Had a fall or experienced change in No Patient Identification Verified: Yes activities of daily living that may affect Secondary Verification Process Completed: Yes risk of falls: Patient Requires Transmission-Based Precautions: No Signs or symptoms of abuse/neglect since last visito No Patient Has Alerts: Yes Hospitalized since last visit: No Patient Alerts: 09/05/21 right 1.26 Implantable device outside of the clinic excluding No 09/05/21 left 1.10 cellular tissue based products placed in the center since last visit: Has Dressing in Place as Prescribed: Yes Has Footwear/Offloading in Place as Prescribed: Yes Left: T Contact Cast otal Pain Present Now: No Electronic Signature(s) Signed: 01/21/2022 4:44:06 PM By: Massie Kluver Entered By: Massie Kluver on 01/17/2022 09:31:33 -------------------------------------------------------------------------------- Clinic Level of Care Assessment Details Patient Name: Date of Service: [name] 01/17/2022 9:30 A M Medical Record Number: [id number] Patient Account Number: [id number] Date of Birth/Sex: Treating RN: Feb 24, 1960 (61 y.o. Jerilynn Mages) Carlene Coria Primary Care Sherisa Gilvin:  Bernardo Heater Other Clinician: Massie Kluver Referring Cailie Bosshart: Treating Chakara Bognar/Extender: [name]: 10 Clinic Level of Care Assessment Items TOOL 4 Quantity Score []  - 0 Use when only an EandM is performed on FOLLOW-UP visit ASSESSMENTS - Nursing Assessment / Reassessment X- 1 10 Reassessment of Co-morbidities (includes updates in patient status) [name], [name] ([id number]) [id number] Page 2 of 8 X- 1 5 Reassessment of Adherence to Treatment Plan ASSESSMENTS - Wound and Skin A ssessment / Reassessment X - Simple Wound Assessment / Reassessment - one wound 1 5 []  - 0 Complex Wound Assessment / Reassessment - multiple wounds []  - 0 Dermatologic / Skin Assessment (not related to wound area) ASSESSMENTS - Focused Assessment []  - 0 Circumferential Edema Measurements - multi extremities []  - 0 Nutritional Assessment / Counseling / Intervention []  - 0 Lower Extremity Assessment (monofilament, tuning fork, pulses) []  - 0 Peripheral Arterial Disease Assessment (using hand held doppler) ASSESSMENTS - Ostomy and/or Continence Assessment and Care []  - 0 Incontinence Assessment and Management []  - 0 Ostomy Care Assessment and Management (repouching, etc.) PROCESS - Coordination of Care X - Simple Patient / Family Education for ongoing care 1 15 []  - 0 Complex (extensive) Patient / Family Education for ongoing care []  - 0 Staff obtains Programmer, systems, Records, T Results / Process Orders est []  - 0 Staff telephones HHA, Nursing Homes / Clarify orders / etc []  - 0 Routine Transfer to another Facility (non-emergent condition) []  - 0 Routine Hospital Admission (non-emergent condition) []  - 0 New Admissions / Biomedical engineer / Ordering NPWT Apligraf, etc. , []  - 0 Emergency Hospital Admission (emergent condition) X- 1 10 Simple Discharge Coordination []  - 0 Complex (extensive) Discharge  Coordination PROCESS - Special Needs []  - 0 Pediatric / Minor Patient Management []  - 0 Isolation Patient Management []  - 0 Hearing / Language / Visual special needs []  - 0 Assessment of Community assistance (transportation, D/C planning, etc.) []  -  0 Additional assistance / Altered mentation []  - 0 Support Surface(s) Assessment (bed, cushion, seat, etc.) INTERVENTIONS - Wound Cleansing / Measurement X - Simple Wound Cleansing - one wound 1 5 []  - 0 Complex Wound Cleansing - multiple wounds X- 1 5 Wound Imaging (photographs - any number of wounds) []  - 0 Wound Tracing (instead of photographs) []  - 0 Simple Wound Measurement - one wound []  - 0 Complex Wound Measurement - multiple wounds INTERVENTIONS - Wound Dressings X - Small Wound Dressing one or multiple wounds 1 10 []  - 0 Medium Wound Dressing one or multiple wounds []  - 0 Large Wound Dressing one or multiple wounds []  - 0 Application of Medications - topical []  - 0 Application of Medications - injection [name], [name] ([id number]) [id number] Page 3 of 8 INTERVENTIONS - Miscellaneous []  - 0 External ear exam []  - 0 Specimen Collection (cultures, biopsies, blood, body fluids, etc.) []  - 0 Specimen(s) / Culture(s) sent or taken to Lab for analysis []  - 0 Patient Transfer (multiple staff / Harrel Lemon Lift / Similar devices) []  - 0 Simple Staple / Suture removal (25 or less) []  - 0 Complex Staple / Suture removal (26 or more) []  - 0 Hypo / Hyperglycemic Management (close monitor of Blood Glucose) []  - 0 Ankle / Brachial Index (ABI) - do not check if billed separately X- 1 5 Vital Signs Has the patient been seen at the hospital within the last three years: Yes Total Score: 70 Level Of Care: New/Established - Level 2 Electronic Signature(s) Signed: 01/21/2022 4:44:06 PM By: Massie Kluver Entered By: Massie Kluver on 01/17/2022  10:09:55 -------------------------------------------------------------------------------- Encounter Discharge Information Details Patient Name: Date of Service: [name]. 01/17/2022 9:30 A M Medical Record Number: [id number] Patient Account Number: [id number] Date of Birth/Sex: Treating RN: Oct 19, 1960 (61 y.o. Oval Linsey Primary Care Tomie Elko: Bernardo Heater Other Clinician: Massie Kluver Referring Devario Bucklew: Treating Jonnatan Hanners/Extender: Massie Kluver in Treatment: 10 Encounter Discharge Information Items Discharge Condition: Stable Ambulatory Status: Ambulatory Discharge Destination: Home Transportation: Private Auto Accompanied By: self Schedule Follow-up Appointment: No Clinical Summary of Care: Electronic Signature(s) Signed: 01/21/2022 4:44:06 PM By: Massie Kluver Entered By: Massie Kluver on 01/17/2022 10:00:18 [name], [name] ([id number]) [id number] Page 4 of 8 -------------------------------------------------------------------------------- Lower Extremity Assessment Details Patient Name: Date of Service: ZEPHAN, BEAUCHAINE 01/17/2022 9:30 A M Medical Record Number: [id number] Patient Account Number: [id number] Date of Birth/Sex: Treating RN: [birth year] (61 y.o. Jerilynn Mages) Carlene Coria Primary Care Eilene Voigt: Bernardo Heater Other Clinician: Massie Kluver Referring Viveca Beckstrom: Treating Hue Steveson/Extender: [name]: 10 Electronic Signature(s) Signed: 01/18/2022 3:52:05 PM By: Carlene Coria RN Signed: 01/21/2022 4:44:06 PM By: Massie Kluver Entered By: Massie Kluver on 01/17/2022 09:44:18 -------------------------------------------------------------------------------- Multi Wound Chart Details Patient Name: Date of Service: [name]. 01/17/2022 9:30 A M Medical Record Number: [id number] Patient Account Number: [id number] Date of Birth/Sex: Treating RN: 17-Aug-1960 (61  y.o. Oval Linsey Primary Care Mancil Pfenning: Bernardo Heater Other Clinician: Massie Kluver Referring Chao Blazejewski: Treating Amabel Stmarie/Extender: [name]: 10 Vital Signs Height(in): 35 Pulse(bpm): 40 Weight(lbs): 59 Blood Pressure(mmHg): 187/92 Body Mass Index(BMI): 31.1 Temperature(F): 97.3 Respiratory Rate(breaths/min): 18 [1:Photos:] [N/A:N/A] Right, Plantar T Great oe N/A N/A Wound Location: Gradually Appeared N/A N/A Wounding Event: Diabetic Wound/Ulcer of the Lower N/A N/A Primary Etiology: Extremity Coronary Artery Disease, N/A N/A Comorbid History: Hypertension, Peripheral Venous Disease, Type II Diabetes 02/15/2020 N/A N/A Date Acquired: 10 N/A N/A Weeks  of Treatment: Healed - Epithelialized N/A N/A Wound Status: No N/A N/A Wound Recurrence: 0x0x0 N/A N/A Measurements [name] x W x D (cm) 0 N/A N/A A (cm) : rea 0 N/A N/A Volume (cm) : 100.00% N/A N/A % Reduction in A rea: 100.00% N/A N/A % Reduction in Volume: Grade 2 N/A N/A Classification: None Present N/A N/A Exudate A mount: None Present (0%) N/A N/A Granulation A mount: None Present (0%) N/A N/A Necrotic A mount: Fat Layer (Subcutaneous Tissue): Yes N/A N/A Exposed Structures: [name], [name] ([id number]) [id number] Page 5 of 8 Fascia: No Tendon: No Muscle: No Joint: No Bone: No Large (67-100%) N/A N/A Epithelialization: Treatment Notes Wound #1 (Toe Great) Wound Laterality: Plantar, Right Cleanser Peri-Wound Care Topical Primary Dressing Secondary Dressing Secured With Compression Wrap Compression Stockings Add-Ons Electronic Signature(s) Signed: 01/21/2022 4:44:06 PM By: Massie Kluver Entered By: Massie Kluver on 01/17/2022 10:09:31 -------------------------------------------------------------------------------- [place] Details Patient Name: Date of Service: [name]. 01/17/2022  9:30 A M Medical Record Number: [id number] Patient Account Number: [id number] Date of Birth/Sex: Treating RN: [birth year] (61 y.o. Jerilynn Mages) Carlene Coria Primary Care Kass Herberger: Bernardo Heater Other Clinician: Massie Kluver Referring Deionte Spivack: Treating Fidelia Cathers/Extender: Massie Kluver in Treatment: 10 Active Inactive Electronic Signature(s) Signed: 01/18/2022 3:52:05 PM By: Carlene Coria RN Signed: 01/21/2022 4:44:06 PM By: Massie Kluver Entered By: Massie Kluver on 01/17/2022 10:10:24 [name], [name] ([id number]) [id number] Page 6 of 8 -------------------------------------------------------------------------------- Pain Assessment Details Patient Name: Date of Service: [name], [name] 01/17/2022 9:30 A M Medical Record Number: [id number] Patient Account Number: [id number] Date of Birth/Sex: Treating RN: 1960/12/10 (61 y.o. Jerilynn Mages) Carlene Coria Primary Care Ayaka Andes: Bernardo Heater Other Clinician: Massie Kluver Referring Hadassah Rana: Treating Nezar Buckles/Extender: [name]: 10 Active Problems Location of Pain Severity and Description of Pain Patient Has Paino No Site Locations Pain Management and Medication Current Pain Management: Electronic Signature(s) Signed: 01/18/2022 3:52:05 PM By: Carlene Coria RN Signed: 01/21/2022 4:44:06 PM By: Massie Kluver Entered By: Massie Kluver on 01/17/2022 09:34:35 -------------------------------------------------------------------------------- Patient/Caregiver Education Details Patient Name: Date of Service: [name] 12/15/2023andnbsp9:30 A M Medical Record Number: [id number] Patient Account Number: [id number] Date of Birth/Gender: Treating RN: [birth year] (61 y.o. Oval Linsey Primary Care Physician: Bernardo Heater Other Clinician: Massie Kluver Referring Physician: Treating Physician/Extender: [name]:  [address], [place] ([id number]) [phone].pdf Page 7 of 8 Education Assessment Education Provided To: Patient Education Topics Provided Wound/Skin Impairment: Handouts: Other: Your wound has healed. Please call if any further issues arise Methods: Explain/Verbal Responses: State content correctly Electronic Signature(s) Signed: 01/21/2022 4:44:06 PM By: Massie Kluver Entered By: Massie Kluver on 01/17/2022 10:10:07 -------------------------------------------------------------------------------- Wound Assessment Details Patient Name: Date of Service: [name] 01/17/2022 9:30 A M Medical Record Number: [id number] Patient Account Number: [id number] Date of Birth/Sex: Treating RN: August 07, 1960 (61 y.o. Jerilynn Mages) Carlene Coria Primary Care Shean Gerding: Bernardo Heater Other Clinician: Massie Kluver Referring Derry Kassel: Treating Cliffton Spradley/Extender: [name]: 10 Wound Status Wound Number: 1 Primary Diabetic Wound/Ulcer of the Lower Extremity Etiology: Wound Location: Right, Plantar T Great oe Wound Healed - Epithelialized Wounding Event: Gradually Appeared Status: Date Acquired: 02/15/2020 Comorbid Coronary Artery Disease, Hypertension, Peripheral Venous Weeks Of Treatment: 10 History: Disease, Type II Diabetes Clustered Wound: No Photos Wound Measurements Length: (cm) Width: (cm) Depth: (cm) Area: (cm) Volume: (cm) 0 % Reduction in Area: 100% 0 % Reduction in Volume: 100% 0 Epithelialization: Large (  67-100%) 0 Tunneling: No 0 Undermining: No Wound Description Classification: Grade 2 Exudate Amount: None Present Felling, Edmond [name] ([id number]) Foul Odor After Cleansing: No Slough/Fibrino No [id number] Page 8 of 8 Wound Bed Granulation Amount: None Present (0%) Exposed Structure Necrotic Amount: None Present (0%) Fascia Exposed: No Fat Layer (Subcutaneous Tissue) Exposed:  Yes Tendon Exposed: No Muscle Exposed: No Joint Exposed: No Bone Exposed: No Treatment Notes Wound #1 (Toe Great) Wound Laterality: Plantar, Right Cleanser Peri-Wound Care Topical Primary Dressing Secondary Dressing Secured With Compression Wrap Compression Stockings Add-Ons Electronic Signature(s) Signed: 01/18/2022 3:52:05 PM By: Carlene Coria RN Signed: 01/21/2022 4:44:06 PM By: Massie Kluver Entered By: Massie Kluver on 01/17/2022 09:54:34 -------------------------------------------------------------------------------- [place] Details Patient Name: Date of Service: [name]. 01/17/2022 9:30 A M Medical Record Number: [id number] Patient Account Number: [id number] Date of Birth/Sex: Treating RN: 09/10/1960 (61 y.o. Jerilynn Mages) Carlene Coria Primary Care Matty Vanroekel: Bernardo Heater Other Clinician: Massie Kluver Referring Dealva Lafoy: Treating Ivania Teagarden/Extender: Massie Kluver in Treatment: 10 Vital Signs Time Taken: 09:32 Temperature (F): 97.3 Height (in): 77 Pulse (bpm): 56 Weight (lbs): 262 Respiratory Rate (breaths/min): 18 Body Mass Index (BMI): 31.1 Blood Pressure (mmHg): 187/92 Reference Range: 80 - 120 mg / dl Electronic Signature(s) Signed: 01/21/2022 4:44:06 PM By: Massie Kluver Entered By: Massie Kluver on 01/17/2022 09:34:27

## 2022-02-11 ENCOUNTER — Ambulatory Visit (INDEPENDENT_AMBULATORY_CARE_PROVIDER_SITE_OTHER): Payer: HMO | Admitting: Podiatry

## 2022-02-11 DIAGNOSIS — Z89512 Acquired absence of left leg below knee: Secondary | ICD-10-CM

## 2022-02-11 DIAGNOSIS — E1159 Type 2 diabetes mellitus with other circulatory complications: Secondary | ICD-10-CM | POA: Diagnosis not present

## 2022-02-11 DIAGNOSIS — B351 Tinea unguium: Secondary | ICD-10-CM | POA: Diagnosis not present

## 2022-02-11 DIAGNOSIS — L97512 Non-pressure chronic ulcer of other part of right foot with fat layer exposed: Secondary | ICD-10-CM

## 2022-02-11 DIAGNOSIS — M79674 Pain in right toe(s): Secondary | ICD-10-CM

## 2022-02-11 DIAGNOSIS — M2041 Other hammer toe(s) (acquired), right foot: Secondary | ICD-10-CM

## 2022-02-11 NOTE — Progress Notes (Signed)
Subjective: Chief Complaint  Patient presents with   Foot Problem    Nail trim per request and eval for diabetic shoes      62 year old male with above concerns.  He states that the wound is healed on the right side any was seen in the wound care center.  No drainage or pus.  No new open lesions.  No fevers or chills.  No other concerns.    Objective: AAO x3, NAD DP/PT pulses palpable bilaterally, CRT less than 3 seconds Preulcerative lesion on the right hallux there is no skin breakdown noted today.  There is no other open lesions. Nails are hypertrophic, dystrophic, brittle, discolored, elongated 5. No surrounding redness or drainage. Tenderness nails 1-5 on the right. No open lesions or pre-ulcerative lesions are identified today. 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  -Sharply debrided nails x 5 without any complications or bleeding. -I do think he would benefit from new diabetic shoes to help offload however it reoccurrence of the wound.  To be measured for diabetic shoes. -Daily foot inspection, glucose control.  Trula Slade DPM

## 2022-02-19 ENCOUNTER — Other Ambulatory Visit: Payer: HMO

## 2022-02-21 ENCOUNTER — Other Ambulatory Visit: Payer: HMO

## 2022-03-03 DIAGNOSIS — Z299 Encounter for prophylactic measures, unspecified: Secondary | ICD-10-CM | POA: Diagnosis not present

## 2022-03-03 DIAGNOSIS — E1140 Type 2 diabetes mellitus with diabetic neuropathy, unspecified: Secondary | ICD-10-CM | POA: Diagnosis not present

## 2022-03-03 DIAGNOSIS — Z794 Long term (current) use of insulin: Secondary | ICD-10-CM | POA: Diagnosis not present

## 2022-03-03 DIAGNOSIS — E1165 Type 2 diabetes mellitus with hyperglycemia: Secondary | ICD-10-CM | POA: Diagnosis not present

## 2022-03-09 ENCOUNTER — Other Ambulatory Visit: Payer: Self-pay | Admitting: Cardiology

## 2022-03-11 DIAGNOSIS — N189 Chronic kidney disease, unspecified: Secondary | ICD-10-CM | POA: Diagnosis not present

## 2022-03-11 DIAGNOSIS — E8722 Chronic metabolic acidosis: Secondary | ICD-10-CM | POA: Diagnosis not present

## 2022-03-11 DIAGNOSIS — E1122 Type 2 diabetes mellitus with diabetic chronic kidney disease: Secondary | ICD-10-CM | POA: Diagnosis not present

## 2022-03-11 DIAGNOSIS — R809 Proteinuria, unspecified: Secondary | ICD-10-CM | POA: Diagnosis not present

## 2022-03-11 DIAGNOSIS — I5032 Chronic diastolic (congestive) heart failure: Secondary | ICD-10-CM | POA: Diagnosis not present

## 2022-03-11 DIAGNOSIS — E211 Secondary hyperparathyroidism, not elsewhere classified: Secondary | ICD-10-CM | POA: Diagnosis not present

## 2022-03-11 DIAGNOSIS — E1129 Type 2 diabetes mellitus with other diabetic kidney complication: Secondary | ICD-10-CM | POA: Diagnosis not present

## 2022-03-11 DIAGNOSIS — I129 Hypertensive chronic kidney disease with stage 1 through stage 4 chronic kidney disease, or unspecified chronic kidney disease: Secondary | ICD-10-CM | POA: Diagnosis not present

## 2022-03-26 ENCOUNTER — Ambulatory Visit: Payer: HMO | Admitting: Cardiology

## 2022-03-31 ENCOUNTER — Ambulatory Visit (INDEPENDENT_AMBULATORY_CARE_PROVIDER_SITE_OTHER): Payer: HMO

## 2022-03-31 DIAGNOSIS — M2041 Other hammer toe(s) (acquired), right foot: Secondary | ICD-10-CM

## 2022-03-31 DIAGNOSIS — E1159 Type 2 diabetes mellitus with other circulatory complications: Secondary | ICD-10-CM

## 2022-03-31 DIAGNOSIS — Z89512 Acquired absence of left leg below knee: Secondary | ICD-10-CM

## 2022-03-31 NOTE — Progress Notes (Signed)
Patient presents to the office today for diabetic shoe and insole measuring.  Patient was measured with brannock device to determine size and width for 1 pair of extra depth shoes and foam casted for 3 pair of insoles.   ABN signed.   Documentation of medical necessity will be sent to patient's treating diabetic doctor to verify and sign.   Patient's diabetic provider: Ralph Leyden, FNP (Ashish C. Manuella Ghazi M.D.)  Shoes and insoles will be ordered at that time and patient will be notified for an appointment for fitting when they arrive.   Brannock measurement: 13 XW  Patient shoe selection-   1st   Shoe choice:   617  Shoe size ordered: 13 XW

## 2022-04-02 ENCOUNTER — Ambulatory Visit (INDEPENDENT_AMBULATORY_CARE_PROVIDER_SITE_OTHER): Payer: HMO

## 2022-04-02 ENCOUNTER — Ambulatory Visit: Payer: HMO | Attending: Cardiology | Admitting: Nurse Practitioner

## 2022-04-02 ENCOUNTER — Encounter: Payer: Self-pay | Admitting: Nurse Practitioner

## 2022-04-02 VITALS — BP 130/78 | HR 75 | Ht 77.0 in | Wt 262.0 lb

## 2022-04-02 DIAGNOSIS — R002 Palpitations: Secondary | ICD-10-CM | POA: Diagnosis not present

## 2022-04-02 DIAGNOSIS — E785 Hyperlipidemia, unspecified: Secondary | ICD-10-CM | POA: Diagnosis not present

## 2022-04-02 DIAGNOSIS — G4700 Insomnia, unspecified: Secondary | ICD-10-CM

## 2022-04-02 DIAGNOSIS — I10 Essential (primary) hypertension: Secondary | ICD-10-CM

## 2022-04-02 DIAGNOSIS — R400 Somnolence: Secondary | ICD-10-CM

## 2022-04-02 NOTE — Progress Notes (Signed)
Office Visit    Patient Name: [name] Date of Encounter: 04/02/2022  PCP:  Ralph Leyden, [place]  Cardiologist:  Carlyle Dolly, MD  Advanced Practice Provider:  Finis Bud, NP Electrophysiologist:  None   Chief Complaint    [name] is a 62 y.o. male with a hx of chest pain, hyperlipidemia, hypertension, type 2 diabetes, history of TIA, bradycardia, CKD, who presents today for  6 month follow-up.   Past Medical History    Past Medical History:  Diagnosis Date   Allergic rhinitis, cause unspecified    Anginal pain ([place])    Arthritis    BPH (benign prostatic hyperplasia)    Cervicalgia    Chest pain Sept. 2005   multiple caths  /  cath 06/15/2010..normal coronaries,  EF 60%   (false postive nuclear in the past)   CKD (chronic kidney disease), stage III (HCC)    Complication of anesthesia    Hard time waking up patient stated low blood pressures around 2017   Dyslipidemia    mixed   Esophageal reflux    Fibromyalgia    Gastroparesis    Headache(784.0)    History of kidney stones    Hyperlipidemia    Hypertension    Hypoplasia of one kidney    IDDM (insulin dependent diabetes mellitus)    Lumbago    Neuropathy associated with endocrine disorder ([place])    Overweight(278.02)    Peripheral vascular disease ([place])    Pulmonary embolus (HCC)    Hx of small left lower lobe  pulmonary embolus   Pulmonary embolus ([place]) 2010ish   Renal insufficiency    Stroke [facility])    mini stroke aug 2016   Type II or unspecified type diabetes mellitus with neurological manifestations, not stated as uncontrolled(250.60)    Urticaria, unspecified    Wears glasses    Past Surgical History:  Procedure Laterality Date   ACHILLES TENDON REPAIR Left 2013   AMPUTATION Left 06/02/2013   Procedure: AMPUTATION 1ST TOE LEFT FOOT;  Surgeon: Marcheta Grammes, DPM;  Location: [facility];  Service: Orthopedics;  Laterality: Left;    AMPUTATION Left 07/21/2013   Procedure: PARTIAL AMPUTATION 2ND TOE LEFT FOOT;  Surgeon: Marcheta Grammes, DPM;  Location: [facility];  Service: Podiatry;  Laterality: Left;   AMPUTATION Left 01/12/2019   Procedure: LEFT BELOW KNEE AMPUTATION;  Surgeon: Newt Minion, MD;  Location: [place];  Service: Orthopedics;  Laterality: Left;   ANTERIOR CERVICAL DECOMP/DISCECTOMY FUSION N/A 12/29/2016   Procedure: Cervical five-six Cervical six-seven Anterior cervical discectomy with fusion and plate fixation;  Surgeon: Ditty, Kevan Ny, MD;  Location: [place];  Service: Neurosurgery;  Laterality: N/A;   APPENDECTOMY     BACK SURGERY  1999   x 6 some fusions   BELOW KNEE LEG AMPUTATION Left 01/2019   BIOPSY  12/18/2017   Procedure: BIOPSY;  Surgeon: Rogene Houston, MD;  [facility];  Service: Endoscopy;;  gastric and esophagus   colonscopy     ESOPHAGOGASTRODUODENOSCOPY (EGD) WITH PROPOFOL N/A 12/18/2017   Procedure: ESOPHAGOGASTRODUODENOSCOPY (EGD) WITH PROPOFOL;  Surgeon: Rogene Houston, MD;  [facility];  Service: Endoscopy;  Laterality: N/A;  9:25   FOOT ARTHRODESIS Right 01/11/2014   Procedure: ARTHRODESIS INTERPHALANGEAL JOINT HALLUX RIGHT FOOT;  Surgeon: Marcheta Grammes, DPM;  Location: [facility];  Service: Podiatry;  Laterality: Right;   HARDWARE REMOVAL Right 01/17/2015  Procedure: HARDWARE REMOVAL;  Surgeon: Caprice Beaver, DPM;  Location: [facility];  Service: Podiatry;  Laterality: Right;   I & D EXTREMITY Left 12/22/2018   Procedure: LEFT PARTIAL CALCANEAL EXCISION;  Surgeon: Newt Minion, MD;  Location: [place];  Service: Orthopedics;  Laterality: Left;   KNEE ARTHROSCOPY Right 10/2015   LUMBAR LAMINECTOMY/DECOMPRESSION MICRODISCECTOMY Right 01/12/2013   Procedure: Right Lumbar Three-Four Laminotomy/Foraminotomy;  Surgeon: Floyce Stakes, MD;  [facility];  Service: Neurosurgery;  Laterality: Right;  Right Lumbar Three-Four  Laminotomy/Foraminotomy   NISSEN FUNDOPLICATION     SHOULDER ARTHROSCOPY W/ ROTATOR CUFF REPAIR Right    SHOULDER ARTHROSCOPY WITH DISTAL CLAVICLE RESECTION Right 10/04/2018   Procedure: RIGHT SHOULDER ARTHROSCOPY WITH DISTAL CLAVICLE RESECTION, EXTENSIVE DEBRIDEMENT, SUBACROMIAL PARTIAL ACROMIOPLASTY,;  Surgeon: Earlie Server, MD;  Location: [place];  Service: Orthopedics;  Laterality: Right;  PRE/POST OP SCALENE    Allergies  Allergies  Allergen Reactions   Bee Venom Swelling   Reglan [Metoclopramide] Itching and Rash    History of Present Illness    [name] is a 62 y.o. male with a past medical history as mentioned above.  Last seen by Dr. Carlyle Dolly on September 16, 2021.  Longstanding history of chest pain, history of benign caths.  NST in 2022 was normal, low risk.  Treated for possible microvascular disease with Ranexa, no longer was on Imdur.  Dr. Harl Bowie recommended taking Ranexa 1000 mg twice daily.    Today he presents for follow-up.  He admits to palpitations, noticed about 1-2 times per day, usually noted at night, last about 15 minutes, then go away. Denies any chest pain, shortness of breath, syncope, presyncope, dizziness, orthopnea, PND, swelling or significant weight changes, acute bleeding, or claudication.  Denies any alcohol use, tobacco use, energy use, and does report drinking coffee occasionally.  Does admit to getting 2-1/2 hours of sleep each night for years.  STOP-BANG score is 6.   EKGs/Labs/Other Studies Reviewed:   The following studies were reviewed today:   EKG:  EKG is ordered today.  The ekg ordered today demonstrates SR, 75 bpm, first degree AV block, nonspecific ST segment changes, otherwise nothing acute.   Cardiac monitor on 03/17/2021: 2 day monitor Rare supraventricular ectopy in the form of isolated PACs, couplets, triplets. 3 runs of SVT longest 8 beats Rare ventricular ectopy in the form of isolated PVCs No  symptoms reported     Patch Wear Time:  1 days and 23 hours (2023-02-02T08:45:31-0500 to 2023-02-04T08:20:16-0500)   Patient had a min HR of 47 bpm, max HR of 115 bpm, and avg HR of 63 bpm. Predominant underlying rhythm was Sinus Rhythm. First Degree AV Block was present. 3 Supraventricular Tachycardia runs occurred, the run with the fastest interval lasting 8 beats  with a max rate of 113 bpm (avg 105 bpm); the run with the fastest interval was also the longest. Isolated SVEs were rare (<1.0%), SVE Couplets were rare (<1.0%), and SVE Triplets were rare (<1.0%). Isolated VEs were rare (<1.0%), and no VE Couplets or  VE Triplets were present.  Myoview on 04/10/2020: There was no ST segment deviation noted during stress. The study is normal. Inferior defect most consistent with subdiaphragmatic attenuation. Cannot completely exclude prior mild inferior infarct. Either finding would support low risk. This is a low risk study. The left ventricular ejection fraction is normal (55-65%).  Echo on 03/14/2017: Study Conclusions   - Left ventricle: Inferobasal hypokinesis The cavity size  was    normal. There was mild focal basal hypertrophy of the septum.    Systolic function was normal. The estimated ejection fraction was    in the range of 55% to 60%. Wall motion was normal; there were no    regional wall motion abnormalities. Doppler parameters are    consistent with abnormal left ventricular relaxation (grade 1    diastolic dysfunction).  - Mitral valve: Valve area by pressure half-time: 1.55 cm^2.  - Left atrium: The atrium was mildly dilated.   Recent Labs: 04/19/2021: ALT 24 09/05/2021: BUN 34; Creat 2.37; Hemoglobin 15.0; Platelets 157; Potassium 4.5; Sodium 139  Recent Lipid Panel    Component Value Date/Time   CHOL 151 03/14/2017 1658   TRIG 111 03/14/2017 1658   HDL 33 (L) 03/14/2017 1658   CHOLHDL 4.6 03/14/2017 1658   VLDL 22 03/14/2017 1658   LDLCALC 96 03/14/2017 1658    Home  Medications   Current Meds  Medication Sig   acetaminophen (TYLENOL) 325 MG tablet Take 2 tablets (650 mg total) by mouth every 6 (six) hours as needed for up to 30 doses for mild pain or moderate pain.   aspirin EC 81 MG tablet Take 1 tablet (81 mg total) by mouth daily. Swallow whole.   atorvastatin (LIPITOR) 40 MG tablet TAKE 1 TABLET BY MOUTH EVERY DAY   calcitRIOL (ROCALTROL) 0.25 MCG capsule Take 0.25 mcg by mouth 3 (three) times daily. 3 times weekly (M-W-F)   carvedilol (COREG) 6.25 MG tablet Take 6.25 mg by mouth 2 (two) times daily.   EPINEPHrine 0.3 mg/0.3 mL IJ SOAJ injection Inject 0.3 mg into the muscle as needed for anaphylaxis.   gabapentin (NEURONTIN) 600 MG tablet 1 tab by mouth every morning & 3 tabs at bedtime   glipiZIDE (GLUCOTROL) 10 MG tablet Take 20 mg by mouth daily.   insulin detemir (LEVEMIR) 100 UNIT/ML injection Inject 50 Units into the skin in the morning. & 55 units at bedtime   Lancets (ONETOUCH DELICA PLUS 123XX123) MISC daily.   NOVOLOG FLEXPEN 100 UNIT/ML FlexPen Inject into the skin as directed. Per sliding scale   ONETOUCH ULTRA test strip USE TO TEST BLOOD SUGAR THREE TIMES DAILY   ranolazine (RANEXA) 1000 MG SR tablet TAKE 1 TABLET BY MOUTH TWICE DAILY   silver sulfADIAZINE (SILVADENE) 1 % cream Apply pea-sized amount to wound daily.   sodium bicarbonate 650 MG tablet Take 650 mg by mouth 2 (two) times daily.      Review of Systems    All other systems reviewed and are otherwise negative except as noted above.  Physical Exam    VS:  BP 130/78   Pulse 75   Ht '6\' 5"'$  (1.956 m)   Wt 262 lb (118.8 kg)   SpO2 97%   BMI 31.07 kg/m  , BMI Body mass index is 31.07 kg/m.  Wt Readings from Last 3 Encounters:  04/02/22 262 lb (118.8 kg)  09/16/21 260 lb (117.9 kg)  03/07/21 262 lb (118.8 kg)     GEN: Well nourished, well developed, in no acute distress. HEENT: normal. Neck: Supple, no JVD, carotid bruits, or masses. Cardiac: S1/S2, RRR, no  murmurs, rubs, or gallops. No clubbing, cyanosis, edema.  Radials/ Right PT 2+.  Respiratory:  Respirations regular and unlabored, clear to auscultation bilaterally. GI: Soft, nontender, nondistended. MS: L BKA, no swelling. Skin: Warm and dry, no rash. Neuro:  Strength and sensation are intact. Psych: Normal affect.  Assessment & Plan  Palpitations Noted as "fluttering" sensation, denies any tachycardia. Will arrange Zio monitor x 1 week. Heart healthy diet and regular cardiovascular exercise encouraged. Continue current medication regimen. Discussed to decrease caffeine consumption. Admits to insomnia, see below.  HTN BP stable. Discussed to monitor BP at home at least 2 hours after medications and sitting for 5-10 minutes. Continue current medication regimen. Heart healthy diet and regular cardiovascular exercise encouraged.   HLD LDL 10/2021 was 113. Continue atorvastatin. Recommend checking labs at next follow-up and if LDL is not at goal, plan to increase atorvastatin. Heart healthy diet and regular cardiovascular exercise encouraged. Continue to follow with PCP.  Insomnia, somnolence Stop Bang score 6. Needs an sleep study done to be performed in sleep clinic. Will arrange Pulmonary referral. Continue to follow with PCP.   Disposition: Follow up in 8 week(s) with Carlyle Dolly, MD or APP.  Signed, Finis Bud, NP 04/06/2022, 8:40 PM [place]

## 2022-04-02 NOTE — Patient Instructions (Signed)
Medication Instructions:  Your physician recommends that you continue on your current medications as directed. Please refer to the Current Medication list given to you today.   Labwork: None today  Testing/Procedures: ZIO XT- Long Term Monitor Instructions   Your physician has requested you wear your ZIO patch monitor______7_days.   This is a single patch monitor.  [employer] supplies one patch monitor per enrollment.  Additional stickers are not available.  Do not shower for the first 24 hours.  You may shower after the first 24 hours.   Press button if you feel a symptom. You will hear a small click.  Record Date, Time and Symptom in the Patient Log Book.   When you are ready to remove patch, follow instructions on last 2 pages of Patient Log Book.  Stick patch monitor onto last page of Patient Log Book.   Place Patient Log Book in [place] box.  Use locking tab on box and tape box closed securely.  The [employer] has [employer] on it.  Please place in mailbox as soon as possible.  Your physician should have your test results approximately 7 days after the monitor has been mailed back to [facility].   Call [facility] at [phone] if you have questions regarding your ZIO XT patch monitor.  Call them immediately if you see an orange light blinking on your monitor.   If your monitor falls off in less than 4 days contact our Monitor department at [phone].  If your monitor becomes loose or falls off after 4 days call [employer] at [phone] for suggestions on securing your monitor.    Follow-Up: 8 weeks   Any Other Special Instructions Will Be Listed Below (If Applicable).    You have been referred to Pulmonary in [place] for sleep study. They will call you to schedule appointment   If you need a refill on your cardiac medications before your next appointment, please call your pharmacy.

## 2022-04-07 DIAGNOSIS — Z299 Encounter for prophylactic measures, unspecified: Secondary | ICD-10-CM | POA: Diagnosis not present

## 2022-04-07 DIAGNOSIS — I700 Atherosclerosis of aorta: Secondary | ICD-10-CM | POA: Diagnosis not present

## 2022-04-07 DIAGNOSIS — N183 Chronic kidney disease, stage 3 unspecified: Secondary | ICD-10-CM | POA: Diagnosis not present

## 2022-04-07 DIAGNOSIS — E1165 Type 2 diabetes mellitus with hyperglycemia: Secondary | ICD-10-CM | POA: Diagnosis not present

## 2022-04-07 DIAGNOSIS — I10 Essential (primary) hypertension: Secondary | ICD-10-CM | POA: Diagnosis not present

## 2022-04-15 DIAGNOSIS — R002 Palpitations: Secondary | ICD-10-CM | POA: Diagnosis not present

## 2022-04-28 ENCOUNTER — Encounter: Payer: HMO | Attending: Physician Assistant | Admitting: Physician Assistant

## 2022-04-28 DIAGNOSIS — N183 Chronic kidney disease, stage 3 unspecified: Secondary | ICD-10-CM | POA: Insufficient documentation

## 2022-04-28 DIAGNOSIS — E1122 Type 2 diabetes mellitus with diabetic chronic kidney disease: Secondary | ICD-10-CM | POA: Diagnosis not present

## 2022-04-28 DIAGNOSIS — E11621 Type 2 diabetes mellitus with foot ulcer: Secondary | ICD-10-CM | POA: Diagnosis not present

## 2022-04-28 DIAGNOSIS — Z89512 Acquired absence of left leg below knee: Secondary | ICD-10-CM | POA: Insufficient documentation

## 2022-04-28 DIAGNOSIS — L97512 Non-pressure chronic ulcer of other part of right foot with fat layer exposed: Secondary | ICD-10-CM | POA: Diagnosis not present

## 2022-04-28 DIAGNOSIS — I129 Hypertensive chronic kidney disease with stage 1 through stage 4 chronic kidney disease, or unspecified chronic kidney disease: Secondary | ICD-10-CM | POA: Insufficient documentation

## 2022-04-28 DIAGNOSIS — L84 Corns and callosities: Secondary | ICD-10-CM | POA: Diagnosis not present

## 2022-04-28 NOTE — Progress Notes (Signed)
[name] ([id number]) [id number] Page 1 of 7 Visit Report for 04/28/2022 Allergy List Details Patient Name: Date of Service: [name] 04/28/2022 8:15 A M Medical Record Number: [id number] Patient Account Number: [id number] Date of Birth/Sex: Treating RN: 1960-02-15 (62 y.o. [name] Primary Care Jamair Cato: Bernardo Heater Other Clinician: Referring Natika Geyer: Treating Janica Eldred/Extender: Jeri Cos Self, Referral Weeks in Treatment: 0 Allergies Active Allergies bee venom protein (honey bee) Reglan Allergy Notes Electronic Signature(s) Signed: 04/28/2022 4:12:26 PM By: Levora Dredge Entered By: Levora Dredge on 04/28/2022 08:41:17 -------------------------------------------------------------------------------- Arrival Information Details Patient Name: Date of Service: [name] 04/28/2022 8:15 A M Medical Record Number: [id number] Patient Account Number: [id number] Date of Birth/Sex: Treating RN: 1960/06/17 (62 y.o. [name] Primary Care Eldean Klatt: Bernardo Heater Other Clinician: Referring Ahlana Slaydon: Treating Lue Sykora/Extender: Jeri Cos Self, Referral Weeks in Treatment: 0 Visit Information Patient Arrived: Ambulatory Arrival Time: 08:38 Accompanied By: self Transfer Assistance: None Patient Identification Verified: Yes Secondary Verification Process Completed: Yes Patient Has Alerts: Yes Patient Alerts: type 2 diabetic ABI 09/05/21 R 1.26 ABI 09/05/21 L 1.10 History Since Last Visit Added or deleted any medications: No Any new allergies or adverse reactions: No Had a fall or experienced change in activities of daily living that may affect risk of falls: Yes Hospitalized since last visit: No Has Dressing in Place as Prescribed: No Notes pt states fall yesterday 3/24 fell into bushes, prosthetic got caught up Electronic Signature(s) Signed: 04/28/2022 8:53:38 AM By: Levora Dredge Previous  Signature: 04/28/2022 8:51:52 AM Version By: Levora Dredge Entered By: Levora Dredge on 04/28/2022 08:53:37 -------------------------------------------------------------------------------- Clinic Level of Care Assessment Details Patient Name: Date of Service: [name], [name] 04/28/2022 8:15 A M Medical Record Number: [id number] Patient Account Number: [id number] Date of Birth/Sex: Treating RN: 11-Jul-1960 (62 y.o. [name] Primary Care Maliya Marich: Bernardo Heater Other Clinician: Referring Briana Farner: Treating Donnavan Covault/Extender: Jeri Cos Self, Referral Weeks in Treatment: 0 Clinic Level of Care Assessment Items [name], [name] ([id number]) [id number] Page 2 of 7 TOOL 1 Quantity Score []  - 0 Use when EandM and Procedure is performed on INITIAL visit ASSESSMENTS - Nursing Assessment / Reassessment X- 1 20 General Physical Exam (combine w/ comprehensive assessment (listed just below) when performed on new pt. evals) X- 1 25 Comprehensive Assessment (HX, ROS, Risk Assessments, Wounds Hx, etc.) ASSESSMENTS - Wound and Skin Assessment / Reassessment []  - 0 Dermatologic / Skin Assessment (not related to wound area) ASSESSMENTS - Ostomy and/or Continence Assessment and Care []  - 0 Incontinence Assessment and Management []  - 0 Ostomy Care Assessment and Management (repouching, etc.) PROCESS - Coordination of Care X - Simple Patient / Family Education for ongoing care 1 15 []  - 0 Complex (extensive) Patient / Family Education for ongoing care X- 1 10 Staff obtains Programmer, systems, Records, T Results / Process Orders est []  - 0 Staff telephones HHA, Nursing Homes / Clarify orders / etc []  - 0 Routine Transfer to another Facility (non-emergent condition) []  - 0 Routine Hospital Admission (non-emergent condition) X- 1 15 New Admissions / Biomedical engineer / Ordering NPWT Apligraf, etc. , []  - 0 Emergency Hospital Admission (emergent  condition) PROCESS - Special Needs []  - 0 Pediatric / Minor Patient Management []  - 0 Isolation Patient Management []  - 0 Hearing / Language / Visual special needs []  - 0 Assessment of Community assistance (transportation, D/C planning, etc.) []  - 0 Additional assistance / Altered mentation []  - 0 Support Surface(s) Assessment (bed, cushion, seat, etc.)  INTERVENTIONS - Miscellaneous []  - 0 External ear exam []  - 0 Patient Transfer (multiple staff / Civil Service fast streamer / Similar devices) []  - 0 Simple Staple / Suture removal (25 or less) []  - 0 Complex Staple / Suture removal (26 or more) []  - 0 Hypo/Hyperglycemic Management (do not check if billed separately) []  - 0 Ankle / Brachial Index (ABI) - do not check if billed separately Has the patient been seen at the hospital within the last three years: Yes Total Score: 85 Level Of Care: New/Established - Level 3 Electronic Signature(s) Signed: 04/28/2022 4:12:26 PM By: Levora Dredge Entered By: Levora Dredge on 04/28/2022 09:26:31 -------------------------------------------------------------------------------- Encounter Discharge Information Details Patient Name: Date of Service: [name]. 04/28/2022 8:15 A M Medical Record Number: [id number] Patient Account Number: [id number] Date of Birth/Sex: Treating RN: 1960-02-23 (62 y.o. [name] Primary Care Dustin Figuero: Bernardo Heater Other Clinician: Referring Shawnte Demarest: Treating Soumya Colson/Extender: Jeri Cos Self, Referral Weeks in Treatment: 0 Encounter Discharge Information Items Post Procedure Vitals Discharge Condition: Stable Temperature (F): 97.6 Arora, Brayson L ([id number]) [phone].pdf Page 3 of 7 Ambulatory Status: Ambulatory Pulse (bpm): 57 Discharge Destination: Home Respiratory Rate (breaths/min): 18 Transportation: Private Auto Blood Pressure (mmHg): 155/89 Accompanied By: self/wife Schedule Follow-up Appointment:  Yes Clinical Summary of Care: Electronic Signature(s) Signed: 04/28/2022 4:12:26 PM By: Levora Dredge Entered By: Levora Dredge on 04/28/2022 09:29:15 -------------------------------------------------------------------------------- Lower Extremity Assessment Details Patient Name: Date of Service: TALMAGE, HORRY 04/28/2022 8:15 A M Medical Record Number: [id number] Patient Account Number: [id number] Date of Birth/Sex: Treating RN: May 30, 1960 (62 y.o. [name] Primary Care Eydie Wormley: Bernardo Heater Other Clinician: Referring Clinten Howk: Treating Lilian Fuhs/Extender: Jeri Cos Self, Referral Weeks in Treatment: 0 Edema Assessment Assessed: [Left: No] [Right: No] Edema: [Left: N] [Right: o] Calf Left: Right: Point of Measurement: 40 cm From Medial Instep 37 cm Ankle Left: Right: Point of Measurement: 13 cm From Medial Instep 22.2 cm Vascular Assessment Pulses: Dorsalis Pedis Palpable: [Right:Yes] Posterior Tibial Palpable: [Right:Yes] Electronic Signature(s) Signed: 04/28/2022 4:12:26 PM By: Levora Dredge Entered By: Levora Dredge on 04/28/2022 08:41:04 -------------------------------------------------------------------------------- Multi-Disciplinary Care Plan Details Patient Name: Date of Service: [name]. 04/28/2022 8:15 A M Medical Record Number: [id number] Patient Account Number: [id number] Date of Birth/Sex: Treating RN: 11/08/1960 (62 y.o. [name] Primary Care Graycen Degan: Bernardo Heater Other Clinician: Referring Amera Banos: Treating Latreece Mochizuki/Extender: Jeri Cos Self, Referral Weeks in Treatment: 0 Active Inactive Orientation to the Wound Care Program Nursing Diagnoses: Knowledge deficit related to the wound healing center program Goals: Patient/caregiver will verbalize understanding of the Start Date Initiated: 04/28/2022 Target Resolution Date: 05/05/2022 NICHOLAI, BOERBOOM ([id number])  [id number] Page 4 of 7 Goal Status: Active Interventions: Provide education on orientation to the wound center Notes: Wound/Skin Impairment Nursing Diagnoses: Impaired tissue integrity Knowledge deficit related to ulceration/compromised skin integrity Goals: Ulcer/skin breakdown will have a volume reduction of 30% by week 4 Date Initiated: 04/28/2022 Target Resolution Date: 05/26/2022 Goal Status: Active Ulcer/skin breakdown will have a volume reduction of 50% by week 8 Date Initiated: 04/28/2022 Target Resolution Date: 06/23/2022 Goal Status: Active Ulcer/skin breakdown will have a volume reduction of 80% by week 12 Date Initiated: 04/28/2022 Target Resolution Date: 07/21/2022 Goal Status: Active Ulcer/skin breakdown will heal within 14 weeks Date Initiated: 04/28/2022 Target Resolution Date: 08/04/2022 Goal Status: Active Interventions: Assess patient/caregiver ability to obtain necessary supplies Assess patient/caregiver ability to perform ulcer/skin care regimen upon admission and as needed Assess ulceration(s) every visit Provide education on ulcer  and skin care Treatment Activities: Skin care regimen initiated : 04/28/2022 Notes: Electronic Signature(s) Signed: 04/28/2022 4:12:26 PM By: Levora Dredge Entered By: Levora Dredge on 04/28/2022 09:28:01 -------------------------------------------------------------------------------- Pain Assessment Details Patient Name: Date of Service: [name] 04/28/2022 8:15 A M Medical Record Number: [id number] Patient Account Number: [id number] Date of Birth/Sex: Treating RN: Jun 25, 1960 (62 y.o. [name] Primary Care Janiece Scovill: Bernardo Heater Other Clinician: Referring Jakeob Tullis: Treating Malon Siddall/Extender: Jeri Cos Self, Referral Weeks in Treatment: 0 Active Problems Location of Pain Severity and Description of Pain Patient Has Paino No Site Locations Rate the pain. Current Pain  Level: 0 Demma, Delores L ([id number]) [id number] Page 5 of 7 Pain Management and Medication Current Pain Management: Electronic Signature(s) Signed: 04/28/2022 4:12:26 PM By: Levora Dredge Entered By: Levora Dredge on 04/28/2022 08:39:27 -------------------------------------------------------------------------------- Patient/Caregiver Education Details Patient Name: Date of Service: [name] 3/25/2024andnbsp8:15 A M Medical Record Number: [id number] Patient Account Number: [id number] Date of Birth/Gender: Treating RN: 16-Mar-1960 (62 y.o. [name] Primary Care Physician: Bernardo Heater Other Clinician: Referring Physician: Treating Physician/Extender: Jeri Cos Self, Referral Weeks in Treatment: 0 Education Assessment Education Provided To: Patient Education Topics Provided Welcome T [facility]-New Patient Packet: o Handouts: The Wound Healing Pledge form, Welcome T The [place] o Methods: Explain/Verbal Responses: State content correctly Wound Debridement: Handouts: Wound Debridement Methods: Explain/Verbal Responses: State content correctly Wound/Skin Impairment: Handouts: Caring for Your Ulcer Methods: Explain/Verbal Responses: State content correctly Electronic Signature(s) Signed: 04/28/2022 4:12:26 PM By: Levora Dredge Entered By: Levora Dredge on 04/28/2022 09:28:23 -------------------------------------------------------------------------------- Wound Assessment Details Patient Name: Date of Service: [name] 04/28/2022 8:15 A M Medical Record Number: [id number] Patient Account Number: [id number] Date of Birth/Sex: Treating RN: August 20, 1960 (62 y.o. [name] Primary Care Shai Mckenzie: Bernardo Heater Other Clinician: Referring Mamye Bolds: Treating Glenda Spelman/Extender: Jeri Cos Self, Referral Weeks in Treatment: 0 Wound Status Wound Number: 2 Primary Diabetic Wound/Ulcer of  the Lower Extremity Etiology: Wound Location: Right T Great oe Wound Open Wounding Event: Gradually Appeared Status: Date Acquired: 04/04/2022 Comorbid Coronary Artery Disease, Hypertension, Peripheral Venous Weeks Of Treatment: 0 History: Disease, Type II Diabetes, Neuropathy Clustered Wound: No Photos Garzon, Tonie L ([id number]) [id number] Page 6 of 7 Wound Measurements Length: (cm) 0.4 Width: (cm) 0.3 Depth: (cm) 0.2 Area: (cm) 0.094 Volume: (cm) 0.019 % Reduction in Area: % Reduction in Volume: Epithelialization: None Tunneling: No Undermining: No Wound Description Classification: Grade 1 Exudate Amount: Medium Exudate Type: Serosanguineous Exudate Color: red, brown Foul Odor After Cleansing: No Slough/Fibrino Yes Wound Bed Granulation Amount: None Present (0%) Exposed Structure Necrotic Amount: Large (67-100%) Fat Layer (Subcutaneous Tissue) Exposed: Yes Necrotic Quality: Adherent Slough Assessment Notes callus build up Treatment Notes Wound #2 (Toe Great) Wound Laterality: Right Cleanser Soap and Water Discharge Instruction: Gently cleanse wound with antibacterial soap, rinse and pat dry prior to dressing wounds Peri-Wound Care Topical Primary Dressing [place] Small 2x2 (in/in) Discharge Instruction: [place] 2x2 (in/in) as instructed Secondary Dressing Coverlet Latex-Free Fabric Adhesive Dressings Discharge Instruction: Knuckle Secured With Compression Wrap Compression Stockings Add-Ons Electronic Signature(s) Signed: 04/28/2022 4:12:26 PM By: Levora Dredge Entered By: Levora Dredge on 04/28/2022 09:30:35 -------------------------------------------------------------------------------- Vitals Details Patient Name: Date of Service: [name]. 04/28/2022 8:15 A M Medical Record Number: [id number] Patient Account Number: [id number] Date of Birth/Sex: Treating RN: Oct 10, 1960 (62 y.o. [name] Primary Care Addalynn Kumari: Bernardo Heater Other Clinician: ZHAYDEN, LONGOBARDI ([id number]) [id number] Page 7 of 7 Referring Shrey Boike:  Treating Georgiann Neider/Extender: Jeri Cos Self, Referral Weeks in Treatment: 0 Vital Signs Time Taken: 08:39 Temperature (F): 97.6 Height (in): 77 Pulse (bpm): 57 Source: Stated Respiratory Rate (breaths/min): 18 Weight (lbs): 257 Blood Pressure (mmHg): 155/89 Source: Stated Reference Range: 80 - 120 mg / dl Body Mass Index (BMI): 30.5 Electronic Signature(s) Signed: 04/28/2022 4:12:26 PM By: Levora Dredge Entered By: Levora Dredge on 04/28/2022 08:40:01

## 2022-04-28 NOTE — Progress Notes (Signed)
[name] ([id number]) [phone] [id number] Page 1 of 4 Visit Report for 04/28/2022 Abuse Risk Screen Details Patient Name: Date of Service: [name], [name] 04/28/2022 8:15 A M Medical Record Number: [id number] Patient Account Number: [id number] Date of Birth/Sex: Treating RN: Aug 04, 1960 (62 y.o. [name] Primary Care [name]: [name] Other Clinician: Referring [name]: Treating [name]/Extender: [name], Referral Weeks in Treatment: 0 Abuse Risk Screen Items Answer ABUSE RISK SCREEN: Has anyone close to you tried to hurt or harm you recentlyo No Do you feel uncomfortable with anyone in your familyo No Has anyone forced you do things that you didnt want to doo No Electronic Signature(s) Signed: 04/28/2022 4:12:26 PM By: [name] Entered By: [name] on 04/28/2022 08:44:14 -------------------------------------------------------------------------------- Activities of Daily Living Details Patient Name: Date of Service: [name], [name] 04/28/2022 8:15 A M Medical Record Number: [id number] Patient Account Number: [id number] Date of Birth/Sex: Treating RN: [birth year] (62 y.o. [name] Primary Care [name]: [name] Other Clinician: Referring [name]: Treating [name]/Extender: [name], Referral Weeks in Treatment: 0 Activities of Daily Living Items Answer Activities of Daily Living (Please select one for each item) Drive Automobile Completely Able T Medications ake Completely Able Use T elephone Completely Able Care for Appearance Completely Able Use T oilet Completely Able Bath / Shower Completely Able Dress [name] Completely Able Feed [name] Completely Able Walk Completely Able Get In / Out Bed Completely Able Housework Completely Able Prepare Meals Completely [place] for [name] Completely Able Electronic Signature(s) Signed: 04/28/2022  4:12:26 PM By: [name] Entered By: [name] on 04/28/2022 08:44:36 -------------------------------------------------------------------------------- Education Screening Details Patient Name: Date of Service: [name] 04/28/2022 8:15 A M Medical Record Number: [id number] Patient Account Number: [id number] Date of Birth/Sex: Treating RN: [birth year] (62 y.o. [name] Primary Care [name]: [name] Other Clinician: Referring [name]: Treating [name]/Extender: [name], Referral Weeks in Treatment: 0 [name] ([id number]) [phone] [id number] Page 2 of 4 Learning Preferences/Education Level/Primary Language Learning Preference: Explanation, Demonstration, Video, Data processing manager, Printed Material Preferred Language: Diplomatic Services operational officer Language Barrier: No Translator Needed: No Memory Deficit: No Emotional Barrier: No Cultural/Religious Beliefs Affecting Medical Care: No Physical Barrier Impaired Vision: No Impaired Hearing: No Decreased Hand dexterity: No Knowledge/Comprehension Knowledge Level: Medium Comprehension Level: Medium Ability to understand written instructions: Medium Ability to understand verbal instructions: Medium Motivation Anxiety Level: Calm Cooperation: Cooperative Education Importance: Acknowledges Need Interest in Health Problems: Asks Questions Perception: Coherent Willingness to Engage in [name]-Management High Activities: Readiness to Engage in [name]-Management High Activities: Electronic Signature(s) Signed: 04/28/2022 4:12:26 PM By: [name] Entered By: [name] on 04/28/2022 08:44:53 -------------------------------------------------------------------------------- Fall Risk Assessment Details Patient Name: Date of Service: [name]. 04/28/2022 8:15 A M Medical Record Number: [id number] Patient Account Number: [id number] Date of  Birth/Sex: Treating RN: 10/06/1960 (62 y.o. [name] Primary Care [name]: [name] Other Clinician: Referring Kemper Hochman: Treating [name]/Extender: [name], Referral Weeks in Treatment: 0 Fall Risk Assessment Items Have you had 2 or more falls in the last 12 monthso 0 No Have you had any fall that resulted in injury in the last 12 monthso 0 No FALLS RISK SCREEN History of falling - immediate or within 3 months 25 Yes Secondary diagnosis (Do you have 2 or more medical diagnoseso) 0 No Ambulatory aid None/bed rest/wheelchair/nurse 0 Yes Crutches/cane/walker 0 No Furniture 0 No Intravenous therapy Access/Saline/Heparin Lock 0 No Gait/Transferring Normal/ bed  rest/ wheelchair 0 Yes Weak (short steps with or without shuffle, stooped but able to lift [name] while walking, may seek 0 No support from furniture) Impaired (short steps with shuffle, may have difficulty arising from chair, [name] down, impaired 0 No balance) Mental Status Oriented to own ability 0 Yes Electronic Signature(s) [name] ([id number]) [phone] [id number] Page 3 of 4 Signed: 04/28/2022 4:12:26 PM By: [name] Entered By: [name] on 04/28/2022 08:45:10 -------------------------------------------------------------------------------- Foot Assessment Details Patient Name: Date of Service: [name], [name] 04/28/2022 8:15 A M Medical Record Number: [id number] Patient Account Number: [id number] Date of Birth/Sex: Treating RN: April 29, 1960 (62 y.o. [name] Primary Care [name]: [name] Other Clinician: Referring [name]: Treating [name]/Extender: [name], Referral Weeks in Treatment: 0 Foot Assessment Items Site Locations + = Sensation present, - = Sensation absent, C = Callus, U = Ulcer R = Redness, W = Warmth, M = Maceration, PU = Pre-ulcerative lesion F = Fissure, S = Swelling, D = Dryness Assessment Right:  Left: Other Deformity: No No Prior Foot Ulcer: No No Prior Amputation: No Yes Charcot Joint: No No Ambulatory Status: Ambulatory Without Help Gait: Steady Electronic Signature(s) Signed: 04/28/2022 4:12:26 PM By: [name] Entered By: [name] on 04/28/2022 08:46:18 -------------------------------------------------------------------------------- Nutrition Risk Screening Details Patient Name: Date of Service: [name], [name] 04/28/2022 8:15 A M Medical Record Number: [id number] Patient Account Number: [id number] Date of Birth/Sex: Treating RN: [birth year] (62 y.o. [name] Primary Care Elaya Droege: [name] Other Clinician: Referring Nhung Danko: Treating Annaleia Pence/Extender: [name], Referral Weeks in Treatment: 0 Height (in): 77 Weight (lbs): 257 Body Mass Index (BMI): 30.5 Ojeda, Wymon [name] ([id number]) [id number] Page 4 of 4 Nutrition Risk Screening Items Score Screening NUTRITION RISK SCREEN: I have an illness or condition that made me change the kind and/or amount of food I eat 0 No I eat fewer than two meals per day 0 No I eat few fruits and vegetables, or milk products 0 No I have three or more drinks of beer, liquor or wine almost every day 0 No I have tooth or mouth problems that make it hard for me to eat 0 No I don't always have enough money to buy the food I need 0 No I eat alone most of the time 0 No I take three or more different prescribed or over-the-counter drugs a day 0 No Without wanting to, I have lost or gained 10 pounds in the last six months 0 No I am not always physically able to shop, cook and/or feed myself 0 No Nutrition Protocols Good Risk Protocol 0 No interventions needed Moderate Risk Protocol High Risk Proctocol Risk Level: Good Risk Score: 0 Electronic Signature(s) Signed: 04/28/2022 4:12:26 PM By: [name] Entered By: [name] on 04/28/2022 08:45:27

## 2022-04-28 NOTE — Progress Notes (Signed)
Dustin, [name] ([id number]) [id number] Page 1 of 9 Visit Report for 04/28/2022 Chief Complaint Document Details Patient Name: Date of Service: [name], [name] 04/28/2022 8:15 A M Medical Record Number: [id number] Patient Account Number: [id number] Date of Birth/Sex: Treating RN: 18-Jun-1960 (62 y.o. [name] Primary Care Provider: Bernardo [name] Other Clinician: Referring Provider: Treating Provider/Extender: [name] Self, Referral Weeks in Treatment: 0 Information Obtained from: Patient Chief Complaint Right great callus buildup with concern for underlying ulcer Electronic Signature(s) Signed: 04/28/2022 9:07:21 AM By: Dustin Keeler PA-C Entered By: [name] on 04/28/2022 09:07:21 -------------------------------------------------------------------------------- Debridement Details Patient Name: Date of Service: [name]. 04/28/2022 8:15 A M Medical Record Number: [id number] Patient Account Number: [id number] Date of Birth/Sex: Treating RN: 31-Aug-1960 (62 y.o. [name] Primary Care Provider: Bernardo [name] Other Clinician: Referring Provider: Treating Provider/Extender: [name] Self, Referral Weeks in Treatment: 0 Debridement Performed for Assessment: Wound #2 Right T Great oe Performed By: Physician [name]., PA-C Debridement Type: Debridement Severity of Tissue Pre Debridement: Limited to breakdown of skin Level of Consciousness (Pre-procedure): Awake and Alert Pre-procedure Verification/Time Out Yes - 09:10 Taken: Pain Control: Lidocaine 4% T opical Solution T Area Debrided ([name] x W): otal 0.3 (cm) x 0.5 (cm) = 0.15 (cm) Tissue and other material debrided: Viable, Non-Viable, Callus, Slough, Subcutaneous, Slough Level: Skin/Subcutaneous Tissue Debridement Description: Excisional Instrument: Curette Bleeding: Moderate Hemostasis Achieved: Silver Nitrate Response to Treatment: Procedure was  tolerated well Level of Consciousness (Post- Awake and Alert procedure): Post Debridement Measurements of Total Wound Length: (cm) 0.4 Width: (cm) 0.3 Depth: (cm) 0.2 Volume: (cm) 0.019 Character of Wound/Ulcer Post Debridement: Stable Severity of Tissue Post Debridement: Fat layer exposed Post Procedure Diagnosis Same as Pre-procedure Electronic Signature(s) Signed: 04/28/2022 4:12:26 PM By: [name] Signed: 04/28/2022 4:46:02 PM By: Dustin Keeler PA-C Entered By: [name] on 04/28/2022 09:24:46 [name], [name] ([id number]) [id number] Page 2 of 9 -------------------------------------------------------------------------------- HPI Details Patient Name: Date of Service: Dustin, [name] 04/28/2022 8:15 A M Medical Record Number: [id number] Patient Account Number: [id number] Date of Birth/Sex: Treating RN: 1960/09/25 (62 y.o. [name] Primary Care Provider: Bernardo [name] Other Clinician: Referring Provider: Treating Provider/Extender: [name] Self, Referral Weeks in Treatment: 0 History of Present Illness HPI Description: 11-07-2021 upon evaluation today patient appears to be doing somewhat poorly in regard to a wound on his right plantar great toe. Fortunately there does not appear to be any evidence of active infection locally or systemically at this time which is great news. No fevers, chills, nausea, vomiting, or diarrhea. With that being said he tells me that he really has not been noticing a lot of improvement either with regard to the treatment. He has been under the care of [name] up to this point for roughly a year. More recently he did have an MRI which revealed the potential for possible osteomyelitis. With that being said I do believe that he definitely should probably be monitored for this but to be honest the Elesa Hacker that he was given probably did a good job. Considering there was not much being seen on MRI  I do not think there is a high likelihood of severe osteomyelitis at this point we will definitely keep an eye on things however. Nonetheless right now has been utilizing Silvadene cream I definitely think we can find something better to help get this moving in the right direction. Patient does have a history of diabetes mellitus type  2, chronic kidney disease stage III, hypertension, and again he has had an amputation of the left leg currently. The right leg is the 1 that we are actually treating the great toe wound on. He has a below-knee amputation. 11-11-2021 upon evaluation today patient's wound actually is showing signs of excellent improvement. I am actually very pleased even compared to last week this is already better. He is very happy as well. 12-02-2021 upon evaluation today patient appears to be doing pretty well in regard to his toe ulcer this been several weeks since I saw him part of it was our scheduling with me being out of town and part of it was he actually missed an appointment secondary to his trunk the will actually fell off and he had some issues there getting it fixed on Monday so he missed his appointment last Monday. Nonetheless the good news is in today things appear to be doing well no signs of infection though he does have a pretty good need for sharp debridement to try to clear some of this away. 11/6; right first toes ulcer complicated by his left-sided BKA.Marland KitchenNo major change from last visit 12-16-2021 upon evaluation today patient appears to be doing well currently in regard to his toe ulcer that was not making a good amount of improvement yet I do believe the total contact cast is really good to be necessary. I talked about that before he tells me his wife can drive him around he is done with working now also he can actually look towards doing this. 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 12-30-2021 upon evaluation today patient appears to be doing well currently in regard to his foot ulcer on the toe although he is still building up quite a bit of callus. Will actually get the total contact cast on him today and started which I think is good to be very beneficial. 01-02-2022 upon evaluation today patient appears to be doing much better in regard to his wound. The cast does seem to do excellent for him. Fortunately there does not appear to be any evidence of active infections at this time. 01-09-2022 upon evaluation today patient appears to be doing excellent in regard to his wound. He is actually showing signs of excellent improvement and in general I think were very close to complete resolution. In fact there might be just a really tiny area, 0.1, still open but again this is much better than where we were prior to put the cast on. Its only been a little over a week since we put the first cast in place. 01-17-2022 upon 11 valuation today patient appears to be doing well currently in regard to his toe ulcer. In fact he appears to be completely healed based on what I am seeing at this point. I do not see any evidence of anything open. Readmission: 04-28-2022 upon evaluation today patient presents for reevaluation here in the clinic I actually saw him last January 17, 2022. At that point he was completely healed. With that being said unfortunately he notes that he has been having some callus buildup in regard to the toe and he figured he should come in and get this checked out sooner rather than later. This is the medial aspect of the right great toe again he has a below-knee amputation on the left. Fortunately I do not  see any signs of infection there is a lot of callus that was not definitively open when I first saw this but we did perform debridement to clear this away that we detailed in the objective section. Electronic Signature(s) Signed:  04/30/2022 9:59:19 AM By: Dustin Keeler PA-C Previous Signature: 04/28/2022 4:45:10 PM Version By: Dustin Keeler PA-C Entered By: [name] on 04/30/2022 09:59:19 -------------------------------------------------------------------------------- Physical Exam Details Patient Name: Date of Service: [name], [name] 04/28/2022 8:15 A M Medical Record Number: [id number] Patient Account Number: [id number] Date of Birth/Sex: Treating RN: Jul 14, 1960 (62 y.o. [name] Primary Care Provider: Bernardo [name] Other Clinician: Referring Provider: Treating Provider/Extender: [name] Self, Referral Weeks in Treatment: 0 Constitutional patient is hypertensive.. pulse regular and within target range for patient.[name] respirations regular, non-labored and within target range for patient.[name] temperature within target range for patient.. Well-nourished and well-hydrated in no acute distress. Eyes Rotert, Moataz [name] ([id number]) [id number] Page 3 of 9 conjunctiva clear no eyelid edema noted. pupils equal round and reactive to light and accommodation. Ears, Nose, Mouth, and Throat no gross abnormality of ear auricles or external auditory canals. normal hearing noted during conversation. mucus membranes moist. Respiratory normal breathing without difficulty. Cardiovascular 2+ dorsalis pedis/posterior tibialis pulses. no clubbing, cyanosis, significant edema, <3 sec cap refill. Musculoskeletal Patient unable to walk without assistance. Psychiatric this patient is able to make decisions and demonstrates good insight into disease process. Alert and Oriented x 3. pleasant and cooperative. Notes Upon inspection patient's wound bed actually showed signs of significant callus buildup I was not certain there was a wound underneath that first I did remove the callus carefully however I was able to expose the fact that there was a wound underlying. In the end I remove  slough, biofilm, and callus down to good subcutaneous tissue which patient tolerated today without complication and postdebridement this actually looks to be doing much better the wound is not extremely large but nonetheless I am happy to take care of it now and not allow it to get much worse. He again has good arterial flow ABIs previously were excellent. He does have a left below-knee amputation. He uses a prosthesis for ambulation. He does stay very active however. Electronic Signature(s) Signed: 04/30/2022 10:00:30 AM By: Dustin Keeler PA-C Entered By: [name] on 04/30/2022 10:00:30 -------------------------------------------------------------------------------- Physician Orders Details Patient Name: Date of Service: [name] 04/28/2022 8:15 A M Medical Record Number: [id number] Patient Account Number: [id number] Date of Birth/Sex: Treating RN: 10/29/1960 (62 y.o. [name] Primary Care Provider: Bernardo [name] Other Clinician: Referring Provider: Treating Provider/Extender: [name] Self, Referral Weeks in Treatment: 0 Verbal / Phone Orders: No Diagnosis Coding ICD-10 Coding Code Description E11.621 Type 2 diabetes mellitus with foot ulcer L84 Corns and callosities N18.30 Chronic kidney disease, stage 3 unspecified I10 Essential (primary) hypertension Z89.512 Acquired absence of left leg below knee Follow-up Appointments Return Appointment in 1 week. Bathing/ [name] wounds with antibacterial soap and water. May shower; gently cleanse wound with antibacterial soap, rinse and pat dry prior to dressing wounds No tub bath. Anesthetic (Use 'Patient Medications' Section for Anesthetic Order Entry) Lidocaine applied to wound bed Wound Treatment Wound #2 - T Great oe Wound Laterality: Right Cleanser: Soap and Water 3 x Per Week/30 Days Discharge Instructions: Gently cleanse wound with antibacterial soap, rinse and pat dry prior to  dressing wounds Prim Dressing: Silvercel Small 2x2 (in/in) 3 x Per Week/30 Days ary Discharge  Instructions: Apply Silvercel Small 2x2 (in/in) as instructed Secondary Dressing: Coverlet Latex-Free Fabric Adhesive Dressings 3 x Per Week/30 Days Discharge Instructions: JAYSTON, FOLGAR ([id number]) [id number] Page 4 of 9 Electronic Signature(s) Signed: 04/28/2022 4:12:26 PM By: [name] Signed: 04/28/2022 4:46:02 PM By: Dustin Keeler PA-C Entered By: [name] on 04/28/2022 09:25:57 -------------------------------------------------------------------------------- Problem List Details Patient Name: Date of Service: [name] 04/28/2022 8:15 A M Medical Record Number: [id number] Patient Account Number: [id number] Date of Birth/Sex: Treating RN: 09/14/1960 (62 y.o. [name] Primary Care Provider: Bernardo [name] Other Clinician: Referring Provider: Treating Provider/Extender: [name] Self, Referral Weeks in Treatment: 0 Active Problems ICD-10 Encounter Code Description Active Date MDM Diagnosis E11.621 Type 2 diabetes mellitus with foot ulcer 04/28/2022 No Yes L97.512 Non-pressure chronic ulcer of other part of right foot with fat layer exposed 04/28/2022 No Yes L84 Corns and callosities 04/28/2022 No Yes N18.30 Chronic kidney disease, stage 3 unspecified 04/28/2022 No Yes I10 Essential (primary) hypertension 04/28/2022 No Yes Z89.512 Acquired absence of left leg below knee 04/28/2022 No Yes Inactive Problems Resolved Problems Electronic Signature(s) Signed: 04/30/2022 10:02:24 AM By: Dustin Keeler PA-C Previous Signature: 04/28/2022 9:06:58 AM Version By: Dustin Keeler PA-C Previous Signature: 04/28/2022 9:06:30 AM Version By: Dustin Keeler PA-C Entered By: [name] on 04/30/2022 10:02:24 -------------------------------------------------------------------------------- Progress Note Details Patient Name:  Date of Service: [name] 04/28/2022 8:15 A M Medical Record Number: [id number] Patient Account Number: [id number] Date of Birth/Sex: Treating RN: 09-23-1960 (62 y.o. [name] Primary Care Provider: Bernardo [name] Other Clinician: Referring Provider: Treating Provider/Extender: [name] Self, Referral Weeks in Treatment: 0 Subjective Chief Complaint Information obtained from Patient Right great callus buildup with concern for underlying ulcer [name], [name] ([id number]) [id number] Page 5 of 9 History of Present Illness (HPI) 11-07-2021 upon evaluation today patient appears to be doing somewhat poorly in regard to a wound on his right plantar great toe. Fortunately there does not appear to be any evidence of active infection locally or systemically at this time which is great news. No fevers, chills, nausea, vomiting, or diarrhea. With that being said he tells me that he really has not been noticing a lot of improvement either with regard to the treatment. He has been under the care of [name] up to this point for roughly a year. More recently he did have an MRI which revealed the potential for possible osteomyelitis. With that being said I do believe that he definitely should probably be monitored for this but to be honest the Elesa Hacker that he was given probably did a good job. Considering there was not much being seen on MRI I do not think there is a high likelihood of severe osteomyelitis at this point we will definitely keep an eye on things however. Nonetheless right now has been utilizing Silvadene cream I definitely think we can find something better to help get this moving in the right direction. Patient does have a history of diabetes mellitus type 2, chronic kidney disease stage III, hypertension, and again he has had an amputation of the left leg currently. The right leg is the 1 that we are actually treating the great toe wound  on. He has a below-knee amputation. 11-11-2021 upon evaluation today patient's wound actually is showing signs of excellent improvement. I am actually very pleased even compared to last week this is already better. He is very happy as well. 12-02-2021 upon evaluation today patient appears  to be doing pretty well in regard to his toe ulcer this been several weeks since I saw him part of it was our scheduling with me being out of town and part of it was he actually missed an appointment secondary to his trunk the will actually fell off and he had some issues there getting it fixed on Monday so he missed his appointment last Monday. Nonetheless the good news is in today things appear to be doing well no signs of infection though he does have a pretty good need for sharp debridement to try to clear some of this away. 11/6; right first toes ulcer complicated by his left-sided BKA.Marland KitchenNo major change from last visit 12-16-2021 upon evaluation today patient appears to be doing well currently in regard to his toe ulcer that was not making a good amount of improvement yet I do believe the total contact cast is really good to be necessary. I talked about that before he tells me his wife can drive him around he is done with working now also he can actually look towards doing this. 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 12-30-2021 upon evaluation today patient appears to be doing well currently in regard to his foot ulcer on the toe although he is still building up quite a bit of callus. Will actually get the total contact cast on him today and started which I think is good to be very beneficial. 01-02-2022 upon evaluation today patient appears to be doing much better in regard to his wound. The cast does seem to do excellent for him. Fortunately there does not appear to be any evidence of active  infections at this time. 01-09-2022 upon evaluation today patient appears to be doing excellent in regard to his wound. He is actually showing signs of excellent improvement and in general I think were very close to complete resolution. In fact there might be just a really tiny area, 0.1, still open but again this is much better than where we were prior to put the cast on. Its only been a little over a week since we put the first cast in place. 01-17-2022 upon 11 valuation today patient appears to be doing well currently in regard to his toe ulcer. In fact he appears to be completely healed based on what I am seeing at this point. I do not see any evidence of anything open. Readmission: 04-28-2022 upon evaluation today patient presents for reevaluation here in the clinic I actually saw him last January 17, 2022. At that point he was completely healed. With that being said unfortunately he notes that he has been having some callus buildup in regard to the toe and he figured he should come in and get this checked out sooner rather than later. This is the medial aspect of the right great toe again he has a below-knee amputation on the left. Fortunately I do not see any signs of infection there is a lot of callus that was not definitively open when I first saw this but we did perform debridement to clear this away that we detailed in the objective section. Patient History Information obtained from Patient. Allergies bee venom protein (honey bee), Reglan Social History Former smoker - quit 1980's, Marital Status - Married, Alcohol Use - Never, Drug Use - No History, Caffeine Use - Rarely. Medical History Cardiovascular Patient has  history of Coronary Artery Disease, Hypertension, Peripheral Venous Disease Endocrine Patient has history of Type II Diabetes Neurologic Patient has history of Neuropathy Patient is treated with Insulin, Oral Agents. Blood sugar results noted at the following times:  Breakfast - 114. Review of Systems (ROS) Constitutional Symptoms (General Health) Denies complaints or symptoms of Fatigue, Fever, Chills, Marked Weight Change. Eyes Complains or has symptoms of Glasses / Contacts. Ear/Nose/Mouth/Throat Denies complaints or symptoms of Difficult clearing ears, Sinusitis. Hematologic/Lymphatic Denies complaints or symptoms of Bleeding / Clotting Disorders, Human Immunodeficiency Virus. Respiratory Denies complaints or symptoms of Chronic or frequent coughs, Shortness of Breath. Gastrointestinal Denies complaints or symptoms of Frequent diarrhea, Nausea, Vomiting. Genitourinary chronic kidney disease stage III Immunological Denies complaints or symptoms of Hives, Itching. Integumentary (Skin) Complains or has symptoms of Wounds. Musculoskeletal arthritis KARION, [name] ([id number]) [id number] Page 6 of 9 Psychiatric Denies complaints or symptoms of Anxiety, Claustrophobia. Objective Constitutional patient is hypertensive.. pulse regular and within target range for patient.[name] respirations regular, non-labored and within target range for patient.[name] temperature within target range for patient.. Well-nourished and well-hydrated in no acute distress. Vitals Time Taken: 8:39 AM, Height: 77 in, Source: Stated, Weight: 257 lbs, Source: Stated, BMI: 30.5, Temperature: 97.6 F, Pulse: 57 bpm, Respiratory Rate: 18 breaths/min, Blood Pressure: 155/89 mmHg. Eyes conjunctiva clear no eyelid edema noted. pupils equal round and reactive to light and accommodation. Ears, Nose, Mouth, and Throat no gross abnormality of ear auricles or external auditory canals. normal hearing noted during conversation. mucus membranes moist. Respiratory normal breathing without difficulty. Cardiovascular 2+ dorsalis pedis/posterior tibialis pulses. no clubbing, cyanosis, significant edema, Musculoskeletal Patient unable to walk without  assistance. Psychiatric this patient is able to make decisions and demonstrates good insight into disease process. Alert and Oriented x 3. pleasant and cooperative. General Notes: Upon inspection patient's wound bed actually showed signs of significant callus buildup I was not certain there was a wound underneath that first I did remove the callus carefully however I was able to expose the fact that there was a wound underlying. In the end I remove slough, biofilm, and callus down to good subcutaneous tissue which patient tolerated today without complication and postdebridement this actually looks to be doing much better the wound is not extremely large but nonetheless I am happy to take care of it now and not allow it to get much worse. He again has good arterial flow ABIs previously were excellent. He does have a left below-knee amputation. He uses a prosthesis for ambulation. He does stay very active however. Integumentary (Hair, Skin) Wound #2 status is Open. Original cause of wound was Gradually Appeared. The date acquired was: 04/04/2022. The wound is located on the Right T Great. The oe wound measures 0.4cm length x 0.3cm width x 0.2cm depth; 0.094cm^2 area and 0.019cm^3 volume. There is Fat Layer (Subcutaneous Tissue) exposed. There is no tunneling or undermining noted. There is a medium amount of serosanguineous drainage noted. There is no granulation within the wound bed. There is a large (67-100%) amount of necrotic tissue within the wound bed including Adherent Slough. General Notes: callus build up Assessment Active Problems ICD-10 Type 2 diabetes mellitus with foot ulcer Non-pressure chronic ulcer of other part of right foot with fat layer exposed Corns and callosities Chronic kidney disease, stage 3 unspecified Essential (primary) hypertension Acquired absence of left leg below knee Procedures Wound #2 Pre-procedure diagnosis of Wound #2 is a Diabetic Wound/Ulcer of the Lower  Extremity located on the Right  T Great .Severity of Tissue Pre Debridement is: oe Limited to breakdown of skin. There was a Excisional Skin/Subcutaneous Tissue Debridement with a total area of 0.15 sq cm performed by [name]., PA- C. With the following instrument(s): Curette to remove Viable and Non-Viable tissue/material. Material removed includes Callus, Subcutaneous Tissue, and Slough after achieving pain control using Lidocaine 4% Topical Solution. No specimens were taken. A time out was conducted at 09:10, prior to the start of the procedure. A Moderate amount of bleeding was controlled with Silver Nitrate. The procedure was tolerated well. Post Debridement Measurements: 0.4cm length x 0.3cm width x 0.2cm depth; 0.019cm^3 volume. Character of Wound/Ulcer Post Debridement is stable. Severity of Tissue Post Debridement is: Fat layer exposed. Post procedure Diagnosis Wound #2: Same as Pre-Procedure [name], [name] ([id number]) [id number] Page 7 of 9 Plan Follow-up Appointments: Return Appointment in 1 week. Bathing/ Shower/ Hygiene: Wash wounds with antibacterial soap and water. May shower; gently cleanse wound with antibacterial soap, rinse and pat dry prior to dressing wounds No tub bath. Anesthetic (Use 'Patient Medications' Section for Anesthetic Order Entry): Lidocaine applied to wound bed WOUND #2: - T Great Wound Laterality: Right oe Cleanser: Soap and Water 3 x Per Week/30 Days Discharge Instructions: Gently cleanse wound with antibacterial soap, rinse and pat dry prior to dressing wounds Prim Dressing: Silvercel Small 2x2 (in/in) 3 x Per Week/30 Days ary Discharge Instructions: Apply Silvercel Small 2x2 (in/in) as instructed Secondary Dressing: Coverlet Latex-Free Fabric Adhesive Dressings 3 x Per Week/30 Days Discharge Instructions: Knuckle 1. I am good recommend currently based on what I am seeing that we go ahead and have the patient  continue with the recommendation for wound care measures as discussed in the office today this is good to be silver cell which I think should do a good job. 2. I am also can recommend a coverlet to hold in place. 3 he should continue to wear shoe it is really not can be easy to do and offloading with him we previously done a cast but I am hoping we may be able to get this healed without the cast as that was not easy for him to ambulate with to be honest. If we have to we will do a. He also is working right now but also makes the cast improbability. He does Biomedical scientist. We will see patient back for reevaluation in 1 week here in the clinic. If anything worsens or changes patient will contact our office for additional recommendations. Electronic Signature(s) Signed: 04/30/2022 10:02:35 AM By: Dustin Keeler PA-C Previous Signature: 04/30/2022 10:01:21 AM Version By: Dustin Keeler PA-C Entered By: [name] on 04/30/2022 10:02:35 -------------------------------------------------------------------------------- ROS/PFSH Details Patient Name: Date of Service: [name] 04/28/2022 8:15 A M Medical Record Number: [id number] Patient Account Number: [id number] Date of Birth/Sex: Treating RN: 1961/02/01 (62 y.o. [name] Primary Care Provider: Bernardo [name] Other Clinician: Referring Provider: Treating Provider/Extender: [name] Self, Referral Weeks in Treatment: 0 Information Obtained From Patient Constitutional Symptoms (General Health) Complaints and Symptoms: Negative for: Fatigue; Fever; Chills; Marked Weight Change Eyes Complaints and Symptoms: Positive for: Glasses / Contacts Ear/Nose/Mouth/Throat Complaints and Symptoms: Negative for: Difficult clearing ears; Sinusitis Hematologic/Lymphatic Complaints and Symptoms: Negative for: Bleeding / Clotting Disorders; Human Immunodeficiency Virus Respiratory Complaints and Symptoms: Negative for: Chronic or  frequent coughs; Shortness of Breath Gastrointestinal Complaints and Symptoms: Negative for: Frequent diarrhea; Nausea; Vomiting [name], [name] ([id number]) [id number] Page 8 of 9 Immunological Complaints and  Symptoms: Negative for: Hives; Itching Integumentary (Skin) Complaints and Symptoms: Positive for: Wounds Psychiatric Complaints and Symptoms: Negative for: Anxiety; Claustrophobia Cardiovascular Medical History: Positive for: Coronary Artery Disease; Hypertension; Peripheral Venous Disease Endocrine Medical History: Positive for: Type II Diabetes Time with diabetes: 20 years Treated with: Insulin, Oral agents Blood sugar testing results: Breakfast: 114 Genitourinary Complaints and Symptoms: Review of System Notes: chronic kidney disease stage III Musculoskeletal Complaints and Symptoms: Review of System Notes: arthritis Neurologic Medical History: Positive for: Neuropathy Oncologic Immunizations Pneumococcal Vaccine: Received Pneumococcal Vaccination: No Implantable Devices None Family and Social History Former smoker - quit 1980's; Marital Status - Married; Alcohol Use: Never; Drug Use: No History; Caffeine Use: Rarely Electronic Signature(s) Signed: 04/28/2022 4:12:26 PM By: [name] Signed: 04/28/2022 4:46:02 PM By: Dustin Keeler PA-C Entered By: [name] on 04/28/2022 08:44:07 -------------------------------------------------------------------------------- SuperBill Details Patient Name: Date of Service: [name] 04/28/2022 Medical Record Number: [id number] Patient Account Number: [id number] Date of Birth/Sex: Treating RN: 30-Jul-1960 (62 y.o. [name] Primary Care Provider: Bernardo [name] Other Clinician: Referring Provider: Treating Provider/Extender: [name] Self, Referral [place], [name] ([id number]) [id number] Page 9 of 9 Weeks in Treatment:  0 Diagnosis Coding ICD-10 Codes Code Description E11.621 Type 2 diabetes mellitus with foot ulcer L97.512 Non-pressure chronic ulcer of other part of right foot with fat layer exposed L84 Corns and callosities N18.30 Chronic kidney disease, stage 3 unspecified I10 Essential (primary) hypertension Z89.512 Acquired absence of left leg below knee Facility Procedures : CPT4 Code: [id number] Description: [place] VISIT-LEV 3 EST PT Modifier: Quantity: 1 : CPT4 Code: [id number] Description: [id number] - DEB SUBQ TISSUE 20 SQ CM/< ICD-10 Diagnosis Description L97.512 Non-pressure chronic ulcer of other part of right foot with fat layer exposed Modifier: Quantity: 1 Physician Procedures : CPT4 Code Description Modifier [id number] - WC PHYS LEVEL 4 - EST PT 25 ICD-10 Diagnosis Description E11.621 Type 2 diabetes mellitus with foot ulcer L84 Corns and callosities L97.512 Non-pressure chronic ulcer of other part of right foot with fat  layer exposed N18.30 Chronic kidney disease, stage 3 unspecified Quantity: 1 : [id number] - WC PHYS SUBQ TISS 20 SQ CM ICD-10 Diagnosis Description L97.512 Non-pressure chronic ulcer of other part of right foot with fat layer exposed Quantity: 1 Electronic Signature(s) Signed: 04/30/2022 10:03:00 AM By: Dustin Keeler PA-C Previous Signature: 04/30/2022 10:01:56 AM Version By: Dustin Keeler PA-C Entered By: [name] on 04/30/2022 10:03:00

## 2022-05-05 ENCOUNTER — Encounter: Payer: PPO | Attending: Physician Assistant | Admitting: Physician Assistant

## 2022-05-05 DIAGNOSIS — E1140 Type 2 diabetes mellitus with diabetic neuropathy, unspecified: Secondary | ICD-10-CM | POA: Diagnosis not present

## 2022-05-05 DIAGNOSIS — E11621 Type 2 diabetes mellitus with foot ulcer: Secondary | ICD-10-CM | POA: Insufficient documentation

## 2022-05-05 DIAGNOSIS — Z89512 Acquired absence of left leg below knee: Secondary | ICD-10-CM | POA: Diagnosis not present

## 2022-05-05 DIAGNOSIS — L84 Corns and callosities: Secondary | ICD-10-CM | POA: Diagnosis not present

## 2022-05-05 DIAGNOSIS — N183 Chronic kidney disease, stage 3 unspecified: Secondary | ICD-10-CM | POA: Insufficient documentation

## 2022-05-05 DIAGNOSIS — I129 Hypertensive chronic kidney disease with stage 1 through stage 4 chronic kidney disease, or unspecified chronic kidney disease: Secondary | ICD-10-CM | POA: Diagnosis not present

## 2022-05-05 DIAGNOSIS — L97512 Non-pressure chronic ulcer of other part of right foot with fat layer exposed: Secondary | ICD-10-CM | POA: Insufficient documentation

## 2022-05-05 DIAGNOSIS — E1122 Type 2 diabetes mellitus with diabetic chronic kidney disease: Secondary | ICD-10-CM | POA: Diagnosis not present

## 2022-05-05 NOTE — Progress Notes (Addendum)
[name], [name] ([id number]) [id number] Page 1 of 7 Visit Report for 05/05/2022 Chief Complaint Document Details Patient Name: Date of Service: [name], [name] 05/05/2022 9:45 A M Medical Record Number: [id number] Patient Account Number: [id number] Date of Birth/Sex: Treating RN: [birth year] (62 y.o. [name] Primary Care Provider: Tresa [name] Other Clinician: Referring Provider: Treating Provider/Extender: [name] in Treatment: 1 Information Obtained from: Patient Chief Complaint Right great callus buildup with concern for underlying ulcer Electronic Signature(s) Signed: 05/05/2022 9:54:40 AM By: Dustin Kelp PA-C Entered By: [name] on 05/05/2022 09:54:40 -------------------------------------------------------------------------------- Debridement Details Patient Name: Date of Service: [name]. 05/05/2022 9:45 A M Medical Record Number: [id number] Patient Account Number: [id number] Date of Birth/Sex: Treating RN: February 20, 1960 (62 y.o. [name] Primary Care Provider: Tresa [name] Other Clinician: Referring Provider: Treating Provider/Extender: [name] in Treatment: 1 Debridement Performed for Assessment: Wound #2 Right T Great oe Performed By: Physician [name]., PA-C Debridement Type: Debridement Severity of Tissue Pre Debridement: Limited to breakdown of skin Level of Consciousness (Pre-procedure): Awake and Alert Pre-procedure Verification/Time Out Yes - 10:02 Taken: Pain Control: Lidocaine 4% T opical Solution T Area Debrided ([name] x W): otal 0.3 (cm) x 0.3 (cm) = 0.09 (cm) Tissue and other material debrided: Viable, Non-Viable, Callus, Slough, Subcutaneous, Slough Level: Skin/Subcutaneous Tissue Debridement Description: Excisional Instrument: Curette Bleeding: Minimum Hemostasis Achieved: Pressure Response to Treatment: Procedure was  tolerated well Level of Consciousness (Post- Awake and Alert procedure): Post Debridement Measurements of Total Wound Length: (cm) 0.3 Width: (cm) 0.3 Depth: (cm) 0.1 Volume: (cm) 0.007 Character of Wound/Ulcer Post Debridement: Stable Severity of Tissue Post Debridement: Fat layer exposed Post Procedure Diagnosis Same as Pre-procedure Electronic Signature(s) Signed: 05/05/2022 5:24:57 PM By: [name] Signed: 05/09/2022 8:47:01 AM By: Dustin Kelp PA-C Entered By: [name] on 05/05/2022 10:06:04 [name], [name] ([id number]) [id number] Page 2 of 7 -------------------------------------------------------------------------------- HPI Details Patient Name: Date of Service: [name], [name] 05/05/2022 9:45 A M Medical Record Number: [id number] Patient Account Number: [id number] Date of Birth/Sex: Treating RN: [birth year] (62 y.o. [name] Primary Care Provider: Tresa [name] Other Clinician: Referring Provider: Treating Provider/Extender: [name] in Treatment: 1 History of Present Illness HPI Description: 11-07-2021 upon evaluation today patient appears to be doing somewhat poorly in regard to a wound on his right plantar great toe. Fortunately there does not appear to be any evidence of active infection locally or systemically at this time which is great news. No fevers, chills, nausea, vomiting, or diarrhea. With that being said he tells me that he really has not been noticing a lot of improvement either with regard to the treatment. He has been under the care of Dr. Loreta [name] up to this point for roughly a year. More recently he did have an MRI which revealed the potential for possible osteomyelitis. With that being said I do believe that he definitely should probably be monitored for this but to be honest the [name] that he was given probably did a good job. Considering there was not much being seen on MRI I  do not think there is a high likelihood of severe osteomyelitis at this point we will definitely keep an eye on things however. Nonetheless right now has been utilizing Silvadene cream I definitely think we can find something better to help get this moving in the right direction. Patient does have a history of diabetes mellitus type 2,  chronic kidney disease stage III, hypertension, and again he has had an amputation of the left leg currently. The right leg is the 1 that we are actually treating the great toe wound on. He has a below-knee amputation. 11-11-2021 upon evaluation today patient's wound actually is showing signs of excellent improvement. I am actually very pleased even compared to last week this is already better. He is very happy as well. 12-02-2021 upon evaluation today patient appears to be doing pretty well in regard to his toe ulcer this been several [name] since I saw him part of it was our scheduling with me being out of town and part of it was he actually missed an appointment secondary to his trunk the will actually fell off and he had some issues there getting it fixed on Monday so he missed his appointment last Monday. Nonetheless the good news is in today things appear to be doing well no signs of infection though he does have a pretty good need for sharp debridement to try to clear some of this away. 11/6; right first toes ulcer complicated by his left-sided BKA.Marland KitchenNo major change from last visit 12-16-2021 upon evaluation today patient appears to be doing well currently in regard to his toe ulcer that was not making a good amount of improvement yet I do believe the total contact cast is really good to be necessary. I talked about that before he tells me his wife can drive him around he is done with working now also he can actually look towards doing this. 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 12-30-2021 upon evaluation today patient appears to be doing well currently in regard to his foot ulcer on the toe although he is still building up quite a bit of callus. Will actually get the total contact cast on him today and started which I think is good to be very beneficial. 01-02-2022 upon evaluation today patient appears to be doing much better in regard to his wound. The cast does seem to do excellent for him. Fortunately there does not appear to be any evidence of active infections at this time. 01-09-2022 upon evaluation today patient appears to be doing excellent in regard to his wound. He is actually showing signs of excellent improvement and in general I think were very close to complete resolution. In fact there might be just a really tiny area, 0.1, still open but again this is much better than where we were prior to put the cast on. Its only been a little over a week since we put the first cast in place. 01-17-2022 upon 11 valuation today patient appears to be doing well currently in regard to his toe ulcer. In fact he appears to be completely healed based on what I am seeing at this point. I do not see any evidence of anything open. Readmission: 04-28-2022 upon evaluation today patient presents for reevaluation here in the clinic I actually saw him last January 17, 2022. At that point he was completely healed. With that being said unfortunately he notes that he has been having some callus buildup in regard to the toe and he figured he should come in and get this checked out sooner rather than later. This is the medial aspect of the right great toe again he has a below-knee amputation on the left. Fortunately I do not see  any signs of infection there is a lot of callus that was not definitively open when I first saw this but we did perform debridement to clear this away that we detailed in the objective section. 05-05-2022 upon evaluation today patient  appears to be doing well currently in regard to his wound. I do not think this is getting much worse he still has some callus buildup but he is supposed to be getting his new diabetic shoes shortly which I think is can help a lot with this. Fortunately I do not see any signs of active infection locally or systemically which is great news. No fevers, chills, nausea, vomiting, or diarrhea. Electronic Signature(s) Signed: 05/05/2022 10:10:58 AM By: Dustin Kelp PA-C Entered By: [name] on 05/05/2022 10:10:58 -------------------------------------------------------------------------------- Physical Exam Details Patient Name: Date of Service: [name], [name] 05/05/2022 9:45 A M Medical Record Number: [id number] Patient Account Number: [id number] Date of Birth/Sex: Treating RN: 03/15/1960 (62 y.o. [name] Primary Care Provider: Tresa [name] Other Clinician: Referring Provider: Treating Provider/Extender: [name] in Treatment: 1 Constitutional Well-nourished and well-hydrated in no acute distress. [name], [name] ([id number]) [id number] Page 3 of 7 Respiratory normal breathing without difficulty. Psychiatric this patient is able to make decisions and demonstrates good insight into disease process. Alert and Oriented x 3. pleasant and cooperative. Notes Upon inspection patient's wound bed actually showed signs of the need for sharp debridement clearway some of the callus I did perform this debridement today which she tolerated without complication and postdebridement wound bed is significantly improved. No silver nitrate was needed today. Electronic Signature(s) Signed: 05/05/2022 10:11:13 AM By: Dustin Kelp PA-C Entered By: [name] on 05/05/2022 10:11:13 -------------------------------------------------------------------------------- Physician Orders Details Patient Name: Date of Service: [name] 05/05/2022 9:45 A M Medical Record Number: [id number] Patient Account Number: [id number] Date of Birth/Sex: Treating RN: 07-23-1960 (62 y.o. [name] Primary Care Provider: Tresa [name] Other Clinician: Referring Provider: Treating Provider/Extender: [name] in Treatment: 1 Verbal / Phone Orders: No Diagnosis Coding ICD-10 Coding Code Description E11.621 Type 2 diabetes mellitus with foot ulcer L97.512 Non-pressure chronic ulcer of other part of right foot with fat layer exposed L84 Corns and callosities N18.30 Chronic kidney disease, stage 3 unspecified I10 Essential (primary) hypertension Z89.512 Acquired absence of left leg below knee Follow-up Appointments Return Appointment in 1 week. Bathing/ [employer] wounds with antibacterial soap and water. May shower; gently cleanse wound with antibacterial soap, rinse and pat dry prior to dressing wounds No tub bath. Anesthetic (Use 'Patient Medications' Section for Anesthetic Order Entry) Lidocaine applied to wound bed Wound Treatment Wound #2 - T Great oe Wound Laterality: Right Cleanser: Soap and Water 3 x Per Week/30 Days Discharge Instructions: Gently cleanse wound with antibacterial soap, rinse and pat dry prior to dressing wounds Prim Dressing: Silvercel Small 2x2 (in/in) 3 x Per Week/30 Days ary Discharge Instructions: Apply Silvercel Small 2x2 (in/in) as instructed Secondary Dressing: Coverlet Latex-Free Fabric Adhesive Dressings 3 x Per Week/30 Days Discharge Instructions: Knuckle Electronic Signature(s) Signed: 05/05/2022 5:24:57 PM By: [name] Signed: 05/09/2022 8:47:01 AM By: Dustin Kelp PA-C Entered By: [name] on 05/05/2022 10:09:22 -------------------------------------------------------------------------------- Problem List Details Patient Name: Date of Service: [name]. 05/05/2022 9:45 A Nelma Rothman, Idus [name] ([id number])  [id number] Page 4 of 7 Medical Record Number: [id number] Patient Account Number: [id number] Date of Birth/Sex: Treating RN: May 22, 1960 (62 y.o. Roselle Locus,  Caitlin Primary Care Provider: Tresa [name] Other Clinician: Referring Provider: Treating Provider/Extender: [name] in Treatment: 1 Active Problems ICD-10 Encounter Code Description Active Date MDM Diagnosis E11.621 Type 2 diabetes mellitus with foot ulcer 04/28/2022 No Yes L97.512 Non-pressure chronic ulcer of other part of right foot with fat layer exposed 04/28/2022 No Yes L84 Corns and callosities 04/28/2022 No Yes N18.30 Chronic kidney disease, stage 3 unspecified 04/28/2022 No Yes I10 Essential (primary) hypertension 04/28/2022 No Yes Z89.512 Acquired absence of left leg below knee 04/28/2022 No Yes Inactive Problems Resolved Problems Electronic Signature(s) Signed: 05/05/2022 9:54:31 AM By: Dustin Kelp PA-C Entered By: [name] on 05/05/2022 09:54:31 -------------------------------------------------------------------------------- Progress Note Details Patient Name: Date of Service: [name]. 05/05/2022 9:45 A M Medical Record Number: [id number] Patient Account Number: [id number] Date of Birth/Sex: Treating RN: 29-Dec-1960 (62 y.o. [name] Primary Care Provider: Tresa [name] Other Clinician: Referring Provider: Treating Provider/Extender: [name] in Treatment: 1 Subjective Chief Complaint Information obtained from Patient Right great callus buildup with concern for underlying ulcer History of Present Illness (HPI) 11-07-2021 upon evaluation today patient appears to be doing somewhat poorly in regard to a wound on his right plantar great toe. Fortunately there does not appear to be any evidence of active infection locally or systemically at this time which is great news. No fevers, chills, nausea, vomiting, or  diarrhea. With that being said he tells me that he really has not been noticing a lot of improvement either with regard to the treatment. He has been under the care of Dr. Loreta [name] up to this point for roughly a year. More recently he did have an MRI which revealed the potential for possible osteomyelitis. With that being said I do believe that he definitely should probably be monitored for this but to be honest the [name] that he was given probably did a good job. Considering there was not much being seen on MRI I do not think there is a high likelihood of severe osteomyelitis at this point we will definitely keep an eye on things however. Nonetheless right now has been utilizing Silvadene cream I definitely think we can find something better to help get this moving in the right direction. Patient does have a history of diabetes mellitus type 2, chronic kidney disease stage III, hypertension, and again he has had an amputation of the left leg currently. The right leg is the 1 that we are actually treating the great toe wound on. He has a below-knee amputation. 11-11-2021 upon evaluation today patient's wound actually is showing signs of excellent improvement. I am actually very pleased even compared to last week [name], [name] ([id number]) [phone].pdf Page 5 of 7 this is already better. He is very happy as well. 12-02-2021 upon evaluation today patient appears to be doing pretty well in regard to his toe ulcer this been several [name] since I saw him part of it was our scheduling with me being out of town and part of it was he actually missed an appointment secondary to his trunk the will actually fell off and he had some issues there getting it fixed on Monday so he missed his appointment last Monday. Nonetheless the good news is in today things appear to be doing well no signs of infection though he does have a pretty good need for sharp debridement to try to clear some of  this away. 11/6; right first toes ulcer complicated by his left-sided BKA.Marland KitchenNo  major change from last visit 12-16-2021 upon evaluation today patient appears to be doing well currently in regard to his toe ulcer that was not making a good amount of improvement yet I do believe the total contact cast is really good to be necessary. I talked about that before he tells me his wife can drive him around he is done with working now also he can actually look towards doing this. 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 12-30-2021 upon evaluation today patient appears to be doing well currently in regard to his foot ulcer on the toe although he is still building up quite a bit of callus. Will actually get the total contact cast on him today and started which I think is good to be very beneficial. 01-02-2022 upon evaluation today patient appears to be doing much better in regard to his wound. The cast does seem to do excellent for him. Fortunately there does not appear to be any evidence of active infections at this time. 01-09-2022 upon evaluation today patient appears to be doing excellent in regard to his wound. He is actually showing signs of excellent improvement and in general I think were very close to complete resolution. In fact there might be just a really tiny area, 0.1, still open but again this is much better than where we were prior to put the cast on. Its only been a little over a week since we put the first cast in place. 01-17-2022 upon 11 valuation today patient appears to be doing well currently in regard to his toe ulcer. In fact he appears to be completely healed based on what I am seeing at this point. I do not see any evidence of anything open. Readmission: 04-28-2022 upon evaluation today patient presents for reevaluation here in the clinic I actually saw him last January 17, 2022. At that point he was completely healed. With that being said unfortunately he notes that he has been having some callus buildup in regard to the toe and he figured he should come in and get this checked out sooner rather than later. This is the medial aspect of the right great toe again he has a below-knee amputation on the left. Fortunately I do not see any signs of infection there is a lot of callus that was not definitively open when I first saw this but we did perform debridement to clear this away that we detailed in the objective section. 05-05-2022 upon evaluation today patient appears to be doing well currently in regard to his wound. I do not think this is getting much worse he still has some callus buildup but he is supposed to be getting his new diabetic shoes shortly which I think is can help a lot with this. Fortunately I do not see any signs of active infection locally or systemically which is great news. No fevers, chills, nausea, vomiting, or diarrhea. Objective Constitutional Well-nourished and well-hydrated in no acute distress. Vitals Time Taken: 9:52 AM, Height: 77 in, Weight: 257 lbs, BMI: 30.5, Temperature: 97.6 F, Pulse: 73 bpm, Respiratory Rate: 18 breaths/min, Blood Pressure: 158/79 mmHg. Respiratory normal breathing without difficulty. Psychiatric this patient is able to make decisions and demonstrates good insight into disease process. Alert and Oriented x 3. pleasant and cooperative. General Notes: Upon inspection patient's wound bed actually showed signs of the need for sharp  debridement clearway some of the callus I did perform this debridement today which she tolerated without complication and postdebridement wound bed is significantly improved. No silver nitrate was needed today. Integumentary (Hair, Skin) Wound #2 status is Open. Original cause of wound was Gradually Appeared. The date acquired was: 04/04/2022. The wound has been in treatment 1 [name].  The wound is located on the Right T Great. The wound measures 0.3cm length x 0.3cm width x 0.1cm depth; 0.071cm^2 area and 0.007cm^3 volume. There is no oe tunneling or undermining noted. There is a medium amount of serosanguineous drainage noted. There is small (1-33%) red, pink granulation within the wound bed. There is a large (67-100%) amount of necrotic tissue within the wound bed including Adherent Slough. Assessment Active Problems ICD-10 Type 2 diabetes mellitus with foot ulcer Non-pressure chronic ulcer of other part of right foot with fat layer exposed Corns and callosities Chronic kidney disease, stage 3 unspecified Essential (primary) hypertension Acquired absence of left leg below knee [name], [name] ([id number]) [id number] Page 6 of 7 Procedures Wound #2 Pre-procedure diagnosis of Wound #2 is a Diabetic Wound/Ulcer of the Lower Extremity located on the Right T Great .Severity of Tissue Pre Debridement is: oe Limited to breakdown of skin. There was a Excisional Skin/Subcutaneous Tissue Debridement with a total area of 0.09 sq cm performed by [name]., PA- C. With the following instrument(s): Curette to remove Viable and Non-Viable tissue/material. Material removed includes Callus, Subcutaneous Tissue, and Slough after achieving pain control using Lidocaine 4% Topical Solution. No specimens were taken. A time out was conducted at 10:02, prior to the start of the procedure. A Minimum amount of bleeding was controlled with Pressure. The procedure was tolerated well. Post Debridement Measurements: 0.3cm length x 0.3cm width x 0.1cm depth; 0.007cm^3 volume. Character of Wound/Ulcer Post Debridement is stable. Severity of Tissue Post Debridement is: Fat layer exposed. Post procedure Diagnosis Wound #2: Same as Pre-Procedure Plan Follow-up Appointments: Return Appointment in 1 week. Bathing/ Shower/ Hygiene: Wash wounds with antibacterial soap  and water. May shower; gently cleanse wound with antibacterial soap, rinse and pat dry prior to dressing wounds No tub bath. Anesthetic (Use 'Patient Medications' Section for Anesthetic Order Entry): Lidocaine applied to wound bed WOUND #2: - T Great Wound Laterality: Right oe Cleanser: Soap and Water 3 x Per Week/30 Days Discharge Instructions: Gently cleanse wound with antibacterial soap, rinse and pat dry prior to dressing wounds Prim Dressing: Silvercel Small 2x2 (in/in) 3 x Per Week/30 Days ary Discharge Instructions: Apply Silvercel Small 2x2 (in/in) as instructed Secondary Dressing: Coverlet Latex-Free Fabric Adhesive Dressings 3 x Per Week/30 Days Discharge Instructions: Knuckle 1. I would recommend that the patient should continue with the wound care measures as before. We have been using the silver alginate dressing which I think is doing a very good job. 2. Also can recommend the Band-Aid to cover. 3. He will be getting his diabetic shoes on Wednesday just 2 days away which is good to be great news as well I think for him. We will see patient back for reevaluation in 1 week here in the clinic. If anything worsens or changes patient will contact our office for additional recommendations. Electronic Signature(s) Signed: 05/05/2022 10:11:35 AM By: Dustin Kelp PA-C Entered By: [name] on 05/05/2022 10:11:35 -------------------------------------------------------------------------------- SuperBill Details Patient Name: Date of Service: [name] 05/05/2022 Medical Record Number: [id number] Patient Account Number: [id number] Date of Birth/Sex: Treating RN: 04/08/1960 (63 y.o. Roselle Locus,  Caitlin Primary Care Provider: Tresa [name] Other Clinician: Referring Provider: Treating Provider/Extender: [name] in Treatment: 1 Diagnosis Coding ICD-10 Codes Code Description E11.621 Type 2 diabetes mellitus with foot ulcer L97.512  Non-pressure chronic ulcer of other part of right foot with fat layer exposed L84 Corns and callosities N18.30 Chronic kidney disease, stage 3 unspecified I10 Essential (primary) hypertension Z89.512 Acquired absence of left leg below knee Facility Procedures : [facility] CPT4 CodeArnie, [place] ([id number] 11 I Description: ) [phone] 042 - DEB SUBQ TISSUE 20 SQ CM/< CD-10 Diagnosis Description L97.512 Non-pressure chronic ulcer of other part of right foot with fat layer exposed Modifier: [id number] Pa 1 Quantity: ge 7 of 7 Physician Procedures : CPT4 Code Description Modifier [id number] 11042 - WC PHYS SUBQ TISS 20 SQ CM ICD-10 Diagnosis Description L97.512 Non-pressure chronic ulcer of other part of right foot with fat layer exposed Quantity: 1 Electronic Signature(s) Signed: 05/05/2022 10:11:51 AM By: Dustin Kelp PA-C Entered By: [name] on 05/05/2022 10:11:51

## 2022-05-06 NOTE — Progress Notes (Signed)
TULLIO, OLDMAN ([id number]) [id number] Page 1 of 7 Visit Report for 05/05/2022 Arrival Information Details Patient Name: Date of Service: EDDER, THOMLINSON 05/05/2022 9:45 A M Medical Record Number: [id number] Patient Account Number: [id number] Date of Birth/Sex: Treating RN: [birth year] (62 y.o. [name] Primary Care Callum Wolf: Bernardo Heater Other Clinician: Referring Tarren Sabree: Treating Jhovany Weidinger/Extender: [name]: 1 Visit Information History Since Last Visit Added or deleted any medications: No Patient Arrived: Ambulatory Any new allergies or adverse reactions: No Arrival Time: 09:51 Had a fall or experienced change in No Accompanied By: self activities of daily living that may affect Transfer Assistance: None risk of falls: Patient Identification Verified: Yes Hospitalized since last visit: No Secondary Verification Process Completed: Yes Has Dressing in Place as Prescribed: Yes Patient Has Alerts: Yes Pain Present Now: No Patient Alerts: type 2 diabetic ABI 09/05/21 R 1.26 ABI 09/05/21 L 1.10 Electronic Signature(s) Signed: 05/05/2022 5:24:57 PM By: Levora Dredge Entered By: Levora Dredge on 05/05/2022 09:52:07 -------------------------------------------------------------------------------- Clinic Level of Care Assessment Details Patient Name: Date of Service: KRYSTOFER, CONNON 05/05/2022 9:45 A M Medical Record Number: [id number] Patient Account Number: [id number] Date of Birth/Sex: Treating RN: 06-11-1960 (62 y.o. [name] Primary Care Andrus Sharp: Bernardo Heater Other Clinician: Referring Treylen Gibbs: Treating Aleaha Fickling/Extender: [name]: 1 Clinic Level of Care Assessment Items TOOL 1 Quantity Score []  - 0 Use when EandM and Procedure is performed on INITIAL visit ASSESSMENTS - Nursing Assessment / Reassessment []  - 0 General Physical Exam  (combine w/ comprehensive assessment (listed just below) when performed on new pt. evals) []  - 0 Comprehensive Assessment (HX, ROS, Risk Assessments, Wounds Hx, etc.) ASSESSMENTS - Wound and Skin Assessment / Reassessment []  - 0 Dermatologic / Skin Assessment (not related to wound area) ASSESSMENTS - Ostomy and/or Continence Assessment and Care []  - 0 Incontinence Assessment and Management []  - 0 Ostomy Care Assessment and Management (repouching, etc.) PROCESS - Coordination of Care []  - 0 Simple Patient / Family Education for ongoing care []  - 0 Complex (extensive) Patient / Family Education for ongoing care []  - 0 Staff obtains Programmer, systems, Records, T Results / Process Orders est []  - 0 Staff telephones HHA, Nursing Homes / Clarify orders / etc []  - 0 Routine Transfer to another Facility (non-emergent condition) []  - 0 Routine Hospital Admission (non-emergent condition) []  - 0 New Admissions / Biomedical engineer / Ordering NPWT Apligraf, etc. , []  - 0 Emergency Hospital Admission (emergent condition) [place] ([id number] Page 2 of 7 []  - 0 Pediatric / Minor Patient Management []  - 0 Isolation Patient Management []  - 0 Hearing / Language / Visual special needs []  - 0 Assessment of Community assistance (transportation, D/C planning, etc.) []  - 0 Additional assistance / Altered mentation []  - 0 Support Surface(s) Assessment (bed, cushion, seat, etc.) INTERVENTIONS - Miscellaneous []  - 0 External ear exam []  - 0 Patient Transfer (multiple staff / Civil Service fast streamer / Similar devices) []  - 0 Simple Staple / Suture removal (25 or less) []  - 0 Complex Staple / Suture removal (26 or more) []  - 0 Hypo/Hyperglycemic Management (do not check if billed separately) []  - 0 Ankle / Brachial Index (ABI) - do not check if billed separately Has the patient been seen at the hospital within the last three years:  Yes Total Score: 0 Level Of Care: ____ Electronic Signature(s) Signed: 05/05/2022 5:24:57 PM By: Levora Dredge Entered  By: Levora Dredge on 05/05/2022 10:09:28 -------------------------------------------------------------------------------- Encounter Discharge Information Details Patient Name: Date of Service: GURVIR, THANE 05/05/2022 9:45 A M Medical Record Number: [id number] Patient Account Number: [id number] Date of Birth/Sex: Treating RN: Oct 09, 1960 (62 y.o. [name] Primary Care Tyrelle Raczka: Bernardo Heater Other Clinician: Referring Dania Marsan: Treating Juwan Vences/Extender: [name]: 1 Encounter Discharge Information Items Post Procedure Vitals Discharge Condition: Stable Temperature (F): 97.6 Ambulatory Status: Ambulatory Pulse (bpm): 73 Discharge Destination: Home Respiratory Rate (breaths/min): 18 Transportation: Private Auto Blood Pressure (mmHg): 158/79 Accompanied By: self Schedule Follow-up Appointment: Yes Clinical Summary of Care: Electronic Signature(s) Signed: 05/05/2022 5:24:57 PM By: Levora Dredge Entered By: Levora Dredge on 05/05/2022 10:11:41 -------------------------------------------------------------------------------- Lower Extremity Assessment Details Patient Name: Date of Service: MARTELLE, HEITZMAN 05/05/2022 9:45 A M Medical Record Number: [id number] Patient Account Number: [id number] Date of Birth/Sex: Treating RN: [birth year] (62 y.o. [name] Primary Care Breion Novacek: Bernardo Heater Other Clinician: Referring Calynn Ferrero: Treating Ouida Abeyta/Extender: [name]: 1 Edema Assessment Assessed: [Left: No] [Right: No] [Left: Edema] [Right: :] Calf Grafton, Jakwan L ([id number] Page 3 of 7 Left: Right: Point of Measurement: 40 cm From Medial Instep 37 cm Ankle Left: Right: Point of Measurement: 13 cm From Medial  Instep 22.3 cm Vascular Assessment Pulses: Dorsalis Pedis Palpable: [Right:Yes] Posterior Tibial Palpable: [Right:Yes] Electronic Signature(s) Signed: 05/05/2022 5:24:57 PM By: Levora Dredge Entered By: Levora Dredge on 05/05/2022 09:58:28 -------------------------------------------------------------------------------- Multi Wound Chart Details Patient Name: Date of Service: [name] 05/05/2022 9:45 A M Medical Record Number: [id number] Patient Account Number: [id number] Date of Birth/Sex: Treating RN: 02-10-1960 (62 y.o. [name] Primary Care Jolyn Deshmukh: Bernardo Heater Other Clinician: Referring Rithwik Schmieg: Treating Aravind Chrismer/Extender: [name]: 1 Vital Signs Height(in): 77 Pulse(bpm): 73 Weight(lbs): [id number] Blood Pressure(mmHg): 158/79 Body Mass Index(BMI): 30.5 Temperature(F): 97.6 Respiratory Rate(breaths/min): 18 [2:Photos:] [N/A:N/A] Right T Great oe N/A N/A Wound Location: Gradually Appeared N/A N/A Wounding Event: Diabetic Wound/Ulcer of the Lower N/A N/A Primary Etiology: Extremity Coronary Artery Disease, N/A N/A Comorbid History: Hypertension, Peripheral Venous Disease, Type II Diabetes, Neuropathy 04/04/2022 N/A N/A Date Acquired: 1 N/A N/A Weeks of Treatment: Open N/A N/A Wound Status: No N/A N/A Wound Recurrence: 0.3x0.3x0.1 N/A N/A Measurements L x W x D (cm) 0.071 N/A N/A A (cm) : rea 0.007 N/A N/A Volume (cm) : 24.50% N/A N/A % Reduction in A rea: 63.20% N/A N/A % Reduction in Volume: Grade 1 N/A N/A Classification: Medium N/A N/A Exudate A mount: Serosanguineous N/A N/A Exudate Type: red, brown N/A N/A Exudate Color: Small (1-33%) N/A N/A Granulation A mount: Red, Pink N/A N/A Granulation Quality: Large (67-100%) N/A N/A Necrotic A mount: Eschar, Adherent Slough N/A N/A Necrotic Tissue: Fat Layer (Subcutaneous Tissue): Yes N/A N/A Exposed Structures: None N/A  N/A Epithelialization: [place], Jaymin L ([id number] Page 4 of 7 Treatment Notes Electronic Signature(s) Signed: 05/05/2022 5:24:57 PM By: Levora Dredge Entered By: Levora Dredge on 05/05/2022 10:02:15 -------------------------------------------------------------------------------- Multi-Disciplinary Care Plan Details Patient Name: Date of Service: [name] 05/05/2022 9:45 A M Medical Record Number: [id number] Patient Account Number: [id number] Date of Birth/Sex: Treating RN: December 17, 1960 (62 y.o. [name] Primary Care Dahmir Epperly: Bernardo Heater Other Clinician: Referring Jelina Paulsen: Treating Lineth Thielke/Extender: [name]: 1 Active Inactive Wound/Skin Impairment Nursing Diagnoses: Impaired tissue integrity Knowledge deficit related to ulceration/compromised skin integrity Goals: Ulcer/skin breakdown will have a volume reduction of 30%  by week 4 Date Initiated: 04/28/2022 Target Resolution Date: 05/26/2022 Goal Status: Active Ulcer/skin breakdown will have a volume reduction of 50% by week 8 Date Initiated: 04/28/2022 Target Resolution Date: 06/23/2022 Goal Status: Active Ulcer/skin breakdown will have a volume reduction of 80% by week 12 Date Initiated: 04/28/2022 Target Resolution Date: 07/21/2022 Goal Status: Active Ulcer/skin breakdown will heal within 14 weeks Date Initiated: 04/28/2022 Target Resolution Date: 08/04/2022 Goal Status: Active Interventions: Assess patient/caregiver ability to obtain necessary supplies Assess patient/caregiver ability to perform ulcer/skin care regimen upon admission and as needed Assess ulceration(s) every visit Provide education on ulcer and skin care Treatment Activities: Skin care regimen initiated : 04/28/2022 Notes: Electronic Signature(s) Signed: 05/05/2022 5:24:57 PM By: Levora Dredge Entered By: Levora Dredge on 05/05/2022  10:10:38 -------------------------------------------------------------------------------- Pain Assessment Details Patient Name: Date of Service: [name] 05/05/2022 9:45 A M Medical Record Number: [id number] Patient Account Number: [id number] Date of Birth/Sex: Treating RN: 1960/02/05 (62 y.o. [name] Primary Care Norvell Caswell: Bernardo Heater Other Clinician: Referring Kaspian Muccio: Treating Briget Shaheed/Extender: [name]: 1 Active Problems Location of Pain Severity and Description of Pain Pha, Tracie L ([id number]) [phone].pdf Page 5 of 7 Patient Has Paino No Site Locations Rate the pain. Current Pain Level: 0 Pain Management and Medication Current Pain Management: Electronic Signature(s) Signed: 05/05/2022 5:24:57 PM By: Levora Dredge Entered By: Levora Dredge on 05/05/2022 09:52:31 -------------------------------------------------------------------------------- Patient/Caregiver Education Details Patient Name: Date of Service: [name] 4/1/2024andnbsp9:45 [place] Record Number: [id number] Patient Account Number: [id number] Date of Birth/Gender: Treating RN: Feb 02, 1961 (62 y.o. [name] Primary Care Physician: Bernardo Heater Other Clinician: Referring Physician: Treating Physician/Extender: Massie Kluver in Treatment: 1 Education Assessment Education Provided To: Patient Education Topics Provided Wound Debridement: Handouts: Wound Debridement Methods: Explain/Verbal Responses: State content correctly Wound/Skin Impairment: Handouts: Caring for Your Ulcer Methods: Explain/Verbal Responses: State content correctly Electronic Signature(s) Signed: 05/05/2022 5:24:57 PM By: Levora Dredge Entered By: Levora Dredge on 05/05/2022 10:11:04 -------------------------------------------------------------------------------- Wound Assessment  Details Patient Name: Date of Service: [name] 05/05/2022 9:45 A M Medical Record Number: [id number] Patient Account Number: [id number] Date of Birth/Sex: Treating RN: Aug 19, 1960 (62 y.o. [name] Primary Care Zeriah Baysinger: Bernardo Heater Other Clinician: Referring Jahleah Mariscal: Treating Samira Acero/Extender: [name]: [place], Reynolds Bowl ([id number]) [id number] Page 6 of 7 Wound Status Wound Number: 2 Primary Diabetic Wound/Ulcer of the Lower Extremity Etiology: Wound Location: Right T Great oe Wound Open Wounding Event: Gradually Appeared Status: Date Acquired: 04/04/2022 Comorbid Coronary Artery Disease, Hypertension, Peripheral Venous Weeks Of Treatment: 1 History: Disease, Type II Diabetes, Neuropathy Clustered Wound: No Photos Wound Measurements Length: (cm) 0.3 Width: (cm) 0.3 Depth: (cm) 0.1 Area: (cm) 0.071 Volume: (cm) 0.007 % Reduction in Area: 24.5% % Reduction in Volume: 63.2% Epithelialization: None Tunneling: No Undermining: No Wound Description Classification: Grade 1 Exudate Amount: Medium Exudate Type: Serosanguineous Exudate Color: red, brown Foul Odor After Cleansing: No Slough/Fibrino Yes Wound Bed Granulation Amount: Small (1-33%) Exposed Structure Granulation Quality: Red, Pink Fat Layer (Subcutaneous Tissue) Exposed: No Necrotic Amount: Large (67-100%) Necrotic Quality: Adherent Slough Treatment Notes Wound #2 (Toe Great) Wound Laterality: Right Cleanser Soap and Water Discharge Instruction: Gently cleanse wound with antibacterial soap, rinse and pat dry prior to dressing wounds Peri-Wound Care Topical Primary Dressing Silvercel Small 2x2 (in/in) Discharge Instruction: Apply Silvercel Small 2x2 (in/in) as instructed Secondary Dressing Coverlet Latex-Free Fabric Adhesive Dressings Discharge Instruction: Knuckle Secured With  Compression Wrap Compression  Stockings Add-Ons Electronic Signature(s) Signed: 05/05/2022 5:24:57 PM By: Levora Dredge Entered By: Levora Dredge on 05/05/2022 10:03:39 Fait, Reynolds Bowl ([id number] Page 7 of 7 -------------------------------------------------------------------------------- Vitals Details Patient Name: Date of Service: TU, BRITTING 05/05/2022 9:45 A M Medical Record Number: [id number] Patient Account Number: [id number] Date of Birth/Sex: Treating RN: 1960-08-31 (62 y.o. [name] Primary Care Tamim Skog: Bernardo Heater Other Clinician: Referring Kierstan Auer: Treating Maanya Hippert/Extender: [name]: 1 Vital Signs Time Taken: 09:52 Temperature (F): 97.6 Height (in): 77 Pulse (bpm): 73 Weight (lbs): 257 Respiratory Rate (breaths/min): 18 Body Mass Index (BMI): 30.5 Blood Pressure (mmHg): 158/79 Reference Range: 80 - 120 mg / dl Electronic Signature(s) Signed: 05/05/2022 5:24:57 PM By: Levora Dredge Entered By: Levora Dredge on 05/05/2022 09:52:25

## 2022-05-07 ENCOUNTER — Ambulatory Visit (INDEPENDENT_AMBULATORY_CARE_PROVIDER_SITE_OTHER): Payer: HMO

## 2022-05-07 DIAGNOSIS — Z89512 Acquired absence of left leg below knee: Secondary | ICD-10-CM

## 2022-05-07 DIAGNOSIS — E1159 Type 2 diabetes mellitus with other circulatory complications: Secondary | ICD-10-CM

## 2022-05-07 DIAGNOSIS — M2041 Other hammer toe(s) (acquired), right foot: Secondary | ICD-10-CM

## 2022-05-07 NOTE — Progress Notes (Signed)
Patient presents today to pick up diabetic shoes and insoles.  Patient was dispensed 1 pair of diabetic shoes and 3 pairs of foam casted diabetic insoles.   He tried on the shoes with the insoles and the fit was satisfactory.   Will follow up next year for new order.    

## 2022-05-12 ENCOUNTER — Encounter: Payer: PPO | Admitting: Physician Assistant

## 2022-05-12 DIAGNOSIS — L97516 Non-pressure chronic ulcer of other part of right foot with bone involvement without evidence of necrosis: Secondary | ICD-10-CM | POA: Diagnosis not present

## 2022-05-12 DIAGNOSIS — E11621 Type 2 diabetes mellitus with foot ulcer: Secondary | ICD-10-CM | POA: Diagnosis not present

## 2022-05-12 NOTE — Progress Notes (Signed)
Dustin, [name] ([id number]) [id number] Page 1 of 7 Visit Report for 05/12/2022 Arrival Information Details Patient Name: Date of Service: [name], [name] 05/12/2022 12:00 PM Medical Record Number: [id number] Patient Account Number: [id number] Date of Birth/Sex: Treating RN: Oct 27, 1960 (62 y.o. [name] Primary Care Yaremi Stahlman: Tresa Res Other Clinician: Referring Eliab Closson: Treating Chicquita Mendel/Extender: Gary Fleet Weeks in Treatment: 2 Visit Information History Since Last Visit Added or deleted any medications: No Patient Arrived: Ambulatory Any new allergies or adverse reactions: No Arrival Time: 11:42 Had a fall or experienced change in No Accompanied By: self activities of daily living that may affect Transfer Assistance: None risk of falls: Patient Identification Verified: Yes Hospitalized since last visit: No Secondary Verification Process Completed: Yes Has Dressing in Place as Prescribed: Yes Patient Has Alerts: Yes Pain Present Now: No Patient Alerts: type 2 diabetic ABI 09/05/21 R 1.26 ABI 09/05/21 [name] 1.10 Electronic Signature(s) Signed: 05/12/2022 4:43:19 PM By: Angelina Pih Entered By: Angelina Pih on 05/12/2022 11:43:14 -------------------------------------------------------------------------------- Clinic Level of Care Assessment Details Patient Name: Date of Service: [name], [name] 05/12/2022 12:00 PM Medical Record Number: [id number] Patient Account Number: [id number] Date of Birth/Sex: Treating RN: [birth year] (62 y.o. [name] Primary Care Sedona Wenk: Tresa Res Other Clinician: Referring Marjory Meints: Treating Julias Mould/Extender: Gary Fleet Weeks in Treatment: 2 Clinic Level of Care Assessment Items TOOL 1 Quantity Score []  - 0 Use when EandM and Procedure is performed on INITIAL visit ASSESSMENTS - Nursing Assessment / Reassessment []  - 0 General Physical Exam  (combine w/ comprehensive assessment (listed just below) when performed on new pt. evals) []  - 0 Comprehensive Assessment (HX, ROS, Risk Assessments, Wounds Hx, etc.) ASSESSMENTS - Wound and Skin Assessment / Reassessment []  - 0 Dermatologic / Skin Assessment (not related to wound area) ASSESSMENTS - Ostomy and/or Continence Assessment and Care []  - 0 Incontinence Assessment and Management []  - 0 Ostomy Care Assessment and Management (repouching, etc.) PROCESS - Coordination of Care []  - 0 Simple Patient / Family Education for ongoing care []  - 0 Complex (extensive) Patient / Family Education for ongoing care []  - 0 Staff obtains Chiropractor, Records, T Results / Process Orders est []  - 0 Staff telephones HHA, Nursing Homes / Clarify orders / etc []  - 0 Routine Transfer to another Facility (non-emergent condition) []  - 0 Routine Hospital Admission (non-emergent condition) []  - 0 New Admissions / Manufacturing engineer / Ordering NPWT Apligraf, etc. , []  - 0 Emergency Hospital Admission (emergent condition) PROCESS - Special Needs [name] ([id number]) [phone].pdf Page 2 of 7 []  - 0 Pediatric / Minor Patient Management []  - 0 Isolation Patient Management []  - 0 Hearing / Language / Visual special needs []  - 0 Assessment of Community assistance (transportation, D/C planning, etc.) []  - 0 Additional assistance / Altered mentation []  - 0 Support Surface(s) Assessment (bed, cushion, seat, etc.) INTERVENTIONS - Miscellaneous []  - 0 External ear exam []  - 0 Patient Transfer (multiple staff / Nurse, adult / Similar devices) []  - 0 Simple Staple / Suture removal (25 or less) []  - 0 Complex Staple / Suture removal (26 or more) []  - 0 Hypo/Hyperglycemic Management (do not check if billed separately) []  - 0 Ankle / Brachial Index (ABI) - do not check if billed separately Has the patient been seen at the hospital within the last three years:  Yes Total Score: 0 Level Of Care: ____ Electronic Signature(s) Signed: 05/12/2022 4:43:19 PM By: Angelina Pih Entered By: Roger Shelter,  Caitlin on 05/12/2022 12:49:43 -------------------------------------------------------------------------------- Encounter Discharge Information Details Patient Name: Date of Service: [name], [name] 05/12/2022 12:00 PM Medical Record Number: [id number] Patient Account Number: [id number] Date of Birth/Sex: Treating RN: 05-02-1960 (62 y.o. [name] Primary Care Jaryn Hocutt: Tresa Res Other Clinician: Referring Feather Berrie: Treating Jozlynn Plaia/Extender: Gary Fleet Weeks in Treatment: 2 Encounter Discharge Information Items Post Procedure Vitals Discharge Condition: Stable Temperature (F): 97.9 Ambulatory Status: Ambulatory Pulse (bpm): 69 Discharge Destination: Home Respiratory Rate (breaths/min): 18 Transportation: Private Auto Blood Pressure (mmHg): 139/77 Accompanied By: self Schedule Follow-up Appointment: Yes Clinical Summary of Care: Electronic Signature(s) Signed: 05/12/2022 12:51:28 PM By: Angelina Pih Entered By: Angelina Pih on 05/12/2022 12:51:28 -------------------------------------------------------------------------------- Lower Extremity Assessment Details Patient Name: Date of Service: [name], [name] 05/12/2022 12:00 PM Medical Record Number: [id number] Patient Account Number: [id number] Date of Birth/Sex: Treating RN: 06-09-1960 (62 y.o. [name] Primary Care Nocholas Damaso: Tresa Res Other Clinician: Referring Arnecia Ector: Treating Kylynn Street/Extender: Gary Fleet Weeks in Treatment: 2 Vascular Assessment Pulses: Dorsalis Pedis Palpable: [Right:Yes] [name], Tyner [name] ([id number]) [Right:[id number] Page 3 of 7] Electronic Signature(s) Signed: 05/12/2022 4:43:19 PM By: Angelina Pih Entered By: Angelina Pih on 05/12/2022  11:50:05 -------------------------------------------------------------------------------- Multi Wound Chart Details Patient Name: Date of Service: [name]. 05/12/2022 12:00 PM Medical Record Number: [id number] Patient Account Number: [id number] Date of Birth/Sex: Treating RN: 1960/02/20 (62 y.o. [name] Primary Care Jadriel Saxer: Tresa Res Other Clinician: Referring Meron Bocchino: Treating Shantaya Bluestone/Extender: Gary Fleet Weeks in Treatment: 2 Vital Signs Height(in): 77 Pulse(bpm): 69 Weight(lbs): 257 Blood Pressure(mmHg): 139/77 Body Mass Index(BMI): 30.5 Temperature(F): 97.9 Respiratory Rate(breaths/min): 18 [2:Photos:] [N/A:N/A] Right T Great oe N/A N/A Wound Location: Gradually Appeared N/A N/A Wounding Event: Diabetic Wound/Ulcer of the Lower N/A N/A Primary Etiology: Extremity Coronary Artery Disease, N/A N/A Comorbid History: Hypertension, Peripheral Venous Disease, Type II Diabetes, Neuropathy 04/04/2022 N/A N/A Date Acquired: 2 N/A N/A Weeks of Treatment: Open N/A N/A Wound Status: No N/A N/A Wound Recurrence: 0.2x0.2x0.1 N/A N/A Measurements [name] x W x D (cm) 0.031 N/A N/A A (cm) : rea 0.003 N/A N/A Volume (cm) : 67.00% N/A N/A % Reduction in A rea: 84.20% N/A N/A % Reduction in Volume: Grade 1 N/A N/A Classification: Medium N/A N/A Exudate A mount: Serosanguineous N/A N/A Exudate Type: red, brown N/A N/A Exudate Color: Large (67-100%) N/A N/A Granulation A mount: Red, Pink N/A N/A Granulation Quality: Small (1-33%) N/A N/A Necrotic A mount: Fat Layer (Subcutaneous Tissue): Yes N/A N/A Exposed Structures: None N/A N/A Epithelialization: Debridement - Excisional N/A N/A Debridement: Pre-procedure Verification/Time Out 12:01 N/A N/A Taken: Lidocaine 4% Topical Solution N/A N/A Pain Control: Callus, Subcutaneous, Slough N/A N/A Tissue Debrided: Skin/Subcutaneous Tissue N/A N/A Level: 0.04 N/A  N/A Debridement A (sq cm): rea Curette N/A N/A Instrument: Minimum N/A N/A Bleeding: Pressure N/A N/A Hemostasis A chieved: Procedure was tolerated well N/A N/A Debridement Treatment Response: 0.2x0.2x0.1 N/A N/A Post Debridement Measurements [name] x W x D (cm) 0.003 N/A N/A Post Debridement Volume: (cm) Debridement N/A N/A Procedures Performed: Treatment Notes Fangman, Kavish [name] ([id number]) [id number] Page 4 of 7 Electronic Signature(s) Signed: 05/12/2022 12:49:13 PM By: Angelina Pih Entered By: Angelina Pih on 05/12/2022 12:49:13 -------------------------------------------------------------------------------- Multi-Disciplinary Care Plan Details Patient Name: Date of Service: [name], [name] 05/12/2022 12:00 PM Medical Record Number: [id number] Patient Account Number: [id number] Date of Birth/Sex: Treating RN: 1960/03/02 (62 y.o. [name] Primary Care Branko Steeves: Tresa Res Other Clinician: Referring Mckinzey Entwistle: Treating Willona Phariss/Extender:  Stone, Hoyt Richmond, Taylor Weeks in Treatment: 2 Active Inactive Necrotic Tissue Nursing Diagnoses: Knowledge deficit related to management of necrotic/devitalized tissue Goals: Necrotic/devitalized tissue will be minimized in the wound bed Date Initiated: 05/12/2022 Target Resolution Date: 06/03/2022 Goal Status: Active Patient/caregiver will verbalize understanding of reason and process for debridement of necrotic tissue Date Initiated: 05/12/2022 Date Inactivated: 05/12/2022 Target Resolution Date: 05/12/2022 Goal Status: Met Interventions: Provide education on necrotic tissue and debridement process Notes: Wound/Skin Impairment Nursing Diagnoses: Impaired tissue integrity Knowledge deficit related to ulceration/compromised skin integrity Goals: Ulcer/skin breakdown will have a volume reduction of 30% by week 4 Date Initiated: 04/28/2022 Target Resolution Date: 05/26/2022 Goal Status:  Active Ulcer/skin breakdown will have a volume reduction of 50% by week 8 Date Initiated: 04/28/2022 Target Resolution Date: 06/23/2022 Goal Status: Active Ulcer/skin breakdown will have a volume reduction of 80% by week 12 Date Initiated: 04/28/2022 Target Resolution Date: 07/21/2022 Goal Status: Active Ulcer/skin breakdown will heal within 14 weeks Date Initiated: 04/28/2022 Target Resolution Date: 08/04/2022 Goal Status: Active Interventions: Assess patient/caregiver ability to obtain necessary supplies Assess patient/caregiver ability to perform ulcer/skin care regimen upon admission and as needed Assess ulceration(s) every visit Provide education on ulcer and skin care Treatment Activities: Skin care regimen initiated : 04/28/2022 Notes: Electronic Signature(s) Signed: 05/12/2022 12:50:27 PM By: Angelina Pih Entered By: Angelina Pih on 05/12/2022 12:50:27 Perrelli, Dustin Hutching ([id number]) [id number] Page 5 of 7 -------------------------------------------------------------------------------- Pain Assessment Details Patient Name: Date of Service: [name], [name] 05/12/2022 12:00 PM Medical Record Number: [id number] Patient Account Number: [id number] Date of Birth/Sex: Treating RN: 06/29/1960 (62 y.o. [name] Primary Care Nyleah Mcginnis: Tresa Res Other Clinician: Referring Garen Woolbright: Treating Mariusz Jubb/Extender: Gary Fleet Weeks in Treatment: 2 Active Problems Location of Pain Severity and Description of Pain Patient Has Paino No Site Locations Rate the pain. Current Pain Level: 0 Pain Management and Medication Current Pain Management: Electronic Signature(s) Signed: 05/12/2022 4:43:19 PM By: Angelina Pih Entered By: Angelina Pih on 05/12/2022 11:45:41 -------------------------------------------------------------------------------- Patient/Caregiver Education Details Patient Name: Date of Service: [name] 4/8/2024andnbsp12:00 PM Medical Record Number: [id number] Patient Account Number: [id number] Date of Birth/Gender: Treating RN: [birth year] (62 y.o. [name] Primary Care Physician: Tresa Res Other Clinician: Referring Physician: Treating Physician/Extender: Sigurd Sos in Treatment: 2 Education Assessment Education Provided To: Patient Education Topics Provided Wound Debridement: Handouts: Wound Debridement Methods: Explain/Verbal Responses: State content correctly Wound/Skin Impairment: Handouts: Caring for Your Ulcer Methods: Explain/Verbal Responses: State content correctly Electronic Signature(s) Signed: 05/12/2022 4:43:19 PM By: Stevphen Rochester, Dustin Hutching ([id number]) By: Angelina Pih [phone].pdf Page 6 of 7 Signed: 05/12/2022 4:43:19 PM Entered By: Angelina Pih on 05/12/2022 12:50:43 -------------------------------------------------------------------------------- Wound Assessment Details Patient Name: Date of Service: [name], [name] 05/12/2022 12:00 PM Medical Record Number: [id number] Patient Account Number: [id number] Date of Birth/Sex: Treating RN: 1960-09-23 (62 y.o. [name] Primary Care Allina Riches: Tresa Res Other Clinician: Referring Preston Garabedian: Treating Steen Bisig/Extender: Gary Fleet Weeks in Treatment: 2 Wound Status Wound Number: 2 Primary Diabetic Wound/Ulcer of the Lower Extremity Etiology: Wound Location: Right T Great oe Wound Open Wounding Event: Gradually Appeared Status: Date Acquired: 04/04/2022 Comorbid Coronary Artery Disease, Hypertension, Peripheral Venous Weeks Of Treatment: 2 History: Disease, Type II Diabetes, Neuropathy Clustered Wound: No Photos Wound Measurements Length: (cm) 0.2 Width: (cm) 0.2 Depth: (cm) 0.1 Area: (cm) 0.031 Volume: (cm) 0.003 % Reduction in Area: 67% % Reduction in Volume: 84.2% Epithelialization:  None Tunneling: No Undermining: No  Wound Description Classification: Grade 1 Exudate Amount: Medium Exudate Type: Serosanguineous Exudate Color: red, brown Foul Odor After Cleansing: No Slough/Fibrino Yes Wound Bed Granulation Amount: Large (67-100%) Exposed Structure Granulation Quality: Red, Pink Fat Layer (Subcutaneous Tissue) Exposed: Yes Necrotic Amount: Small (1-33%) Necrotic Quality: Adherent Slough Treatment Notes Wound #2 (Toe Great) Wound Laterality: Right Cleanser Soap and Water Discharge Instruction: Gently cleanse wound with antibacterial soap, rinse and pat dry prior to dressing wounds Peri-Wound Care Topical Primary Dressing Silvercel Small 2x2 (in/in) Discharge Instruction: Apply Silvercel Small 2x2 (in/in) as instructed Secondary Dressing Coverlet Latex-Free Fabric Adhesive Dressings Discharge Instruction: Erin HearingKnuckle Vanover, Nagee [name] ([id number]) [id number] Page 7 of 7 Secured With Compression Wrap Compression Stockings Add-Ons Electronic Signature(s) Signed: 05/12/2022 4:43:19 PM By: Angelina PihGordon, Caitlin Entered By: Angelina PihGordon, Caitlin on 05/12/2022 11:49:35 -------------------------------------------------------------------------------- Vitals Details Patient Name: Date of Service: [name], Dustin HutchingJERRY [name]. 05/12/2022 12:00 PM Medical Record Number: [id number] Patient Account Number: [id number] Date of Birth/Sex: Treating RN: 1960-12-14 (62 y.o. Dustin PurserM) Gordon, Caitlin Primary Care Markelle Asaro: Tresa ResWoodson, Taylor Other Clinician: Referring Keylor Rands: Treating Rigley Niess/Extender: Gary FleetStone, Hoyt Woodson, Taylor Weeks in Treatment: 2 Vital Signs Time Taken: 11:43 Temperature (F): 97.9 Height (in): 77 Pulse (bpm): 69 Weight (lbs): 257 Respiratory Rate (breaths/min): 18 Body Mass Index (BMI): 30.5 Blood Pressure (mmHg): 139/77 Reference Range: 80 - 120 mg / dl Electronic Signature(s) Signed: 05/12/2022 4:43:19 PM By: Angelina PihGordon, Caitlin Entered By: Angelina PihGordon,  Caitlin on 05/12/2022 11:45:35

## 2022-05-12 NOTE — Progress Notes (Signed)
Dustin, [name] ([id number]) [id number] Page 1 of 7 Visit Report for 05/12/2022 Chief Complaint Document Details Patient Name: Date of Service: [name], [name] 05/12/2022 12:00 PM Medical Record Number: [id number] Patient Account Number: [id number] Date of Birth/Sex: Treating RN: 1960/06/23 (62 y.o. [name] Primary Care Provider: Tresa Res Other Clinician: Referring Provider: Treating Provider/Extender: Gary Fleet Weeks in Treatment: 2 Information Obtained from: Patient Chief Complaint Right great callus buildup with concern for underlying ulcer Electronic Signature(s) Signed: 05/12/2022 12:00:33 PM By: Lenda Kelp PA-C Entered By: Lenda Kelp on 05/12/2022 12:00:33 -------------------------------------------------------------------------------- Debridement Details Patient Name: Date of Service: [name]. 05/12/2022 12:00 PM Medical Record Number: [id number] Patient Account Number: [id number] Date of Birth/Sex: Treating RN: 1960-08-31 (62 y.o. [name] Primary Care Provider: Tresa Res Other Clinician: Referring Provider: Treating Provider/Extender: Gary Fleet Weeks in Treatment: 2 Debridement Performed for Assessment: Wound #2 Right T Great oe Performed By: Physician Nelida Meuse., PA-C Debridement Type: Debridement Severity of Tissue Pre Debridement: Fat layer exposed Level of Consciousness (Pre-procedure): Awake and Alert Pre-procedure Verification/Time Out Yes - 12:01 Taken: Pain Control: Lidocaine 4% T opical Solution T Area Debrided ([name] x W): otal 0.2 (cm) x 0.2 (cm) = 0.04 (cm) Tissue and other material debrided: Viable, Non-Viable, Callus, Slough, Subcutaneous, Slough Level: Skin/Subcutaneous Tissue Debridement Description: Excisional Instrument: Curette Bleeding: Minimum Hemostasis Achieved: Pressure Response to Treatment: Procedure was tolerated  well Level of Consciousness (Post- Awake and Alert procedure): Post Debridement Measurements of Total Wound Length: (cm) 0.2 Width: (cm) 0.2 Depth: (cm) 0.1 Volume: (cm) 0.003 Character of Wound/Ulcer Post Debridement: Stable Severity of Tissue Post Debridement: Bone involvement without necrosis Post Procedure Diagnosis Same as Pre-procedure Electronic Signature(s) Signed: 05/12/2022 4:43:19 PM By: Angelina Pih Signed: 05/13/2022 6:21:14 PM By: Allen Derry PA-C Entered By: Angelina Pih on 05/12/2022 12:04:55 [name] ([id number]) [id number] Page 2 of 7 -------------------------------------------------------------------------------- HPI Details Patient Name: Date of Service: [name], [name] 05/12/2022 12:00 PM Medical Record Number: [id number] Patient Account Number: [id number] Date of Birth/Sex: Treating RN: Jun 15, 1960 (62 y.o. [name] Primary Care Provider: Tresa Res Other Clinician: Referring Provider: Treating Provider/Extender: Gary Fleet Weeks in Treatment: 2 History of Present Illness HPI Description: 11-07-2021 upon evaluation today patient appears to be doing somewhat poorly in regard to a wound on his right plantar great toe. Fortunately there does not appear to be any evidence of active infection locally or systemically at this time which is great news. No fevers, chills, nausea, vomiting, or diarrhea. With that being said he tells me that he really has not been noticing a lot of improvement either with regard to the treatment. He has been under the care of Dr. Loreta Ave up to this point for roughly a year. More recently he did have an MRI which revealed the potential for possible osteomyelitis. With that being said I do believe that he definitely should probably be monitored for this but to be honest the [name] that he was given probably did a good job. Considering there was not much being seen on MRI  I do not think there is a high likelihood of severe osteomyelitis at this point we will definitely keep an eye on things however. Nonetheless right now has been utilizing Silvadene cream I definitely think we can find something better to help get this moving in the right direction. Patient does have a history of diabetes mellitus type 2, chronic kidney disease stage III,  hypertension, and again he has had an amputation of the left leg currently. The right leg is the 1 that we are actually treating the great toe wound on. He has a below-knee amputation. 11-11-2021 upon evaluation today patient's wound actually is showing signs of excellent improvement. I am actually very pleased even compared to last week this is already better. He is very happy as well. 12-02-2021 upon evaluation today patient appears to be doing pretty well in regard to his toe ulcer this been several weeks since I saw him part of it was our scheduling with me being out of town and part of it was he actually missed an appointment secondary to his trunk the will actually fell off and he had some issues there getting it fixed on Monday Dustin he missed his appointment last Monday. Nonetheless the good news is in today things appear to be doing well no signs of infection though he does have a pretty good need for sharp debridement to try to clear some of this away. 11/6; right first toes ulcer complicated by his left-sided BKA.Marland KitchenNo major change from last visit 12-16-2021 upon evaluation today patient appears to be doing well currently in regard to his toe ulcer that was not making a good amount of improvement yet I do believe the total contact cast is really good to be necessary. I talked about that before he tells me his wife can drive him around he is done with working now also he can actually look towards doing this. 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 12-30-2021 upon evaluation today patient appears to be doing well currently in regard to his foot ulcer on the toe although he is still building up quite a bit of callus. Will actually get the total contact cast on him today and started which I think is good to be very beneficial. 01-02-2022 upon evaluation today patient appears to be doing much better in regard to his wound. The cast does seem to do excellent for him. Fortunately there does not appear to be any evidence of active infections at this time. 01-09-2022 upon evaluation today patient appears to be doing excellent in regard to his wound. He is actually showing signs of excellent improvement and in general I think were very close to complete resolution. In fact there might be just a really tiny area, 0.1, still open but again this is much better than where we were prior to put the cast on. Its only been a little over a week since we put the first cast in place. 01-17-2022 upon 11 valuation today patient appears to be doing well currently in regard to his toe ulcer. In fact he appears to be completely healed based on what I am seeing at this point. I do not see any evidence of anything open. Readmission: 04-28-2022 upon evaluation today patient presents for reevaluation here in the clinic I actually saw him last January 17, 2022. At that point he was completely healed. With that being said unfortunately he notes that he has been having some callus buildup in regard to the toe and he figured he should come in and get this checked out sooner rather than later. This is the medial aspect of the right great toe again he has a below-knee amputation on the left. Fortunately I do not see any signs of infection there  is a lot of callus that was not definitively open when I first saw this but we did perform debridement to clear this away that we detailed in the objective section. 05-05-2022 upon evaluation today  patient appears to be doing well currently in regard to his wound. I do not think this is getting much worse he still has some callus buildup but he is supposed to be getting his new diabetic shoes shortly which I think is can help a lot with this. Fortunately I do not see any signs of active infection locally or systemically which is great news. No fevers, chills, nausea, vomiting, or diarrhea. 05-12-2022 upon evaluation today patient appears to be doing well with regard to his toe ulcer. This is actually showing signs of improvement which is great news. Fortunately I do not see any signs of active infection locally nor systemically at this time which is great news. No fevers, chills, nausea, vomiting, or diarrhea. Electronic Signature(s) Signed: 05/12/2022 12:47:55 PM By: Lenda KelpStone III, Jimmye Wisnieski PA-C Entered By: Lenda KelpStone III, Brunella Wileman on 05/12/2022 12:47:55 -------------------------------------------------------------------------------- Physical Exam Details Patient Name: Date of Service: Dustin HurtSO [name], [name]. 05/12/2022 12:00 PM Medical Record Number: [id number] Patient Account Number: [id number] Date of Birth/Sex: Treating RN: [birth year] (62 y.o. Dustin PurserM) Gordon, Caitlin Primary Care Provider: Tresa ResWoodson, Taylor Other Clinician: Referring Provider: Treating Provider/Extender: Gary FleetStone, Yatziri Wainwright Woodson, Taylor Weeks in Treatment: 2 TimpsonSOUTHERN, Doy HutchingJERRY [name] ([id number]) [id number] Page 3 of 7 Constitutional Well-nourished and well-hydrated in no acute distress. Respiratory normal breathing without difficulty. Psychiatric this patient is able to make decisions and demonstrates good insight into disease process. Alert and Oriented x 3. pleasant and cooperative. Notes Upon inspection patient's wound bed actually showed signs of good granulation epithelization at this point. Fortunately I do not see any evidence of active infection locally nor systemically which is great news and overall I am extremely  pleased with where we stand currently. Electronic Signature(s) Signed: 05/12/2022 12:48:23 PM By: Lenda KelpStone III, Daleysa Kristiansen PA-C Entered By: Lenda KelpStone III, Rondy Krupinski on 05/12/2022 12:48:22 -------------------------------------------------------------------------------- Physician Orders Details Patient Name: Date of Service: Dustin HurtSO [name], [name]. 05/12/2022 12:00 PM Medical Record Number: [id number] Patient Account Number: [id number] Date of Birth/Sex: Treating RN: [birth year] (62 y.o. Dustin PurserM) Gordon, Caitlin Primary Care Provider: Tresa ResWoodson, Taylor Other Clinician: Referring Provider: Treating Provider/Extender: Gary FleetStone, Caidyn Henricksen Woodson, Taylor Weeks in Treatment: 2 Verbal / Phone Orders: No Diagnosis Coding ICD-10 Coding Code Description E11.621 Type 2 diabetes mellitus with foot ulcer L97.512 Non-pressure chronic ulcer of other part of right foot with fat layer exposed L84 Corns and callosities N18.30 Chronic kidney disease, stage 3 unspecified I10 Essential (primary) hypertension Z89.512 Acquired absence of left leg below knee Follow-up Appointments Return Appointment in 1 week. Bathing/ Applied MaterialsShower/ Hygiene Wash wounds with antibacterial soap and water. May shower; gently cleanse wound with antibacterial soap, rinse and pat dry prior to dressing wounds No tub bath. Anesthetic (Use 'Patient Medications' Section for Anesthetic Order Entry) Lidocaine applied to wound bed Wound Treatment Wound #2 - T Great oe Wound Laterality: Right Cleanser: Soap and Water 3 x Per Week/30 Days Discharge Instructions: Gently cleanse wound with antibacterial soap, rinse and pat dry prior to dressing wounds Prim Dressing: Silvercel Small 2x2 (in/in) 3 x Per Week/30 Days ary Discharge Instructions: Apply Silvercel Small 2x2 (in/in) as instructed Secondary Dressing: Coverlet Latex-Free Fabric Adhesive Dressings 3 x Per Week/30 Days Discharge Instructions: Knuckle Electronic Signature(s) Signed: 05/12/2022 12:49:27 PM By: Angelina PihGordon,  Caitlin Signed: 05/13/2022 6:21:14 PM By: Allen DerryStone, Jaquavius Hudler PA-C Entered By: Angelina PihGordon, Caitlin on  05/12/2022 12:49:27 [name], [name] ([id number]) [id number] Page 4 of 7 -------------------------------------------------------------------------------- Problem List Details Patient Name: Date of Service: [name], [name] 05/12/2022 12:00 PM Medical Record Number: [id number] Patient Account Number: [id number] Date of Birth/Sex: Treating RN: 05-08-1960 (62 y.o. [name] Primary Care Provider: Tresa Res Other Clinician: Referring Provider: Treating Provider/Extender: Gary Fleet Weeks in Treatment: 2 Active Problems ICD-10 Encounter Code Description Active Date MDM Diagnosis E11.621 Type 2 diabetes mellitus with foot ulcer 04/28/2022 No Yes L97.512 Non-pressure chronic ulcer of other part of right foot with fat layer exposed 04/28/2022 No Yes L84 Corns and callosities 04/28/2022 No Yes N18.30 Chronic kidney disease, stage 3 unspecified 04/28/2022 No Yes I10 Essential (primary) hypertension 04/28/2022 No Yes Z89.512 Acquired absence of left leg below knee 04/28/2022 No Yes Inactive Problems Resolved Problems Electronic Signature(s) Signed: 05/12/2022 12:00:30 PM By: Lenda Kelp PA-C Entered By: Lenda Kelp on 05/12/2022 12:00:30 -------------------------------------------------------------------------------- Progress Note Details Patient Name: Date of Service: [name]. 05/12/2022 12:00 PM Medical Record Number: [id number] Patient Account Number: [id number] Date of Birth/Sex: Treating RN: 09/24/1960 (62 y.o. [name] Primary Care Provider: Tresa Res Other Clinician: Referring Provider: Treating Provider/Extender: Gary Fleet Weeks in Treatment: 2 Subjective Chief Complaint Information obtained from Patient Right great callus buildup with concern for underlying ulcer History of Present  Illness (HPI) 11-07-2021 upon evaluation today patient appears to be doing somewhat poorly in regard to a wound on his right plantar great toe. Fortunately there does not appear to be any evidence of active infection locally or systemically at this time which is great news. No fevers, chills, nausea, vomiting, or diarrhea. With that being said he tells me that he really has not been noticing a lot of improvement either with regard to the treatment. He has been under the care of Dr. Loreta Ave up to this point for roughly a year. More recently he did have an MRI which revealed the potential for possible osteomyelitis. With that being said I do believe that he definitely should probably be monitored for this but to be honest the [name] that he was given probably did a good job. Considering there was not much being seen on MRI I do not think there is a high likelihood of severe osteomyelitis at this point we will definitely keep an eye on things however. Nonetheless right now has been utilizing Silvadene cream I definitely think we can find something better to help get this moving in the right direction. [name], [name] ([id number]) [id number] Page 5 of 7 Patient does have a history of diabetes mellitus type 2, chronic kidney disease stage III, hypertension, and again he has had an amputation of the left leg currently. The right leg is the 1 that we are actually treating the great toe wound on. He has a below-knee amputation. 11-11-2021 upon evaluation today patient's wound actually is showing signs of excellent improvement. I am actually very pleased even compared to last week this is already better. He is very happy as well. 12-02-2021 upon evaluation today patient appears to be doing pretty well in regard to his toe ulcer this been several weeks since I saw him part of it was our scheduling with me being out of town and part of it was he actually missed an appointment secondary  to his trunk the will actually fell off and he had some issues there getting it fixed on Monday Dustin he missed his appointment last Monday. Nonetheless the  good news is in today things appear to be doing well no signs of infection though he does have a pretty good need for sharp debridement to try to clear some of this away. 11/6; right first toes ulcer complicated by his left-sided BKA.Marland KitchenNo major change from last visit 12-16-2021 upon evaluation today patient appears to be doing well currently in regard to his toe ulcer that was not making a good amount of improvement yet I do believe the total contact cast is really good to be necessary. I talked about that before he tells me his wife can drive him around he is done with working now also he can actually look towards doing this. 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 12-30-2021 upon evaluation today patient appears to be doing well currently in regard to his foot ulcer on the toe although he is still building up quite a bit of callus. Will actually get the total contact cast on him today and started which I think is good to be very beneficial. 01-02-2022 upon evaluation today patient appears to be doing much better in regard to his wound. The cast does seem to do excellent for him. Fortunately there does not appear to be any evidence of active infections at this time. 01-09-2022 upon evaluation today patient appears to be doing excellent in regard to his wound. He is actually showing signs of excellent improvement and in general I think were very close to complete resolution. In fact there might be just a really tiny area, 0.1, still open but again this is much better than where we were prior to put the cast on. Its only been a little over a week since we put the first cast in place. 01-17-2022 upon 11 valuation today patient appears to  be doing well currently in regard to his toe ulcer. In fact he appears to be completely healed based on what I am seeing at this point. I do not see any evidence of anything open. Readmission: 04-28-2022 upon evaluation today patient presents for reevaluation here in the clinic I actually saw him last January 17, 2022. At that point he was completely healed. With that being said unfortunately he notes that he has been having some callus buildup in regard to the toe and he figured he should come in and get this checked out sooner rather than later. This is the medial aspect of the right great toe again he has a below-knee amputation on the left. Fortunately I do not see any signs of infection there is a lot of callus that was not definitively open when I first saw this but we did perform debridement to clear this away that we detailed in the objective section. 05-05-2022 upon evaluation today patient appears to be doing well currently in regard to his wound. I do not think this is getting much worse he still has some callus buildup but he is supposed to be getting his new diabetic shoes shortly which I think is can help a lot with this. Fortunately I do not see any signs of active infection locally or systemically which is great news. No fevers, chills, nausea, vomiting, or diarrhea. 05-12-2022 upon evaluation today patient appears to be doing well with regard to his toe ulcer. This is actually showing signs of improvement which is great news. Fortunately I do not see any signs of  active infection locally nor systemically at this time which is great news. No fevers, chills, nausea, vomiting, or diarrhea. Objective Constitutional Well-nourished and well-hydrated in no acute distress. Vitals Time Taken: 11:43 AM, Height: 77 in, Weight: 257 lbs, BMI: 30.5, Temperature: 97.9 F, Pulse: 69 bpm, Respiratory Rate: 18 breaths/min, Blood Pressure: 139/77 mmHg. Respiratory normal breathing without  difficulty. Psychiatric this patient is able to make decisions and demonstrates good insight into disease process. Alert and Oriented x 3. pleasant and cooperative. General Notes: Upon inspection patient's wound bed actually showed signs of good granulation epithelization at this point. Fortunately I do not see any evidence of active infection locally nor systemically which is great news and overall I am extremely pleased with where we stand currently. Integumentary (Hair, Skin) Wound #2 status is Open. Original cause of wound was Gradually Appeared. The date acquired was: 04/04/2022. The wound has been in treatment 2 weeks. The wound is located on the Right T Great. The wound measures 0.2cm length x 0.2cm width x 0.1cm depth; 0.031cm^2 area and 0.003cm^3 volume. There is Fat oe Layer (Subcutaneous Tissue) exposed. There is no tunneling or undermining noted. There is a medium amount of serosanguineous drainage noted. There is large (67-100%) red, pink granulation within the wound bed. There is a small (1-33%) amount of necrotic tissue within the wound bed including Adherent Slough. Assessment Active Problems ICD-10 Type 2 diabetes mellitus with foot ulcer Non-pressure chronic ulcer of other part of right foot with fat layer exposed [name], [name] ([id number]) [id number] Page 6 of 7 Corns and callosities Chronic kidney disease, stage 3 unspecified Essential (primary) hypertension Acquired absence of left leg below knee Procedures Wound #2 Pre-procedure diagnosis of Wound #2 is a Diabetic Wound/Ulcer of the Lower Extremity located on the Right T Great .Severity of Tissue Pre Debridement is: oe Fat layer exposed. There was a Excisional Skin/Subcutaneous Tissue Debridement with a total area of 0.04 sq cm performed by Nelida Meuse., PA-C. With the following instrument(s): Curette to remove Viable and Non-Viable tissue/material. Material removed includes Callus,  Subcutaneous Tissue, and Slough after achieving pain control using Lidocaine 4% T opical Solution. No specimens were taken. A time out was conducted at 12:01, prior to the start of the procedure. A Minimum amount of bleeding was controlled with Pressure. The procedure was tolerated well. Post Debridement Measurements: 0.2cm length x 0.2cm width x 0.1cm depth; 0.003cm^3 volume. Character of Wound/Ulcer Post Debridement is stable. Severity of Tissue Post Debridement is: Bone involvement without necrosis. Post procedure Diagnosis Wound #2: Same as Pre-Procedure Plan Follow-up Appointments: Return Appointment in 1 week. Bathing/ Shower/ Hygiene: Wash wounds with antibacterial soap and water. May shower; gently cleanse wound with antibacterial soap, rinse and pat dry prior to dressing wounds No tub bath. Anesthetic (Use 'Patient Medications' Section for Anesthetic Order Entry): Lidocaine applied to wound bed WOUND #2: - T Great Wound Laterality: Right oe Cleanser: Soap and Water 3 x Per Week/30 Days Discharge Instructions: Gently cleanse wound with antibacterial soap, rinse and pat dry prior to dressing wounds Prim Dressing: Silvercel Small 2x2 (in/in) 3 x Per Week/30 Days ary Discharge Instructions: Apply Silvercel Small 2x2 (in/in) as instructed Secondary Dressing: Coverlet Latex-Free Fabric Adhesive Dressings 3 x Per Week/30 Days Discharge Instructions: Knuckle 1. Would recommend currently that the patient should continue to monitor for any signs of worsening. Right now I feel like really making good progress and I am not I hope that he continues down this path. 2. I am also  can recommend that the patient should continue to use the silver alginate dressing which I think is doing a good job. 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 We will see patient back for reevaluation in 1 week here in the clinic. If anything worsens or changes patient will contact our office for additional recommendations. Electronic Signature(s) Signed: 05/12/2022 12:49:01 PM By: Lenda Kelp PA-C Entered By: Lenda Kelp on 05/12/2022 12:49:01 -------------------------------------------------------------------------------- SuperBill Details Patient Name: Date of Service: [name] 05/12/2022 Medical Record Number: [id number] Patient Account Number: [id number] Date of Birth/Sex: Treating RN: 1960/04/03 (62 y.o. [name] Primary Care Provider: Tresa Res Other Clinician: Referring Provider: Treating Provider/Extender: Gary Fleet Weeks in Treatment: 2 Diagnosis Coding ICD-10 Codes Code Description E11.621 Type 2 diabetes mellitus with foot ulcer L97.512 Non-pressure chronic ulcer of other part of right foot with fat layer exposed L84 Corns and callosities [name], [name] [name] ([id number]) [id number] Page 7 of 7 N18.30 Chronic kidney disease, stage 3 unspecified I10 Essential (primary) hypertension Z89.512 Acquired absence of left leg below knee Facility Procedures : CPT4 Code: [id number] Description: 11042 - DEB SUBQ TISSUE 20 SQ CM/< ICD-10 Diagnosis Description L97.512 Non-pressure chronic ulcer of other part of right foot with fat layer exposed Modifier: Quantity: 1 Physician Procedures : CPT4 Code Description Modifier [id number] 11042 - WC PHYS SUBQ TISS 20 SQ CM ICD-10 Diagnosis Description L97.512 Non-pressure chronic ulcer of other part of right foot with fat layer exposed Quantity: 1 Electronic Signature(s) Signed: 05/12/2022 12:52:01 PM By: Lenda Kelp PA-C Entered By: Lenda Kelp on 05/12/2022 12:52:01

## 2022-05-13 ENCOUNTER — Ambulatory Visit: Payer: HMO | Admitting: Podiatry

## 2022-05-19 ENCOUNTER — Encounter: Payer: PPO | Admitting: Physician Assistant

## 2022-05-19 DIAGNOSIS — L97512 Non-pressure chronic ulcer of other part of right foot with fat layer exposed: Secondary | ICD-10-CM | POA: Diagnosis not present

## 2022-05-19 DIAGNOSIS — E11621 Type 2 diabetes mellitus with foot ulcer: Secondary | ICD-10-CM | POA: Diagnosis not present

## 2022-05-19 NOTE — Progress Notes (Signed)
HAROUT, SCHEURICH ([id number]) [id number] Page 1 of 7 Visit Report for 05/19/2022 Chief Complaint Document Details Patient Name: Date of Service: [name], [name] 05/19/2022 9:45 A M Medical Record Number: [id number] Patient Account Number: [id number] Date of Birth/Sex: Treating RN: July 26, 1960 (62 y.o. [name] Provider: Tresa Res Other Clinician: Referring Provider: Treating Provider/Extender: Gary Fleet Weeks in Treatment: 3 Information Obtained from: Patient Chief Complaint Right great callus buildup with concern for underlying ulcer Electronic Signature(s) Signed: 05/19/2022 9:35:34 AM By: Allen Derry PA-C Entered By: Allen Derry on 05/19/2022 09:35:34 -------------------------------------------------------------------------------- Debridement Details Patient Name: Date of Service: [name]. 05/19/2022 9:45 A M Medical Record Number: [id number] Patient Account Number: [id number] Date of Birth/Sex: Treating RN: August 14, 1960 (62 y.o. [name] Provider: Tresa Res Other Clinician: Referring Provider: Treating Provider/Extender: Gary Fleet Weeks in Treatment: 3 Debridement Performed for Assessment: Wound #2 Right T Great oe Performed By: Physician Allen Derry, PA-C Debridement Type: Debridement Severity of Tissue Pre Debridement: Fat layer exposed Level of Consciousness (Pre-procedure): Awake and Alert Pre-procedure Verification/Time Out Yes - 09:46 Taken: Pain Control: Lidocaine 4% T opical Solution T Area Debrided (L x W): otal 0.2 (cm) x 0.2 (cm) = 0.04 (cm) Tissue and other material debrided: Viable, Non-Viable, Callus, Slough, Subcutaneous, Slough Level: Skin/Subcutaneous Tissue Debridement Description: Excisional Instrument: Curette Bleeding: Moderate Hemostasis Achieved: Pressure Response to Treatment: Procedure was tolerated well Level of  Consciousness (Post- Awake and Alert procedure): Post Debridement Measurements of Total Wound Length: (cm) 0.2 Width: (cm) 0.2 Depth: (cm) 0.1 Volume: (cm) 0.003 Character of Wound/Ulcer Post Debridement: Stable Severity of Tissue Post Debridement: Fat layer exposed Post Procedure Diagnosis Same as Pre-procedure Electronic Signature(s) Signed: 05/19/2022 4:33:05 PM By: Angelina Pih Signed: 05/20/2022 8:44:52 AM By: Allen Derry PA-C Entered By: Angelina Pih on 05/19/2022 09:58:37 Sedgwick, [name] ([id number]) [id number] Page 2 of 7 -------------------------------------------------------------------------------- HPI Details Patient Name: Date of Service: [name] 05/19/2022 9:45 A M Medical Record Number: [id number] Patient Account Number: [id number] Date of Birth/Sex: Treating RN: 1961/01/05 (62 y.o. [name] Provider: Tresa Res Other Clinician: Referring Provider: Treating Provider/Extender: Gary Fleet Weeks in Treatment: 3 History of Present Illness HPI Description: 11-07-2021 upon evaluation today patient appears to be doing somewhat poorly in regard to a wound on his right plantar great toe. Fortunately there does not appear to be any evidence of active infection locally or systemically at this time which is great news. No fevers, chills, nausea, vomiting, or diarrhea. With that being said he tells me that he really has not been noticing a lot of improvement either with regard to the treatment. He has been under the care of Dr. Loreta Ave up to this point for roughly a year. More recently he did have an MRI which revealed the potential for possible osteomyelitis. With that being said I do believe that he definitely should probably be monitored for this but to be honest the [name] that he was given probably did a good job. Considering there was not much being seen on MRI I do not think there is a  high likelihood of severe osteomyelitis at this point we will definitely keep an eye on things however. Nonetheless right now has been utilizing Silvadene cream I definitely think we can find something better to help get this moving in the right direction. Patient does have a history of diabetes mellitus type 2, chronic kidney disease stage III, hypertension,  and again he has had an amputation of the left leg currently. The right leg is the 1 that we are actually treating the great toe wound on. He has a below-knee amputation. 11-11-2021 upon evaluation today patient's wound actually is showing signs of excellent improvement. I am actually very pleased even compared to last week this is already better. He is very happy as well. 12-02-2021 upon evaluation today patient appears to be doing pretty well in regard to his toe ulcer this been several weeks since I saw him part of it was our scheduling with me being out of town and part of it was he actually missed an appointment secondary to his trunk the will actually fell off and he had some issues there getting it fixed on Monday so he missed his appointment last Monday. Nonetheless the good news is in today things appear to be doing well no signs of infection though he does have a pretty good need for sharp debridement to try to clear some of this away. 11/6; right first toes ulcer complicated by his left-sided BKA.Marland KitchenNo major change from last visit 12-16-2021 upon evaluation today patient appears to be doing well currently in regard to his toe ulcer that was not making a good amount of improvement yet I do believe the total contact cast is really good to be necessary. I talked about that before he tells me his wife can drive him around he is done with working now also he can actually look towards doing this. 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 12-30-2021 upon evaluation today patient appears to be doing well currently in regard to his foot ulcer on the toe although he is still building up quite a bit of callus. Will actually get the total contact cast on him today and started which I think is good to be very beneficial. 01-02-2022 upon evaluation today patient appears to be doing much better in regard to his wound. The cast does seem to do excellent for him. Fortunately there does not appear to be any evidence of active infections at this time. 01-09-2022 upon evaluation today patient appears to be doing excellent in regard to his wound. He is actually showing signs of excellent improvement and in general I think were very close to complete resolution. In fact there might be just a really tiny area, 0.1, still open but again this is much better than where we were prior to put the cast on. Its only been a little over a week since we put the first cast in place. 01-17-2022 upon 11 valuation today patient appears to be doing well currently in regard to his toe ulcer. In fact he appears to be completely healed based on what I am seeing at this point. I do not see any evidence of anything open. Readmission: 04-28-2022 upon evaluation today patient presents for reevaluation here in the clinic I actually saw him last January 17, 2022. At that point he was completely healed. With that being said unfortunately he notes that he has been having some callus buildup in regard to the toe and he figured he should come in and get this checked out sooner rather than later. This is the medial aspect of the right great toe again he has a below-knee amputation on the left. Fortunately I do not see any signs of infection there is  a lot of callus that was not definitively open when I first saw this but we did perform debridement to clear this away that we detailed in the objective section. 05-05-2022 upon evaluation today patient appears to be doing  well currently in regard to his wound. I do not think this is getting much worse he still has some callus buildup but he is supposed to be getting his new diabetic shoes shortly which I think is can help a lot with this. Fortunately I do not see any signs of active infection locally or systemically which is great news. No fevers, chills, nausea, vomiting, or diarrhea. 05-12-2022 upon evaluation today patient appears to be doing well with regard to his toe ulcer. This is actually showing signs of improvement which is great news. Fortunately I do not see any signs of active infection locally nor systemically at this time which is great news. No fevers, chills, nausea, vomiting, or diarrhea. 05-19-2022 upon evaluation today patient appears to be doing well currently in regard to his wound although it is really not much better it is about the same. Fortunately I do not see any signs of active infection locally or systemically which is great news. No fevers, chills, nausea, vomiting, or diarrhea. Electronic Signature(s) Signed: 05/19/2022 10:32:54 AM By: Allen Derry PA-C Previous Signature: 05/19/2022 10:32:37 AM Version By: Allen Derry PA-C Entered By: Allen Derry on 05/19/2022 10:32:54 -------------------------------------------------------------------------------- Physical Exam Details Patient Name: Date of Service: [name] 05/19/2022 9:45 A M Medical Record Number: [id number] Patient Account Number: [id number] Date of Birth/Sex: Treating RN: Feb 10, 1960 (62 y.o. [name] Provider: Tresa Res Other Clinician: CHRISTERPHER, CLOS ([id number]) [id number] Page 3 of 7 Referring Provider: Treating Provider/Extender: Gary Fleet Weeks in Treatment: 3 Constitutional Well-nourished and well-hydrated in no acute distress. Respiratory normal breathing without difficulty. Psychiatric this patient is able to make decisions and  demonstrates good insight into disease process. Alert and Oriented x 3. pleasant and cooperative. Notes Upon inspection patient's wound showed signs of good granulation epithelization at this point. Fortunately I do not see any evidence of active infection locally nor systemically which is great news and overall I am extremely pleased with where we stand currently. I think though that we need to try to get this thing close faster and I believe switching to collagen may be a better way to go he is in agreement with plan we will use a little bit of foam over top still using a Band-Aid. He does have his new shoes as well. Electronic Signature(s) Signed: 05/19/2022 10:33:26 AM By: Allen Derry PA-C Entered By: Allen Derry on 05/19/2022 10:33:26 -------------------------------------------------------------------------------- Physician Orders Details Patient Name: Date of Service: [name] 05/19/2022 9:45 A M Medical Record Number: [id number] Patient Account Number: [id number] Date of Birth/Sex: Treating RN: August 19, 1960 (62 y.o. [name] Provider: Tresa Res Other Clinician: Referring Provider: Treating Provider/Extender: Gary Fleet Weeks in Treatment: 3 Verbal / Phone Orders: No Diagnosis Coding ICD-10 Coding Code Description E11.621 Type 2 diabetes mellitus with foot ulcer L97.512 Non-pressure chronic ulcer of other part of right foot with fat layer exposed L84 Corns and callosities N18.30 Chronic kidney disease, stage 3 unspecified I10 Essential (primary) hypertension Z89.512 Acquired absence of left leg below knee Follow-up Appointments Return Appointment in 1 week. Bathing/ [employer] wounds with antibacterial soap and water. May shower; gently cleanse wound with antibacterial soap, rinse and pat dry prior to dressing wounds  No tub bath. Anesthetic (Use 'Patient Medications' Section for Anesthetic Order Entry) Lidocaine  applied to wound bed Wound Treatment Wound #2 - T Great oe Wound Laterality: Right Cleanser: Soap and Water 3 x Per Week/30 Days Discharge Instructions: Gently cleanse wound with antibacterial soap, rinse and pat dry prior to dressing wounds Prim Dressing: Prisma 4.34 (in) 3 x Per Week/30 Days ary Discharge Instructions: Moisten w/normal saline or sterile water; Cover wound as directed. Do not remove from wound bed. Secondary Dressing: Coverlet Latex-Free Fabric Adhesive Dressings 3 x Per Week/30 Days Discharge Instructions: Knuckle Secondary Dressing: Foam Dressing, 4x4 (in/in) 3 x Per Week/30 Days Discharge Instructions: cut small square for padding over prisma Basulto, Alcario L ([id number]) [phone].pdf Page 4 of 7 Electronic Signature(s) Signed: 05/19/2022 4:33:05 PM By: Angelina Pih Signed: 05/20/2022 8:44:52 AM By: Allen Derry PA-C Entered By: Angelina Pih on 05/19/2022 09:58:11 -------------------------------------------------------------------------------- Problem List Details Patient Name: Date of Service: [name]. 05/19/2022 9:45 A M Medical Record Number: [id number] Patient Account Number: [id number] Date of Birth/Sex: Treating RN: 12/01/1960 (62 y.o. [name] Provider: Tresa Res Other Clinician: Referring Provider: Treating Provider/Extender: Gary Fleet Weeks in Treatment: 3 Active Problems ICD-10 Encounter Code Description Active Date MDM Diagnosis E11.621 Type 2 diabetes mellitus with foot ulcer 04/28/2022 No Yes L97.512 Non-pressure chronic ulcer of other part of right foot with fat layer exposed 04/28/2022 No Yes L84 Corns and callosities 04/28/2022 No Yes N18.30 Chronic kidney disease, stage 3 unspecified 04/28/2022 No Yes I10 Essential (primary) hypertension 04/28/2022 No Yes Z89.512 Acquired absence of left leg below knee 04/28/2022 No Yes Inactive Problems Resolved  Problems Electronic Signature(s) Signed: 05/19/2022 9:35:30 AM By: Allen Derry PA-C Entered By: Allen Derry on 05/19/2022 09:35:30 -------------------------------------------------------------------------------- Progress Note Details Patient Name: Date of Service: [name]. 05/19/2022 9:45 A M Medical Record Number: [id number] Patient Account Number: [id number] Date of Birth/Sex: Treating RN: 1960-04-28 (62 y.o. [name] Provider: Tresa Res Other Clinician: Referring Provider: Treating Provider/Extender: Gary Fleet Weeks in Treatment: 3 Subjective Chief Complaint Information obtained from Patient Right great callus buildup with concern for underlying ulcer Senne, Somnang L ([id number]) [phone].pdf Page 5 of 7 History of Present Illness (HPI) 11-07-2021 upon evaluation today patient appears to be doing somewhat poorly in regard to a wound on his right plantar great toe. Fortunately there does not appear to be any evidence of active infection locally or systemically at this time which is great news. No fevers, chills, nausea, vomiting, or diarrhea. With that being said he tells me that he really has not been noticing a lot of improvement either with regard to the treatment. He has been under the care of Dr. Loreta Ave up to this point for roughly a year. More recently he did have an MRI which revealed the potential for possible osteomyelitis. With that being said I do believe that he definitely should probably be monitored for this but to be honest the [name] that he was given probably did a good job. Considering there was not much being seen on MRI I do not think there is a high likelihood of severe osteomyelitis at this point we will definitely keep an eye on things however. Nonetheless right now has been utilizing Silvadene cream I definitely think we can find something better to help get this moving in the right  direction. Patient does have a history of diabetes mellitus type 2, chronic kidney disease stage III, hypertension, and again  he has had an amputation of the left leg currently. The right leg is the 1 that we are actually treating the great toe wound on. He has a below-knee amputation. 11-11-2021 upon evaluation today patient's wound actually is showing signs of excellent improvement. I am actually very pleased even compared to last week this is already better. He is very happy as well. 12-02-2021 upon evaluation today patient appears to be doing pretty well in regard to his toe ulcer this been several weeks since I saw him part of it was our scheduling with me being out of town and part of it was he actually missed an appointment secondary to his trunk the will actually fell off and he had some issues there getting it fixed on Monday so he missed his appointment last Monday. Nonetheless the good news is in today things appear to be doing well no signs of infection though he does have a pretty good need for sharp debridement to try to clear some of this away. 11/6; right first toes ulcer complicated by his left-sided BKA.Marland KitchenNo major change from last visit 12-16-2021 upon evaluation today patient appears to be doing well currently in regard to his toe ulcer that was not making a good amount of improvement yet I do believe the total contact cast is really good to be necessary. I talked about that before he tells me his wife can drive him around he is done with working now also he can actually look towards doing this. 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 12-30-2021 upon evaluation today patient appears to be doing well currently in regard to his foot ulcer on the toe although he is still building up quite a bit of callus. Will actually get the total contact cast on him today and started  which I think is good to be very beneficial. 01-02-2022 upon evaluation today patient appears to be doing much better in regard to his wound. The cast does seem to do excellent for him. Fortunately there does not appear to be any evidence of active infections at this time. 01-09-2022 upon evaluation today patient appears to be doing excellent in regard to his wound. He is actually showing signs of excellent improvement and in general I think were very close to complete resolution. In fact there might be just a really tiny area, 0.1, still open but again this is much better than where we were prior to put the cast on. Its only been a little over a week since we put the first cast in place. 01-17-2022 upon 11 valuation today patient appears to be doing well currently in regard to his toe ulcer. In fact he appears to be completely healed based on what I am seeing at this point. I do not see any evidence of anything open. Readmission: 04-28-2022 upon evaluation today patient presents for reevaluation here in the clinic I actually saw him last January 17, 2022. At that point he was completely healed. With that being said unfortunately he notes that he has been having some callus buildup in regard to the toe and he figured he should come in and get this checked out sooner rather than later. This is the medial aspect of the right great toe again he has a below-knee amputation on the left. Fortunately I do not see any signs of infection there is a lot  of callus that was not definitively open when I first saw this but we did perform debridement to clear this away that we detailed in the objective section. 05-05-2022 upon evaluation today patient appears to be doing well currently in regard to his wound. I do not think this is getting much worse he still has some callus buildup but he is supposed to be getting his new diabetic shoes shortly which I think is can help a lot with this. Fortunately I do not see any  signs of active infection locally or systemically which is great news. No fevers, chills, nausea, vomiting, or diarrhea. 05-12-2022 upon evaluation today patient appears to be doing well with regard to his toe ulcer. This is actually showing signs of improvement which is great news. Fortunately I do not see any signs of active infection locally nor systemically at this time which is great news. No fevers, chills, nausea, vomiting, or diarrhea. 05-19-2022 upon evaluation today patient appears to be doing well currently in regard to his wound although it is really not much better it is about the same. Fortunately I do not see any signs of active infection locally or systemically which is great news. No fevers, chills, nausea, vomiting, or diarrhea. Objective Constitutional Well-nourished and well-hydrated in no acute distress. Vitals Time Taken: 9:17 AM, Height: 77 in, Weight: 257 lbs, BMI: 30.5, Temperature: 97.8 F, Pulse: 76 bpm, Respiratory Rate: 18 breaths/min, Blood Pressure: 127/73 mmHg. Respiratory normal breathing without difficulty. Psychiatric this patient is able to make decisions and demonstrates good insight into disease process. Alert and Oriented x 3. pleasant and cooperative. General Notes: Upon inspection patient's wound showed signs of good granulation epithelization at this point. Fortunately I do not see any evidence of active infection locally nor systemically which is great news and overall I am extremely pleased with where we stand currently. I think though that we need to try to get this thing close faster and I believe switching to collagen may be a better way to go he is in agreement with plan we will use a little bit of foam over top still using a Band-Aid. He does have his new shoes as well. Integumentary (Hair, Skin) Wound #2 status is Open. Original cause of wound was Gradually Appeared. The date acquired was: 04/04/2022. The wound has been in treatment 3 weeks.  The wound is located on the Right T Great. The wound measures 0.2cm length x 0.2cm width x 0.1cm depth; 0.031cm^2 area and 0.003cm^3 volume. There is Fat oe Ballow, Jagdeep L ([id number]) [phone].pdf Page 6 of 7 Layer (Subcutaneous Tissue) exposed. There is no tunneling or undermining noted. There is a medium amount of serosanguineous drainage noted. There is large (67-100%) pink granulation within the wound bed. There is a small (1-33%) amount of necrotic tissue within the wound bed including Adherent Slough. Assessment Active Problems ICD-10 Type 2 diabetes mellitus with foot ulcer Non-pressure chronic ulcer of other part of right foot with fat layer exposed Corns and callosities Chronic kidney disease, stage 3 unspecified Essential (primary) hypertension Acquired absence of left leg below knee Procedures Wound #2 Pre-procedure diagnosis of Wound #2 is a Diabetic Wound/Ulcer of the Lower Extremity located on the Right T Great .Severity of Tissue Pre Debridement is: oe Fat layer exposed. There was a Excisional Skin/Subcutaneous Tissue Debridement with a total area of 0.04 sq cm performed by Allen Derry, PA-C. With the following instrument(s): Curette to remove Viable and Non-Viable tissue/material. Material removed includes Callus, Subcutaneous Tissue, and  Slough after achieving pain control using Lidocaine 4% Topical Solution. No specimens were taken. A time out was conducted at 09:46, prior to the start of the procedure. A Moderate amount of bleeding was controlled with Pressure. The procedure was tolerated well. Post Debridement Measurements: 0.2cm length x 0.2cm width x 0.1cm depth; 0.003cm^3 volume. Character of Wound/Ulcer Post Debridement is stable. Severity of Tissue Post Debridement is: Fat layer exposed. Post procedure Diagnosis Wound #2: Same as Pre-Procedure Plan Follow-up Appointments: Return Appointment in 1 week. Bathing/ Shower/ Hygiene: Wash  wounds with antibacterial soap and water. May shower; gently cleanse wound with antibacterial soap, rinse and pat dry prior to dressing wounds No tub bath. Anesthetic (Use 'Patient Medications' Section for Anesthetic Order Entry): Lidocaine applied to wound bed WOUND #2: - T Great Wound Laterality: Right oe Cleanser: Soap and Water 3 x Per Week/30 Days Discharge Instructions: Gently cleanse wound with antibacterial soap, rinse and pat dry prior to dressing wounds Prim Dressing: Prisma 4.34 (in) 3 x Per Week/30 Days ary Discharge Instructions: Moisten w/normal saline or sterile water; Cover wound as directed. Do not remove from wound bed. Secondary Dressing: Coverlet Latex-Free Fabric Adhesive Dressings 3 x Per Week/30 Days Discharge Instructions: Knuckle Secondary Dressing: Foam Dressing, 4x4 (in/in) 3 x Per Week/30 Days Discharge Instructions: cut small square for padding over prisma 1. I recommend currently that the patient should continue to monitor for any signs of infection or worsening. Based on what I am seeing I do believe that we are moving in the right direction. 2. I am also can recommend the patient should continue to offload is much as he can. Right now he is working so there is always some what she can do but he is try to take it easy is much as he can. 3. Would not continue with dressings all over to use Prisma followed by a small piece of foam and then a coverlet to secure in place. He does have new shoes which is great news. These are made by [facility] accustomed to him. We will see patient back for reevaluation in 1 week here in the clinic. If anything worsens or changes patient will contact our office for additional recommendations. Electronic Signature(s) Signed: 05/19/2022 10:34:24 AM By: Allen Derry PA-C Entered By: Allen Derry on 05/19/2022 10:34:24 -------------------------------------------------------------------------------- SuperBill Details Patient  Name: Date of Service: [name] 05/19/2022 Medical Record Number: [id number] Patient Account Number: [id number] YAXIEL, MINNIE ([id number]) [phone].pdf Page 7 of 7 Date of Birth/Sex: Treating RN: [birth year] (62 y.o. [name] Provider: Tresa Res Other Clinician: Referring Provider: Treating Provider/Extender: Gary Fleet Weeks in Treatment: 3 Diagnosis Coding ICD-10 Codes Code Description E11.621 Type 2 diabetes mellitus with foot ulcer L97.512 Non-pressure chronic ulcer of other part of right foot with fat layer exposed L84 Corns and callosities N18.30 Chronic kidney disease, stage 3 unspecified I10 Essential (primary) hypertension Z89.512 Acquired absence of left leg below knee Facility Procedures : CPT4 Code: [id number] Description: 11042 - DEB SUBQ TISSUE 20 SQ CM/< ICD-10 Diagnosis Description L97.512 Non-pressure chronic ulcer of other part of right foot with fat layer exposed Modifier: Quantity: 1 Physician Procedures : CPT4 Code Description Modifier [id number] 11042 - WC PHYS SUBQ TISS 20 SQ CM ICD-10 Diagnosis Description L97.512 Non-pressure chronic ulcer of other part of right foot with fat layer exposed Quantity: 1 Electronic Signature(s) Signed: 05/19/2022 10:34:59 AM By: Allen Derry PA-C Entered By: Allen Derry on 05/19/2022 10:34:59

## 2022-05-20 NOTE — Progress Notes (Signed)
SKIPPER, DACOSTA [name] ([id number]) [id number] Page 1 of 7 Visit Report for 05/19/2022 Arrival Information Details Patient Name: Date of Service: [name], [name] 05/19/2022 9:45 A M Medical Record Number: [id number] Patient Account Number: [id number] Date of Birth/Sex: Treating RN: 13-Nov-1960 (62 y.o. [name] Primary Care Zafira Munos: Tresa Res Other Clinician: Referring Carrisa Keller: Treating Kimiyah Blick/Extender: Gary Fleet Weeks in Treatment: 3 Visit Information History Since Last Visit Added or deleted any medications: No Patient Arrived: Ambulatory Any new allergies or adverse reactions: No Arrival Time: 09:16 Had a fall or experienced change in No Accompanied By: self activities of daily living that may affect Transfer Assistance: None risk of falls: Patient Identification Verified: Yes Hospitalized since last visit: No Secondary Verification Process Completed: Yes Has Dressing in Place as Prescribed: Yes Patient Has Alerts: Yes Pain Present Now: No Patient Alerts: type 2 diabetic ABI 09/05/21 R 1.26 ABI 09/05/21 [name] 1.10 Electronic Signature(s) Signed: 05/19/2022 4:33:05 PM By: Angelina Pih Entered By: Angelina Pih on 05/19/2022 09:17:09 -------------------------------------------------------------------------------- Clinic Level of Care Assessment Details Patient Name: Date of Service: [name], [name] 05/19/2022 9:45 A M Medical Record Number: [id number] Patient Account Number: [id number] Date of Birth/Sex: Treating RN: 06-11-1960 (63 y.o. [name] Primary Care Norah Fick: Tresa Res Other Clinician: Referring Bessye Stith: Treating Kamylah Manzo/Extender: Gary Fleet Weeks in Treatment: 3 Clinic Level of Care Assessment Items TOOL 1 Quantity Score  - 0 Use when EandM and Procedure is performed on INITIAL visit ASSESSMENTS - Nursing Assessment / Reassessment  - 0 General Physical Exam  (combine w/ comprehensive assessment (listed just below) when performed on new pt. evals)  - 0 Comprehensive Assessment (HX, ROS, Risk Assessments, Wounds Hx, etc.) ASSESSMENTS - Wound and Skin Assessment / Reassessment  - 0 Dermatologic / Skin Assessment (not related to wound area) ASSESSMENTS - Ostomy and/or Continence Assessment and Care  - 0 Incontinence Assessment and Management  - 0 Ostomy Care Assessment and Management (repouching, etc.) PROCESS - Coordination of Care  - 0 Simple Patient / Family Education for ongoing care  - 0 Complex (extensive) Patient / Family Education for ongoing care  - 0 Staff obtains Chiropractor, Records, T Results / Process Orders est  - 0 Staff telephones HHA, Nursing Homes / Clarify orders / etc  - 0 Routine Transfer to another Facility (non-emergent condition)  - 0 Routine Hospital Admission (non-emergent condition)  - 0 New Admissions / Manufacturing engineer / Ordering NPWT Apligraf, etc. ,  - 0 Emergency Hospital Admission (emergent condition) PROCESS - Special Needs [name], [name] [phone].pdf Page 2 of 7  - 0 Pediatric / Minor Patient Management  - 0 Isolation Patient Management  - 0 Hearing / Language / Visual special needs  - 0 Assessment of Community assistance (transportation, D/C planning, etc.)  - 0 Additional assistance / Altered mentation  - 0 Support Surface(s) Assessment (bed, cushion, seat, etc.) INTERVENTIONS - Miscellaneous  - 0 External ear exam  - 0 Patient Transfer (multiple staff / Nurse, adult / Similar devices)  - 0 Simple Staple / Suture removal (25 or less)  - 0 Complex Staple / Suture removal (26 or more)  - 0 Hypo/Hyperglycemic Management (do not check if billed separately)  - 0 Ankle / Brachial Index (ABI) - do not check if billed separately Has the patient been seen at the hospital within the last three years:  Yes Total Score: 0 Level Of Care: ____ Electronic Signature(s) Signed: 05/19/2022 4:33:05 PM By: Angelina Pih Entered  By: Angelina Pih on 05/19/2022 09:58:44 -------------------------------------------------------------------------------- Encounter Discharge Information Details Patient Name: Date of Service: [name], [name] 05/19/2022 9:45 A M Medical Record Number: [id number] Patient Account Number: [id number] Date of Birth/Sex: Treating RN: 01-30-1961 (62 y.o. [name] Primary Care Kinslei Labine: Tresa Res Other Clinician: Referring Mei Suits: Treating Shania Bjelland/Extender: Gary Fleet Weeks in Treatment: 3 Encounter Discharge Information Items Post Procedure Vitals Discharge Condition: Stable Temperature (F): 97.8 Ambulatory Status: Ambulatory Pulse (bpm): 76 Discharge Destination: Home Respiratory Rate (breaths/min): 18 Transportation: Private Auto Blood Pressure (mmHg): 127/73 Accompanied By: spouse Schedule Follow-up Appointment: Yes Clinical Summary of Care: Electronic Signature(s) Signed: 05/19/2022 4:33:05 PM By: Angelina Pih Entered By: Angelina Pih on 05/19/2022 10:00:42 -------------------------------------------------------------------------------- Lower Extremity Assessment Details Patient Name: Date of Service: [name], [name] 05/19/2022 9:45 A M Medical Record Number: [id number] Patient Account Number: [id number] Date of Birth/Sex: Treating RN: [birth year] (62 y.o. [name] Primary Care Jenilee Franey: Tresa Res Other Clinician: Referring Emma-Lee Oddo: Treating Omarie Parcell/Extender: Gary Fleet Weeks in Treatment: 3 Edema Assessment Assessed: [Left: No] [Right: No] Edema: [Left: N] [Right: o] Calf Laatsch, [name] ([id number]) [phone].pdf Page 3 of 7 Left: Right: Point of Measurement: 41 cm From Medial Instep 37 cm Ankle Left: Right: Point of Measurement: 12 cm From  Medial Instep 22 cm Vascular Assessment Pulses: Dorsalis Pedis Palpable: [Right:Yes] Posterior Tibial Palpable: [Right:Yes] Electronic Signature(s) Signed: 05/19/2022 4:33:05 PM By: Angelina Pih Entered By: Angelina Pih on 05/19/2022 09:25:13 -------------------------------------------------------------------------------- Multi Wound Chart Details Patient Name: Date of Service: [name]. 05/19/2022 9:45 A M Medical Record Number: [id number] Patient Account Number: [id number] Date of Birth/Sex: Treating RN: [birth year] (62 y.o. [name] Primary Care Tashyra Adduci: Tresa Res Other Clinician: Referring Korryn Pancoast: Treating Brittiney Dicostanzo/Extender: Gary Fleet Weeks in Treatment: 3 Vital Signs Height(in): 77 Pulse(bpm): 76 Weight(lbs): 257 Blood Pressure(mmHg): 127/73 Body Mass Index(BMI): 30.5 Temperature(F): 97.8 Respiratory Rate(breaths/min): 18 [2:Photos:] [N/A:N/A] Right T Great oe N/A N/A Wound Location: Gradually Appeared N/A N/A Wounding Event: Diabetic Wound/Ulcer of the Lower N/A N/A Primary Etiology: Extremity Coronary Artery Disease, N/A N/A Comorbid History: Hypertension, Peripheral Venous Disease, Type II Diabetes, Neuropathy 04/04/2022 N/A N/A Date Acquired: 3 N/A N/A Weeks of Treatment: Open N/A N/A Wound Status: No N/A N/A Wound Recurrence: 0.2x0.2x0.1 N/A N/A Measurements [name] x W x D (cm) 0.031 N/A N/A A (cm) : rea 0.003 N/A N/A Volume (cm) : 67.00% N/A N/A % Reduction in A rea: 84.20% N/A N/A % Reduction in Volume: Grade 1 N/A N/A Classification: Medium N/A N/A Exudate A mount: Serosanguineous N/A N/A Exudate Type: red, brown N/A N/A Exudate Color: Large (67-100%) N/A N/A Granulation A mount: Pink N/A N/A Granulation Quality: Small (1-33%) N/A N/A Necrotic A mount: Fat Layer (Subcutaneous Tissue): Yes N/A N/A Exposed Structures: None N/A N/A Epithelialization: Armond, Nelton [name] ([id number])  [id number] Page 4 of 7 Treatment Notes Electronic Signature(s) Signed: 05/19/2022 4:33:05 PM By: Angelina Pih Entered By: Angelina Pih on 05/19/2022 09:45:58 -------------------------------------------------------------------------------- Multi-Disciplinary Care Plan Details Patient Name: Date of Service: [name]. 05/19/2022 9:45 A M Medical Record Number: [id number] Patient Account Number: [id number] Date of Birth/Sex: Treating RN: [birth year] (62 y.o. [name] Primary Care Jie Stickels: Tresa Res Other Clinician: Referring Edinson Domeier: Treating Mackinze Criado/Extender: Gary Fleet Weeks in Treatment: 3 Active Inactive Necrotic Tissue Nursing Diagnoses: Knowledge deficit related to management of necrotic/devitalized tissue Goals: Necrotic/devitalized tissue will be minimized in the wound bed Date Initiated: 05/12/2022 Target Resolution Date: 06/03/2022 Goal Status:  Active Patient/caregiver will verbalize understanding of reason and process for debridement of necrotic tissue Date Initiated: 05/12/2022 Date Inactivated: 05/12/2022 Target Resolution Date: 05/12/2022 Goal Status: Met Interventions: Provide education on necrotic tissue and debridement process Notes: Wound/Skin Impairment Nursing Diagnoses: Impaired tissue integrity Knowledge deficit related to ulceration/compromised skin integrity Goals: Ulcer/skin breakdown will have a volume reduction of 30% by week 4 Date Initiated: 04/28/2022 Target Resolution Date: 05/26/2022 Goal Status: Active Ulcer/skin breakdown will have a volume reduction of 50% by week 8 Date Initiated: 04/28/2022 Target Resolution Date: 06/23/2022 Goal Status: Active Ulcer/skin breakdown will have a volume reduction of 80% by week 12 Date Initiated: 04/28/2022 Target Resolution Date: 07/21/2022 Goal Status: Active Ulcer/skin breakdown will heal within 14 weeks Date Initiated: 04/28/2022 Target  Resolution Date: 08/04/2022 Goal Status: Active Interventions: Assess patient/caregiver ability to obtain necessary supplies Assess patient/caregiver ability to perform ulcer/skin care regimen upon admission and as needed Assess ulceration(s) every visit Provide education on ulcer and skin care Treatment Activities: Skin care regimen initiated : 04/28/2022 Notes: Electronic Signature(s) Signed: 05/19/2022 4:33:05 PM By: Stevphen Rochester, [name] ([id number]) [id number] Page 5 of 7 Entered By: Angelina Pih on 05/19/2022 09:59:35 -------------------------------------------------------------------------------- Pain Assessment Details Patient Name: Date of Service: [name], [name] 05/19/2022 9:45 A M Medical Record Number: [id number] Patient Account Number: [id number] Date of Birth/Sex: Treating RN: Aug 12, 1960 (62 y.o. [name] Primary Care Obelia Bonello: Tresa Res Other Clinician: Referring Josafat Enrico: Treating Yobani Schertzer/Extender: Gary Fleet Weeks in Treatment: 3 Active Problems Location of Pain Severity and Description of Pain Patient Has Paino No Site Locations Rate the pain. Current Pain Level: 0 Pain Management and Medication Current Pain Management: Electronic Signature(s) Signed: 05/19/2022 4:33:05 PM By: Angelina Pih Entered By: Angelina Pih on 05/19/2022 09:20:34 -------------------------------------------------------------------------------- Patient/Caregiver Education Details Patient Name: Date of Service: [name] 4/15/2024andnbsp9:45 A M Medical Record Number: [id number] Patient Account Number: [id number] Date of Birth/Gender: Treating RN: April 30, 1960 (62 y.o. [name] Primary Care Physician: Tresa Res Other Clinician: Referring Physician: Treating Physician/Extender: Sigurd Sos in Treatment: 3 Education Assessment Education Provided  To: Patient Education Topics Provided Wound Debridement: Handouts: Wound Debridement Methods: Explain/Verbal Responses: State content correctly Wound/Skin Impairment: Handouts: Caring for Your Ulcer Methods: Explain/Verbal Responses: State content correctly Pigford, Anchor [name] ([id number]) [phone].pdf Page 6 of 7 Electronic Signature(s) Signed: 05/19/2022 4:33:05 PM By: Angelina Pih Entered By: Angelina Pih on 05/19/2022 09:59:53 -------------------------------------------------------------------------------- Wound Assessment Details Patient Name: Date of Service: [name], [name] 05/19/2022 9:45 A M Medical Record Number: [id number] Patient Account Number: [id number] Date of Birth/Sex: Treating RN: [birth year] (62 y.o. [name] Primary Care Markham Dumlao: Tresa Res Other Clinician: Referring Lindsie Simar: Treating Servando Kyllonen/Extender: Gary Fleet Weeks in Treatment: 3 Wound Status Wound Number: 2 Primary Diabetic Wound/Ulcer of the Lower Extremity Etiology: Wound Location: Right T Great oe Wound Open Wounding Event: Gradually Appeared Status: Date Acquired: 04/04/2022 Comorbid Coronary Artery Disease, Hypertension, Peripheral Venous Weeks Of Treatment: 3 History: Disease, Type II Diabetes, Neuropathy Clustered Wound: No Photos Wound Measurements Length: (cm) 0.2 Width: (cm) 0.2 Depth: (cm) 0.1 Area: (cm) 0.031 Volume: (cm) 0.003 % Reduction in Area: 67% % Reduction in Volume: 84.2% Epithelialization: None Tunneling: No Undermining: No Wound Description Classification: Grade 1 Exudate Amount: Medium Exudate Type: Serosanguineous Exudate Color: red, brown Foul Odor After Cleansing: No Slough/Fibrino Yes Wound Bed Granulation Amount: Large (67-100%) Exposed Structure Granulation Quality: Pink Fat Layer (Subcutaneous Tissue) Exposed: Yes Necrotic Amount: Small (1-33%) Necrotic Quality: Adherent  Slough Treatment Notes Wound #2 (Toe Great) Wound Laterality: Right Cleanser Soap and Water Discharge Instruction: Gently cleanse wound with antibacterial soap, rinse and pat dry prior to dressing wounds Peri-Wound Care Topical Primary Dressing Prisma 4.34 (in) Discharge Instruction: Moisten w/normal saline or sterile water; Cover wound as directed. Do not remove from wound bed. [name], [name] ([id number]) [id number] Page 7 of 7 Secondary Dressing Coverlet Latex-Free Fabric Adhesive Dressings Discharge Instruction: Knuckle Foam Dressing, 4x4 (in/in) Discharge Instruction: cut small square for padding over prisma Secured With Compression Wrap Compression Stockings Add-Ons Electronic Signature(s) Signed: 05/19/2022 4:33:05 PM By: Angelina Pih Entered By: Angelina Pih on 05/19/2022 09:24:34 -------------------------------------------------------------------------------- Vitals Details Patient Name: Date of Service: [name]. 05/19/2022 9:45 A M Medical Record Number: [id number] Patient Account Number: [id number] Date of Birth/Sex: Treating RN: 1960-09-12 (62 y.o. [name] Primary Care Brettney Ficken: Tresa Res Other Clinician: Referring Chauntel Windsor: Treating Darral Rishel/Extender: Gary Fleet Weeks in Treatment: 3 Vital Signs Time Taken: 09:17 Temperature (F): 97.8 Height (in): 77 Pulse (bpm): 76 Weight (lbs): 257 Respiratory Rate (breaths/min): 18 Body Mass Index (BMI): 30.5 Blood Pressure (mmHg): 127/73 Reference Range: 80 - 120 mg / dl Electronic Signature(s) Signed: 05/19/2022 4:33:05 PM By: Angelina Pih Entered By: Angelina Pih on 05/19/2022 09:20:25

## 2022-05-26 ENCOUNTER — Encounter: Payer: PPO | Admitting: Physician Assistant

## 2022-05-26 DIAGNOSIS — L97512 Non-pressure chronic ulcer of other part of right foot with fat layer exposed: Secondary | ICD-10-CM | POA: Diagnosis not present

## 2022-05-26 DIAGNOSIS — E11621 Type 2 diabetes mellitus with foot ulcer: Secondary | ICD-10-CM | POA: Diagnosis not present

## 2022-05-26 NOTE — Progress Notes (Signed)
[name] ([id number]) [phone].pdf Page 1 of 7 Visit Report for 05/26/2022 Arrival Information Details Patient Name: Date of Service: [name], [name] 05/26/2022 9:45 A M Medical Record Number: [id number] Patient Account Number: [id number] Date of Birth/Sex: Treating RN: [birth year] (62 y.o. [name] Primary Care [name]: [name] Other Clinician: Referring [name]: Treating [name]: 4 Visit Information History Since Last Visit Added or deleted any medications: No Patient Arrived: Ambulatory Any new allergies or adverse reactions: No Arrival Time: 09:48 Had a fall or experienced change in No Accompanied By: self activities of daily living that may affect Transfer Assistance: None risk of falls: Patient Identification Verified: Yes Hospitalized since last visit: No Secondary Verification Process Completed: Yes Has Dressing in Place as Prescribed: Yes Patient Has Alerts: Yes Pain Present Now: No Patient Alerts: type 2 diabetic ABI 09/05/21 R 1.26 ABI 09/05/21 [name] 1.10 Electronic Signature(s) Signed: 05/26/2022 5:29:04 PM By: [name] Entered By: [name] on 05/26/2022 09:49:56 -------------------------------------------------------------------------------- Clinic Level of Care Assessment Details Patient Name: Date of Service: [name], [name] 05/26/2022 9:45 A M Medical Record Number: [id number] Patient Account Number: [id number] Date of Birth/Sex: Treating RN: 07/08/1960 (62 y.o. [name] Primary Care [name]: [name] Other Clinician: Referring [name]: Treating [name]: 4 Clinic Level of Care Assessment Items TOOL 1 Quantity Score  - 0 Use when EandM and Procedure is performed on INITIAL visit ASSESSMENTS - Nursing Assessment / Reassessment  - 0 General Physical Exam  (combine w/ comprehensive assessment (listed just below) when performed on new pt. evals)  - 0 Comprehensive Assessment (HX, ROS, Risk Assessments, Wounds Hx, etc.) ASSESSMENTS - Wound and Skin Assessment / Reassessment  - 0 Dermatologic / Skin Assessment (not related to wound area) ASSESSMENTS - Ostomy and/or Continence Assessment and Care  - 0 Incontinence Assessment and Management  - 0 Ostomy Care Assessment and Management (repouching, etc.) PROCESS - Coordination of Care  - 0 Simple Patient / Family Education for ongoing care  - 0 Complex (extensive) Patient / Family Education for ongoing care  - 0 Staff obtains Chiropractor, Records, T Results / Process Orders est  - 0 Staff telephones HHA, Nursing Homes / Clarify orders / etc  - 0 Routine Transfer to another Facility (non-emergent condition)  - 0 Routine Hospital Admission (non-emergent condition)  - 0 New Admissions / Manufacturing engineer / Ordering NPWT Apligraf, etc. ,  - 0 Emergency Hospital Admission (emergent condition) PROCESS - Special Needs [name] ([id number]) [id number] Page 2 of 7  - 0 Pediatric / Minor Patient Management  - 0 Isolation Patient Management  - 0 Hearing / Language / Visual special needs  - 0 Assessment of Community assistance (transportation, D/C planning, etc.)  - 0 Additional assistance / Altered mentation  - 0 Support Surface(s) Assessment (bed, cushion, seat, etc.) INTERVENTIONS - Miscellaneous  - 0 External ear exam  - 0 Patient Transfer (multiple staff / Nurse, adult / Similar devices)  - 0 Simple Staple / Suture removal (25 or less)  - 0 Complex Staple / Suture removal (26 or more)  - 0 Hypo/Hyperglycemic Management (do not check if billed separately)  - 0 Ankle / Brachial Index (ABI) - do not check if billed separately Has the patient been seen at the hospital within the last three years:  Yes Total Score: 0 Level Of Care: ____ Electronic Signature(s) Signed: 05/26/2022 5:29:04 PM By: [name] Entered  By: [name] on 05/26/2022 10:19:38 -------------------------------------------------------------------------------- Encounter Discharge Information Details Patient Name: Date of Service: [name], [name] 05/26/2022 9:45 A M Medical Record Number: [id number] Patient Account Number: [id number] Date of Birth/Sex: Treating RN: [birth year] (62 y.o. [name] Primary Care [name]: [name] Other Clinician: Referring [name]: Treating [name]: 4 Encounter Discharge Information Items Post Procedure Vitals Discharge Condition: Stable Temperature (F): 97.6 Ambulatory Status: Ambulatory Pulse (bpm): 58 Discharge Destination: Home Respiratory Rate (breaths/min): 18 Transportation: Private Auto Blood Pressure (mmHg): 149/77 Accompanied By: self Schedule Follow-up Appointment: No Clinical Summary of Care: Electronic Signature(s) Signed: 05/26/2022 10:44:23 AM By: [name] Entered By: [name] on 05/26/2022 10:44:22 -------------------------------------------------------------------------------- Lower Extremity Assessment Details Patient Name: Date of Service: [name], [name] 05/26/2022 9:45 A M Medical Record Number: [id number] Patient Account Number: [id number] Date of Birth/Sex: Treating RN: 04-09-1960 (62 y.o. [name] Primary Care [name]: [name] Other Clinician: Referring [name]: Treating [name]: 4 Edema Assessment Assessed: [Left: No] [Right: No] [Left: Edema] [Right: :] [name], [name] ([id number]) [id number] Page 3 of 7 Left: Right: Point of Measurement: 41 cm From Medial Instep 36.9 cm Ankle Left: Right: Point of Measurement: 12 cm From Medial  Instep 21.8 cm Vascular Assessment Pulses: Dorsalis Pedis Palpable: [Right:Yes] Posterior Tibial Palpable: [Right:Yes] Electronic Signature(s) Signed: 05/26/2022 5:29:04 PM By: [name] Entered By: [name] on 05/26/2022 09:57:41 -------------------------------------------------------------------------------- Multi Wound Chart Details Patient Name: Date of Service: [name]. 05/26/2022 9:45 A M Medical Record Number: [id number] Patient Account Number: [id number] Date of Birth/Sex: Treating RN: 1960-10-30 (63 y.o. [name] Primary Care Maryana Pittmon: [name] Other Clinician: Referring Benyamin Jeff: Treating Dontai Pember/Extender: [name] Weeks in [name]: 4 Vital Signs Height(in): 77 Pulse(bpm): 58 Weight(lbs): 257 Blood Pressure(mmHg): 149/77 Body Mass Index(BMI): 30.5 Temperature(F): 97.6 Respiratory Rate(breaths/min): 18 [2:Photos:] [N/A:N/A] Right T Great oe N/A N/A Wound Location: Gradually Appeared N/A N/A Wounding Event: Diabetic Wound/Ulcer of the Lower N/A N/A Primary Etiology: Extremity Coronary Artery Disease, N/A N/A Comorbid History: Hypertension, Peripheral Venous Disease, Type II Diabetes, Neuropathy 04/04/2022 N/A N/A Date Acquired: 4 N/A N/A Weeks of [name]: Open N/A N/A Wound Status: No N/A N/A Wound Recurrence: 0.2x0.3x0.2 N/A N/A Measurements [name] x W x D (cm) 0.047 N/A N/A A (cm) : rea 0.009 N/A N/A Volume (cm) : 50.00% N/A N/A % Reduction in A rea: 52.60% N/A N/A % Reduction in Volume: Grade 1 N/A N/A Classification: Medium N/A N/A Exudate A mount: Serosanguineous N/A N/A Exudate Type: red, brown N/A N/A Exudate Color: Large (67-100%) N/A N/A Granulation A mount: Pink N/A N/A Granulation Quality: Small (1-33%) N/A N/A Necrotic A mount: Fat Layer (Subcutaneous Tissue): Yes N/A N/A Exposed Structures: None N/A N/A Epithelialization: Debridement - Excisional N/A  N/A Debridement: Beeck, Gad [name] ([id number]) [id number] Page 4 of 7 10:03 N/A N/A Pre-procedure Verification/Time Out Taken: Lidocaine 4% Topical Solution N/A N/A Pain Control: Callus, Subcutaneous, Slough N/A N/A Tissue Debrided: Skin/Subcutaneous Tissue N/A N/A Level: 0.2 N/A N/A Debridement A (sq cm): rea Curette N/A N/A Instrument: Moderate N/A N/A Bleeding: Pressure N/A N/A Hemostasis A chieved: Procedure was tolerated well N/A N/A Debridement [name] Response: 0.5x0.5x0.2 N/A N/A Post Debridement Measurements [name] x W x D (cm) 0.039 N/A N/A Post Debridement Volume: (cm) Debridement N/A N/A Procedures Performed: [name] Notes Electronic Signature(s) Signed: 05/26/2022 10:41:47 AM By: [name] Entered By: [name] on 05/26/2022 10:41:47 -------------------------------------------------------------------------------- Multi-Disciplinary  Care Plan Details Patient Name: Date of Service: [name], [name] 05/26/2022 9:45 A M Medical Record Number: [id number] Patient Account Number: [id number] Date of Birth/Sex: Treating RN: 08-Jul-1960 (62 y.o. [name] Primary Care Elexia Friedt: [name] Other Clinician: Referring Valerye Kobus: Treating Kharson Rasmusson/Extender: [name] Weeks in [name]: 4 Active Inactive Necrotic Tissue Nursing Diagnoses: Knowledge deficit related to management of necrotic/devitalized tissue Goals: Necrotic/devitalized tissue will be minimized in the wound bed Date Initiated: 05/12/2022 Target Resolution Date: 06/03/2022 Goal Status: Active Patient/caregiver will verbalize understanding of reason and process for debridement of necrotic tissue Date Initiated: 05/12/2022 Date Inactivated: 05/12/2022 Target Resolution Date: 05/12/2022 Goal Status: Met Interventions: Provide education on necrotic tissue and debridement process Notes: Wound/Skin Impairment Nursing Diagnoses: Impaired  tissue integrity Knowledge deficit related to ulceration/compromised skin integrity Goals: Ulcer/skin breakdown will have a volume reduction of 30% by week 4 Date Initiated: 04/28/2022 Date Inactivated: 05/26/2022 Target Resolution Date: 05/26/2022 Goal Status: Met Ulcer/skin breakdown will have a volume reduction of 50% by week 8 Date Initiated: 04/28/2022 Target Resolution Date: 06/23/2022 Goal Status: Active Ulcer/skin breakdown will have a volume reduction of 80% by week 12 Date Initiated: 04/28/2022 Target Resolution Date: 07/21/2022 Goal Status: Active Ulcer/skin breakdown will heal within 14 weeks Date Initiated: 04/28/2022 Target Resolution Date: 08/04/2022 Goal Status: Active Interventions: DEMONTREZ, RINDFLEISCH ([id number]) [phone].pdf Page 5 of 7 Assess patient/caregiver ability to obtain necessary supplies Assess patient/caregiver ability to perform ulcer/skin care regimen upon admission and as needed Assess ulceration(s) every visit Provide education on ulcer and skin care [name] Activities: Skin care regimen initiated : 04/28/2022 Notes: Electronic Signature(s) Signed: 05/26/2022 5:29:04 PM By: [name] Entered By: [name] on 05/26/2022 10:20:00 -------------------------------------------------------------------------------- Pain Assessment Details Patient Name: Date of Service: [name] 05/26/2022 9:45 A M Medical Record Number: [id number] Patient Account Number: [id number] Date of Birth/Sex: Treating RN: 05/31/1960 (62 y.o. [name] Primary Care Emidio Warrell: [name] Other Clinician: Referring Rozlyn Yerby: Treating Tanai Bouler/Extender: [name] Weeks in [name]: 4 Active Problems Location of Pain Severity and Description of Pain Patient Has Paino No Site Locations Rate the pain. Current Pain Level: 0 Pain Management and Medication Current Pain Management: Electronic  Signature(s) Signed: 05/26/2022 5:29:04 PM By: [name] Entered By: [name] on 05/26/2022 09:53:13 -------------------------------------------------------------------------------- Patient/Caregiver Education Details Patient Name: Date of Service: [name] 4/22/2024andnbsp9:45 A M Medical Record Number: [id number] Patient Account Number: [id number] Date of Birth/Gender: Treating RN: [birth year] (62 y.o. [name] Primary Care Physician: [name] Other Clinician: Referring Physician: Treating Physician/Extender: Sigurd Sos in [name]: 4 Education Assessment Education Provided To: [name], [name] ([id number]) [phone].pdf Page 6 of 7 Patient Education Topics Provided Offloading: Handouts: How Offloading Helps Foot Wounds Heal Methods: Explain/Verbal Responses: State content correctly Wound Debridement: Handouts: Wound Debridement Methods: Explain/Verbal Responses: State content correctly Wound/Skin Impairment: Handouts: Caring for Your Ulcer Methods: Explain/Verbal Responses: State content correctly Electronic Signature(s) Signed: 05/26/2022 5:29:04 PM By: [name] Entered By: [name] on 05/26/2022 10:42:20 -------------------------------------------------------------------------------- Wound Assessment Details Patient Name: Date of Service: [name] 05/26/2022 9:45 A M Medical Record Number: [id number] Patient Account Number: [id number] Date of Birth/Sex: Treating RN: [name], 1961 (62 y.o. [name] Primary Care Beatrix Breece: [name] Other Clinician: Referring Devan Babino: Treating Tresha Muzio/Extender: [name] Weeks in [name]: 4 Wound Status Wound Number: 2 Primary Diabetic Wound/Ulcer of the Lower Extremity Etiology: Wound Location: Right T Great oe Wound Open Wounding Event: Gradually Appeared  Status: Date Acquired:  04/04/2022 Comorbid Coronary Artery Disease, Hypertension, Peripheral Venous Weeks Of [name]: 4 History: Disease, Type II Diabetes, Neuropathy Clustered Wound: No Photos Wound Measurements Length: (cm) 0.2 Width: (cm) 0.3 Depth: (cm) 0.2 Area: (cm) 0.047 Volume: (cm) 0.009 % Reduction in Area: 50% % Reduction in Volume: 52.6% Epithelialization: None Tunneling: No Undermining: No Wound Description Classification: Grade 1 Exudate Amount: Medium Exudate Type: Serosanguineous Exudate Color: red, brown Foul Odor After Cleansing: No Slough/Fibrino Yes Wound Bed Granulation Amount: Large (67-100%) Exposed Structure Granulation Quality: Pink Fat Layer (Subcutaneous Tissue) Exposed: Yes [name], [name] ([id number]) [id number] Page 7 of 7 Granulation Quality: Pink Fat Layer (Subcutaneous Tissue) Exposed: Yes Necrotic Amount: Small (1-33%) Necrotic Quality: Adherent Slough [name] Notes Wound #2 (Toe Great) Wound Laterality: Right Cleanser Soap and Water Discharge Instruction: Gently cleanse wound with antibacterial soap, rinse and pat dry prior to dressing wounds Peri-Wound Care Topical Primary Dressing Hydrofera Blue Ready Transfer Foam, 2.5x2.5 (in/in) Discharge Instruction: Apply Hydrofera Blue Ready to wound bed as directed Secondary Dressing Coverlet Latex-Free Fabric Adhesive Dressings Discharge Instruction: Knuckle Secured With Compression Wrap Compression Stockings Add-Ons Electronic Signature(s) Signed: 05/26/2022 5:29:04 PM By: [name] Entered By: [name] on 05/26/2022 09:57:19 -------------------------------------------------------------------------------- Vitals Details Patient Name: Date of Service: [name]. 05/26/2022 9:45 A M Medical Record Number: [id number] Patient Account Number: [id number] Date of Birth/Sex: Treating RN: January 19, 1961 (62 y.o. [name] Primary Care Syrus Nakama: [name] Other Clinician: Referring Shykeem Resurreccion: Treating Aldrin Engelhard/Extender: [name] Weeks in [name]: 4 Vital Signs Time Taken: 09:50 Temperature (F): 97.6 Height (in): 77 Pulse (bpm): 58 Weight (lbs): 257 Respiratory Rate (breaths/min): 18 Body Mass Index (BMI): 30.5 Blood Pressure (mmHg): 149/77 Reference Range: 80 - 120 mg / dl Electronic Signature(s) Signed: 05/26/2022 5:29:04 PM By: [name] Entered By: [name] on 05/26/2022 09:53:07

## 2022-05-26 NOTE — Progress Notes (Signed)
[name] ([id number]) [id number] Page 1 of 7 Visit Report for 05/26/2022 Chief Complaint Document Details Patient Name: Date of Service: [name], [name] 05/26/2022 9:45 A M Medical Record Number: [id number] Patient Account Number: [id number] Date of Birth/Sex: Treating RN: [birth year] (62 y.o. [name] Primary Care Provider: Tresa [name] Other Clinician: Referring Provider: Treating Provider/Extender: [name] Weeks in Treatment: 4 Information Obtained from: Patient Chief Complaint Right great callus buildup with concern for underlying ulcer Electronic Signature(s) Signed: 05/26/2022 9:42:29 AM By: Dustin Derry PA-C Entered By: [name] on 05/26/2022 09:42:28 -------------------------------------------------------------------------------- Debridement Details Patient Name: Date of Service: [name]. 05/26/2022 9:45 A M Medical Record Number: [id number] Patient Account Number: [id number] Date of Birth/Sex: Treating RN: 08-Jun-1960 (62 y.o. [name] Primary Care Provider: Tresa [name] Other Clinician: Referring Provider: Treating Provider/Extender: [name] Weeks in Treatment: 4 Debridement Performed for Assessment: Wound #2 Right T Great oe Performed By: Physician Dustin Derry, PA-C Debridement Type: Debridement Severity of Tissue Pre Debridement: Fat layer exposed Level of Consciousness (Pre-procedure): Awake and Alert Pre-procedure Verification/Time Out Yes - 10:03 Taken: Pain Control: Lidocaine 4% T opical Solution Percent of Wound Bed Debrided: 100% T Area Debrided (cm): otal 0.2 Tissue and other material debrided: Viable, Non-Viable, Callus, Slough, Subcutaneous, Slough Level: Skin/Subcutaneous Tissue Debridement Description: Excisional Instrument: Curette Bleeding: Moderate Hemostasis Achieved: Pressure Response to Treatment: Procedure was tolerated  well Level of Consciousness (Post- Awake and Alert procedure): Post Debridement Measurements of Total Wound Length: (cm) 0.5 Width: (cm) 0.5 Depth: (cm) 0.2 Volume: (cm) 0.039 Character of Wound/Ulcer Post Debridement: Stable Severity of Tissue Post Debridement: Fat layer exposed Post Procedure Diagnosis Same as Pre-procedure Electronic Signature(s) Signed: 05/26/2022 5:29:04 PM By: [name] Signed: 05/27/2022 11:50:37 AM By: Dustin Derry PA-C Entered By: [name] on 05/26/2022 10:08:34 [name] ([id number]) [id number] Page 2 of 7 -------------------------------------------------------------------------------- HPI Details Patient Name: Date of Service: [name], [name] 05/26/2022 9:45 A M Medical Record Number: [id number] Patient Account Number: [id number] Date of Birth/Sex: Treating RN: 07-May-1960 (62 y.o. [name] Primary Care Provider: Tresa [name] Other Clinician: Referring Provider: Treating Provider/Extender: [name] Weeks in Treatment: 4 History of Present Illness HPI Description: 11-07-2021 upon evaluation today patient appears to be doing somewhat poorly in regard to a wound on his right plantar great toe. Fortunately there does not appear to be any evidence of active infection locally or systemically at this time which is great news. No fevers, chills, nausea, vomiting, or diarrhea. With that being said he tells me that he really has not been noticing a lot of improvement either with regard to the treatment. He has been under the care of [name] up to this point for roughly a year. More recently he did have an MRI which revealed the potential for possible osteomyelitis. With that being said I do believe that he definitely should probably be monitored for this but to be honest the [name] that he was given probably did a good job. Considering there was not much being seen on MRI I do not  think there is a high likelihood of severe osteomyelitis at this point we will definitely keep an eye on things however. Nonetheless right now has been utilizing Silvadene cream I definitely think we can find something better to help get this moving in the right direction. Patient does have a history of diabetes mellitus type 2, chronic kidney disease stage III, hypertension, and again he  has had an amputation of the left leg currently. The right leg is the 1 that we are actually treating the great toe wound on. He has a below-knee amputation. 11-11-2021 upon evaluation today patient's wound actually is showing signs of excellent improvement. I am actually very pleased even compared to last week this is already better. He is very happy as well. 12-02-2021 upon evaluation today patient appears to be doing pretty well in regard to his toe ulcer this been several weeks since I saw him part of it was our scheduling with me being out of town and part of it was he actually missed an appointment secondary to his trunk the will actually fell off and he had some issues there getting it fixed on Monday so he missed his appointment last Monday. Nonetheless the good news is in today things appear to be doing well no signs of infection though he does have a pretty good need for sharp debridement to try to clear some of this away. 11/6; right first toes ulcer complicated by his left-sided BKA.Marland KitchenNo major change from last visit 12-16-2021 upon evaluation today patient appears to be doing well currently in regard to his toe ulcer that was not making a good amount of improvement yet I do believe the total contact cast is really good to be necessary. I talked about that before he tells me his wife can drive him around he is done with working now also he can actually look towards doing this. 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 12-30-2021 upon evaluation today patient appears to be doing well currently in regard to his foot ulcer on the toe although he is still building up quite a bit of callus. Will actually get the total contact cast on him today and started which I think is good to be very beneficial. 01-02-2022 upon evaluation today patient appears to be doing much better in regard to his wound. The cast does seem to do excellent for him. Fortunately there does not appear to be any evidence of active infections at this time. 01-09-2022 upon evaluation today patient appears to be doing excellent in regard to his wound. He is actually showing signs of excellent improvement and in general I think were very close to complete resolution. In fact there might be just a really tiny area, 0.1, still open but again this is much better than where we were prior to put the cast on. Its only been a little over a week since we put the first cast in place. 01-17-2022 upon 11 valuation today patient appears to be doing well currently in regard to his toe ulcer. In fact he appears to be completely healed based on what I am seeing at this point. I do not see any evidence of anything open. Readmission: 04-28-2022 upon evaluation today patient presents for reevaluation here in the clinic I actually saw him last January 17, 2022. At that point he was completely healed. With that being said unfortunately he notes that he has been having some callus buildup in regard to the toe and he figured he should come in and get this checked out sooner rather than later. This is the medial aspect of the right great toe again he has a below-knee amputation on the left. Fortunately I do not see any signs of infection there is a lot of  callus that was not definitively open when I first saw this but we did perform debridement to clear this away that we detailed in the objective section. 05-05-2022 upon evaluation today patient appears  to be doing well currently in regard to his wound. I do not think this is getting much worse he still has some callus buildup but he is supposed to be getting his new diabetic shoes shortly which I think is can help a lot with this. Fortunately I do not see any signs of active infection locally or systemically which is great news. No fevers, chills, nausea, vomiting, or diarrhea. 05-12-2022 upon evaluation today patient appears to be doing well with regard to his toe ulcer. This is actually showing signs of improvement which is great news. Fortunately I do not see any signs of active infection locally nor systemically at this time which is great news. No fevers, chills, nausea, vomiting, or diarrhea. 05-19-2022 upon evaluation today patient appears to be doing well currently in regard to his wound although it is really not much better it is about the same. Fortunately I do not see any signs of active infection locally or systemically which is great news. No fevers, chills, nausea, vomiting, or diarrhea. 05-26-2022 upon evaluation today patient appears to be doing poorly in regard to his toe ulcer this is still not doing quite as well as what we like to see. Fortunately I do not see any signs of active infection locally or systemically which is good news. With that being said he does have evidence of callus buildup and the wound is still open underneath I think this is going to probably end up being a little bit deeper in fact compared to what it was previous. Electronic Signature(s) Signed: 05/26/2022 10:16:09 AM By: Dustin Derry PA-C Entered By: [name] on 05/26/2022 10:16:08 Physical Exam Details -------------------------------------------------------------------------------- Nira Conn ([id number]) [id number] Page 3 of 7 Patient Name: Date of Service: [name], [name] 05/26/2022 9:45 A M Medical Record Number: [id number] Patient Account Number: [id number] Date  of Birth/Sex: Treating RN: September 18, 1960 (62 y.o. [name] Primary Care Provider: Tresa [name] Other Clinician: Referring Provider: Treating Provider/Extender: [name] Weeks in Treatment: 4 Constitutional Well-nourished and well-hydrated in no acute distress. Respiratory normal breathing without difficulty. Psychiatric this patient is able to make decisions and demonstrates good insight into disease process. Alert and Oriented x 3. pleasant and cooperative. Notes Upon inspection patient's wound actually showed signs of quite a bit of callus. I did perform debridement to clear this callus away postdebridement the wound bed appears to be doing much better which is great news. Fortunately I do not see any evidence of active infection locally nor systemically which is also great news. Electronic Signature(s) Signed: 05/26/2022 10:16:46 AM By: Dustin Derry PA-C Entered By: [name] on 05/26/2022 10:16:46 -------------------------------------------------------------------------------- Physician Orders Details Patient Name: Date of Service: [name] 05/26/2022 9:45 A M Medical Record Number: [id number] Patient Account Number: [id number] Date of Birth/Sex: Treating RN: Jul 03, 1960 (62 y.o. [name] Primary Care Provider: Tresa [name] Other Clinician: Referring Provider: Treating Provider/Extender: [name] Weeks in Treatment: 4 Verbal / Phone Orders: No Diagnosis Coding ICD-10 Coding Code Description E11.621 Type 2 diabetes mellitus with foot ulcer L97.512 Non-pressure chronic ulcer of other part of right foot with fat layer exposed L84 Corns and callosities N18.30 Chronic kidney disease, stage 3 unspecified I10 Essential (primary) hypertension Z89.512 Acquired absence of left leg below knee Follow-up  Appointments Return Appointment in 1 week. Bathing/ [employer] wounds with antibacterial soap and  water. May shower; gently cleanse wound with antibacterial soap, rinse and pat dry prior to dressing wounds No tub bath. Anesthetic (Use 'Patient Medications' Section for Anesthetic Order Entry) Lidocaine applied to wound bed Off-Loading Offloading felt to foot. - off loading to front Wound Treatment Wound #2 - T Great oe Wound Laterality: Right Cleanser: Soap and Water 3 x Per Week/30 Days Discharge Instructions: Gently cleanse wound with antibacterial soap, rinse and pat dry prior to dressing wounds Prim Dressing: Hydrofera Blue Ready Transfer Foam, 2.5x2.5 (in/in) 3 x Per Week/30 Days ary Discharge Instructions: Apply Hydrofera Blue Ready to wound bed as directed Secondary Dressing: Coverlet Latex-Free Fabric Adhesive Dressings 3 x Per Week/30 Days Discharge Instructions: [name], [name] ([id number]) [id number] Page 4 of 7 Electronic Signature(s) Signed: 05/26/2022 5:29:04 PM By: [name] Signed: 05/27/2022 11:50:37 AM By: Dustin Derry PA-C Entered By: [name] on 05/26/2022 10:19:28 -------------------------------------------------------------------------------- Problem List Details Patient Name: Date of Service: [name] 05/26/2022 9:45 A M Medical Record Number: [id number] Patient Account Number: [id number] Date of Birth/Sex: Treating RN: 1960-08-05 (62 y.o. [name] Primary Care Provider: Tresa [name] Other Clinician: Referring Provider: Treating Provider/Extender: [name] Weeks in Treatment: 4 Active Problems ICD-10 Encounter Code Description Active Date MDM Diagnosis E11.621 Type 2 diabetes mellitus with foot ulcer 04/28/2022 No Yes L97.512 Non-pressure chronic ulcer of other part of right foot with fat layer exposed 04/28/2022 No Yes L84 Corns and callosities 04/28/2022 No Yes N18.30 Chronic kidney disease, stage 3 unspecified 04/28/2022 No Yes I10 Essential (primary)  hypertension 04/28/2022 No Yes Z89.512 Acquired absence of left leg below knee 04/28/2022 No Yes Inactive Problems Resolved Problems Electronic Signature(s) Signed: 05/26/2022 9:42:24 AM By: Dustin Derry PA-C Entered By: [name] on 05/26/2022 09:42:23 -------------------------------------------------------------------------------- Progress Note Details Patient Name: Date of Service: [name] 05/26/2022 9:45 A M Medical Record Number: [id number] Patient Account Number: [id number] Date of Birth/Sex: Treating RN: [birth year] (62 y.o. [name] Primary Care Provider: Tresa [name] Other Clinician: Referring Provider: Treating Provider/Extender: [name] Weeks in Treatment: 4 Subjective Chief Complaint [name], [name] ([id number]) [id number] Page 5 of 7 Information obtained from Patient Right great callus buildup with concern for underlying ulcer History of Present Illness (HPI) 11-07-2021 upon evaluation today patient appears to be doing somewhat poorly in regard to a wound on his right plantar great toe. Fortunately there does not appear to be any evidence of active infection locally or systemically at this time which is great news. No fevers, chills, nausea, vomiting, or diarrhea. With that being said he tells me that he really has not been noticing a lot of improvement either with regard to the treatment. He has been under the care of [name] up to this point for roughly a year. More recently he did have an MRI which revealed the potential for possible osteomyelitis. With that being said I do believe that he definitely should probably be monitored for this but to be honest the [name] that he was given probably did a good job. Considering there was not much being seen on MRI I do not think there is a high likelihood of severe osteomyelitis at this point we will definitely keep an eye on things however. Nonetheless right  now has been utilizing Silvadene cream I definitely think we can find something better to help get this moving in the right  direction. Patient does have a history of diabetes mellitus type 2, chronic kidney disease stage III, hypertension, and again he has had an amputation of the left leg currently. The right leg is the 1 that we are actually treating the great toe wound on. He has a below-knee amputation. 11-11-2021 upon evaluation today patient's wound actually is showing signs of excellent improvement. I am actually very pleased even compared to last week this is already better. He is very happy as well. 12-02-2021 upon evaluation today patient appears to be doing pretty well in regard to his toe ulcer this been several weeks since I saw him part of it was our scheduling with me being out of town and part of it was he actually missed an appointment secondary to his trunk the will actually fell off and he had some issues there getting it fixed on Monday so he missed his appointment last Monday. Nonetheless the good news is in today things appear to be doing well no signs of infection though he does have a pretty good need for sharp debridement to try to clear some of this away. 11/6; right first toes ulcer complicated by his left-sided BKA.Marland KitchenNo major change from last visit 12-16-2021 upon evaluation today patient appears to be doing well currently in regard to his toe ulcer that was not making a good amount of improvement yet I do believe the total contact cast is really good to be necessary. I talked about that before he tells me his wife can drive him around he is done with working now also he can actually look towards doing this. 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 12-30-2021 upon evaluation today patient appears to be doing well currently in regard to his foot ulcer on the toe  although he is still building up quite a bit of callus. Will actually get the total contact cast on him today and started which I think is good to be very beneficial. 01-02-2022 upon evaluation today patient appears to be doing much better in regard to his wound. The cast does seem to do excellent for him. Fortunately there does not appear to be any evidence of active infections at this time. 01-09-2022 upon evaluation today patient appears to be doing excellent in regard to his wound. He is actually showing signs of excellent improvement and in general I think were very close to complete resolution. In fact there might be just a really tiny area, 0.1, still open but again this is much better than where we were prior to put the cast on. Its only been a little over a week since we put the first cast in place. 01-17-2022 upon 11 valuation today patient appears to be doing well currently in regard to his toe ulcer. In fact he appears to be completely healed based on what I am seeing at this point. I do not see any evidence of anything open. Readmission: 04-28-2022 upon evaluation today patient presents for reevaluation here in the clinic I actually saw him last January 17, 2022. At that point he was completely healed. With that being said unfortunately he notes that he has been having some callus buildup in regard to the toe and he figured he should come in and get this checked out sooner rather than later. This is the medial aspect of the right great toe again he has  a below-knee amputation on the left. Fortunately I do not see any signs of infection there is a lot of callus that was not definitively open when I first saw this but we did perform debridement to clear this away that we detailed in the objective section. 05-05-2022 upon evaluation today patient appears to be doing well currently in regard to his wound. I do not think this is getting much worse he still has some callus buildup but he is  supposed to be getting his new diabetic shoes shortly which I think is can help a lot with this. Fortunately I do not see any signs of active infection locally or systemically which is great news. No fevers, chills, nausea, vomiting, or diarrhea. 05-12-2022 upon evaluation today patient appears to be doing well with regard to his toe ulcer. This is actually showing signs of improvement which is great news. Fortunately I do not see any signs of active infection locally nor systemically at this time which is great news. No fevers, chills, nausea, vomiting, or diarrhea. 05-19-2022 upon evaluation today patient appears to be doing well currently in regard to his wound although it is really not much better it is about the same. Fortunately I do not see any signs of active infection locally or systemically which is great news. No fevers, chills, nausea, vomiting, or diarrhea. 05-26-2022 upon evaluation today patient appears to be doing poorly in regard to his toe ulcer this is still not doing quite as well as what we like to see. Fortunately I do not see any signs of active infection locally or systemically which is good news. With that being said he does have evidence of callus buildup and the wound is still open underneath I think this is going to probably end up being a little bit deeper in fact compared to what it was previous. Objective Constitutional Well-nourished and well-hydrated in no acute distress. Vitals Time Taken: 9:50 AM, Height: 77 in, Weight: 257 lbs, BMI: 30.5, Temperature: 97.6 F, Pulse: 58 bpm, Respiratory Rate: 18 breaths/min, Blood Pressure: 149/77 mmHg. Respiratory normal breathing without difficulty. Psychiatric this patient is able to make decisions and demonstrates good insight into disease process. Alert and Oriented x 3. pleasant and cooperative. [name], [name] ([id number]) [id number] Page 6 of 7 General Notes: Upon inspection patient's wound  actually showed signs of quite a bit of callus. I did perform debridement to clear this callus away postdebridement the wound bed appears to be doing much better which is great news. Fortunately I do not see any evidence of active infection locally nor systemically which is also great news. Integumentary (Hair, Skin) Wound #2 status is Open. Original cause of wound was Gradually Appeared. The date acquired was: 04/04/2022. The wound has been in treatment 4 weeks. The wound is located on the Right T Great. The wound measures 0.2cm length x 0.3cm width x 0.2cm depth; 0.047cm^2 area and 0.009cm^3 volume. There is Fat oe Layer (Subcutaneous Tissue) exposed. There is no tunneling or undermining noted. There is a medium amount of serosanguineous drainage noted. There is large (67-100%) pink granulation within the wound bed. There is a small (1-33%) amount of necrotic tissue within the wound bed including Adherent Slough. Assessment Active Problems ICD-10 Type 2 diabetes mellitus with foot ulcer Non-pressure chronic ulcer of other part of right foot with fat layer exposed Corns and callosities Chronic kidney disease, stage 3 unspecified Essential (primary) hypertension Acquired absence of left leg below knee Procedures Wound #2 Pre-procedure diagnosis of  Wound #2 is a Diabetic Wound/Ulcer of the Lower Extremity located on the Right T Great .Severity of Tissue Pre Debridement is: oe Fat layer exposed. There was a Excisional Skin/Subcutaneous Tissue Debridement with a total area of 0.2 sq cm performed by Dustin Derry, PA-C. With the following instrument(s): Curette to remove Viable and Non-Viable tissue/material. Material removed includes Callus, Subcutaneous Tissue, and Slough after achieving pain control using Lidocaine 4% Topical Solution. No specimens were taken. A time out was conducted at 10:03, prior to the start of the procedure. A Moderate amount of bleeding was controlled with Pressure. The  procedure was tolerated well. Post Debridement Measurements: 0.5cm length x 0.5cm width x 0.2cm depth; 0.039cm^3 volume. Character of Wound/Ulcer Post Debridement is stable. Severity of Tissue Post Debridement is: Fat layer exposed. Post procedure Diagnosis Wound #2: Same as Pre-Procedure Plan 1. I would recommend currently that we have the patient continue to monitor for any signs of infection or worsening. Based on what I am seeing I do believe that he is making somewhat slow progress I really think we got a step up again in order to get this closed as quickly as possible. 2. I am also can recommend that the patient should continue to monitor for any signs of infection or worsening. Obviously based on what I am seeing if anything changes he can contact the office and let me know. 3. If it comes down to him still not seeing improvement come next week with the dressing change as well as the change in footwear to try to offload with the front offloading shoe then I think the neck step would be to go back into the total contact cast which she did well with previous. 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 We will see patient back for reevaluation in 1 week here in the clinic. If anything worsens or changes patient will contact our office for additional recommendations. Electronic Signature(s) Signed: 05/26/2022 10:17:46 AM By: Dustin Derry PA-C Entered By: [name] on 05/26/2022 10:17:45 -------------------------------------------------------------------------------- SuperBill Details Patient Name: Date of Service: [name] 05/26/2022 Medical Record Number: [id number] Patient Account Number: [id number] Date of Birth/Sex: Treating RN: 1960/12/20 (62 y.o. [name] Primary Care Provider: Tresa [name] Other Clinician: Referring Provider: Treating Provider/Extender: [name] Weeks in Treatment: 4 Diagnosis  Coding ICD-10 Codes TREVELLE, MCGURN ([id number]) [id number] Page 7 of 7 Code Description E11.621 Type 2 diabetes mellitus with foot ulcer L97.512 Non-pressure chronic ulcer of other part of right foot with fat layer exposed L84 Corns and callosities N18.30 Chronic kidney disease, stage 3 unspecified I10 Essential (primary) hypertension Z89.512 Acquired absence of left leg below knee Facility Procedures : CPT4 Code: 78295621 Description: 11042 - DEB SUBQ TISSUE 20 SQ CM/< ICD-10 Diagnosis Description L97.512 Non-pressure chronic ulcer of other part of right foot with fat layer exposed Modifier: Quantity: 1 Physician Procedures : CPT4 Code Description Modifier [id number] 11042 - WC PHYS SUBQ TISS 20 SQ CM ICD-10 Diagnosis Description L97.512 Non-pressure chronic ulcer of other part of right foot with fat layer exposed Quantity: 1 Electronic Signature(s) Signed: 05/26/2022 5:29:04 PM By: [name] Signed: 05/27/2022 11:50:37 AM By: Dustin Derry PA-C Previous Signature: 05/26/2022 10:17:58 AM Version By: Dustin Derry PA-C Entered By: [name] on 05/26/2022 10:19:48

## 2022-05-30 ENCOUNTER — Encounter: Payer: Self-pay | Admitting: Nurse Practitioner

## 2022-05-30 ENCOUNTER — Ambulatory Visit: Payer: PPO | Attending: Nurse Practitioner | Admitting: Nurse Practitioner

## 2022-05-30 VITALS — BP 128/88 | HR 56 | Ht 77.0 in | Wt 279.2 lb

## 2022-05-30 DIAGNOSIS — N184 Chronic kidney disease, stage 4 (severe): Secondary | ICD-10-CM | POA: Diagnosis not present

## 2022-05-30 DIAGNOSIS — R0789 Other chest pain: Secondary | ICD-10-CM | POA: Diagnosis not present

## 2022-05-30 DIAGNOSIS — E785 Hyperlipidemia, unspecified: Secondary | ICD-10-CM | POA: Diagnosis not present

## 2022-05-30 DIAGNOSIS — R0683 Snoring: Secondary | ICD-10-CM | POA: Diagnosis not present

## 2022-05-30 DIAGNOSIS — I10 Essential (primary) hypertension: Secondary | ICD-10-CM | POA: Diagnosis not present

## 2022-05-30 DIAGNOSIS — Z79899 Other long term (current) drug therapy: Secondary | ICD-10-CM

## 2022-05-30 NOTE — Patient Instructions (Addendum)
Medication Instructions:  Your physician recommends that you continue on your current medications as directed. Please refer to the Current Medication list given to you today.   Labwork: Lipid, LFT due BEFORE NEXT APPOINTMENT @ [facility]  Testing/Procedures: none  Follow-Up:  Your physician recommends that you schedule a follow-up appointment in: 6 months  Any Other Special Instructions Will Be Listed Below (If Applicable).  You have been referred to Pulmonology  If you need a refill on your cardiac medications before your next appointment, please call your pharmacy.

## 2022-05-30 NOTE — Progress Notes (Signed)
Office Visit    Patient Name: [name] Date of Encounter: 05/30/2022  PCP:  Golden Pop, FNP   [facility]  Cardiologist:  Dina Rich, MD  Advanced Practice Provider:  Sharlene Dory, NP Electrophysiologist:  None   Chief Complaint    [name] is a very pleasant 62 y.o. male with a hx of chest pain, hyperlipidemia, hypertension, type 2 diabetes, history of TIA, bradycardia, CKD stage IV, who presents today for follow-up.   Past Medical History    Past Medical History:  Diagnosis Date   Allergic rhinitis, cause unspecified    Anginal pain (HCC)    Arthritis    BPH (benign prostatic hyperplasia)    Cervicalgia    Chest pain Sept. 2005   multiple caths  /  cath 06/15/2010..normal coronaries,  EF 60%   (false postive nuclear in the past)   CKD (chronic kidney disease), stage III (HCC)    Complication of anesthesia    Hard time waking up patient stated low blood pressures around 2017   Dyslipidemia    mixed   Esophageal reflux    Fibromyalgia    Gastroparesis    Headache(784.0)    History of kidney stones    Hyperlipidemia    Hypertension    Hypoplasia of one kidney    IDDM (insulin dependent diabetes mellitus)    Lumbago    Neuropathy associated with endocrine disorder (HCC)    Overweight(278.02)    Peripheral vascular disease (HCC)    Pulmonary embolus (HCC)    Hx of small left lower lobe  pulmonary embolus   Pulmonary embolus (HCC) 2010ish   Renal insufficiency    Stroke [facility]    mini stroke aug 2016   Type II or unspecified type diabetes mellitus with neurological manifestations, not stated as uncontrolled(250.60)    Urticaria, unspecified    Wears glasses    Past Surgical History:  Procedure Laterality Date   ACHILLES TENDON REPAIR Left 2013   AMPUTATION Left 06/02/2013   Procedure: AMPUTATION 1ST TOE LEFT FOOT;  Surgeon: Dallas Schimke, DPM;  Location: [facility];  Service: Orthopedics;  Laterality:  Left;   AMPUTATION Left 07/21/2013   Procedure: PARTIAL AMPUTATION 2ND TOE LEFT FOOT;  Surgeon: Dallas Schimke, DPM;  Location: [facility];  Service: Podiatry;  Laterality: Left;   AMPUTATION Left 01/12/2019   Procedure: LEFT BELOW KNEE AMPUTATION;  Surgeon: Nadara Mustard, MD;  Location: [facility] OR;  Service: Orthopedics;  Laterality: Left;   ANTERIOR CERVICAL DECOMP/DISCECTOMY FUSION N/A 12/29/2016   Procedure: Cervical five-six Cervical six-seven Anterior cervical discectomy with fusion and plate fixation;  Surgeon: Ditty, Loura Halt, MD;  Location: [facility] OR;  Service: Neurosurgery;  Laterality: N/A;   APPENDECTOMY     BACK SURGERY  1999   x 6 some fusions   BELOW KNEE LEG AMPUTATION Left 01/2019   BIOPSY  12/18/2017   Procedure: BIOPSY;  Surgeon: Malissa Hippo, MD;  Location: [facility];  Service: Endoscopy;;  gastric and esophagus   colonscopy     ESOPHAGOGASTRODUODENOSCOPY (EGD) WITH PROPOFOL N/A 12/18/2017   Procedure: ESOPHAGOGASTRODUODENOSCOPY (EGD) WITH PROPOFOL;  Surgeon: Malissa Hippo, MD;  Location: [facility];  Service: Endoscopy;  Laterality: N/A;  9:25   FOOT ARTHRODESIS Right 01/11/2014   Procedure: ARTHRODESIS INTERPHALANGEAL JOINT HALLUX RIGHT FOOT;  Surgeon: Dallas Schimke, DPM;  Location: [facility];  Service: Podiatry;  Laterality: Right;   HARDWARE REMOVAL Right 01/17/2015  Procedure: HARDWARE REMOVAL;  Surgeon: Ferman Hamming, DPM;  Location: [facility];  Service: Podiatry;  Laterality: Right;   I & D EXTREMITY Left 12/22/2018   Procedure: LEFT PARTIAL CALCANEAL EXCISION;  Surgeon: Nadara Mustard, MD;  Location: [facility] OR;  Service: Orthopedics;  Laterality: Left;   KNEE ARTHROSCOPY Right 10/2015   LUMBAR LAMINECTOMY/DECOMPRESSION MICRODISCECTOMY Right 01/12/2013   Procedure: Right Lumbar Three-Four Laminotomy/Foraminotomy;  Surgeon: Karn Cassis, MD;  Location: [facility];  Service: Neurosurgery;  Laterality: Right;  Right Lumbar Three-Four  Laminotomy/Foraminotomy   NISSEN FUNDOPLICATION     SHOULDER ARTHROSCOPY W/ ROTATOR CUFF REPAIR Right    SHOULDER ARTHROSCOPY WITH DISTAL CLAVICLE RESECTION Right 10/04/2018   Procedure: RIGHT SHOULDER ARTHROSCOPY WITH DISTAL CLAVICLE RESECTION, EXTENSIVE DEBRIDEMENT, SUBACROMIAL PARTIAL ACROMIOPLASTY,;  Surgeon: Frederico Hamman, MD;  Location: [facility];  Service: Orthopedics;  Laterality: Right;  PRE/POST OP SCALENE    Allergies  Allergies  Allergen Reactions   Bee Venom Swelling   Reglan [Metoclopramide] Itching and Rash    History of Present Illness    [name] is a 62 y.o. male with a past medical history as mentioned above.  Last seen by Dr. Dina Rich on September 16, 2021.  Longstanding history of chest pain, history of benign caths.  NST in 2022 was normal, low risk.  Treated for possible microvascular disease with Ranexa, no longer was on Imdur.  Dr. Wyline Mood recommended taking Ranexa 1000 mg twice daily.    Last saw this patient on 04/02/2022 for follow-up. Admitted to palpitations, noticed about 1-2 times per day, usually noted at night, lasted about 15 minutes, then resolved. Denied any chest or other associated symptoms.  Denied any alcohol use, tobacco use, energy use. Did report drinking coffee occasionally.  Reported getting 2-1/2 hours of sleep each night for years.  STOP-BANG score was 6. Arranged monitor - report below.   Today he presents for follow-up. Overall doing well.  Had recent episode of his chest hurting while he was riding a lawnmower,-along mid chest, no radiation, 2 out of 10 in intensity, not bothersome per his report, attributes this to how he was staring the wheel on the lawnmower.  Denies any recurrence in chest pain. Now getting full amount of sleep since starting Melatonin. Denies any shortness of breath, palpitations, syncope, presyncope, dizziness, orthopnea, PND, swelling or significant weight changes, acute bleeding, or  claudication.  EKGs/Labs/Other Studies Reviewed:   The following studies were reviewed today:   EKG:  EKG not ordered today.    Cardiac monitor 04/2022:   7 day monitor   Rare supraventricular ectopy in the form of isolated PACs, couplets. 1 run of SVT lasting 5 beats.   Ventricular ectopy in the form of isolated PVCs   No symptoms reported     Patch Wear Time:  6 days and 19 hours (2024-02-28T11:49:38-0500 to 2024-03-06T07:41:26-0500)   Patient had a min HR of 45 bpm, max HR of 124 bpm, and avg HR of 60 bpm. Predominant underlying rhythm was Sinus Rhythm. First Degree AV Block was present. 1 run of Supraventricular Tachycardia occurred lasting 5 beats with a max rate of 124 bpm (avg 117  bpm). Isolated SVEs were rare (<1.0%), SVE Couplets were rare (<1.0%), and no SVE Triplets were present. Isolated VEs were rare (<1.0%), and no VE Couplets or VE Triplets were present.  Cardiac monitor on 03/17/2021: 2 day monitor Rare supraventricular ectopy in the form of isolated PACs, couplets, triplets. 3 runs of SVT  longest 8 beats Rare ventricular ectopy in the form of isolated PVCs No symptoms reported     Patch Wear Time:  1 days and 23 hours (2023-02-02T08:45:31-0500 to 2023-02-04T08:20:16-0500)   Patient had a min HR of 47 bpm, max HR of 115 bpm, and avg HR of 63 bpm. Predominant underlying rhythm was Sinus Rhythm. First Degree AV Block was present. 3 Supraventricular Tachycardia runs occurred, the run with the fastest interval lasting 8 beats  with a max rate of 113 bpm (avg 105 bpm); the run with the fastest interval was also the longest. Isolated SVEs were rare (<1.0%), SVE Couplets were rare (<1.0%), and SVE Triplets were rare (<1.0%). Isolated VEs were rare (<1.0%), and no VE Couplets or  VE Triplets were present.  Myoview on 04/10/2020: There was no ST segment deviation noted during stress. The study is normal. Inferior defect most consistent with subdiaphragmatic attenuation. Cannot  completely exclude prior mild inferior infarct. Either finding would support low risk. This is a low risk study. The left ventricular ejection fraction is normal (55-65%).  Echo on 03/14/2017: Study Conclusions   - Left ventricle: Inferobasal hypokinesis The cavity size was    normal. There was mild focal basal hypertrophy of the septum.    Systolic function was normal. The estimated ejection fraction was    in the range of 55% to 60%. Wall motion was normal; there were no    regional wall motion abnormalities. Doppler parameters are    consistent with abnormal left ventricular relaxation (grade 1    diastolic dysfunction).  - Mitral valve: Valve area by pressure half-time: 1.55 cm^2.  - Left atrium: The atrium was mildly dilated.   Recent Labs: 09/05/2021: BUN 34; Creat 2.37; Hemoglobin 15.0; Platelets 157; Potassium 4.5; Sodium 139  Recent Lipid Panel    Component Value Date/Time   CHOL 151 03/14/2017 1658   TRIG 111 03/14/2017 1658   HDL 33 (L) 03/14/2017 1658   CHOLHDL 4.6 03/14/2017 1658   VLDL 22 03/14/2017 1658   LDLCALC 96 03/14/2017 1658    Home Medications   Current Meds  Medication Sig   aspirin EC 81 MG tablet Take 1 tablet (81 mg total) by mouth daily. Swallow whole.   atorvastatin (LIPITOR) 40 MG tablet TAKE 1 TABLET BY MOUTH EVERY DAY   calcitRIOL (ROCALTROL) 0.25 MCG capsule Take 0.25 mcg by mouth 3 (three) times daily. 3 times weekly (M-W-F)   carvedilol (COREG) 6.25 MG tablet Take 6.25 mg by mouth 2 (two) times daily.   EPINEPHrine 0.3 mg/0.3 mL IJ SOAJ injection Inject 0.3 mg into the muscle as needed for anaphylaxis.   gabapentin (NEURONTIN) 600 MG tablet 1 tab by mouth every morning & 3 tabs at bedtime   glipiZIDE (GLUCOTROL) 10 MG tablet Take 20 mg by mouth daily.   insulin detemir (LEVEMIR) 100 UNIT/ML injection Inject 55 Units into the skin at bedtime.   Lancets (ONETOUCH DELICA PLUS LANCET33G) MISC daily.   NOVOLOG FLEXPEN 100 UNIT/ML FlexPen Inject  into the skin as directed. Per sliding scale   ONETOUCH ULTRA test strip USE TO TEST BLOOD SUGAR THREE TIMES DAILY   ranolazine (RANEXA) 1000 MG SR tablet TAKE 1 TABLET BY MOUTH TWICE DAILY   sodium bicarbonate 650 MG tablet Take 650 mg by mouth 2 (two) times daily.      Review of Systems    All other systems reviewed and are otherwise negative except as noted above.  Physical Exam    VS:  BP  128/88 (BP Location: Left Arm, Patient Position: Sitting, Cuff Size: Large)   Pulse (!) 56   Ht 6\' 5"  (1.956 m)   Wt 279 lb 3.2 oz (126.6 kg)   SpO2 97%   BMI 33.11 kg/m  , BMI Body mass index is 33.11 kg/m.  Wt Readings from Last 3 Encounters:  05/30/22 279 lb 3.2 oz (126.6 kg)  04/02/22 262 lb (118.8 kg)  09/16/21 260 lb (117.9 kg)     GEN: Obese, 62 y.o. male in no acute distress. HEENT: normal. Neck: Supple, no JVD, carotid bruits, or masses. Cardiac: S1/S2, RRR, no murmurs, rubs, or gallops. No clubbing, cyanosis, edema.  Radials/ Right PT 2+.  Respiratory:  Respirations regular and unlabored, clear to auscultation bilaterally. GI: Soft, nontender, nondistended. MS: L BKA, no swelling. Skin: Warm and dry, no rash. Neuro:  Strength and sensation are intact. Psych: Normal affect.  Assessment & Plan     Atypical chest pain Limited one-time episode of chest pain while riding lawnmower, attributes this to how he was steering steering wheel.  Denies any recurrence since.  Will continue to monitor for now.  No indication for ischemic evaluation at this time.  Continue current medication regimen.  ED precautions discussed. Heart healthy diet and regular cardiovascular exercise encouraged.    HTN BP stable. Discussed to monitor BP at home at least 2 hours after medications and sitting for 5-10 minutes. Continue current medication regimen. Heart healthy diet and regular cardiovascular exercise encouraged.   HLD, medication management LDL 10/2021 was 113. Continue atorvastatin.   Discussed/encouraged Mediterranean diet. Will obtain repeat labs (FLP and LFT) prior to next OV to see if atorvastatin dosage needs to be increased. Heart healthy diet and regular cardiovascular exercise encouraged. Continue to follow with PCP.  4. CKD Stage IV Last sCr was stable at 2.48 with eGFR at 29. Avoid nephrotoxic agents. Has upcoming labs with Nephrology - have requested that this is faxed to our office. Encouraged adequate hydration. Continue f/u with Nephrology and PCP.   5. Snoring Stop Bang score 6. Needs an sleep study done to be performed in sleep clinic. Will re-arrange Pulmonary referral. Continue to follow with PCP.   Disposition: Follow up in 6 months with Dina Rich, MD or APP.  Signed, Sharlene Dory, NP 05/30/2022, 10:59 AM [facility]

## 2022-06-02 ENCOUNTER — Encounter: Payer: PPO | Admitting: Physician Assistant

## 2022-06-02 DIAGNOSIS — L97512 Non-pressure chronic ulcer of other part of right foot with fat layer exposed: Secondary | ICD-10-CM | POA: Diagnosis not present

## 2022-06-02 DIAGNOSIS — E11621 Type 2 diabetes mellitus with foot ulcer: Secondary | ICD-10-CM | POA: Diagnosis not present

## 2022-06-02 NOTE — Progress Notes (Signed)
[name], [name] ([id number]) [id number] Page 1 of 7 Visit Report for 06/02/2022 Chief Complaint Document Details Patient Name: Date of Service: [name], [name] 06/02/2022 1:00 PM Medical Record Number: [id number] Patient Account Number: [id number] Date of Birth/Sex: Treating RN: 04-Mar-1960 (62 y.o. [name] Primary Care Provider: Tresa Res Other Clinician: Referring Provider: Treating Provider/Extender: Gary Fleet Weeks in Treatment: 5 Information Obtained from: Patient Chief Complaint Right great callus buildup with concern for underlying ulcer Electronic Signature(s) Signed: 06/02/2022 1:17:18 PM By: Allen Derry PA-C Entered By: Allen Derry on 06/02/2022 13:17:18 -------------------------------------------------------------------------------- Debridement Details Patient Name: Date of Service: [name]. 06/02/2022 1:00 PM Medical Record Number: [id number] Patient Account Number: [id number] Date of Birth/Sex: Treating RN: 09/28/1960 (62 y.o. [name] Primary Care Provider: Tresa Res Other Clinician: Referring Provider: Treating Provider/Extender: Gary Fleet Weeks in Treatment: 5 Debridement Performed for Assessment: Wound #2 Right T Great oe Performed By: Physician Allen Derry, PA-C Debridement Type: Debridement Severity of [name] Pre Debridement: Fat layer exposed Level of Consciousness (Pre-procedure): Awake and Alert Pre-procedure Verification/Time Out Yes - 13:32 Taken: Pain Control: Lidocaine 4% T opical Solution Percent of Wound Bed Debrided: 100% T Area Debrided (cm): otal 0.02 [name] and other material debrided: Viable, Non-Viable, Callus Level: Non-Viable [name] Debridement Description: Selective/Open Wound Instrument: Curette Bleeding: Minimum Hemostasis Achieved: Pressure Response to Treatment: Procedure was tolerated well Level of Consciousness (Post-  Awake and Alert procedure): Post Debridement Measurements of Total Wound Length: (cm) 0.2 Width: (cm) 0.1 Depth: (cm) 0.1 Volume: (cm) 0.002 Character of Wound/Ulcer Post Debridement: Stable Severity of [name] Post Debridement: Fat layer exposed Post Procedure Diagnosis Same as Pre-procedure Electronic Signature(s) Signed: 06/02/2022 4:09:01 PM By: Angelina Pih Signed: 06/02/2022 6:40:35 PM By: Allen Derry PA-C Entered By: Angelina Pih on 06/02/2022 13:34:52 Bohman, [name] ([id number]) [id number] Page 2 of 7 -------------------------------------------------------------------------------- HPI Details Patient Name: Date of Service: [name], [name] 06/02/2022 1:00 PM Medical Record Number: [id number] Patient Account Number: [id number] Date of Birth/Sex: Treating RN: 1960-07-21 (62 y.o. [name] Primary Care Provider: Tresa Res Other Clinician: Referring Provider: Treating Provider/Extender: Gary Fleet Weeks in Treatment: 5 History of Present Illness HPI Description: 11-07-2021 upon evaluation today patient appears to be doing somewhat poorly in regard to a wound on his right plantar great toe. Fortunately there does not appear to be any evidence of active infection locally or systemically at this time which is great news. No fevers, chills, nausea, vomiting, or diarrhea. With that being said he tells me that he really has not been noticing a lot of improvement either with regard to the treatment. He has been under the care of Dr. Loreta Ave up to this point for roughly a year. More recently he did have an MRI which revealed the potential for possible osteomyelitis. With that being said I do believe that he definitely should probably be monitored for this but to be honest the [name] that he was given probably did a good job. Considering there was not much being seen on MRI I do not think there is a high likelihood of  severe osteomyelitis at this point we will definitely keep an eye on things however. Nonetheless right now has been utilizing Silvadene cream I definitely think we can find something better to help get this moving in the right direction. Patient does have a history of diabetes mellitus type 2, chronic kidney disease stage III, hypertension, and again he has had an amputation of  the left leg currently. The right leg is the 1 that we are actually treating the great toe wound on. He has a below-knee amputation. 11-11-2021 upon evaluation today patient's wound actually is showing signs of excellent improvement. I am actually very pleased even compared to last week this is already better. He is very happy as well. 12-02-2021 upon evaluation today patient appears to be doing pretty well in regard to his toe ulcer this been several weeks since I saw him part of it was our scheduling with me being out of town and part of it was he actually missed an appointment secondary to his trunk the will actually fell off and he had some issues there getting it fixed on Monday so he missed his appointment last Monday. Nonetheless the good news is in today things appear to be doing well no signs of infection though he does have a pretty good need for sharp debridement to try to clear some of this away. 11/6; right first toes ulcer complicated by his left-sided BKA.Marland KitchenNo major change from last visit 12-16-2021 upon evaluation today patient appears to be doing well currently in regard to his toe ulcer that was not making a good amount of improvement yet I do believe the total contact cast is really good to be necessary. I talked about that before he tells me his wife can drive him around he is done with working now also he can actually look towards doing this. 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 12-30-2021 upon evaluation today patient appears to be doing well currently in regard to his foot ulcer on the toe although he is still building up quite a bit of callus. Will actually get the total contact cast on him today and started which I think is good to be very beneficial. 01-02-2022 upon evaluation today patient appears to be doing much better in regard to his wound. The cast does seem to do excellent for him. Fortunately there does not appear to be any evidence of active infections at this time. 01-09-2022 upon evaluation today patient appears to be doing excellent in regard to his wound. He is actually showing signs of excellent improvement and in general I think were very close to complete resolution. In fact there might be just a really tiny area, 0.1, still open but again this is much better than where we were prior to put the cast on. Its only been a little over a week since we put the first cast in place. 01-17-2022 upon 11 valuation today patient appears to be doing well currently in regard to his toe ulcer. In fact he appears to be completely healed based on what I am seeing at this point. I do not see any evidence of anything open. Readmission: 04-28-2022 upon evaluation today patient presents for reevaluation here in the clinic I actually saw him last January 17, 2022. At that point he was completely healed. With that being said unfortunately he notes that he has been having some callus buildup in regard to the toe and he figured he should come in and get this checked out sooner rather than later. This is the medial aspect of the right great toe again he has a below-knee amputation on the left. Fortunately I do not see any signs of infection there is a lot of callus that was not definitively  open when I first saw this but we did perform debridement to clear this away that we detailed in the objective section. 05-05-2022 upon evaluation today patient appears to be doing well  currently in regard to his wound. I do not think this is getting much worse he still has some callus buildup but he is supposed to be getting his new diabetic shoes shortly which I think is can help a lot with this. Fortunately I do not see any signs of active infection locally or systemically which is great news. No fevers, chills, nausea, vomiting, or diarrhea. 05-12-2022 upon evaluation today patient appears to be doing well with regard to his toe ulcer. This is actually showing signs of improvement which is great news. Fortunately I do not see any signs of active infection locally nor systemically at this time which is great news. No fevers, chills, nausea, vomiting, or diarrhea. 05-19-2022 upon evaluation today patient appears to be doing well currently in regard to his wound although it is really not much better it is about the same. Fortunately I do not see any signs of active infection locally or systemically which is great news. No fevers, chills, nausea, vomiting, or diarrhea. 05-26-2022 upon evaluation today patient appears to be doing poorly in regard to his toe ulcer this is still not doing quite as well as what we like to see. Fortunately I do not see any signs of active infection locally or systemically which is good news. With that being said he does have evidence of callus buildup and the wound is still open underneath I think this is going to probably end up being a little bit deeper in fact compared to what it was previous. 06-02-2022 upon evaluation today patient appears to be doing well currently in regard to his toe ulcer we are actually considering a total contact cast but he actually seems to be doing quite well at this point. Fortunately I do not see any need for initiating the cast although that something we will continue to monitor for. He is in agreement the plan has been try to pad it more and stay off of it more as well. Electronic Signature(s) Signed: 06/02/2022 6:16:56 PM By:  Allen Derry PA-C Entered By: Allen Derry on 06/02/2022 18:16:55 [name], [name] ([id number]) [id number] Page 3 of 7 -------------------------------------------------------------------------------- Physical Exam Details Patient Name: Date of Service: [name], [name] 06/02/2022 1:00 PM Medical Record Number: [id number] Patient Account Number: [id number] Date of Birth/Sex: Treating RN: 12-14-1960 (62 y.o. [name] Primary Care Provider: Tresa Res Other Clinician: Referring Provider: Treating Provider/Extender: Gary Fleet Weeks in Treatment: 5 Constitutional Well-nourished and well-hydrated in no acute distress. Respiratory normal breathing without difficulty. Psychiatric this patient is able to make decisions and demonstrates good insight into disease process. Alert and Oriented x 3. pleasant and cooperative. Notes Upon inspection patient's wound bed actually showed signs of good granulation and epithelization at this point. Fortunately there does not appear to be any signs of active infection locally nor systemically which is great news. No fevers, chills, nausea, vomiting, or diarrhea. I did perform debridement to clearway some of the necrotic debris postdebridement this is significantly smaller and looks to be doing extremely well. Electronic Signature(s) Signed: 06/02/2022 6:17:20 PM By: Allen Derry PA-C Entered By: Allen Derry on 06/02/2022 18:17:20 -------------------------------------------------------------------------------- Physician Orders Details Patient Name: Date of Service: [name] 06/02/2022 1:00 PM Medical Record Number: [id number] Patient Account Number: [id number] Date of Birth/Sex: Treating RN:  [birth year] (62 y.o. [name] Primary Care Provider: Tresa Res Other Clinician: Referring Provider: Treating Provider/Extender: Gary Fleet Weeks in Treatment:  5 Verbal / Phone Orders: No Diagnosis Coding ICD-10 Coding Code Description E11.621 Type 2 diabetes mellitus with foot ulcer L97.512 Non-pressure chronic ulcer of other part of right foot with fat layer exposed L84 Corns and callosities N18.30 Chronic kidney disease, stage 3 unspecified I10 Essential (primary) hypertension Z89.512 Acquired absence of left leg below knee Follow-up Appointments Return Appointment in 1 week. Bathing/ [employer] wounds with antibacterial soap and water. May shower; gently cleanse wound with antibacterial soap, rinse and pat dry prior to dressing wounds No tub bath. Anesthetic (Use 'Patient Medications' Section for Anesthetic Order Entry) Lidocaine applied to wound bed Wound Treatment Wound #2 - T Great oe Wound Laterality: Right Cleanser: Soap and Water 3 x Per Week/30 Days Discharge Instructions: Gently cleanse wound with antibacterial soap, rinse and pat dry prior to dressing wounds Prim Dressing: Foam Dressing, 4x4 (in/in) 3 x Per Week/30 Days ary Discharge Instructions: foam padding on top of HB Prim Dressing: Hydrofera Blue Ready Transfer Foam, 2.5x2.5 (in/in) 3 x Per Week/30 Days ary Discharge Instructions: Apply Hydrofera Blue Ready to wound bed as directed [name], [name] ([id number]) [id number] Page 4 of 7 Secondary Dressing: Coverlet Latex-Free Fabric Adhesive Dressings 3 x Per Week/30 Days Discharge Instructions: Knuckle Electronic Signature(s) Signed: 06/02/2022 1:49:21 PM By: Angelina Pih Signed: 06/02/2022 6:40:35 PM By: Allen Derry PA-C Entered By: Angelina Pih on 06/02/2022 13:49:21 -------------------------------------------------------------------------------- Problem List Details Patient Name: Date of Service: [name]. 06/02/2022 1:00 PM Medical Record Number: [id number] Patient Account Number: [id number] Date of Birth/Sex: Treating RN: 04-12-1960 (62 y.o. [name] Primary Care Provider: Tresa Res Other Clinician: Referring Provider: Treating Provider/Extender: Gary Fleet Weeks in Treatment: 5 Active Problems ICD-10 Encounter Code Description Active Date MDM Diagnosis E11.621 Type 2 diabetes mellitus with foot ulcer 04/28/2022 No Yes L97.512 Non-pressure chronic ulcer of other part of right foot with fat layer exposed 04/28/2022 No Yes L84 Corns and callosities 04/28/2022 No Yes N18.30 Chronic kidney disease, stage 3 unspecified 04/28/2022 No Yes I10 Essential (primary) hypertension 04/28/2022 No Yes Z89.512 Acquired absence of left leg below knee 04/28/2022 No Yes Inactive Problems Resolved Problems Electronic Signature(s) Signed: 06/02/2022 1:17:13 PM By: Allen Derry PA-C Entered By: Allen Derry on 06/02/2022 13:17:13 -------------------------------------------------------------------------------- Progress Note Details Patient Name: Date of Service: [name]. 06/02/2022 1:00 PM Medical Record Number: [id number] Patient Account Number: [id number] Date of Birth/Sex: Treating RN: 24-Apr-1960 (62 y.o. [name] Primary Care Provider: Tresa Res Other Clinician: Referring Provider: Treating Provider/Extender: Gary Fleet Weeks in Treatment: 5 [name], [name] ([id number]) [id number] Page 5 of 7 Subjective Chief Complaint Information obtained from Patient Right great callus buildup with concern for underlying ulcer History of Present Illness (HPI) 11-07-2021 upon evaluation today patient appears to be doing somewhat poorly in regard to a wound on his right plantar great toe. Fortunately there does not appear to be any evidence of active infection locally or systemically at this time which is great news. No fevers, chills, nausea, vomiting, or diarrhea. With that being said he tells me that he really has not been noticing a lot of improvement either with  regard to the treatment. He has been under the care of Dr. Loreta Ave up to this point for roughly a year. More recently he did have an MRI which revealed the potential for  possible osteomyelitis. With that being said I do believe that he definitely should probably be monitored for this but to be honest the [name] that he was given probably did a good job. Considering there was not much being seen on MRI I do not think there is a high likelihood of severe osteomyelitis at this point we will definitely keep an eye on things however. Nonetheless right now has been utilizing Silvadene cream I definitely think we can find something better to help get this moving in the right direction. Patient does have a history of diabetes mellitus type 2, chronic kidney disease stage III, hypertension, and again he has had an amputation of the left leg currently. The right leg is the 1 that we are actually treating the great toe wound on. He has a below-knee amputation. 11-11-2021 upon evaluation today patient's wound actually is showing signs of excellent improvement. I am actually very pleased even compared to last week this is already better. He is very happy as well. 12-02-2021 upon evaluation today patient appears to be doing pretty well in regard to his toe ulcer this been several weeks since I saw him part of it was our scheduling with me being out of town and part of it was he actually missed an appointment secondary to his trunk the will actually fell off and he had some issues there getting it fixed on Monday so he missed his appointment last Monday. Nonetheless the good news is in today things appear to be doing well no signs of infection though he does have a pretty good need for sharp debridement to try to clear some of this away. 11/6; right first toes ulcer complicated by his left-sided BKA.Marland KitchenNo major change from last visit 12-16-2021 upon evaluation today patient appears to be doing well currently in regard to  his toe ulcer that was not making a good amount of improvement yet I do believe the total contact cast is really good to be necessary. I talked about that before he tells me his wife can drive him around he is done with working now also he can actually look towards doing this. 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 12-30-2021 upon evaluation today patient appears to be doing well currently in regard to his foot ulcer on the toe although he is still building up quite a bit of callus. Will actually get the total contact cast on him today and started which I think is good to be very beneficial. 01-02-2022 upon evaluation today patient appears to be doing much better in regard to his wound. The cast does seem to do excellent for him. Fortunately there does not appear to be any evidence of active infections at this time. 01-09-2022 upon evaluation today patient appears to be doing excellent in regard to his wound. He is actually showing signs of excellent improvement and in general I think were very close to complete resolution. In fact there might be just a really tiny area, 0.1, still open but again this is much better than where we were prior to put the cast on. Its only been a little over a week since we put the first cast in place. 01-17-2022 upon 11 valuation today patient appears to be doing well currently in regard to his toe ulcer. In fact he appears to be completely healed based on what I am  seeing at this point. I do not see any evidence of anything open. Readmission: 04-28-2022 upon evaluation today patient presents for reevaluation here in the clinic I actually saw him last January 17, 2022. At that point he was completely healed. With that being said unfortunately he notes that he has been having some callus buildup in regard to the toe and he figured he should come in and  get this checked out sooner rather than later. This is the medial aspect of the right great toe again he has a below-knee amputation on the left. Fortunately I do not see any signs of infection there is a lot of callus that was not definitively open when I first saw this but we did perform debridement to clear this away that we detailed in the objective section. 05-05-2022 upon evaluation today patient appears to be doing well currently in regard to his wound. I do not think this is getting much worse he still has some callus buildup but he is supposed to be getting his new diabetic shoes shortly which I think is can help a lot with this. Fortunately I do not see any signs of active infection locally or systemically which is great news. No fevers, chills, nausea, vomiting, or diarrhea. 05-12-2022 upon evaluation today patient appears to be doing well with regard to his toe ulcer. This is actually showing signs of improvement which is great news. Fortunately I do not see any signs of active infection locally nor systemically at this time which is great news. No fevers, chills, nausea, vomiting, or diarrhea. 05-19-2022 upon evaluation today patient appears to be doing well currently in regard to his wound although it is really not much better it is about the same. Fortunately I do not see any signs of active infection locally or systemically which is great news. No fevers, chills, nausea, vomiting, or diarrhea. 05-26-2022 upon evaluation today patient appears to be doing poorly in regard to his toe ulcer this is still not doing quite as well as what we like to see. Fortunately I do not see any signs of active infection locally or systemically which is good news. With that being said he does have evidence of callus buildup and the wound is still open underneath I think this is going to probably end up being a little bit deeper in fact compared to what it was previous. 06-02-2022 upon evaluation today patient  appears to be doing well currently in regard to his toe ulcer we are actually considering a total contact cast but he actually seems to be doing quite well at this point. Fortunately I do not see any need for initiating the cast although that something we will continue to monitor for. He is in agreement the plan has been try to pad it more and stay off of it more as well. Objective Constitutional Well-nourished and well-hydrated in no acute distress. Vitals Time Taken: 1:09 PM, Height: 77 in, Weight: 257 lbs, BMI: 30.5, Temperature: 97.5 F, Pulse: 62 bpm, Respiratory Rate: 18 breaths/min, Blood Pressure: 162/88 mmHg. [name], [name] ([id number]) [id number] Page 6 of 7 Respiratory normal breathing without difficulty. Psychiatric this patient is able to make decisions and demonstrates good insight into disease process. Alert and Oriented x 3. pleasant and cooperative. General Notes: Upon inspection patient's wound bed actually showed signs of good granulation and epithelization at this point. Fortunately there does not appear to be any signs of active infection locally nor systemically which is great news. No fevers, chills,  nausea, vomiting, or diarrhea. I did perform debridement to clearway some of the necrotic debris postdebridement this is significantly smaller and looks to be doing extremely well. Integumentary (Hair, Skin) Wound #2 status is Open. Original cause of wound was Gradually Appeared. The date acquired was: 04/04/2022. The wound has been in treatment 5 weeks. The wound is located on the Right T Great. The wound measures 0.1cm length x 0.1cm width x 0.1cm depth; 0.008cm^2 area and 0.001cm^3 volume. There is Fat oe Layer (Subcutaneous [name]) exposed. There is no tunneling or undermining noted. There is a medium amount of serosanguineous drainage noted. There is large (67-100%) pink granulation within the wound bed. There is a small (1-33%) amount of  necrotic [name] within the wound bed including Adherent Slough. Assessment Active Problems ICD-10 Type 2 diabetes mellitus with foot ulcer Non-pressure chronic ulcer of other part of right foot with fat layer exposed Corns and callosities Chronic kidney disease, stage 3 unspecified Essential (primary) hypertension Acquired absence of left leg below knee Procedures Wound #2 Pre-procedure diagnosis of Wound #2 is a Diabetic Wound/Ulcer of the Lower Extremity located on the Right T Great .Severity of [name] Pre Debridement is: oe Fat layer exposed. There was a Selective/Open Wound Non-Viable [name] Debridement with a total area of 0.02 sq cm performed by Allen Derry, PA-C. With the following instrument(s): Curette to remove Viable and Non-Viable [name]/material. Material removed includes Callus after achieving pain control using Lidocaine 4% T opical Solution. No specimens were taken. A time out was conducted at 13:32, prior to the start of the procedure. A Minimum amount of bleeding was controlled with Pressure. The procedure was tolerated well. Post Debridement Measurements: 0.2cm length x 0.1cm width x 0.1cm depth; 0.002cm^3 volume. Character of Wound/Ulcer Post Debridement is stable. Severity of [name] Post Debridement is: Fat layer exposed. Post procedure Diagnosis Wound #2: Same as Pre-Procedure Plan Follow-up Appointments: Return Appointment in 1 week. Bathing/ Shower/ Hygiene: Wash wounds with antibacterial soap and water. May shower; gently cleanse wound with antibacterial soap, rinse and pat dry prior to dressing wounds No tub bath. Anesthetic (Use 'Patient Medications' Section for Anesthetic Order Entry): Lidocaine applied to wound bed WOUND #2: - T Great Wound Laterality: Right oe Cleanser: Soap and Water 3 x Per Week/30 Days Discharge Instructions: Gently cleanse wound with antibacterial soap, rinse and pat dry prior to dressing wounds Prim Dressing: Foam Dressing, 4x4  (in/in) 3 x Per Week/30 Days ary Discharge Instructions: foam padding on top of HB Prim Dressing: Hydrofera Blue Ready Transfer Foam, 2.5x2.5 (in/in) 3 x Per Week/30 Days ary Discharge Instructions: Apply Hydrofera Blue Ready to wound bed as directed Secondary Dressing: Coverlet Latex-Free Fabric Adhesive Dressings 3 x Per Week/30 Days Discharge Instructions: Knuckle 1. I would recommend currently that we have the patient continue with the wound care measures as before and he is in agreement with plan this includes the use of the [facility] dressing which I think is doing decently well and using a coverlet is also padding this a bit more. 2. I am also can recommend he continue to try to stay off of this is much as possible more that he can do the better off he will be We will see patient back for reevaluation in 1 week here in the clinic. If anything worsens or changes patient will contact our office for additional recommendations. Electronic Signature(s) [name], [name] ([id number]) [id number] Page 7 of 7 Signed: 06/02/2022 6:19:55 PM By: Allen Derry PA-C Entered By: Allen Derry  on 06/02/2022 18:19:54 -------------------------------------------------------------------------------- SuperBill Details Patient Name: Date of Service: [name], [name] 06/02/2022 Medical Record Number: [id number] Patient Account Number: [id number] Date of Birth/Sex: Treating RN: Mar 22, 1960 (62 y.o. [name] Primary Care Provider: Tresa Res Other Clinician: Referring Provider: Treating Provider/Extender: Gary Fleet Weeks in Treatment: 5 Diagnosis Coding ICD-10 Codes Code Description E11.621 Type 2 diabetes mellitus with foot ulcer L97.512 Non-pressure chronic ulcer of other part of right foot with fat layer exposed L84 Corns and callosities N18.30 Chronic kidney disease, stage 3 unspecified I10 Essential (primary) hypertension Z89.512  Acquired absence of left leg below knee Facility Procedures : CPT4 Code: [id number] Description: 97597 - DEBRIDE WOUND 1ST 20 SQ CM OR < ICD-10 Diagnosis Description L97.512 Non-pressure chronic ulcer of other part of right foot with fat layer exposed Modifier: Quantity: 1 Physician Procedures : CPT4 Code Description Modifier [id number] 97597 - WC PHYS DEBR WO ANESTH 20 SQ CM ICD-10 Diagnosis Description L97.512 Non-pressure chronic ulcer of other part of right foot with fat layer exposed Quantity: 1 Electronic Signature(s) Signed: 06/02/2022 6:20:02 PM By: Allen Derry PA-C Entered By: Allen Derry on 06/02/2022 18:20:01

## 2022-06-02 NOTE — Progress Notes (Signed)
[name], [name] ([id number]) [phone] Page 1 of 1 Visit Report for 06/02/2022 Fall Risk Assessment Details Patient Name: Date of Service: [name], Dustin 06/02/2022 1:00 PM Medical Record Number: [id number] Patient Account Number: [id number] Date of Birth/Sex: Treating RN: Sep 03, 1960 (62 y.o. [name] Toiya Morrish: Tresa Res Other Clinician: Referring Marin Milley: Treating Jasara Corrigan/Extender: Gary Fleet Weeks in Treatment: 5 Fall Risk Assessment Items Have you had 2 or more falls in the last 12 monthso 0 No Have you had any fall that resulted in injury in the last 12 monthso 0 No FALLS RISK SCREEN History of falling - immediate or within 3 months 25 Yes Secondary diagnosis (Do you have 2 or more medical diagnoseso) 0 No Ambulatory aid None/bed rest/wheelchair/nurse 0 Yes Crutches/cane/walker 0 No Furniture 0 No Intravenous therapy Access/Saline/Heparin Lock 0 No Gait/Transferring Normal/ bed rest/ wheelchair 0 Yes Weak (short steps with or without shuffle, stooped but able to lift head while walking, may seek 0 No support from furniture) Impaired (short steps with shuffle, may have difficulty arising from chair, head down, impaired 0 No balance) Mental Status Oriented to own ability 0 Yes Electronic Signature(s) Signed: 06/02/2022 1:50:29 PM By: Angelina Pih Entered By: Angelina Pih on 06/02/2022 13:50:29

## 2022-06-03 ENCOUNTER — Institutional Professional Consult (permissible substitution): Payer: HMO | Admitting: Pulmonary Disease

## 2022-06-03 NOTE — Progress Notes (Signed)
KAYMAN, SNUFFER ([id number]) [id number] Page 1 of 7 Visit Report for 06/02/2022 Arrival Information Details Patient Name: Date of Service: [name], [name] 06/02/2022 1:00 PM Medical Record Number: [id number] Patient Account Number: [id number] Date of Birth/Sex: Treating RN: [birth year] (62 y.o. [name] Primary Care [name]: [name] Other Clinician: Referring [name]: Treating [name]/Extender: [name] Visit Information History Since Last Visit Added or deleted any medications: No Patient Arrived: Ambulatory Any new allergies or adverse reactions: No Arrival Time: 13:09 Had a fall or experienced change in Yes Accompanied By: self activities of daily living that may affect Transfer Assistance: None risk of falls: Patient Identification Verified: Yes Hospitalized since last visit: No Secondary Verification Process Completed: Yes Has Dressing in Place as Prescribed: Yes Patient Has Alerts: Yes Pain Present Now: Yes Patient Alerts: type 2 diabetic ABI 09/05/21 R 1.26 ABI 09/05/21 L 1.10 Notes pt states fell in grocery store due to surgical shoe and not having good balance, PA Stone aware Electronic Signature(s) Signed: 06/02/2022 1:50:01 PM By: Angelina Pih Entered By: Angelina Pih on 06/02/2022 13:50:01 -------------------------------------------------------------------------------- Clinic Level of Care Assessment Details Patient Name: Date of Service: [name], [name] 06/02/2022 1:00 PM Medical Record Number: [id number] Patient Account Number: [id number] Date of Birth/Sex: Treating RN: Jul 04, 1960 (62 y.o. [name] Primary Care Neiman Roots: [name] Other Clinician: Referring [name]: Treating [name]/Extender: [name] Clinic Level of Care Assessment Items TOOL 1 Quantity Score []  - 0 Use when EandM and Procedure is  performed on INITIAL visit ASSESSMENTS - Nursing Assessment / Reassessment []  - 0 General Physical Exam (combine w/ comprehensive assessment (listed just below) when performed on new pt. evals) []  - 0 Comprehensive Assessment (HX, ROS, Risk Assessments, Wounds Hx, etc.) ASSESSMENTS - Wound and Skin Assessment / Reassessment []  - 0 Dermatologic / Skin Assessment (not related to wound area) ASSESSMENTS - Ostomy and/or Continence Assessment and Care []  - 0 Incontinence Assessment and Management []  - 0 Ostomy Care Assessment and Management (repouching, etc.) PROCESS - Coordination of Care []  - 0 Simple Patient / Family Education for ongoing care []  - 0 Complex (extensive) Patient / Family Education for ongoing care []  - 0 Staff obtains Chiropractor, Records, T Results / Process Orders est []  - 0 Staff telephones HHA, Nursing Homes / Clarify orders / etc []  - 0 Routine Transfer to another Facility (non-emergent condition) []  - 0 Routine Hospital Admission (non-emergent condition) []  - 0 New Admissions / Insurance Authorizations / Ordering NPWT Apligraf, etc. , [name], Dustin L ([id number]) [id number] Page 2 of 7 []  - 0 Emergency Hospital Admission (emergent condition) PROCESS - Special Needs []  - 0 Pediatric / Minor Patient Management []  - 0 Isolation Patient Management []  - 0 Hearing / Language / Visual special needs []  - 0 Assessment of Community assistance (transportation, D/C planning, etc.) []  - 0 Additional assistance / Altered mentation []  - 0 Support Surface(s) Assessment (bed, cushion, seat, etc.) INTERVENTIONS - Miscellaneous []  - 0 External ear exam []  - 0 Patient Transfer (multiple staff / Nurse, adult / Similar devices) []  - 0 Simple Staple / Suture removal (25 or less) []  - 0 Complex Staple / Suture removal (26 or more) []  - 0 Hypo/Hyperglycemic Management (do not check if billed separately) []  - 0 Ankle / Brachial Index (ABI) -  do not check if billed separately Has the patient been seen at the hospital within the last three years: Yes  Total Score: 0 Level Of Care: ____ Electronic Signature(s) Signed: 06/02/2022 4:09:01 PM By: Angelina Pih Entered By: Angelina Pih on 06/02/2022 13:50:41 -------------------------------------------------------------------------------- Encounter Discharge Information Details Patient Name: Date of Service: Dustin Hurt. 06/02/2022 1:00 PM Medical Record Number: [id number] Patient Account Number: [id number] Date of Birth/Sex: Treating RN: [birth year] (62 y.o. [name] Primary Care Antwaine Boomhower: [name] Other Clinician: Referring Nijae Doyel: Treating [name]/Extender: [name] Encounter Discharge Information Items Post Procedure Vitals Discharge Condition: Stable Temperature (F): 97.[name] Ambulatory Status: Ambulatory Pulse (bpm): 62 Discharge Destination: Home Respiratory Rate (breaths/min): 18 Transportation: Private Auto Blood Pressure (mmHg): 162/88 Accompanied By: self Schedule Follow-up Appointment: Yes Clinical Summary of Care: Electronic Signature(s) Signed: 06/02/2022 1:52:19 PM By: Angelina Pih Entered By: Angelina Pih on 06/02/2022 13:52:19 -------------------------------------------------------------------------------- Lower Extremity Assessment Details Patient Name: Date of Service: [name], [name] 06/02/2022 1:00 PM Medical Record Number: [id number] Patient Account Number: [id number] Date of Birth/Sex: Treating RN: [birth year] (62 y.o. [name] Primary Care [name]: [name] Other Clinician: Referring [name]: Treating [name]/Extender: [name] Edema Assessment Assessed: [Left: No] [Right: No] S[LeftPHOENYX, [name] ([id number])] [Right: [id number] Page 3 of 7] Edema: [Left: N] [Right: o] Calf Left:  Right: Point of Measurement: 41 cm From Medial Instep 37.6 cm Ankle Left: Right: Point of Measurement: 12 cm From Medial Instep 22 cm Vascular Assessment Pulses: Dorsalis Pedis Palpable: [Right:Yes] Posterior Tibial Palpable: [Right:Yes] Electronic Signature(s) Signed: 06/02/2022 4:09:01 PM By: Angelina Pih Entered By: Angelina Pih on 06/02/2022 13:17:15 -------------------------------------------------------------------------------- Multi Wound Chart Details Patient Name: Date of Service: Dustin Hurt. 06/02/2022 1:00 PM Medical Record Number: [id number] Patient Account Number: [id number] Date of Birth/Sex: Treating RN: 1961-02-02 (62 y.o. [name] Primary Care Chaos Carlile: [name] Other Clinician: Referring Kahley Leib: Treating Kylo Gavin/Extender: [name] Vital Signs Height(in): 77 Pulse(bpm): 62 Weight(lbs): 257 Blood Pressure(mmHg): 162/88 Body Mass Index(BMI): 30.[name] Temperature(F): 97.[name] Respiratory Rate(breaths/min): 18 [2:Photos:] [N/A:N/A] Right T Great oe N/A N/A Wound Location: Gradually Appeared N/A N/A Wounding Event: Diabetic Wound/Ulcer of the Lower N/A N/A Primary Etiology: Extremity Coronary Artery Disease, N/A N/A Comorbid History: Hypertension, Peripheral Venous Disease, Type II Diabetes, Neuropathy 04/04/2022 N/A N/A Date Acquired: [name] N/A N/A Weeks of Treatment: Open N/A N/A Wound Status: No N/A N/A Wound Recurrence: 0.1x0.1x0.1 N/A N/A Measurements L x W x D (cm) 0.008 N/A N/A A (cm) : rea 0.001 N/A N/A Volume (cm) : 91.50% N/A N/A % Reduction in A rea: 94.70% N/A N/A % Reduction in Volume: Grade 1 N/A N/A Classification: Medium N/A N/A Exudate A [name]: Serosanguineous N/A N/A Exudate Type: red, brown N/A N/A Exudate Color: Large (67-100%) N/A N/A Granulation A [name]: Pink N/A N/A Granulation Quality: Bocock, Trent L ([id number])  [id number] Page 4 of 7 Small (1-33%) N/A N/A Necrotic A [name]: Fat Layer (Subcutaneous Tissue): Yes N/A N/A Exposed Structures: None N/A N/A Epithelialization: Treatment Notes Electronic Signature(s) Signed: 06/02/2022 4:09:01 PM By: Angelina Pih Entered By: Angelina Pih on 06/02/2022 13:31:29 -------------------------------------------------------------------------------- Multi-Disciplinary Care Plan Details Patient Name: Date of Service: Dustin Hurt. 06/02/2022 1:00 PM Medical Record Number: [id number] Patient Account Number: [id number] Date of Birth/Sex: Treating RN: 1960-03-26 (62 y.o. [name] Primary Care Henli Hey: [name] Other Clinician: Referring Jeidy Hoerner: Treating Lenae Wherley/Extender: [name] Active Inactive Wound/Skin Impairment Nursing Diagnoses: Impaired tissue integrity Knowledge deficit related to ulceration/compromised skin integrity Goals: Ulcer/skin  breakdown will have a volume reduction of 30% by week 4 Date Initiated: 04/28/2022 Date Inactivated: 05/26/2022 Target Resolution Date: 05/26/2022 Goal Status: Met Ulcer/skin breakdown will have a volume reduction of 50% by week 8 Date Initiated: 04/28/2022 Target Resolution Date: [name]/20/2024 Goal Status: Active Ulcer/skin breakdown will have a volume reduction of 80% by week 12 Date Initiated: 04/28/2022 Target Resolution Date: 07/21/2022 Goal Status: Active Ulcer/skin breakdown will heal within 14 weeks Date Initiated: 04/28/2022 Target Resolution Date: 08/04/2022 Goal Status: Active Interventions: Assess patient/caregiver ability to obtain necessary supplies Assess patient/caregiver ability to perform ulcer/skin care regimen upon admission and as needed Assess ulceration(s) every visit Provide education on ulcer and skin care Treatment Activities: Skin care regimen initiated : 04/28/2022 Notes: Electronic  Signature(s) Signed: 06/02/2022 1:51:07 PM By: Angelina Pih Entered By: Angelina Pih on 06/02/2022 13:51:07 -------------------------------------------------------------------------------- Pain Assessment Details Patient Name: Date of Service: [name], [name] 06/02/2022 1:00 PM Medical Record Number: [id number] Patient Account Number: [id number] Date of Birth/Sex: Treating RN: [birth year] (62 y.o. [name] Primary Care Darlys Buis: [name] Other Clinician: Referring Nithya Meriweather: Treating Governor Matos/Extender: [name] Madelia, [name] ([id number]) [id number] Page [name] of 7 Active Problems Location of Pain Severity and Description of Pain Patient Has Paino Yes Site Locations Rate the pain. Current Pain Level: 10 Pain Management and Medication Current Pain Management: Notes phantom pain to left leg, no pain at wound site Electronic Signature(s) Signed: 06/02/2022 4:09:01 PM By: Angelina Pih Entered By: Angelina Pih on 06/02/2022 13:11:52 -------------------------------------------------------------------------------- Patient/Caregiver Education Details Patient Name: Date of Service: Dustin Hurt 4/29/2024andnbsp1:00 PM Medical Record Number: [id number] Patient Account Number: [id number] Date of Birth/Gender: Treating RN: 31-Oct-1960 (62 y.o. [name] Primary Care Physician: [name] Other Clinician: Referring Physician: Treating Physician/Extender: Sigurd Sos in Treatment: [name] Education Assessment Education Provided To: Patient Education Topics Provided Offloading: Handouts: How Offloading Helps Foot Wounds Heal Methods: Explain/Verbal Responses: State content correctly Wound Debridement: Handouts: Wound Debridement Methods: Explain/Verbal Responses: State content correctly Wound/Skin Impairment: Handouts: Caring for Your Ulcer Methods:  Explain/Verbal Responses: State content correctly Electronic Signature(s) Signed: 06/02/2022 4:09:01 PM By: Angelina Pih Entered By: Angelina Pih on 06/02/2022 13:51:29 Sugarman, [name] ([id number]) [id number] Page 6 of 7 -------------------------------------------------------------------------------- Wound Assessment Details Patient Name: Date of Service: [name], [name] 06/02/2022 1:00 PM Medical Record Number: [id number] Patient Account Number: [id number] Date of Birth/Sex: Treating RN: [birth year] (62 y.o. [name] Primary Care Kelyn Ponciano: [name] Other Clinician: Referring Naftali Carchi: Treating Kannen Moxey/Extender: [name] Wound Status Wound Number: 2 Primary Diabetic Wound/Ulcer of the Lower Extremity Etiology: Wound Location: Right T Great oe Wound Open Wounding Event: Gradually Appeared Status: Date Acquired: 04/04/2022 Comorbid Coronary Artery Disease, Hypertension, Peripheral Venous Weeks Of Treatment: [name] History: Disease, Type II Diabetes, Neuropathy Clustered Wound: No Photos Wound Measurements Length: (cm) 0.1 Width: (cm) 0.1 Depth: (cm) 0.1 Area: (cm) 0.008 Volume: (cm) 0.001 % Reduction in Area: 91.[name]% % Reduction in Volume: 94.7% Epithelialization: None Tunneling: No Undermining: No Wound Description Classification: Grade 1 Exudate Amount: Medium Exudate Type: Serosanguineous Exudate Color: red, brown Foul Odor After Cleansing: No Slough/Fibrino Yes Wound Bed Granulation Amount: Large (67-100%) Exposed Structure Granulation Quality: Pink Fat Layer (Subcutaneous Tissue) Exposed: Yes Necrotic Amount: Small (1-33%) Necrotic Quality: Adherent Slough Treatment Notes Wound #2 (Toe Great) Wound Laterality: Right Cleanser Soap and Water Discharge Instruction: Gently cleanse wound with antibacterial soap, rinse and pat dry prior to  dressing wounds Peri-Wound  Care Topical Primary Dressing Foam Dressing, 4x4 (in/in) Discharge Instruction: foam padding on top of HB Hydrofera Blue Ready Transfer Foam, 2.5x2.[name] (in/in) Discharge Instruction: Apply Hydrofera Blue Ready to wound bed as directed Secondary Dressing Coverlet Latex-Free Fabric Adhesive Dressings Discharge Instruction: ORLONDO, HOLYCROSS ([id number]) [id number] Page 7 of 7 Secured With Compression Wrap Compression Stockings Add-Ons Electronic Signature(s) Signed: 06/02/2022 4:09:01 PM By: Angelina Pih Entered By: Angelina Pih on 06/02/2022 13:16:33 -------------------------------------------------------------------------------- Vitals Details Patient Name: Date of Service: Dustin Hurt. 06/02/2022 1:00 PM Medical Record Number: [id number] Patient Account Number: [id number] Date of Birth/Sex: Treating RN: 14-Feb-1960 (62 y.o. [name] Primary Care Tajon Moring: [name] Other Clinician: Referring Georgeann Brinkman: Treating Adria Costley/Extender: [name] Vital Signs Time Taken: 13:09 Temperature (F): 97.[name] Height (in): 77 Pulse (bpm): 62 Weight (lbs): 257 Respiratory Rate (breaths/min): 18 Body Mass Index (BMI): 30.[name] Blood Pressure (mmHg): 162/88 Reference Range: 80 - 120 mg / dl Electronic Signature(s) Signed: 06/02/2022 4:09:01 PM By: Angelina Pih Entered By: Angelina Pih on 06/02/2022 13:11:23

## 2022-06-08 ENCOUNTER — Other Ambulatory Visit: Payer: Self-pay | Admitting: Cardiology

## 2022-06-09 ENCOUNTER — Ambulatory Visit: Payer: PPO | Admitting: Physician Assistant

## 2022-06-11 DIAGNOSIS — Z299 Encounter for prophylactic measures, unspecified: Secondary | ICD-10-CM | POA: Diagnosis not present

## 2022-06-11 DIAGNOSIS — Z7189 Other specified counseling: Secondary | ICD-10-CM | POA: Diagnosis not present

## 2022-06-11 DIAGNOSIS — Z1331 Encounter for screening for depression: Secondary | ICD-10-CM | POA: Diagnosis not present

## 2022-06-11 DIAGNOSIS — Z0000 Encounter for general adult medical examination without abnormal findings: Secondary | ICD-10-CM | POA: Diagnosis not present

## 2022-06-11 DIAGNOSIS — I700 Atherosclerosis of aorta: Secondary | ICD-10-CM | POA: Diagnosis not present

## 2022-06-11 DIAGNOSIS — E1165 Type 2 diabetes mellitus with hyperglycemia: Secondary | ICD-10-CM | POA: Diagnosis not present

## 2022-06-11 DIAGNOSIS — J309 Allergic rhinitis, unspecified: Secondary | ICD-10-CM | POA: Diagnosis not present

## 2022-06-11 DIAGNOSIS — Z1339 Encounter for screening examination for other mental health and behavioral disorders: Secondary | ICD-10-CM | POA: Diagnosis not present

## 2022-06-16 ENCOUNTER — Encounter: Payer: PPO | Attending: Physician Assistant | Admitting: Physician Assistant

## 2022-06-16 ENCOUNTER — Other Ambulatory Visit: Payer: Self-pay | Admitting: *Deleted

## 2022-06-16 ENCOUNTER — Telehealth: Payer: Self-pay | Admitting: Nurse Practitioner

## 2022-06-16 DIAGNOSIS — L97512 Non-pressure chronic ulcer of other part of right foot with fat layer exposed: Secondary | ICD-10-CM | POA: Insufficient documentation

## 2022-06-16 DIAGNOSIS — X58XXXA Exposure to other specified factors, initial encounter: Secondary | ICD-10-CM | POA: Insufficient documentation

## 2022-06-16 DIAGNOSIS — E1122 Type 2 diabetes mellitus with diabetic chronic kidney disease: Secondary | ICD-10-CM | POA: Insufficient documentation

## 2022-06-16 DIAGNOSIS — Z89512 Acquired absence of left leg below knee: Secondary | ICD-10-CM | POA: Insufficient documentation

## 2022-06-16 DIAGNOSIS — N183 Chronic kidney disease, stage 3 unspecified: Secondary | ICD-10-CM | POA: Insufficient documentation

## 2022-06-16 DIAGNOSIS — L84 Corns and callosities: Secondary | ICD-10-CM | POA: Insufficient documentation

## 2022-06-16 DIAGNOSIS — E11621 Type 2 diabetes mellitus with foot ulcer: Secondary | ICD-10-CM | POA: Insufficient documentation

## 2022-06-16 DIAGNOSIS — S80811A Abrasion, right lower leg, initial encounter: Secondary | ICD-10-CM | POA: Insufficient documentation

## 2022-06-16 DIAGNOSIS — I129 Hypertensive chronic kidney disease with stage 1 through stage 4 chronic kidney disease, or unspecified chronic kidney disease: Secondary | ICD-10-CM | POA: Insufficient documentation

## 2022-06-16 MED ORDER — NITROGLYCERIN 0.4 MG SL SUBL: 0.4000 mg | SUBLINGUAL_TABLET | SUBLINGUAL | 3 refills | Status: DC | PRN

## 2022-06-16 NOTE — Telephone Encounter (Signed)
Discussed chest pain with patient.  Rating 3/10 currently.  Just seen by Sharlene Dory, NP on 05/30/2022.  States he has been having increased SOB w/ exertion x last 2 weeks.  No weight gain, dizziness or n/v.  BP recently at foot doctor office was 112/73.  Discussed patient with provider - will give rx for Ntg 0.4mg  as needed - instructed on proper usage.  OV scheduled for 06/24/2022 with Sharlene Dory, NP for further evaluation - she will discuss with patient to see if he needs echo or stress testing when see him in office.  Instructed patient to be seen in the ED if symptoms worsen before then.  He verbalized understanding.

## 2022-06-16 NOTE — Telephone Encounter (Signed)
chest pain - patient walked in stating that on a scale of 1-10 he would rate his chest pain a 5. States that he is having difficulty in walking without giving out of breath.

## 2022-06-19 DIAGNOSIS — N189 Chronic kidney disease, unspecified: Secondary | ICD-10-CM | POA: Diagnosis not present

## 2022-06-23 ENCOUNTER — Encounter: Payer: PPO | Admitting: Physician Assistant

## 2022-06-23 DIAGNOSIS — E11621 Type 2 diabetes mellitus with foot ulcer: Secondary | ICD-10-CM | POA: Diagnosis not present

## 2022-06-23 DIAGNOSIS — L97512 Non-pressure chronic ulcer of other part of right foot with fat layer exposed: Secondary | ICD-10-CM | POA: Diagnosis not present

## 2022-06-23 NOTE — Progress Notes (Signed)
SALEEM, FUESS ([id number]) [id number] Page 1 of 2 Visit Report for 06/23/2022 Chief Complaint Document Details Patient Name: Date of Service: [name], [name] 06/23/2022 9:45 A M Medical Record Number: [id number] Patient Account Number: [id number] Date of Birth/Sex: Treating RN: 04-Dec-1960 (62 y.o. [name] Provider: Tresa Res Other Clinician: Referring Provider: Treating Provider/Extender: Gary Fleet Weeks in Treatment: 8 Information Obtained from: Patient Chief Complaint Right great callus buildup with concern for underlying ulcer Electronic Signature(s) Signed: 06/23/2022 9:27:18 AM By: Allen Derry PA-C Entered By: Allen Derry on 06/23/2022 09:27:18 -------------------------------------------------------------------------------- Problem List Details Patient Name: Date of Service: [name] 06/23/2022 9:45 A M Medical Record Number: [id number] Patient Account Number: [id number] Date of Birth/Sex: Treating RN: [birth year] (62 y.o. 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

## 2022-06-24 ENCOUNTER — Ambulatory Visit: Payer: PPO | Attending: Nurse Practitioner | Admitting: Nurse Practitioner

## 2022-06-24 VITALS — BP 132/74 | HR 64 | Ht 77.0 in | Wt 259.0 lb

## 2022-06-24 DIAGNOSIS — R55 Syncope and collapse: Secondary | ICD-10-CM

## 2022-06-24 DIAGNOSIS — N184 Chronic kidney disease, stage 4 (severe): Secondary | ICD-10-CM

## 2022-06-24 DIAGNOSIS — R0602 Shortness of breath: Secondary | ICD-10-CM | POA: Diagnosis not present

## 2022-06-24 DIAGNOSIS — R0683 Snoring: Secondary | ICD-10-CM | POA: Diagnosis not present

## 2022-06-24 DIAGNOSIS — E785 Hyperlipidemia, unspecified: Secondary | ICD-10-CM

## 2022-06-24 DIAGNOSIS — R079 Chest pain, unspecified: Secondary | ICD-10-CM | POA: Diagnosis not present

## 2022-06-24 DIAGNOSIS — I10 Essential (primary) hypertension: Secondary | ICD-10-CM

## 2022-06-24 MED ORDER — ISOSORBIDE MONONITRATE ER 30 MG PO TB24: 15.0000 mg | ORAL_TABLET | Freq: Every day | ORAL | 3 refills | Status: DC

## 2022-06-24 NOTE — Progress Notes (Unsigned)
Office Visit    Patient Name: [name] Date of Encounter: 06/24/2022  PCP:  Golden Pop, FNP   [facility]  Cardiologist:  Dina Rich, MD  Advanced Practice Provider:  Sharlene Dory, NP Electrophysiologist:  None   Chief Complaint    [name] is a very pleasant 62 y.o. male with a hx of chest pain, hyperlipidemia, hypertension, type 2 diabetes, history of TIA, bradycardia, CKD stage IV, who presents today for follow-up.   Past Medical History    Past Medical History:  Diagnosis Date   Allergic rhinitis, cause unspecified    Anginal pain (HCC)    Arthritis    BPH (benign prostatic hyperplasia)    Cervicalgia    Chest pain Sept. 2005   multiple caths  /  cath 06/15/2010..normal coronaries,  EF 60%   (false postive nuclear in the past)   CKD (chronic kidney disease), stage III (HCC)    Complication of anesthesia    Hard time waking up patient stated low blood pressures around 2017   Dyslipidemia    mixed   Esophageal reflux    Fibromyalgia    Gastroparesis    Headache(784.0)    History of kidney stones    Hyperlipidemia    Hypertension    Hypoplasia of one kidney    IDDM (insulin dependent diabetes mellitus)    Lumbago    Neuropathy associated with endocrine disorder (HCC)    Overweight(278.02)    Peripheral vascular disease (HCC)    Pulmonary embolus (HCC)    Hx of small left lower lobe  pulmonary embolus   Pulmonary embolus (HCC) 2010ish   Renal insufficiency    Stroke [facility])    mini stroke aug 2016   Type II or unspecified type diabetes mellitus with neurological manifestations, not stated as uncontrolled(250.60)    Urticaria, unspecified    Wears glasses    Past Surgical History:  Procedure Laterality Date   ACHILLES TENDON REPAIR Left 2013   AMPUTATION Left 06/02/2013   Procedure: AMPUTATION 1ST TOE LEFT FOOT;  Surgeon: Dallas Schimke, DPM;  Location: [facility];  Service: Orthopedics;  Laterality:  Left;   AMPUTATION Left 07/21/2013   Procedure: PARTIAL AMPUTATION 2ND TOE LEFT FOOT;  Surgeon: Dallas Schimke, DPM;  Location: [facility];  Service: Podiatry;  Laterality: Left;   AMPUTATION Left 01/12/2019   Procedure: LEFT BELOW KNEE AMPUTATION;  Surgeon: Nadara Mustard, MD;  Location: [facility] OR;  Service: Orthopedics;  Laterality: Left;   ANTERIOR CERVICAL DECOMP/DISCECTOMY FUSION N/A 12/29/2016   Procedure: Cervical five-six Cervical six-seven Anterior cervical discectomy with fusion and plate fixation;  Surgeon: Ditty, Loura Halt, MD;  Location: [facility] OR;  Service: Neurosurgery;  Laterality: N/A;   APPENDECTOMY     BACK SURGERY  1999   x 6 some fusions   BELOW KNEE LEG AMPUTATION Left 01/2019   BIOPSY  12/18/2017   Procedure: BIOPSY;  Surgeon: Malissa Hippo, MD;  Location: [facility];  Service: Endoscopy;;  gastric and esophagus   colonscopy     ESOPHAGOGASTRODUODENOSCOPY (EGD) WITH PROPOFOL N/A 12/18/2017   Procedure: ESOPHAGOGASTRODUODENOSCOPY (EGD) WITH PROPOFOL;  Surgeon: Malissa Hippo, MD;  Location: [facility];  Service: Endoscopy;  Laterality: N/A;  9:25   FOOT ARTHRODESIS Right 01/11/2014   Procedure: ARTHRODESIS INTERPHALANGEAL JOINT HALLUX RIGHT FOOT;  Surgeon: Dallas Schimke, DPM;  Location: [facility];  Service: Podiatry;  Laterality: Right;   HARDWARE REMOVAL Right 01/17/2015  Procedure: HARDWARE REMOVAL;  Surgeon: Ferman Hamming, DPM;  Location: [facility];  Service: Podiatry;  Laterality: Right;   I & D EXTREMITY Left 12/22/2018   Procedure: LEFT PARTIAL CALCANEAL EXCISION;  Surgeon: Nadara Mustard, MD;  Location: [facility] OR;  Service: Orthopedics;  Laterality: Left;   KNEE ARTHROSCOPY Right 10/2015   LUMBAR LAMINECTOMY/DECOMPRESSION MICRODISCECTOMY Right 01/12/2013   Procedure: Right Lumbar Three-Four Laminotomy/Foraminotomy;  Surgeon: Karn Cassis, MD;  Location: [facility];  Service: Neurosurgery;  Laterality: Right;  Right Lumbar Three-Four  Laminotomy/Foraminotomy   NISSEN FUNDOPLICATION     SHOULDER ARTHROSCOPY W/ ROTATOR CUFF REPAIR Right    SHOULDER ARTHROSCOPY WITH DISTAL CLAVICLE RESECTION Right 10/04/2018   Procedure: RIGHT SHOULDER ARTHROSCOPY WITH DISTAL CLAVICLE RESECTION, EXTENSIVE DEBRIDEMENT, SUBACROMIAL PARTIAL ACROMIOPLASTY,;  Surgeon: Frederico Hamman, MD;  Location: [facility];  Service: Orthopedics;  Laterality: Right;  PRE/POST OP SCALENE    Allergies  Allergies  Allergen Reactions   Bee Venom Swelling   Reglan [Metoclopramide] Itching and Rash    History of Present Illness    [name] is a 62 y.o. male with a past medical history as mentioned above.  Last seen by Dr. Dina Rich on September 16, 2021.  Longstanding history of chest pain, history of benign caths.  NST in 2022 was normal, low risk.  Treated for possible microvascular disease with Ranexa, no longer was on Imdur.  Dr. Wyline Mood recommended taking Ranexa 1000 mg twice daily.    Last saw this patient on 04/02/2022 for follow-up. Admitted to palpitations, noticed about 1-2 times per day, usually noted at night, lasted about 15 minutes, then resolved. Denied any chest or other associated symptoms.  Denied any alcohol use, tobacco use, energy use. Did report drinking coffee occasionally.  Reported getting 2-1/2 hours of sleep each night for years.  STOP-BANG score was 6. Arranged monitor - report below.   Today he presents for follow-up. Overall doing well.  Had recent episode of his chest hurting while he was riding a lawnmower,-along mid chest, no radiation, 2 out of 10 in intensity, not bothersome per his report, attributes this to how he was staring the wheel on the lawnmower.  Denies any recurrence in chest pain. Now getting full amount of sleep since starting Melatonin. Denies any shortness of breath, palpitations, syncope, presyncope, dizziness, orthopnea, PND, swelling or significant weight changes, acute bleeding, or  claudication.  EKGs/Labs/Other Studies Reviewed:   The following studies were reviewed today:   EKG:  EKG not ordered today.    Cardiac monitor 04/2022:   7 day monitor   Rare supraventricular ectopy in the form of isolated PACs, couplets. 1 run of SVT lasting 5 beats.   Ventricular ectopy in the form of isolated PVCs   No symptoms reported     Patch Wear Time:  6 days and 19 hours (2024-02-28T11:49:38-0500 to 2024-03-06T07:41:26-0500)   Patient had a min HR of 45 bpm, max HR of 124 bpm, and avg HR of 60 bpm. Predominant underlying rhythm was Sinus Rhythm. First Degree AV Block was present. 1 run of Supraventricular Tachycardia occurred lasting 5 beats with a max rate of 124 bpm (avg 117  bpm). Isolated SVEs were rare (<1.0%), SVE Couplets were rare (<1.0%), and no SVE Triplets were present. Isolated VEs were rare (<1.0%), and no VE Couplets or VE Triplets were present.  Cardiac monitor on 03/17/2021: 2 day monitor Rare supraventricular ectopy in the form of isolated PACs, couplets, triplets. 3 runs of SVT  longest 8 beats Rare ventricular ectopy in the form of isolated PVCs No symptoms reported     Patch Wear Time:  1 days and 23 hours (2023-02-02T08:45:31-0500 to 2023-02-04T08:20:16-0500)   Patient had a min HR of 47 bpm, max HR of 115 bpm, and avg HR of 63 bpm. Predominant underlying rhythm was Sinus Rhythm. First Degree AV Block was present. 3 Supraventricular Tachycardia runs occurred, the run with the fastest interval lasting 8 beats  with a max rate of 113 bpm (avg 105 bpm); the run with the fastest interval was also the longest. Isolated SVEs were rare (<1.0%), SVE Couplets were rare (<1.0%), and SVE Triplets were rare (<1.0%). Isolated VEs were rare (<1.0%), and no VE Couplets or  VE Triplets were present.  Myoview on 04/10/2020: There was no ST segment deviation noted during stress. The study is normal. Inferior defect most consistent with subdiaphragmatic attenuation. Cannot  completely exclude prior mild inferior infarct. Either finding would support low risk. This is a low risk study. The left ventricular ejection fraction is normal (55-65%).  Echo on 03/14/2017: Study Conclusions   - Left ventricle: Inferobasal hypokinesis The cavity size was    normal. There was mild focal basal hypertrophy of the septum.    Systolic function was normal. The estimated ejection fraction was    in the range of 55% to 60%. Wall motion was normal; there were no    regional wall motion abnormalities. Doppler parameters are    consistent with abnormal left ventricular relaxation (grade 1    diastolic dysfunction).  - Mitral valve: Valve area by pressure half-time: 1.55 cm^2.  - Left atrium: The atrium was mildly dilated.   Recent Labs: 09/05/2021: BUN 34; Creat 2.37; Hemoglobin 15.0; Platelets 157; Potassium 4.5; Sodium 139  Recent Lipid Panel    Component Value Date/Time   CHOL 151 03/14/2017 1658   TRIG 111 03/14/2017 1658   HDL 33 (L) 03/14/2017 1658   CHOLHDL 4.6 03/14/2017 1658   VLDL 22 03/14/2017 1658   LDLCALC 96 03/14/2017 1658    Home Medications   Current Meds  Medication Sig   aspirin EC 81 MG tablet Take 1 tablet (81 mg total) by mouth daily. Swallow whole.   atorvastatin (LIPITOR) 40 MG tablet TAKE 1 TABLET BY MOUTH EVERY DAY   carvedilol (COREG) 6.25 MG tablet Take 6.25 mg by mouth 2 (two) times daily.   EPINEPHrine 0.3 mg/0.3 mL IJ SOAJ injection Inject 0.3 mg into the muscle as needed for anaphylaxis.   gabapentin (NEURONTIN) 600 MG tablet 1 tab by mouth every morning & 3 tabs at bedtime   glipiZIDE (GLUCOTROL) 10 MG tablet Take 20 mg by mouth daily.   insulin detemir (LEVEMIR) 100 UNIT/ML injection Inject 55 Units into the skin at bedtime.   Lancets (ONETOUCH DELICA PLUS LANCET33G) MISC daily.   nitroGLYCERIN (NITROSTAT) 0.4 MG SL tablet Place 1 tablet (0.4 mg total) under the tongue every 5 (five) minutes as needed for chest pain.   NOVOLOG FLEXPEN  100 UNIT/ML FlexPen Inject into the skin as directed. Per sliding scale   ONETOUCH ULTRA test strip USE TO TEST BLOOD SUGAR THREE TIMES DAILY   ranolazine (RANEXA) 1000 MG SR tablet TAKE 1 TABLET BY MOUTH TWICE DAILY   sodium bicarbonate 650 MG tablet Take 650 mg by mouth 2 (two) times daily.      Review of Systems    All other systems reviewed and are otherwise negative except as noted above.  Physical Exam  VS:  BP 132/74   Pulse 64   Ht 6\' 5"  (1.956 m)   Wt 259 lb (117.5 kg)   SpO2 98%   BMI 30.71 kg/m  , BMI Body mass index is 30.71 kg/m.  Wt Readings from Last 3 Encounters:  06/24/22 259 lb (117.5 kg)  05/30/22 279 lb 3.2 oz (126.6 kg)  04/02/22 262 lb (118.8 kg)     GEN: Obese, 62 y.o. male in no acute distress. HEENT: normal. Neck: Supple, no JVD, carotid bruits, or masses. Cardiac: S1/S2, RRR, no murmurs, rubs, or gallops. No clubbing, cyanosis, edema.  Radials/ Right PT 2+.  Respiratory:  Respirations regular and unlabored, clear to auscultation bilaterally. GI: Soft, nontender, nondistended. MS: L BKA, no swelling. Skin: Warm and dry, no rash. Neuro:  Strength and sensation are intact. Psych: Normal affect.  Assessment & Plan     Atypical chest pain Limited one-time episode of chest pain while riding lawnmower, attributes this to how he was steering steering wheel.  Denies any recurrence since.  Will continue to monitor for now.  No indication for ischemic evaluation at this time.  Continue current medication regimen.  ED precautions discussed. Heart healthy diet and regular cardiovascular exercise encouraged.   HTN BP stable. Discussed to monitor BP at home at least 2 hours after medications and sitting for 5-10 minutes. Continue current medication regimen. Heart healthy diet and regular cardiovascular exercise encouraged.   HLD, medication management LDL 10/2021 was 113. Continue atorvastatin.  Discussed/encouraged Mediterranean diet. Will obtain repeat  labs (FLP and LFT) prior to next OV to see if atorvastatin dosage needs to be increased. Heart healthy diet and regular cardiovascular exercise encouraged. Continue to follow with PCP.  4. CKD Stage IV Last sCr was stable at 2.48 with eGFR at 29. Avoid nephrotoxic agents. Has upcoming labs with Nephrology - have requested that this is faxed to our office. Encouraged adequate hydration. Continue f/u with Nephrology and PCP.   5. Snoring Stop Bang score 6. Needs an sleep study done to be performed in sleep clinic. Will re-arrange Pulmonary referral. Continue to follow with PCP.   Disposition: Follow up in 6 months with Dina Rich, MD or APP.  Imdur 15 mg daily Echo  No labs 6-8 week follow-up    Signed, Sharlene Dory, NP 06/24/2022, 4:10 PM [facility]

## 2022-06-24 NOTE — Patient Instructions (Addendum)
Medication Instructions:  Your physician has recommended you make the following change in your medication:   -Start Imdur 15 mg tablet once daily.   *If you need a refill on your cardiac medications before your next appointment, please call your pharmacy*   Lab Work: Noen If you have labs (blood work) drawn today and your tests are completely normal, you will receive your results only by: MyChart Message (if you have MyChart) OR A paper copy in the mail If you have any lab test that is abnormal or we need to change your treatment, we will call you to review the results.   Testing/Procedures: Your physician has requested that you have an echocardiogram. Echocardiography is a painless test that uses sound waves to create images of your heart. It provides your doctor with information about the size and shape of your heart and how well your heart's chambers and valves are working. This procedure takes approximately one hour. There are no restrictions for this procedure. Please do NOT wear cologne, perfume, aftershave, or lotions (deodorant is allowed). Please arrive 15 minutes prior to your appointment time.    Follow-Up: At [facility], you and your health needs are our priority.  As part of our continuing mission to provide you with exceptional heart care, we have created designated Provider Care Teams.  These Care Teams include your primary Cardiologist (physician) and Advanced Practice Providers (APPs -  Physician Assistants and Nurse Practitioners) who all work together to provide you with the care you need, when you need it.  We recommend signing up for the patient portal called "MyChart".  Sign up information is provided on this After Visit Summary.  MyChart is used to connect with patients for Virtual Visits (Telemedicine).  Patients are able to view lab/test results, encounter notes, upcoming appointments, etc.  Non-urgent messages can be sent to your provider as well.   To  learn more about what you can do with MyChart, go to [email or web address].    Your next appointment:   6-8 week(s)  Provider:   Sharlene Dory, NP   Other Instructions

## 2022-06-27 ENCOUNTER — Encounter: Payer: PPO | Admitting: Internal Medicine

## 2022-06-27 DIAGNOSIS — L97512 Non-pressure chronic ulcer of other part of right foot with fat layer exposed: Secondary | ICD-10-CM | POA: Diagnosis not present

## 2022-06-27 DIAGNOSIS — E11621 Type 2 diabetes mellitus with foot ulcer: Secondary | ICD-10-CM | POA: Diagnosis not present

## 2022-06-27 DIAGNOSIS — S80811A Abrasion, right lower leg, initial encounter: Secondary | ICD-10-CM | POA: Diagnosis not present

## 2022-07-01 ENCOUNTER — Encounter: Payer: PPO | Admitting: Physician Assistant

## 2022-07-01 DIAGNOSIS — L97512 Non-pressure chronic ulcer of other part of right foot with fat layer exposed: Secondary | ICD-10-CM | POA: Diagnosis not present

## 2022-07-01 DIAGNOSIS — S80811A Abrasion, right lower leg, initial encounter: Secondary | ICD-10-CM | POA: Diagnosis not present

## 2022-07-01 DIAGNOSIS — E11621 Type 2 diabetes mellitus with foot ulcer: Secondary | ICD-10-CM | POA: Diagnosis not present

## 2022-07-01 NOTE — Progress Notes (Signed)
KAIEA, LEWEY ([id number]) [id number] Page 1 of 1 Visit Report for 07/01/2022 Problem List Details Patient Name: Date of Service: [name], [name] 07/01/2022 7:30 A M Medical Record Number: [id number] Patient Account Number: [id number] Date of Birth/Sex: Treating RN: 1960/08/23 (62 y.o. Roel Cluck Primary Care Provider: Tresa Res Other Clinician: Betha Loa Referring Provider: Treating Provider/Extender: Gary Fleet Weeks in Treatment: 9 Active Problems ICD-10 Encounter Code Description Active Date MDM Diagnosis E11.621 Type 2 diabetes mellitus with foot ulcer 04/28/2022 No Yes L97.512 Non-pressure chronic ulcer of other part of right foot with fat 04/28/2022 No Yes layer exposed S80.811S Abrasion, right lower leg, sequela 06/27/2022 No Yes L84 Corns and callosities 04/28/2022 No Yes N18.30 Chronic kidney disease, stage 3 unspecified 04/28/2022 No Yes I10 Essential (primary) hypertension 04/28/2022 No Yes Z89.512 Acquired absence of left leg below knee 04/28/2022 No Yes Inactive Problems Resolved Problems Electronic Signature(s) Signed: 07/01/2022 7:58:40 AM By: Allen Derry PA-C Entered By: Allen Derry on 07/01/2022 07:58:39

## 2022-07-03 ENCOUNTER — Ambulatory Visit: Payer: PPO | Admitting: Physician Assistant

## 2022-07-03 DIAGNOSIS — N184 Chronic kidney disease, stage 4 (severe): Secondary | ICD-10-CM | POA: Diagnosis not present

## 2022-07-03 DIAGNOSIS — N2581 Secondary hyperparathyroidism of renal origin: Secondary | ICD-10-CM | POA: Diagnosis not present

## 2022-07-03 DIAGNOSIS — R809 Proteinuria, unspecified: Secondary | ICD-10-CM | POA: Diagnosis not present

## 2022-07-03 DIAGNOSIS — E1122 Type 2 diabetes mellitus with diabetic chronic kidney disease: Secondary | ICD-10-CM | POA: Diagnosis not present

## 2022-07-08 ENCOUNTER — Encounter: Payer: PPO | Attending: Physician Assistant | Admitting: Physician Assistant

## 2022-07-08 DIAGNOSIS — Z89512 Acquired absence of left leg below knee: Secondary | ICD-10-CM | POA: Diagnosis not present

## 2022-07-08 DIAGNOSIS — N183 Chronic kidney disease, stage 3 unspecified: Secondary | ICD-10-CM | POA: Insufficient documentation

## 2022-07-08 DIAGNOSIS — L84 Corns and callosities: Secondary | ICD-10-CM | POA: Insufficient documentation

## 2022-07-08 DIAGNOSIS — S80811A Abrasion, right lower leg, initial encounter: Secondary | ICD-10-CM | POA: Diagnosis not present

## 2022-07-08 DIAGNOSIS — E11621 Type 2 diabetes mellitus with foot ulcer: Secondary | ICD-10-CM | POA: Insufficient documentation

## 2022-07-08 DIAGNOSIS — I129 Hypertensive chronic kidney disease with stage 1 through stage 4 chronic kidney disease, or unspecified chronic kidney disease: Secondary | ICD-10-CM | POA: Insufficient documentation

## 2022-07-08 DIAGNOSIS — E1122 Type 2 diabetes mellitus with diabetic chronic kidney disease: Secondary | ICD-10-CM | POA: Diagnosis not present

## 2022-07-08 DIAGNOSIS — L97512 Non-pressure chronic ulcer of other part of right foot with fat layer exposed: Secondary | ICD-10-CM | POA: Diagnosis not present

## 2022-07-14 ENCOUNTER — Encounter: Payer: PPO | Admitting: Physician Assistant

## 2022-07-14 DIAGNOSIS — E11621 Type 2 diabetes mellitus with foot ulcer: Secondary | ICD-10-CM | POA: Diagnosis not present

## 2022-07-14 DIAGNOSIS — I129 Hypertensive chronic kidney disease with stage 1 through stage 4 chronic kidney disease, or unspecified chronic kidney disease: Secondary | ICD-10-CM | POA: Diagnosis not present

## 2022-07-14 DIAGNOSIS — L84 Corns and callosities: Secondary | ICD-10-CM | POA: Diagnosis not present

## 2022-07-14 NOTE — Progress Notes (Signed)
ALVIA, MARK ([id number]) [id number] Page 1 of 6 Visit Report for 07/14/2022 Chief Complaint Document Details Patient Name: Date of Service: [name], [name] 07/14/2022 10:15 A M Medical Record Number: [id number] Patient Account Number: [id number] Date of Birth/Sex: Treating RN: May 30, 1960 (62 y.o. [name] Provider: Tresa Res Other Clinician: Referring Provider: Treating Provider/Extender: Gary Fleet Weeks in Treatment: 11 Information Obtained from: Patient Chief Complaint Right great callus buildup with concern for underlying ulcer Electronic Signature(s) Signed: 07/14/2022 10:14:08 AM By: Allen Derry PA-C Entered By: Allen Derry on 07/14/2022 10:14:08 -------------------------------------------------------------------------------- HPI Details Patient Name: Date of Service: [name]. 07/14/2022 10:15 A M Medical Record Number: [id number] Patient Account Number: [id number] Date of Birth/Sex: Treating RN: 08-Jul-1960 (62 y.o. [name] Provider: Tresa Res Other Clinician: Referring Provider: Treating Provider/Extender: Gary Fleet Weeks in Treatment: 11 History of Present Illness HPI Description: 11-07-2021 upon evaluation today patient appears to be doing somewhat poorly in regard to a wound on his right plantar great toe. Fortunately there does not appear to be any evidence of active infection locally or systemically at this time which is great news. No fevers, chills, nausea, vomiting, or diarrhea. With that being said he tells me that he really has not been noticing a lot of improvement either with regard to the treatment. He has been under the care of Dr. Loreta Ave up to this point for roughly a year. More recently he did have an MRI which revealed the potential for possible osteomyelitis. With that being said I do believe that he definitely should  probably be monitored for this but to be honest the [name] that he was given probably did a good job. Considering there was not much being seen on MRI I do not think there is a high likelihood of severe osteomyelitis at this point we will definitely keep an eye on things however. Nonetheless right now has been utilizing Silvadene cream I definitely think we can find something better to help get this moving in the right direction. Patient does have a history of diabetes mellitus type 2, chronic kidney disease stage III, hypertension, and again he has had an amputation of the left leg currently. The right leg is the 1 that we are actually treating the great toe wound on. He has a below-knee amputation. 11-11-2021 upon evaluation today patient's wound actually is showing signs of excellent improvement. I am actually very pleased even compared to last week this is already better. He is very happy as well. 12-02-2021 upon evaluation today patient appears to be doing pretty well in regard to his toe ulcer this been several weeks since I saw him part of it was our scheduling with me being out of town and part of it was he actually missed an appointment secondary to his trunk the will actually fell off and he had some issues there getting it fixed on Monday so he missed his appointment last Monday. Nonetheless the good news is in today things appear to be doing well no signs of infection though he does have a pretty good need for sharp debridement to try to clear some of this away. 11/6; right first toes ulcer complicated by his left-sided BKA.Marland KitchenNo major change from last visit 12-16-2021 upon evaluation today patient appears to be doing well currently in regard to his toe ulcer that was not making a good amount of improvement yet I do believe the total contact cast is really good  to be necessary. I talked about that before he tells me his wife can drive him around he is done with working now also he can  actually look towards doing this. 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 12-30-2021 upon evaluation today patient appears to be doing well currently in regard to his foot ulcer on the toe although he is still building up quite a bit of callus. Will actually get the total contact cast on him today and started which I think is good to be very beneficial. 01-02-2022 upon evaluation today patient appears to be doing much better in regard to his wound. The cast does seem to do excellent for him. Fortunately there does not appear to be any evidence of active infections at this time. 01-09-2022 upon evaluation today patient appears to be doing excellent in regard to his wound. He is actually showing signs of excellent improvement and in general I think were very close to complete resolution. In fact there might be just a really tiny area, 0.1, still open but again this is much better than where we were prior to put the cast on. Its only been a little over a week since we put the first cast in place. 01-17-2022 upon 11 valuation today patient appears to be doing well currently in regard to his toe ulcer. In fact he appears to be completely healed based on what I am seeing at this point. I do not see any evidence of anything open. Readmission: KIAEEM, GOOLD ([id number]) [id number] Page 2 of 6 04-28-2022 upon evaluation today patient presents for reevaluation here in the clinic I actually saw him last January 17, 2022. At that point he was completely healed. With that being said unfortunately he notes that he has been having some callus buildup in regard to the toe and he figured he should come in and get this checked out sooner rather than later. This is the medial aspect of the right great toe again he has a below-knee amputation on the left. Fortunately I do  not see any signs of infection there is a lot of callus that was not definitively open when I first saw this but we did perform debridement to clear this away that we detailed in the objective section. 05-05-2022 upon evaluation today patient appears to be doing well currently in regard to his wound. I do not think this is getting much worse he still has some callus buildup but he is supposed to be getting his new diabetic shoes shortly which I think is can help a lot with this. Fortunately I do not see any signs of active infection locally or systemically which is great news. No fevers, chills, nausea, vomiting, or diarrhea. 05-12-2022 upon evaluation today patient appears to be doing well with regard to his toe ulcer. This is actually showing signs of improvement which is great news. Fortunately I do not see any signs of active infection locally nor systemically at this time which is great news. No fevers, chills, nausea, vomiting, or diarrhea. 05-19-2022 upon evaluation today patient appears to be doing well currently in regard to his wound although it is really not much better it is about the same. Fortunately I do not see any signs of active infection locally or systemically which is great news. No fevers, chills, nausea, vomiting, or diarrhea. 05-26-2022 upon evaluation today patient  appears to be doing poorly in regard to his toe ulcer this is still not doing quite as well as what we like to see. Fortunately I do not see any signs of active infection locally or systemically which is good news. With that being said he does have evidence of callus buildup and the wound is still open underneath I think this is going to probably end up being a little bit deeper in fact compared to what it was previous. 06-02-2022 upon evaluation today patient appears to be doing well currently in regard to his toe ulcer we are actually considering a total contact cast but he actually seems to be doing quite well at this  point. Fortunately I do not see any need for initiating the cast although that something we will continue to monitor for. He is in agreement the plan has been try to pad it more and stay off of it more as well. 06-16-2022 upon evaluation today patient appears to be doing poorly in regard to his great toe. We really need to see about getting him started on antibiotics I do believe as quickly as possible. Also think that he probably is going require total contact cast but we need to get the infection under control. I am going to send for a PCR culture as quickly as possible today to get this result back and make sure that we have him on the right antibiotics he is in agreement with that plan. 06-23-2022 upon evaluation today patient appears to be doing a little better though still not great in regard to the wound on his toe. I do believe that he would benefit from a total contact cast we discussed that today and I do think that we should continue with that plan. He is in agreement with that plan as well. 5/24; first TCC check on the right great toe wound. Unfortunately the patient felt some discomfort yesterday in his right mid tibia. He arrived in clinic today with a superficial wound in this area. This was not just friction rubbing. Furthermore there was fibrinous slough on the surface suggesting that this had been there more than 1 day. FORTUNATELY the right great toe wound actually looks quite good we have been using Hydrofera Blue in this area 07-01-2022 upon evaluation today patient appears to be doing well currently in regard to his toe ulcer. He has been tolerating the dressing changes without complication. Fortunately there does not appear to be any signs of active infection at this time which is great news. No fevers, chills, nausea, vomiting, or diarrhea. 07-08-2022 upon evaluation patient's toe ulcer actually appears to be healed. With that being said unfortunately though I would like to I do not  have a cast to keep it in for the next week. He is can have to make sure to stay off of this as much as he can and take it easy so it does not reopen. We just were completely out of cast and have not come in yet. 07-14-2022 upon evaluation today patient actually appears to be still completely healed in regard to the toe and his leg is also healed at this point. Overall he has done extremely well. Electronic Signature(s) Signed: 07/14/2022 5:58:31 PM By: Allen Derry PA-C Entered By: Allen Derry on 07/14/2022 17:58:30 -------------------------------------------------------------------------------- Physical Exam Details Patient Name: Date of Service: [name], [name] 07/14/2022 10:15 A M Medical Record Number: [id number] Patient Account Number: [id number] Date of Birth/Sex: Treating RN: 10-11-1960 (62 y.o. Roselle Locus,  Caitlin Primary Care Provider: Tresa Res Other Clinician: Referring Provider: Treating Provider/Extender: Gary Fleet Weeks in Treatment: 11 Constitutional Well-nourished and well-hydrated in no acute distress. Respiratory normal breathing without difficulty. Psychiatric this patient is able to make decisions and demonstrates good insight into disease process. Alert and Oriented x 3. pleasant and cooperative. Notes Upon inspection patient's wound bed actually showed signs again of complete epithelization in both locations I think he is ready for discharge she is using a protective dressing over the toe I am going to have him continue to do this ongoing. Electronic Signature(s) Signed: 07/14/2022 5:58:43 PM By: Allen Derry PA-C Entered By: Allen Derry on 07/14/2022 17:58:43 Constancio, [name] ([id number]) [id number] Page 3 of 6 -------------------------------------------------------------------------------- Physician Orders Details Patient Name: Date of Service: SAXTON, SWIERK 07/14/2022 10:15 A M Medical Record Number:  [id number] Patient Account Number: [id number] Date of Birth/Sex: Treating RN: Apr 28, 1960 (62 y.o. [name] Provider: Tresa Res Other Clinician: Referring Provider: Treating Provider/Extender: Gary Fleet Weeks in Treatment: 11 Verbal / Phone Orders: No Diagnosis Coding ICD-10 Coding Code Description E11.621 Type 2 diabetes mellitus with foot ulcer L97.512 Non-pressure chronic ulcer of other part of right foot with fat layer exposed S80.811S Abrasion, right lower leg, sequela L84 Corns and callosities N18.30 Chronic kidney disease, stage 3 unspecified I10 Essential (primary) hypertension Z89.512 Acquired absence of left leg below knee Discharge From [facility] Discharge from [facility] Treatment Complete - Please call with any issues to healed wounds. Please call us with any issues. Keep protective pad on during the day and lotion at night. Electronic Signature(s) Signed: 07/14/2022 3:58:13 PM By: Angelina Pih Signed: 07/15/2022 6:26:43 PM By: Allen Derry PA-C Entered By: Angelina Pih on 07/14/2022 15:58:13 -------------------------------------------------------------------------------- Problem List Details Patient Name: Date of Service: [name] 07/14/2022 10:15 A M Medical Record Number: [id number] Patient Account Number: [id number] Date of Birth/Sex: Treating RN: April 21, 1960 (62 y.o. [name] Provider: Tresa Res Other Clinician: Referring Provider: Treating Provider/Extender: Gary Fleet Weeks in Treatment: 11 Active Problems ICD-10 Encounter Code Description Active Date MDM Diagnosis E11.621 Type 2 diabetes mellitus with foot ulcer 04/28/2022 No Yes L97.512 Non-pressure chronic ulcer of other part of right foot with fat layer exposed 04/28/2022 No Yes S80.811S Abrasion, right lower leg, sequela 06/27/2022 No Yes L84 Corns and callosities 04/28/2022 No Yes N18.30  Chronic kidney disease, stage 3 unspecified 04/28/2022 No Yes I10 Essential (primary) hypertension 04/28/2022 No Yes Waldorf, Arkeem L ([id number]) [id number] Page 4 of 6 [phone] Acquired absence of left leg below knee 04/28/2022 No Yes Inactive Problems Resolved Problems Electronic Signature(s) Signed: 07/14/2022 10:14:04 AM By: Allen Derry PA-C Entered By: Allen Derry on 07/14/2022 10:14:04 -------------------------------------------------------------------------------- Progress Note Details Patient Name: Date of Service: [name] 07/14/2022 10:15 A M Medical Record Number: [id number] Patient Account Number: [id number] Date of Birth/Sex: Treating RN: 1960/05/18 (62 y.o. [name] Provider: Tresa Res Other Clinician: Referring Provider: Treating Provider/Extender: Gary Fleet Weeks in Treatment: 11 Subjective Chief Complaint Information obtained from Patient Right great callus buildup with concern for underlying ulcer History of Present Illness (HPI) 11-07-2021 upon evaluation today patient appears to be doing somewhat poorly in regard to a wound on his right plantar great toe. Fortunately there does not appear to be any evidence of active infection locally or systemically at this time which is great news. No fevers, chills, nausea, vomiting, or diarrhea. With  that being said he tells me that he really has not been noticing a lot of improvement either with regard to the treatment. He has been under the care of Dr. Loreta Ave up to this point for roughly a year. More recently he did have an MRI which revealed the potential for possible osteomyelitis. With that being said I do believe that he definitely should probably be monitored for this but to be honest the [name] that he was given probably did a good job. Considering there was not much being seen on MRI I do not think there is a high likelihood of severe  osteomyelitis at this point we will definitely keep an eye on things however. Nonetheless right now has been utilizing Silvadene cream I definitely think we can find something better to help get this moving in the right direction. Patient does have a history of diabetes mellitus type 2, chronic kidney disease stage III, hypertension, and again he has had an amputation of the left leg currently. The right leg is the 1 that we are actually treating the great toe wound on. He has a below-knee amputation. 11-11-2021 upon evaluation today patient's wound actually is showing signs of excellent improvement. I am actually very pleased even compared to last week this is already better. He is very happy as well. 12-02-2021 upon evaluation today patient appears to be doing pretty well in regard to his toe ulcer this been several weeks since I saw him part of it was our scheduling with me being out of town and part of it was he actually missed an appointment secondary to his trunk the will actually fell off and he had some issues there getting it fixed on Monday so he missed his appointment last Monday. Nonetheless the good news is in today things appear to be doing well no signs of infection though he does have a pretty good need for sharp debridement to try to clear some of this away. 11/6; right first toes ulcer complicated by his left-sided BKA.Marland KitchenNo major change from last visit 12-16-2021 upon evaluation today patient appears to be doing well currently in regard to his toe ulcer that was not making a good amount of improvement yet I do believe the total contact cast is really good to be necessary. I talked about that before he tells me his wife can drive him around he is done with working now also he can actually look towards doing this. 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 12-30-2021 upon  evaluation today patient appears to be doing well currently in regard to his foot ulcer on the toe although he is still building up quite a bit of callus. Will actually get the total contact cast on him today and started which I think is good to be very beneficial. 01-02-2022 upon evaluation today patient appears to be doing much better in regard to his wound. The cast does seem to do excellent for him. Fortunately there does not appear to be any evidence of active infections at this time. 01-09-2022 upon evaluation today patient appears to be doing excellent in regard to his wound. He is actually showing signs of excellent improvement and in general I think were very close to complete resolution. In fact there might be just a really tiny area, 0.1, still open but again this is much better than where we were  prior to put the cast on. Its only been a little over a week since we put the first cast in place. 01-17-2022 upon 11 valuation today patient appears to be doing well currently in regard to his toe ulcer. In fact he appears to be completely healed based on what I am seeing at this point. I do not see any evidence of anything open. Readmission: 04-28-2022 upon evaluation today patient presents for reevaluation here in the clinic I actually saw him last January 17, 2022. At that point he was completely healed. With that being said unfortunately he notes that he has been having some callus buildup in regard to the toe and he figured he should come in and get this checked out sooner rather than later. This is the medial aspect of the right great toe again he has a below-knee amputation on the left. Fortunately I do not see any signs of infection there is a lot of callus that was not definitively open when I first saw this but we did perform debridement to clear this away that we detailed in the objective section. 05-05-2022 upon evaluation today patient appears to be doing well currently in regard to his  wound. I do not think this is getting much worse he still has some Koopman, Rydan L ([id number]) [phone].pdf Page 5 of 6 callus buildup but he is supposed to be getting his new diabetic shoes shortly which I think is can help a lot with this. Fortunately I do not see any signs of active infection locally or systemically which is great news. No fevers, chills, nausea, vomiting, or diarrhea. 05-12-2022 upon evaluation today patient appears to be doing well with regard to his toe ulcer. This is actually showing signs of improvement which is great news. Fortunately I do not see any signs of active infection locally nor systemically at this time which is great news. No fevers, chills, nausea, vomiting, or diarrhea. 05-19-2022 upon evaluation today patient appears to be doing well currently in regard to his wound although it is really not much better it is about the same. Fortunately I do not see any signs of active infection locally or systemically which is great news. No fevers, chills, nausea, vomiting, or diarrhea. 05-26-2022 upon evaluation today patient appears to be doing poorly in regard to his toe ulcer this is still not doing quite as well as what we like to see. Fortunately I do not see any signs of active infection locally or systemically which is good news. With that being said he does have evidence of callus buildup and the wound is still open underneath I think this is going to probably end up being a little bit deeper in fact compared to what it was previous. 06-02-2022 upon evaluation today patient appears to be doing well currently in regard to his toe ulcer we are actually considering a total contact cast but he actually seems to be doing quite well at this point. Fortunately I do not see any need for initiating the cast although that something we will continue to monitor for. He is in agreement the plan has been try to pad it more and stay off of it more as  well. 06-16-2022 upon evaluation today patient appears to be doing poorly in regard to his great toe. We really need to see about getting him started on antibiotics I do believe as quickly as possible. Also think that he probably is going require total contact cast but we need to get the  infection under control. I am going to send for a PCR culture as quickly as possible today to get this result back and make sure that we have him on the right antibiotics he is in agreement with that plan. 06-23-2022 upon evaluation today patient appears to be doing a little better though still not great in regard to the wound on his toe. I do believe that he would benefit from a total contact cast we discussed that today and I do think that we should continue with that plan. He is in agreement with that plan as well. 5/24; first TCC check on the right great toe wound. Unfortunately the patient felt some discomfort yesterday in his right mid tibia. He arrived in clinic today with a superficial wound in this area. This was not just friction rubbing. Furthermore there was fibrinous slough on the surface suggesting that this had been there more than 1 day. FORTUNATELY the right great toe wound actually looks quite good we have been using Hydrofera Blue in this area 07-01-2022 upon evaluation today patient appears to be doing well currently in regard to his toe ulcer. He has been tolerating the dressing changes without complication. Fortunately there does not appear to be any signs of active infection at this time which is great news. No fevers, chills, nausea, vomiting, or diarrhea. 07-08-2022 upon evaluation patient's toe ulcer actually appears to be healed. With that being said unfortunately though I would like to I do not have a cast to keep it in for the next week. He is can have to make sure to stay off of this as much as he can and take it easy so it does not reopen. We just were completely out of cast and have not come  in yet. 07-14-2022 upon evaluation today patient actually appears to be still completely healed in regard to the toe and his leg is also healed at this point. Overall he has done extremely well. Objective Constitutional Well-nourished and well-hydrated in no acute distress. Vitals Time Taken: 10:23 AM, Height: 77 in, Weight: 257 lbs, BMI: 30.5, Temperature: 98.3 F, Pulse: 59 bpm, Respiratory Rate: 18 breaths/min, Blood Pressure: 145/76 mmHg. Respiratory normal breathing without difficulty. Psychiatric this patient is able to make decisions and demonstrates good insight into disease process. Alert and Oriented x 3. pleasant and cooperative. General Notes: Upon inspection patient's wound bed actually showed signs again of complete epithelization in both locations I think he is ready for discharge she is using a protective dressing over the toe I am going to have him continue to do this ongoing. Integumentary (Hair, Skin) Wound #3 status is Healed - Epithelialized. Original cause of wound was Gradually Appeared. The date acquired was: 06/27/2022. The wound has been in treatment 2 weeks. The wound is located on the Right,Anterior Lower Leg. The wound measures 0cm length x 0cm width x 0cm depth; 0cm^2 area and 0cm^3 volume. There is no tunneling or undermining noted. There is a none present amount of drainage noted. There is no granulation within the wound bed. There is no necrotic tissue within the wound bed. Assessment Active Problems ICD-10 Type 2 diabetes mellitus with foot ulcer Non-pressure chronic ulcer of other part of right foot with fat layer exposed Abrasion, right lower leg, sequela Corns and callosities Chronic kidney disease, stage 3 unspecified Essential (primary) hypertension Acquired absence of left leg below knee Vannatter, Dale L ([id number]) [id number] Page 6 of 6 Plan Discharge From [facility] Services: Discharge from Wound Care Center Treatment  Complete - Please call with any issues to healed wounds. Please call us with any issues. Keep protective pad on during the day and lotion at night. 1. I would recommend that we have the patient continue to monitor for any evidence of infection or worsening. Overall I think that we are really making some excellent progress here and I am hopeful that he will continue to maintain closure. He thinks that he has a solid shot as long as he keeps with the protection. 2. I am going to recommend as well that he should continue to keep a close eye on the toe each and every night. I will see the better he does as far as keeping an eye on this the better chance he has had keeping it from causing a significant issue in the future. We will see the patient back for follow-up visit as needed. Electronic Signature(s) Signed: 07/14/2022 6:01:32 PM By: Allen Derry PA-C Entered By: Allen Derry on 07/14/2022 18:01:32 -------------------------------------------------------------------------------- SuperBill Details Patient Name: Date of Service: [name] 07/14/2022 Medical Record Number: [id number] Patient Account Number: [id number] Date of Birth/Sex: Treating RN: 1960-12-22 (62 y.o. [name] Provider: Tresa Res Other Clinician: Referring Provider: Treating Provider/Extender: Gary Fleet Weeks in Treatment: 11 Diagnosis Coding ICD-10 Codes Code Description E11.621 Type 2 diabetes mellitus with foot ulcer L97.512 Non-pressure chronic ulcer of other part of right foot with fat layer exposed S80.811S Abrasion, right lower leg, sequela L84 Corns and callosities N18.30 Chronic kidney disease, stage 3 unspecified I10 Essential (primary) hypertension Z89.512 Acquired absence of left leg below knee Facility Procedures : CPT4 Code: 40981191 Description: 47829 - WOUND CARE VISIT-LEV 2 EST PT Modifier: Quantity: 1 Physician Procedures : CPT4 Code Description  Modifier [id number] - WC PHYS LEVEL 3 - EST PT ICD-10 Diagnosis Description E11.621 Type 2 diabetes mellitus with foot ulcer L97.512 Non-pressure chronic ulcer of other part of right foot with fat layer exposed S80.811S  Abrasion, right lower leg, sequela L84 Corns and callosities Quantity: 1 Electronic Signature(s) Signed: 07/14/2022 6:02:15 PM By: Allen Derry PA-C Previous Signature: 07/14/2022 3:58:40 PM Version By: Angelina Pih Entered By: Allen Derry on 07/14/2022 18:02:15

## 2022-07-15 ENCOUNTER — Ambulatory Visit: Payer: PPO | Admitting: Physician Assistant

## 2022-08-06 DIAGNOSIS — Z299 Encounter for prophylactic measures, unspecified: Secondary | ICD-10-CM | POA: Diagnosis not present

## 2022-08-06 DIAGNOSIS — G4700 Insomnia, unspecified: Secondary | ICD-10-CM | POA: Diagnosis not present

## 2022-08-06 DIAGNOSIS — E1165 Type 2 diabetes mellitus with hyperglycemia: Secondary | ICD-10-CM | POA: Diagnosis not present

## 2022-08-06 DIAGNOSIS — R251 Tremor, unspecified: Secondary | ICD-10-CM | POA: Diagnosis not present

## 2022-08-06 DIAGNOSIS — I739 Peripheral vascular disease, unspecified: Secondary | ICD-10-CM | POA: Diagnosis not present

## 2022-08-12 ENCOUNTER — Ambulatory Visit (HOSPITAL_COMMUNITY)
Admission: RE | Admit: 2022-08-12 | Discharge: 2022-08-12 | Disposition: A | Discharge status: Home / Self Care | Payer: PPO | Source: Ambulatory Visit | Attending: Nurse Practitioner | Admitting: Nurse Practitioner

## 2022-08-12 DIAGNOSIS — R0602 Shortness of breath: Secondary | ICD-10-CM | POA: Diagnosis not present

## 2022-08-12 LAB — ECHOCARDIOGRAM COMPLETE
Area-P 1/2: 2.42 cm2
S' Lateral: 2.7 cm

## 2022-08-12 NOTE — Progress Notes (Signed)
*  PRELIMINARY RESULTS* Echocardiogram 2D Echocardiogram has been performed.  [name] 08/12/2022, 9:15 AM

## 2022-08-14 ENCOUNTER — Other Ambulatory Visit: Payer: Self-pay | Admitting: Nurse Practitioner

## 2022-08-14 ENCOUNTER — Telehealth: Payer: Self-pay | Admitting: Cardiology

## 2022-08-14 ENCOUNTER — Ambulatory Visit: Payer: PPO | Admitting: Nurse Practitioner

## 2022-08-14 MED ORDER — ISOSORBIDE MONONITRATE ER 30 MG PO TB24: 30.0000 mg | ORAL_TABLET | Freq: Every day | ORAL | 3 refills | Status: DC

## 2022-08-14 NOTE — Telephone Encounter (Signed)
Patient states he was returning call. Please advise ?

## 2022-08-14 NOTE — Telephone Encounter (Signed)
Gave patient results and care plan from yesterday.

## 2022-08-20 DIAGNOSIS — E1122 Type 2 diabetes mellitus with diabetic chronic kidney disease: Secondary | ICD-10-CM | POA: Diagnosis not present

## 2022-08-20 DIAGNOSIS — N183 Chronic kidney disease, stage 3 unspecified: Secondary | ICD-10-CM | POA: Diagnosis not present

## 2022-08-20 DIAGNOSIS — I10 Essential (primary) hypertension: Secondary | ICD-10-CM | POA: Diagnosis not present

## 2022-08-20 DIAGNOSIS — Z299 Encounter for prophylactic measures, unspecified: Secondary | ICD-10-CM | POA: Diagnosis not present

## 2022-08-20 DIAGNOSIS — E1140 Type 2 diabetes mellitus with diabetic neuropathy, unspecified: Secondary | ICD-10-CM | POA: Diagnosis not present

## 2022-08-29 DIAGNOSIS — N184 Chronic kidney disease, stage 4 (severe): Secondary | ICD-10-CM | POA: Diagnosis not present

## 2022-08-29 DIAGNOSIS — E1122 Type 2 diabetes mellitus with diabetic chronic kidney disease: Secondary | ICD-10-CM | POA: Diagnosis not present

## 2022-08-29 DIAGNOSIS — D696 Thrombocytopenia, unspecified: Secondary | ICD-10-CM | POA: Diagnosis not present

## 2022-08-29 DIAGNOSIS — N200 Calculus of kidney: Secondary | ICD-10-CM | POA: Diagnosis not present

## 2022-09-01 ENCOUNTER — Encounter: Payer: Self-pay | Admitting: Nurse Practitioner

## 2022-09-01 ENCOUNTER — Ambulatory Visit: Payer: PPO | Attending: Nurse Practitioner | Admitting: Nurse Practitioner

## 2022-09-01 VITALS — BP 132/80 | HR 65 | Ht 77.0 in | Wt 281.0 lb

## 2022-09-01 DIAGNOSIS — R0683 Snoring: Secondary | ICD-10-CM

## 2022-09-01 DIAGNOSIS — R079 Chest pain, unspecified: Secondary | ICD-10-CM | POA: Diagnosis not present

## 2022-09-01 DIAGNOSIS — N184 Chronic kidney disease, stage 4 (severe): Secondary | ICD-10-CM | POA: Diagnosis not present

## 2022-09-01 DIAGNOSIS — E785 Hyperlipidemia, unspecified: Secondary | ICD-10-CM

## 2022-09-01 DIAGNOSIS — R0602 Shortness of breath: Secondary | ICD-10-CM

## 2022-09-01 DIAGNOSIS — I10 Essential (primary) hypertension: Secondary | ICD-10-CM | POA: Diagnosis not present

## 2022-09-01 DIAGNOSIS — N179 Acute kidney failure, unspecified: Secondary | ICD-10-CM

## 2022-09-01 MED ORDER — CARVEDILOL 6.25 MG PO TABS: 3.1250 mg | ORAL_TABLET | Freq: Two times a day (BID) | ORAL | 5 refills | Status: DC

## 2022-09-01 MED ORDER — ISOSORBIDE MONONITRATE ER 30 MG PO TB24: 45.0000 mg | ORAL_TABLET | Freq: Every day | ORAL | 3 refills | Status: DC

## 2022-09-01 NOTE — Progress Notes (Unsigned)
Office Visit    Patient Name: [name] Date of Encounter: 09/01/2022 PCP:  Golden Pop, FNP  [facility]  Cardiologist:  Dina Rich, MD  Advanced Practice Provider:  Sharlene Dory, NP Electrophysiologist:  None   Chief Complaint and HPI    [name] is a very pleasant 62 y.o. male with a hx of chest pain, hyperlipidemia, hypertension, type 2 diabetes, history of TIA, bradycardia, CKD stage IV, who presents today for follow-up.   Last seen by Dr. Dina Rich on September 16, 2021.  Longstanding history of chest pain, history of benign caths.  NST in 2022 was normal, low risk.  Treated for possible microvascular disease with Ranexa, no longer was on Imdur.  Dr. Wyline Mood recommended taking Ranexa 1000 mg twice daily.    Today he presents for follow-up.  He admits to 2 episodes of chest pain within the past week, not typically associated with exertion.  1 episode happened while sitting in recliner, stood up and stated it brought him to the floor, was laying there for about 10 minutes.  Second episode happened while walking in [employer], had to sit on some pallets before episode was resolved.  Describes chest pain as sharp, nonradiating, and denies any alleviating or aggravating factors, says all he wants to do during the spells is to lay down.  He says these episodes seems to be getting worse over time and does admit to stable DOE. Denies any palpitations, syncope, presyncope, dizziness, orthopnea, PND, swelling or significant weight changes, acute bleeding, or claudication. EKGs/Labs/Other Studies Reviewed:   The following studies were reviewed today:   EKG:  EKG not ordered today.    Echo 08/2022:  1. Left ventricular ejection fraction, by estimation, is 60 to 65%. The  left ventricle has normal function. The left ventricle has no regional  wall motion abnormalities. There is mild asymmetric left ventricular  hypertrophy of  the basal and septal  segments. Left ventricular diastolic parameters were normal.   2. Right ventricular systolic function is normal. The right ventricular  size is normal.   3. Left atrial size was mildly dilated.   4. The mitral valve is normal in structure. No evidence of mitral valve  regurgitation. No evidence of mitral stenosis.   5. The aortic valve is tricuspid. Aortic valve regurgitation is not  visualized. No aortic stenosis is present.   6. The inferior vena cava is normal in size with greater than 50%  respiratory variability, suggesting right atrial pressure of 3 mmHg.  Cardiac monitor 04/2022:   7 day monitor   Rare supraventricular ectopy in the form of isolated PACs, couplets. 1 run of SVT lasting 5 beats.   Ventricular ectopy in the form of isolated PVCs   No symptoms reported     Patch Wear Time:  6 days and 19 hours (2024-02-28T11:49:38-0500 to 2024-03-06T07:41:26-0500)   Patient had a min HR of 45 bpm, max HR of 124 bpm, and avg HR of 60 bpm. Predominant underlying rhythm was Sinus Rhythm. First Degree AV Block was present. 1 run of Supraventricular Tachycardia occurred lasting 5 beats with a max rate of 124 bpm (avg 117  bpm). Isolated SVEs were rare (<1.0%), SVE Couplets were rare (<1.0%), and no SVE Triplets were present. Isolated VEs were rare (<1.0%), and no VE Couplets or VE Triplets were present.  Myoview on 04/10/2020: There was no ST segment deviation noted during stress. The study is normal. Inferior defect most consistent  with subdiaphragmatic attenuation. Cannot completely exclude prior mild inferior infarct. Either finding would support low risk. This is a low risk study. The left ventricular ejection fraction is normal (55-65%).    Review of Systems    All other systems reviewed and are otherwise negative except as noted above.  Physical Exam    VS:  BP 132/80 (BP Location: Right Arm, Patient Position: Sitting, Cuff Size: Normal)   Pulse 65   Ht  6\' 5"  (1.956 m)   Wt 281 lb (127.5 kg)   SpO2 97%   BMI 33.32 kg/m  , BMI Body mass index is 33.32 kg/m.  Wt Readings from Last 3 Encounters:  09/01/22 281 lb (127.5 kg)  06/24/22 259 lb (117.5 kg)  05/30/22 279 lb 3.2 oz (126.6 kg)     GEN: Obese, 62 y.o. male in no acute distress. HEENT: normal. Neck: Supple, no JVD, carotid bruits, or masses. Cardiac: S1/S2, RRR, no murmurs, rubs, or gallops. No clubbing, cyanosis, edema.  Radials/ Right PT 2+.  Respiratory:  Respirations regular and unlabored, clear to auscultation bilaterally. GI: Soft, nontender, nondistended. MS: L BKA, no swelling.  Skin: Warm and dry, no rash. Neuro:  Strength and sensation are intact. Psych: Normal affect.  Assessment & Plan    Chest pain of uncertain etiology, dyspnea on exertion Recurrent episodes of chest pain not always associated with exertion, denies any recent active chest pain.  Admits to DOE.  History of normal cardiac catheterizations.  Reviewed most recent nuclear stress test in 2022, low risk.  Case previously discussed with Dr. Dina Rich, recommended medical management.  Will increase Imdur to 45 mg daily, therefore we will decrease carvedilol to 3.125 mg twice daily to help prevent hypotension.  Recent echocardiogram did not show regional wall motion abnormalities.  No other medication changes at this time.  Continue Ranexa.  ED precautions discussed. Heart healthy diet and regular cardiovascular exercise encouraged.   HTN BP on arrival 154/72, repeat BP 132/80. Discussed to monitor BP at home at least 2 hours after medications and sitting for 5-10 minutes.  Adjusting medications as mentioned above.  Continue rest of medication regimen. Heart healthy diet and regular cardiovascular exercise encouraged.   HLD LDL 10/2021 was 113. Continue atorvastatin.  Discussed/encouraged Mediterranean diet. Will obtain repeat labs (FLP and LFT)  to see if atorvastatin dosage needs to be increased. Heart  healthy diet and regular cardiovascular exercise encouraged. Continue to follow with PCP.  4. CKD Stage IV Last sCr was stable at 2.69 with eGFR at 26. Avoid nephrotoxic agents. Encouraged adequate hydration. Continue f/u with Nephrology and PCP.   5. Snoring Previous stop Bang score 6. Needs an sleep study done to be performed in sleep clinic.  Previously arranged pulmonology referral.  Continue to follow with PCP.  Disposition: Follow up in 4-6 weeks follow-up with Dina Rich, MD or APP.  Signed, Sharlene Dory, NP 09/04/2022, 7:34 AM [facility]

## 2022-09-01 NOTE — Patient Instructions (Addendum)
Medication Instructions:  Your physician has recommended you make the following change in your medication:  Reduce Carvedilol to 3.125 Mg BID Increase isosorbide Mononitrate to 45 Mg daily  Continue all other medications as prescribed.   Labwork: Labs in 2 weeks at [facility]   Testing/Procedures: None  Follow-Up: Your physician recommends that you schedule a follow-up appointment in: 4-6 weeks with Philis Nettle   Any Other Special Instructions Will Be Listed Below (If Applicable).  If you need a refill on your cardiac medications before your next appointment, please call your pharmacy.

## 2022-09-03 ENCOUNTER — Telehealth: Payer: Self-pay | Admitting: Nurse Practitioner

## 2022-09-03 NOTE — Telephone Encounter (Signed)
Pt c/o medication issue:  1. Name of Medication:   carvedilol (COREG) 6.25 MG tablet    isosorbide mononitrate (IMDUR) 30 MG 24 hr tablet     2. How are you currently taking this medication (dosage and times per day)?  Take 0.5 tablets (3.125 mg total) by mouth 2 (two) times daily.     Take 1.5 tablets (45 mg total) by mouth daily.        3. Are you having a reaction (difficulty breathing--STAT)? No  4. What is your medication issue? Olegario Messier from [facility] is requesting a callback to get verification on correct medication dosages since 2 different prescriptions for each medication has been sent over. Please advise

## 2022-09-03 NOTE — Telephone Encounter (Signed)
Verified with Pharmacy

## 2022-09-04 ENCOUNTER — Encounter: Payer: Self-pay | Admitting: Nurse Practitioner

## 2022-09-08 ENCOUNTER — Telehealth: Payer: Self-pay | Admitting: Cardiology

## 2022-09-08 NOTE — Telephone Encounter (Signed)
Patient is requesting to do a provider switch. Patient is requesting to switch from Dr. Wyline Mood to Dr. Jenene Slicker. Would both of you be okay with the provider switch?

## 2022-09-16 DIAGNOSIS — R0602 Shortness of breath: Secondary | ICD-10-CM | POA: Diagnosis not present

## 2022-09-16 DIAGNOSIS — Z794 Long term (current) use of insulin: Secondary | ICD-10-CM | POA: Diagnosis not present

## 2022-09-16 DIAGNOSIS — E1140 Type 2 diabetes mellitus with diabetic neuropathy, unspecified: Secondary | ICD-10-CM | POA: Diagnosis not present

## 2022-09-16 DIAGNOSIS — Z299 Encounter for prophylactic measures, unspecified: Secondary | ICD-10-CM | POA: Diagnosis not present

## 2022-09-16 DIAGNOSIS — E1165 Type 2 diabetes mellitus with hyperglycemia: Secondary | ICD-10-CM | POA: Diagnosis not present

## 2022-09-16 DIAGNOSIS — I771 Stricture of artery: Secondary | ICD-10-CM | POA: Diagnosis not present

## 2022-09-16 DIAGNOSIS — R071 Chest pain on breathing: Secondary | ICD-10-CM | POA: Diagnosis not present

## 2022-09-20 ENCOUNTER — Other Ambulatory Visit: Payer: Self-pay | Admitting: Nurse Practitioner

## 2022-09-24 DIAGNOSIS — Z79899 Other long term (current) drug therapy: Secondary | ICD-10-CM | POA: Diagnosis not present

## 2022-09-24 DIAGNOSIS — N179 Acute kidney failure, unspecified: Secondary | ICD-10-CM | POA: Diagnosis not present

## 2022-10-13 ENCOUNTER — Ambulatory Visit: Payer: PPO | Admitting: Nurse Practitioner

## 2022-10-13 ENCOUNTER — Ambulatory Visit: Payer: PPO | Attending: Cardiology | Admitting: Internal Medicine

## 2022-10-13 ENCOUNTER — Encounter: Payer: Self-pay | Admitting: Internal Medicine

## 2022-10-13 VITALS — BP 140/80 | HR 69 | Ht 77.0 in | Wt 276.2 lb

## 2022-10-13 DIAGNOSIS — N184 Chronic kidney disease, stage 4 (severe): Secondary | ICD-10-CM | POA: Diagnosis not present

## 2022-10-13 DIAGNOSIS — I509 Heart failure, unspecified: Secondary | ICD-10-CM

## 2022-10-13 DIAGNOSIS — I38 Endocarditis, valve unspecified: Secondary | ICD-10-CM | POA: Diagnosis not present

## 2022-10-13 MED ORDER — DILTIAZEM HCL ER 60 MG PO CP12: 60.0000 mg | ORAL_CAPSULE | Freq: Three times a day (TID) | ORAL | 1 refills | Status: DC

## 2022-10-13 MED ORDER — METOPROLOL TARTRATE 25 MG PO TABS: 25.0000 mg | ORAL_TABLET | Freq: Two times a day (BID) | ORAL | 2 refills | Status: AC

## 2022-10-13 NOTE — Patient Instructions (Addendum)
Medication Instructions:  Your physician has recommended you make the following change in your medication:  Stop taking Carvedilol   Start taking Metoprolol Tartrate 25 mg twice a day Start taking Diltiazem 60 mg every 8 hours Continue taking all other medications as prescribed  Labwork: BNP to be completed today at [facility] or [facility]  Testing/Procedures: None  Follow-Up: Your physician recommends that you schedule a follow-up appointment in: 1 month  Any Other Special Instructions Will Be Listed Below (If Applicable). Call the office in one week if chest pains are not any better.  If you need a refill on your cardiac medications before your next appointment, please call your pharmacy.

## 2022-10-20 ENCOUNTER — Telehealth: Payer: Self-pay | Admitting: Internal Medicine

## 2022-10-20 DIAGNOSIS — R079 Chest pain, unspecified: Secondary | ICD-10-CM

## 2022-10-20 NOTE — Telephone Encounter (Signed)
Pt c/o medication issue:  1. Name of Medication:   metoprolol tartrate (LOPRESSOR) 25 MG tablet  diltiazem (CARDIZEM SR) 60 MG 12 hr capsule   2. How are you currently taking this medication (dosage and times per day)? As prescribed   3. Are you having a reaction (difficulty breathing--STAT)?   4. What is your medication issue? Patient is requesting call back to discuss these medications and was told to call back on Monday if these medications did not help with the issues he was seen for last in office. Please advise.

## 2022-10-21 NOTE — Telephone Encounter (Signed)
Patient states since starting medication it has not helped his chest pain at all

## 2022-10-21 NOTE — Progress Notes (Signed)
Cardiology Office Note  Date: 10/21/2022   ID: SEGIO [name], DOB 28-Mar-1960, MRN [id number]  PCP:  Golden Pop, FNP  Cardiologist:  Marjo Bicker, MD Electrophysiologist:  None   History of Present Illness: [name] is a 62 y.o. male known to have HTN, DM 2, history of TIA, CKD stage IV is here for follow-up visit of chest pains.  Chronic chest pains, nuclear stress test in 2022 was normal, low risk study. He was treated for possible coronary microvascular dysfunction with multiple antianginals with no relief. He continues to have exertional chest pain occurring almost daily.  Associated with SOB.  No orthopnea, PND.  No dizziness, lightheadedness, syncope and palpitations.  No leg swelling.  Past Medical History:  Diagnosis Date   Allergic rhinitis, cause unspecified    Anginal pain (HCC)    Arthritis    BPH (benign prostatic hyperplasia)    Cervicalgia    Chest pain Sept. 2005   multiple caths  /  cath 06/15/2010..normal coronaries,  EF 60%   (false postive nuclear in the past)   CKD (chronic kidney disease), stage III (HCC)    Complication of anesthesia    Hard time waking up patient stated low blood pressures around 2017   Dyslipidemia    mixed   Esophageal reflux    Fibromyalgia    Gastroparesis    Headache(784.0)    History of kidney stones    Hyperlipidemia    Hypertension    Hypoplasia of one kidney    IDDM (insulin dependent diabetes mellitus)    Lumbago    Neuropathy associated with endocrine disorder (HCC)    Overweight(278.02)    Peripheral vascular disease (HCC)    Pulmonary embolus (HCC)    Hx of small left lower lobe  pulmonary embolus   Pulmonary embolus (HCC) 2010ish   Renal insufficiency    Stroke [facility])    mini stroke aug 2016   Type II or unspecified type diabetes mellitus with neurological manifestations, not stated as uncontrolled(250.60)    Urticaria, unspecified    Wears glasses     Past Surgical History:   Procedure Laterality Date   ACHILLES TENDON REPAIR Left 2013   AMPUTATION Left 06/02/2013   Procedure: AMPUTATION 1ST TOE LEFT FOOT;  Surgeon: Dallas Schimke, DPM;  Location: [facility];  Service: Orthopedics;  Laterality: Left;   AMPUTATION Left 07/21/2013   Procedure: PARTIAL AMPUTATION 2ND TOE LEFT FOOT;  Surgeon: Dallas Schimke, DPM;  Location: [facility];  Service: Podiatry;  Laterality: Left;   AMPUTATION Left 01/12/2019   Procedure: LEFT BELOW KNEE AMPUTATION;  Surgeon: Nadara Mustard, MD;  Location: [facility] OR;  Service: Orthopedics;  Laterality: Left;   ANTERIOR CERVICAL DECOMP/DISCECTOMY FUSION N/A 12/29/2016   Procedure: Cervical five-six Cervical six-seven Anterior cervical discectomy with fusion and plate fixation;  Surgeon: Ditty, Loura Halt, MD;  Location: [facility] OR;  Service: Neurosurgery;  Laterality: N/A;   APPENDECTOMY     BACK SURGERY  1999   x 6 some fusions   BELOW KNEE LEG AMPUTATION Left 01/2019   BIOPSY  12/18/2017   Procedure: BIOPSY;  Surgeon: Malissa Hippo, MD;  Location: [facility];  Service: Endoscopy;;  gastric and esophagus   colonscopy     ESOPHAGOGASTRODUODENOSCOPY (EGD) WITH PROPOFOL N/A 12/18/2017   Procedure: ESOPHAGOGASTRODUODENOSCOPY (EGD) WITH PROPOFOL;  Surgeon: Malissa Hippo, MD;  Location: [facility];  Service: Endoscopy;  Laterality: N/A;  9:25   FOOT ARTHRODESIS  Right 01/11/2014   Procedure: ARTHRODESIS INTERPHALANGEAL JOINT HALLUX RIGHT FOOT;  Surgeon: Dallas Schimke, DPM;  Location: [facility];  Service: Podiatry;  Laterality: Right;   HARDWARE REMOVAL Right 01/17/2015   Procedure: HARDWARE REMOVAL;  Surgeon: Ferman Hamming, DPM;  Location: [facility];  Service: Podiatry;  Laterality: Right;   I & D EXTREMITY Left 12/22/2018   Procedure: LEFT PARTIAL CALCANEAL EXCISION;  Surgeon: Nadara Mustard, MD;  Location: [facility] OR;  Service: Orthopedics;  Laterality: Left;   KNEE ARTHROSCOPY Right 10/2015   LUMBAR LAMINECTOMY/DECOMPRESSION  MICRODISCECTOMY Right 01/12/2013   Procedure: Right Lumbar Three-Four Laminotomy/Foraminotomy;  Surgeon: Karn Cassis, MD;  Location: [facility];  Service: Neurosurgery;  Laterality: Right;  Right Lumbar Three-Four Laminotomy/Foraminotomy   NISSEN FUNDOPLICATION     SHOULDER ARTHROSCOPY W/ ROTATOR CUFF REPAIR Right    SHOULDER ARTHROSCOPY WITH DISTAL CLAVICLE RESECTION Right 10/04/2018   Procedure: RIGHT SHOULDER ARTHROSCOPY WITH DISTAL CLAVICLE RESECTION, EXTENSIVE DEBRIDEMENT, SUBACROMIAL PARTIAL ACROMIOPLASTY,;  Surgeon: Frederico Hamman, MD;  Location: [facility];  Service: Orthopedics;  Laterality: Right;  PRE/POST OP SCALENE    Current Outpatient Medications  Medication Sig Dispense Refill   aspirin EC 81 MG tablet Take 1 tablet (81 mg total) by mouth daily. Swallow whole.     atorvastatin (LIPITOR) 40 MG tablet TAKE 1 TABLET BY MOUTH EVERY DAY 90 tablet 1   calcitRIOL (ROCALTROL) 0.25 MCG capsule Take 0.25 mcg by mouth 3 (three) times daily. 3 times weekly (M-W-F)     diltiazem (CARDIZEM SR) 60 MG 12 hr capsule Take 1 capsule (60 mg total) by mouth every 8 (eight) hours. 90 capsule 1   EPINEPHrine 0.3 mg/0.3 mL IJ SOAJ injection Inject 0.3 mg into the muscle as needed for anaphylaxis.     gabapentin (NEURONTIN) 600 MG tablet 1 tab by mouth every morning & 3 tabs at bedtime     glipiZIDE (GLUCOTROL) 10 MG tablet Take 20 mg by mouth daily.     insulin detemir (LEVEMIR) 100 UNIT/ML injection Inject 55 Units into the skin at bedtime.     isosorbide mononitrate (IMDUR) 30 MG 24 hr tablet Take 1.5 tablets (45 mg total) by mouth daily. 45 tablet 3   Lancets (ONETOUCH DELICA PLUS LANCET33G) MISC daily.     metoprolol tartrate (LOPRESSOR) 25 MG tablet Take 1 tablet (25 mg total) by mouth 2 (two) times daily. 60 tablet 2   nitroGLYCERIN (NITROSTAT) 0.4 MG SL tablet DISSOLVE 1 TABLET UNDER THE TONGUE EVERY 5 MINUTES AS NEEDED FOR CHEST PAIN. DO NOT EXCEED A TOTAL OF 3 DOSES IN 15  MINUTES. 25 tablet 3   NOVOLOG FLEXPEN 100 UNIT/ML FlexPen Inject into the skin as directed. Per sliding scale     ONETOUCH ULTRA test strip USE TO TEST BLOOD SUGAR THREE TIMES DAILY     ranolazine (RANEXA) 1000 MG SR tablet TAKE 1 TABLET BY MOUTH TWICE DAILY 180 tablet 3   silver sulfADIAZINE (SILVADENE) 1 % cream Apply pea-sized amount to wound daily. 50 g 0   sodium bicarbonate 650 MG tablet Take 650 mg by mouth 2 (two) times daily.      No current facility-administered medications for this visit.   Allergies:  Bee venom and Reglan [metoclopramide]   Social History: The patient  reports that he quit smoking about 40 years ago. His smoking use included cigarettes. He started smoking about 45 years ago. He has a 15.2 pack-year smoking history. He has never been exposed to tobacco smoke.  He has never used smokeless tobacco. He reports that he does not drink alcohol and does not use drugs.   Family History: The patient's family history includes Diabetes in his mother; Healthy in his brother; Heart attack (age of onset: 42) in his father; Heart attack (age of onset: 26) in his mother; Hyperlipidemia in his father and mother; Hypertension in his father and mother; Seizures in his brother.   ROS:  Please see the history of present illness. Otherwise, complete review of systems is positive for none  All other systems are reviewed and negative.   Physical Exam: VS:  BP (!) 140/80 (BP Location: Right Arm)   Pulse 69   Ht 6\' 5"  (1.956 m)   Wt 276 lb 3.2 oz (125.3 kg)   SpO2 97%   BMI 32.75 kg/m , BMI Body mass index is 32.75 kg/m.  Wt Readings from Last 3 Encounters:  10/13/22 276 lb 3.2 oz (125.3 kg)  09/01/22 281 lb (127.5 kg)  06/24/22 259 lb (117.5 kg)    General: Patient appears comfortable at rest. HEENT: Conjunctiva and lids normal, oropharynx clear with moist mucosa. Neck: Supple, no elevated JVP or carotid bruits, no thyromegaly. Lungs: Clear to auscultation, nonlabored breathing  at rest. Cardiac: Regular rate and rhythm, no S3 or significant systolic murmur, no pericardial rub. Abdomen: Soft, nontender, no hepatomegaly, bowel sounds present, no guarding or rebound. Extremities: No pitting edema, distal pulses 2+. Skin: Warm and dry. Musculoskeletal: No kyphosis. Neuropsychiatric: Alert and oriented x3, affect grossly appropriate.  Recent Labwork: No results found for requested labs within last 365 days.     Component Value Date/Time   CHOL 151 03/14/2017 1658   TRIG 111 03/14/2017 1658   HDL 33 (L) 03/14/2017 1658   CHOLHDL 4.6 03/14/2017 1658   VLDL 22 03/14/2017 1658   LDLCALC 96 03/14/2017 1658     Assessment and Plan:   Chronic chest pains HTN, controlled DM 2 History of TIA CKD stage IV  -Chronic longstanding chest pains, low risk NST in 2022. Previously on Imdur with no relief, discontinue Imdur and switch carvedilol to metoprolol tartrate 25 mg twice daily, add diltiazem 60 mg every 8 hours.  Patient is instructed to call the clinic if there is no relief with the above antianginals. Addendum: Patient called the clinic and reported no improvement in chest pains with the above medications. Obtain cardiac PET scan that showed no evidence of ischemia and normal myocardial blood flow reserve.  Low risk study overall. -Continue glipizide 20 mg once daily, insulin for diabetes mellitus management. -Follows up with nephrology for CKD stage IV management.      Medication Adjustments/Labs and Tests Ordered: Current medicines are reviewed at length with the patient today.  Concerns regarding medicines are outlined above.    Disposition:  Follow up  1 month   Signed Debby Clyne Verne Spurr, MD, 10/21/2022 12:04 PM    [facility] at [facility] [address] [place], [place], [place] [address]

## 2022-10-22 ENCOUNTER — Other Ambulatory Visit (HOSPITAL_COMMUNITY): Payer: Self-pay | Admitting: *Deleted

## 2022-10-22 ENCOUNTER — Encounter: Payer: Self-pay | Admitting: Internal Medicine

## 2022-10-22 ENCOUNTER — Telehealth (HOSPITAL_COMMUNITY): Payer: Self-pay | Admitting: *Deleted

## 2022-10-22 DIAGNOSIS — R079 Chest pain, unspecified: Secondary | ICD-10-CM

## 2022-10-22 NOTE — Addendum Note (Signed)
Addended by: Sharen Hones on: 10/22/2022 09:30 AM   Modules accepted: Orders

## 2022-10-22 NOTE — Telephone Encounter (Signed)
Reaching out to patient to offer assistance regarding upcoming cardiac imaging study; pt verbalizes understanding of appt date/time, parking situation and where to check in, pre-test NPO status and verified current allergies; name and call back number provided for further questions should they arise  Danette Weinfeld RN Navigator Cardiac Imaging [facility] [phone] office [phone] cell  Patient aware to avoid caffeine 12 hours prior to his cardiac PET scan. 

## 2022-10-22 NOTE — Telephone Encounter (Signed)
Test Ordered and patient stopping by the office to get paperwork and go over instructions

## 2022-10-23 ENCOUNTER — Encounter
Admission: RE | Admit: 2022-10-23 | Discharge: 2022-10-23 | Disposition: A | Discharge status: Home / Self Care | Payer: PPO | Source: Ambulatory Visit | Attending: Internal Medicine | Admitting: Internal Medicine

## 2022-10-23 DIAGNOSIS — R079 Chest pain, unspecified: Secondary | ICD-10-CM | POA: Diagnosis not present

## 2022-10-23 LAB — NM PET CT CARDIAC PERFUSION MULTI W/ABSOLUTE BLOODFLOW
LV dias vol: 130 mL (ref 62–150)
LV sys vol: 52 mL
MBFR: 2.3
Nuc Rest EF: 60 %
Nuc Stress EF: 65 %
Peak HR: 70 {beats}/min
Rest HR: 56 {beats}/min
Rest MBF: 0.63 ml/g/min
Rest Nuclear Isotope Dose: 24.9 mCi
SRS: 0
SSS: 0
ST Depression (mm): 0 mm
Stress MBF: 1.45 ml/g/min
Stress Nuclear Isotope Dose: 25 mCi
TID: 1.04

## 2022-10-23 MED ORDER — REGADENOSON 0.4 MG/5ML IV SOLN
0.4000 mg | Freq: Once | INTRAVENOUS | Status: AC
  Administered 2022-10-23: 0.4 mg via INTRAVENOUS
  Filled 2022-10-23: qty 5

## 2022-10-23 MED ORDER — RUBIDIUM RB82 GENERATOR (RUBYFILL)
25.0000 | PACK | Freq: Once | INTRAVENOUS | Status: AC
  Administered 2022-10-23: 25.04 via INTRAVENOUS

## 2022-10-23 MED ORDER — RUBIDIUM RB82 GENERATOR (RUBYFILL)
25.0000 | PACK | Freq: Once | INTRAVENOUS | Status: AC
  Administered 2022-10-23: 24.9 via INTRAVENOUS

## 2022-10-23 MED ORDER — REGADENOSON 0.4 MG/5ML IV SOLN
INTRAVENOUS | Status: AC
  Filled 2022-10-23: qty 5

## 2022-10-23 NOTE — Progress Notes (Signed)
Patient presents for a cardiac PET stress test and tolerated procedure without incident. Patient maintained acceptable vital signs throughout the test and was offered caffeine after test.  Patient ambulated out of department with a steady gait.

## 2022-10-24 DIAGNOSIS — N184 Chronic kidney disease, stage 4 (severe): Secondary | ICD-10-CM | POA: Insufficient documentation

## 2022-11-05 DIAGNOSIS — Z125 Encounter for screening for malignant neoplasm of prostate: Secondary | ICD-10-CM | POA: Diagnosis not present

## 2022-11-05 DIAGNOSIS — Z0000 Encounter for general adult medical examination without abnormal findings: Secondary | ICD-10-CM | POA: Diagnosis not present

## 2022-11-05 DIAGNOSIS — Z23 Encounter for immunization: Secondary | ICD-10-CM | POA: Diagnosis not present

## 2022-11-05 DIAGNOSIS — Z79899 Other long term (current) drug therapy: Secondary | ICD-10-CM | POA: Diagnosis not present

## 2022-11-05 DIAGNOSIS — Z299 Encounter for prophylactic measures, unspecified: Secondary | ICD-10-CM | POA: Diagnosis not present

## 2022-11-05 DIAGNOSIS — E7800 Pure hypercholesterolemia, unspecified: Secondary | ICD-10-CM | POA: Diagnosis not present

## 2022-11-05 DIAGNOSIS — R5383 Other fatigue: Secondary | ICD-10-CM | POA: Diagnosis not present

## 2022-11-06 ENCOUNTER — Telehealth: Payer: Self-pay | Admitting: Internal Medicine

## 2022-11-06 NOTE — Telephone Encounter (Signed)
Patient is requesting return call in regards to test results. Please advise.

## 2022-11-07 NOTE — Telephone Encounter (Signed)
Patient is following up due to not hearing back from anyone regarding results.

## 2022-11-10 ENCOUNTER — Ambulatory Visit: Payer: PPO | Admitting: Cardiology

## 2022-11-12 NOTE — Telephone Encounter (Signed)
Follow Up:      Patient said he was returning a call.

## 2022-11-12 NOTE — Telephone Encounter (Signed)
Patient informed and verbalized understanding of plan. 

## 2022-11-13 DIAGNOSIS — Z299 Encounter for prophylactic measures, unspecified: Secondary | ICD-10-CM | POA: Diagnosis not present

## 2022-11-13 DIAGNOSIS — E1140 Type 2 diabetes mellitus with diabetic neuropathy, unspecified: Secondary | ICD-10-CM | POA: Diagnosis not present

## 2022-11-13 DIAGNOSIS — R0602 Shortness of breath: Secondary | ICD-10-CM | POA: Diagnosis not present

## 2022-11-13 DIAGNOSIS — I739 Peripheral vascular disease, unspecified: Secondary | ICD-10-CM | POA: Diagnosis not present

## 2022-11-13 DIAGNOSIS — Z87891 Personal history of nicotine dependence: Secondary | ICD-10-CM | POA: Diagnosis not present

## 2022-11-17 ENCOUNTER — Ambulatory Visit: Payer: PPO | Attending: Internal Medicine | Admitting: Internal Medicine

## 2022-11-17 ENCOUNTER — Telehealth: Payer: Self-pay

## 2022-11-17 ENCOUNTER — Encounter: Payer: Self-pay | Admitting: Internal Medicine

## 2022-11-17 VITALS — BP 140/80 | HR 53 | Ht 77.0 in | Wt 277.8 lb

## 2022-11-17 DIAGNOSIS — E1159 Type 2 diabetes mellitus with other circulatory complications: Secondary | ICD-10-CM

## 2022-11-17 DIAGNOSIS — R079 Chest pain, unspecified: Secondary | ICD-10-CM

## 2022-11-17 DIAGNOSIS — N184 Chronic kidney disease, stage 4 (severe): Secondary | ICD-10-CM

## 2022-11-17 MED ORDER — LOSARTAN POTASSIUM 25 MG PO TABS: 25.0000 mg | ORAL_TABLET | Freq: Every day | ORAL | 2 refills | Status: DC

## 2022-11-17 NOTE — Progress Notes (Signed)
Cardiology Office Note  Date: 11/17/2022   ID: [name], DOB Apr 18, 1960, MRN [id number]  PCP:  Golden Pop, FNP  Cardiologist:  Marjo Bicker, MD Electrophysiologist:  None   History of Present Illness: [name] is a 62 y.o. male known to have HTN, DM 2, history of TIA, CKD stage IV is here for follow-up visit of chest pains.  Chronic chest pains, nuclear stress test in 2022 was normal, low risk study. He was treated for possible coronary microvascular dysfunction with multiple antianginals with no relief. He continues to have exertional chest pain occurring almost daily.  Associated with SOB.  He underwent cardiac PET scan in 10/2022 that showed normal study, no ischemia, low risk study. Myocardial blood flow was computed to be 0.87ml/g/min at rest and 1.40ml/g/min at stress. Global myocardial blood flow reserve was 2.30 and was normal.  He is here for follow-up visit.  He continues to have exertional chest pain, none of the current antianginals have provided him any relief.  His PCP started him on albuterol for SOB and wants to try if this will resolve his chest pains.  He also has some difficulty swallowing for which I informed him to check with GI.  No dizziness, presyncope and syncope.  No palpitations.  No leg swelling.   Past Medical History:  Diagnosis Date   Allergic rhinitis, cause unspecified    Anginal pain (HCC)    Arthritis    BPH (benign prostatic hyperplasia)    Cervicalgia    Chest pain Sept. 2005   multiple caths  /  cath 06/15/2010..normal coronaries,  EF 60%   (false postive nuclear in the past)   CKD (chronic kidney disease), stage III (HCC)    Complication of anesthesia    Hard time waking up patient stated low blood pressures around 2017   Dyslipidemia    mixed   Esophageal reflux    Fibromyalgia    Gastroparesis    Headache(784.0)    History of kidney stones    Hyperlipidemia    Hypertension    Hypoplasia of one kidney     IDDM (insulin dependent diabetes mellitus)    Lumbago    Neuropathy associated with endocrine disorder (HCC)    Overweight(278.02)    Peripheral vascular disease (HCC)    Pulmonary embolus (HCC)    Hx of small left lower lobe  pulmonary embolus   Pulmonary embolus (HCC) 2010ish   Renal insufficiency    Stroke [facility])    mini stroke aug 2016   Type II or unspecified type diabetes mellitus with neurological manifestations, not stated as uncontrolled(250.60)    Urticaria, unspecified    Wears glasses     Past Surgical History:  Procedure Laterality Date   ACHILLES TENDON REPAIR Left 2013   AMPUTATION Left 06/02/2013   Procedure: AMPUTATION 1ST TOE LEFT FOOT;  Surgeon: Dallas Schimke, DPM;  Location: [facility];  Service: Orthopedics;  Laterality: Left;   AMPUTATION Left 07/21/2013   Procedure: PARTIAL AMPUTATION 2ND TOE LEFT FOOT;  Surgeon: Dallas Schimke, DPM;  Location: [facility];  Service: Podiatry;  Laterality: Left;   AMPUTATION Left 01/12/2019   Procedure: LEFT BELOW KNEE AMPUTATION;  Surgeon: Nadara Mustard, MD;  Location: [facility] OR;  Service: Orthopedics;  Laterality: Left;   ANTERIOR CERVICAL DECOMP/DISCECTOMY FUSION N/A 12/29/2016   Procedure: Cervical five-six Cervical six-seven Anterior cervical discectomy with fusion and plate fixation;  Surgeon: Ditty, Loura Halt, MD;  Location: [facility] OR;  Service: Neurosurgery;  Laterality: N/A;   APPENDECTOMY     BACK SURGERY  1999   x 6 some fusions   BELOW KNEE LEG AMPUTATION Left 01/2019   BIOPSY  12/18/2017   Procedure: BIOPSY;  Surgeon: Malissa Hippo, MD;  Location: [facility];  Service: Endoscopy;;  gastric and esophagus   colonscopy     ESOPHAGOGASTRODUODENOSCOPY (EGD) WITH PROPOFOL N/A 12/18/2017   Procedure: ESOPHAGOGASTRODUODENOSCOPY (EGD) WITH PROPOFOL;  Surgeon: Malissa Hippo, MD;  Location: [facility];  Service: Endoscopy;  Laterality: N/A;  9:25   FOOT ARTHRODESIS Right 01/11/2014   Procedure: ARTHRODESIS  INTERPHALANGEAL JOINT HALLUX RIGHT FOOT;  Surgeon: Dallas Schimke, DPM;  Location: [facility];  Service: Podiatry;  Laterality: Right;   HARDWARE REMOVAL Right 01/17/2015   Procedure: HARDWARE REMOVAL;  Surgeon: Ferman Hamming, DPM;  Location: [facility];  Service: Podiatry;  Laterality: Right;   I & D EXTREMITY Left 12/22/2018   Procedure: LEFT PARTIAL CALCANEAL EXCISION;  Surgeon: Nadara Mustard, MD;  Location: [facility] OR;  Service: Orthopedics;  Laterality: Left;   KNEE ARTHROSCOPY Right 10/2015   LUMBAR LAMINECTOMY/DECOMPRESSION MICRODISCECTOMY Right 01/12/2013   Procedure: Right Lumbar Three-Four Laminotomy/Foraminotomy;  Surgeon: Karn Cassis, MD;  Location: [facility];  Service: Neurosurgery;  Laterality: Right;  Right Lumbar Three-Four Laminotomy/Foraminotomy   NISSEN FUNDOPLICATION     SHOULDER ARTHROSCOPY W/ ROTATOR CUFF REPAIR Right    SHOULDER ARTHROSCOPY WITH DISTAL CLAVICLE RESECTION Right 10/04/2018   Procedure: RIGHT SHOULDER ARTHROSCOPY WITH DISTAL CLAVICLE RESECTION, EXTENSIVE DEBRIDEMENT, SUBACROMIAL PARTIAL ACROMIOPLASTY,;  Surgeon: Frederico Hamman, MD;  Location: [facility];  Service: Orthopedics;  Laterality: Right;  PRE/POST OP SCALENE    Current Outpatient Medications  Medication Sig Dispense Refill   aspirin EC 81 MG tablet Take 1 tablet (81 mg total) by mouth daily. Swallow whole.     atorvastatin (LIPITOR) 40 MG tablet TAKE 1 TABLET BY MOUTH EVERY DAY 90 tablet 1   calcitRIOL (ROCALTROL) 0.25 MCG capsule Take 0.25 mcg by mouth 3 (three) times daily. 3 times weekly (M-W-F)     diltiazem (CARDIZEM SR) 60 MG 12 hr capsule Take 1 capsule (60 mg total) by mouth every 8 (eight) hours. 90 capsule 1   EPINEPHrine 0.3 mg/0.3 mL IJ SOAJ injection Inject 0.3 mg into the muscle as needed for anaphylaxis.     gabapentin (NEURONTIN) 600 MG tablet 1 tab by mouth every morning & 3 tabs at bedtime     glipiZIDE (GLUCOTROL) 10 MG tablet Take 20 mg by mouth daily.      insulin detemir (LEVEMIR) 100 UNIT/ML injection Inject 55 Units into the skin at bedtime.     isosorbide mononitrate (IMDUR) 30 MG 24 hr tablet Take 1.5 tablets (45 mg total) by mouth daily. 45 tablet 3   Lancets (ONETOUCH DELICA PLUS LANCET33G) MISC daily.     metoprolol tartrate (LOPRESSOR) 25 MG tablet Take 1 tablet (25 mg total) by mouth 2 (two) times daily. 60 tablet 2   nitroGLYCERIN (NITROSTAT) 0.4 MG SL tablet DISSOLVE 1 TABLET UNDER THE TONGUE EVERY 5 MINUTES AS NEEDED FOR CHEST PAIN. DO NOT EXCEED A TOTAL OF 3 DOSES IN 15 MINUTES. 25 tablet 3   NOVOLOG FLEXPEN 100 UNIT/ML FlexPen Inject into the skin as directed. Per sliding scale     ONETOUCH ULTRA test strip USE TO TEST BLOOD SUGAR THREE TIMES DAILY     ranolazine (RANEXA) 1000 MG SR tablet TAKE 1 TABLET BY MOUTH TWICE DAILY  180 tablet 3   silver sulfADIAZINE (SILVADENE) 1 % cream Apply pea-sized amount to wound daily. 50 g 0   sodium bicarbonate 650 MG tablet Take 650 mg by mouth 2 (two) times daily.      No current facility-administered medications for this visit.   Allergies:  Bee venom and Reglan [metoclopramide]   Social History: The patient  reports that he quit smoking about 40 years ago. His smoking use included cigarettes. He started smoking about 45 years ago. He has a 15.2 pack-year smoking history. He has never been exposed to tobacco smoke. He has never used smokeless tobacco. He reports that he does not drink alcohol and does not use drugs.   Family History: The patient's family history includes Diabetes in his mother; Healthy in his brother; Heart attack (age of onset: 51) in his father; Heart attack (age of onset: 51) in his mother; Hyperlipidemia in his father and mother; Hypertension in his father and mother; Seizures in his brother.   ROS:  Please see the history of present illness. Otherwise, complete review of systems is positive for none  All other systems are reviewed and negative.   Physical Exam: VS:   There were no vitals taken for this visit., BMI There is no height or weight on file to calculate BMI.  Wt Readings from Last 3 Encounters:  10/23/22 264 lb (119.7 kg)  10/13/22 276 lb 3.2 oz (125.3 kg)  09/01/22 281 lb (127.5 kg)    General: Patient appears comfortable at rest. HEENT: Conjunctiva and lids normal, oropharynx clear with moist mucosa. Neck: Supple, no elevated JVP or carotid bruits, no thyromegaly. Lungs: Clear to auscultation, nonlabored breathing at rest. Cardiac: Regular rate and rhythm, no S3 or significant systolic murmur, no pericardial rub. Abdomen: Soft, nontender, no hepatomegaly, bowel sounds present, no guarding or rebound. Extremities: No pitting edema, distal pulses 2+. Skin: Warm and dry. Musculoskeletal: No kyphosis. Neuropsychiatric: Alert and oriented x3, affect grossly appropriate.  Recent Labwork: No results found for requested labs within last 365 days.     Component Value Date/Time   CHOL 151 03/14/2017 1658   TRIG 111 03/14/2017 1658   HDL 33 (L) 03/14/2017 1658   CHOLHDL 4.6 03/14/2017 1658   VLDL 22 03/14/2017 1658   LDLCALC 96 03/14/2017 1658     Assessment and Plan:   Chronic chest pains HTN, controlled DM 2 History of TIA CKD stage IV  -Chronic longstanding chest pains, low risk NST in 2022.  Cardiac PET scan in 9/24 showed no ischemia, normal study, low risk study.  Continues to have exertional angina, none of the current antianginals are helping him.  Chest pains are unlikely to be cardiac in origin. Will discontinue ranolazine, diltiazem, isosorbide mononitrate.  Continue metoprolol tartrate 25 mg twice daily. -Continue cardioprotective medications with aspirin 81 mg once daily and atorvastatin 40 mg nightly due to history of TIA in the past, goal LDL less than 70. -Continue glipizide 20 mg once daily, insulin for diabetes mellitus management. -Follows with nephrology for CKD stage IV management..  I have spent a total of 30  minutes reviewing the prior test, reviewing medications and updating the list, face-to-face counseling of his medical condition, reviewing the treatment options and documentation of the findings.      Medication Adjustments/Labs and Tests Ordered: Current medicines are reviewed at length with the patient today.  Concerns regarding medicines are outlined above.    Disposition:  Follow up  1 year   Signed Nene Aranas  Verne Spurr, MD, 11/17/2022 8:57 AM    [facility] [address] [place], [place], [place] [address]

## 2022-11-17 NOTE — Patient Instructions (Addendum)
Medication Instructions:  Your physician has recommended you make the following change in your medication:  Stop taking Ranolazine, Imdur and Diltiazem Start taking Losartan 25 mg once daily Continue taking all other medications as prescribed  Labwork: None  Testing/Procedures: None  Follow-Up: Your physician recommends that you schedule a follow-up appointment in: 1 year. You will receive a reminder call in about 8 months reminding you to schedule your appointment. If you don't receive this call, please contact our office.   Any Other Special Instructions Will Be Listed Below (If Applicable).  Thank you for choosing [facility]!      If you need a refill on your cardiac medications before your next appointment, please call your pharmacy.

## 2022-11-17 NOTE — Telephone Encounter (Signed)
Dr. Jenene Slicker made changes to plan after visit: I discontinued his losartan, could you let the patient know to follow-up with his kidney doctor for initiation of losartan.  Called patient explained changed and verbalized understanding. Patient will be seeing his kidney doctor on next month.

## 2022-11-20 ENCOUNTER — Other Ambulatory Visit: Payer: Self-pay | Admitting: Cardiology

## 2022-11-28 ENCOUNTER — Encounter (INDEPENDENT_AMBULATORY_CARE_PROVIDER_SITE_OTHER): Payer: Self-pay | Admitting: *Deleted

## 2022-12-04 DIAGNOSIS — N1832 Chronic kidney disease, stage 3b: Secondary | ICD-10-CM | POA: Diagnosis not present

## 2022-12-08 DIAGNOSIS — I700 Atherosclerosis of aorta: Secondary | ICD-10-CM | POA: Diagnosis not present

## 2022-12-08 DIAGNOSIS — R972 Elevated prostate specific antigen [PSA]: Secondary | ICD-10-CM | POA: Diagnosis not present

## 2022-12-08 DIAGNOSIS — E1140 Type 2 diabetes mellitus with diabetic neuropathy, unspecified: Secondary | ICD-10-CM | POA: Diagnosis not present

## 2022-12-08 DIAGNOSIS — E1165 Type 2 diabetes mellitus with hyperglycemia: Secondary | ICD-10-CM | POA: Diagnosis not present

## 2022-12-08 DIAGNOSIS — Z299 Encounter for prophylactic measures, unspecified: Secondary | ICD-10-CM | POA: Diagnosis not present

## 2022-12-11 DIAGNOSIS — I700 Atherosclerosis of aorta: Secondary | ICD-10-CM | POA: Diagnosis not present

## 2022-12-11 DIAGNOSIS — R0602 Shortness of breath: Secondary | ICD-10-CM | POA: Diagnosis not present

## 2022-12-22 DIAGNOSIS — N189 Chronic kidney disease, unspecified: Secondary | ICD-10-CM | POA: Diagnosis not present

## 2022-12-30 DIAGNOSIS — M79641 Pain in right hand: Secondary | ICD-10-CM | POA: Diagnosis not present

## 2023-01-01 DIAGNOSIS — Z743 Need for continuous supervision: Secondary | ICD-10-CM | POA: Diagnosis not present

## 2023-01-01 DIAGNOSIS — I499 Cardiac arrhythmia, unspecified: Secondary | ICD-10-CM | POA: Diagnosis not present

## 2023-01-01 DIAGNOSIS — R0902 Hypoxemia: Secondary | ICD-10-CM | POA: Diagnosis not present

## 2023-01-04 DEATH — deceased
